# Patient Record
Sex: Female | Born: 1965 | Race: White | Hispanic: No | Marital: Married | Smoking: Never smoker
Health system: Southern US, Community
[De-identification: ages and names within clinical notes are randomized; demographics above are authoritative.]

## PROBLEM LIST (undated history)

## (undated) DIAGNOSIS — C50919 Malignant neoplasm of unspecified site of unspecified female breast: Secondary | ICD-10-CM

## (undated) DIAGNOSIS — F419 Anxiety disorder, unspecified: Secondary | ICD-10-CM

## (undated) DIAGNOSIS — R7303 Prediabetes: Secondary | ICD-10-CM

## (undated) DIAGNOSIS — M810 Age-related osteoporosis without current pathological fracture: Secondary | ICD-10-CM

## (undated) DIAGNOSIS — Z87442 Personal history of urinary calculi: Secondary | ICD-10-CM

## (undated) DIAGNOSIS — K219 Gastro-esophageal reflux disease without esophagitis: Secondary | ICD-10-CM

## (undated) DIAGNOSIS — C801 Malignant (primary) neoplasm, unspecified: Secondary | ICD-10-CM

## (undated) DIAGNOSIS — K5790 Diverticulosis of intestine, part unspecified, without perforation or abscess without bleeding: Secondary | ICD-10-CM

## (undated) DIAGNOSIS — J189 Pneumonia, unspecified organism: Secondary | ICD-10-CM

## (undated) DIAGNOSIS — D649 Anemia, unspecified: Secondary | ICD-10-CM

## (undated) DIAGNOSIS — G43909 Migraine, unspecified, not intractable, without status migrainosus: Secondary | ICD-10-CM

## (undated) DIAGNOSIS — K5792 Diverticulitis of intestine, part unspecified, without perforation or abscess without bleeding: Secondary | ICD-10-CM

## (undated) DIAGNOSIS — I82409 Acute embolism and thrombosis of unspecified deep veins of unspecified lower extremity: Secondary | ICD-10-CM

## (undated) DIAGNOSIS — N200 Calculus of kidney: Secondary | ICD-10-CM

## (undated) DIAGNOSIS — D689 Coagulation defect, unspecified: Secondary | ICD-10-CM

## (undated) DIAGNOSIS — E119 Type 2 diabetes mellitus without complications: Secondary | ICD-10-CM

## (undated) HISTORY — DX: Diverticulosis of intestine, part unspecified, without perforation or abscess without bleeding: K57.90

## (undated) HISTORY — DX: Coagulation defect, unspecified: D68.9

## (undated) HISTORY — DX: Migraine, unspecified, not intractable, without status migrainosus: G43.909

## (undated) HISTORY — PX: BREAST ENHANCEMENT SURGERY: SHX7

## (undated) HISTORY — DX: Malignant neoplasm of unspecified site of unspecified female breast: C50.919

## (undated) HISTORY — DX: Age-related osteoporosis without current pathological fracture: M81.0

## (undated) HISTORY — DX: Calculus of kidney: N20.0

## (undated) HISTORY — DX: Pneumonia, unspecified organism: J18.9

## (undated) HISTORY — DX: Diverticulitis of intestine, part unspecified, without perforation or abscess without bleeding: K57.92

## (undated) HISTORY — DX: Prediabetes: R73.03

## (undated) HISTORY — DX: Type 2 diabetes mellitus without complications: E11.9

## (undated) HISTORY — PX: BREAST SURGERY: SHX581

---

## 2000-04-02 HISTORY — PX: UMBILICAL HERNIA REPAIR: SHX196

## 2000-04-02 HISTORY — PX: HERNIA REPAIR: SHX51

## 2003-04-03 HISTORY — PX: BUNIONECTOMY: SHX129

## 2003-12-22 ENCOUNTER — Encounter: Admission: RE | Admit: 2003-12-22 | Discharge: 2003-12-22 | Payer: Self-pay | Admitting: Gastroenterology

## 2015-07-26 DIAGNOSIS — H5213 Myopia, bilateral: Secondary | ICD-10-CM | POA: Diagnosis not present

## 2015-09-22 DIAGNOSIS — K210 Gastro-esophageal reflux disease with esophagitis: Secondary | ICD-10-CM | POA: Diagnosis not present

## 2015-12-06 ENCOUNTER — Ambulatory Visit (INDEPENDENT_AMBULATORY_CARE_PROVIDER_SITE_OTHER): Payer: BLUE CROSS/BLUE SHIELD | Admitting: Physician Assistant

## 2015-12-06 ENCOUNTER — Encounter: Payer: Self-pay | Admitting: Physician Assistant

## 2015-12-06 VITALS — BP 125/80 | HR 73 | Temp 97.9°F | Ht 64.0 in | Wt 130.7 lb

## 2015-12-06 DIAGNOSIS — K9041 Non-celiac gluten sensitivity: Secondary | ICD-10-CM | POA: Insufficient documentation

## 2015-12-06 DIAGNOSIS — K219 Gastro-esophageal reflux disease without esophagitis: Secondary | ICD-10-CM | POA: Insufficient documentation

## 2015-12-06 DIAGNOSIS — J029 Acute pharyngitis, unspecified: Secondary | ICD-10-CM

## 2015-12-06 MED ORDER — AMOXICILLIN 500 MG PO CAPS: 1000.0000 mg | ORAL_CAPSULE | Freq: Two times a day (BID) | ORAL | 0 refills | Status: DC

## 2015-12-06 NOTE — Patient Instructions (Signed)

## 2015-12-06 NOTE — Progress Notes (Signed)
BP 125/80   Pulse 73   Temp 97.9 F (36.6 C) (Oral)   Ht 5\' 4"  (1.626 m)   Wt 130 lb 11.2 oz (59.3 kg)   BMI 22.43 kg/m    Subjective:    Patient ID: Stephanie Brewer, female    DOB: Apr 13, 1965, 50 y.o.   MRN: [id number]  HPI: Stephanie Brewer is a 50 y.o. female presenting on 12/06/2015 for Sore Throat; Ear Pain; and New Patient (Initial Visit) Patient here to be established as new patient at [facility].  This patient is known to me from [facility]. She has had this pharyngitis symptoms for over 3 days. She has a history of strep in the past. She gets this approximately 2 or 3 times a year. She denies any severe fever. There is significant amount of pain that radiates to the left ear. She states that it has hurt to sleep on her left side for several nights. She states she has had some postnasal drainage and sinus drainage. She denies any nausea vomiting or diarrhea. She also will be seen Dr. Britta Mccreedy soon for a GI checkup and probable scopes   Relevant past medical, surgical, family and social history reviewed and updated as indicated. Interim medical history since our last visit reviewed. Allergies and medications reviewed and updated. DATA REVIEWED: CHART IN EPIC  Review of Systems  Constitutional: Negative.  Negative for activity change, fatigue and fever.  HENT: Positive for ear pain, postnasal drip and sore throat.   Eyes: Negative.   Respiratory: Negative.  Negative for cough and wheezing.   Cardiovascular: Negative.  Negative for chest pain.  Gastrointestinal: Negative.  Negative for abdominal pain.  Endocrine: Negative.   Genitourinary: Negative.  Negative for dysuria.  Musculoskeletal: Negative.   Skin: Negative.   Neurological: Negative.     Per HPI unless specifically indicated above     Medication List       Accurate as of 12/06/15  4:49 PM. Always use your most recent med list.          amoxicillin 500 MG capsule Commonly  known as:  AMOXIL Take 2 capsules (1,000 mg total) by mouth 2 (two) times daily.          Objective:    BP 125/80   Pulse 73   Temp 97.9 F (36.6 C) (Oral)   Ht 5\' 4"  (1.626 m)   Wt 130 lb 11.2 oz (59.3 kg)   BMI 22.43 kg/m   Allergies  Allergen Reactions  . Caffeine     Rapid heart rate.  . Doxycycline     Racing heart.  Marland Kitchen Nexium [Esomeprazole]     Swollen throat    Wt Readings from Last 3 Encounters:  12/06/15 130 lb 11.2 oz (59.3 kg)    Physical Exam  Constitutional: She is oriented to person, place, and time. She appears well-developed and well-nourished.  HENT:  Head: Normocephalic and atraumatic.  Right Ear: Tympanic membrane, external ear and ear canal normal.  Left Ear: Ear canal normal. There is tenderness. A middle ear effusion is present.  Nose: Mucosal edema and rhinorrhea present. No nasal deformity.  Mouth/Throat: Uvula is midline and mucous membranes are normal. Posterior oropharyngeal erythema present.  Eyes: Conjunctivae and EOM are normal. Pupils are equal, round, and reactive to light.  Cardiovascular: Normal rate, regular rhythm, normal heart sounds and intact distal pulses.   Pulmonary/Chest: Effort normal and breath sounds normal.  Abdominal: Soft. Bowel sounds  are normal.  Neurological: She is alert and oriented to person, place, and time. She has normal reflexes.  Skin: Skin is warm and dry. No rash noted.  Psychiatric: She has a normal mood and affect. Her behavior is normal. Judgment and thought content normal.    No results found for this or any previous visit.    Assessment & Plan:   1. Acute pharyngitis, unspecified etiology Tylenol for pain or fever - amoxicillin (AMOXIL) 500 MG capsule; Take 2 capsules (1,000 mg total) by mouth 2 (two) times daily.  Dispense: 30 capsule; Refill: 0   Continue all other maintenance medications as listed above.  Follow up plan: No Follow-up on file.  Counseling provided for all of the age  appropriate vaccines. No orders of the defined types were placed in this encounter.   Terald Sleeper PA-C [facility] [address]  [place], [place] [address] [phone]   12/06/2015, 4:49 PM

## 2015-12-19 DIAGNOSIS — R1013 Epigastric pain: Secondary | ICD-10-CM | POA: Diagnosis not present

## 2015-12-30 DIAGNOSIS — Z91018 Allergy to other foods: Secondary | ICD-10-CM | POA: Diagnosis not present

## 2015-12-30 DIAGNOSIS — Z8489 Family history of other specified conditions: Secondary | ICD-10-CM | POA: Diagnosis not present

## 2015-12-30 DIAGNOSIS — Z79899 Other long term (current) drug therapy: Secondary | ICD-10-CM | POA: Diagnosis not present

## 2015-12-30 DIAGNOSIS — Z8249 Family history of ischemic heart disease and other diseases of the circulatory system: Secondary | ICD-10-CM | POA: Diagnosis not present

## 2015-12-30 DIAGNOSIS — Z91011 Allergy to milk products: Secondary | ICD-10-CM | POA: Diagnosis not present

## 2015-12-30 DIAGNOSIS — Z883 Allergy status to other anti-infective agents status: Secondary | ICD-10-CM | POA: Diagnosis not present

## 2015-12-30 DIAGNOSIS — R1013 Epigastric pain: Secondary | ICD-10-CM | POA: Diagnosis not present

## 2015-12-30 DIAGNOSIS — Z888 Allergy status to other drugs, medicaments and biological substances status: Secondary | ICD-10-CM | POA: Diagnosis not present

## 2016-01-16 ENCOUNTER — Encounter: Payer: Self-pay | Admitting: Physician Assistant

## 2016-01-30 DIAGNOSIS — R1013 Epigastric pain: Secondary | ICD-10-CM | POA: Diagnosis not present

## 2016-02-06 DIAGNOSIS — B079 Viral wart, unspecified: Secondary | ICD-10-CM | POA: Diagnosis not present

## 2016-04-03 DIAGNOSIS — L820 Inflamed seborrheic keratosis: Secondary | ICD-10-CM | POA: Diagnosis not present

## 2016-04-03 DIAGNOSIS — B079 Viral wart, unspecified: Secondary | ICD-10-CM | POA: Diagnosis not present

## 2016-04-03 DIAGNOSIS — L700 Acne vulgaris: Secondary | ICD-10-CM | POA: Diagnosis not present

## 2016-05-24 DIAGNOSIS — Z1231 Encounter for screening mammogram for malignant neoplasm of breast: Secondary | ICD-10-CM | POA: Diagnosis not present

## 2016-05-24 DIAGNOSIS — Z9882 Breast implant status: Secondary | ICD-10-CM | POA: Diagnosis not present

## 2016-07-05 DIAGNOSIS — H5213 Myopia, bilateral: Secondary | ICD-10-CM | POA: Diagnosis not present

## 2017-01-15 ENCOUNTER — Ambulatory Visit (INDEPENDENT_AMBULATORY_CARE_PROVIDER_SITE_OTHER): Payer: BLUE CROSS/BLUE SHIELD

## 2017-01-15 ENCOUNTER — Ambulatory Visit (INDEPENDENT_AMBULATORY_CARE_PROVIDER_SITE_OTHER): Payer: BLUE CROSS/BLUE SHIELD | Admitting: Physician Assistant

## 2017-01-15 ENCOUNTER — Encounter: Payer: Self-pay | Admitting: Physician Assistant

## 2017-01-15 VITALS — BP 120/82 | HR 75 | Temp 98.0°F | Ht 64.0 in | Wt 162.2 lb

## 2017-01-15 DIAGNOSIS — M25561 Pain in right knee: Secondary | ICD-10-CM | POA: Diagnosis not present

## 2017-01-15 DIAGNOSIS — Z0000 Encounter for general adult medical examination without abnormal findings: Secondary | ICD-10-CM

## 2017-01-15 DIAGNOSIS — R55 Syncope and collapse: Secondary | ICD-10-CM | POA: Diagnosis not present

## 2017-01-15 DIAGNOSIS — L237 Allergic contact dermatitis due to plants, except food: Secondary | ICD-10-CM

## 2017-01-15 MED ORDER — CLOBETASOL PROPIONATE 0.05 % EX CREA: 1.0000 "application " | TOPICAL_CREAM | Freq: Two times a day (BID) | CUTANEOUS | 0 refills | Status: DC

## 2017-01-15 NOTE — Patient Instructions (Signed)
In a few days you may receive a survey in the mail or online from [employer] regarding your visit with us today. Please take a moment to fill this out. Your feedback is very important to our whole office. It can help us better understand your needs as well as improve your experience and satisfaction. Thank you for taking your time to complete it. We care about you.  Willett Lefeber, PA-C  

## 2017-01-15 NOTE — Progress Notes (Signed)
BP 120/82   Pulse 75   Temp 98 F (36.7 C) (Oral)   Ht 5' 4"  (1.626 m)   Wt 162 lb 3.2 oz (73.6 kg)   BMI 27.84 kg/m    Subjective:    Patient ID: Stephanie Brewer, female    DOB: 05/14/1965, 51 y.o.   MRN: [id number]  HPI: Stephanie Brewer is a 51 y.o. female presenting on 01/15/2017 for Fall (3 weeks ago hurt right knee)  There is ago the patient found herself falling on her driveway. She notes that she remembered standing and then she woke up having pain in her right knee right shoulder right palm. She had a significant amount of bruising. The knee has been extremely swollen and painful at times. She has tried to nurse it with over-the-counter medications, ice and compression. But has continued to hurt. Plus she was concerned about why she had syncope. She had eaten that day. She does not remember having any palpitations or chest pain. She overall is very healthy and has not had any complaints. We will draw some labs today.  Relevant past medical, surgical, family and social history reviewed and updated as indicated. Allergies and medications reviewed and updated.  History reviewed. No pertinent past medical history.  Past Surgical History:  Procedure Laterality Date  . BREAST SURGERY     Augmentation 2003  . HERNIA REPAIR  2002    Review of Systems  Constitutional: Negative.  Negative for activity change, fatigue and fever.  HENT: Negative.   Eyes: Negative.   Respiratory: Negative.  Negative for cough.   Cardiovascular: Negative.  Negative for chest pain.  Gastrointestinal: Negative.  Negative for abdominal pain.  Endocrine: Negative.   Genitourinary: Negative.  Negative for dysuria.  Musculoskeletal: Positive for arthralgias, joint swelling and myalgias.  Skin: Negative.   Neurological: Positive for syncope. Negative for dizziness, tremors, seizures, speech difficulty and weakness.    Allergies as of 01/15/2017      Reactions   Hyoscyamine Anaphylaxis   Caffeine    Rapid heart rate.   Doxycycline    Racing heart.   Nexium [esomeprazole]    Swollen throat      Medication List       Accurate as of 01/15/17 11:17 AM. Always use your most recent med list.          clobetasol cream 0.05 % Commonly known as:  TEMOVATE Apply 1 application topically 2 (two) times daily.          Objective:    BP 120/82   Pulse 75   Temp 98 F (36.7 C) (Oral)   Ht 5' 4"  (1.626 m)   Wt 162 lb 3.2 oz (73.6 kg)   BMI 27.84 kg/m   Allergies  Allergen Reactions  . Hyoscyamine Anaphylaxis  . Caffeine     Rapid heart rate.  . Doxycycline     Racing heart.  Marland Kitchen Nexium [Esomeprazole]     Swollen throat    Physical Exam  Constitutional: She is oriented to person, place, and time. She appears well-developed and well-nourished.  HENT:  Head: Normocephalic and atraumatic.  Right Ear: Tympanic membrane, external ear and ear canal normal.  Left Ear: Tympanic membrane, external ear and ear canal normal.  Nose: Nose normal. No rhinorrhea.  Mouth/Throat: Oropharynx is clear and moist and mucous membranes are normal. No oropharyngeal exudate or posterior oropharyngeal erythema.  Eyes: Pupils are equal, round, and reactive to light. Conjunctivae and EOM are normal.  Neck: Normal range of motion. Neck supple.  Cardiovascular: Normal rate, regular rhythm, normal heart sounds and intact distal pulses.   Pulmonary/Chest: Effort normal and breath sounds normal.  Abdominal: Soft. Bowel sounds are normal.  Musculoskeletal:       Right knee: She exhibits decreased range of motion and effusion. Tenderness found. Patellar tendon tenderness noted.       Legs: Neurological: She is alert and oriented to person, place, and time. She has normal reflexes.  Skin: Skin is warm and dry. No rash noted.  Psychiatric: She has a normal mood and affect. Her behavior is normal. Judgment and thought content normal.    No results found for this or any previous visit.    Assessment &  Plan:   1. Acute pain of right knee - DG Knee 1-2 Views Right; Future  2. Syncope, unspecified syncope type - CBC with Differential/Platelet - CMP14+EGFR - Lipid panel - Thyroid Panel With TSH  3. Well adult exam - CBC with Differential/Platelet - CMP14+EGFR - Lipid panel - Thyroid Panel With TSH  4. Allergic contact dermatitis due to plants, except food - clobetasol cream (TEMOVATE) 0.05 %; Apply 1 application topically 2 (two) times daily.  Dispense: 30 g; Refill: 0    Current Outpatient Prescriptions:  .  clobetasol cream (TEMOVATE) 4.46 %, Apply 1 application topically 2 (two) times daily., Disp: 30 g, Rfl: 0 Continue all other maintenance medications as listed above.  Follow up plan: Follow-up as needed or worsening of symptoms. Call office for any issues.   Educational handout given for [place] PA-C [facility] [address]  [place], [place] [address] [phone]   01/15/2017, 11:17  AM

## 2017-01-16 LAB — CBC WITH DIFFERENTIAL/PLATELET
BASOS: 1 %
Basophils Absolute: 0 10*3/uL (ref 0.0–0.2)
EOS (ABSOLUTE): 0.2 10*3/uL (ref 0.0–0.4)
Eos: 3 %
HEMATOCRIT: 37 % (ref 34.0–46.6)
Hemoglobin: 12.1 g/dL (ref 11.1–15.9)
IMMATURE GRANS (ABS): 0 10*3/uL (ref 0.0–0.1)
Immature Granulocytes: 0 %
LYMPHS: 22 %
Lymphocytes Absolute: 1.4 10*3/uL (ref 0.7–3.1)
MCH: 25.7 pg — AB (ref 26.6–33.0)
MCHC: 32.7 g/dL (ref 31.5–35.7)
MCV: 79 fL (ref 79–97)
Monocytes Absolute: 0.4 10*3/uL (ref 0.1–0.9)
Monocytes: 7 %
NEUTROS ABS: 4.2 10*3/uL (ref 1.4–7.0)
Neutrophils: 67 %
PLATELETS: 304 10*3/uL (ref 150–379)
RBC: 4.7 x10E6/uL (ref 3.77–5.28)
RDW: 17.4 % — ABNORMAL HIGH (ref 12.3–15.4)
WBC: 6.2 10*3/uL (ref 3.4–10.8)

## 2017-01-16 LAB — CMP14+EGFR
A/G RATIO: 1.5 (ref 1.2–2.2)
ALBUMIN: 4.1 g/dL (ref 3.5–5.5)
ALT: 8 IU/L (ref 0–32)
AST: 13 IU/L (ref 0–40)
Alkaline Phosphatase: 93 IU/L (ref 39–117)
BILIRUBIN TOTAL: 0.2 mg/dL (ref 0.0–1.2)
BUN / CREAT RATIO: 12 (ref 9–23)
BUN: 10 mg/dL (ref 6–24)
CHLORIDE: 101 mmol/L (ref 96–106)
CO2: 25 mmol/L (ref 20–29)
Calcium: 9.3 mg/dL (ref 8.7–10.2)
Creatinine, Ser: 0.81 mg/dL (ref 0.57–1.00)
GFR calc non Af Amer: 84 mL/min/{1.73_m2} (ref >59)
GFR, EST AFRICAN AMERICAN: 97 mL/min/{1.73_m2} (ref >59)
GLOBULIN, TOTAL: 2.8 g/dL (ref 1.5–4.5)
Glucose: 82 mg/dL (ref 65–99)
POTASSIUM: 4.2 mmol/L (ref 3.5–5.2)
SODIUM: 141 mmol/L (ref 134–144)
TOTAL PROTEIN: 6.9 g/dL (ref 6.0–8.5)

## 2017-01-16 LAB — LIPID PANEL
CHOL/HDL RATIO: 2.3 ratio (ref 0.0–4.4)
Cholesterol, Total: 169 mg/dL (ref 100–199)
HDL: 73 mg/dL (ref >39)
LDL Calculated: 83 mg/dL (ref 0–99)
Triglycerides: 67 mg/dL (ref 0–149)
VLDL Cholesterol Cal: 13 mg/dL (ref 5–40)

## 2017-01-16 LAB — THYROID PANEL WITH TSH
FREE THYROXINE INDEX: 1.8 (ref 1.2–4.9)
T3 UPTAKE RATIO: 25 % (ref 24–39)
T4 TOTAL: 7 ug/dL (ref 4.5–12.0)
TSH: 2.24 u[IU]/mL (ref 0.450–4.500)

## 2017-02-12 DIAGNOSIS — M79672 Pain in left foot: Secondary | ICD-10-CM | POA: Diagnosis not present

## 2017-02-12 DIAGNOSIS — M7742 Metatarsalgia, left foot: Secondary | ICD-10-CM | POA: Diagnosis not present

## 2017-03-05 DIAGNOSIS — M7742 Metatarsalgia, left foot: Secondary | ICD-10-CM | POA: Diagnosis not present

## 2017-03-05 DIAGNOSIS — M79672 Pain in left foot: Secondary | ICD-10-CM | POA: Diagnosis not present

## 2017-03-05 DIAGNOSIS — G5762 Lesion of plantar nerve, left lower limb: Secondary | ICD-10-CM | POA: Diagnosis not present

## 2017-04-18 ENCOUNTER — Ambulatory Visit: Payer: BLUE CROSS/BLUE SHIELD | Admitting: Family Medicine

## 2017-04-18 ENCOUNTER — Encounter: Payer: Self-pay | Admitting: Family Medicine

## 2017-04-18 VITALS — BP 127/87 | HR 98 | Temp 99.4°F | Ht 64.0 in | Wt 170.0 lb

## 2017-04-18 DIAGNOSIS — R52 Pain, unspecified: Secondary | ICD-10-CM

## 2017-04-18 DIAGNOSIS — R6889 Other general symptoms and signs: Secondary | ICD-10-CM | POA: Diagnosis not present

## 2017-04-18 LAB — VERITOR FLU A/B WAIVED
Influenza A: NEGATIVE
Influenza B: NEGATIVE

## 2017-04-18 MED ORDER — AMOXICILLIN-POT CLAVULANATE 875-125 MG PO TABS
1.0000 | ORAL_TABLET | Freq: Two times a day (BID) | ORAL | 0 refills | Status: DC
Start: 2017-04-18 — End: 2017-09-16

## 2017-04-18 NOTE — Progress Notes (Signed)
Subjective: CC: Flulike symptoms PCP: Terald Sleeper, PA-C Stephanie Brewer is a 52 y.o. female presenting to clinic today for:  1.  Flulike symptoms Patient reports onset of subjective fevers, chills, myalgia and mild cough that is nonproductive yesterday evening.  She reports a sick contact at work.  She denies nausea, vomiting, diarrhea, rhinorrhea, sinus pressure, headache, rash.  She does note a history of recurrent strep throat but denies having any sore throat at this time.  She is taking fluids without difficulty.  She is planning on leaving for a trip tomorrow morning and is worried about potentially worsening.  For this reason, she came in for evaluation.   ROS: Per HPI  Allergies  Allergen Reactions  . Hyoscyamine Anaphylaxis  . Caffeine     Rapid heart rate.  . Doxycycline     Racing heart.  Marland Kitchen Nexium [Esomeprazole]     Swollen throat   No past medical history on file.  Current Outpatient Medications:  .  clobetasol cream (TEMOVATE) 3.32 %, Apply 1 application topically 2 (two) times daily., Disp: 30 g, Rfl: 0 Social History   Socioeconomic History  . Marital status: Married    Spouse name: Not on file  . Number of children: Not on file  . Years of education: Not on file  . Highest education level: Not on file  Social Needs  . Financial resource strain: Not on file  . Food insecurity - worry: Not on file  . Food insecurity - inability: Not on file  . Transportation needs - medical: Not on file  . Transportation needs - non-medical: Not on file  Occupational History  . Not on file  Tobacco Use  . Smoking status: Never Smoker  . Smokeless tobacco: Never Used  Substance and Sexual Activity  . Alcohol use: Yes    Comment: six drinks per year  . Drug use: No  . Sexual activity: Yes    Birth control/protection: IUD  Other Topics Concern  . Not on file  Social History Narrative  . Not on file   Family History  Problem Relation Age of Onset  .  Hypertension Father     Objective: Office vital signs reviewed. BP 127/87   Pulse 98   Temp 99.4 F (37.4 C) (Oral)   Ht 5\' 4"  (1.626 m)   Wt 170 lb (77.1 kg)   BMI 29.18 kg/m   Physical Examination:  General: Awake, alert, well nourished, nontoxic, no acute distress HEENT: Normal    Neck: No masses palpated. No lymphadenopathy    Ears: Tympanic membranes intact, normal light reflex, no erythema, no bulging    Eyes: PERRLA, extraocular membranes intact, sclera white, no ocular discharge    Nose: nasal turbinates moist, no nasal discharge    Throat: moist mucus membranes, no erythema, no tonsillar exudate.  Tonsils not enlarged.  Airway is patent Cardio: regular rate and rhythm, S1S2 heard, no murmurs appreciated Pulm: clear to auscultation bilaterally, no wheezes, rhonchi or rales; normal work of breathing on room air  Assessment/ Plan: 52 y.o. female   1. Flu-like symptoms Rapid flu was negative.  I suspect this is a viral URI.  However, because she is going out of town, she was given a pocket prescription for Augmentin.  Instructions for use were reviewed with the patient.  She voiced good understanding.  In the interim, supportive care was recommended.  Push oral fluids.  May use NSAIDs or Tylenol if needed for fever or myalgia.  Handout provided.  She will follow-up as needed. - Rapid Strep Screen (Not at [facility])  2. Body aches - Veritor Flu A/B Waived   Orders Placed This Encounter  Procedures  . Rapid Strep Screen (Not at [facility])  . Veritor Flu A/B Waived    Order Specific Question:   Source    Answer:   nasal   Meds ordered this encounter  Medications  . amoxicillin-clavulanate (AUGMENTIN) 875-125 MG tablet    Sig: Take 1 tablet by mouth 2 (two) times daily.    Dispense:  20 tablet    Refill:  [place], DO [place] [phone]

## 2017-04-18 NOTE — Patient Instructions (Signed)
As we discussed, this appears to be a viral upper respiratory infection.  There is nothing on your exam to suggest bacterial illness at this time.  However, given your history of recurrent strep throat and the fact that you are going out of town, I have given you a pocket prescription of Augmentin to use twice a day if needed for 10 days.  It appears that you have a viral upper respiratory infection (cold).  Cold symptoms can last up to 2 weeks.    - Get plenty of rest and drink plenty of fluids. - Try to breathe moist air. Use a cold mist humidifier. - Consume warm fluids (soup or tea) to provide relief for a stuffy nose and to loosen phlegm. - For nasal stuffiness, try saline nasal spray or a Neti Pot. Afrin nasal spray can also be used but this product should not be used longer than 3 days or it will cause rebound nasal stuffiness (worsening nasal congestion). - For sore throat pain relief: suck on throat lozenges, hard candy or popsicles; gargle with warm salt water (1/4 tsp. salt per 8 oz. of water); and eat soft, bland foods. - Eat a well-balanced diet. If you cannot, ensure you are getting enough nutrients by taking a daily multivitamin. - Avoid dairy products, as they can thicken phlegm. - Avoid alcohol, as it impairs your body's immune system.  CONTACT YOUR DOCTOR IF YOU EXPERIENCE ANY OF THE FOLLOWING: - High fever - Ear pain - Sinus-type headache - Unusually severe cold symptoms - Cough that gets worse while other cold symptoms improve - Flare up of any chronic lung problem, such as asthma - Your symptoms persist longer than 2 weeks

## 2017-04-23 ENCOUNTER — Ambulatory Visit: Payer: BLUE CROSS/BLUE SHIELD | Admitting: Family Medicine

## 2017-04-23 VITALS — BP 120/85 | HR 82 | Temp 98.2°F | Ht >= 80 in | Wt 164.0 lb

## 2017-04-23 DIAGNOSIS — J069 Acute upper respiratory infection, unspecified: Secondary | ICD-10-CM

## 2017-04-23 MED ORDER — BENZONATATE 200 MG PO CAPS: 200.0000 mg | ORAL_CAPSULE | Freq: Two times a day (BID) | ORAL | 0 refills | Status: DC | PRN

## 2017-04-23 MED ORDER — GUAIFENESIN-CODEINE 100-10 MG/5ML PO SOLN: 5.0000 mL | Freq: Four times a day (QID) | ORAL | 0 refills | Status: DC | PRN

## 2017-04-23 NOTE — Patient Instructions (Signed)
Continue taking the antibiotics as you have started.  I have sent in to cough medications.  The benzonatate is safe to take at work and you may drive with it.  The guaifenesin with codeine should only be taken at home.  Do not operate heavy machinery while on this medication, as it can impair your ability to concentrate/drive.  Symptoms can last up to 2 weeks.    - Get plenty of rest and drink plenty of fluids. - Try to breathe moist air. Use a cold mist humidifier. - Consume warm fluids (soup or tea) to provide relief for a stuffy nose and to loosen phlegm. - For nasal stuffiness, try saline nasal spray or a Neti Pot. Afrin nasal spray can also be used but this product should not be used longer than 3 days or it will cause rebound nasal stuffiness (worsening nasal congestion). - For sore throat pain relief: suck on throat lozenges, hard candy or popsicles; gargle with warm salt water (1/4 tsp. salt per 8 oz. of water); and eat soft, bland foods. - Eat a well-balanced diet. If you cannot, ensure you are getting enough nutrients by taking a daily multivitamin. - Avoid dairy products, as they can thicken phlegm. - Avoid alcohol, as it impairs your body's immune system.  CONTACT YOUR DOCTOR IF YOU EXPERIENCE ANY OF THE FOLLOWING: - High fever - Ear pain - Sinus-type headache - Unusually severe cold symptoms - Cough that gets worse while other cold symptoms improve - Flare up of any chronic lung problem, such as asthma - Your symptoms persist longer than 2 weeks

## 2017-04-23 NOTE — Progress Notes (Signed)
Subjective: CC: sinus pressure, fatigue PCP: Terald Sleeper, PA-C Stephanie Brewer is a 52 y.o. female presenting to clinic today for:  Patient was seen on 04/18/2017 for URI symptoms.  She reports that symptoms are persistent and possibly a little worse now.  She notes she started the Augmentin which was given to her as a pocket prescription, as her exam was not consistent with bacterial infection at last visit.  She notes that symptoms have not improved.  She denies fevers, chills.  She reports continuing to be achy and tired.  Coughing has been a little bit worse.  She felt like she had one episode of hemoptysis, which she describes as scant traces of blood and clear sputum last Thursday.  She has had no shortness of breath, chest pain, heart palpitations.  Sore throat has resolved.  She is tolerating p.o. without difficulty.  She has been using ibuprofen for aching.   ROS: Per HPI  Allergies  Allergen Reactions  . Hyoscyamine Anaphylaxis  . Caffeine     Rapid heart rate.  . Doxycycline     Racing heart.  Marland Kitchen Nexium [Esomeprazole]     Swollen throat   No past medical history on file.  Current Outpatient Medications:  .  amoxicillin-clavulanate (AUGMENTIN) 875-125 MG tablet, Take 1 tablet by mouth 2 (two) times daily., Disp: 20 tablet, Rfl: 0 .  benzonatate (TESSALON) 200 MG capsule, Take 1 capsule (200 mg total) by mouth 2 (two) times daily as needed for cough., Disp: 20 capsule, Rfl: 0 .  clobetasol cream (TEMOVATE) 4.85 %, Apply 1 application topically 2 (two) times daily., Disp: 30 g, Rfl: 0 .  guaiFENesin-codeine 100-10 MG/5ML syrup, Take 5 mLs by mouth every 6 (six) hours as needed for cough., Disp: 120 mL, Rfl: 0 Social History   Socioeconomic History  . Marital status: Married    Spouse name: Not on file  . Number of children: Not on file  . Years of education: Not on file  . Highest education level: Not on file  Social Needs  . Financial resource strain: Not on file    . Food insecurity - worry: Not on file  . Food insecurity - inability: Not on file  . Transportation needs - medical: Not on file  . Transportation needs - non-medical: Not on file  Occupational History  . Not on file  Tobacco Use  . Smoking status: Never Smoker  . Smokeless tobacco: Never Used  Substance and Sexual Activity  . Alcohol use: Yes    Comment: six drinks per year  . Drug use: No  . Sexual activity: Yes    Birth control/protection: IUD  Other Topics Concern  . Not on file  Social History Narrative  . Not on file   Family History  Problem Relation Age of Onset  . Hypertension Father     Objective: Office vital signs reviewed. BP 120/85   Pulse 82   Temp 98.2 F (36.8 C) (Oral)   Ht 7' (2.134 m)   Wt 164 lb (74.4 kg)   BMI 16.34 kg/m   Physical Examination:  General: Awake, alert, well nourished, well appearing female, No acute distress HEENT: Normal    Neck: No masses palpated. No lymphadenopathy    Ears: Tympanic membranes intact, normal light reflex, no erythema, no bulging    Eyes: PERRLA, extraocular membranes intact, sclera white, no ocular discharge    Nose: nasal turbinates moist, clear nasal discharge    Throat: moist mucus  membranes, no erythema, no tonsillar exudate.  Airway is patent Cardio: regular rate and rhythm, S1S2 heard, no murmurs appreciated Pulm: clear to auscultation bilaterally, no wheezes, rhonchi or rales; normal work of breathing on room air  Assessment/ Plan: 52 y.o. female   1. URI with cough and congestion Again, I think this is a viral URI.  However, because she started the Augmentin difficult to tell if this at any point in time turned into a bacterial URI.  For this reason, I recommended that she continue the antibiotics that she has started it.  I have added Tessalon for use during the day and Robitussin-AC for use during the night. The Narcotic Database has been reviewed.  There were no red flags.   I recommend that she  use no more than 600 mg of ibuprofen every 8 hours.  Push oral fluids.  Get plenty of rest.  Continue supportive care as outlined in AVS.  A work note was provided.  Patient to follow-up as needed.  Meds ordered this encounter  Medications  . guaiFENesin-codeine 100-10 MG/5ML syrup    Sig: Take 5 mLs by mouth every 6 (six) hours as needed for cough.    Dispense:  120 mL    Refill:  0  . benzonatate (TESSALON) 200 MG capsule    Sig: Take 1 capsule (200 mg total) by mouth 2 (two) times daily as needed for cough.    Dispense:  20 capsule    Refill:  [place], DO [place] [phone]

## 2017-04-25 ENCOUNTER — Encounter: Payer: Self-pay | Admitting: Family Medicine

## 2017-04-25 ENCOUNTER — Ambulatory Visit: Payer: BLUE CROSS/BLUE SHIELD | Admitting: Family Medicine

## 2017-04-25 VITALS — BP 118/81 | HR 82 | Temp 98.9°F | Wt 165.0 lb

## 2017-04-25 DIAGNOSIS — B09 Unspecified viral infection characterized by skin and mucous membrane lesions: Secondary | ICD-10-CM

## 2017-04-25 NOTE — Progress Notes (Signed)
BP 118/81   Pulse 82   Temp 98.9 F (37.2 C) (Oral)   Wt 165 lb (74.8 kg)   BMI 16.44 kg/m    Subjective:    Patient ID: Stephanie Brewer, female    DOB: Jul 31, 1965, 52 y.o.   MRN: [id number]  HPI: Stephanie Brewer is a 52 y.o. female presenting on 04/25/2017 for Rash (chest and abdomen, some on right arm, does not itch, woke up with it this morning, was seen by Dr. Darnell Level  twice in last week, taking Tessalon and Guaifenesin with codeine)   HPI Rash Patient comes in with a persistent cold that she still fighting for the past 8 days with sinus pressure and congestion that she has been treated for with both amoxicillin and cough medication.  She says that what brings her in today is she is developed a new rash that just started this morning.  The rash started on her upper chest and is now spread over most of her chest and abdomen and parts of her upper arms on both sides.  She denies any fevers or chills or redness or drainage or pain with the rash.  She denies it being pruritic.  The rash has not been moving or changing but is just been since gradually spreading.  She had been given him Augmentin last week but she had stopped it a couple days ago.  She had been given new Tessalon Perles and cough syrup with codeine.  Relevant past medical, surgical, family and social history reviewed and updated as indicated. Interim medical history since our last visit reviewed. Allergies and medications reviewed and updated.  Review of Systems  Constitutional: Negative for chills and fever.  HENT: Positive for congestion, postnasal drip, rhinorrhea, sinus pressure, sneezing and sore throat. Negative for ear discharge and ear pain.   Eyes: Negative for pain, redness and visual disturbance.  Respiratory: Positive for cough. Negative for chest tightness and shortness of breath.   Cardiovascular: Negative for chest pain and leg swelling.  Genitourinary: Negative for difficulty urinating and dysuria.    Musculoskeletal: Negative for back pain and gait problem.  Skin: Positive for rash.  Neurological: Negative for light-headedness and headaches.  Psychiatric/Behavioral: Negative for agitation and behavioral problems.  All other systems reviewed and are negative.   Per HPI unless specifically indicated above   Allergies as of 04/25/2017      Reactions   Hyoscyamine Anaphylaxis   Caffeine    Rapid heart rate.   Doxycycline    Racing heart.   Nexium [esomeprazole]    Swollen throat      Medication List        Accurate as of 04/25/17 11:21 AM. Always use your most recent med list.          amoxicillin-clavulanate 875-125 MG tablet Commonly known as:  AUGMENTIN Take 1 tablet by mouth 2 (two) times daily.   benzonatate 200 MG capsule Commonly known as:  TESSALON Take 1 capsule (200 mg total) by mouth 2 (two) times daily as needed for cough.   clobetasol cream 0.05 % Commonly known as:  TEMOVATE Apply 1 application topically 2 (two) times daily.   guaiFENesin-codeine 100-10 MG/5ML syrup Take 5 mLs by mouth every 6 (six) hours as needed for cough.          Objective:    BP 118/81   Pulse 82   Temp 98.9 F (37.2 C) (Oral)   Wt 165 lb (74.8 kg)   BMI 16.44  kg/m   Wt Readings from Last 3 Encounters:  04/25/17 165 lb (74.8 kg)  04/23/17 164 lb (74.4 kg)  04/18/17 170 lb (77.1 kg)    Physical Exam  Constitutional: She is oriented to person, place, and time. She appears well-developed and well-nourished. No distress.  HENT:  Right Ear: Tympanic membrane, external ear and ear canal normal.  Left Ear: Tympanic membrane, external ear and ear canal normal.  Nose: Mucosal edema and rhinorrhea present. No epistaxis. Right sinus exhibits no maxillary sinus tenderness and no frontal sinus tenderness. Left sinus exhibits no maxillary sinus tenderness and no frontal sinus tenderness.  Mouth/Throat: Uvula is midline and mucous membranes are normal. Posterior oropharyngeal  edema and posterior oropharyngeal erythema present. No oropharyngeal exudate or tonsillar abscesses.  Eyes: Conjunctivae and EOM are normal.  Cardiovascular: Normal rate, regular rhythm, normal heart sounds and intact distal pulses.  No murmur heard. Pulmonary/Chest: Effort normal and breath sounds normal. No respiratory distress. She has no wheezes. She has no rales.  Musculoskeletal: Normal range of motion. She exhibits no edema or tenderness.  Neurological: She is alert and oriented to person, place, and time. Coordination normal.  Skin: Skin is warm and dry. No rash noted. She is not diaphoretic.  Psychiatric: She has a normal mood and affect. Her behavior is normal.  Nursing note and vitals reviewed.     Assessment & Plan:   Problem List Items Addressed This Visit    None    Visit Diagnoses    Viral exanthem    -  Primary     Supportive treatment and use Flonase to help with symptoms from cold.  I do not believe it is a reaction to any of her medications.  Follow up plan: Return if symptoms worsen or fail to improve.  Counseling provided for all of the vaccine components No orders of the defined types were placed in this encounter.   Caryl Pina, MD [facility] 04/25/2017, 11:21 AM

## 2017-06-03 DIAGNOSIS — Z1231 Encounter for screening mammogram for malignant neoplasm of breast: Secondary | ICD-10-CM | POA: Diagnosis not present

## 2017-06-03 DIAGNOSIS — Z9882 Breast implant status: Secondary | ICD-10-CM | POA: Diagnosis not present

## 2017-07-22 DIAGNOSIS — D1720 Benign lipomatous neoplasm of skin and subcutaneous tissue of unspecified limb: Secondary | ICD-10-CM | POA: Diagnosis not present

## 2017-07-22 DIAGNOSIS — D1801 Hemangioma of skin and subcutaneous tissue: Secondary | ICD-10-CM | POA: Diagnosis not present

## 2017-07-22 DIAGNOSIS — L718 Other rosacea: Secondary | ICD-10-CM | POA: Diagnosis not present

## 2017-08-05 DIAGNOSIS — L700 Acne vulgaris: Secondary | ICD-10-CM | POA: Diagnosis not present

## 2017-08-05 DIAGNOSIS — Z79899 Other long term (current) drug therapy: Secondary | ICD-10-CM | POA: Diagnosis not present

## 2017-08-13 DIAGNOSIS — H5213 Myopia, bilateral: Secondary | ICD-10-CM | POA: Diagnosis not present

## 2017-09-06 DIAGNOSIS — L718 Other rosacea: Secondary | ICD-10-CM | POA: Diagnosis not present

## 2017-09-16 ENCOUNTER — Ambulatory Visit: Payer: BLUE CROSS/BLUE SHIELD | Admitting: Family Medicine

## 2017-09-16 ENCOUNTER — Encounter: Payer: Self-pay | Admitting: Family Medicine

## 2017-09-16 VITALS — BP 127/88 | HR 74 | Temp 98.6°F | Ht 64.0 in | Wt 175.0 lb

## 2017-09-16 DIAGNOSIS — M5442 Lumbago with sciatica, left side: Secondary | ICD-10-CM

## 2017-09-16 MED ORDER — DICLOFENAC SODIUM 75 MG PO TBEC: 75.0000 mg | DELAYED_RELEASE_TABLET | Freq: Two times a day (BID) | ORAL | 0 refills | Status: DC | PRN

## 2017-09-16 MED ORDER — TIZANIDINE HCL 4 MG PO TABS: 2.0000 mg | ORAL_TABLET | Freq: Four times a day (QID) | ORAL | 0 refills | Status: DC | PRN

## 2017-09-16 NOTE — Patient Instructions (Signed)
You likely have a small bulging disc causing your pain.  About 30% of these will resolve independently.  About 30% require medication and physical therapy and about 30% require surgical intervention.  Typically, we treat you conservatively with medication and physical therapy in the absence of red flag symptoms which we discussed during your visit.  If your symptoms are not substantially improving in the next 7 to 10 days, please contact me and I will set up physical therapy for you.  I have also sent you home with some home physical therapy to perform.  I would start this may be in the next couple of days.  Certainly keep active to reduce muscle spasm.  If you develop any other worrisome symptoms or signs that we discussed, please seek immediate medical attention the emergency department.

## 2017-09-16 NOTE — Progress Notes (Signed)
Subjective: CC: back pain PCP: Terald Sleeper, PA-C HPI: Patient is a 52 y.o. female presenting to clinic today for back pain. Concerns today include:  1. Back Pain Patient reports that low back pain began Saturday.  Left side of the low back seems to be worse than right.  She reports that she has had sciatica on the left side in the past.  Pain is a 7/10 (she notes that she has a very high threshold for pain and that vaginal birth was a 10/10).  It does radiate to the left lower extremity greater than right lower extremity.  Lying flat worsens pain.  Hyperflexing the lower extremities her chest improves pain.  Patient has been taking OTC medications and applying heat for pain with minimal relief.  Patient denies trauma or injury.  No dysuria, hematuria, fevers, chills, nausea, vomiting, abdominal pain, renal stones.   No saddle anesthesia, urinary retention/incontinence, bowel incontinence, weakness, falls, sensation changes or pain anywhere else. No h/o back surgeries.    Current Outpatient Medications:  Marland Kitchen  Minocycline HCl 90 MG TB24, Take 1 tablet by mouth daily., Disp: , Rfl:  Allergies  Allergen Reactions  . Hyoscyamine Anaphylaxis  . Caffeine     Rapid heart rate.  . Doxycycline     Racing heart.  Marland Kitchen Nexium [Esomeprazole]     Swollen throat    No past medical history on file. Social History   Socioeconomic History  . Marital status: Married    Spouse name: Not on file  . Number of children: Not on file  . Years of education: Not on file  . Highest education level: Not on file  Occupational History  . Not on file  Social Needs  . Financial resource strain: Not on file  . Food insecurity:    Worry: Not on file    Inability: Not on file  . Transportation needs:    Medical: Not on file    Non-medical: Not on file  Tobacco Use  . Smoking status: Never Smoker  . Smokeless tobacco: Never Used  Substance and Sexual Activity  . Alcohol use: Yes    Comment: six drinks  per year  . Drug use: No  . Sexual activity: Yes    Birth control/protection: IUD  Lifestyle  . Physical activity:    Days per week: Not on file    Minutes per session: Not on file  . Stress: Not on file  Relationships  . Social connections:    Talks on phone: Not on file    Gets together: Not on file    Attends religious service: Not on file    Active member of club or organization: Not on file    Attends meetings of clubs or organizations: Not on file    Relationship status: Not on file  . Intimate partner violence:    Fear of current or ex partner: Not on file    Emotionally abused: Not on file    Physically abused: Not on file    Forced sexual activity: Not on file  Other Topics Concern  . Not on file  Social History Narrative  . Not on file   Past Surgical History:  Procedure Laterality Date  . BREAST SURGERY     Augmentation 2003  . HERNIA REPAIR  2002    ROS: per HPI  Objective: Office vital signs reviewed. BP 127/88   Pulse 74   Temp 98.6 F (37 C) (Oral)   Ht 5'  4" (1.626 m)   Wt 175 lb (79.4 kg)   BMI 30.04 kg/m   Physical Examination:  General: Awake, alert, well nourished, NAD Cardio: Regular rate, +2 DP Pulm: Normal work of breathing on room air Extremities: Warm, well-perfused. No edema, cyanosis or clubbing; +2 pulses bilaterally MSK: antalgic gait and hunched station  Lumbar Spine: Has slightly reduced AROM in flexion secondary to pain.  She has significant pain with extension of the spine and rotation to the left.  Rotation to the right is preserved.,  No midline tenderness to palpation, moderate paraspinal tenderness to palpation particularly over the lumbosacral junction on the left.  No palpable bony deformities, pain with straight leg test on left Neuro: 5/5 lower extremity strength; lower extremity light touch sensation grossly intact, patellar DTRs 2 / 4 bilaterally.  Assessment/ Plan: RUTHY FORRY is a 52 y.o. female here with  1.  Acute left-sided low back pain with left-sided sciatica More of acute on chronic presentation given history of left-sided sciatica in the past.  Her physical exam was remarkable for decreased active range of motion secondary to pain.  She did have pain with straight leg testing on the left.  No neurologic deficits.  Strength is preserved.  Given acute onset, we will treat with oral NSAIDs, muscle relaxer and home physical therapy.  Medications were sent to pharmacy and I reviewed instructions for use.  Avoid other NSAIDs.  Caution sedation with a muscle relaxer.  Patient declined work note today but I did encourage her to call me if symptoms were to worsen after working today.  If symptoms are not improving substantially in the next 7 to 10 days could consider formal referral to physical therapy versus orthopedics.  Strict return precautions and reasons for emergent evaluation the emergency department discussed.  Patient was good understanding of follow-up as needed.  Meds ordered this encounter  Medications  . diclofenac (VOLTAREN) 75 MG EC tablet    Sig: Take 1 tablet (75 mg total) by mouth 2 (two) times daily as needed.    Dispense:  30 tablet    Refill:  0  . tiZANidine (ZANAFLEX) 4 MG tablet    Sig: Take 0.5-1 tablets (2-4 mg total) by mouth every 6 (six) hours as needed for muscle spasms.    Dispense:  30 tablet    Refill:  0     Taeko Schaffer Windell Moulding, DO [place]

## 2017-12-24 ENCOUNTER — Ambulatory Visit: Payer: BLUE CROSS/BLUE SHIELD | Admitting: Physician Assistant

## 2017-12-24 ENCOUNTER — Encounter: Payer: Self-pay | Admitting: Physician Assistant

## 2017-12-24 VITALS — BP 121/79 | HR 71 | Temp 98.2°F | Ht 64.0 in | Wt 181.6 lb

## 2017-12-24 DIAGNOSIS — L659 Nonscarring hair loss, unspecified: Secondary | ICD-10-CM | POA: Diagnosis not present

## 2017-12-24 DIAGNOSIS — Z9882 Breast implant status: Secondary | ICD-10-CM | POA: Diagnosis not present

## 2017-12-24 DIAGNOSIS — R635 Abnormal weight gain: Secondary | ICD-10-CM | POA: Diagnosis not present

## 2017-12-24 DIAGNOSIS — T8542XA Displacement of breast prosthesis and implant, initial encounter: Secondary | ICD-10-CM | POA: Insufficient documentation

## 2017-12-24 DIAGNOSIS — D649 Anemia, unspecified: Secondary | ICD-10-CM

## 2017-12-24 DIAGNOSIS — T8542XD Displacement of breast prosthesis and implant, subsequent encounter: Secondary | ICD-10-CM

## 2017-12-24 HISTORY — DX: Displacement of breast prosthesis and implant, initial encounter: T85.42XA

## 2017-12-24 NOTE — Patient Instructions (Signed)
Hypothyroidism Hypothyroidism is a disorder of the thyroid. The thyroid is a large gland that is located in the lower front of the neck. The thyroid releases hormones that control how the body works. With hypothyroidism, the thyroid does not make enough of these hormones. What are the causes? Causes of hypothyroidism may include:  Viral infections.  Pregnancy.  Your own defense system (immune system) attacking your thyroid.  Certain medicines.  Birth defects.  Past radiation treatments to your head or neck.  Past treatment with radioactive iodine.  Past surgical removal of part or all of your thyroid.  Problems with the gland that is located in the center of your brain (pituitary).  What are the signs or symptoms? Signs and symptoms of hypothyroidism may include:  Feeling as though you have no energy (lethargy).  Inability to tolerate cold.  Weight gain that is not explained by a change in diet or exercise habits.  Dry skin.  Coarse hair.  Menstrual irregularity.  Slowing of thought processes.  Constipation.  Sadness or depression.  How is this diagnosed? Your health care provider may diagnose hypothyroidism with blood tests and ultrasound tests. How is this treated? Hypothyroidism is treated with medicine that replaces the hormones that your body does not make. After you begin treatment, it may take several weeks for symptoms to go away. Follow these instructions at home:  Take medicines only as directed by your health care provider.  If you start taking any new medicines, tell your health care provider.  Keep all follow-up visits as directed by your health care provider. This is important. As your condition improves, your dosage needs may change. You will need to have blood tests regularly so that your health care provider can watch your condition. Contact a health care provider if:  Your symptoms do not get better with treatment.  You are taking thyroid  replacement medicine and: ? You sweat excessively. ? You have tremors. ? You feel anxious. ? You lose weight rapidly. ? You cannot tolerate heat. ? You have emotional swings. ? You have diarrhea. ? You feel weak. Get help right away if:  You develop chest pain.  You develop an irregular heartbeat.  You develop a rapid heartbeat. This information is not intended to replace advice given to you by your health care provider. Make sure you discuss any questions you have with your health care provider. Document Released: 03/19/2005 Document Revised: 08/25/2015 Document Reviewed: 08/04/2013 Elsevier Interactive Patient Education  2018 Elsevier Inc.  

## 2017-12-24 NOTE — Progress Notes (Signed)
Subjective: CC: Overall that all PCP: Terald Sleeper, PA-C Stephanie Brewer is a 52 y.o. female presenting to clinic today for:  A wide array of symptoms.  She has had significant hair loss and changes in how her hair has excepted color.  Her hairdresser has noted the difference.  She also has had a history of anemia in the past and her B12 is been low in the past.  She is starting to have a little bit of gastrointestinal issues but not constantly.  She has had problems with food allergies in the past.  She is usually able to control this very well with her diet.  She also states she has had a weight gain in which she is not able to lose weight.  Normally she is able to change her diet and it will come off.  But for several months now she has had no improvement of this.  Review of Systems  Constitutional: Positive for malaise/fatigue. Negative for weight loss.  HENT: Negative.   Respiratory: Negative.   Cardiovascular: Negative.   Gastrointestinal: Positive for abdominal pain and diarrhea. Negative for blood in stool and melena.  Genitourinary: Negative.   Musculoskeletal: Positive for myalgias.  Skin: Negative.   Neurological: Positive for weakness and headaches. Negative for tingling, tremors and focal weakness.  Psychiatric/Behavioral: Negative.      Allergies  Allergen Reactions  . Hyoscyamine Anaphylaxis  . Caffeine     Rapid heart rate.  . Doxycycline     Racing heart.  Marland Kitchen Nexium [Esomeprazole]     Swollen throat   History reviewed. No pertinent past medical history.  Current Outpatient Medications:  Marland Kitchen  Minocycline HCl 90 MG TB24, Take 1 tablet by mouth daily., Disp: , Rfl:  Social History   Socioeconomic History  . Marital status: Married    Spouse name: Not on file  . Number of children: Not on file  . Years of education: Not on file  . Highest education level: Not on file  Occupational History  . Not on file  Social Needs  . Financial resource strain: Not  on file  . Food insecurity:    Worry: Not on file    Inability: Not on file  . Transportation needs:    Medical: Not on file    Non-medical: Not on file  Tobacco Use  . Smoking status: Never Smoker  . Smokeless tobacco: Never Used  Substance and Sexual Activity  . Alcohol use: Yes    Comment: six drinks per year  . Drug use: No  . Sexual activity: Yes    Birth control/protection: IUD  Lifestyle  . Physical activity:    Days per week: Not on file    Minutes per session: Not on file  . Stress: Not on file  Relationships  . Social connections:    Talks on phone: Not on file    Gets together: Not on file    Attends religious service: Not on file    Active member of club or organization: Not on file    Attends meetings of clubs or organizations: Not on file    Relationship status: Not on file  . Intimate partner violence:    Fear of current or ex partner: Not on file    Emotionally abused: Not on file    Physically abused: Not on file    Forced sexual activity: Not on file  Other Topics Concern  . Not on file  Social History Narrative  .  Not on file   Family History  Problem Relation Age of Onset  . Hypertension Father     Objective: Office vital signs reviewed. BP 121/79   Pulse 71   Temp 98.2 F (36.8 C) (Oral)   Ht 5' 4"  (1.626 m)   Wt 181 lb 9.6 oz (82.4 kg)   BMI 31.17 kg/m   Physical Examination:  General: Awake, alert, well nourished, No acute distress HEENT: Normal    Neck: No masses palpated. No lymphadenopathy    Ears: Tympanic membranes intact, normal light reflex, no erythema, no bulging    Eyes: PERRLA, extraocular membranes intact, sclera clear    Nose: nasal turbinates moist,  nasal discharge    Throat: moist mucus membranes, no erythema, no tonsillar exudate.  Airway is patent Cardio: regular rate and rhythm, S1S2 heard, no murmurs appreciated Pulm: clear to auscultation bilaterally, no wheezes, rhonchi or rales; normal work of breathing on  room air Extremities: warm, well perfused, No edema, cyanosis or clubbing;  MSK: normal gait and normal station Skin: dry; intact; no rashes or lesions Neuro: normal Strength and light touch sensation grossly intact  Assessment/ Plan: 52 y.o. female   1. Weight gain - CBC with Differential/Platelet - CMP14+EGFR - Lipid panel - Thyroid Panel With TSH - Vitamin B12 - Iron  2. Hair loss - CBC with Differential/Platelet - Thyroid Panel With TSH  3. Anemia, unspecified type - CBC with Differential/Platelet - Thyroid Panel With TSH - Vitamin B12 - Iron  4. H/O bilateral breast implants Going to plastic surgeon  5. Displacement of breast implant, subsequent encounter Going to plastic surgeon  Follow-up as needed or worsening of symptoms. Call office for any issues.   Terald Sleeper PA-C [facility] [address]  [place], [place] [address] [phone]

## 2017-12-25 ENCOUNTER — Other Ambulatory Visit: Payer: Self-pay | Admitting: Physician Assistant

## 2017-12-25 LAB — LIPID PANEL
CHOLESTEROL TOTAL: 184 mg/dL (ref 100–199)
Chol/HDL Ratio: 2.6 ratio (ref 0.0–4.4)
HDL: 72 mg/dL (ref >39)
LDL Calculated: 99 mg/dL (ref 0–99)
Triglycerides: 64 mg/dL (ref 0–149)
VLDL CHOLESTEROL CAL: 13 mg/dL (ref 5–40)

## 2017-12-25 LAB — CMP14+EGFR
A/G RATIO: 1.8 (ref 1.2–2.2)
ALK PHOS: 109 IU/L (ref 39–117)
ALT: 13 IU/L (ref 0–32)
AST: 17 IU/L (ref 0–40)
Albumin: 4.2 g/dL (ref 3.5–5.5)
BILIRUBIN TOTAL: 0.4 mg/dL (ref 0.0–1.2)
BUN/Creatinine Ratio: 16 (ref 9–23)
BUN: 10 mg/dL (ref 6–24)
CHLORIDE: 105 mmol/L (ref 96–106)
CO2: 25 mmol/L (ref 20–29)
Calcium: 9.3 mg/dL (ref 8.7–10.2)
Creatinine, Ser: 0.61 mg/dL (ref 0.57–1.00)
GFR calc non Af Amer: 104 mL/min/{1.73_m2} (ref >59)
GFR, EST AFRICAN AMERICAN: 120 mL/min/{1.73_m2} (ref >59)
Globulin, Total: 2.4 g/dL (ref 1.5–4.5)
Glucose: 92 mg/dL (ref 65–99)
POTASSIUM: 4.2 mmol/L (ref 3.5–5.2)
Sodium: 145 mmol/L — ABNORMAL HIGH (ref 134–144)
TOTAL PROTEIN: 6.6 g/dL (ref 6.0–8.5)

## 2017-12-25 LAB — CBC WITH DIFFERENTIAL/PLATELET
BASOS: 1 %
Basophils Absolute: 0.1 10*3/uL (ref 0.0–0.2)
EOS (ABSOLUTE): 0.2 10*3/uL (ref 0.0–0.4)
Eos: 5 %
HEMATOCRIT: 38.5 % (ref 34.0–46.6)
Hemoglobin: 12.6 g/dL (ref 11.1–15.9)
Immature Grans (Abs): 0 10*3/uL (ref 0.0–0.1)
Immature Granulocytes: 0 %
LYMPHS ABS: 1.2 10*3/uL (ref 0.7–3.1)
Lymphs: 26 %
MCH: 28.1 pg (ref 26.6–33.0)
MCHC: 32.7 g/dL (ref 31.5–35.7)
MCV: 86 fL (ref 79–97)
MONOS ABS: 0.5 10*3/uL (ref 0.1–0.9)
Monocytes: 11 %
NEUTROS PCT: 57 %
Neutrophils Absolute: 2.6 10*3/uL (ref 1.4–7.0)
PLATELETS: 299 10*3/uL (ref 150–450)
RBC: 4.49 x10E6/uL (ref 3.77–5.28)
RDW: 13.4 % (ref 12.3–15.4)
WBC: 4.6 10*3/uL (ref 3.4–10.8)

## 2017-12-25 LAB — IRON: Iron: 75 ug/dL (ref 27–159)

## 2017-12-25 LAB — THYROID PANEL WITH TSH
FREE THYROXINE INDEX: 1.6 (ref 1.2–4.9)
T3 UPTAKE RATIO: 25 % (ref 24–39)
T4, Total: 6.2 ug/dL (ref 4.5–12.0)
TSH: 2.91 u[IU]/mL (ref 0.450–4.500)

## 2017-12-25 LAB — VITAMIN B12: VITAMIN B 12: 1222 pg/mL (ref 232–1245)

## 2017-12-25 MED ORDER — LEVOTHYROXINE SODIUM 25 MCG PO TABS: 25.0000 ug | ORAL_TABLET | Freq: Every day | ORAL | 1 refills | Status: DC

## 2018-01-08 ENCOUNTER — Encounter: Payer: Self-pay | Admitting: Physician Assistant

## 2018-01-08 ENCOUNTER — Other Ambulatory Visit: Payer: Self-pay | Admitting: Physician Assistant

## 2018-01-22 ENCOUNTER — Encounter: Payer: Self-pay | Admitting: Physician Assistant

## 2018-01-22 ENCOUNTER — Ambulatory Visit: Payer: BLUE CROSS/BLUE SHIELD | Admitting: Physician Assistant

## 2018-01-22 VITALS — BP 114/71 | HR 71 | Temp 98.4°F | Ht 64.0 in | Wt 180.2 lb

## 2018-01-22 DIAGNOSIS — E079 Disorder of thyroid, unspecified: Secondary | ICD-10-CM | POA: Diagnosis not present

## 2018-01-22 DIAGNOSIS — R609 Edema, unspecified: Secondary | ICD-10-CM

## 2018-01-22 DIAGNOSIS — R635 Abnormal weight gain: Secondary | ICD-10-CM | POA: Diagnosis not present

## 2018-01-22 DIAGNOSIS — G5792 Unspecified mononeuropathy of left lower limb: Secondary | ICD-10-CM | POA: Diagnosis not present

## 2018-01-22 DIAGNOSIS — R600 Localized edema: Secondary | ICD-10-CM

## 2018-01-22 MED ORDER — TOPIRAMATE 25 MG PO TABS: 25.0000 mg | ORAL_TABLET | Freq: Every day | ORAL | 5 refills | Status: DC

## 2018-01-23 ENCOUNTER — Encounter: Payer: Self-pay | Admitting: Physician Assistant

## 2018-01-23 LAB — THYROID PANEL WITH TSH
Free Thyroxine Index: 2.1 (ref 1.2–4.9)
T3 UPTAKE RATIO: 26 % (ref 24–39)
T4 TOTAL: 8.1 ug/dL (ref 4.5–12.0)
TSH: 1.78 u[IU]/mL (ref 0.450–4.500)

## 2018-01-27 ENCOUNTER — Encounter: Payer: Self-pay | Admitting: Physician Assistant

## 2018-01-27 ENCOUNTER — Other Ambulatory Visit: Payer: Self-pay | Admitting: Physician Assistant

## 2018-01-27 MED ORDER — AMOXICILLIN 500 MG PO CAPS: 500.0000 mg | ORAL_CAPSULE | Freq: Three times a day (TID) | ORAL | 0 refills | Status: DC

## 2018-01-27 NOTE — Progress Notes (Signed)
BP 114/71   Pulse 71   Temp 98.4 F (36.9 C) (Oral)   Ht 5\' 4"  (1.626 m)   Wt 180 lb 3.2 oz (81.7 kg)   BMI 30.93 kg/m    Subjective:    Patient ID: [name], female    DOB: 1965/07/13, 52 y.o.   MRN: [id number]  HPI: KAMIRAH SHUGRUE is a 52 y.o. female presenting on 01/22/2018 for thyroid check  This patient comes in for 1 month recheck on her thyroid condition.  She was always found to be slightly irregular in her thyroid readings.  She has a very strong family history of hypothyroidism.  Therefore we had started her with levothyroxine 25 mcg 1/day.  She states that she has felt very good on it.  We will redo labs today.  She also continues with neuropathy on her left foot it is fairly consistent almost every day.  She has seen podiatry in the past.  She would like to have a neurology referral.  The pain is more so as the day goes on.  She will have some more pain after being up on it and walking a lot.  If she wears better shoes it does feel somewhat better.  Patient is also having right lower leg edema.  Is from the knee down.  It happens intermittently.  But it is fairly regular.  She does not have any shortness of breath or strong pain in her leg when it happens.  There are only great concern is if there is a vascular cause so we will plan for evaluation of this.  Patient is also having more weight gain and we have discussed using Topamax as a medicine to reduce appetite.  She is willing to try it a long discussion about the side effects.  Plan to recheck her in 1 month.  History reviewed. No pertinent past medical history. Relevant past medical, surgical, family and social history reviewed and updated as indicated. Interim medical history since our last visit reviewed. Allergies and medications reviewed and updated. DATA REVIEWED: CHART IN EPIC  Family History reviewed for pertinent findings.  Review of Systems  Constitutional: Negative.   HENT: Negative.   Eyes:  Negative.   Respiratory: Negative.  Negative for shortness of breath and wheezing.   Cardiovascular: Positive for leg swelling. Negative for chest pain and palpitations.  Gastrointestinal: Negative.   Genitourinary: Negative.   Musculoskeletal: Positive for arthralgias, joint swelling and myalgias.  Skin: Negative.  Negative for color change, pallor, rash and wound.  Neurological: Negative for dizziness, weakness and numbness.    Allergies as of 01/22/2018      Reactions   Hyoscyamine Anaphylaxis   Caffeine    Rapid heart rate.   Doxycycline    Racing heart.   Nexium [esomeprazole]    Swollen throat      Medication List        Accurate as of 01/22/18 11:59 PM. Always use your most recent med list.          levothyroxine 25 MCG tablet Commonly known as:  SYNTHROID, LEVOTHROID Take 1 tablet (25 mcg total) by mouth daily before breakfast.   Minocycline HCl 90 MG Tb24 Take 1 tablet by mouth daily.   topiramate 25 MG tablet Commonly known as:  TOPAMAX Take 1-2 tablets (25-50 mg total) by mouth daily.          Objective:    BP 114/71   Pulse 71   Temp  98.4 F (36.9 C) (Oral)   Ht 5\' 4"  (1.626 m)   Wt 180 lb 3.2 oz (81.7 kg)   BMI 30.93 kg/m   Allergies  Allergen Reactions  . Hyoscyamine Anaphylaxis  . Caffeine     Rapid heart rate.  . Doxycycline     Racing heart.  Marland Kitchen Nexium [Esomeprazole]     Swollen throat    Wt Readings from Last 3 Encounters:  01/22/18 180 lb 3.2 oz (81.7 kg)  12/24/17 181 lb 9.6 oz (82.4 kg)  09/16/17 175 lb (79.4 kg)    Physical Exam  Constitutional: She is oriented to person, place, and time. She appears well-developed and well-nourished.  HENT:  Head: Normocephalic and atraumatic.  Right Ear: Tympanic membrane, external ear and ear canal normal.  Left Ear: Tympanic membrane, external ear and ear canal normal.  Nose: Nose normal. No rhinorrhea.  Mouth/Throat: Oropharynx is clear and moist and mucous membranes are normal. No  oropharyngeal exudate or posterior oropharyngeal erythema.  Eyes: Pupils are equal, round, and reactive to light. Conjunctivae and EOM are normal.  Neck: Normal range of motion. Neck supple.  Cardiovascular: Normal rate, regular rhythm, normal heart sounds and intact distal pulses.  Pulmonary/Chest: Effort normal and breath sounds normal.  Abdominal: Soft. Bowel sounds are normal.  Neurological: She is alert and oriented to person, place, and time. She has normal reflexes.  Skin: Skin is warm and dry. No rash noted.  Psychiatric: She has a normal mood and affect. Her behavior is normal. Judgment and thought content normal.    Results for orders placed or performed in visit on 01/22/18  Thyroid Panel With TSH  Result Value Ref Range   TSH 1.780 0.450 - 4.500 uIU/mL   T4, Total 8.1 4.5 - 12.0 ug/dL   T3 Uptake Ratio 26 24 - 39 %   Free Thyroxine Index 2.1 1.2 - 4.9      Assessment & Plan:   1. Neuropathy of left foot - Ambulatory referral to Neurology  2. Edema, unspecified type - VAS [place] LOWER EXTREMITY VENOUS (DVT); Future  3. Weight gain - topiramate (TOPAMAX) 25 MG tablet; Take 1-2 tablets (25-50 mg total) by mouth daily.  Dispense: 60 tablet; Refill: 5  4. Thyroid condition - Thyroid Panel With TSH  5. Leg edema, right - VAS [place] LOWER EXTREMITY VENOUS (DVT); Future   Continue all other maintenance medications as listed above.  Follow up plan: No follow-ups on file.  Educational handout given for [place] PA-C [facility] [address]  [place], [place] [address] [phone]   01/27/2018, 1:48 PM

## 2018-02-03 ENCOUNTER — Encounter: Payer: Self-pay | Admitting: Neurology

## 2018-02-03 ENCOUNTER — Other Ambulatory Visit: Payer: Self-pay

## 2018-02-03 ENCOUNTER — Ambulatory Visit: Payer: BLUE CROSS/BLUE SHIELD | Admitting: Neurology

## 2018-02-03 VITALS — BP 124/80 | HR 76 | Ht 64.0 in | Wt 180.5 lb

## 2018-02-03 DIAGNOSIS — R200 Anesthesia of skin: Secondary | ICD-10-CM | POA: Diagnosis not present

## 2018-02-03 DIAGNOSIS — R202 Paresthesia of skin: Secondary | ICD-10-CM | POA: Diagnosis not present

## 2018-02-03 DIAGNOSIS — M5410 Radiculopathy, site unspecified: Secondary | ICD-10-CM

## 2018-02-03 DIAGNOSIS — M79672 Pain in left foot: Secondary | ICD-10-CM | POA: Diagnosis not present

## 2018-02-03 MED ORDER — IMIPRAMINE HCL 25 MG PO TABS: 25.0000 mg | ORAL_TABLET | Freq: Every day | ORAL | 5 refills | Status: DC

## 2018-02-03 NOTE — Progress Notes (Signed)
[facility]  PATIENT: Stephanie Brewer DOB: 10/14/1965  REFERRING DOCTOR OR PCP:  Particia Nearing, PA-C SOURCE: patient, notes from the ED,  lab reports,   _________________________________   HISTORICAL  CHIEF COMPLAINT:  Chief Complaint  Patient presents with  . New Patient (Initial Visit)    RM 13, alone. Internal referral from Terald Sleeper, PA-C  at [place] for neuropathy of left foot  . Peripheral Neuropathy    c/o left foot neuropathy.     HISTORY OF PRESENT ILLNESS:  I had the pleasure seeing patient, Stephanie Brewer, at [facility] for neurologic consultation regarding the numbness and pain in the left foot.  She is a 52 y.o. woman with numbness in her left foot that began earlier this year.   The numbness is in the 3rd, 4th and 5th toe, dorsum and sole and only the closest adjacent foot.  The sole is worse than the dorsum.   When she goes barefoot, she notes pain as well and the 2nd toe (lateral sie) is also involved.  No involvement of proximal foot or ankle.   The numbness is more of a nuisance but the pain is severe at times.    She has seen podiatry and had injections for Morton's neuroma.   She had no benefit.    She was told she may need surgery but would like to consider other options.     She has some back discomfort and occasionally will have sciatica down either leg.   No bladder issues.  She does not associate the onset of symptoms with any activity.       She notes mild weakness in her foot when she has more numbness or pain.     No trauma to her foot or ankle or back recently but she did have an Achilles tendon tear requiring surgery as a teenager.      She has not tried any medications recently but tried gabapentin initially.   It was poorly tolerated.       Vitamin B12, iron, TSH were normal with labs checked 12/24/2017  REVIEW OF SYSTEMS: Constitutional: No fevers, chills, sweats, or change in  appetite Eyes: No visual changes, double vision, eye pain Ear, nose and throat: No hearing loss, ear pain, nasal congestion, sore throat Cardiovascular: No chest pain, palpitations Respiratory: No shortness of breath at rest or with exertion.   No wheezes GastrointestinaI: No nausea, vomiting, diarrhea, abdominal pain, fecal incontinence Genitourinary: No dysuria, urinary retention or frequency.  No nocturia. Musculoskeletal: No neck pain, back pain Integumentary: No rash, pruritus, skin lesions Neurological: as above Psychiatric: No depression at this time.  No anxiety Endocrine: No palpitations, diaphoresis, change in appetite, change in weigh or increased thirst Hematologic/Lymphatic: No anemia, purpura, petechiae. Allergic/Immunologic: No itchy/runny eyes, nasal congestion, recent allergic reactions, rashes  ALLERGIES: Allergies  Allergen Reactions  . Hyoscyamine Anaphylaxis  . Caffeine     Rapid heart rate.  . Doxycycline     Racing heart.  Marland Kitchen Nexium [Esomeprazole]     Swollen throat    HOME MEDICATIONS:  Current Outpatient Medications:  .  levothyroxine (SYNTHROID, LEVOTHROID) 25 MCG tablet, Take 1 tablet (25 mcg total) by mouth daily before breakfast., Disp: 30 tablet, Rfl: 1 .  meclizine (ANTIVERT) 25 MG tablet, Take 25 mg by mouth as needed for dizziness., Disp: , Rfl:  .  Minocycline HCl 90 MG TB24, Take 1 tablet by mouth daily. Only takes prn for  break outs, Disp: , Rfl:  .  topiramate (TOPAMAX) 25 MG tablet, Take 1-2 tablets (25-50 mg total) by mouth daily. (Patient not taking: Reported on 02/03/2018), Disp: 60 tablet, Rfl: 5  PAST MEDICAL HISTORY: History reviewed. No pertinent past medical history.  PAST SURGICAL HISTORY: Past Surgical History:  Procedure Laterality Date  . BREAST SURGERY     Augmentation 2003  . HERNIA REPAIR  2002    FAMILY HISTORY: Family History  Problem Relation Age of Onset  . Hypertension Father     SOCIAL HISTORY:  Social  History   Socioeconomic History  . Marital status: Married    Spouse name: Not on file  . Number of children: 2  . Years of education: some college  . Highest education level: Not on file  Occupational History  . Occupation: [employer]  . Financial resource strain: Not on file  . Food insecurity:    Worry: Not on file    Inability: Not on file  . Transportation needs:    Medical: Not on file    Non-medical: Not on file  Tobacco Use  . Smoking status: Never Smoker  . Smokeless tobacco: Never Used  Substance and Sexual Activity  . Alcohol use: Yes    Comment: rare-twice monthly or less  . Drug use: No  . Sexual activity: Yes    Birth control/protection: IUD  Lifestyle  . Physical activity:    Days per week: Not on file    Minutes per session: Not on file  . Stress: Not on file  Relationships  . Social connections:    Talks on phone: Not on file    Gets together: Not on file    Attends religious service: Not on file    Active member of club or organization: Not on file    Attends meetings of clubs or organizations: Not on file    Relationship status: Not on file  . Intimate partner violence:    Fear of current or ex partner: Not on file    Emotionally abused: Not on file    Physically abused: Not on file    Forced sexual activity: Not on file  Other Topics Concern  . Not on file  Social History Narrative   Lives with spouse   Caffeine use: no caffeine    Left handed      PHYSICAL EXAM  Vitals:   02/03/18 1024  BP: 124/80  Pulse: 76  Weight: 180 lb 8 oz (81.9 kg)  Height: 5\' 4"  (1.626 m)    Body mass index is 30.98 kg/m.   General: The patient is well-developed and well-nourished and in no acute distress   Skin: Extremities are without significant edema.  Extremities and Musculoskeletal:  Back is nontender.   Tender to deep palpation over distal foot 2/3 and 3/4.       Neurologic Exam  Mental status: The patient is alert and  oriented x 3 at the time of the examination. The patient has apparent normal recent and remote memory, with an apparently normal attention span and concentration ability.   Speech is normal.  Cranial nerves: Extraocular movements are full.   There is good facial sensation to soft touch bilaterally.Facial strength is normal.  Trapezius and sternocleidomastoid strength is normal. No dysarthria is noted.  The tongue is midline, and the patient has symmetric elevation of the soft palate. No obvious hearing deficits are noted.  Motor:  Muscle bulk is normal.  Tone is normal. Strength is  5 / 5 in the arms and right leg and foot.  However, toe extension is 4+/5 on the left.  Although ankle plantar flexion appeared normal when tested against my arms, she has decreased ability to stand on her left toes and on her right toes..   Sensory:   She has reduced sensation to touch over the lateral aspect of the second toe and the third fourth and fifth toe on the left.  There is some asymmetry also noted in the S1 distribution (lateral dorsum, lateral foot and sole) of the left foot.  Coordination: Cerebellar testing reveals good finger-nose-finger and heel-to-shin bilaterally.  Gait and station: Station is normal.   Gait is normal. Tandem gait is mildly wide. Romberg is negative.   Reflexes: Deep tendon reflexes are symmetric and normal in the arms and knees but she has reduced ankle reflex on the left relative to the right..   Plantar responses are flexor.    DIAGNOSTIC DATA (LABS, IMAGING, TESTING) - I reviewed patient records, labs, notes, testing and imaging myself where available.  Lab Results  Component Value Date   WBC 4.6 12/24/2017   HGB 12.6 12/24/2017   HCT 38.5 12/24/2017   MCV 86 12/24/2017   PLT 299 12/24/2017      Component Value Date/Time   NA 145 (H) 12/24/2017 0843   K 4.2 12/24/2017 0843   CL 105 12/24/2017 0843   CO2 25 12/24/2017 0843   GLUCOSE 92 12/24/2017 0843   BUN 10  12/24/2017 0843   CREATININE 0.61 12/24/2017 0843   CALCIUM 9.3 12/24/2017 0843   PROT 6.6 12/24/2017 0843   ALBUMIN 4.2 12/24/2017 0843   AST 17 12/24/2017 0843   ALT 13 12/24/2017 0843   ALKPHOS 109 12/24/2017 0843   BILITOT 0.4 12/24/2017 0843   GFRNONAA 104 12/24/2017 0843   GFRAA 120 12/24/2017 0843   Lab Results  Component Value Date   CHOL 184 12/24/2017   HDL 72 12/24/2017   LDLCALC 99 12/24/2017   TRIG 64 12/24/2017   CHOLHDL 2.6 12/24/2017   No results found for: HGBA1C Lab Results  Component Value Date   VITAMINB12 1,222 12/24/2017   Lab Results  Component Value Date   TSH 1.780 01/22/2018       ASSESSMENT AND PLAN  Radicular leg pain - Plan: MR LUMBAR SPINE WO CONTRAST, NCV with EMG(electromyography)  Numbness and tingling - Plan: NCV with EMG(electromyography)  Foot pain, left - Plan: NCV with EMG(electromyography)   In summary, Mrs. Harral is a 52 year old woman with left foot numbness who also had mild weakness noted during examination.  Symptoms do not completely map out but she does have numbness over the S1 distribution on the left as well as decreased ability to stand on her toes on the left.  These could be due to a left S1 radiculopathy.  Decreased toe extension could imply some involvement of L5.  Additionally, she has tenderness between the second and third and third and fourth toes which could be due to a Morton's neuroma.  To help define the relative contribution of these issues, we will have her undergo NCV/EMG testing and we will obtain an MRI of the lumbar spine.  Based on results, we may need to consider an intervention such as epidural steroid or referral to surgery if there are severe lumbar issues.  She would like to try some more medical options before considering surgery on her foot.  She could not  tolerate gabapentin.  She will start imipramine 25 mg nightly.      I will see her when she returns for the NCV/EMG and further intervention  and follow-up will be considered at that time.  Thank you for asking me to see Mrs. Minette Brine.  Please let me know if I can be of further assistance with her or other patients in the future.  Trejuan Matherne A. Felecia Shelling, MD, [facility] 37/04/624, 94:85 AM Certified in Neurology, Clinical Neurophysiology, Sleep Medicine, Pain Medicine and Neuroimaging  [facility] [address], [place], [place] [address] [phone]

## 2018-02-04 ENCOUNTER — Encounter: Payer: Self-pay | Admitting: Neurology

## 2018-02-04 ENCOUNTER — Encounter: Payer: BLUE CROSS/BLUE SHIELD | Admitting: Neurology

## 2018-02-04 ENCOUNTER — Ambulatory Visit (INDEPENDENT_AMBULATORY_CARE_PROVIDER_SITE_OTHER): Payer: BLUE CROSS/BLUE SHIELD | Admitting: Neurology

## 2018-02-04 ENCOUNTER — Telehealth: Payer: Self-pay | Admitting: Neurology

## 2018-02-04 DIAGNOSIS — Z0289 Encounter for other administrative examinations: Secondary | ICD-10-CM

## 2018-02-04 DIAGNOSIS — M79672 Pain in left foot: Secondary | ICD-10-CM | POA: Insufficient documentation

## 2018-02-04 DIAGNOSIS — R202 Paresthesia of skin: Secondary | ICD-10-CM | POA: Diagnosis not present

## 2018-02-04 DIAGNOSIS — R200 Anesthesia of skin: Secondary | ICD-10-CM | POA: Diagnosis not present

## 2018-02-04 DIAGNOSIS — M5410 Radiculopathy, site unspecified: Secondary | ICD-10-CM

## 2018-02-04 NOTE — Telephone Encounter (Signed)
MR Lumbar spine wo contrast Dr. Cheree Ditto Auth: [id number] (exp. 02/03/18 to 03/04/18). Patient is scheduled at [facility] for 02/12/18.

## 2018-02-04 NOTE — Progress Notes (Signed)
Full Name: Stephanie Brewer Gender: Female MRN #: [id number] Date of Birth: [birth year]    Visit Date: 02/04/2018 13:38 Age: 52 Years 61 Months Old Examining Physician: Arlice Colt, MD  Referring Physician: Felecia Shelling, MD     History: Stephanie Brewer is a 48 year old woman with left foot pain and numbness and mild ankle weakness.  Nerve conduction studies: The left tibial and peroneal motor responses had normal distal latencies, amplitudes and conduction velocities.  The left sural, superficial peroneal, medial plantar and lateral plantar sensory responses had normal peak latencies and amplitudes.  The tibial F-wave response was normal.  Electromyography: Needle EMG of the vastus medialis, tibialis anterior, gastrocnemius, gluteus medius and iliopsoas muscles were normal.  There were a few polyphasic motor units but normal recruitment in the peroneus longus and there was mild chronic denervation in the abductor hallucis and abductor digiti minimi muscles.  There was no acute denervation.  Impression: This NCV/EMG study shows the following: 1.    There is no evidence of a polyneuropathy or mononeuropathy in the left leg. 2.     There was mild chronic denervation in the AH and ADM foot muscles.  This is nonspecific and could be incidental or seen with an S1 chronic radiculopathy.   Richard A. Felecia Shelling, MD, PhD, FAAN Certified in Neurology, Clinical Neurophysiology, Sleep Medicine, Pain Medicine and Neuroimaging Director, [place] [facility] [address] [place], [place] [address] [phone]         [facility]    Nerve / Sites Muscle Latency Ref. Amplitude Ref. Rel Amp Segments Distance Velocity Ref. Area    ms ms mV mV %  cm m/s m/s mVms  L Peroneal - EDB     Ankle EDB 3.8 ?6.5 2.5 ?2.0 100 Ankle - EDB 9   8.8     Fib head EDB 9.4  2.3  92 Fib head - Ankle 30 53 ?44 9.2     Pop fossa EDB 11.5  2.0  87.9  Pop fossa - Fib head 10 47 ?44 8.0         Pop fossa - Ankle      L Tibial - AH     Ankle AH 3.4 ?5.8 11.7 ?4.0 100 Ankle - AH 9   21.4     Pop fossa AH 11.6  9.0  76.3 Pop fossa - Ankle 37 45 ?41 20.7         SNC    Nerve / Sites Rec. Site Peak Lat Ref.  Amp Ref. Segments Distance Peak Diff Ref.    ms ms V V  cm ms ms  L Sural - Ankle (Calf)     Calf Ankle 3.6 ?4.4 6 ?6 Calf - Ankle 14    L Superficial peroneal - Ankle     Lat leg Ankle 3.8 ?4.4 9 ?6 Lat leg - Ankle 14    L Medial plantar, Lateral plantar - Ankle (Medial, lateral sole)     Medial plantar Sole Ankle 3.1 ?5.9 4 ?3 Medial plantar Sole - Ankle 14       Lateral plantar Sole Ankle 3.0 ?5.9 3 ?3 Lateral plantar Sole - Ankle 14          Medial plantar Sole - Lateral plantar Sole  0.1 ?0.0                 F  Wave    Nerve  F Lat Ref.   ms ms  L Tibial - AH 46.5 ?56.0       EMG full       EMG Summary Table    Spontaneous MUAP Recruitment  Muscle IA Fib PSW Fasc Other Amp Dur. Poly Pattern  L. Vastus medialis Normal None None None _______ Normal Normal Normal Normal  L. Tibialis anterior Normal None None None _______ Normal Normal Normal Normal  L. Peroneus longus Normal None None None _______ Normal Normal 1+ Normal  L. Gastrocnemius (Medial head) Normal None None None _______ Normal Normal Normal Normal  L. Abductor hallucis Normal None None None _______ Normal Normal 1+ Reduced  L. Abductor digiti minimi (pedis) Normal None None None _______ Normal Increased 1+ Reduced  L. Gluteus medius Normal None None None _______ Normal Normal Normal Normal  L. Iliopsoas Normal None None None _______ Normal Normal Normal Normal

## 2018-02-12 ENCOUNTER — Ambulatory Visit: Payer: BLUE CROSS/BLUE SHIELD

## 2018-02-12 DIAGNOSIS — M5410 Radiculopathy, site unspecified: Secondary | ICD-10-CM

## 2018-02-18 ENCOUNTER — Telehealth: Payer: Self-pay | Admitting: *Deleted

## 2018-02-18 NOTE — Telephone Encounter (Signed)
-----   Message from Britt Bottom, MD sent at 02/17/2018  7:28 PM EST ----- Let her know that the MRI of the lumbar spine showed mild disc degenerative changes, typical for age. There was no nerve root compression or spinal stenosis.

## 2018-02-18 NOTE — Telephone Encounter (Signed)
Called, LVM for pt relaying results per Dr. Felecia Shelling note. Gave [facility] phone number if she has further questions/concerns.

## 2018-02-26 ENCOUNTER — Ambulatory Visit: Payer: BLUE CROSS/BLUE SHIELD | Admitting: Physician Assistant

## 2018-03-04 ENCOUNTER — Ambulatory Visit: Payer: BLUE CROSS/BLUE SHIELD | Admitting: Family

## 2018-03-04 ENCOUNTER — Encounter: Payer: Self-pay | Admitting: Family

## 2018-03-04 VITALS — BP 124/73 | HR 77 | Temp 99.0°F | Ht 64.0 in | Wt 172.0 lb

## 2018-03-04 DIAGNOSIS — R6889 Other general symptoms and signs: Secondary | ICD-10-CM

## 2018-03-04 DIAGNOSIS — J40 Bronchitis, not specified as acute or chronic: Secondary | ICD-10-CM

## 2018-03-04 LAB — VERITOR FLU A/B WAIVED
INFLUENZA A: NEGATIVE
INFLUENZA B: NEGATIVE

## 2018-03-04 MED ORDER — GUAIFENESIN-CODEINE 100-10 MG/5ML PO SOLN: 5.0000 mL | Freq: Four times a day (QID) | ORAL | 0 refills | Status: DC | PRN

## 2018-03-04 NOTE — Patient Instructions (Signed)

## 2018-03-04 NOTE — Progress Notes (Signed)
   Subjective:    Patient ID: Stephanie Brewer, female    DOB: [birth year], 52 y.o.   MRN: [id number]  Chief Complaint  Patient presents with  . Cough    congested with night seats and chills  . Fever    chills    Cough  This is a new problem. The current episode started in the past 7 days. The problem has been gradually worsening. The problem occurs every few minutes. Associated symptoms include chills, a fever and myalgias.      Review of Systems  Constitutional: Positive for chills and fever.  Respiratory: Positive for cough.   Musculoskeletal: Positive for myalgias.  All other systems reviewed and are negative.      Objective:   Physical Exam  Constitutional: She is oriented to person, place, and time. She appears well-developed and well-nourished. No distress.  HENT:  Head: Normocephalic and atraumatic.  Right Ear: External ear normal.  Left Ear: External ear normal.  Mouth/Throat: Oropharynx is clear and moist.  Eyes: Pupils are equal, round, and reactive to light.  Neck: Normal range of motion. Neck supple. No thyromegaly present.  Cardiovascular: Normal rate, regular rhythm, normal heart sounds and intact distal pulses.  No murmur heard. Pulmonary/Chest: Effort normal and breath sounds normal. No respiratory distress. She has no wheezes.  Intermittent dry cough  Abdominal: Soft. Bowel sounds are normal. She exhibits no distension. There is no tenderness.  Musculoskeletal: Normal range of motion. She exhibits no edema or tenderness.  Neurological: She is alert and oriented to person, place, and time. She has normal reflexes. No cranial nerve deficit.  Skin: Skin is warm and dry.  Psychiatric: She has a normal mood and affect. Her behavior is normal. Judgment and thought content normal.  Vitals reviewed.   BP (!) 134/92   Pulse 90   Temp 99 F (37.2 C) (Oral)   Ht 5\' 4"  (1.626 m)   Wt 172 lb (78 kg)   LMP 05/03/2017   BMI 29.52 kg/m      Assessment & Plan:   Stephanie Brewer comes in today with chief complaint of Cough (congested with night seats and chills) and Fever (chills)   Diagnosis and orders addressed:  1. Flu-like symptoms Flu negative - Veritor Flu A/B Waived  2. Bronchitis - Take meds as prescribed - Use a cool mist humidifier  -Use saline nose sprays frequently -Force fluids -For any cough or congestion  Use plain Mucinex- regular strength or max strength is fine -For fever or aces or pains- take tylenol or ibuprofen. -Throat lozenges if help -RTO if symptoms worsen or do not improve - guaiFENesin-codeine 100-10 MG/5ML syrup; Take 5 mLs by mouth every 6 (six) hours as needed for cough.  Dispense: 120 mL; Refill: 0   Stephanie Dun, FNP

## 2018-03-11 ENCOUNTER — Other Ambulatory Visit: Payer: Self-pay | Admitting: Physician Assistant

## 2018-03-11 DIAGNOSIS — J40 Bronchitis, not specified as acute or chronic: Secondary | ICD-10-CM

## 2018-03-11 MED ORDER — GUAIFENESIN-CODEINE 100-10 MG/5ML PO SOLN: 5.0000 mL | Freq: Four times a day (QID) | ORAL | 0 refills | Status: DC | PRN

## 2018-03-21 ENCOUNTER — Encounter: Payer: Self-pay | Admitting: Physician Assistant

## 2018-03-21 ENCOUNTER — Other Ambulatory Visit: Payer: Self-pay | Admitting: Physician Assistant

## 2018-03-21 MED ORDER — AMOXICILLIN 500 MG PO CAPS: 1000.0000 mg | ORAL_CAPSULE | Freq: Two times a day (BID) | ORAL | 0 refills | Status: DC

## 2018-04-08 DIAGNOSIS — M79672 Pain in left foot: Secondary | ICD-10-CM | POA: Diagnosis not present

## 2018-04-08 DIAGNOSIS — M7742 Metatarsalgia, left foot: Secondary | ICD-10-CM | POA: Diagnosis not present

## 2018-04-30 ENCOUNTER — Encounter: Payer: Self-pay | Admitting: Family Medicine

## 2018-05-14 DIAGNOSIS — S0502XA Injury of conjunctiva and corneal abrasion without foreign body, left eye, initial encounter: Secondary | ICD-10-CM | POA: Diagnosis not present

## 2018-05-27 ENCOUNTER — Encounter: Payer: Self-pay | Admitting: Physician Assistant

## 2018-05-30 ENCOUNTER — Other Ambulatory Visit: Payer: Self-pay | Admitting: Physician Assistant

## 2018-05-30 ENCOUNTER — Encounter: Payer: Self-pay | Admitting: Physician Assistant

## 2018-05-30 MED ORDER — AMOXICILLIN 500 MG PO CAPS: 1000.0000 mg | ORAL_CAPSULE | Freq: Two times a day (BID) | ORAL | 0 refills | Status: DC

## 2018-06-06 DIAGNOSIS — Z1231 Encounter for screening mammogram for malignant neoplasm of breast: Secondary | ICD-10-CM | POA: Diagnosis not present

## 2018-06-18 ENCOUNTER — Encounter: Payer: Self-pay | Admitting: Physician Assistant

## 2018-09-17 DIAGNOSIS — H5213 Myopia, bilateral: Secondary | ICD-10-CM | POA: Diagnosis not present

## 2018-11-11 ENCOUNTER — Encounter: Payer: Self-pay | Admitting: Physician Assistant

## 2018-11-11 ENCOUNTER — Ambulatory Visit (INDEPENDENT_AMBULATORY_CARE_PROVIDER_SITE_OTHER): Payer: BC Managed Care – PPO | Admitting: Physician Assistant

## 2018-11-11 DIAGNOSIS — F419 Anxiety disorder, unspecified: Secondary | ICD-10-CM | POA: Diagnosis not present

## 2018-11-11 MED ORDER — DIAZEPAM 5 MG PO TABS: 5.0000 mg | ORAL_TABLET | Freq: Two times a day (BID) | ORAL | 0 refills | Status: DC | PRN

## 2018-11-11 NOTE — Progress Notes (Signed)
     Telephone visit  Subjective: JO:[id number] wall pain PCP: Terald Sleeper, PA-C DIY:MEBR Stephanie Brewer is a 53 y.o. female calls for telephone consult today. Patient provides verbal consent for consult held via phone.  Patient is identified with 2 separate identifiers.  At this time the entire area is on COVID-19 social distancing and stay home orders are in place.  Patient is of higher risk and therefore we are performing this by a virtual method.  Location of patient: home Location of provider: [facility] Others present for call: no  She has had about a month of anxiety, poor sleep, exhausted, activity, dizzy, hot, known capsular contracture of the breast implants being out of place.  She has a lot of stress concerning life.  She has busy work and working on housing.  She has not been feeling sad.  She has not been taking anything for sleep. Has taken melatonin on the past.   She will have spells of getting hot and dizzy.     ROS: Per HPI  Allergies  Allergen Reactions  . Hyoscyamine Anaphylaxis  . Caffeine     Rapid heart rate.  . Doxycycline     Racing heart.  Marland Kitchen Nexium [Esomeprazole]     Swollen throat   No past medical history on file.  Current Outpatient Medications:  .  levothyroxine (SYNTHROID, LEVOTHROID) 25 MCG tablet, Take 1 tablet (25 mcg total) by mouth daily before breakfast., Disp: 30 tablet, Rfl: 1 .  meclizine (ANTIVERT) 25 MG tablet, Take 25 mg by mouth as needed for dizziness., Disp: , Rfl:  .  Minocycline HCl 90 MG TB24, Take 1 tablet by mouth daily. Only takes prn for break outs, Disp: , Rfl:  .  topiramate (TOPAMAX) 25 MG tablet, Take 1-2 tablets (25-50 mg total) by mouth daily., Disp: 60 tablet, Rfl: 5 .  diazepam (VALIUM) 5 MG tablet, Take 1 tablet (5 mg total) by mouth every 12 (twelve) hours as needed for anxiety., Disp: 30 tablet, Rfl: 0  Assessment/ Plan: 53 y.o. female   1. Anxiety - diazepam (VALIUM) 5 MG tablet; Take 1 tablet (5 mg total) by mouth  every 12 (twelve) hours as needed for anxiety.  Dispense: 30 tablet; Refill: 0   No follow-ups on file.  Continue all other maintenance medications as listed above.  Start time: 8:00 AM End time: 8:15 AM  Meds ordered this encounter  Medications  . diazepam (VALIUM) 5 MG tablet    Sig: Take 1 tablet (5 mg total) by mouth every 12 (twelve) hours as needed for anxiety.    Dispense:  30 tablet    Refill:  0    Order Specific Question:   Supervising Provider    Answer:   [name] [[id number]]    Particia Nearing PA-C [place] [phone]

## 2019-01-12 ENCOUNTER — Encounter: Payer: Self-pay | Admitting: Physician Assistant

## 2019-01-13 ENCOUNTER — Ambulatory Visit (INDEPENDENT_AMBULATORY_CARE_PROVIDER_SITE_OTHER): Payer: BC Managed Care – PPO | Admitting: Physician Assistant

## 2019-01-13 ENCOUNTER — Encounter: Payer: Self-pay | Admitting: Physician Assistant

## 2019-01-13 ENCOUNTER — Other Ambulatory Visit: Payer: Self-pay

## 2019-01-13 DIAGNOSIS — J020 Streptococcal pharyngitis: Secondary | ICD-10-CM

## 2019-01-13 MED ORDER — AMOXICILLIN 500 MG PO CAPS: 500.0000 mg | ORAL_CAPSULE | Freq: Three times a day (TID) | ORAL | 1 refills | Status: DC

## 2019-01-13 NOTE — Progress Notes (Signed)
      Telephone visit  Subjective: [id number] throat PCP: Terald Sleeper, PA-C [id number] Stephanie Brewer is a 53 y.o. female calls for telephone consult today. Patient provides verbal consent for consult held via phone.  Patient is identified with 2 separate identifiers.  At this time the entire area is on COVID-19 social distancing and stay home orders are in place.  Patient is of higher risk and therefore we are performing this by a virtual method.  Location of patient: home Location of provider: [facility] Others present for call: no   The patient reports that she has had a sore throat for several days  She does have a strep history 3 to get days ago she started with aching and some Chills, but no fever was ever measured. On that day she also experienced nausea and vomiting but that has resolved at this time. At this time she does continue with significant sore throat, headache and just general fatigue.  She denies any exposure to COVID-19 in recent weeks.  ROS: Per HPI  Allergies  Allergen Reactions  . Hyoscyamine Anaphylaxis  . Caffeine     Rapid heart rate.  . Doxycycline     Racing heart.  Marland Kitchen Nexium [Esomeprazole]     Swollen throat   No past medical history on file.  Current Outpatient Medications:  .  amoxicillin (AMOXIL) 500 MG capsule, Take 1 capsule (500 mg total) by mouth 3 (three) times daily., Disp: 30 capsule, Rfl: 1 .  diazepam (VALIUM) 5 MG tablet, Take 1 tablet (5 mg total) by mouth every 12 (twelve) hours as needed for anxiety., Disp: 30 tablet, Rfl: 0 .  levothyroxine (SYNTHROID, LEVOTHROID) 25 MCG tablet, Take 1 tablet (25 mcg total) by mouth daily before breakfast., Disp: 30 tablet, Rfl: 1 .  meclizine (ANTIVERT) 25 MG tablet, Take 25 mg by mouth as needed for dizziness., Disp: , Rfl:  .  Minocycline HCl 90 MG TB24, Take 1 tablet by mouth daily. Only takes prn for break outs, Disp: , Rfl:  .  topiramate (TOPAMAX) 25 MG tablet, Take 1-2 tablets (25-50 mg total) by  mouth daily., Disp: 60 tablet, Rfl: 5  Assessment/ Plan: 53 y.o. female   1. Strep pharyngitis - amoxicillin (AMOXIL) 500 MG capsule; Take 1 capsule (500 mg total) by mouth 3 (three) times daily.  Dispense: 30 capsule; Refill: 1   No follow-ups on file.  Continue all other maintenance medications as listed above.  Start time: 8:27 AM End time: 8:33 AM  Meds ordered this encounter  Medications  . amoxicillin (AMOXIL) 500 MG capsule    Sig: Take 1 capsule (500 mg total) by mouth 3 (three) times daily.    Dispense:  30 capsule    Refill:  1    Order Specific Question:   Supervising Provider    Answer:   [name] [id number]    Particia Nearing PA-C [place] [phone]

## 2019-01-27 ENCOUNTER — Encounter: Payer: Self-pay | Admitting: Physician Assistant

## 2019-01-29 ENCOUNTER — Other Ambulatory Visit: Payer: Self-pay | Admitting: Physician Assistant

## 2019-01-29 DIAGNOSIS — M6530 Trigger finger, unspecified finger: Secondary | ICD-10-CM

## 2019-02-10 DIAGNOSIS — R2232 Localized swelling, mass and lump, left upper limb: Secondary | ICD-10-CM | POA: Diagnosis not present

## 2019-02-10 DIAGNOSIS — M65332 Trigger finger, left middle finger: Secondary | ICD-10-CM | POA: Diagnosis not present

## 2019-03-03 DIAGNOSIS — L718 Other rosacea: Secondary | ICD-10-CM | POA: Diagnosis not present

## 2019-03-24 DIAGNOSIS — M65332 Trigger finger, left middle finger: Secondary | ICD-10-CM | POA: Diagnosis not present

## 2019-04-28 DIAGNOSIS — M7742 Metatarsalgia, left foot: Secondary | ICD-10-CM | POA: Diagnosis not present

## 2019-04-28 DIAGNOSIS — M216X2 Other acquired deformities of left foot: Secondary | ICD-10-CM | POA: Diagnosis not present

## 2019-05-19 DIAGNOSIS — M216X2 Other acquired deformities of left foot: Secondary | ICD-10-CM | POA: Diagnosis not present

## 2019-05-28 DIAGNOSIS — M779 Enthesopathy, unspecified: Secondary | ICD-10-CM | POA: Diagnosis not present

## 2019-05-28 DIAGNOSIS — M79675 Pain in left toe(s): Secondary | ICD-10-CM | POA: Diagnosis not present

## 2019-05-28 DIAGNOSIS — M216X2 Other acquired deformities of left foot: Secondary | ICD-10-CM | POA: Diagnosis not present

## 2019-05-28 DIAGNOSIS — Q6689 Other  specified congenital deformities of feet: Secondary | ICD-10-CM | POA: Diagnosis not present

## 2019-05-28 DIAGNOSIS — M7742 Metatarsalgia, left foot: Secondary | ICD-10-CM | POA: Diagnosis not present

## 2019-05-28 DIAGNOSIS — M79672 Pain in left foot: Secondary | ICD-10-CM | POA: Diagnosis not present

## 2019-06-11 DIAGNOSIS — M216X2 Other acquired deformities of left foot: Secondary | ICD-10-CM | POA: Diagnosis not present

## 2019-06-11 DIAGNOSIS — M79672 Pain in left foot: Secondary | ICD-10-CM | POA: Diagnosis not present

## 2019-06-25 DIAGNOSIS — M79672 Pain in left foot: Secondary | ICD-10-CM | POA: Diagnosis not present

## 2019-06-25 DIAGNOSIS — M216X2 Other acquired deformities of left foot: Secondary | ICD-10-CM | POA: Diagnosis not present

## 2019-07-14 ENCOUNTER — Other Ambulatory Visit: Payer: Self-pay | Admitting: Podiatry

## 2019-07-14 ENCOUNTER — Other Ambulatory Visit (HOSPITAL_COMMUNITY): Payer: Self-pay | Admitting: Podiatry

## 2019-07-14 DIAGNOSIS — M216X2 Other acquired deformities of left foot: Secondary | ICD-10-CM | POA: Diagnosis not present

## 2019-07-14 DIAGNOSIS — M79672 Pain in left foot: Secondary | ICD-10-CM | POA: Diagnosis not present

## 2019-07-14 DIAGNOSIS — I82402 Acute embolism and thrombosis of unspecified deep veins of left lower extremity: Secondary | ICD-10-CM

## 2019-07-15 ENCOUNTER — Other Ambulatory Visit: Payer: Self-pay | Admitting: Family Medicine

## 2019-07-15 ENCOUNTER — Other Ambulatory Visit: Payer: Self-pay

## 2019-07-15 ENCOUNTER — Ambulatory Visit (HOSPITAL_COMMUNITY)
Admission: RE | Admit: 2019-07-15 | Discharge: 2019-07-15 | Disposition: A | Discharge status: Home / Self Care | Payer: BC Managed Care – PPO | Source: Ambulatory Visit | Attending: Podiatry | Admitting: Podiatry

## 2019-07-15 DIAGNOSIS — I82432 Acute embolism and thrombosis of left popliteal vein: Secondary | ICD-10-CM | POA: Diagnosis not present

## 2019-07-15 DIAGNOSIS — I82402 Acute embolism and thrombosis of unspecified deep veins of left lower extremity: Secondary | ICD-10-CM | POA: Diagnosis not present

## 2019-07-15 DIAGNOSIS — I82442 Acute embolism and thrombosis of left tibial vein: Secondary | ICD-10-CM | POA: Diagnosis not present

## 2019-07-15 MED ORDER — RIVAROXABAN (XARELTO) VTE STARTER PACK (15 & 20 MG): ORAL_TABLET | ORAL | 0 refills | Status: DC

## 2019-07-15 NOTE — Progress Notes (Signed)
Spoke to patient's podiatrist today, Dr Steffanie Rainwater.  She is s/p LLE surgery and was evaluated yesterday for lower extremity pain. Venous u/s performed today and was notable for 2 clots in the left lower extremity.  I am starting her on a Xarelto starter pack as this does seem to be provoked by recent surgery and immobilization.  A coupon voucher for $0 co-pay provided to the patient today.  I was able to directly visualize her right lower extremity which does show redness and edema.  She was in no respiratory distress.  Denied any chest pain, shortness of breath, change in exercise tolerance or hemoptysis.  No tachycardia.  In office follow-up scheduled for Monday.  Patient aware of red flag signs and symptoms warranting further evaluation.  US Venous Img Lower Unilateral Left (DVT)  Result Date: 07/15/2019 CLINICAL DATA:  Left lower extremity pain and edema.  Recent surgery EXAM: LEFT LOWER EXTREMITY VENOUS DOPPLER ULTRASOUND TECHNIQUE: Gray-scale sonography with graded compression, as well as color Doppler and duplex ultrasound were performed to evaluate the lower extremity deep venous systems from the level of the common femoral vein and including the common femoral, femoral, profunda femoral, popliteal and calf veins including the posterior tibial, peroneal and gastrocnemius veins when visible. The superficial great saphenous vein was also interrogated. Spectral Doppler was utilized to evaluate flow at rest and with distal augmentation maneuvers in the common femoral, femoral and popliteal veins. COMPARISON:  None. FINDINGS: Contralateral Common Femoral Vein: Respiratory phasicity is normal and symmetric with the symptomatic side. No evidence of thrombus. Normal compressibility. Common Femoral Vein: No evidence of thrombus. Normal compressibility, respiratory phasicity and response to augmentation. Saphenofemoral Junction: No evidence of thrombus. Normal compressibility and flow on color Doppler imaging.  Profunda Femoral Vein: No evidence of thrombus. Normal compressibility and flow on color Doppler imaging. Femoral Vein: No evidence of thrombus. Normal compressibility, respiratory phasicity and response to augmentation. Popliteal Vein: Occlusive echogenic thrombus. Vessel is noncompressible with no appreciable flow. Anterior Tibial Vein: Occlusive echogenic thrombus. Vessel is noncompressible with no appreciable flow. Posterior Tibial and Peroneal veins: No evidence of thrombus. Normal compressibility and flow on color Doppler imaging. Superficial Great Saphenous Vein: No evidence of thrombus. Normal compressibility. Venous Reflux:  None. Other Findings:  None. IMPRESSION: Positive for left lower extremity DVT involving the popliteal vein. Anterior tibial vein is also occluded. These results will be called to the ordering clinician or representative by the Radiologist Assistant, and communication documented in the PACS or [employer]. Electronically Signed   By: Davina Poke D.O.   On: 07/15/2019 11:49    Meds ordered this encounter  Medications  . RIVAROXABAN (XARELTO) VTE STARTER PACK (15 & 20 MG TABLETS)    Sig: Follow package directions: Take one 15mg  tablet by mouth twice a day. On day 22, switch to one 20mg  tablet once a day. Take with food.    Dispense:  51 each    Refill:  0

## 2019-07-20 ENCOUNTER — Other Ambulatory Visit: Payer: Self-pay

## 2019-07-20 ENCOUNTER — Ambulatory Visit: Payer: BC Managed Care – PPO | Admitting: Family Medicine

## 2019-07-20 ENCOUNTER — Encounter: Payer: Self-pay | Admitting: Family Medicine

## 2019-07-20 VITALS — BP 126/82 | HR 80 | Temp 97.9°F | Ht 64.0 in | Wt 188.0 lb

## 2019-07-20 DIAGNOSIS — E6609 Other obesity due to excess calories: Secondary | ICD-10-CM

## 2019-07-20 DIAGNOSIS — R635 Abnormal weight gain: Secondary | ICD-10-CM | POA: Diagnosis not present

## 2019-07-20 DIAGNOSIS — Z7901 Long term (current) use of anticoagulants: Secondary | ICD-10-CM | POA: Diagnosis not present

## 2019-07-20 DIAGNOSIS — Z6832 Body mass index (BMI) 32.0-32.9, adult: Secondary | ICD-10-CM | POA: Diagnosis not present

## 2019-07-20 DIAGNOSIS — I82432 Acute embolism and thrombosis of left popliteal vein: Secondary | ICD-10-CM | POA: Diagnosis not present

## 2019-07-20 LAB — BAYER DCA HB A1C WAIVED: HB A1C (BAYER DCA - WAIVED): 6.2 % (ref <7.0)

## 2019-07-20 NOTE — Progress Notes (Signed)
Subjective: CC: Follow-up DVT PCP: Terald Sleeper, PA-C [id number] L Sessler is a 54 y.o. female presenting to clinic today for:  1.  Acute DVT of the left lower extremity Patient was found to have an acute DVT of the left lower extremity involving the popliteal vein and anterior tibial vein that apparently was a complication of a surgery in the left foot.  Her podiatrist reached out to me last week and I started her on Xarelto.  So far, the swelling has improved some with the Xarelto.  She continues to have some discomfort and swelling by the end of the day as she has not really been able to sit down and keep leg elevated at work.  She is trying to stay active and has tried to avoid massaging the area because she does not want a piece to break off.  She is tolerating the Xarelto without difficulty.  She did start her menstrual cycle and has been bleeding quite a bit more than her normal (which is spotting).  She reports mild dizziness but no balance issues.  Does not report any chest pain, shortness of breath.  She had a mild intermittent cough that has been present since day after surgery.  Denies any drainage or acid reflux symptoms.  No hemoptysis.   ROS: Per HPI  Allergies  Allergen Reactions  . Hyoscyamine Anaphylaxis  . Caffeine     Rapid heart rate.  . Doxycycline     Racing heart.  Marland Kitchen Nexium [Esomeprazole]     Swollen throat   No past medical history on file.  Current Outpatient Medications:  .  diazepam (VALIUM) 5 MG tablet, Take 1 tablet (5 mg total) by mouth every 12 (twelve) hours as needed for anxiety., Disp: 30 tablet, Rfl: 0 .  meclizine (ANTIVERT) 25 MG tablet, Take 25 mg by mouth as needed for dizziness., Disp: , Rfl:  .  Minocycline HCl 90 MG TB24, Take 1 tablet by mouth daily. Only takes prn for break outs, Disp: , Rfl:  .  RIVAROXABAN (XARELTO) VTE STARTER PACK (15 & 20 MG TABLETS), Follow package directions: Take one '15mg'$  tablet by mouth twice a day. On day 22,  switch to one '20mg'$  tablet once a day. Take with food., Disp: 51 each, Rfl: 0 Social History   Socioeconomic History  . Marital status: Married    Spouse name: Not on file  . Number of children: 2  . Years of education: some college  . Highest education level: Not on file  Occupational History  . 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 Smoking status: Never Smoker  . Smokeless tobacco: Never Used  Substance and Sexual Activity  . Alcohol use: Yes    Comment: rare-twice monthly or less  . Drug use: No  . Sexual activity: Yes    Birth control/protection: I.U.D.  Other Topics Concern  . Not on file  Social History Narrative   Lives with spouse   Caffeine use: no caffeine    Left handed    Social Determinants of Health   Financial Resource Strain:   . Difficulty of Paying Living Expenses:   Food Insecurity:   . Worried About Charity fundraiser in the Last Year:   . Arboriculturist in the Last Year:   Transportation Needs:   . Film/video editor (Medical):   Marland Kitchen Lack of Transportation (Non-Medical):   Physical Activity:   . Days of Exercise per Week:   .  Minutes of Exercise per Session:   Stress:   . Feeling of Stress :   Social Connections:   . Frequency of Communication with Friends and Family:   . Frequency of Social Gatherings with Friends and Family:   . Attends Religious Services:   . Active Member of Clubs or Organizations:   . Attends Archivist Meetings:   Marland Kitchen Marital Status:   Intimate Partner Violence:   . Fear of Current or Ex-Partner:   . Emotionally Abused:   Marland Kitchen Physically Abused:   . Sexually Abused:    Family History  Problem Relation Age of Onset  . Hypertension Father     Objective: Office vital signs reviewed. BP 126/82   Pulse 80   Temp 97.9 F (36.6 C)   Ht _0  (1.626 m)   Wt 188 lb (85.3 kg)   LMP 05/03/2017   SpO2 96%   BMI 32.27 kg/m   Physical Examination:  General: Awake, alert, well nourished, well  appearing. No acute distress HEENT: Normal    Neck: No masses palpated. No lymphadenopathy; no thyromegaly, goiter or palpable thyroid masses    Ears: Tympanic membranes intact, normal light reflex, no erythema, no bulging    Eyes: PERRLA, extraocular membranes intact, sclera white    Nose: nasal turbinates moist, no nasal discharge    Throat: moist mucus membranes, no erythema, no tonsillar exudate.  Mild cobblestone appearance of the oropharynx noted.  Airway is patent Cardio: regular rate and rhythm, S1S2 heard, no murmurs appreciated Pulm: clear to auscultation bilaterally, no wheezes, rhonchi or rales; normal work of breathing on room air Extremities: warm, well perfused, No edema, cyanosis or clubbing; +2 pulses bilaterally MSK: antalgic gait and station  Left lower extremity: Mild tenderness palpation to the posterior calf.  Her left calf is 43 cm in girth while her right is 46 cm in girth.  (Apparently this is a chronic issue as she has a history of patellar injury in the past on the right).  No pitting edema appreciated.  Assessment/ Plan: 54 y.o. female   1. Acute deep vein thrombosis (DVT) of popliteal vein of left lower extremity (HCC) Stable.  Compliant with Xarelto.  I would like her to follow-up with me in about 2 to 3 weeks for recheck.  In the interim, I am going to go ahead and put her out of work for 2 weeks so that she can keep leg elevated when she is seated.  I have encouraged her to continue to walk around as tolerated.  2. On anticoagulant therapy Check CBC given increased bleeding during menses - CBC  3. Class 1 obesity due to excess calories without serious comorbidity with body mass index (BMI) of 32.0 to 32.9 in adult Check nonfasting labs - Bayer DCA Hb A1c Waived - CMP14+EGFR - Thyroid Panel With TSH - Lipid Panel   No orders of the defined types were placed in this encounter.  No orders of the defined types were placed in this encounter.    [name], DO [place] [phone]

## 2019-07-20 NOTE — Patient Instructions (Signed)

## 2019-07-21 LAB — LIPID PANEL
Chol/HDL Ratio: 3.4 ratio (ref 0.0–4.4)
Cholesterol, Total: 198 mg/dL (ref 100–199)
HDL: 59 mg/dL (ref >39)
LDL Chol Calc (NIH): 118 mg/dL — ABNORMAL HIGH (ref 0–99)
Triglycerides: 119 mg/dL (ref 0–149)
VLDL Cholesterol Cal: 21 mg/dL (ref 5–40)

## 2019-07-21 LAB — CMP14+EGFR
ALT: 10 IU/L (ref 0–32)
AST: 15 IU/L (ref 0–40)
Albumin/Globulin Ratio: 1.5 (ref 1.2–2.2)
Albumin: 4.4 g/dL (ref 3.8–4.9)
Alkaline Phosphatase: 130 IU/L — ABNORMAL HIGH (ref 39–117)
BUN/Creatinine Ratio: 14 (ref 9–23)
BUN: 10 mg/dL (ref 6–24)
Bilirubin Total: 0.3 mg/dL (ref 0.0–1.2)
CO2: 24 mmol/L (ref 20–29)
Calcium: 9.3 mg/dL (ref 8.7–10.2)
Chloride: 103 mmol/L (ref 96–106)
Creatinine, Ser: 0.69 mg/dL (ref 0.57–1.00)
GFR calc Af Amer: 114 mL/min/{1.73_m2} (ref >59)
GFR calc non Af Amer: 99 mL/min/{1.73_m2} (ref >59)
Globulin, Total: 3 g/dL (ref 1.5–4.5)
Glucose: 109 mg/dL — ABNORMAL HIGH (ref 65–99)
Potassium: 3.9 mmol/L (ref 3.5–5.2)
Sodium: 140 mmol/L (ref 134–144)
Total Protein: 7.4 g/dL (ref 6.0–8.5)

## 2019-07-21 LAB — CBC
Hematocrit: 40.5 % (ref 34.0–46.6)
Hemoglobin: 13.5 g/dL (ref 11.1–15.9)
MCH: 28.1 pg (ref 26.6–33.0)
MCHC: 33.3 g/dL (ref 31.5–35.7)
MCV: 84 fL (ref 79–97)
Platelets: 365 10*3/uL (ref 150–450)
RBC: 4.81 x10E6/uL (ref 3.77–5.28)
RDW: 12.9 % (ref 11.7–15.4)
WBC: 7.2 10*3/uL (ref 3.4–10.8)

## 2019-07-21 LAB — THYROID PANEL WITH TSH
Free Thyroxine Index: 1.7 (ref 1.2–4.9)
T3 Uptake Ratio: 25 % (ref 24–39)
T4, Total: 6.7 ug/dL (ref 4.5–12.0)
TSH: 1.74 u[IU]/mL (ref 0.450–4.500)

## 2019-07-22 ENCOUNTER — Ambulatory Visit (HOSPITAL_COMMUNITY): Payer: BC Managed Care – PPO

## 2019-07-23 ENCOUNTER — Encounter: Payer: Self-pay | Admitting: Family Medicine

## 2019-07-28 DIAGNOSIS — Z1231 Encounter for screening mammogram for malignant neoplasm of breast: Secondary | ICD-10-CM | POA: Diagnosis not present

## 2019-08-03 ENCOUNTER — Ambulatory Visit: Payer: BC Managed Care – PPO | Admitting: Family Medicine

## 2019-08-03 ENCOUNTER — Encounter: Payer: Self-pay | Admitting: Family Medicine

## 2019-08-03 ENCOUNTER — Other Ambulatory Visit: Payer: Self-pay

## 2019-08-03 VITALS — BP 124/79 | HR 80 | Temp 99.0°F | Ht 64.0 in | Wt 188.0 lb

## 2019-08-03 DIAGNOSIS — R739 Hyperglycemia, unspecified: Secondary | ICD-10-CM

## 2019-08-03 DIAGNOSIS — I82432 Acute embolism and thrombosis of left popliteal vein: Secondary | ICD-10-CM | POA: Diagnosis not present

## 2019-08-03 DIAGNOSIS — R928 Other abnormal and inconclusive findings on diagnostic imaging of breast: Secondary | ICD-10-CM | POA: Diagnosis not present

## 2019-08-03 DIAGNOSIS — Z7901 Long term (current) use of anticoagulants: Secondary | ICD-10-CM | POA: Diagnosis not present

## 2019-08-03 LAB — BAYER DCA HB A1C WAIVED: HB A1C (BAYER DCA - WAIVED): 6.3 % (ref <7.0)

## 2019-08-03 LAB — HEMOGLOBIN, FINGERSTICK: Hemoglobin: 12.4 g/dL (ref 11.1–15.9)

## 2019-08-03 MED ORDER — RIVAROXABAN 20 MG PO TABS: 20.0000 mg | ORAL_TABLET | Freq: Every day | ORAL | 4 refills | Status: DC

## 2019-08-03 NOTE — Progress Notes (Signed)
Subjective: CC: f/u DVT PCP: [name], DO [id number] Stephanie Brewer is a 54 y.o. female presenting to clinic today for:  1. DVT Patient with still some cramping in the left calf.  She is trying to ambulate frequently.  She still has trace swelling and discoloration left lower extremity that is concerning to her.  She reports compliance with anticoagulant.  She does report some increased vaginal bleeding and clotting but does not report any dizziness, shortness of breath or heart palpitations  2.  Abnormal mammogram Patient reports that her mammogram was abnormal on the right recently.  There are plans to repeat ultrasound and mammogram on Wednesday.  There is a possible need for biopsy as it "changed quite a bit" since last year.  Does not report any nipple discharge, inversion or palpable masses.   ROS: Per HPI  Allergies  Allergen Reactions  . Hyoscyamine Anaphylaxis  . Caffeine     Rapid heart rate.  . Doxycycline     Racing heart.  Marland Kitchen Nexium [Esomeprazole]     Swollen throat   No past medical history on file.  Current Outpatient Medications:  .  diazepam (VALIUM) 5 MG tablet, Take 1 tablet (5 mg total) by mouth every 12 (twelve) hours as needed for anxiety., Disp: 30 tablet, Rfl: 0 .  meclizine (ANTIVERT) 25 MG tablet, Take 25 mg by mouth as needed for dizziness., Disp: , Rfl:  .  Minocycline HCl 90 MG TB24, Take 1 tablet by mouth daily. Only takes prn for break outs, Disp: , Rfl:  Social History   Socioeconomic History  . Marital status: Married    Spouse name: Not on file  . Number of children: 2  . Years of education: some college  . Highest education level: Not on file  Occupational History  . 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 Smoking status: Never Smoker  . Smokeless tobacco: Never Used  Substance and Sexual Activity  . Alcohol use: Yes    Comment: rare-twice monthly or less  . Drug use: No  . Sexual activity: Yes    Birth control/protection:  I.U.D.  Other Topics Concern  . Not on file  Social History Narrative   Lives with spouse   Caffeine use: no caffeine    Left handed    Social Determinants of Health   Financial Resource Strain:   . Difficulty of Paying Living Expenses:   Food Insecurity:   . Worried About Charity fundraiser in the Last Year:   . Arboriculturist in the Last Year:   Transportation Needs:   . Film/video editor (Medical):   Marland Kitchen Lack of Transportation (Non-Medical):   Physical Activity:   . Days of Exercise per Week:   . Minutes of Exercise per Session:   Stress:   . Feeling of Stress :   Social Connections:   . Frequency of Communication with Friends and Family:   . Frequency of Social Gatherings with Friends and Family:   . Attends Religious Services:   . Active Member of Clubs or Organizations:   . Attends Archivist Meetings:   Marland Kitchen Marital Status:   Intimate Partner Violence:   . Fear of Current or Ex-Partner:   . Emotionally Abused:   Marland Kitchen Physically Abused:   . Sexually Abused:    Family History  Problem Relation Age of Onset  . Hypertension Father     Objective: Office vital signs reviewed. BP 124/79  Pulse 80   Temp 99 F (37.2 C) (Temporal)   Ht 5\' 4"  (1.626 m)   Wt 188 lb (85.3 kg)   LMP 05/03/2017   SpO2 97%   BMI 32.27 kg/m   Physical Examination:  General: Awake, alert, well nourished, No acute distress HEENT: Normal, sclera white, MMM. No conjunctival pallor Cardio: regular rate and rhythm, S1S2 heard, no murmurs appreciated Pulm: clear to auscultation bilaterally, no wheezes, rhonchi or rales; normal work of breathing on room air Extremities: warm, well perfused, trace left sided Non pitting edema, No cyanosis or clubbing; +2 pedal pulses bilaterally MSK: normal gait and station  Assessment/ Plan: 54 y.o. female   1. Acute deep vein thrombosis (DVT) of popliteal vein of left lower extremity (HCC) Tolerating Xarelto but has been having some  increased vaginal bleeding.  She is to start her 20 mg dosing within the next couple of days.  We will check a hemoglobin level.  Plan for total of 3-month treatment for DVT. - rivaroxaban (XARELTO) 20 MG TABS tablet; Take 1 tablet (20 mg total) by mouth daily with supper. x5 months. Then stop.  Dispense: 30 tablet; Refill: 4 - Hemoglobin, fingerstick  2. On anticoagulant therapy - rivaroxaban (XARELTO) 20 MG TABS tablet; Take 1 tablet (20 mg total) by mouth daily with supper. x5 months. Then stop.  Dispense: 30 tablet; Refill: 4 - Hemoglobin, fingerstick  3. Abnormal mammogram Right side with recent abnormality.  Has repeat mammogram with ultrasound and possible need for biopsy in the future.  Would like not to hold anticoagulation for biopsy if possible.  This has been scheduled for Wednesday.  Biopsy date to be determined as needed.  I have extended her FMLA to cover for this issue.  4. Elevated serum glucose Check A1c given strong family history of diabetes - Bayer DCA Hb A1c Waived   No orders of the defined types were placed in this encounter.  No orders of the defined types were placed in this encounter.    [name], DO [place] [phone]

## 2019-08-05 DIAGNOSIS — N6311 Unspecified lump in the right breast, upper outer quadrant: Secondary | ICD-10-CM | POA: Diagnosis not present

## 2019-08-05 DIAGNOSIS — N6310 Unspecified lump in the right breast, unspecified quadrant: Secondary | ICD-10-CM | POA: Diagnosis not present

## 2019-08-05 DIAGNOSIS — R928 Other abnormal and inconclusive findings on diagnostic imaging of breast: Secondary | ICD-10-CM | POA: Diagnosis not present

## 2019-08-12 ENCOUNTER — Encounter: Payer: Self-pay | Admitting: Hematology and Oncology

## 2019-08-12 DIAGNOSIS — C50011 Malignant neoplasm of nipple and areola, right female breast: Secondary | ICD-10-CM | POA: Diagnosis not present

## 2019-08-12 DIAGNOSIS — D241 Benign neoplasm of right breast: Secondary | ICD-10-CM | POA: Diagnosis not present

## 2019-08-12 DIAGNOSIS — R590 Localized enlarged lymph nodes: Secondary | ICD-10-CM | POA: Diagnosis not present

## 2019-08-12 DIAGNOSIS — N6311 Unspecified lump in the right breast, upper outer quadrant: Secondary | ICD-10-CM | POA: Diagnosis not present

## 2019-08-12 DIAGNOSIS — C50111 Malignant neoplasm of central portion of right female breast: Secondary | ICD-10-CM | POA: Diagnosis not present

## 2019-08-12 DIAGNOSIS — N6341 Unspecified lump in right breast, subareolar: Secondary | ICD-10-CM | POA: Diagnosis not present

## 2019-08-12 DIAGNOSIS — Z170 Estrogen receptor positive status [ER+]: Secondary | ICD-10-CM | POA: Diagnosis not present

## 2019-08-12 DIAGNOSIS — C773 Secondary and unspecified malignant neoplasm of axilla and upper limb lymph nodes: Secondary | ICD-10-CM | POA: Diagnosis not present

## 2019-08-13 DIAGNOSIS — Z6832 Body mass index (BMI) 32.0-32.9, adult: Secondary | ICD-10-CM | POA: Diagnosis not present

## 2019-08-13 DIAGNOSIS — C773 Secondary and unspecified malignant neoplasm of axilla and upper limb lymph nodes: Secondary | ICD-10-CM | POA: Diagnosis not present

## 2019-08-13 DIAGNOSIS — C50911 Malignant neoplasm of unspecified site of right female breast: Secondary | ICD-10-CM | POA: Diagnosis not present

## 2019-08-18 ENCOUNTER — Telehealth: Payer: Self-pay | Admitting: *Deleted

## 2019-08-18 ENCOUNTER — Other Ambulatory Visit: Payer: Self-pay | Admitting: Family Medicine

## 2019-08-18 DIAGNOSIS — C50911 Malignant neoplasm of unspecified site of right female breast: Secondary | ICD-10-CM | POA: Diagnosis not present

## 2019-08-18 DIAGNOSIS — C773 Secondary and unspecified malignant neoplasm of axilla and upper limb lymph nodes: Secondary | ICD-10-CM | POA: Diagnosis not present

## 2019-08-18 NOTE — Telephone Encounter (Signed)
Received call from pt stating she has been dx with breast cancer and would like to see [name] for medical oncology. Scheduled and confirmed appt for 08/20/19 at 1pm.

## 2019-08-19 NOTE — Progress Notes (Signed)
[facility] NOTE  Patient Care Team: [name], DO as PCP - General (Family Medicine) Mauro Kaufmann, RN as Oncology Nurse Navigator Rockwell Germany, RN as Oncology Nurse Navigator  CHIEF COMPLAINTS/PURPOSE OF CONSULTATION:  Newly diagnosed breast cancer  HISTORY OF PRESENTING ILLNESS:  Stephanie Brewer 54 y.o. female is here because of recent diagnosis of right breast cancer. Screening mammogram detected a right breast abnormality. 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 She also has a history of left lower extremity DVT and is currently on anticoagulation with Xarelto. She presents to the clinic today for initial evaluation and discussion of treatment options.  She is accompanied by her husband.  I reviewed her records extensively and collaborated the history with the patient.  SUMMARY OF ONCOLOGIC HISTORY: Oncology History  Malignant neoplasm of upper-outer quadrant of right breast in female, estrogen receptor positive ([place])  07/28/2019 Initial Diagnosis   Screening mammogram detected right breast mass 11:30 position subareolar 1.3 cm, indeterminate 5 mm mass: Biopsy fibroadenoma, right axillary lymph node present, biopsy of the lymph node and the mass revealed grade 2 IDC ER 10%, PR 0%, Ki-67 45%, HER-2 +3+ by [facility]   08/20/2019 Cancer Staging   Staging form: Breast, AJCC 8th Edition - Clinical stage from 08/20/2019: Stage IIA (cT1c, cN1, cM0, G2, ER+, PR-, HER2+) - Signed by Nicholas Lose, MD on 08/20/2019   08/28/2019 -  Chemotherapy   The patient had palonosetron (ALOXI) injection 0.25 mg, 0.25 mg, Intravenous,  Once, 0 of 6 cycles pegfilgrastim-jmdb (FULPHILA) injection 6 mg, 6 mg, Subcutaneous,  Once, 0 of 6 cycles CARBOplatin (PARAPLATIN) 700 mg in sodium chloride 0.9 % 250 mL chemo infusion, 700 mg (100 % of original dose 700 mg), Intravenous,  Once, 0 of 6 cycles Dose modification: 700 mg (original  dose 700 mg, Cycle 1) DOCEtaxel (TAXOTERE) 150 mg in sodium chloride 0.9 % 250 mL chemo infusion, 75 mg/m2 = 150 mg, Intravenous,  Once, 0 of 6 cycles fosaprepitant (EMEND) 150 mg in sodium chloride 0.9 % 145 mL IVPB, 150 mg, Intravenous,  Once, 0 of 6 cycles pertuzumab (PERJETA) 420 mg in sodium chloride 0.9 % 250 mL chemo infusion, 420 mg (100 % of original dose 420 mg), Intravenous, Once, 0 of 6 cycles Dose modification: 420 mg (original dose 420 mg, Cycle 1, Reason: Provider Judgment) trastuzumab-dkst (OGIVRI) 672 mg in sodium chloride 0.9 % 250 mL chemo infusion, 8 mg/kg = 672 mg, Intravenous,  Once, 0 of 6 cycles  for chemotherapy treatment.      MEDICAL HISTORY: DVT diagnosed after foot surgery February 2021, currently on anticoagulation No past medical history on file.  SURGICAL HISTORY: Past Surgical History:  Procedure Laterality Date  . BREAST SURGERY     Augmentation 2003  . HERNIA REPAIR  2002  Abdominoplasty Breast implants Foot surgery  SOCIAL HISTORY: Social History   Socioeconomic History  . Marital status: Married    Spouse name: Not on file  . Number of children: 2  . Years of education: some college  . Highest education level: Not on file  Occupational History  . 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 Smoking status: Never Smoker  . Smokeless tobacco: Never Used  Substance and Sexual Activity  . Alcohol use: Yes    Comment: rare-twice monthly or less  . Drug use: No  . Sexual activity: Yes    Birth control/protection: I.U.D.  Other  Topics Concern  . Not on file  Social History Narrative   Lives with spouse   Caffeine use: no caffeine    Left handed    Social Determinants of Health   Financial Resource Strain:   . Difficulty of Paying Living Expenses:   Food Insecurity:   . Worried About Charity fundraiser in the Last Year:   . Arboriculturist in the Last Year:   Transportation Needs:   . Film/video editor (Medical):   Marland Kitchen Lack of  Transportation (Non-Medical):   Physical Activity:   . Days of Exercise per Week:   . Minutes of Exercise per Session:   Stress:   . Feeling of Stress :   Social Connections:   . Frequency of Communication with Friends and Family:   . Frequency of Social Gatherings with Friends and Family:   . Attends Religious Services:   . Active Member of Clubs or Organizations:   . Attends Archivist Meetings:   Marland Kitchen Marital Status:   Intimate Partner Violence:   . Fear of Current or Ex-Partner:   . Emotionally Abused:   Marland Kitchen Physically Abused:   . Sexually Abused:     FAMILY HISTORY: Family History  Problem Relation Age of Onset  . Hypertension Father     ALLERGIES:  is allergic to hyoscyamine; caffeine; doxycycline; and nexium [esomeprazole].  MEDICATIONS:  Current Outpatient Medications  Medication Sig Dispense Refill  . dexamethasone (DECADRON) 4 MG tablet Take 1 tablet (4 mg total) by mouth daily. Take 1 tablet day before chemo and 1 tablet day after chemo with food 12 tablet 0  . diazepam (VALIUM) 5 MG tablet Take 1 tablet (5 mg total) by mouth every 12 (twelve) hours as needed for anxiety. 30 tablet 0  . enoxaparin (LOVENOX) 80 MG/0.8ML injection Inject 0.8 mLs (80 mg total) into the skin every 12 (twelve) hours. 3.2 mL 0  . lidocaine-prilocaine (EMLA) cream Apply to affected area once 30 g 3  . LORazepam (ATIVAN) 0.5 MG tablet Take 1 tablet (0.5 mg total) by mouth at bedtime as needed for sleep. 30 tablet 0  . meclizine (ANTIVERT) 25 MG tablet Take 25 mg by mouth as needed for dizziness.    . Minocycline HCl 90 MG TB24 Take 1 tablet by mouth daily. Only takes prn for break outs    . ondansetron (ZOFRAN) 8 MG tablet Take 1 tablet (8 mg total) by mouth 2 (two) times daily as needed (Nausea or vomiting). Begin 4 days after chemotherapy. 30 tablet 1  . prochlorperazine (COMPAZINE) 10 MG tablet Take 1 tablet (10 mg total) by mouth every 6 (six) hours as needed (Nausea or vomiting).  30 tablet 1  . rivaroxaban (XARELTO) 20 MG TABS tablet Take 1 tablet (20 mg total) by mouth daily with supper. x5 months. Then stop. 30 tablet 4   No current facility-administered medications for this visit.    REVIEW OF SYSTEMS:   Constitutional: Denies fevers, chills or abnormal night sweats Eyes: Denies blurriness of vision, double vision or watery eyes Ears, nose, mouth, throat, and face: Denies mucositis or sore throat Respiratory: Denies cough, dyspnea or wheezes Cardiovascular: Denies palpitation, chest discomfort or lower extremity swelling Gastrointestinal:  Denies nausea, heartburn or change in bowel habits Skin: Denies abnormal skin rashes Lymphatics: Denies new lymphadenopathy or easy bruising Neurological:Denies numbness, tingling or new weaknesses Behavioral/Psych: Mood is stable, no new changes  Breast: Denies any palpable lumps or discharge All other systems  were reviewed with the patient and are negative.  PHYSICAL EXAMINATION: ECOG PERFORMANCE STATUS: 0 - Asymptomatic  Vitals:   08/20/19 1306  BP: (!) 141/86  Pulse: 73  Resp: 18  Temp: 98.7 F (37.1 C)  SpO2: 100%   Filed Weights   08/20/19 1306  Weight: 185 lb 12.8 oz (84.3 kg)    GENERAL:alert, no distress and comfortable SKIN: skin color, texture, turgor are normal, no rashes or significant lesions EYES: normal, conjunctiva are pink and non-injected, sclera clear OROPHARYNX:no exudate, no erythema and lips, buccal mucosa, and tongue normal  NECK: supple, thyroid normal size, non-tender, without nodularity LYMPH:  no palpable lymphadenopathy in the cervical, axillary or inguinal LUNGS: clear to auscultation and percussion with normal breathing effort HEART: regular rate & rhythm and no murmurs and no lower extremity edema ABDOMEN:abdomen soft, non-tender and normal bowel sounds Musculoskeletal:no cyanosis of digits and no clubbing  PSYCH: alert & oriented x 3 with fluent speech NEURO: no focal  motor/sensory deficits   LABORATORY DATA:  I have reviewed the data as listed Lab Results  Component Value Date   WBC 7.2 07/20/2019   HGB 13.5 07/20/2019   HCT 40.5 07/20/2019   MCV 84 07/20/2019   PLT 365 07/20/2019   Lab Results  Component Value Date   NA 140 07/20/2019   K 3.9 07/20/2019   CL 103 07/20/2019   CO2 24 07/20/2019    RADIOGRAPHIC STUDIES: I have personally reviewed the radiological reports and agreed with the findings in the report.  ASSESSMENT AND PLAN:  Malignant neoplasm of upper-outer quadrant of right breast in female, estrogen receptor positive ([place]) 07/28/2019:Screening mammogram detected right breast mass 11:30 position subareolar 1.3 cm, indeterminate 5 mm mass: Biopsy fibroadenoma, right axillary lymph node present, biopsy of the lymph node and the mass revealed grade 2 IDC ER 10%, PR 0%, Ki-67 45%, HER-2 +3+ by IHC  T1 cN1 M0 stage IIa  Pathology and radiology counseling: Discussed with the patient, the details of pathology including the type of breast cancer,the clinical staging, the significance of ER, PR and HER-2/neu receptors and the implications for treatment. After reviewing the pathology in detail, we proceeded to discuss the different treatment options between surgery, radiation, chemotherapy, antiestrogen therapies.  Recommendation based on multidisciplinary tumor board: 1. Neoadjuvant chemotherapy with TCH Perjeta 6 cycles followed by Herceptin Perjeta versus Kadcyla maintenance for 1 year 2. Followed by breast conserving surgery with sentinel lymph node study 3. Followed by adjuvant radiation therapy if patient had lumpectomy  Chemotherapy Counseling: I discussed the risks and benefits of chemotherapy including the risks of nausea/ vomiting, risk of infection from low WBC count, fatigue due to chemo or anemia, bruising or bleeding due to low platelets, mouth sores, loss/ change in taste and decreased appetite. Liver and kidney function will  be monitored through out chemotherapy as abnormalities in liver and kidney function may be a side effect of treatment. Cardiac dysfunction due to Herceptin and Perjeta were discussed in detail. Risk of permanent bone marrow dysfunction due to chemo were also discussed.  Plan: 1. Port placement 2. Echocardiogram 3. Chemotherapy class 4. Breast MRI  Return to clinic in 2 weeks to start chemotherapy. Patient agreed to participate in upbeat and neuropathy studies. All questions were answered. The patient knows to call the clinic with any problems, questions or concerns.   Rulon Eisenmenger, MD, MPH 08/20/2019    I, Molly Dorshimer, am acting as scribe for Nicholas Lose, MD.  I have reviewed  the above documentation for accuracy and completeness, and I agree with the above.

## 2019-08-20 ENCOUNTER — Encounter: Payer: Self-pay | Admitting: *Deleted

## 2019-08-20 ENCOUNTER — Other Ambulatory Visit: Payer: Self-pay | Admitting: *Deleted

## 2019-08-20 ENCOUNTER — Inpatient Hospital Stay: Payer: BC Managed Care – PPO | Attending: Hematology and Oncology | Admitting: Hematology and Oncology

## 2019-08-20 ENCOUNTER — Other Ambulatory Visit: Payer: Self-pay

## 2019-08-20 VITALS — BP 141/86 | HR 73 | Temp 98.7°F | Resp 18 | Ht 64.0 in | Wt 185.8 lb

## 2019-08-20 DIAGNOSIS — Z170 Estrogen receptor positive status [ER+]: Secondary | ICD-10-CM

## 2019-08-20 DIAGNOSIS — Z86718 Personal history of other venous thrombosis and embolism: Secondary | ICD-10-CM | POA: Insufficient documentation

## 2019-08-20 DIAGNOSIS — C50411 Malignant neoplasm of upper-outer quadrant of right female breast: Secondary | ICD-10-CM

## 2019-08-20 DIAGNOSIS — Z7901 Long term (current) use of anticoagulants: Secondary | ICD-10-CM | POA: Insufficient documentation

## 2019-08-20 MED ORDER — ONDANSETRON HCL 8 MG PO TABS: 8.0000 mg | ORAL_TABLET | Freq: Two times a day (BID) | ORAL | 1 refills | Status: DC | PRN

## 2019-08-20 MED ORDER — PROCHLORPERAZINE MALEATE 10 MG PO TABS: 10.0000 mg | ORAL_TABLET | Freq: Four times a day (QID) | ORAL | 1 refills | Status: DC | PRN

## 2019-08-20 MED ORDER — LIDOCAINE-PRILOCAINE 2.5-2.5 % EX CREA: TOPICAL_CREAM | CUTANEOUS | 3 refills | Status: DC

## 2019-08-20 MED ORDER — LORAZEPAM 0.5 MG PO TABS: 0.5000 mg | ORAL_TABLET | Freq: Every evening | ORAL | 0 refills | Status: DC | PRN

## 2019-08-20 MED ORDER — ENOXAPARIN SODIUM 80 MG/0.8ML ~~LOC~~ SOLN: 80.0000 mg | Freq: Two times a day (BID) | SUBCUTANEOUS | 0 refills | Status: DC

## 2019-08-20 MED ORDER — DEXAMETHASONE 4 MG PO TABS: 4.0000 mg | ORAL_TABLET | Freq: Every day | ORAL | 0 refills | Status: DC

## 2019-08-20 NOTE — Progress Notes (Signed)
Per MD pt to stop taking Xarelto 3 days prior to port insertion. Pt will take Lovenox 80 mg BID two days prior to surgery.  The day of surgery pt will not take any blood thinners and the day after surgery pt to resume Xarelto.  Prescription sent to pharmacy on file. MD reviewed instructions with pt.

## 2019-08-20 NOTE — Assessment & Plan Note (Signed)
07/28/2019:Screening mammogram detected right breast mass 11:30 position subareolar 1.3 cm, indeterminate 5 mm mass: Biopsy fibroadenoma, right axillary lymph node present, biopsy of the lymph node and the mass revealed grade 2 IDC ER 10%, PR 0%, Ki-67 45%, HER-2 +3+ by IHC  T1 cN1 M0 stage IIa  Pathology and radiology counseling: Discussed with the patient, the details of pathology including the type of breast cancer,the clinical staging, the significance of ER, PR and HER-2/neu receptors and the implications for treatment. After reviewing the pathology in detail, we proceeded to discuss the different treatment options between surgery, radiation, chemotherapy, antiestrogen therapies.  Recommendation based on multidisciplinary tumor board: 1. Neoadjuvant chemotherapy with TCH Perjeta 6 cycles followed by Herceptin Perjeta versus Kadcyla maintenance for 1 year 2. Followed by breast conserving surgery with sentinel lymph node study 3. Followed by adjuvant radiation therapy if patient had lumpectomy  Chemotherapy Counseling: I discussed the risks and benefits of chemotherapy including the risks of nausea/ vomiting, risk of infection from low WBC count, fatigue due to chemo or anemia, bruising or bleeding due to low platelets, mouth sores, loss/ change in taste and decreased appetite. Liver and kidney function will be monitored through out chemotherapy as abnormalities in liver and kidney function may be a side effect of treatment. Cardiac dysfunction due to Herceptin and Perjeta were discussed in detail. Risk of permanent bone marrow dysfunction due to chemo were also discussed.  Plan: 1. Port placement 2. Echocardiogram 3. Chemotherapy class 4. Breast MRI 5. CT chest abdomen pelvis and bone scan for staging  Return to clinic in 2 weeks to start chemotherapy.

## 2019-08-20 NOTE — Progress Notes (Signed)
START ON PATHWAY REGIMEN - Breast     A cycle is every 21 days:     Pertuzumab      Pertuzumab      Trastuzumab-xxxx      Trastuzumab-xxxx      Carboplatin      Docetaxel   **Always confirm dose/schedule in your pharmacy ordering system**  Patient Characteristics: Preoperative or Nonsurgical Candidate (Clinical Staging), Neoadjuvant Therapy followed by Surgery, Invasive Disease, Chemotherapy, HER2 Positive, ER Positive Therapeutic Status: Preoperative or Nonsurgical Candidate (Clinical Staging) AJCC M Category: cM0 AJCC Grade: G2 Breast Surgical Plan: Neoadjuvant Therapy followed by Surgery ER Status: Positive (+) AJCC 8 Stage Grouping: IIA HER2 Status: Positive (+) AJCC T Category: cT1c AJCC N Category: cN1 PR Status: Negative (-) Intent of Therapy: Curative Intent, Discussed with Patient

## 2019-08-20 NOTE — Research (Signed)
UPBEAT [id number] - UNDERSTANDING and PREDICTING BREAST CANCER EVENTS AFTER TREATMENT   S1714 - A PROSPECTIVE OBSERVATIONAL COHORT STUDY to DEVELOP A PREDICTIVE MODEL of TAXANE-INDUCED PERIPHERAL NEUROPATHY IN CANCER PATIENTS   DCP-001 USE OF A CLINICAL TRIAL SCREENING TOOL TO ADDRESS CANCER HEALTH DISPARITIES IN THE NCI COMMUNITY ONCOLOGY RESEARCH PROGRAM ([facility])  08/20/19 1420pm  Received referral from Dr. Gudena that patient desires information on two clinical research trials: UPBEAT and S1714. Met with Stephanie Brewer and [name] for approximately 20 minutes in exam room 28 to briefly review both studies; each study was reviewed separately for clarity. The purpose for each study, risks and benefits, participation requirements, costs and compensation for participating (payment applicable only to UPBEAT study) and optional study specimens were briefly reviewed and an opportunity to ask questions was provided. Stephanie Brewer denies symptomatic claustrophobia and feels she can tolerate the cardiac MRI requirement for UPBEAT. After discussion of the UPBEAT and S1714 studies, a brief overview of the DCP-001 study was also presented. Patient was advised participation in any study is completely voluntary and that she may withdraw at any time: patient verbalizes understanding. Study consents for the three studies listed above along with HIPPA forms were provided for further review, as well as my direct contact information. Stephanie Brewer states she anticipates getting her port and starting treatment next week, possibly as early as next Thursday or Friday. Advised that should she wish to participate, we will need to complete her baseline cardiac MRI and baseline assessments prior to treatment: she verbalizes understanding. The current plan is for Stephanie Brewer and Stephanie Brewer to take the information home for further discussion tonight and she will call me tomorrow with a decision for each study. Patient and her spouse were thanked for their time and  consideration of participation. Will verify study eligibility and plan to follow-up with Stephanie Brewer tomorrow.  Stephanie Brewer, BSN, RN, CIC 08/20/2019 2:58 PM  

## 2019-08-21 ENCOUNTER — Encounter (HOSPITAL_BASED_OUTPATIENT_CLINIC_OR_DEPARTMENT_OTHER): Payer: Self-pay | Admitting: General Surgery

## 2019-08-21 ENCOUNTER — Telehealth: Payer: Self-pay | Admitting: *Deleted

## 2019-08-21 ENCOUNTER — Telehealth: Payer: Self-pay

## 2019-08-21 ENCOUNTER — Telehealth: Payer: Self-pay | Admitting: Hematology and Oncology

## 2019-08-21 ENCOUNTER — Other Ambulatory Visit: Payer: Self-pay

## 2019-08-21 DIAGNOSIS — C50411 Malignant neoplasm of upper-outer quadrant of right female breast: Secondary | ICD-10-CM

## 2019-08-21 NOTE — Telephone Encounter (Signed)
Spoke to pt concerning new pt appt with [name]. Denies questions or concerns regarding dx or treatment care plan. Discussed next steps and confirmed future appts.

## 2019-08-21 NOTE — Telephone Encounter (Signed)
Scheduled per 5/20 sch message. Pt aware of appts. Noted to give pt updated calendar.

## 2019-08-21 NOTE — Telephone Encounter (Signed)
UPBEAT [id number] - UNDERSTANDING and PREDICTING BREAST CANCER EVENTS AFTER TREATMENT  [id number] - A PROSPECTIVE OBSERVATIONAL COHORT STUDY to DEVELOP A PREDICTIVE MODEL of TAXANE-INDUCED PERIPHERAL NEUROPATHY IN CANCER PATIENTS   DCP-001 USE OF A CLINICAL TRIAL SCREENING TOOL TO ADDRESS CANCER HEALTH DISPARITIES IN THE [place] ([facility])  08/21/19 0835am  Received call from [place] verbalizing interest in participating in the three studies listed above. Unfortunately, since [place] experienced a DVT last month (April 2021), she is not eligible for the UPBEAT study but can still participate in the S1714 neuropathy study and complete DCP if desired: she verbalizes understanding. Raighan Wunderlich that I will review her appointment schedule and set up a time for [place] to sign consent prior to her treatments set to start 08/28/19. Questions were answered and [name] notified of her eligibility. Current plan is to get a second confirmation of eligibility for the [phone] study and schedule a consenting visit.  Dionne Bucy. Sharlett Iles, BSN, RN, CIC 08/21/2019 11:06 AM

## 2019-08-24 ENCOUNTER — Encounter: Payer: Self-pay | Admitting: *Deleted

## 2019-08-25 ENCOUNTER — Ambulatory Visit (HOSPITAL_COMMUNITY)
Admission: RE | Admit: 2019-08-25 | Discharge: 2019-08-25 | Disposition: A | Discharge status: Home / Self Care | Payer: BC Managed Care – PPO | Source: Ambulatory Visit | Attending: Hematology and Oncology | Admitting: Hematology and Oncology

## 2019-08-25 ENCOUNTER — Other Ambulatory Visit: Payer: Self-pay

## 2019-08-25 ENCOUNTER — Inpatient Hospital Stay: Payer: BC Managed Care – PPO

## 2019-08-25 ENCOUNTER — Encounter: Payer: Self-pay | Admitting: Hematology and Oncology

## 2019-08-25 DIAGNOSIS — I517 Cardiomegaly: Secondary | ICD-10-CM | POA: Insufficient documentation

## 2019-08-25 DIAGNOSIS — Z170 Estrogen receptor positive status [ER+]: Secondary | ICD-10-CM

## 2019-08-25 DIAGNOSIS — Z01818 Encounter for other preprocedural examination: Secondary | ICD-10-CM | POA: Insufficient documentation

## 2019-08-25 DIAGNOSIS — C50411 Malignant neoplasm of upper-outer quadrant of right female breast: Secondary | ICD-10-CM | POA: Diagnosis not present

## 2019-08-25 DIAGNOSIS — I340 Nonrheumatic mitral (valve) insufficiency: Secondary | ICD-10-CM | POA: Diagnosis not present

## 2019-08-25 NOTE — Progress Notes (Signed)
Called pt to introduce myself as herFinancial Resource Specialist andtodiscuss copay assistance. Pt gave me consent to apply in herbehalf so I completed theonlineapplicationwith Civil engineer, contracting foundation for [employer]. The app is pending so I will notify herof the outcome once I receive it.Ialsoenrolledher in theGenentechBioOncology program. She wasapproved for $25,000 for Perjeta for12 monthsfrom5/25/21. Pt's responsibility will be as little as $5 per infusion.  Pt is overqualified for the [employer].  I will giveher my card on 08/26/19 for any questions or concernsshe may have in the future.

## 2019-08-25 NOTE — Progress Notes (Signed)
  Echocardiogram 2D Echocardiogram has been performed.  [name] 08/25/2019, 10:51 AM

## 2019-08-26 ENCOUNTER — Other Ambulatory Visit: Payer: Self-pay

## 2019-08-26 ENCOUNTER — Inpatient Hospital Stay: Payer: BC Managed Care – PPO

## 2019-08-26 ENCOUNTER — Ambulatory Visit
Admission: RE | Admit: 2019-08-26 | Discharge: 2019-08-26 | Disposition: A | Discharge status: Home / Self Care | Payer: BC Managed Care – PPO | Source: Ambulatory Visit | Attending: Hematology and Oncology | Admitting: Hematology and Oncology

## 2019-08-26 ENCOUNTER — Encounter: Payer: Self-pay | Admitting: Physical Therapy

## 2019-08-26 ENCOUNTER — Other Ambulatory Visit: Payer: Self-pay | Admitting: *Deleted

## 2019-08-26 DIAGNOSIS — C50911 Malignant neoplasm of unspecified site of right female breast: Secondary | ICD-10-CM | POA: Diagnosis not present

## 2019-08-26 DIAGNOSIS — C50411 Malignant neoplasm of upper-outer quadrant of right female breast: Secondary | ICD-10-CM

## 2019-08-26 DIAGNOSIS — C773 Secondary and unspecified malignant neoplasm of axilla and upper limb lymph nodes: Secondary | ICD-10-CM | POA: Diagnosis not present

## 2019-08-26 DIAGNOSIS — Z170 Estrogen receptor positive status [ER+]: Secondary | ICD-10-CM

## 2019-08-26 MED ORDER — GADOBUTROL 1 MMOL/ML IV SOLN
8.0000 mL | Freq: Once | INTRAVENOUS | Status: AC | PRN
  Administered 2019-08-26: 8 mL via INTRAVENOUS

## 2019-08-26 NOTE — Research (Signed)
S1714 STUDYING HOW CANCER TREATMENT AFFECTS THE NERVES IN YOUR HANDS AND FEET   DCP-001 USE OF A CLINICAL TRIAL SCREENING TOOL TO ADDRESS CANCER HEALTH DISPARITIES IN THE NCI COMMUNITY ONCOLOGY RESEARCH PROGRAM ([facility]ORP)   08/26/2019 1400PM  [id number] CONSENT: Met with Leveda Anna alone in the Clinical Research audit room for 60 minutes to discuss the S1714 study and potential DCP participation. Shawntae verbalizes that she and Octavia Bruckner have reviewed all the documents provided at our previous visit and verbalized continued interest in voluntarily participating in both studies [place] is not eligible for the [id number] UPBEAT study due to a recent DVT - see prior note). Eligibility for participation has been confirmed by myself and a second clinical research nurse, Otilio Miu. Both the consent and HIPAA documents were reviewed in full; she has no questions at this time. Consent form (protocol version date 02/08/19, [place] active date 05/26/2019)and HIPAA form(dated 05/31/17)were both signed. Roxine agrees to be contacted for future research and participate in the main study but does not wish to participate in the optional specimens for biobanking. Signed copies of both [phone] documents were provided to the patient. Christe was advised that the plan is during her next scheduled visit on 09/02/19, she will complete patient questionnaires, complete the assessment tests and have labs drawn prior to her chemo treatment: she verbalizes understanding and has no questions. I have scheduled Shamari to arrive at 7:45am that day so questionnaires and assessments can be completed prior to treatment and Jalena agrees with this plan. Diamone was also advised that an additional blood specimen will be collected during the last ten minutes of her taxane infusion and she verbalizes understanding. During chart review for eligibility it was noted she had a prior foot surgery for left foot pain with complaints of numbness. Per neurology notes dated 02/03/2018,  there was no neuropathy noted but she did ultimately have surgery for her symptoms. According to Sanvi, "Once I had my foot surgery, I never had any more symptoms." She denies any current neuropathy symptoms which will be reflected on the S1714 ONSTUDY form. Upon completion of signing [phone] consent, we proceeded to discuss DCP participation.    DCP-001 CONSENT: The DCP-001 consent form (protocol version date 09/23/18, [place] Active Date 04/07/19) was reviewed in full, including the voluntary nature of participation, the project purpose, risks and benefits, and who will see the provided information. Patient verbalizes understanding that this study is a one-time consent for collection of demographic variables and that no patient identifiers are being reported. We also reviewed the NIH DCP-001 HIPAA form (dated 05/24/14) including the data to be collected, why the information is being collected, who will see the information, and safety measures to keep information private. An opportunity for questions was provided but she has none at this time. Upon completion of review, floris neuhaus a desire to voluntarily participate in DCP-001. All consent and authorization forms were signed and dated by the patient and signed copies were provided.   DCP-001 WORKSHEET: After informed consent was obtained, the DCP-001 Worksheet was completed to collect ethnicity, language, literacy, education, employment, income and smoking history not available in the electronic medical record.     Upon completion of our visit, Kassia was escorted to the entrance and thanked for her time and willingness to participate in these studies. A business card with my direct contact information was provided and she is  encouraged to contact me for any questions, concerns or needs.   Dionne Bucy. Sharlett Iles, BSN, RN,  CIC 08/26/2019 3:48 PM

## 2019-08-26 NOTE — Progress Notes (Signed)
Pharmacist Chemotherapy Monitoring - Initial Assessment    Anticipated start date: 09/02/19  Regimen:  . Are orders appropriate based on the patient's diagnosis, regimen, and cycle? Yes . Does the plan date match the patient's scheduled date? Yes . Is the sequencing of drugs appropriate? Yes . Are the premedications appropriate for the patient's regimen? Yes . Prior Authorization for treatment is: Not Started o If applicable, is the correct biosimilar selected based on the patient's insurance? yes  Organ Function and Labs: Marland Kitchen Are dose adjustments needed based on the patient's renal function, hepatic function, or hematologic function? Yes . Are appropriate labs ordered prior to the start of patient's treatment? Yes . Other organ system assessment, if indicated: trastuzumab: Echo/ MUGA . The following baseline labs, if indicated, have been ordered: N/A  Dose Assessment: . Are the drug doses appropriate? Yes . Are the following correct: o Drug concentrations Yes o IV fluid compatible with drug Yes o Administration routes Yes o Timing of therapy Yes . If applicable, does the patient have documented access for treatment and/or plans for port-a-cath placement? yes . If applicable, have lifetime cumulative doses been properly documented and assessed? not applicable Lifetime Dose Tracking  No doses have been documented on this patient for the following tracked chemicals: Doxorubicin, Epirubicin, Idarubicin, Daunorubicin, Mitoxantrone, Bleomycin, Oxaliplatin, Carboplatin, Liposomal Doxorubicin  o   Toxicity Monitoring/Prevention: . The patient has the following take home antiemetics prescribed: Prochlorperazine . The patient has the following take home medications prescribed: N/A . Medication allergies and previous infusion related reactions, if applicable, have been reviewed and addressed. Yes . The patient's current medication list has been assessed for drug-drug interactions with their  chemotherapy regimen. no significant drug-drug interactions were identified on review.  Order Review: . Are the treatment plan orders signed? Yes . Is the patient scheduled to see a provider prior to their treatment? Yes  I verify that I have reviewed each item in the above checklist and answered each question accordingly.  Stephanie Brewer 08/26/2019 11:11 AM

## 2019-08-27 ENCOUNTER — Encounter: Payer: Self-pay | Admitting: Physical Therapy

## 2019-08-27 ENCOUNTER — Other Ambulatory Visit: Payer: Self-pay | Admitting: General Surgery

## 2019-08-27 ENCOUNTER — Ambulatory Visit: Payer: BC Managed Care – PPO | Attending: General Surgery | Admitting: Physical Therapy

## 2019-08-27 DIAGNOSIS — Z170 Estrogen receptor positive status [ER+]: Secondary | ICD-10-CM | POA: Diagnosis not present

## 2019-08-27 DIAGNOSIS — R293 Abnormal posture: Secondary | ICD-10-CM | POA: Insufficient documentation

## 2019-08-27 DIAGNOSIS — C50411 Malignant neoplasm of upper-outer quadrant of right female breast: Secondary | ICD-10-CM

## 2019-08-27 NOTE — Therapy (Signed)
[place], [place], [address] Phone: [phone]   Fax:  [phone]  Physical Therapy Evaluation  Patient Details  Name: Stephanie Brewer MRN: [id number] Date of Birth: 01-Mar-1966 Referring Provider (PT): Lindi Adie   Encounter Date: 08/27/2019  PT End of Session - 08/27/19 1646    Visit Number  1    Number of Visits  2    Date for PT Re-Evaluation  01/14/20    PT Start Time  1607    PT Stop Time  1645    PT Time Calculation (min)  38 min    Activity Tolerance  Patient tolerated treatment well    Behavior During Therapy  [facility] for tasks assessed/performed       Past Medical History:  Diagnosis Date  . DVT (deep venous thrombosis) (HCC)    left leg knee and ankle     Past Surgical History:  Procedure Laterality Date  . BREAST SURGERY     Augmentation 2003  . HERNIA REPAIR  2002    There were no vitals filed for this visit.   Subjective Assessment - 08/27/19 1608    Subjective  Pt reports she is here today to get baseline measurements before starting treatment for breast cancer.    Pertinent History  R breast cancer, grade 2 invasive ductal carcinoma, ER +, PR-, HER2+, beginning chemo next week 09/02/19, then will have surgery sometime in Sept 2021, hx of LLE DVT following foot surgery for bunion    Patient Stated Goals  reduce lymphedema risk and learn post op shoulder ROM HEP    Currently in Pain?  No/denies         [facility] PT Assessment - 08/27/19 0001      Assessment   Medical Diagnosis  R breast cancer    Referring Provider (PT)  Gudena    Onset Date/Surgical Date  08/13/19    Hand Dominance  Left    Prior Therapy  none      Precautions   Precautions  Other (comment)    Precaution Comments  active cancer      Restrictions   Weight Bearing Restrictions  No      Balance Screen   Has the patient fallen in the past 6 months  No    Has the patient had a decrease in activity level  because of a fear of falling?   No    Is the patient reluctant to leave their home because of a fear of falling?   No      Home Film/video editor residence    Living Arrangements  Spouse/significant other    Available Help at Discharge  Family    Type of [place]      Prior Function   Level of Independence  Independent    Vocation  Full time employment    Vocation Requirements  sells insurance    Leisure  pt has been unable to exercise due to recent foot surgery      Cognition   Overall Cognitive Status  Within Functional Limits for tasks assessed      Posture/Postural Control   Posture/Postural Control  Postural limitations    Postural Limitations  Rounded Shoulders;Forward head      ROM / Strength   AROM / PROM / Strength  AROM      AROM   AROM Assessment Site  Shoulder    Right/Left Shoulder  Right;Left  Right Shoulder Extension  58 Degrees    Right Shoulder Flexion  168 Degrees    Right Shoulder ABduction  170 Degrees    Right Shoulder Internal Rotation  65 Degrees    Right Shoulder External Rotation  84 Degrees    Left Shoulder Extension  55 Degrees    Left Shoulder Flexion  165 Degrees    Left Shoulder ABduction  167 Degrees    Left Shoulder Internal Rotation  65 Degrees    Left Shoulder External Rotation  84 Degrees        LYMPHEDEMA/ONCOLOGY QUESTIONNAIRE - 08/27/19 1616      Type   Cancer Type  right breast cancer      Lymphedema Assessments   Lymphedema Assessments  Upper extremities      Right Upper Extremity Lymphedema   15 cm Proximal to Olecranon Process  35.5 cm    Olecranon Process  27.6 cm    15 cm Proximal to Ulnar Styloid Process  26.4 cm    Just Proximal to Ulnar Styloid Process  18 cm    Across Hand at [employer]  19 cm    At [place] of 2nd Digit  6 cm      Left Upper Extremity Lymphedema   15 cm Proximal to Olecranon Process  35 cm    Olecranon Process  28.5 cm    15 cm Proximal to Ulnar Styloid Process   26.8 cm    Just Proximal to Ulnar Styloid Process  16.8 cm    Across Hand at [employer]  18.5 cm    At [place] of 2nd Digit  6.2 cm      L-DEX FLOWSHEETS - 08/27/19 1600      L-DEX LYMPHEDEMA SCREENING   Measurement Type  Unilateral    L-DEX MEASUREMENT EXTREMITY  Upper Extremity    POSITION   Standing    DOMINANT SIDE  Left    At Risk Side  Right    BASELINE SCORE (UNILATERAL)  1.4        The patient was assessed using the L-Dex machine today to produce a lymphedema index baseline score. The patient will be reassessed on a regular basis (typically every 3 months) to obtain new L-Dex scores. If the score is > 6.5 points away from his/her baseline score indicating onset of subclinical lymphedema, it will be recommended to wear a compression garment for 4 weeks, 12 hours per day and then be reassessed. If the score continues to be > 6.5 points from baseline at reassessment, we will initiate lymphedema treatment. Assessing in this manner has a 95% rate of preventing clinically significant lymphedema.       Objective measurements completed on examination: See above findings.         Patient was instructed today in a home exercise program today for post op shoulder range of motion. These included active assist shoulder flexion in sitting, scapular retraction, wall walking with shoulder abduction, and hands behind head external rotation.  She was encouraged to do these twice a day, holding 3 seconds and repeating 5 times when permitted by her physician.         PT Education - 08/27/19 1645    Education Details  post op shoulder exercises, signs and symptoms of lymphedema    Person(s) Educated  Patient    Methods  Explanation;Handout    Comprehension  Verbalized understanding          PT Long Term Goals -  08/27/19 1652      PT LONG TERM GOAL #1   Title  Pt will demonstrate she has regained full shoulder ROM and function post operatively compared to baselines.     Time  8    Period  Weeks    Status  New    Target Date  01/14/20      Breast Clinic Goals - 08/27/19 1652      Patient will be able to verbalize understanding of pertinent lymphedema risk reduction practices relevant to her diagnosis specifically related to skin care.   Status  Achieved      Patient will be able to return demonstrate and/or verbalize understanding of the post-op home exercise program related to regaining shoulder range of motion.   Status  Achieved      Patient will be able to verbalize understanding of the importance of attending the postoperative After Breast Cancer Class for further lymphedema risk reduction education and therapeutic exercise.   Status  Achieved            Plan - 08/27/19 1646    Clinical Impression Statement  Pt was diagnosed with R breast cancer, grade 2 invasive ductal carcinoma 08/13/19. It is HER2+, ER+, PR-. She is beginning radiation and should be having a lumpectomy and SLNB around the end of September 2021 and then will undergo radiation. Baseline ROM and SOZO measurements taken today. She will benefit from a post op PT reassessment to determine needs and in every 3 months for additonal L dex screening to detect subclinical lymphedema.    Personal Factors and Comorbidities  Fitness   recent foot surgery has been unable to exercise   Stability/Clinical Decision Making  Stable/Uncomplicated    Clinical Decision Making  Low    Rehab Potential  Excellent    PT Duration  --   Eval and 1 f/u post op then L-Dex screenings every 3 months   PT Treatment/Interventions  ADLs/Self Care Home Management;Therapeutic exercise;Patient/family education    PT Next Visit Plan  surgery not until late Sept, reassess ROM and circumferential measurements, make sure pt scheduled for another SOZO measurement 3 mo from surgery    PT Home Exercise Plan  post op breast ex    Consulted and Agree with Plan of Care  Patient       Patient will benefit from skilled  therapeutic intervention in order to improve the following deficits and impairments:  Postural dysfunction, Decreased knowledge of precautions  Visit Diagnosis: Abnormal posture  Malignant neoplasm of upper-outer quadrant of right breast in female, estrogen receptor positive ([place])   Patient will follow up at outpatient cancer rehab 3-4 weeks following surgery.  If the patient requires physical therapy at that time, a specific plan will be dictated and sent to the referring physician for approval. The patient was educated today on appropriate basic range of motion exercises to begin post operatively and the importance of attending the After Breast Cancer class following surgery.  Patient was educated today on lymphedema risk reduction practices as it pertains to recommendations that will benefit the patient immediately following surgery.  She verbalized good understanding.    Problem List Patient Active Problem List   Diagnosis Date Noted  . Malignant neoplasm of upper-outer quadrant of right breast in female, estrogen receptor positive ([place]) 08/20/2019  . Radicular leg pain 02/04/2018  . Numbness and tingling 02/04/2018  . Foot pain, left 02/04/2018  . Displacement of breast implant 12/24/2017  . H/O bilateral breast implants  12/24/2017  . GERD (gastroesophageal reflux disease) 12/06/2015  . Gluten intolerance 12/06/2015    Allyson Sabal [facility] 08/27/2019, 4:57 PM  [place], [place], [address] Phone: [phone]   Fax:  [phone]  Name: Stephanie Brewer MRN: [id number] Date of Birth: 1965-09-03  Manus Gunning, PT 08/27/19 4:57 PM

## 2019-08-28 ENCOUNTER — Encounter: Payer: Self-pay | Admitting: *Deleted

## 2019-08-28 ENCOUNTER — Other Ambulatory Visit (HOSPITAL_COMMUNITY)
Admission: RE | Admit: 2019-08-28 | Discharge: 2019-08-28 | Disposition: A | Discharge status: Home / Self Care | Payer: BC Managed Care – PPO | Source: Ambulatory Visit | Attending: General Surgery | Admitting: General Surgery

## 2019-08-28 DIAGNOSIS — Z01812 Encounter for preprocedural laboratory examination: Secondary | ICD-10-CM | POA: Diagnosis not present

## 2019-08-28 DIAGNOSIS — Z20822 Contact with and (suspected) exposure to covid-19: Secondary | ICD-10-CM | POA: Insufficient documentation

## 2019-08-28 MED ORDER — ENSURE PRE-SURGERY PO LIQD: 296.0000 mL | Freq: Once | ORAL | Status: DC

## 2019-08-28 NOTE — Progress Notes (Signed)

## 2019-08-29 LAB — SARS CORONAVIRUS 2 (TAT 6-24 HRS): SARS Coronavirus 2: NEGATIVE

## 2019-08-31 NOTE — Anesthesia Preprocedure Evaluation (Addendum)
Anesthesia Evaluation  Patient identified by MRN, date of birth, ID band Patient awake    Reviewed: Allergy & Precautions, NPO status , Patient's Chart, lab work & pertinent test results  Airway Mallampati: II  TM Distance: >3 FB Neck ROM: Full    Dental no notable dental hx. (+) Teeth Intact, Dental Advisory Given   Pulmonary neg pulmonary ROS,    Pulmonary exam normal breath sounds clear to auscultation       Cardiovascular Exercise Tolerance: Good + DVT  Normal cardiovascular exam Rhythm:Regular Rate:Normal     Neuro/Psych negative neurological ROS  negative psych ROS   GI/Hepatic negative GI ROS, Neg liver ROS, GERD  ,  Endo/Other  negative endocrine ROS  Renal/GU negative Renal ROS     Musculoskeletal negative musculoskeletal ROS (+)   Abdominal   Peds  Hematology negative hematology ROS (+)   Anesthesia Other Findings Hx of breast ca  Reproductive/Obstetrics                            Anesthesia Physical Anesthesia Plan  ASA: II  Anesthesia Plan: General   Post-op Pain Management:    Induction: Intravenous  PONV Risk Score and Plan: 3 and Midazolam, Treatment may vary due to age or medical condition, Dexamethasone and Ondansetron  Airway Management Planned: LMA  Additional Equipment: None  Intra-op Plan:   Post-operative Plan:   Informed Consent: I have reviewed the patients History and Physical, chart, labs and discussed the procedure including the risks, benefits and alternatives for the proposed anesthesia with the patient or authorized representative who has indicated his/her understanding and acceptance.     Dental advisory given  Plan Discussed with: CRNA  Anesthesia Plan Comments:        Anesthesia Quick Evaluation

## 2019-09-01 ENCOUNTER — Encounter (HOSPITAL_BASED_OUTPATIENT_CLINIC_OR_DEPARTMENT_OTHER): Payer: Self-pay | Admitting: General Surgery

## 2019-09-01 ENCOUNTER — Ambulatory Visit (HOSPITAL_COMMUNITY): Payer: BC Managed Care – PPO

## 2019-09-01 ENCOUNTER — Encounter (HOSPITAL_BASED_OUTPATIENT_CLINIC_OR_DEPARTMENT_OTHER)
Admission: RE | Disposition: A | Discharge status: Home / Self Care | Payer: Self-pay | Source: Home / Self Care | Attending: General Surgery

## 2019-09-01 ENCOUNTER — Ambulatory Visit (HOSPITAL_BASED_OUTPATIENT_CLINIC_OR_DEPARTMENT_OTHER): Payer: BC Managed Care – PPO | Admitting: Anesthesiology

## 2019-09-01 ENCOUNTER — Ambulatory Visit (HOSPITAL_BASED_OUTPATIENT_CLINIC_OR_DEPARTMENT_OTHER)
Admission: RE | Admit: 2019-09-01 | Discharge: 2019-09-01 | Disposition: A | Discharge status: Home / Self Care | Payer: BC Managed Care – PPO | Attending: General Surgery | Admitting: General Surgery

## 2019-09-01 ENCOUNTER — Other Ambulatory Visit: Payer: Self-pay

## 2019-09-01 ENCOUNTER — Encounter: Payer: Self-pay | Admitting: Hematology and Oncology

## 2019-09-01 DIAGNOSIS — Z87891 Personal history of nicotine dependence: Secondary | ICD-10-CM | POA: Insufficient documentation

## 2019-09-01 DIAGNOSIS — C50919 Malignant neoplasm of unspecified site of unspecified female breast: Secondary | ICD-10-CM | POA: Insufficient documentation

## 2019-09-01 DIAGNOSIS — F419 Anxiety disorder, unspecified: Secondary | ICD-10-CM | POA: Diagnosis not present

## 2019-09-01 DIAGNOSIS — Z170 Estrogen receptor positive status [ER+]: Secondary | ICD-10-CM | POA: Insufficient documentation

## 2019-09-01 DIAGNOSIS — Z86718 Personal history of other venous thrombosis and embolism: Secondary | ICD-10-CM | POA: Diagnosis not present

## 2019-09-01 DIAGNOSIS — Z888 Allergy status to other drugs, medicaments and biological substances status: Secondary | ICD-10-CM | POA: Diagnosis not present

## 2019-09-01 DIAGNOSIS — C50411 Malignant neoplasm of upper-outer quadrant of right female breast: Secondary | ICD-10-CM | POA: Diagnosis not present

## 2019-09-01 DIAGNOSIS — Z419 Encounter for procedure for purposes other than remedying health state, unspecified: Secondary | ICD-10-CM

## 2019-09-01 DIAGNOSIS — Z8049 Family history of malignant neoplasm of other genital organs: Secondary | ICD-10-CM | POA: Insufficient documentation

## 2019-09-01 DIAGNOSIS — Z881 Allergy status to other antibiotic agents status: Secondary | ICD-10-CM | POA: Insufficient documentation

## 2019-09-01 DIAGNOSIS — C50911 Malignant neoplasm of unspecified site of right female breast: Secondary | ICD-10-CM | POA: Diagnosis not present

## 2019-09-01 DIAGNOSIS — K219 Gastro-esophageal reflux disease without esophagitis: Secondary | ICD-10-CM | POA: Diagnosis not present

## 2019-09-01 DIAGNOSIS — Z452 Encounter for adjustment and management of vascular access device: Secondary | ICD-10-CM | POA: Diagnosis not present

## 2019-09-01 DIAGNOSIS — Z1501 Genetic susceptibility to malignant neoplasm of breast: Secondary | ICD-10-CM | POA: Diagnosis not present

## 2019-09-01 DIAGNOSIS — Z95828 Presence of other vascular implants and grafts: Secondary | ICD-10-CM

## 2019-09-01 DIAGNOSIS — Z7901 Long term (current) use of anticoagulants: Secondary | ICD-10-CM | POA: Diagnosis not present

## 2019-09-01 HISTORY — DX: Acute embolism and thrombosis of unspecified deep veins of unspecified lower extremity: I82.409

## 2019-09-01 HISTORY — PX: PORTACATH PLACEMENT: SHX2246

## 2019-09-01 SURGERY — INSERTION, TUNNELED CENTRAL VENOUS DEVICE, WITH PORT
Anesthesia: General | Site: Chest

## 2019-09-01 MED ORDER — ORAL CARE MOUTH RINSE: 15.0000 mL | Freq: Once | OROMUCOSAL | Status: DC

## 2019-09-01 MED ORDER — HEPARIN SOD (PORK) LOCK FLUSH 100 UNIT/ML IV SOLN
INTRAVENOUS | Status: AC
  Filled 2019-09-01: qty 5

## 2019-09-01 MED ORDER — FENTANYL CITRATE (PF) 100 MCG/2ML IJ SOLN: 25.0000 ug | INTRAMUSCULAR | Status: DC | PRN

## 2019-09-01 MED ORDER — KETOROLAC TROMETHAMINE 15 MG/ML IJ SOLN: 15.0000 mg | INTRAMUSCULAR | Status: DC

## 2019-09-01 MED ORDER — ACETAMINOPHEN 10 MG/ML IV SOLN: 1000.0000 mg | Freq: Once | INTRAVENOUS | Status: DC | PRN

## 2019-09-01 MED ORDER — KETOROLAC TROMETHAMINE 15 MG/ML IJ SOLN
INTRAMUSCULAR | Status: AC
  Filled 2019-09-01: qty 1

## 2019-09-01 MED ORDER — OXYCODONE HCL 5 MG PO TABS: 5.0000 mg | ORAL_TABLET | Freq: Once | ORAL | Status: DC | PRN

## 2019-09-01 MED ORDER — ONDANSETRON HCL 4 MG/2ML IJ SOLN
INTRAMUSCULAR | Status: AC
  Filled 2019-09-01: qty 2

## 2019-09-01 MED ORDER — FENTANYL CITRATE (PF) 100 MCG/2ML IJ SOLN
INTRAMUSCULAR | Status: DC | PRN
  Administered 2019-09-01 (×2): 50 ug via INTRAVENOUS

## 2019-09-01 MED ORDER — CHLORHEXIDINE GLUCONATE 0.12 % MT SOLN: 15.0000 mL | Freq: Once | OROMUCOSAL | Status: DC

## 2019-09-01 MED ORDER — GABAPENTIN 100 MG PO CAPS
ORAL_CAPSULE | ORAL | Status: AC
  Filled 2019-09-01: qty 1

## 2019-09-01 MED ORDER — MIDAZOLAM HCL 2 MG/2ML IJ SOLN: 1.0000 mg | INTRAMUSCULAR | Status: DC | PRN

## 2019-09-01 MED ORDER — PROPOFOL 10 MG/ML IV BOLUS
INTRAVENOUS | Status: DC | PRN
  Administered 2019-09-01: 150 mg via INTRAVENOUS

## 2019-09-01 MED ORDER — ACETAMINOPHEN 500 MG PO TABS
ORAL_TABLET | ORAL | Status: AC
  Filled 2019-09-01: qty 2

## 2019-09-01 MED ORDER — FENTANYL CITRATE (PF) 100 MCG/2ML IJ SOLN
INTRAMUSCULAR | Status: AC
  Filled 2019-09-01: qty 2

## 2019-09-01 MED ORDER — BUPIVACAINE HCL (PF) 0.25 % IJ SOLN
INTRAMUSCULAR | Status: AC
  Filled 2019-09-01: qty 30

## 2019-09-01 MED ORDER — GABAPENTIN 100 MG PO CAPS: 100.0000 mg | ORAL_CAPSULE | ORAL | Status: DC

## 2019-09-01 MED ORDER — CEFAZOLIN SODIUM-DEXTROSE 2-4 GM/100ML-% IV SOLN
2.0000 g | INTRAVENOUS | Status: AC
  Administered 2019-09-01: 2 g via INTRAVENOUS

## 2019-09-01 MED ORDER — LIDOCAINE 2% (20 MG/ML) 5 ML SYRINGE
INTRAMUSCULAR | Status: AC
  Filled 2019-09-01: qty 5

## 2019-09-01 MED ORDER — MIDAZOLAM HCL 2 MG/2ML IJ SOLN
INTRAMUSCULAR | Status: AC
  Filled 2019-09-01: qty 2

## 2019-09-01 MED ORDER — HEPARIN SOD (PORK) LOCK FLUSH 100 UNIT/ML IV SOLN
INTRAVENOUS | Status: DC | PRN
  Administered 2019-09-01: 500 [IU] via INTRAVENOUS

## 2019-09-01 MED ORDER — LACTATED RINGERS IV SOLN: INTRAVENOUS | Status: DC

## 2019-09-01 MED ORDER — BUPIVACAINE HCL (PF) 0.25 % IJ SOLN
INTRAMUSCULAR | Status: DC | PRN
  Administered 2019-09-01: 5 mL

## 2019-09-01 MED ORDER — MIDAZOLAM HCL 5 MG/5ML IJ SOLN
INTRAMUSCULAR | Status: DC | PRN
  Administered 2019-09-01: 2 mg via INTRAVENOUS

## 2019-09-01 MED ORDER — SCOPOLAMINE 1 MG/3DAYS TD PT72
MEDICATED_PATCH | TRANSDERMAL | Status: AC
  Filled 2019-09-01: qty 1

## 2019-09-01 MED ORDER — FENTANYL CITRATE (PF) 100 MCG/2ML IJ SOLN: 50.0000 ug | INTRAMUSCULAR | Status: DC | PRN

## 2019-09-01 MED ORDER — HEPARIN (PORCINE) IN NACL 1000-0.9 UT/500ML-% IV SOLN
INTRAVENOUS | Status: AC
  Filled 2019-09-01: qty 500

## 2019-09-01 MED ORDER — ONDANSETRON HCL 4 MG/2ML IJ SOLN: 4.0000 mg | Freq: Once | INTRAMUSCULAR | Status: DC | PRN

## 2019-09-01 MED ORDER — HEPARIN (PORCINE) IN NACL 2-0.9 UNITS/ML
INTRAMUSCULAR | Status: AC | PRN
  Administered 2019-09-01: 1 via INTRAVENOUS

## 2019-09-01 MED ORDER — LIDOCAINE HCL (CARDIAC) PF 100 MG/5ML IV SOSY
PREFILLED_SYRINGE | INTRAVENOUS | Status: DC | PRN
  Administered 2019-09-01: 80 mg via INTRAVENOUS

## 2019-09-01 MED ORDER — DEXAMETHASONE SODIUM PHOSPHATE 10 MG/ML IJ SOLN
INTRAMUSCULAR | Status: AC
  Filled 2019-09-01: qty 1

## 2019-09-01 MED ORDER — PROPOFOL 500 MG/50ML IV EMUL
INTRAVENOUS | Status: AC
  Filled 2019-09-01: qty 50

## 2019-09-01 MED ORDER — CEFAZOLIN SODIUM-DEXTROSE 2-4 GM/100ML-% IV SOLN
INTRAVENOUS | Status: AC
  Filled 2019-09-01: qty 100

## 2019-09-01 MED ORDER — ONDANSETRON HCL 4 MG/2ML IJ SOLN
INTRAMUSCULAR | Status: DC | PRN
  Administered 2019-09-01: 4 mg via INTRAVENOUS

## 2019-09-01 MED ORDER — ACETAMINOPHEN 500 MG PO TABS
1000.0000 mg | ORAL_TABLET | ORAL | Status: AC
  Administered 2019-09-01: 1000 mg via ORAL

## 2019-09-01 MED ORDER — OXYCODONE HCL 5 MG/5ML PO SOLN: 5.0000 mg | Freq: Once | ORAL | Status: DC | PRN

## 2019-09-01 MED ORDER — TRAMADOL HCL 50 MG PO TABS: 100.0000 mg | ORAL_TABLET | Freq: Four times a day (QID) | ORAL | 0 refills | Status: DC | PRN

## 2019-09-01 SURGICAL SUPPLY — 52 items
ADH SKN CLS APL DERMABOND .7 (GAUZE/BANDAGES/DRESSINGS) ×1
APL PRP STRL LF DISP 70% ISPRP (MISCELLANEOUS) ×2
APL SKNCLS STERI-STRIP NONHPOA (GAUZE/BANDAGES/DRESSINGS)
BAG DECANTER FOR FLEXI CONT (MISCELLANEOUS) ×2 IMPLANT
BENZOIN TINCTURE PRP APPL 2/3 (GAUZE/BANDAGES/DRESSINGS) ×1 IMPLANT
BLADE SURG 11 STRL SS (BLADE) ×2 IMPLANT
BLADE SURG 15 STRL LF DISP TIS (BLADE) ×1 IMPLANT
BLADE SURG 15 STRL SS (BLADE) ×2
CANISTER SUCT 1200ML W/VALVE (MISCELLANEOUS) IMPLANT
CHLORAPREP W/TINT 26 (MISCELLANEOUS) ×3 IMPLANT
COVER BACK TABLE 60X90IN (DRAPES) ×2 IMPLANT
COVER MAYO STAND STRL (DRAPES) ×2 IMPLANT
COVER PROBE 5X48 (MISCELLANEOUS)
COVER WAND RF STERILE (DRAPES) IMPLANT
DECANTER SPIKE VIAL GLASS SM (MISCELLANEOUS) IMPLANT
DERMABOND ADVANCED (GAUZE/BANDAGES/DRESSINGS) ×1
DERMABOND ADVANCED .7 DNX12 (GAUZE/BANDAGES/DRESSINGS) ×1 IMPLANT
DRAPE C-ARM 42X72 X-RAY (DRAPES) ×2 IMPLANT
DRAPE LAPAROSCOPIC ABDOMINAL (DRAPES) ×2 IMPLANT
DRAPE UTILITY XL STRL (DRAPES) ×2 IMPLANT
DRSG TEGADERM 4X4.75 (GAUZE/BANDAGES/DRESSINGS) ×2 IMPLANT
ELECT COATED BLADE 2.86 ST (ELECTRODE) ×2 IMPLANT
ELECT REM PT RETURN 9FT ADLT (ELECTROSURGICAL) ×2
ELECTRODE REM PT RTRN 9FT ADLT (ELECTROSURGICAL) ×1 IMPLANT
GAUZE SPONGE 4X4 12PLY STRL LF (GAUZE/BANDAGES/DRESSINGS) ×2 IMPLANT
GLOVE BIO SURGEON STRL SZ7 (GLOVE) ×2 IMPLANT
GLOVE BIOGEL PI IND STRL 7.5 (GLOVE) ×1 IMPLANT
GLOVE BIOGEL PI INDICATOR 7.5 (GLOVE) ×1
GOWN STRL REUS W/ TWL LRG LVL3 (GOWN DISPOSABLE) ×2 IMPLANT
GOWN STRL REUS W/TWL LRG LVL3 (GOWN DISPOSABLE) ×4
IV KIT MINILOC 20X1 SAFETY (NEEDLE) IMPLANT
KIT CVR 48X5XPRB PLUP LF (MISCELLANEOUS) IMPLANT
KIT PORT POWER 8FR ISP CVUE (Port) ×1 IMPLANT
NDL HYPO 25X1 1.5 SAFETY (NEEDLE) ×1 IMPLANT
NDL SAFETY ECLIPSE 18X1.5 (NEEDLE) IMPLANT
NEEDLE HYPO 18GX1.5 SHARP (NEEDLE)
NEEDLE HYPO 25X1 1.5 SAFETY (NEEDLE) ×2 IMPLANT
PENCIL SMOKE EVACUATOR (MISCELLANEOUS) ×2 IMPLANT
SET BASIN DAY SURGERY F.S. (CUSTOM PROCEDURE TRAY) ×2 IMPLANT
SLEEVE SCD COMPRESS KNEE MED (MISCELLANEOUS) ×2 IMPLANT
STRIP CLOSURE SKIN 1/2X4 (GAUZE/BANDAGES/DRESSINGS) ×2 IMPLANT
SUT MNCRL AB 4-0 PS2 18 (SUTURE) ×2 IMPLANT
SUT PROLENE 2 0 SH DA (SUTURE) ×2 IMPLANT
SUT SILK 2 0 TIES 17X18 (SUTURE)
SUT SILK 2-0 18XBRD TIE BLK (SUTURE) IMPLANT
SUT VIC AB 3-0 SH 27 (SUTURE) ×2
SUT VIC AB 3-0 SH 27X BRD (SUTURE) ×1 IMPLANT
SYR 5ML LUER SLIP (SYRINGE) ×2 IMPLANT
SYR CONTROL 10ML LL (SYRINGE) ×2 IMPLANT
TOWEL GREEN STERILE FF (TOWEL DISPOSABLE) ×2 IMPLANT
TUBE CONNECTING 20X1/4 (TUBING) IMPLANT
YANKAUER SUCT BULB TIP NO VENT (SUCTIONS) IMPLANT

## 2019-09-01 NOTE — Anesthesia Procedure Notes (Signed)
Procedure Name: LMA Insertion Date/Time: 09/01/2019 9:44 AM Performed by: Maryella Shivers, CRNA Pre-anesthesia Checklist: Patient identified, Emergency Drugs available, Suction available and Patient being monitored Patient Re-evaluated:Patient Re-evaluated prior to induction Oxygen Delivery Method: Circle system utilized Preoxygenation: Pre-oxygenation with 100% oxygen Induction Type: IV induction Ventilation: Mask ventilation without difficulty LMA: LMA inserted LMA Size: 4.0 Number of attempts: 1 Airway Equipment and Method: Bite block Placement Confirmation: positive ETCO2 Tube secured with: Tape Dental Injury: Teeth and Oropharynx as per pre-operative assessment

## 2019-09-01 NOTE — Op Note (Signed)
Preoperative diagnosis:clinical stage II right breast cancer Postoperative diagnosis: saa Procedure:Left IJport placement withUSguidance Surgeon: Dr Serita Grammes EBL: minimal Anesthesia: general  Complications none Drains none Specimens:none Sponge and needle count correct times two dispo to recovery stable  Indications:53 yof who has no prior breast history, has retropectoral implants from 2003 that she does not like due to size. she has recent foot surgery followed by dvt and is on xarelto. she had screening mm that shows c density breasts. there were two right breast masses. 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 there is another central right breast mass that measures 1.3x1.2x1.1 cm . there is one abnormal axillary node. biopsy of the node is positive. 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 We decided to pursue primary systemic therapy.   Procedure:After informed consent was obtained the patient was taken to the operating room. She was given antibiotics. SCDs were placed. She was placed under general anesthesia without complication. She was prepped and draped in the standard sterile surgical fashion. A surgical timeout was then performed.  I identified the internal jugular vein on theleft side with the ultrasound. I made a small nick in the skin. I accessed the internal jugular vein with the needle under ultrasound guidance. I passed the wire. the wire initially did not go to the SVC and I had some difficulty but eventually was able to get it in correct position. The wire was confirmed to be in position with fluoroscopy.The wire was in the vein by ultrasound as well.I then infiltrated Marcaine below the clavicle on theleftside. Imade anincision and developed a subcutaneous pocket for the port. I then tunneled between the port site as well as the insertion site. I brought the line through this.  I then placed the dilator under fluoroscopic guidance over the wire. I removedthe wire andthe dilator. I then placed the line into the sheath. The sheath was then removed. I pulled the line back to be in the distal vena cava but then this flipped out. I then replaced the wire and placed it deeper. She does not have a lot of room for distal tip to be in position. I pulled it back to where she was no longer having pacs. I then hooked this up to the port. This was placed in the pocket and sutured in place with 2-0 Prolene suture. I then closed this with 3-0 Vicryl and 4-0 Monocryl. Glue was placed.I accessed the port and left the needle in place for chemotherapy tomorrow.It withdrew blood and flushed easily. I packed it with heparin.I placed a dressing on it as well.

## 2019-09-01 NOTE — Anesthesia Postprocedure Evaluation (Signed)
Anesthesia Post Note  Patient: Stephanie Brewer  Procedure(s) Performed: INSERTION PORT-A-CATH WITH ULTRASOUND GUIDANCE (N/A Chest)     Patient location during evaluation: PACU Anesthesia Type: General Level of consciousness: awake and alert Pain management: pain level controlled Vital Signs Assessment: post-procedure vital signs reviewed and stable Respiratory status: spontaneous breathing, nonlabored ventilation, respiratory function stable and patient connected to nasal cannula oxygen Cardiovascular status: blood pressure returned to baseline and stable Postop Assessment: no apparent nausea or vomiting Anesthetic complications: no    Last Vitals:  Vitals:   09/01/19 1115 09/01/19 1144  BP: 125/80 130/82  Pulse: 64 64  Resp: 13 16  Temp:  36.4 C  SpO2: 100% 100%    Last Pain:  Vitals:   09/01/19 1144  TempSrc: Oral  PainSc: 2                  Barnet Glasgow

## 2019-09-01 NOTE — Interval H&P Note (Signed)
History and Physical Interval Note:  09/01/2019 9:26 AM  [name]  has presented today for surgery, with the diagnosis of BREAST CANCER.  The various methods of treatment have been discussed with the patient and family. After consideration of risks, benefits and other options for treatment, the patient has consented to  Procedure(s): INSERTION PORT-A-CATH WITH ULTRASOUND GUIDANCE (N/A) as a surgical intervention.  The patient's history has been reviewed, patient examined, no change in status, stable for surgery.  I have reviewed the patient's chart and labs.  Questions were answered to the patient's satisfaction.     Rolm Bookbinder

## 2019-09-01 NOTE — Discharge Instructions (Signed)
Per anesthesia, you may take your decadron at home after procedure Start Xarelto tomorrow morning.   YOU RECEIVED TYLENOL AT 8:40 AM AND YOU MAY REPEAT TYLENOL AY TIME AFT ER 2:40 PM IF NEEDED.           PORT-A-CATH: POST OP INSTRUCTIONS  Always review your discharge instruction sheet given to you by the facility where your surgery was performed.   1. A prescription for pain medication may be given to you upon discharge. Take your pain medication as prescribed, if needed. If narcotic pain medicine is not needed, then you make take acetaminophen (Tylenol) or ibuprofen (Advil) as needed.  2. Take your usually prescribed medications unless otherwise directed. 3. If you need a refill on your pain medication, please contact our office. All narcotic pain medicine now requires a paper prescription.  Phoned in and fax refills are no longer allowed by law.  Prescriptions will not be filled after 5 pm or on weekends.  4. You should follow a light diet for the remainder of the day after your procedure. 5. Most patients will experience some mild swelling and/or bruising in the area of the incision. It may take several days to resolve. 6. It is common to experience some constipation if taking pain medication after surgery. Increasing fluid intake and taking a stool softener (such as Colace) will usually help or prevent this problem from occurring. A mild laxative (Milk of Magnesia or Miralax) should be taken according to package directions if there are no bowel movements after 48 hours.  7. Unless discharge instructions indicate otherwise, you may remove your bandages 48 hours after surgery, and you may shower at that time. You may have steri-strips (small white skin tapes) in place directly over the incision.  These strips should be left on the skin for 7-10 days.  If your surgeon used Dermabond (skin glue) on the incision, you may shower in 24 hours.  The glue will flake off over the next 2-3 weeks.    8. If your port is left accessed at the end of surgery (needle left in port), the dressing cannot get wet and should only by changed by a healthcare professional. When the port is no longer accessed (when the needle has been removed), follow step 7.   9. ACTIVITIES:  Limit activity involving your arms for the next 72 hours. Do no strenuous exercise or activity for 1 week. You may drive when you are no longer taking prescription pain medication, you can comfortably wear a seatbelt, and you can maneuver your car. 10.You may need to see your doctor in the office for a follow-up appointment.  Please       check with your doctor.  11.When you receive a new Port-a-Cath, you will get a product guide and        ID card.  Please keep them in case you need them.  WHEN TO CALL YOUR DOCTOR [phone]): 1. Fever over 101.0 2. Chills 3. Continued bleeding from incision 4. Increased redness and tenderness at the site 5. Shortness of breath, difficulty breathing   The clinic staff is available to answer your questions during regular business hours. Please don't hesitate to call and ask to speak to one of the nurses or medical assistants for clinical concerns. If you have a medical emergency, go to the nearest emergency room or call 911.  A surgeon from [facility] is always on call at the hospital.     For further information, please  visit [email or web address]    Post Anesthesia Home Care Instructions  Activity: Get plenty of rest for the remainder of the day. A responsible individual must stay with you for 24 hours following the procedure.  For the next 24 hours, DO NOT: -Drive a car -Paediatric nurse -Drink alcoholic beverages -Take any medication unless instructed by your physician -Make any legal decisions or sign important papers.  Meals: Start with liquid foods such as gelatin or soup. Progress to regular foods as tolerated. Avoid greasy, spicy, heavy foods. If  nausea and/or vomiting occur, drink only clear liquids until the nausea and/or vomiting subsides. Call your physician if vomiting continues.  Special Instructions/Symptoms: Your throat may feel dry or sore from the anesthesia or the breathing tube placed in your throat during surgery. If this causes discomfort, gargle with warm salt water. The discomfort should disappear within 24 hours.  If you had a scopolamine patch placed behind your ear for the management of post- operative nausea and/or vomiting:  1. The medication in the patch is effective for 72 hours, after which it should be removed.  Wrap patch in a tissue and discard in the trash. Wash hands thoroughly with soap and water. 2. You may remove the patch earlier than 72 hours if you experience unpleasant side effects which may include dry mouth, dizziness or visual disturbances. 3. Avoid touching the patch. Wash your hands with soap and water after contact with the patch.         po

## 2019-09-01 NOTE — Progress Notes (Signed)
Patient Care Team: [name], DO as PCP - General (Family Medicine) Mauro Kaufmann, RN as Oncology Nurse Navigator Rockwell Germany, RN as Oncology Nurse Navigator Gwyndolyn Kaufman, RN as Registered Nurse  DIAGNOSIS:    ICD-10-CM   1. Malignant neoplasm of upper-outer quadrant of right breast in female, estrogen receptor positive ([place])  C50.411    Z17.0     SUMMARY OF ONCOLOGIC HISTORY: Oncology History  Malignant neoplasm of upper-outer quadrant of right breast in female, estrogen receptor positive ([place])  07/28/2019 Initial Diagnosis   Screening mammogram detected right breast mass 11:30 position subareolar 1.3 cm, indeterminate 5 mm mass: Biopsy fibroadenoma, right axillary lymph node present, biopsy of the lymph node and the mass revealed grade 2 IDC ER 10%, PR 0%, Ki-67 45%, HER-2 +3+ by [facility]   08/20/2019 Cancer Staging   Staging form: Breast, AJCC 8th Edition - Clinical stage from 08/20/2019: Stage IIA (cT1c, cN1, cM0, G2, ER+, PR-, HER2+) - Signed by Nicholas Lose, MD on 08/20/2019   09/02/2019 -  Chemotherapy   The patient had palonosetron (ALOXI) injection 0.25 mg, 0.25 mg, Intravenous,  Once, 0 of 6 cycles pegfilgrastim-jmdb (FULPHILA) injection 6 mg, 6 mg, Subcutaneous,  Once, 0 of 6 cycles CARBOplatin (PARAPLATIN) 700 mg in sodium chloride 0.9 % 250 mL chemo infusion, 700 mg (100 % of original dose 700 mg), Intravenous,  Once, 0 of 6 cycles Dose modification: 700 mg (original dose 700 mg, Cycle 1) DOCEtaxel (TAXOTERE) 150 mg in sodium chloride 0.9 % 250 mL chemo infusion, 75 mg/m2 = 150 mg, Intravenous,  Once, 0 of 6 cycles fosaprepitant (EMEND) 150 mg in sodium chloride 0.9 % 145 mL IVPB, 150 mg, Intravenous,  Once, 0 of 6 cycles pertuzumab (PERJETA) 420 mg in sodium chloride 0.9 % 250 mL chemo infusion, 420 mg (100 % of original dose 420 mg), Intravenous, Once, 0 of 6 cycles Dose modification: 420 mg (original dose 420 mg, Cycle 1, Reason: Provider  Judgment) trastuzumab-dkst (OGIVRI) 672 mg in sodium chloride 0.9 % 250 mL chemo infusion, 8 mg/kg = 672 mg, Intravenous,  Once, 0 of 6 cycles  for chemotherapy treatment.      CHIEF COMPLIANT: Cycle 1 TCH Perjeta   INTERVAL HISTORY: Stephanie Brewer is a 54 y.o. with above-mentioned history of right breast cancer. She is currently on neoadjuvant chemotherapy with [place]. Echo on 08/25/19 showed an ejection fraction of 60-65%. Breast MRI on 08/26/19 showed the known right breast mass measuring 3.8cm and one prominent right axillary lymph node with no other evidence of disease. She presents to the clinic today for cycle 1.   ALLERGIES:  is allergic to hyoscyamine; caffeine; doxycycline; and nexium [esomeprazole].  MEDICATIONS:  Current Outpatient Medications  Medication Sig Dispense Refill  . dexamethasone (DECADRON) 4 MG tablet Take 1 tablet (4 mg total) by mouth daily. Take 1 tablet day before chemo and 1 tablet day after chemo with food 12 tablet 0  . diazepam (VALIUM) 5 MG tablet Take 1 tablet (5 mg total) by mouth every 12 (twelve) hours as needed for anxiety. 30 tablet 0  . enoxaparin (LOVENOX) 80 MG/0.8ML injection Inject 0.8 mLs (80 mg total) into the skin every 12 (twelve) hours. 3.2 mL 0  . lidocaine-prilocaine (EMLA) cream Apply to affected area once 30 g 3  . LORazepam (ATIVAN) 0.5 MG tablet Take 1 tablet (0.5 mg total) by mouth at bedtime as needed for sleep. 30 tablet 0  . meclizine (ANTIVERT) 25  MG tablet Take 25 mg by mouth as needed for dizziness.    . Minocycline HCl 90 MG TB24 Take 1 tablet by mouth daily. Only takes prn for break outs    . ondansetron (ZOFRAN) 8 MG tablet Take 1 tablet (8 mg total) by mouth 2 (two) times daily as needed (Nausea or vomiting). Begin 4 days after chemotherapy. 30 tablet 1  . prochlorperazine (COMPAZINE) 10 MG tablet Take 1 tablet (10 mg total) by mouth every 6 (six) hours as needed (Nausea or vomiting). 30 tablet 1  . rivaroxaban (XARELTO) 20  MG TABS tablet Take 1 tablet (20 mg total) by mouth daily with supper. x5 months. Then stop. 30 tablet 4  . traMADol (ULTRAM) 50 MG tablet Take 2 tablets (100 mg total) by mouth every 6 (six) hours as needed. 5 tablet 0   No current facility-administered medications for this visit.   Facility-Administered Medications Ordered in Other Visits  Medication Dose Route Frequency Provider Last Rate Last Admin  . sodium chloride flush (NS) 0.9 % injection 10 mL  10 mL Intravenous PRN Nicholas Lose, MD   10 mL at 09/02/19 0839    PHYSICAL EXAMINATION: ECOG PERFORMANCE STATUS: 1 - Symptomatic but completely ambulatory  There were no vitals filed for this visit. There were no vitals filed for this visit.  LABORATORY DATA:  I have reviewed the data as listed CMP Latest Ref Rng & Units 07/20/2019 12/24/2017 01/15/2017  Glucose 65 - 99 mg/dL 109(H) 92 82  BUN 6 - 24 mg/dL 10 10 10   Creatinine 0.57 - 1.00 mg/dL 0.69 0.61 0.81  Sodium 134 - 144 mmol/L 140 145(H) 141  Potassium 3.5 - 5.2 mmol/L 3.9 4.2 4.2  Chloride 96 - 106 mmol/L 103 105 101  CO2 20 - 29 mmol/L 24 25 25   Calcium 8.7 - 10.2 mg/dL 9.3 9.3 9.3  Total Protein 6.0 - 8.5 g/dL 7.4 6.6 6.9  Total Bilirubin 0.0 - 1.2 mg/dL 0.3 0.4 0.2  Alkaline Phos 39 - 117 IU/L 130(H) 109 93  AST 0 - 40 IU/L 15 17 13   ALT 0 - 32 IU/L 10 13 8     Lab Results  Component Value Date   WBC 7.2 07/20/2019   HGB 13.5 07/20/2019   HCT 40.5 07/20/2019   MCV 84 07/20/2019   PLT 365 07/20/2019   NEUTROABS 2.6 12/24/2017    ASSESSMENT & PLAN:  Malignant neoplasm of upper-outer quadrant of right breast in female, estrogen receptor positive ([place]) 07/28/2019:Screening mammogram detected right breast mass 11:30 position subareolar 1.3 cm, indeterminate 5 mm mass: Biopsy fibroadenoma, right axillary lymph node present, biopsy of the lymph node and the mass revealed grade 2 IDC ER 10%, PR 0%, Ki-67 45%, HER-2 +3+ by IHC  T1 cN1 M0 stage IIa  Treatment plan: 1.  Neoadjuvant chemotherapy with TCH Perjeta 6 cycles followed by Herceptin Perjeta versus Kadcyla maintenance for 1 year 2. Followed by breast conserving surgery with sentinel lymph node study 3. Followed by adjuvant radiation therapy if patient had lumpectomy  Breast MRI: 3.8 cm tumor and 1 axillary lymph node.  ---------------------------------------------------------------------------------------------------------------------------------------------------- Current treatment: Cycle 1 day 1 TCH Perjeta Echocardiogram EF 60 to 65% 08/25/2019 Chemo class completed, chemo consent obtained Labs reviewed  Return to clinic in 1 week for toxicity check    No orders of the defined types were placed in this encounter.  The patient has a good understanding of the overall plan. she agrees with it. she will call with any  problems that may develop before the next visit here.  Total time spent: 30 mins including face to face time and time spent for planning, charting and coordination of care  Nicholas Lose, MD 09/02/2019  I, Cloyde Reams Dorshimer, am acting as scribe for Dr. Nicholas Lose.  I have reviewed the above documentation for accuracy and completeness, and I agree with the above.

## 2019-09-01 NOTE — Transfer of Care (Signed)
Immediate Anesthesia Transfer of Care Note  Patient: Stephanie Brewer  Procedure(s) Performed: INSERTION PORT-A-CATH WITH ULTRASOUND GUIDANCE (N/A Chest)  Patient Location: PACU  Anesthesia Type:General  Level of Consciousness: awake, alert  and oriented  Airway & Oxygen Therapy: Patient Spontanous Breathing and Patient connected to face mask oxygen  Post-op Assessment: Report given to RN and Post -op Vital signs reviewed and stable  Post vital signs: Reviewed and stable  Last Vitals:  Vitals Value Taken Time  BP 135/81 09/01/19 1044  Temp    Pulse 93 09/01/19 1044  Resp 14 09/01/19 1044  SpO2 100 % 09/01/19 1044  Vitals shown include unvalidated device data.  Last Pain:  Vitals:   09/01/19 0835  TempSrc:   PainSc: 0-No pain         Complications: No apparent anesthesia complications

## 2019-09-01 NOTE — Progress Notes (Signed)
PtwasapprovedforFulphila from5/28/21to5/27/22for up to $10,000.  The program reduces pt's copay responsibility to $0.  PtwasalsoapprovedforOgivrifrom5/28/21to5/27/22for up to $25,000.The program reduces pt's copay responsibility to $0.

## 2019-09-01 NOTE — H&P (Signed)
54 yof who has no prior breast history, has retropectoral implants from 2003 that she does not like due to size. she has recent foot surgery followed by dvt and is on xarelto. she had screening mm that shows c density breasts. there were two right breast masses. 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 there is another central right breast mass that measures 1.3x1.2x1.1 cm . there is one abnormal axillary node. biopsy of the node is positive. 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 she is here with her husband to discuss options. she has seen Dr Lindi Adie already   Past Surgical History Sabino Gasser, [place]; 08/26/2019 10:36 AM) Breast Augmentation  Bilateral. Breast Biopsy  Bilateral. Foot Surgery  Bilateral.  Diagnostic Studies History Sabino Gasser, CMA; 08/26/2019 10:36 AM) Colonoscopy  1-5 years ago Mammogram  within last year Pap Smear  1-5 years ago  Allergies Sabino Gasser, CMA; 08/26/2019 10:38 AM) NexIUM *ULCER DRUGS/ANTISPASMODICS/ANTICHOLINERGICS*  Doxycycline *DERMATOLOGICALS*  Hyoscyamine *CHEMICALS*  Allergies Reconciled   Medication History Sabino Gasser, CMA; 08/26/2019 10:39 AM) LORazepam (0.5MG  Tablet, Oral) Active. Dexamethasone (4MG  Tablet, Oral) Active. Enoxaparin Sodium (80MG /0.8ML Solution, Subcutaneous) Active. Lidocaine-Prilocaine (2.5-2.5% Cream, External) Active. Ondansetron HCl (8MG  Tablet, Oral) Active. Prochlorperazine Maleate (10MG  Tablet, Oral) Active. Valium (5MG  Tablet, Oral) Active. Xarelto (20MG  Tablet, Oral) Active. Medications Reconciled  Social History Sabino Gasser, CMA; 08/26/2019 10:36 AM) Alcohol use  Occasional alcohol use. Caffeine use  Coffee. No drug use  Tobacco use  Former smoker.  Family History Sabino Gasser, [place]; 08/26/2019 10:36 AM) Arthritis  Mother. Cervical Cancer  Family Members In General. Diabetes Mellitus  Brother, Family Members  In General. Heart Disease  Family Members In General. Hypertension  Father. Thyroid problems  Brother, Family Members In [place], Father.  Pregnancy / Birth History Sabino Gasser, CMA; 08/26/2019 10:36 AM) Age at menarche  9 years. Gravida  2 Irregular periods  Length (months) of breastfeeding  3-6 Maternal age  61-30 Para  2  Other Problems Sabino Gasser, [place]; 08/26/2019 10:36 AM) Anxiety Disorder  Breast Cancer  Other disease, cancer, significant illness  Umbilical Hernia Repair    Review of Systems Sabino Gasser CMA; 08/26/2019 10:36 AM) General Not Present- Appetite Loss, Chills, Fatigue, Fever, Night Sweats, Weight Gain and Weight Loss. Skin Not Present- Change in Wart/Mole, Dryness, Hives, Jaundice, New Lesions, Non-Healing Wounds, Rash and Ulcer. HEENT Present- Wears glasses/contact lenses. Not Present- Earache, Hearing Loss, Hoarseness, Nose Bleed, Oral Ulcers, Ringing in the Ears, Seasonal Allergies, Sinus Pain, Sore Throat, Visual Disturbances and Yellow Eyes. Respiratory Not Present- Bloody sputum, Chronic Cough, Difficulty Breathing, Snoring and Wheezing. Breast Present- Breast Mass. Not Present- Breast Pain, Nipple Discharge and Skin Changes. Cardiovascular Not Present- Chest Pain, Difficulty Breathing Lying Down, Leg Cramps, Palpitations, Rapid Heart Rate, Shortness of Breath and Swelling of Extremities. Gastrointestinal Not Present- Abdominal Pain, Bloating, Bloody Stool, Change in Bowel Habits, Chronic diarrhea, Constipation, Difficulty Swallowing, Excessive gas, Gets full quickly at meals, Hemorrhoids, Indigestion, Nausea, Rectal Pain and Vomiting. Female Genitourinary Not Present- Frequency, Nocturia, Painful Urination, Pelvic Pain and Urgency. Musculoskeletal Not Present- Back Pain, Joint Pain, Joint Stiffness, Muscle Pain, Muscle Weakness and Swelling of Extremities. Neurological Not Present- Decreased Memory, Fainting, Headaches, Numbness, Seizures,  Tingling, Tremor, Trouble walking and Weakness. Psychiatric Not Present- Anxiety, Bipolar, Change in Sleep Pattern, Depression, Fearful and Frequent crying. Endocrine Not Present- Cold Intolerance, Excessive Hunger, Hair Changes, Heat Intolerance, Hot flashes and New  Diabetes. Hematology Present- Blood Thinners. Not Present- Easy Bruising, Excessive bleeding, Gland problems, HIV and Persistent Infections.  Vitals Sabino Gasser CMA; 08/26/2019 10:41 AM) 08/26/2019 10:39 AM Weight: 185.4 lb Height: 64in Body Surface Area: 1.89 m Body Mass Index: 31.82 kg/m  Temp.: 98.56F (Tympanic)  Pulse: 87 (Regular)  BP: 124/72(Sitting, Left Arm, Standard) Physical Exam Rolm Bookbinder MD; 08/26/2019 12:24 PM) General Mental Status-Alert. Orientation-Oriented X3. Breast Nipples-No Discharge. Breast Lump-No Palpable Breast Mass. Lymphatic Head & Neck General Head & Neck Lymphatics: Bilateral - Description - Normal. Axillary General Axillary Region: Bilateral - Description - Normal. Note: no [place] adenopathy   Assessment & Plan Rolm Bookbinder MD; 08/26/2019 12:30 PM) BREAST CANCER METASTASIZED TO AXILLARY LYMPH NODE (C50.919) Port placement for primary chemotherapy we discussed staging and pathophysiology of breast cancer including all available treatment options. we discussed rationale for primary systemic therapy including less nodal surgery as well as guiding future therapy. I believe this will be better long term as well. we discussed port placement next week and leaving her accessed. she would like implants out also and I will have her see plastic surgery at some point as well. we discussed tad at time of surgery as well as lumpectomy/mastectomy. she would desire lumpectomy at this point.

## 2019-09-02 ENCOUNTER — Encounter: Payer: Self-pay | Admitting: *Deleted

## 2019-09-02 ENCOUNTER — Inpatient Hospital Stay: Payer: BC Managed Care – PPO

## 2019-09-02 ENCOUNTER — Inpatient Hospital Stay: Payer: BC Managed Care – PPO | Admitting: Hematology and Oncology

## 2019-09-02 ENCOUNTER — Inpatient Hospital Stay: Payer: BC Managed Care – PPO | Attending: Hematology and Oncology

## 2019-09-02 ENCOUNTER — Other Ambulatory Visit: Payer: BC Managed Care – PPO

## 2019-09-02 ENCOUNTER — Other Ambulatory Visit: Payer: Self-pay

## 2019-09-02 VITALS — BP 126/81 | HR 77 | Temp 99.2°F | Resp 17

## 2019-09-02 DIAGNOSIS — Z5112 Encounter for antineoplastic immunotherapy: Secondary | ICD-10-CM | POA: Insufficient documentation

## 2019-09-02 DIAGNOSIS — Z5111 Encounter for antineoplastic chemotherapy: Secondary | ICD-10-CM | POA: Insufficient documentation

## 2019-09-02 DIAGNOSIS — Z5189 Encounter for other specified aftercare: Secondary | ICD-10-CM | POA: Diagnosis not present

## 2019-09-02 DIAGNOSIS — Z170 Estrogen receptor positive status [ER+]: Secondary | ICD-10-CM | POA: Diagnosis not present

## 2019-09-02 DIAGNOSIS — C50411 Malignant neoplasm of upper-outer quadrant of right female breast: Secondary | ICD-10-CM | POA: Diagnosis not present

## 2019-09-02 LAB — CMP (CANCER CENTER ONLY)
ALT: 16 U/L (ref 0–44)
AST: 19 U/L (ref 15–41)
Albumin: 3.8 g/dL (ref 3.5–5.0)
Alkaline Phosphatase: 97 U/L (ref 38–126)
Anion gap: 11 (ref 5–15)
BUN: 10 mg/dL (ref 6–20)
CO2: 22 mmol/L (ref 22–32)
Calcium: 9.3 mg/dL (ref 8.9–10.3)
Chloride: 108 mmol/L (ref 98–111)
Creatinine: 0.73 mg/dL (ref 0.44–1.00)
GFR, Est AFR Am: >60 mL/min (ref >60)
GFR, Estimated: >60 mL/min (ref >60)
Glucose, Bld: 116 mg/dL — ABNORMAL HIGH (ref 70–99)
Potassium: 3.9 mmol/L (ref 3.5–5.1)
Sodium: 141 mmol/L (ref 135–145)
Total Bilirubin: 0.4 mg/dL (ref 0.3–1.2)
Total Protein: 7.4 g/dL (ref 6.5–8.1)

## 2019-09-02 LAB — CBC WITH DIFFERENTIAL (CANCER CENTER ONLY)
Abs Immature Granulocytes: 0.03 10*3/uL (ref 0.00–0.07)
Basophils Absolute: 0 10*3/uL (ref 0.0–0.1)
Basophils Relative: 0 %
Eosinophils Absolute: 0 10*3/uL (ref 0.0–0.5)
Eosinophils Relative: 0 %
HCT: 39.6 % (ref 36.0–46.0)
Hemoglobin: 12.9 g/dL (ref 12.0–15.0)
Immature Granulocytes: 0 %
Lymphocytes Relative: 12 %
Lymphs Abs: 1 10*3/uL (ref 0.7–4.0)
MCH: 28.2 pg (ref 26.0–34.0)
MCHC: 32.6 g/dL (ref 30.0–36.0)
MCV: 86.7 fL (ref 80.0–100.0)
Monocytes Absolute: 0.5 10*3/uL (ref 0.1–1.0)
Monocytes Relative: 6 %
Neutro Abs: 6.9 10*3/uL (ref 1.7–7.7)
Neutrophils Relative %: 82 %
Platelet Count: 297 10*3/uL (ref 150–400)
RBC: 4.57 MIL/uL (ref 3.87–5.11)
RDW: 13.7 % (ref 11.5–15.5)
WBC Count: 8.4 10*3/uL (ref 4.0–10.5)
nRBC: 0 % (ref 0.0–0.2)

## 2019-09-02 MED ORDER — PALONOSETRON HCL INJECTION 0.25 MG/5ML
INTRAVENOUS | Status: AC
  Filled 2019-09-02: qty 5

## 2019-09-02 MED ORDER — ACETAMINOPHEN 325 MG PO TABS
650.0000 mg | ORAL_TABLET | Freq: Once | ORAL | Status: AC
  Administered 2019-09-02: 650 mg via ORAL

## 2019-09-02 MED ORDER — SODIUM CHLORIDE 0.9% FLUSH
10.0000 mL | INTRAVENOUS | Status: DC | PRN
  Administered 2019-09-02: 10 mL via INTRAVENOUS
  Filled 2019-09-02: qty 10

## 2019-09-02 MED ORDER — SODIUM CHLORIDE 0.9 % IV SOLN
150.0000 mg | Freq: Once | INTRAVENOUS | Status: AC
  Administered 2019-09-02: 150 mg via INTRAVENOUS
  Filled 2019-09-02: qty 150

## 2019-09-02 MED ORDER — SODIUM CHLORIDE 0.9% FLUSH
10.0000 mL | INTRAVENOUS | Status: DC | PRN
  Administered 2019-09-02: 10 mL
  Filled 2019-09-02: qty 10

## 2019-09-02 MED ORDER — ACETAMINOPHEN 325 MG PO TABS
ORAL_TABLET | ORAL | Status: AC
  Filled 2019-09-02: qty 2

## 2019-09-02 MED ORDER — SODIUM CHLORIDE 0.9 % IV SOLN
700.0000 mg | Freq: Once | INTRAVENOUS | Status: AC
  Administered 2019-09-02: 700 mg via INTRAVENOUS
  Filled 2019-09-02: qty 70

## 2019-09-02 MED ORDER — DIPHENHYDRAMINE HCL 25 MG PO CAPS
50.0000 mg | ORAL_CAPSULE | Freq: Once | ORAL | Status: AC
  Administered 2019-09-02: 50 mg via ORAL

## 2019-09-02 MED ORDER — PALONOSETRON HCL INJECTION 0.25 MG/5ML
0.2500 mg | Freq: Once | INTRAVENOUS | Status: AC
  Administered 2019-09-02: 0.25 mg via INTRAVENOUS

## 2019-09-02 MED ORDER — TRASTUZUMAB-DKST CHEMO 150 MG IV SOLR
8.0000 mg/kg | Freq: Once | INTRAVENOUS | Status: AC
  Administered 2019-09-02: 672 mg via INTRAVENOUS
  Filled 2019-09-02: qty 32

## 2019-09-02 MED ORDER — SODIUM CHLORIDE 0.9 % IV SOLN
420.0000 mg | Freq: Once | INTRAVENOUS | Status: AC
  Administered 2019-09-02: 420 mg via INTRAVENOUS
  Filled 2019-09-02: qty 14

## 2019-09-02 MED ORDER — DIPHENHYDRAMINE HCL 25 MG PO CAPS
ORAL_CAPSULE | ORAL | Status: AC
  Filled 2019-09-02: qty 2

## 2019-09-02 MED ORDER — HEPARIN SOD (PORK) LOCK FLUSH 100 UNIT/ML IV SOLN
500.0000 [IU] | Freq: Once | INTRAVENOUS | Status: AC | PRN
  Administered 2019-09-02: 500 [IU]
  Filled 2019-09-02: qty 5

## 2019-09-02 MED ORDER — SODIUM CHLORIDE 0.9 % IV SOLN
Freq: Once | INTRAVENOUS | Status: AC
  Filled 2019-09-02: qty 250

## 2019-09-02 MED ORDER — SODIUM CHLORIDE 0.9 % IV SOLN
75.0000 mg/m2 | Freq: Once | INTRAVENOUS | Status: AC
  Administered 2019-09-02: 150 mg via INTRAVENOUS
  Filled 2019-09-02: qty 15

## 2019-09-02 MED ORDER — SODIUM CHLORIDE 0.9 % IV SOLN
10.0000 mg | Freq: Once | INTRAVENOUS | Status: AC
  Administered 2019-09-02: 10 mg via INTRAVENOUS
  Filled 2019-09-02: qty 10

## 2019-09-02 NOTE — Patient Instructions (Signed)
[facility] Discharge Instructions for Patients Receiving Chemotherapy  Today you received the following chemotherapy agents: trastuzumab, pertuzumab, taxotere, carboplatin.  To help prevent nausea and vomiting after your treatment, we encourage you to take your nausea medication as prescribed by your physician.    If you develop nausea and vomiting that is not controlled by your nausea medication, call the clinic.   BELOW ARE SYMPTOMS THAT SHOULD BE REPORTED IMMEDIATELY:  *FEVER GREATER THAN 100.5 F  *CHILLS WITH OR WITHOUT FEVER  NAUSEA AND VOMITING THAT IS NOT CONTROLLED WITH YOUR NAUSEA MEDICATION  *UNUSUAL SHORTNESS OF BREATH  *UNUSUAL BRUISING OR BLEEDING  TENDERNESS IN MOUTH AND THROAT WITH OR WITHOUT PRESENCE OF ULCERS  *URINARY PROBLEMS  *BOWEL PROBLEMS  UNUSUAL RASH Items with * indicate a potential emergency and should be followed up as soon as possible.  Feel free to call the clinic should you have any questions or concerns. The clinic phone number is [phone].  Please show the [facility] at check-in to the Emergency Department and triage nurse.  Trastuzumab injection for infusion What is this medicine? TRASTUZUMAB (tras TOO zoo mab) is a monoclonal antibody. It is used to treat breast cancer and stomach cancer. This medicine may be used for other purposes; ask your health care provider or pharmacist if you have questions. COMMON BRAND NAME(S): Herceptin, Galvin Proffer, Trazimera What should I tell my health care provider before I take this medicine? They need to know if you have any of these conditions:  heart disease  heart failure  lung or breathing disease, like asthma  an unusual or allergic reaction to trastuzumab, benzyl alcohol, or other medications, foods, dyes, or preservatives  pregnant or trying to get pregnant  breast-feeding How should I use this medicine? This drug is given as an infusion  into a vein. It is administered in a hospital or clinic by a specially trained health care professional. Talk to your pediatrician regarding the use of this medicine in children. This medicine is not approved for use in children. Overdosage: If you think you have taken too much of this medicine contact a poison control center or emergency room at once. NOTE: This medicine is only for you. Do not share this medicine with others. What if I miss a dose? It is important not to miss a dose. Call your doctor or health care professional if you are unable to keep an appointment. What may interact with this medicine? This medicine may interact with the following medications:  certain types of chemotherapy, such as daunorubicin, doxorubicin, epirubicin, and idarubicin This list may not describe all possible interactions. Give your health care provider a list of all the medicines, herbs, non-prescription drugs, or dietary supplements you use. Also tell them if you smoke, drink alcohol, or use illegal drugs. Some items may interact with your medicine. What should I watch for while using this medicine? Visit your doctor for checks on your progress. Report any side effects. Continue your course of treatment even though you feel ill unless your doctor tells you to stop. Call your doctor or health care professional for advice if you get a fever, chills or sore throat, or other symptoms of a cold or flu. Do not treat yourself. Try to avoid being around people who are sick. You may experience fever, chills and shaking during your first infusion. These effects are usually mild and can be treated with other medicines. Report any side effects during the infusion to  your health care professional. Fever and chills usually do not happen with later infusions. Do not become pregnant while taking this medicine or for 7 months after stopping it. Women should inform their doctor if they wish to become pregnant or think they might  be pregnant. Women of child-bearing potential will need to have a negative pregnancy test before starting this medicine. There is a potential for serious side effects to an unborn child. Talk to your health care professional or pharmacist for more information. Do not breast-feed an infant while taking this medicine or for 7 months after stopping it. Women must use effective birth control with this medicine. What side effects may I notice from receiving this medicine? Side effects that you should report to your doctor or health care professional as soon as possible:  allergic reactions like skin rash, itching or hives, swelling of the face, lips, or tongue  chest pain or palpitations  cough  dizziness  feeling faint or lightheaded, falls  fever  general ill feeling or flu-like symptoms  signs of worsening heart failure like breathing problems; swelling in your legs and feet  unusually weak or tired Side effects that usually do not require medical attention (report to your doctor or health care professional if they continue or are bothersome):  bone pain  changes in taste  diarrhea  joint pain  nausea/vomiting  weight loss This list may not describe all possible side effects. Call your doctor for medical advice about side effects. You may report side effects to FDA at 1-800-FDA-1088. Where should I keep my medicine? This drug is given in a hospital or clinic and will not be stored at home. NOTE: This sheet is a summary. It may not cover all possible information. If you have questions about this medicine, talk to your doctor, pharmacist, or health care provider.  2020 Elsevier/Gold Standard (2016-03-13 14:37:52)  Pertuzumab injection What is this medicine? PERTUZUMAB (per TOOZ ue mab) is a monoclonal antibody. It is used to treat breast cancer. This medicine may be used for other purposes; ask your health care provider or pharmacist if you have questions. COMMON BRAND  NAME(S): PERJETA What should I tell my health care provider before I take this medicine? They need to know if you have any of these conditions:  heart disease  heart failure  high blood pressure  history of irregular heart beat  recent or ongoing radiation therapy  an unusual or allergic reaction to pertuzumab, other medicines, foods, dyes, or preservatives  pregnant or trying to get pregnant  breast-feeding How should I use this medicine? This medicine is for infusion into a vein. It is given by a health care professional in a hospital or clinic setting. Talk to your pediatrician regarding the use of this medicine in children. Special care may be needed. Overdosage: If you think you have taken too much of this medicine contact a poison control center or emergency room at once. NOTE: This medicine is only for you. Do not share this medicine with others. What if I miss a dose? It is important not to miss your dose. Call your doctor or health care professional if you are unable to keep an appointment. What may interact with this medicine? Interactions are not expected. Give your health care provider a list of all the medicines, herbs, non-prescription drugs, or dietary supplements you use. Also tell them if you smoke, drink alcohol, or use illegal drugs. Some items may interact with your medicine. This list may not  describe all possible interactions. Give your health care provider a list of all the medicines, herbs, non-prescription drugs, or dietary supplements you use. Also tell them if you smoke, drink alcohol, or use illegal drugs. Some items may interact with your medicine. What should I watch for while using this medicine? Your condition will be monitored carefully while you are receiving this medicine. Report any side effects. Continue your course of treatment even though you feel ill unless your doctor tells you to stop. Do not become pregnant while taking this medicine or for 7  months after stopping it. Women should inform their doctor if they wish to become pregnant or think they might be pregnant. Women of child-bearing potential will need to have a negative pregnancy test before starting this medicine. There is a potential for serious side effects to an unborn child. Talk to your health care professional or pharmacist for more information. Do not breast-feed an infant while taking this medicine or for 7 months after stopping it. Women must use effective birth control with this medicine. Call your doctor or health care professional for advice if you get a fever, chills or sore throat, or other symptoms of a cold or flu. Do not treat yourself. Try to avoid being around people who are sick. You may experience fever, chills, and headache during the infusion. Report any side effects during the infusion to your health care professional. What side effects may I notice from receiving this medicine? Side effects that you should report to your doctor or health care professional as soon as possible:  breathing problems  chest pain or palpitations  dizziness  feeling faint or lightheaded  fever or chills  skin rash, itching or hives  sore throat  swelling of the face, lips, or tongue  swelling of the legs or ankles  unusually weak or tired Side effects that usually do not require medical attention (report to your doctor or health care professional if they continue or are bothersome):  diarrhea  hair loss  nausea, vomiting  tiredness This list may not describe all possible side effects. Call your doctor for medical advice about side effects. You may report side effects to FDA at 1-800-FDA-1088. Where should I keep my medicine? This drug is given in a hospital or clinic and will not be stored at home. NOTE: This sheet is a summary. It may not cover all possible information. If you have questions about this medicine, talk to your doctor, pharmacist, or health care  provider.  2020 Elsevier/Gold Standard (2015-04-21 12:08:50)  Docetaxel injection What is this medicine? DOCETAXEL (doe se TAX el) is a chemotherapy drug. It targets fast dividing cells, like cancer cells, and causes these cells to die. This medicine is used to treat many types of cancers like breast cancer, certain stomach cancers, head and neck cancer, lung cancer, and prostate cancer. This medicine may be used for other purposes; ask your health care provider or pharmacist if you have questions. COMMON BRAND NAME(S): Docefrez, Taxotere What should I tell my health care provider before I take this medicine? They need to know if you have any of these conditions:  infection (especially a virus infection such as chickenpox, cold sores, or herpes)  liver disease  low blood counts, like low white cell, platelet, or red cell counts  an unusual or allergic reaction to docetaxel, polysorbate 80, other chemotherapy agents, other medicines, foods, dyes, or preservatives  pregnant or trying to get pregnant  breast-feeding How should I use this  medicine? This drug is given as an infusion into a vein. It is administered in a hospital or clinic by a specially trained health care professional. Talk to your pediatrician regarding the use of this medicine in children. Special care may be needed. Overdosage: If you think you have taken too much of this medicine contact a poison control center or emergency room at once. NOTE: This medicine is only for you. Do not share this medicine with others. What if I miss a dose? It is important not to miss your dose. Call your doctor or health care professional if you are unable to keep an appointment. What may interact with this medicine?  aprepitant  certain antibiotics like erythromycin or clarithromycin  certain antivirals for HIV or hepatitis  certain medicines for fungal infections like fluconazole, itraconazole, ketoconazole, posaconazole, or  voriconazole  cimetidine  ciprofloxacin  conivaptan  cyclosporine  dronedarone  fluvoxamine  grapefruit juice  imatinib  verapamil This list may not describe all possible interactions. Give your health care provider a list of all the medicines, herbs, non-prescription drugs, or dietary supplements you use. Also tell them if you smoke, drink alcohol, or use illegal drugs. Some items may interact with your medicine. What should I watch for while using this medicine? Your condition will be monitored carefully while you are receiving this medicine. You will need important blood work done while you are taking this medicine. Call your doctor or health care professional for advice if you get a fever, chills or sore throat, or other symptoms of a cold or flu. Do not treat yourself. This drug decreases your body's ability to fight infections. Try to avoid being around people who are sick. Some products may contain alcohol. Ask your health care professional if this medicine contains alcohol. Be sure to tell all health care professionals you are taking this medicine. Certain medicines, like metronidazole and disulfiram, can cause an unpleasant reaction when taken with alcohol. The reaction includes flushing, headache, nausea, vomiting, sweating, and increased thirst. The reaction can last from 30 minutes to several hours. You may get drowsy or dizzy. Do not drive, use machinery, or do anything that needs mental alertness until you know how this medicine affects you. Do not stand or sit up quickly, especially if you are an older patient. This reduces the risk of dizzy or fainting spells. Alcohol may interfere with the effect of this medicine. Talk to your health care professional about your risk of cancer. You may be more at risk for certain types of cancer if you take this medicine. Do not become pregnant while taking this medicine or for 6 months after stopping it. Women should inform their doctor if  they wish to become pregnant or think they might be pregnant. There is a potential for serious side effects to an unborn child. Talk to your health care professional or pharmacist for more information. Do not breast-feed an infant while taking this medicine or for 1 week after stopping it. Males who get this medicine must use a condom during sex with females who can get pregnant. If you get a woman pregnant, the baby could have birth defects. The baby could die before they are born. You will need to continue wearing a condom for 3 months after stopping the medicine. Tell your health care provider right away if your partner becomes pregnant while you are taking this medicine. This may interfere with the ability to father a child. You should talk to your doctor or health  care professional if you are concerned about your fertility. What side effects may I notice from receiving this medicine? Side effects that you should report to your doctor or health care professional as soon as possible:  allergic reactions like skin rash, itching or hives, swelling of the face, lips, or tongue  blurred vision  breathing problems  changes in vision  low blood counts - This drug may decrease the number of white blood cells, red blood cells and platelets. You may be at increased risk for infections and bleeding.  nausea and vomiting  pain, redness or irritation at site where injected  pain, tingling, numbness in the hands or feet  redness, blistering, peeling, or loosening of the skin, including inside the mouth  signs of decreased platelets or bleeding - bruising, pinpoint red spots on the skin, black, tarry stools, nosebleeds  signs of decreased red blood cells - unusually weak or tired, fainting spells, lightheadedness  signs of infection - fever or chills, cough, sore throat, pain or difficulty passing urine  swelling of the ankle, feet, hands Side effects that usually do not require medical attention  (report to your doctor or health care professional if they continue or are bothersome):  constipation  diarrhea  fingernail or toenail changes  hair loss  loss of appetite  mouth sores  muscle pain This list may not describe all possible side effects. Call your doctor for medical advice about side effects. You may report side effects to FDA at 1-800-FDA-1088. Where should I keep my medicine? This drug is given in a hospital or clinic and will not be stored at home. NOTE: This sheet is a summary. It may not cover all possible information. If you have questions about this medicine, talk to your doctor, pharmacist, or health care provider.  2020 Elsevier/Gold Standard (2018-11-13 10:19:06)  Carboplatin injection What is this medicine? CARBOPLATIN (KAR boe pla tin) is a chemotherapy drug. It targets fast dividing cells, like cancer cells, and causes these cells to die. This medicine is used to treat ovarian cancer and many other cancers. This medicine may be used for other purposes; ask your health care provider or pharmacist if you have questions. COMMON BRAND NAME(S): Paraplatin What should I tell my health care provider before I take this medicine? They need to know if you have any of these conditions:  blood disorders  hearing problems  kidney disease  recent or ongoing radiation therapy  an unusual or allergic reaction to carboplatin, cisplatin, other chemotherapy, other medicines, foods, dyes, or preservatives  pregnant or trying to get pregnant  breast-feeding How should I use this medicine? This drug is usually given as an infusion into a vein. It is administered in a hospital or clinic by a specially trained health care professional. Talk to your pediatrician regarding the use of this medicine in children. Special care may be needed. Overdosage: If you think you have taken too much of this medicine contact a poison control center or emergency room at once. NOTE:  This medicine is only for you. Do not share this medicine with others. What if I miss a dose? It is important not to miss a dose. Call your doctor or health care professional if you are unable to keep an appointment. What may interact with this medicine?  medicines for seizures  medicines to increase blood counts like filgrastim, pegfilgrastim, sargramostim  some antibiotics like amikacin, gentamicin, neomycin, streptomycin, tobramycin  vaccines Talk to your doctor or health care professional before taking  any of these medicines:  acetaminophen  aspirin  ibuprofen  ketoprofen  naproxen This list may not describe all possible interactions. Give your health care provider a list of all the medicines, herbs, non-prescription drugs, or dietary supplements you use. Also tell them if you smoke, drink alcohol, or use illegal drugs. Some items may interact with your medicine. What should I watch for while using this medicine? Your condition will be monitored carefully while you are receiving this medicine. You will need important blood work done while you are taking this medicine. This drug may make you feel generally unwell. This is not uncommon, as chemotherapy can affect healthy cells as well as cancer cells. Report any side effects. Continue your course of treatment even though you feel ill unless your doctor tells you to stop. In some cases, you may be given additional medicines to help with side effects. Follow all directions for their use. Call your doctor or health care professional for advice if you get a fever, chills or sore throat, or other symptoms of a cold or flu. Do not treat yourself. This drug decreases your body's ability to fight infections. Try to avoid being around people who are sick. This medicine may increase your risk to bruise or bleed. Call your doctor or health care professional if you notice any unusual bleeding. Be careful brushing and flossing your teeth or using  a toothpick because you may get an infection or bleed more easily. If you have any dental work done, tell your dentist you are receiving this medicine. Avoid taking products that contain aspirin, acetaminophen, ibuprofen, naproxen, or ketoprofen unless instructed by your doctor. These medicines may hide a fever. Do not become pregnant while taking this medicine. Women should inform their doctor if they wish to become pregnant or think they might be pregnant. There is a potential for serious side effects to an unborn child. Talk to your health care professional or pharmacist for more information. Do not breast-feed an infant while taking this medicine. What side effects may I notice from receiving this medicine? Side effects that you should report to your doctor or health care professional as soon as possible:  allergic reactions like skin rash, itching or hives, swelling of the face, lips, or tongue  signs of infection - fever or chills, cough, sore throat, pain or difficulty passing urine  signs of decreased platelets or bleeding - bruising, pinpoint red spots on the skin, black, tarry stools, nosebleeds  signs of decreased red blood cells - unusually weak or tired, fainting spells, lightheadedness  breathing problems  changes in hearing  changes in vision  chest pain  high blood pressure  low blood counts - This drug may decrease the number of white blood cells, red blood cells and platelets. You may be at increased risk for infections and bleeding.  nausea and vomiting  pain, swelling, redness or irritation at the injection site  pain, tingling, numbness in the hands or feet  problems with balance, talking, walking  trouble passing urine or change in the amount of urine Side effects that usually do not require medical attention (report to your doctor or health care professional if they continue or are bothersome):  hair loss  loss of appetite  metallic taste in the mouth  or changes in taste This list may not describe all possible side effects. Call your doctor for medical advice about side effects. You may report side effects to FDA at 1-800-FDA-1088. Where should I keep my medicine?  This drug is given in a hospital or clinic and will not be stored at home. NOTE: This sheet is a summary. It may not cover all possible information. If you have questions about this medicine, talk to your doctor, pharmacist, or health care provider.  2020 Elsevier/Gold Standard (2007-06-24 14:38:05)

## 2019-09-02 NOTE — Research (Signed)
S1714 STUDYING HOW CANCER TREATMENT AFFECTS THE NERVES IN YOUR HANDS AND FEET   09/02/2019 0745   Stephanie Brewer is accompanied by her husband for the assessment and labs today.  S1714 PROs and REGISTRATION: Upon arrival, Stephanie Brewer is provided the PROs for the S1714 study to complete prior to registration. Once PROs were completed, Stephanie Brewer was enrolled in S1714, PID# [id number].  NEUROPEN ASSESSMENT: Following registration, the "Timed Get Up and Go" and neuropen assessment were completed in exam room 34 with her husband present. Stephanie Brewer denies the use of any topical agents, complementary or alternative therapies and confirms her current medication list is accurate.   LABS: Following the neuro assessment, Stephanie Brewer had mandatory labs collected per [id number] protocol. Stephanie Brewer tolerated the procedure well without complaint.  TAXANE BLOOD SPECIMEN: Per the [id number] protocol, a mandatory blood specimen was collected via peripheral stick at 1605 by Stephanie Ponds, LPN. The taxane infusion ended at 1607. Lab specimens were transported by this nurse to the lab directly and delivered to [facility] for processing.  Stephanie Brewer tolerated all above procedures without complaint. She was thanked again for her time and participation in the S1714 study and she is encouraged to call with any questions: she verbalizes understanding.  Dionne Bucy. Sharlett Iles, BSN, RN, CIC 09/02/2019 4:58 PM

## 2019-09-02 NOTE — Assessment & Plan Note (Signed)
07/28/2019:Screening mammogram detected right breast mass 11:30 position subareolar 1.3 cm, indeterminate 5 mm mass: Biopsy fibroadenoma, right axillary lymph node present, biopsy of the lymph node and the mass revealed grade 2 IDC ER 10%, PR 0%, Ki-67 45%, HER-2 +3+ by IHC  T1 cN1 M0 stage IIa  Treatment plan: 1. Neoadjuvant chemotherapy with TCH Perjeta 6 cycles followed by Herceptin Perjeta versus Kadcyla maintenance for 1 year 2. Followed by breast conserving surgery with sentinel lymph node study 3. Followed by adjuvant radiation therapy if patient had lumpectomy ---------------------------------------------------------------------------------------------------------------------------------------------------- Current treatment: Cycle 1 day 1 TCH Perjeta Echocardiogram EF 60 to 65% 08/25/2019 Chemo class completed, chemo consent obtained Labs reviewed  Return to clinic in 1 week for toxicity check

## 2019-09-03 ENCOUNTER — Telehealth: Payer: Self-pay | Admitting: *Deleted

## 2019-09-03 ENCOUNTER — Encounter: Payer: Self-pay | Admitting: Licensed Clinical Social Worker

## 2019-09-03 NOTE — Progress Notes (Signed)
[place] Work  Clinical Social Work reached out to new patient for assessment of psychosocial needs.  Clinical Social Worker contacted patient by phone  to offer support and assess for needs.    Patient reports that she is doing well so far. She is focusing on remaining as positive as possible and is relying on her strong support network of family (husband, 2 kids), friends, and coworkers. She had her first chemo treatment yesterday and was able to go to work today with just some fatigue.   Patient expressed no needs from SW at this time. CSW provided direct contact information as well as information about support services programming for patient to access as desired.   Stephanie Brewer, [place], [place] Worker [facility]

## 2019-09-04 ENCOUNTER — Other Ambulatory Visit: Payer: Self-pay

## 2019-09-04 ENCOUNTER — Inpatient Hospital Stay: Payer: BC Managed Care – PPO

## 2019-09-04 DIAGNOSIS — Z5112 Encounter for antineoplastic immunotherapy: Secondary | ICD-10-CM | POA: Diagnosis not present

## 2019-09-04 DIAGNOSIS — Z170 Estrogen receptor positive status [ER+]: Secondary | ICD-10-CM | POA: Diagnosis not present

## 2019-09-04 DIAGNOSIS — C50411 Malignant neoplasm of upper-outer quadrant of right female breast: Secondary | ICD-10-CM | POA: Diagnosis not present

## 2019-09-04 DIAGNOSIS — Z5111 Encounter for antineoplastic chemotherapy: Secondary | ICD-10-CM | POA: Diagnosis not present

## 2019-09-04 DIAGNOSIS — Z5189 Encounter for other specified aftercare: Secondary | ICD-10-CM | POA: Diagnosis not present

## 2019-09-04 MED ORDER — PEGFILGRASTIM-JMDB 6 MG/0.6ML ~~LOC~~ SOSY
6.0000 mg | PREFILLED_SYRINGE | Freq: Once | SUBCUTANEOUS | Status: AC
  Administered 2019-09-04: 6 mg via SUBCUTANEOUS

## 2019-09-04 MED ORDER — PEGFILGRASTIM-JMDB 6 MG/0.6ML ~~LOC~~ SOSY
PREFILLED_SYRINGE | SUBCUTANEOUS | Status: AC
  Filled 2019-09-04: qty 0.6

## 2019-09-04 NOTE — Patient Instructions (Signed)

## 2019-09-07 ENCOUNTER — Telehealth: Payer: Self-pay | Admitting: *Deleted

## 2019-09-07 NOTE — Telephone Encounter (Signed)
Received after hours message from pt over the weekend regarding blood in her urine. RN returned call to pt and pt denies symptoms of hematuria at this time.  Pt states she is currently on Xalalto and occasionally experiences hematuria.  Pt states she increased her oral fluid intake and symptoms have resolved.  Pt denies fever or s/s of URI including burring with urination.  Pt currently has f/u with MD this week and will call the office if she starts to experience increase in symptoms or a fever.

## 2019-09-08 DIAGNOSIS — Z30432 Encounter for removal of intrauterine contraceptive device: Secondary | ICD-10-CM | POA: Diagnosis not present

## 2019-09-08 DIAGNOSIS — Z01419 Encounter for gynecological examination (general) (routine) without abnormal findings: Secondary | ICD-10-CM | POA: Diagnosis not present

## 2019-09-08 DIAGNOSIS — C773 Secondary and unspecified malignant neoplasm of axilla and upper limb lymph nodes: Secondary | ICD-10-CM | POA: Diagnosis not present

## 2019-09-08 DIAGNOSIS — C50911 Malignant neoplasm of unspecified site of right female breast: Secondary | ICD-10-CM | POA: Diagnosis not present

## 2019-09-08 DIAGNOSIS — Z6830 Body mass index (BMI) 30.0-30.9, adult: Secondary | ICD-10-CM | POA: Diagnosis not present

## 2019-09-08 NOTE — Progress Notes (Signed)
Patient Care Team: [name], DO as PCP - General (Family Medicine) Mauro Kaufmann, RN as Oncology Nurse Navigator Rockwell Germany, RN as Oncology Nurse Navigator Gwyndolyn Kaufman, RN as Registered Nurse  DIAGNOSIS:    ICD-10-CM   1. Malignant neoplasm of upper-outer quadrant of right breast in female, estrogen receptor positive ([place])  C50.411    Z17.0     SUMMARY OF ONCOLOGIC HISTORY: Oncology History  Malignant neoplasm of upper-outer quadrant of right breast in female, estrogen receptor positive ([place])  07/28/2019 Initial Diagnosis   Screening mammogram detected right breast mass 11:30 position subareolar 1.3 cm, indeterminate 5 mm mass: Biopsy fibroadenoma, right axillary lymph node present, biopsy of the lymph node and the mass revealed grade 2 IDC ER 10%, PR 0%, Ki-67 45%, HER-2 +3+ by [facility]   08/20/2019 Cancer Staging   Staging form: Breast, AJCC 8th Edition - Clinical stage from 08/20/2019: Stage IIA (cT1c, cN1, cM0, G2, ER+, PR-, HER2+) - Signed by Nicholas Lose, MD on 08/20/2019   09/02/2019 -  Chemotherapy   The patient had palonosetron (ALOXI) injection 0.25 mg, 0.25 mg, Intravenous,  Once, 1 of 6 cycles Administration: 0.25 mg (09/02/2019) pegfilgrastim-jmdb (FULPHILA) injection 6 mg, 6 mg, Subcutaneous,  Once, 1 of 6 cycles Administration: 6 mg (09/04/2019) CARBOplatin (PARAPLATIN) 700 mg in sodium chloride 0.9 % 250 mL chemo infusion, 700 mg (100 % of original dose 700 mg), Intravenous,  Once, 1 of 6 cycles Dose modification: 700 mg (original dose 700 mg, Cycle 1) Administration: 700 mg (09/02/2019) DOCEtaxel (TAXOTERE) 150 mg in sodium chloride 0.9 % 250 mL chemo infusion, 75 mg/m2 = 150 mg, Intravenous,  Once, 1 of 6 cycles Administration: 150 mg (09/02/2019) fosaprepitant (EMEND) 150 mg in sodium chloride 0.9 % 145 mL IVPB, 150 mg, Intravenous,  Once, 1 of 6 cycles Administration: 150 mg (09/02/2019) pertuzumab (PERJETA) 420 mg in sodium chloride 0.9 % 250 mL chemo  infusion, 420 mg (100 % of original dose 420 mg), Intravenous, Once, 1 of 6 cycles Dose modification: 420 mg (original dose 420 mg, Cycle 1, Reason: Provider Judgment) Administration: 420 mg (09/02/2019) trastuzumab-dkst (OGIVRI) 672 mg in sodium chloride 0.9 % 250 mL chemo infusion, 8 mg/kg = 672 mg, Intravenous,  Once, 1 of 6 cycles Administration: 672 mg (09/02/2019)  for chemotherapy treatment.      CHIEF COMPLIANT: Cycle 1 Day 8 TCH Perjeta   INTERVAL HISTORY: Stephanie Brewer is a 54 y.o. with above-mentioned history of right breast cancer. She is currently on neoadjuvant chemotherapy with [place]. She presents to the clinic today for a toxicity check following cycle 1.   She tolerated chemotherapy fairly well.  On fourth the day she had severe fatigue that lasted for 2 to 3 days.  Diarrhea was intermittent and she took Imodium which caused her a brief fear of constipation but she now has loose stools irregularly.  Denies any nausea or vomiting.  She had diffuse bone pain in spite of taking Claritin.  She did not take Tylenol because she was worried about side effects.  Because of Xarelto she had some urinary bleeding which subsided after couple of days.  ALLERGIES:  is allergic to hyoscyamine; caffeine; doxycycline; and nexium [esomeprazole].  MEDICATIONS:  Current Outpatient Medications  Medication Sig Dispense Refill  . dexamethasone (DECADRON) 4 MG tablet Take 1 tablet (4 mg total) by mouth daily. Take 1 tablet day before chemo and 1 tablet day after chemo with food 12 tablet 0  .  diazepam (VALIUM) 5 MG tablet Take 1 tablet (5 mg total) by mouth every 12 (twelve) hours as needed for anxiety. 30 tablet 0  . enoxaparin (LOVENOX) 80 MG/0.8ML injection Inject 0.8 mLs (80 mg total) into the skin every 12 (twelve) hours. 3.2 mL 0  . lidocaine-prilocaine (EMLA) cream Apply to affected area once 30 g 3  . LORazepam (ATIVAN) 0.5 MG tablet Take 1 tablet (0.5 mg total) by mouth at bedtime as  needed for sleep. 30 tablet 0  . meclizine (ANTIVERT) 25 MG tablet Take 25 mg by mouth as needed for dizziness.    . Minocycline HCl 90 MG TB24 Take 1 tablet by mouth daily. Only takes prn for break outs    . ondansetron (ZOFRAN) 8 MG tablet Take 1 tablet (8 mg total) by mouth 2 (two) times daily as needed (Nausea or vomiting). Begin 4 days after chemotherapy. 30 tablet 1  . prochlorperazine (COMPAZINE) 10 MG tablet Take 1 tablet (10 mg total) by mouth every 6 (six) hours as needed (Nausea or vomiting). 30 tablet 1  . rivaroxaban (XARELTO) 20 MG TABS tablet Take 1 tablet (20 mg total) by mouth daily with supper. x5 months. Then stop. 30 tablet 4  . traMADol (ULTRAM) 50 MG tablet Take 2 tablets (100 mg total) by mouth every 6 (six) hours as needed. 5 tablet 0   No current facility-administered medications for this visit.   Facility-Administered Medications Ordered in Other Visits  Medication Dose Route Frequency Provider Last Rate Last Admin  . sodium chloride flush (NS) 0.9 % injection 10 mL  10 mL Intravenous PRN Nicholas Lose, MD   10 mL at 09/02/19 0839    PHYSICAL EXAMINATION: ECOG PERFORMANCE STATUS: 1 - Symptomatic but completely ambulatory  Vitals:   09/09/19 0944  BP: 120/84  Pulse: (!) 119  Resp: 18  Temp: 98.9 F (37.2 C)  SpO2: 98%   Filed Weights   09/09/19 0944  Weight: 178 lb 4.8 oz (80.9 kg)    LABORATORY DATA:  I have reviewed the data as listed CMP Latest Ref Rng & Units 09/02/2019 07/20/2019 12/24/2017  Glucose 70 - 99 mg/dL 116(H) 109(H) 92  BUN 6 - 20 mg/dL 10 10 10   Creatinine 0.44 - 1.00 mg/dL 0.73 0.69 0.61  Sodium 135 - 145 mmol/L 141 140 145(H)  Potassium 3.5 - 5.1 mmol/L 3.9 3.9 4.2  Chloride 98 - 111 mmol/L 108 103 105  CO2 22 - 32 mmol/L 22 24 25   Calcium 8.9 - 10.3 mg/dL 9.3 9.3 9.3  Total Protein 6.5 - 8.1 g/dL 7.4 7.4 6.6  Total Bilirubin 0.3 - 1.2 mg/dL 0.4 0.3 0.4  Alkaline Phos 38 - 126 U/L 97 130(H) 109  AST 15 - 41 U/L 19 15 17   ALT 0 -  44 U/L 16 10 13     Lab Results  Component Value Date   WBC 6.1 09/09/2019   HGB 13.0 09/09/2019   HCT 38.3 09/09/2019   MCV 84.4 09/09/2019   PLT 195 09/09/2019   NEUTROABS PENDING 09/09/2019    ASSESSMENT & PLAN:  Malignant neoplasm of upper-outer quadrant of right breast in female, estrogen receptor positive ([place]) 07/28/2019:Screening mammogram detected right breast mass 11:30 position subareolar 1.3 cm, indeterminate 5 mm mass: Biopsy fibroadenoma, right axillary lymph node present, biopsy of the lymph node and the mass revealed grade 2 IDC ER 10%, PR 0%, Ki-67 45%, HER-2 +3+ by IHC T1 cN1 M0 stage IIa  Treatment plan: 1. Neoadjuvant chemotherapy with  [place] Perjeta 6 cycles followed by HerceptinPerjeta versus Kadcylamaintenance for 1 year 2. Followed by breast conserving surgery with sentinel lymph node study 3. Followed by adjuvant radiation therapy if patient had lumpectomy  Breast MRI: 3.8 cm tumor and 1 axillary lymph node.  ---------------------------------------------------------------------------------------------------------------------------------------------------- Current treatment: Cycle 1 day 8 TCH Perjeta Echocardiogram EF 60 to 65% 08/25/2019  Chemo toxicities:   1.  Intermittent diarrhea 2.  Severe fatigue day 4 today 7 3.  Bone pain in the pelvis due to Neulasta  Return to clinic in 2 weeks for cycle 2     No orders of the defined types were placed in this encounter.  The patient has a good understanding of the overall plan. she agrees with it. she will call with any problems that may develop before the next visit here.  Total time spent: 30 mins including face to face time and time spent for planning, charting and coordination of care  Nicholas Lose, MD 09/09/2019  I, Cloyde Reams Dorshimer, am acting as scribe for Dr. Nicholas Lose.  I have reviewed the above documentation for accuracy and completeness, and I agree with the above.

## 2019-09-09 ENCOUNTER — Inpatient Hospital Stay: Payer: BC Managed Care – PPO

## 2019-09-09 ENCOUNTER — Inpatient Hospital Stay (HOSPITAL_BASED_OUTPATIENT_CLINIC_OR_DEPARTMENT_OTHER): Payer: BC Managed Care – PPO | Admitting: Hematology and Oncology

## 2019-09-09 ENCOUNTER — Other Ambulatory Visit: Payer: Self-pay

## 2019-09-09 ENCOUNTER — Encounter: Payer: Self-pay | Admitting: *Deleted

## 2019-09-09 DIAGNOSIS — Z170 Estrogen receptor positive status [ER+]: Secondary | ICD-10-CM

## 2019-09-09 DIAGNOSIS — C50411 Malignant neoplasm of upper-outer quadrant of right female breast: Secondary | ICD-10-CM | POA: Diagnosis not present

## 2019-09-09 DIAGNOSIS — Z5112 Encounter for antineoplastic immunotherapy: Secondary | ICD-10-CM | POA: Diagnosis not present

## 2019-09-09 DIAGNOSIS — Z5189 Encounter for other specified aftercare: Secondary | ICD-10-CM | POA: Diagnosis not present

## 2019-09-09 DIAGNOSIS — Z5111 Encounter for antineoplastic chemotherapy: Secondary | ICD-10-CM | POA: Diagnosis not present

## 2019-09-09 DIAGNOSIS — Z95828 Presence of other vascular implants and grafts: Secondary | ICD-10-CM

## 2019-09-09 LAB — CMP (CANCER CENTER ONLY)
ALT: 41 U/L (ref 0–44)
AST: 25 U/L (ref 15–41)
Albumin: 3.5 g/dL (ref 3.5–5.0)
Alkaline Phosphatase: 105 U/L (ref 38–126)
Anion gap: 15 (ref 5–15)
BUN: 7 mg/dL (ref 6–20)
CO2: 23 mmol/L (ref 22–32)
Calcium: 9.3 mg/dL (ref 8.9–10.3)
Chloride: 97 mmol/L — ABNORMAL LOW (ref 98–111)
Creatinine: 0.77 mg/dL (ref 0.44–1.00)
GFR, Est AFR Am: >60 mL/min (ref >60)
GFR, Estimated: >60 mL/min (ref >60)
Glucose, Bld: 121 mg/dL — ABNORMAL HIGH (ref 70–99)
Potassium: 3.9 mmol/L (ref 3.5–5.1)
Sodium: 135 mmol/L (ref 135–145)
Total Bilirubin: 0.5 mg/dL (ref 0.3–1.2)
Total Protein: 7.4 g/dL (ref 6.5–8.1)

## 2019-09-09 LAB — CBC WITH DIFFERENTIAL (CANCER CENTER ONLY)
Abs Immature Granulocytes: 0.72 10*3/uL — ABNORMAL HIGH (ref 0.00–0.07)
Basophils Absolute: 0 10*3/uL (ref 0.0–0.1)
Basophils Relative: 1 %
Eosinophils Absolute: 0 10*3/uL (ref 0.0–0.5)
Eosinophils Relative: 1 %
HCT: 38.3 % (ref 36.0–46.0)
Hemoglobin: 13 g/dL (ref 12.0–15.0)
Immature Granulocytes: 12 %
Lymphocytes Relative: 19 %
Lymphs Abs: 1.2 10*3/uL (ref 0.7–4.0)
MCH: 28.6 pg (ref 26.0–34.0)
MCHC: 33.9 g/dL (ref 30.0–36.0)
MCV: 84.4 fL (ref 80.0–100.0)
Monocytes Absolute: 1.7 10*3/uL — ABNORMAL HIGH (ref 0.1–1.0)
Monocytes Relative: 27 %
Neutro Abs: 2.5 10*3/uL (ref 1.7–7.7)
Neutrophils Relative %: 40 %
Platelet Count: 195 10*3/uL (ref 150–400)
RBC: 4.54 MIL/uL (ref 3.87–5.11)
RDW: 13.2 % (ref 11.5–15.5)
WBC Count: 6.1 10*3/uL (ref 4.0–10.5)
nRBC: 0 % (ref 0.0–0.2)

## 2019-09-09 MED ORDER — HEPARIN SOD (PORK) LOCK FLUSH 100 UNIT/ML IV SOLN
500.0000 [IU] | Freq: Once | INTRAVENOUS | Status: AC
  Administered 2019-09-09: 500 [IU]
  Filled 2019-09-09: qty 5

## 2019-09-09 MED ORDER — SODIUM CHLORIDE 0.9% FLUSH
10.0000 mL | Freq: Once | INTRAVENOUS | Status: AC
  Administered 2019-09-09: 10 mL
  Filled 2019-09-09: qty 10

## 2019-09-09 NOTE — Assessment & Plan Note (Signed)
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 Neoadjuvant chemotherapy with TCH Perjeta 6 cycles followed by HerceptinPerjeta versus Kadcylamaintenance for 1 year 2. Followed by breast conserving surgery with sentinel lymph node study 3. Followed by adjuvant radiation therapy if patient had lumpectomy  Breast MRI: 3.8 cm tumor and 1 axillary lymph node.  ---------------------------------------------------------------------------------------------------------------------------------------------------- Current treatment: Cycle 1 day 8 TCH Perjeta Echocardiogram EF 60 to 65% 08/25/2019  Chemo toxicities:    Return to clinic in 2 weeks for cycle 2

## 2019-09-23 ENCOUNTER — Other Ambulatory Visit: Payer: Self-pay

## 2019-09-23 ENCOUNTER — Inpatient Hospital Stay (HOSPITAL_BASED_OUTPATIENT_CLINIC_OR_DEPARTMENT_OTHER): Payer: BC Managed Care – PPO | Admitting: Adult Health

## 2019-09-23 ENCOUNTER — Inpatient Hospital Stay: Payer: BC Managed Care – PPO

## 2019-09-23 ENCOUNTER — Encounter: Payer: Self-pay | Admitting: Adult Health

## 2019-09-23 VITALS — BP 139/83 | HR 84 | Temp 98.1°F | Resp 18

## 2019-09-23 VITALS — BP 144/86 | HR 89 | Temp 98.3°F | Resp 18 | Ht 64.0 in | Wt 184.2 lb

## 2019-09-23 DIAGNOSIS — Z5111 Encounter for antineoplastic chemotherapy: Secondary | ICD-10-CM | POA: Diagnosis not present

## 2019-09-23 DIAGNOSIS — Z170 Estrogen receptor positive status [ER+]: Secondary | ICD-10-CM

## 2019-09-23 DIAGNOSIS — C50411 Malignant neoplasm of upper-outer quadrant of right female breast: Secondary | ICD-10-CM

## 2019-09-23 DIAGNOSIS — Z5112 Encounter for antineoplastic immunotherapy: Secondary | ICD-10-CM | POA: Diagnosis not present

## 2019-09-23 DIAGNOSIS — Z95828 Presence of other vascular implants and grafts: Secondary | ICD-10-CM

## 2019-09-23 DIAGNOSIS — Z5189 Encounter for other specified aftercare: Secondary | ICD-10-CM | POA: Diagnosis not present

## 2019-09-23 LAB — CBC WITH DIFFERENTIAL (CANCER CENTER ONLY)
Abs Immature Granulocytes: 0.17 10*3/uL — ABNORMAL HIGH (ref 0.00–0.07)
Basophils Absolute: 0.1 10*3/uL (ref 0.0–0.1)
Basophils Relative: 1 %
Eosinophils Absolute: 0 10*3/uL (ref 0.0–0.5)
Eosinophils Relative: 0 %
HCT: 33.5 % — ABNORMAL LOW (ref 36.0–46.0)
Hemoglobin: 10.9 g/dL — ABNORMAL LOW (ref 12.0–15.0)
Immature Granulocytes: 2 %
Lymphocytes Relative: 18 %
Lymphs Abs: 1.9 10*3/uL (ref 0.7–4.0)
MCH: 28.2 pg (ref 26.0–34.0)
MCHC: 32.5 g/dL (ref 30.0–36.0)
MCV: 86.6 fL (ref 80.0–100.0)
Monocytes Absolute: 0.9 10*3/uL (ref 0.1–1.0)
Monocytes Relative: 8 %
Neutro Abs: 7.6 10*3/uL (ref 1.7–7.7)
Neutrophils Relative %: 71 %
Platelet Count: 400 10*3/uL (ref 150–400)
RBC: 3.87 MIL/uL (ref 3.87–5.11)
RDW: 15.1 % (ref 11.5–15.5)
WBC Count: 10.6 10*3/uL — ABNORMAL HIGH (ref 4.0–10.5)
nRBC: 0 % (ref 0.0–0.2)

## 2019-09-23 LAB — CMP (CANCER CENTER ONLY)
ALT: 19 U/L (ref 0–44)
AST: 16 U/L (ref 15–41)
Albumin: 3.5 g/dL (ref 3.5–5.0)
Alkaline Phosphatase: 102 U/L (ref 38–126)
Anion gap: 9 (ref 5–15)
BUN: 13 mg/dL (ref 6–20)
CO2: 24 mmol/L (ref 22–32)
Calcium: 8.8 mg/dL — ABNORMAL LOW (ref 8.9–10.3)
Chloride: 110 mmol/L (ref 98–111)
Creatinine: 0.79 mg/dL (ref 0.44–1.00)
GFR, Est AFR Am: >60 mL/min (ref >60)
GFR, Estimated: >60 mL/min (ref >60)
Glucose, Bld: 100 mg/dL — ABNORMAL HIGH (ref 70–99)
Potassium: 3.7 mmol/L (ref 3.5–5.1)
Sodium: 143 mmol/L (ref 135–145)
Total Bilirubin: 0.3 mg/dL (ref 0.3–1.2)
Total Protein: 6.7 g/dL (ref 6.5–8.1)

## 2019-09-23 MED ORDER — PALONOSETRON HCL INJECTION 0.25 MG/5ML
INTRAVENOUS | Status: AC
  Filled 2019-09-23: qty 5

## 2019-09-23 MED ORDER — ACETAMINOPHEN 325 MG PO TABS
ORAL_TABLET | ORAL | Status: AC
  Filled 2019-09-23: qty 2

## 2019-09-23 MED ORDER — SODIUM CHLORIDE 0.9 % IV SOLN
420.0000 mg | Freq: Once | INTRAVENOUS | Status: AC
  Administered 2019-09-23: 420 mg via INTRAVENOUS
  Filled 2019-09-23: qty 14

## 2019-09-23 MED ORDER — DIPHENHYDRAMINE HCL 25 MG PO CAPS
ORAL_CAPSULE | ORAL | Status: AC
  Filled 2019-09-23: qty 2

## 2019-09-23 MED ORDER — ACETAMINOPHEN 325 MG PO TABS
650.0000 mg | ORAL_TABLET | Freq: Once | ORAL | Status: AC
  Administered 2019-09-23: 650 mg via ORAL

## 2019-09-23 MED ORDER — TRASTUZUMAB-DKST CHEMO 150 MG IV SOLR
6.0000 mg/kg | Freq: Once | INTRAVENOUS | Status: AC
  Administered 2019-09-23: 504 mg via INTRAVENOUS
  Filled 2019-09-23: qty 24

## 2019-09-23 MED ORDER — SODIUM CHLORIDE 0.9 % IV SOLN
700.0000 mg | Freq: Once | INTRAVENOUS | Status: AC
  Administered 2019-09-23: 700 mg via INTRAVENOUS
  Filled 2019-09-23: qty 70

## 2019-09-23 MED ORDER — SODIUM CHLORIDE 0.9% FLUSH
10.0000 mL | Freq: Once | INTRAVENOUS | Status: AC
  Administered 2019-09-23: 10 mL
  Filled 2019-09-23: qty 10

## 2019-09-23 MED ORDER — SODIUM CHLORIDE 0.9 % IV SOLN
10.0000 mg | Freq: Once | INTRAVENOUS | Status: AC
  Administered 2019-09-23: 10 mg via INTRAVENOUS
  Filled 2019-09-23: qty 10

## 2019-09-23 MED ORDER — DIPHENHYDRAMINE HCL 25 MG PO CAPS
50.0000 mg | ORAL_CAPSULE | Freq: Once | ORAL | Status: AC
  Administered 2019-09-23: 50 mg via ORAL

## 2019-09-23 MED ORDER — SODIUM CHLORIDE 0.9% FLUSH
10.0000 mL | INTRAVENOUS | Status: DC | PRN
  Administered 2019-09-23: 10 mL
  Filled 2019-09-23: qty 10

## 2019-09-23 MED ORDER — SODIUM CHLORIDE 0.9 % IV SOLN
Freq: Once | INTRAVENOUS | Status: AC
  Filled 2019-09-23: qty 250

## 2019-09-23 MED ORDER — SODIUM CHLORIDE 0.9 % IV SOLN
150.0000 mg | Freq: Once | INTRAVENOUS | Status: AC
  Administered 2019-09-23: 150 mg via INTRAVENOUS
  Filled 2019-09-23: qty 150

## 2019-09-23 MED ORDER — SODIUM CHLORIDE 0.9 % IV SOLN
75.0000 mg/m2 | Freq: Once | INTRAVENOUS | Status: AC
  Administered 2019-09-23: 150 mg via INTRAVENOUS
  Filled 2019-09-23: qty 15

## 2019-09-23 MED ORDER — PALONOSETRON HCL INJECTION 0.25 MG/5ML
0.2500 mg | Freq: Once | INTRAVENOUS | Status: AC
  Administered 2019-09-23: 0.25 mg via INTRAVENOUS

## 2019-09-23 MED ORDER — HEPARIN SOD (PORK) LOCK FLUSH 100 UNIT/ML IV SOLN
500.0000 [IU] | Freq: Once | INTRAVENOUS | Status: AC | PRN
  Administered 2019-09-23: 500 [IU]
  Filled 2019-09-23: qty 5

## 2019-09-23 NOTE — Assessment & Plan Note (Addendum)
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 Neoadjuvant chemotherapy with TCH Perjeta 6 cycles followed by HerceptinPerjeta versus Kadcylamaintenance for 1 year 2. Followed by breast conserving surgery with sentinel lymph node study 3. Followed by adjuvant radiation therapy if patient had lumpectomy  Breast MRI: 3.8 cm tumor and 1 axillary lymph node.  ---------------------------------------------------------------------------------------------------------------------------------------------------- Current treatment: Cycle 2 day 1 TCH Perjeta Echocardiogram EF 60 to 65% 08/25/2019  Chemo toxicities:   She is tolerating chemotherapy well.   No peripheral neuropathy Back pain with Neulasta, Claritin daily along with tylenol was recommended Labs reviewed and stable.  She will proceed with treatment today with her second cycle of TCHP.

## 2019-09-23 NOTE — Progress Notes (Signed)
[facility] Follow up:    Stephanie Norlander, DO [place] [address]   DIAGNOSIS: Cancer Staging Malignant neoplasm of upper-outer quadrant of right breast in female, estrogen receptor positive ([place]) Staging form: Breast, AJCC 8th Edition - Clinical stage from 08/20/2019: Stage IIA (cT1c, cN1, cM0, G2, ER+, PR-, HER2+) - Signed by Nicholas Lose, MD on 08/20/2019   SUMMARY OF ONCOLOGIC HISTORY: Oncology History  Malignant neoplasm of upper-outer quadrant of right breast in female, estrogen receptor positive ([place])  07/28/2019 Initial Diagnosis   Screening mammogram detected right breast mass 11:30 position subareolar 1.3 cm, indeterminate 5 mm mass: Biopsy fibroadenoma, right axillary lymph node present, biopsy of the lymph node and the mass revealed grade 2 IDC ER 10%, PR 0%, Ki-67 45%, HER-2 +3+ by [facility]   08/20/2019 Cancer Staging   Staging form: Breast, AJCC 8th Edition - Clinical stage from 08/20/2019: Stage IIA (cT1c, cN1, cM0, G2, ER+, PR-, HER2+) - Signed by Nicholas Lose, MD on 08/20/2019   09/02/2019 -  Chemotherapy   The patient had dexamethasone (DECADRON) 4 MG tablet, 4 mg (100 % of original dose 4 mg), Oral, Daily, 1 of 1 cycle, Start date: 08/20/2019, End date: -- Dose modification: 4 mg (original dose 4 mg, Cycle 0) palonosetron (ALOXI) injection 0.25 mg, 0.25 mg, Intravenous,  Once, 2 of 6 cycles Administration: 0.25 mg (09/02/2019) pegfilgrastim-jmdb (FULPHILA) injection 6 mg, 6 mg, Subcutaneous,  Once, 2 of 6 cycles Administration: 6 mg (09/04/2019) CARBOplatin (PARAPLATIN) 700 mg in sodium chloride 0.9 % 250 mL chemo infusion, 700 mg (100 % of original dose 700 mg), Intravenous,  Once, 2 of 6 cycles Dose modification: 700 mg (original dose 700 mg, Cycle 1) Administration: 700 mg (09/02/2019) DOCEtaxel (TAXOTERE) 150 mg in sodium chloride 0.9 % 250 mL chemo infusion, 75 mg/m2 = 150 mg, Intravenous,  Once, 2 of 6 cycles Administration: 150 mg  (09/02/2019) fosaprepitant (EMEND) 150 mg in sodium chloride 0.9 % 145 mL IVPB, 150 mg, Intravenous,  Once, 2 of 6 cycles Administration: 150 mg (09/02/2019) pertuzumab (PERJETA) 420 mg in sodium chloride 0.9 % 250 mL chemo infusion, 420 mg (100 % of original dose 420 mg), Intravenous, Once, 2 of 6 cycles Dose modification: 420 mg (original dose 420 mg, Cycle 1, Reason: Provider Judgment) Administration: 420 mg (09/02/2019) trastuzumab-dkst (OGIVRI) 672 mg in sodium chloride 0.9 % 250 mL chemo infusion, 8 mg/kg = 672 mg, Intravenous,  Once, 2 of 6 cycles Administration: 672 mg (09/02/2019)  for chemotherapy treatment.      CURRENT THERAPY: TCHP cycle 2  INTERVAL HISTORY: Stephanie Brewer 54 y.o. female returns for evaluation prior to receiving her second cycle of TCHP.  She is tolerating her treatment well.  She notes her biggest issue was lower back pain related to the neulasta and she plans on taking claritin and tylenol daily to manage this.  She also is requesting a script for a wig so that she can get some money back from [employer] [facility], where she purchased it.  She has no peripheral neuropathy.  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    Patient Active Problem List   Diagnosis Date Noted  . Port-A-Cath in place 09/09/2019  . Malignant neoplasm of upper-outer quadrant of right breast in female, estrogen receptor positive ([place]) 08/20/2019  . Radicular leg pain 02/04/2018  . Numbness and tingling 02/04/2018  . Foot pain, left 02/04/2018  . Displacement of breast  implant 12/24/2017  . H/O bilateral breast implants 12/24/2017  . GERD (gastroesophageal reflux disease) 12/06/2015  . Gluten intolerance 12/06/2015    is allergic to hyoscyamine, caffeine, doxycycline, and nexium [esomeprazole].  MEDICAL HISTORY: Past Medical History:  Diagnosis Date  . DVT (deep venous thrombosis) ([facility])    left leg knee and ankle     SURGICAL HISTORY: Past Surgical History:   Procedure Laterality Date  . BREAST SURGERY     Augmentation 2003  . HERNIA REPAIR  2002  . PORTACATH PLACEMENT N/A 09/01/2019   Procedure: INSERTION PORT-A-CATH WITH ULTRASOUND GUIDANCE;  Surgeon: Rolm Bookbinder, MD;  Location: [facility];  Service: General;  Laterality: N/A;    SOCIAL HISTORY: Social History   Socioeconomic History  . Marital status: Married    Spouse name: Not on file  . Number of children: 2  . Years of education: some college  . Highest education level: Not on file  Occupational History  . 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 Smoking status: Never Smoker  . Smokeless tobacco: Never Used  Vaping Use  . Vaping Use: Never used  Substance and Sexual Activity  . Alcohol use: Yes    Comment: rare-twice monthly or less  . Drug use: No  . Sexual activity: Yes    Birth control/protection: I.U.D.  Other Topics Concern  . Not on file  Social History Narrative   Lives with spouse   Caffeine use: no caffeine    Left handed    Social Determinants of Health   Financial Resource Strain:   . Difficulty of Paying Living Expenses:   Food Insecurity:   . Worried About Charity fundraiser in the Last Year:   . Arboriculturist in the Last Year:   Transportation Needs:   . Film/video editor (Medical):   Marland Kitchen Lack of Transportation (Non-Medical):   Physical Activity:   . Days of Exercise per Week:   . Minutes of Exercise per Session:   Stress:   . Feeling of Stress :   Social Connections:   . Frequency of Communication with Friends and Family:   . Frequency of Social Gatherings with Friends and Family:   . Attends Religious Services:   . Active Member of Clubs or Organizations:   . Attends Archivist Meetings:   Marland Kitchen Marital Status:   Intimate Partner Violence:   . Fear of Current or Ex-Partner:   . Emotionally Abused:   Marland Kitchen Physically Abused:   . Sexually Abused:     FAMILY HISTORY: Family History  Problem Relation Age  of Onset  . Hypertension Father     Review of Systems  Constitutional: Negative for appetite change, chills, fatigue and fever.  HENT:   Negative for hearing loss, lump/mass and sore throat.   Eyes: Negative for eye problems and icterus.  Respiratory: Negative for chest tightness, cough and shortness of breath.   Cardiovascular: Negative for chest pain, leg swelling and palpitations.  Gastrointestinal: Negative for abdominal distention, abdominal pain, constipation, diarrhea, nausea and vomiting.  Endocrine: Negative for hot flashes.  Genitourinary: Negative for difficulty urinating.   Musculoskeletal: Negative for arthralgias.  Skin: Negative for itching and rash.  Neurological: Negative for dizziness, extremity weakness, headaches and numbness.  Hematological: Negative for adenopathy. Does not bruise/bleed easily.  Psychiatric/Behavioral: Negative for depression. The patient is not nervous/anxious.       PHYSICAL EXAMINATION  ECOG PERFORMANCE STATUS: 1 - Symptomatic but completely ambulatory  Vitals:   09/23/19 0827  BP: (!) 144/86  Pulse: 89  Resp: 18  Temp: 98.3 F (36.8 C)  SpO2: 100%    Physical Exam Constitutional:      General: She is not in acute distress.    Appearance: Normal appearance. She is not toxic-appearing.  HENT:     Head: Normocephalic and atraumatic.  Eyes:     General: No scleral icterus. Cardiovascular:     Rate and Rhythm: Normal rate and regular rhythm.     Pulses: Normal pulses.     Heart sounds: Normal heart sounds.  Pulmonary:     Effort: Pulmonary effort is normal.     Breath sounds: Normal breath sounds.  Abdominal:     General: Abdomen is flat. Bowel sounds are normal.     Palpations: Abdomen is soft.  Musculoskeletal:        General: No swelling.     Cervical back: Neck supple.  Lymphadenopathy:     Cervical: No cervical adenopathy.  Skin:    General: Skin is warm and dry.     Capillary Refill: Capillary refill takes less  than 2 seconds.     Findings: No rash.  Neurological:     General: No focal deficit present.     Mental Status: She is alert.  Psychiatric:        Mood and Affect: Mood normal.        Behavior: Behavior normal.     LABORATORY DATA:  CBC    Component Value Date/Time   WBC 10.6 (H) 09/23/2019 0808   RBC 3.87 09/23/2019 0808   HGB 10.9 (L) 09/23/2019 0808   HGB 13.5 07/20/2019 1405   HCT 33.5 (L) 09/23/2019 0808   HCT 40.5 07/20/2019 1405   PLT 400 09/23/2019 0808   PLT 365 07/20/2019 1405   MCV 86.6 09/23/2019 0808   MCV 84 07/20/2019 1405   MCH 28.2 09/23/2019 0808   MCHC 32.5 09/23/2019 0808   RDW 15.1 09/23/2019 0808   RDW 12.9 07/20/2019 1405   LYMPHSABS 1.9 09/23/2019 0808   LYMPHSABS 1.2 12/24/2017 0843   MONOABS 0.9 09/23/2019 0808   EOSABS 0.0 09/23/2019 0808   EOSABS 0.2 12/24/2017 0843   BASOSABS 0.1 09/23/2019 0808   BASOSABS 0.1 12/24/2017 0843    CMP     Component Value Date/Time   NA 143 09/23/2019 0808   NA 140 07/20/2019 1405   K 3.7 09/23/2019 0808   CL 110 09/23/2019 0808   CO2 24 09/23/2019 0808   GLUCOSE 100 (H) 09/23/2019 0808   BUN 13 09/23/2019 0808   BUN 10 07/20/2019 1405   CREATININE 0.79 09/23/2019 0808   CALCIUM 8.8 (L) 09/23/2019 0808   PROT 6.7 09/23/2019 0808   PROT 7.4 07/20/2019 1405   ALBUMIN 3.5 09/23/2019 0808   ALBUMIN 4.4 07/20/2019 1405   AST 16 09/23/2019 0808   ALT 19 09/23/2019 0808   ALKPHOS 102 09/23/2019 0808   BILITOT 0.3 09/23/2019 0808   GFRNONAA >60 09/23/2019 0808   GFRAA >60 09/23/2019 0808          ASSESSMENT and THERAPY PLAN:   Malignant neoplasm of upper-outer quadrant of right breast in female, estrogen receptor positive ([place]) 07/28/2019:Screening mammogram detected right breast mass 11:30 position subareolar 1.3 cm, indeterminate 5 mm mass: Biopsy fibroadenoma, right axillary lymph node present, biopsy of the lymph node and the mass revealed grade 2 IDC ER 10%, PR 0%, Ki-67 45%, HER-2 +3+ by  [facility] T1  cN1 M0 stage IIa  Treatment plan: 1. Neoadjuvant chemotherapy with TCH Perjeta 6 cycles followed by HerceptinPerjeta versus Kadcylamaintenance for 1 year 2. Followed by breast conserving surgery with sentinel lymph node study 3. Followed by adjuvant radiation therapy if patient had lumpectomy  Breast MRI: 3.8 cm tumor and 1 axillary lymph node.  ---------------------------------------------------------------------------------------------------------------------------------------------------- Current treatment: Cycle 2 day 1 TCH Perjeta Echocardiogram EF 60 to 65% 08/25/2019  Chemo toxicities:   She is tolerating chemotherapy well.   No peripheral neuropathy Back pain with Neulasta, Claritin daily along with tylenol was recommended Labs reviewed and stable.  She will proceed with treatment today with her second cycle of TCHP.      All questions were answered. The patient knows to call the clinic with any problems, questions or concerns. We can certainly see the patient much sooner if necessary.  Total encounter time: 20 minutes*  Wilber Bihari, NP 09/23/19 9:13 AM Medical Oncology and Hematology [name] [place], [place] [address] Tel. [phone]    Fax. [phone]  *Total Encounter Time as defined by the Centers for Medicare and Medicaid Services includes, in addition to the face-to-face time of a patient visit (documented in the note above) non-face-to-face time: obtaining and reviewing outside history, ordering and reviewing medications, tests or procedures, care coordination (communications with other health care professionals or caregivers) and documentation in the medical record.

## 2019-09-23 NOTE — Patient Instructions (Signed)
[facility] Discharge Instructions for Patients Receiving Chemotherapy  Today you received the following chemotherapy agents ogivri; perjeta; taxotere; carboplatin  To help prevent nausea and vomiting after your treatment, we encourage you to take your nausea medication as directed   If you develop nausea and vomiting that is not controlled by your nausea medication, call the clinic.   BELOW ARE SYMPTOMS THAT SHOULD BE REPORTED IMMEDIATELY:  *FEVER GREATER THAN 100.5 F  *CHILLS WITH OR WITHOUT FEVER  NAUSEA AND VOMITING THAT IS NOT CONTROLLED WITH YOUR NAUSEA MEDICATION  *UNUSUAL SHORTNESS OF BREATH  *UNUSUAL BRUISING OR BLEEDING  TENDERNESS IN MOUTH AND THROAT WITH OR WITHOUT PRESENCE OF ULCERS  *URINARY PROBLEMS  *BOWEL PROBLEMS  UNUSUAL RASH Items with * indicate a potential emergency and should be followed up as soon as possible.  Feel free to call the clinic should you have any questions or concerns. The clinic phone number is [phone].  Please show the [place] at check-in to the Emergency Department and triage nurse.

## 2019-09-23 NOTE — Patient Instructions (Signed)

## 2019-09-24 ENCOUNTER — Telehealth: Payer: Self-pay | Admitting: Adult Health

## 2019-09-24 NOTE — Telephone Encounter (Signed)
No 6/23 los. No changes made to pt's schedule.

## 2019-09-25 ENCOUNTER — Other Ambulatory Visit: Payer: Self-pay

## 2019-09-25 ENCOUNTER — Inpatient Hospital Stay: Payer: BC Managed Care – PPO

## 2019-09-25 VITALS — BP 131/80 | HR 64 | Resp 18

## 2019-09-25 DIAGNOSIS — C50411 Malignant neoplasm of upper-outer quadrant of right female breast: Secondary | ICD-10-CM | POA: Diagnosis not present

## 2019-09-25 DIAGNOSIS — Z5111 Encounter for antineoplastic chemotherapy: Secondary | ICD-10-CM | POA: Diagnosis not present

## 2019-09-25 DIAGNOSIS — Z170 Estrogen receptor positive status [ER+]: Secondary | ICD-10-CM | POA: Diagnosis not present

## 2019-09-25 DIAGNOSIS — Z5112 Encounter for antineoplastic immunotherapy: Secondary | ICD-10-CM | POA: Diagnosis not present

## 2019-09-25 DIAGNOSIS — Z5189 Encounter for other specified aftercare: Secondary | ICD-10-CM | POA: Diagnosis not present

## 2019-09-25 MED ORDER — PEGFILGRASTIM-JMDB 6 MG/0.6ML ~~LOC~~ SOSY
6.0000 mg | PREFILLED_SYRINGE | Freq: Once | SUBCUTANEOUS | Status: AC
  Administered 2019-09-25: 6 mg via SUBCUTANEOUS

## 2019-09-25 MED ORDER — PEGFILGRASTIM-JMDB 6 MG/0.6ML ~~LOC~~ SOSY
PREFILLED_SYRINGE | SUBCUTANEOUS | Status: AC
  Filled 2019-09-25: qty 0.6

## 2019-09-25 NOTE — Patient Instructions (Signed)

## 2019-10-12 DIAGNOSIS — N870 Mild cervical dysplasia: Secondary | ICD-10-CM | POA: Diagnosis not present

## 2019-10-12 DIAGNOSIS — N72 Inflammatory disease of cervix uteri: Secondary | ICD-10-CM | POA: Diagnosis not present

## 2019-10-12 DIAGNOSIS — R87610 Atypical squamous cells of undetermined significance on cytologic smear of cervix (ASC-US): Secondary | ICD-10-CM | POA: Diagnosis not present

## 2019-10-12 DIAGNOSIS — R87810 Cervical high risk human papillomavirus (HPV) DNA test positive: Secondary | ICD-10-CM | POA: Diagnosis not present

## 2019-10-13 NOTE — Progress Notes (Signed)
Patient Care Team: [name], DO as PCP - General (Family Medicine) Mauro Kaufmann, RN as Oncology Nurse Navigator Rockwell Germany, RN as Oncology Nurse Navigator Gwyndolyn Kaufman, RN as Registered Nurse  DIAGNOSIS:    ICD-10-CM   1. Malignant neoplasm of upper-outer quadrant of right breast in female, estrogen receptor positive ([facility])  C50.411    Z17.0     SUMMARY OF ONCOLOGIC HISTORY: Oncology History  Malignant neoplasm of upper-outer quadrant of right breast in female, estrogen receptor positive ([place])  07/28/2019 Initial Diagnosis   Screening mammogram detected right breast mass 11:30 position subareolar 1.3 cm, indeterminate 5 mm mass: Biopsy fibroadenoma, right axillary lymph node present, biopsy of the lymph node and the mass revealed grade 2 IDC ER 10%, PR 0%, Ki-67 45%, HER-2 +3+ by [facility]   08/20/2019 Cancer Staging   Staging form: Breast, AJCC 8th Edition - Clinical stage from 08/20/2019: Stage IIA (cT1c, cN1, cM0, G2, ER+, PR-, HER2+) - Signed by Nicholas Lose, MD on 08/20/2019   09/02/2019 -  Chemotherapy   The patient had dexamethasone (DECADRON) 4 MG tablet, 4 mg (100 % of original dose 4 mg), Oral, Daily, 1 of 1 cycle, Start date: 08/20/2019, End date: -- Dose modification: 4 mg (original dose 4 mg, Cycle 0) palonosetron (ALOXI) injection 0.25 mg, 0.25 mg, Intravenous,  Once, 2 of 6 cycles Administration: 0.25 mg (09/02/2019), 0.25 mg (09/23/2019) pegfilgrastim-jmdb (FULPHILA) injection 6 mg, 6 mg, Subcutaneous,  Once, 2 of 6 cycles Administration: 6 mg (09/04/2019), 6 mg (09/25/2019) CARBOplatin (PARAPLATIN) 700 mg in sodium chloride 0.9 % 250 mL chemo infusion, 700 mg (100 % of original dose 700 mg), Intravenous,  Once, 2 of 6 cycles Dose modification: 700 mg (original dose 700 mg, Cycle 1) Administration: 700 mg (09/02/2019), 700 mg (09/23/2019) DOCEtaxel (TAXOTERE) 150 mg in sodium chloride 0.9 % 250 mL chemo infusion, 75 mg/m2 = 150 mg, Intravenous,  Once, 2 of 6  cycles Administration: 150 mg (09/02/2019), 150 mg (09/23/2019) fosaprepitant (EMEND) 150 mg in sodium chloride 0.9 % 145 mL IVPB, 150 mg, Intravenous,  Once, 2 of 6 cycles Administration: 150 mg (09/02/2019), 150 mg (09/23/2019) pertuzumab (PERJETA) 420 mg in sodium chloride 0.9 % 250 mL chemo infusion, 420 mg (100 % of original dose 420 mg), Intravenous, Once, 2 of 6 cycles Dose modification: 420 mg (original dose 420 mg, Cycle 1, Reason: Provider Judgment) Administration: 420 mg (09/02/2019), 420 mg (09/23/2019) trastuzumab-dkst (OGIVRI) 672 mg in sodium chloride 0.9 % 250 mL chemo infusion, 8 mg/kg = 672 mg, Intravenous,  Once, 2 of 6 cycles Administration: 672 mg (09/02/2019), 504 mg (09/23/2019)  for chemotherapy treatment.      CHIEF COMPLIANT: Cycle 3 TCH Perjeta  INTERVAL HISTORY: BERNARD DONAHOO is a 54 y.o. with above-mentioned history of right breast cancer. She is currently on neoadjuvant chemotherapy with [place].She presents to the clinic todayfor a toxicity check and cycle 3. After last cycle of chemo she had slightly extra fatigue and she had to take an extra day off work.  She also had nausea but did not throw up.  The bone pain is much better because of Tylenol and Claritin.  Diarrhea is intermittent and Imodium helps take care of it.  Her taste and appetite goes away for a week after chemo but then it recovers.  ALLERGIES:  is allergic to hyoscyamine, caffeine, doxycycline, and nexium [esomeprazole].  MEDICATIONS:  Current Outpatient Medications  Medication Sig Dispense Refill   dexamethasone (DECADRON)  4 MG tablet Take 1 tablet (4 mg total) by mouth daily. Take 1 tablet day before chemo and 1 tablet day after chemo with food 12 tablet 0   lidocaine-prilocaine (EMLA) cream Apply to affected area once 30 g 3   LORazepam (ATIVAN) 0.5 MG tablet Take 1 tablet (0.5 mg total) by mouth at bedtime as needed for sleep. 30 tablet 0   Minocycline HCl 90 MG TB24 Take 1 tablet by mouth  daily. Only takes prn for break outs     ondansetron (ZOFRAN) 8 MG tablet Take 1 tablet (8 mg total) by mouth 2 (two) times daily as needed (Nausea or vomiting). Begin 4 days after chemotherapy. 30 tablet 1   prochlorperazine (COMPAZINE) 10 MG tablet Take 1 tablet (10 mg total) by mouth every 6 (six) hours as needed (Nausea or vomiting). 30 tablet 1   rivaroxaban (XARELTO) 20 MG TABS tablet Take 1 tablet (20 mg total) by mouth daily with supper. x5 months. Then stop. 30 tablet 4   saccharomyces boulardii (FLORASTOR) 250 MG capsule Take 250 mg by mouth daily.     No current facility-administered medications for this visit.   Facility-Administered Medications Ordered in Other Visits  Medication Dose Route Frequency Provider Last Rate Last Admin   sodium chloride flush (NS) 0.9 % injection 10 mL  10 mL Intravenous PRN Nicholas Lose, MD   10 mL at 09/02/19 0839    PHYSICAL EXAMINATION: ECOG PERFORMANCE STATUS: 1 - Symptomatic but completely ambulatory  Vitals:   10/14/19 0805  BP: 127/82  Pulse: 78  Resp: 18  Temp: 98.3 F (36.8 C)  SpO2: 100%   Filed Weights   10/14/19 0805  Weight: 182 lb 3.2 oz (82.6 kg)     LABORATORY DATA:  I have reviewed the data as listed CMP Latest Ref Rng & Units 09/23/2019 09/09/2019 09/02/2019  Glucose 70 - 99 mg/dL 100(H) 121(H) 116(H)  BUN 6 - 20 mg/dL _0 Creatinine 0.44 - 1.00 mg/dL 0.79 0.77 0.73  Sodium 135 - 145 mmol/L 143 135 141  Potassium 3.5 - 5.1 mmol/L 3.7 3.9 3.9  Chloride 98 - 111 mmol/L 110 97(L) 108  CO2 22 - 32 mmol/L _1 Calcium 8.9 - 10.3 mg/dL 8.8(L) 9.3 9.3  Total Protein 6.5 - 8.1 g/dL 6.7 7.4 7.4  Total Bilirubin 0.3 - 1.2 mg/dL 0.3 0.5 0.4  Alkaline Phos 38 - 126 U/L 102 105 97  AST 15 - 41 U/L _2 ALT 0 - 44 U/L 19 41 16    Lab Results  Component Value Date   WBC 7.4 10/14/2019   HGB 10.3 (L) 10/14/2019   HCT 31.3 (L) 10/14/2019   MCV 89.9 10/14/2019   PLT 290 10/14/2019   NEUTROABS 5.0  10/14/2019    ASSESSMENT & PLAN:  Malignant neoplasm of upper-outer quadrant of right breast in female, estrogen receptor positive ([place]) 07/28/2019:Screening mammogram detected right breast mass 11:30 position subareolar 1.3 cm, indeterminate 5 mm mass: Biopsy fibroadenoma, right axillary lymph node present, biopsy of the lymph node and the mass revealed grade 2 IDC ER 10%, PR 0%, Ki-67 45%, HER-2 +3+ by IHC T1 cN1 M0 stage IIa  Treatment plan: 1. Neoadjuvant chemotherapy with TCH Perjeta 6 cycles followed by HerceptinPerjeta versus Kadcylamaintenance for 1 year 2. Followed by breast conserving surgery with sentinel lymph node study 3. Followed by adjuvant radiation therapy if patient had lumpectomy  Breast MRI: 3.8 cm tumor and 1 axillary  lymphnode. ---------------------------------------------------------------------------------------------------------------------------------------------------- Current treatment:Cycle 3 day 1 TCH Perjeta Echocardiogram EF 60 to 65% 08/25/2019  Chemo toxicities:   Worsening fatigue Intermittent nausea Intermittent diarrhea Chemotherapy-induced anemia Back pain with Neulasta, Claritin daily along with tylenol  No peripheral neuropathy  Labs reviewed  RTC in 3 weeks for cycle 4    No orders of the defined types were placed in this encounter.  The patient has a good understanding of the overall plan. she agrees with it. she will call with any problems that may develop before the next visit here.  Total time spent: 30 mins including face to face time and time spent for planning, charting and coordination of care  Nicholas Lose, MD 10/14/2019  I, Cloyde Reams Dorshimer, am acting as scribe for Dr. Nicholas Lose.  I have reviewed the above documentation for accuracy and completeness, and I agree with the above.

## 2019-10-14 ENCOUNTER — Inpatient Hospital Stay: Payer: BC Managed Care – PPO

## 2019-10-14 ENCOUNTER — Encounter: Payer: Self-pay | Admitting: *Deleted

## 2019-10-14 ENCOUNTER — Other Ambulatory Visit: Payer: Self-pay

## 2019-10-14 ENCOUNTER — Inpatient Hospital Stay (HOSPITAL_BASED_OUTPATIENT_CLINIC_OR_DEPARTMENT_OTHER): Payer: BC Managed Care – PPO | Admitting: Hematology and Oncology

## 2019-10-14 ENCOUNTER — Inpatient Hospital Stay: Payer: BC Managed Care – PPO | Attending: Hematology and Oncology

## 2019-10-14 DIAGNOSIS — C50411 Malignant neoplasm of upper-outer quadrant of right female breast: Secondary | ICD-10-CM

## 2019-10-14 DIAGNOSIS — R110 Nausea: Secondary | ICD-10-CM | POA: Diagnosis not present

## 2019-10-14 DIAGNOSIS — R630 Anorexia: Secondary | ICD-10-CM | POA: Insufficient documentation

## 2019-10-14 DIAGNOSIS — Z5189 Encounter for other specified aftercare: Secondary | ICD-10-CM | POA: Insufficient documentation

## 2019-10-14 DIAGNOSIS — Z170 Estrogen receptor positive status [ER+]: Secondary | ICD-10-CM

## 2019-10-14 DIAGNOSIS — Z95828 Presence of other vascular implants and grafts: Secondary | ICD-10-CM

## 2019-10-14 DIAGNOSIS — C773 Secondary and unspecified malignant neoplasm of axilla and upper limb lymph nodes: Secondary | ICD-10-CM | POA: Diagnosis not present

## 2019-10-14 DIAGNOSIS — E876 Hypokalemia: Secondary | ICD-10-CM | POA: Insufficient documentation

## 2019-10-14 DIAGNOSIS — Z5112 Encounter for antineoplastic immunotherapy: Secondary | ICD-10-CM | POA: Insufficient documentation

## 2019-10-14 DIAGNOSIS — Z5111 Encounter for antineoplastic chemotherapy: Secondary | ICD-10-CM | POA: Insufficient documentation

## 2019-10-14 DIAGNOSIS — R197 Diarrhea, unspecified: Secondary | ICD-10-CM | POA: Insufficient documentation

## 2019-10-14 LAB — CMP (CANCER CENTER ONLY)
ALT: 36 U/L (ref 0–44)
AST: 25 U/L (ref 15–41)
Albumin: 3.5 g/dL (ref 3.5–5.0)
Alkaline Phosphatase: 101 U/L (ref 38–126)
Anion gap: 8 (ref 5–15)
BUN: 10 mg/dL (ref 6–20)
CO2: 24 mmol/L (ref 22–32)
Calcium: 9.1 mg/dL (ref 8.9–10.3)
Chloride: 109 mmol/L (ref 98–111)
Creatinine: 0.62 mg/dL (ref 0.44–1.00)
GFR, Est AFR Am: >60 mL/min (ref >60)
GFR, Estimated: >60 mL/min (ref >60)
Glucose, Bld: 118 mg/dL — ABNORMAL HIGH (ref 70–99)
Potassium: 3.8 mmol/L (ref 3.5–5.1)
Sodium: 141 mmol/L (ref 135–145)
Total Bilirubin: 0.3 mg/dL (ref 0.3–1.2)
Total Protein: 6.6 g/dL (ref 6.5–8.1)

## 2019-10-14 LAB — CBC WITH DIFFERENTIAL (CANCER CENTER ONLY)
Abs Immature Granulocytes: 0.05 10*3/uL (ref 0.00–0.07)
Basophils Absolute: 0.1 10*3/uL (ref 0.0–0.1)
Basophils Relative: 1 %
Eosinophils Absolute: 0 10*3/uL (ref 0.0–0.5)
Eosinophils Relative: 0 %
HCT: 31.3 % — ABNORMAL LOW (ref 36.0–46.0)
Hemoglobin: 10.3 g/dL — ABNORMAL LOW (ref 12.0–15.0)
Immature Granulocytes: 1 %
Lymphocytes Relative: 20 %
Lymphs Abs: 1.5 10*3/uL (ref 0.7–4.0)
MCH: 29.6 pg (ref 26.0–34.0)
MCHC: 32.9 g/dL (ref 30.0–36.0)
MCV: 89.9 fL (ref 80.0–100.0)
Monocytes Absolute: 0.9 10*3/uL (ref 0.1–1.0)
Monocytes Relative: 12 %
Neutro Abs: 5 10*3/uL (ref 1.7–7.7)
Neutrophils Relative %: 66 %
Platelet Count: 290 10*3/uL (ref 150–400)
RBC: 3.48 MIL/uL — ABNORMAL LOW (ref 3.87–5.11)
RDW: 17.3 % — ABNORMAL HIGH (ref 11.5–15.5)
WBC Count: 7.4 10*3/uL (ref 4.0–10.5)
nRBC: 0 % (ref 0.0–0.2)

## 2019-10-14 MED ORDER — SODIUM CHLORIDE 0.9 % IV SOLN
Freq: Once | INTRAVENOUS | Status: AC
  Filled 2019-10-14: qty 250

## 2019-10-14 MED ORDER — TRASTUZUMAB-DKST CHEMO 150 MG IV SOLR
6.0000 mg/kg | Freq: Once | INTRAVENOUS | Status: AC
  Administered 2019-10-14: 504 mg via INTRAVENOUS
  Filled 2019-10-14: qty 24

## 2019-10-14 MED ORDER — DIPHENHYDRAMINE HCL 25 MG PO CAPS
ORAL_CAPSULE | ORAL | Status: AC
  Filled 2019-10-14: qty 2

## 2019-10-14 MED ORDER — SODIUM CHLORIDE 0.9 % IV SOLN
700.0000 mg | Freq: Once | INTRAVENOUS | Status: AC
  Administered 2019-10-14: 700 mg via INTRAVENOUS
  Filled 2019-10-14: qty 70

## 2019-10-14 MED ORDER — HEPARIN SOD (PORK) LOCK FLUSH 100 UNIT/ML IV SOLN
500.0000 [IU] | Freq: Once | INTRAVENOUS | Status: AC | PRN
  Administered 2019-10-14: 500 [IU]
  Filled 2019-10-14: qty 5

## 2019-10-14 MED ORDER — PALONOSETRON HCL INJECTION 0.25 MG/5ML
0.2500 mg | Freq: Once | INTRAVENOUS | Status: AC
  Administered 2019-10-14: 0.25 mg via INTRAVENOUS

## 2019-10-14 MED ORDER — ACETAMINOPHEN 325 MG PO TABS
650.0000 mg | ORAL_TABLET | Freq: Once | ORAL | Status: AC
  Administered 2019-10-14: 650 mg via ORAL

## 2019-10-14 MED ORDER — SODIUM CHLORIDE 0.9 % IV SOLN
75.0000 mg/m2 | Freq: Once | INTRAVENOUS | Status: AC
  Administered 2019-10-14: 150 mg via INTRAVENOUS
  Filled 2019-10-14: qty 15

## 2019-10-14 MED ORDER — PALONOSETRON HCL INJECTION 0.25 MG/5ML
INTRAVENOUS | Status: AC
  Filled 2019-10-14: qty 5

## 2019-10-14 MED ORDER — ACETAMINOPHEN 325 MG PO TABS
ORAL_TABLET | ORAL | Status: AC
  Filled 2019-10-14: qty 2

## 2019-10-14 MED ORDER — SODIUM CHLORIDE 0.9 % IV SOLN
150.0000 mg | Freq: Once | INTRAVENOUS | Status: AC
  Administered 2019-10-14: 150 mg via INTRAVENOUS
  Filled 2019-10-14: qty 150

## 2019-10-14 MED ORDER — DIPHENHYDRAMINE HCL 25 MG PO CAPS
50.0000 mg | ORAL_CAPSULE | Freq: Once | ORAL | Status: AC
  Administered 2019-10-14: 50 mg via ORAL

## 2019-10-14 MED ORDER — SODIUM CHLORIDE 0.9 % IV SOLN
420.0000 mg | Freq: Once | INTRAVENOUS | Status: AC
  Administered 2019-10-14: 420 mg via INTRAVENOUS
  Filled 2019-10-14: qty 14

## 2019-10-14 MED ORDER — SODIUM CHLORIDE 0.9% FLUSH
10.0000 mL | Freq: Once | INTRAVENOUS | Status: AC
  Administered 2019-10-14: 10 mL
  Filled 2019-10-14: qty 10

## 2019-10-14 MED ORDER — SODIUM CHLORIDE 0.9 % IV SOLN
10.0000 mg | Freq: Once | INTRAVENOUS | Status: AC
  Administered 2019-10-14: 10 mg via INTRAVENOUS
  Filled 2019-10-14: qty 10

## 2019-10-14 MED ORDER — SODIUM CHLORIDE 0.9% FLUSH
10.0000 mL | INTRAVENOUS | Status: DC | PRN
  Administered 2019-10-14: 10 mL
  Filled 2019-10-14: qty 10

## 2019-10-14 NOTE — Research (Signed)
L0786 - STUDYING HOW CANCER TREATMENT AFFECTS THE NERVES IN YOUR HANDS AND FEET   10/14/2019 0730AM   Stephanie Brewer arrives today to begin cycle 3 of her chemotherapy and complete her 4-week S1714 visit; she is unaccompanied today.  REVISION #5: Per SWOG protocol coordinator, patients should be advised of revision #5: no reconsent is required. [name] was advised of the revision and informed that the revision will not affect her participation or her visits: she verbalizes understanding.  PROs: Upon arrival, [name] is provided the 4-week PROs for completion; they are checked for completeness and there are no unanswered questions.  LABS: There are no research labs scheduled for today since [name] did not consent to optional lab specimens.  PROVIDER VISIT: [name] completed and signed the solicited neuropathy event form: [name] currently has no complaints of dysesthesia, neuralgia, paresthesia or any neuropathy symptoms. [name] also completed the follow-up physician assessment form for toxicity burden with a score of 2.  NEUROPEN/TUNING FORK: Following her provider visit, the neuropen and tuning fork assessments were completed without difficulty. Only the interphalangeal joint of the great left toe (dominant side) was tested for vibration on the lower extremity since vibration was noted for >15 seconds at that site. Remaining upper extremity vibration was tested (on the left) per protocol with the assistance of clinical research coordinator Carol Ada as a Pharmacist, hospital.  MEDICATIONS: [name] was given an opportunity to review her current med list which is accurate with the addition of a new probiotic that she started around 09/16/19 for gas and bloating following chemotherapy treatment. She is no longer taking diazepam or antivert (stopped 09/09/19) and took Lovenox prior to surgery only (has been discontinued on the med record since our last visit).  SUPPLEMENTS/TOPICAL AGENTS/ALTERNATIVE MEDICINES: [name]  denies taking any vitamins or supplements; she is currently not using any topical agents. The only complementary therapy used are ice packs to the hands and feet during chemotherapy administration to prevent neuropathy symptoms (she currently has no symptoms).   Upon completion of the S1714 assessments, [name] is escorted to the infusion waiting area. [name] was thanked for her time and continued participation in the S1714 study. The current plan is to see [name] for her 8-week visit at the beginning of cycle 4 on 11/04/2019. [name] is encouraged to call me for any needs or questions and she verbalizes understanding. A business card with my direct contact information and date of our next appointment is provided.  Dionne Bucy. Sharlett Iles, BSN, RN, CIC 10/14/2019 10:15 AM   TREATMENT: Jackelyn completed her cycle 3 treatment as planned with no changes in dosing or frequency today.  Dionne Bucy. Sharlett Iles, BSN, RN, CIC 10/14/2019 3:57 PM

## 2019-10-14 NOTE — Patient Instructions (Signed)
[facility] Discharge Instructions for Patients Receiving Chemotherapy  Today you received the following chemotherapy agents Trastuzumab (OGIVRI), Pertuzumab (PERJETA), Docetaxel (TAXOTERE) & Carboplatin (PARAPLATIN).  To help prevent nausea and vomiting after your treatment, we encourage you to take your nausea medication as prescribed.   If you develop nausea and vomiting that is not controlled by your nausea medication, call the clinic.   BELOW ARE SYMPTOMS THAT SHOULD BE REPORTED IMMEDIATELY:  *FEVER GREATER THAN 100.5 F  *CHILLS WITH OR WITHOUT FEVER  NAUSEA AND VOMITING THAT IS NOT CONTROLLED WITH YOUR NAUSEA MEDICATION  *UNUSUAL SHORTNESS OF BREATH  *UNUSUAL BRUISING OR BLEEDING  TENDERNESS IN MOUTH AND THROAT WITH OR WITHOUT PRESENCE OF ULCERS  *URINARY PROBLEMS  *BOWEL PROBLEMS  UNUSUAL RASH Items with * indicate a potential emergency and should be followed up as soon as possible.  Feel free to call the clinic should you have any questions or concerns. The clinic phone number is [phone].  Please show the CHEMO ALERT CARD at check-in to the Emergency Department and triage nurse.   

## 2019-10-14 NOTE — Assessment & Plan Note (Signed)
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 Neoadjuvant chemotherapy with TCH Perjeta 6 cycles followed by HerceptinPerjeta versus Kadcylamaintenance for 1 year 2. Followed by breast conserving surgery with sentinel lymph node study 3. Followed by adjuvant radiation therapy if patient had lumpectomy  Breast MRI: 3.8 cm tumor and 1 axillary lymphnode. ---------------------------------------------------------------------------------------------------------------------------------------------------- Current treatment:Cycle 3 day 1 TCH Perjeta Echocardiogram EF 60 to 65% 08/25/2019  Chemo toxicities:   She is tolerating chemotherapy well.   No peripheral neuropathy Back pain with Neulasta, Claritin daily along with tyleno Labs reviewed and stable.  RTC in 3 weeks for cycle 4

## 2019-10-15 ENCOUNTER — Telehealth: Payer: Self-pay | Admitting: Hematology and Oncology

## 2019-10-15 NOTE — Telephone Encounter (Signed)
No 7/14 los, no changes made to pt schedule  

## 2019-10-16 ENCOUNTER — Other Ambulatory Visit: Payer: Self-pay

## 2019-10-16 ENCOUNTER — Inpatient Hospital Stay: Payer: BC Managed Care – PPO

## 2019-10-16 VITALS — BP 140/81 | HR 71 | Temp 98.3°F | Resp 18

## 2019-10-16 DIAGNOSIS — R197 Diarrhea, unspecified: Secondary | ICD-10-CM | POA: Diagnosis not present

## 2019-10-16 DIAGNOSIS — Z170 Estrogen receptor positive status [ER+]: Secondary | ICD-10-CM

## 2019-10-16 DIAGNOSIS — C773 Secondary and unspecified malignant neoplasm of axilla and upper limb lymph nodes: Secondary | ICD-10-CM | POA: Diagnosis not present

## 2019-10-16 DIAGNOSIS — C50411 Malignant neoplasm of upper-outer quadrant of right female breast: Secondary | ICD-10-CM

## 2019-10-16 DIAGNOSIS — R110 Nausea: Secondary | ICD-10-CM | POA: Diagnosis not present

## 2019-10-16 DIAGNOSIS — Z5111 Encounter for antineoplastic chemotherapy: Secondary | ICD-10-CM | POA: Diagnosis not present

## 2019-10-16 DIAGNOSIS — Z5112 Encounter for antineoplastic immunotherapy: Secondary | ICD-10-CM | POA: Diagnosis not present

## 2019-10-16 DIAGNOSIS — R630 Anorexia: Secondary | ICD-10-CM | POA: Diagnosis not present

## 2019-10-16 DIAGNOSIS — Z5189 Encounter for other specified aftercare: Secondary | ICD-10-CM | POA: Diagnosis not present

## 2019-10-16 DIAGNOSIS — E876 Hypokalemia: Secondary | ICD-10-CM | POA: Diagnosis not present

## 2019-10-16 MED ORDER — PEGFILGRASTIM-JMDB 6 MG/0.6ML ~~LOC~~ SOSY
PREFILLED_SYRINGE | SUBCUTANEOUS | Status: AC
  Filled 2019-10-16: qty 0.6

## 2019-10-16 MED ORDER — PEGFILGRASTIM-JMDB 6 MG/0.6ML ~~LOC~~ SOSY
6.0000 mg | PREFILLED_SYRINGE | Freq: Once | SUBCUTANEOUS | Status: AC
  Administered 2019-10-16: 6 mg via SUBCUTANEOUS

## 2019-10-16 NOTE — Patient Instructions (Signed)

## 2019-10-26 ENCOUNTER — Telehealth: Payer: Self-pay

## 2019-10-26 ENCOUNTER — Other Ambulatory Visit: Payer: Self-pay

## 2019-10-26 ENCOUNTER — Inpatient Hospital Stay: Payer: BC Managed Care – PPO

## 2019-10-26 ENCOUNTER — Other Ambulatory Visit: Payer: Self-pay | Admitting: Hematology and Oncology

## 2019-10-26 ENCOUNTER — Telehealth: Payer: Self-pay | Admitting: *Deleted

## 2019-10-26 ENCOUNTER — Other Ambulatory Visit: Payer: Self-pay | Admitting: *Deleted

## 2019-10-26 ENCOUNTER — Inpatient Hospital Stay (HOSPITAL_BASED_OUTPATIENT_CLINIC_OR_DEPARTMENT_OTHER): Payer: BC Managed Care – PPO | Admitting: Medical

## 2019-10-26 VITALS — BP 135/88 | HR 98 | Temp 99.0°F | Resp 17 | Ht 64.0 in | Wt 173.6 lb

## 2019-10-26 VITALS — BP 134/75 | HR 71 | Temp 98.5°F | Resp 20

## 2019-10-26 DIAGNOSIS — E876 Hypokalemia: Secondary | ICD-10-CM | POA: Diagnosis not present

## 2019-10-26 DIAGNOSIS — C50411 Malignant neoplasm of upper-outer quadrant of right female breast: Secondary | ICD-10-CM

## 2019-10-26 DIAGNOSIS — Z170 Estrogen receptor positive status [ER+]: Secondary | ICD-10-CM | POA: Diagnosis not present

## 2019-10-26 DIAGNOSIS — Z5189 Encounter for other specified aftercare: Secondary | ICD-10-CM | POA: Diagnosis not present

## 2019-10-26 DIAGNOSIS — Z95828 Presence of other vascular implants and grafts: Secondary | ICD-10-CM

## 2019-10-26 DIAGNOSIS — Z5111 Encounter for antineoplastic chemotherapy: Secondary | ICD-10-CM | POA: Diagnosis not present

## 2019-10-26 DIAGNOSIS — R112 Nausea with vomiting, unspecified: Secondary | ICD-10-CM

## 2019-10-26 DIAGNOSIS — R197 Diarrhea, unspecified: Secondary | ICD-10-CM | POA: Diagnosis not present

## 2019-10-26 DIAGNOSIS — Z5112 Encounter for antineoplastic immunotherapy: Secondary | ICD-10-CM | POA: Diagnosis not present

## 2019-10-26 DIAGNOSIS — R110 Nausea: Secondary | ICD-10-CM | POA: Diagnosis not present

## 2019-10-26 DIAGNOSIS — C773 Secondary and unspecified malignant neoplasm of axilla and upper limb lymph nodes: Secondary | ICD-10-CM | POA: Diagnosis not present

## 2019-10-26 DIAGNOSIS — R630 Anorexia: Secondary | ICD-10-CM

## 2019-10-26 LAB — CMP (CANCER CENTER ONLY)
ALT: 68 U/L — ABNORMAL HIGH (ref 0–44)
AST: 43 U/L — ABNORMAL HIGH (ref 15–41)
Albumin: 3.9 g/dL (ref 3.5–5.0)
Alkaline Phosphatase: 127 U/L — ABNORMAL HIGH (ref 38–126)
Anion gap: 14 (ref 5–15)
BUN: 5 mg/dL — ABNORMAL LOW (ref 6–20)
CO2: 24 mmol/L (ref 22–32)
Calcium: 9.6 mg/dL (ref 8.9–10.3)
Chloride: 100 mmol/L (ref 98–111)
Creatinine: 0.81 mg/dL (ref 0.44–1.00)
GFR, Est AFR Am: >60 mL/min (ref >60)
GFR, Estimated: >60 mL/min (ref >60)
Glucose, Bld: 124 mg/dL — ABNORMAL HIGH (ref 70–99)
Potassium: 3 mmol/L — CL (ref 3.5–5.1)
Sodium: 138 mmol/L (ref 135–145)
Total Bilirubin: 0.4 mg/dL (ref 0.3–1.2)
Total Protein: 7.1 g/dL (ref 6.5–8.1)

## 2019-10-26 LAB — CBC WITH DIFFERENTIAL (CANCER CENTER ONLY)
Abs Immature Granulocytes: 0.19 10*3/uL — ABNORMAL HIGH (ref 0.00–0.07)
Basophils Absolute: 0.1 10*3/uL (ref 0.0–0.1)
Basophils Relative: 1 %
Eosinophils Absolute: 0 10*3/uL (ref 0.0–0.5)
Eosinophils Relative: 0 %
HCT: 35.5 % — ABNORMAL LOW (ref 36.0–46.0)
Hemoglobin: 11.9 g/dL — ABNORMAL LOW (ref 12.0–15.0)
Immature Granulocytes: 2 %
Lymphocytes Relative: 20 %
Lymphs Abs: 1.7 10*3/uL (ref 0.7–4.0)
MCH: 29.5 pg (ref 26.0–34.0)
MCHC: 33.5 g/dL (ref 30.0–36.0)
MCV: 87.9 fL (ref 80.0–100.0)
Monocytes Absolute: 0.8 10*3/uL (ref 0.1–1.0)
Monocytes Relative: 9 %
Neutro Abs: 6 10*3/uL (ref 1.7–7.7)
Neutrophils Relative %: 68 %
Platelet Count: 207 10*3/uL (ref 150–400)
RBC: 4.04 MIL/uL (ref 3.87–5.11)
RDW: 16.9 % — ABNORMAL HIGH (ref 11.5–15.5)
WBC Count: 8.8 10*3/uL (ref 4.0–10.5)
nRBC: 0 % (ref 0.0–0.2)

## 2019-10-26 LAB — MAGNESIUM: Magnesium: 1.9 mg/dL (ref 1.7–2.4)

## 2019-10-26 MED ORDER — POTASSIUM CHLORIDE CRYS ER 20 MEQ PO TBCR: 20.0000 meq | EXTENDED_RELEASE_TABLET | Freq: Every day | ORAL | 0 refills | Status: DC

## 2019-10-26 MED ORDER — DIPHENOXYLATE-ATROPINE 2.5-0.025 MG PO TABS
1.0000 | ORAL_TABLET | Freq: Once | ORAL | Status: AC
  Administered 2019-10-26: 1 via ORAL

## 2019-10-26 MED ORDER — POTASSIUM CHLORIDE CRYS ER 20 MEQ PO TBCR
EXTENDED_RELEASE_TABLET | ORAL | Status: AC
  Filled 2019-10-26: qty 2

## 2019-10-26 MED ORDER — POTASSIUM CHLORIDE CRYS ER 20 MEQ PO TBCR
40.0000 meq | EXTENDED_RELEASE_TABLET | Freq: Once | ORAL | Status: AC
  Administered 2019-10-26: 40 meq via ORAL

## 2019-10-26 MED ORDER — ONDANSETRON HCL 4 MG/2ML IJ SOLN
INTRAMUSCULAR | Status: AC
  Filled 2019-10-26: qty 4

## 2019-10-26 MED ORDER — DIPHENOXYLATE-ATROPINE 2.5-0.025 MG PO TABS
1.0000 | ORAL_TABLET | Freq: Four times a day (QID) | ORAL | 3 refills | Status: DC | PRN
Start: 2019-10-26 — End: 2019-12-04

## 2019-10-26 MED ORDER — DIPHENOXYLATE-ATROPINE 2.5-0.025 MG PO TABS
ORAL_TABLET | ORAL | Status: AC
  Filled 2019-10-26: qty 1

## 2019-10-26 MED ORDER — SODIUM CHLORIDE 0.9% FLUSH
10.0000 mL | Freq: Once | INTRAVENOUS | Status: AC
  Administered 2019-10-26: 10 mL
  Filled 2019-10-26: qty 10

## 2019-10-26 MED ORDER — SODIUM CHLORIDE 0.9 % IV SOLN
INTRAVENOUS | Status: AC
  Filled 2019-10-26: qty 250

## 2019-10-26 MED ORDER — HEPARIN SOD (PORK) LOCK FLUSH 100 UNIT/ML IV SOLN
500.0000 [IU] | Freq: Once | INTRAVENOUS | Status: AC
  Administered 2019-10-26: 500 [IU]
  Filled 2019-10-26: qty 5

## 2019-10-26 MED ORDER — ONDANSETRON HCL 4 MG/2ML IJ SOLN
8.0000 mg | Freq: Once | INTRAMUSCULAR | Status: AC
  Administered 2019-10-26: 8 mg via INTRAVENOUS

## 2019-10-26 NOTE — Telephone Encounter (Signed)
Pt called with c/o diarrhea not controlled with Immodium, having nausea and vomiting and a 10lb wt loss as well as mouth sores. Reached out to [employer]. Pt will be seen in [facility]. Urgent msg sent. Pt notified.

## 2019-10-26 NOTE — Telephone Encounter (Signed)
CRITICAL VALUE STICKER  CRITICAL VALUE: 3.0  RECEIVER (on-site recipient of call): Atha Starks CMA   DATE & TIME NOTIFIED: 1:27pm 10/26/19  MESSENGER (representative from lab): Lurlean Horns Director of nursing   MD NOTIFIED: Sandi Mealy   TIME OF NOTIFICATION: 1:27pm  RESPONSE: Already Notified

## 2019-10-26 NOTE — Patient Instructions (Signed)
Rehydration, Adult Rehydration is the replacement of body fluids and salts and minerals (electrolytes) that are lost during dehydration. Dehydration is when there is not enough fluid or water in the body. This happens when you lose more fluids than you take in. Common causes of dehydration include:  Vomiting.  Diarrhea.  Excessive sweating, such as from heat exposure or exercise.  Taking medicines that cause the body to lose excess fluid (diuretics).  Impaired kidney function.  Not drinking enough fluid.  Certain illnesses or infections.  Certain poorly controlled long-term (chronic) illnesses, such as diabetes, heart disease, and kidney disease.  Symptoms of mild dehydration may include thirst, dry lips and mouth, dry skin, and dizziness. Symptoms of severe dehydration may include increased heart rate, confusion, fainting, and not urinating. You can rehydrate by drinking certain fluids or getting fluids through an IV tube, as told by your health care provider. What are the risks? Generally, rehydration is safe. However, one problem that can happen is taking in too much fluid (overhydration). This is rare. If overhydration happens, it can cause an electrolyte imbalance, kidney failure, or a decrease in salt (sodium) levels in the body. How to rehydrate Follow instructions from your health care provider for rehydration. The kind of fluid you should drink and the amount you should drink depend on your condition.  If directed by your health care provider, drink an oral rehydration solution (ORS). This is a drink designed to treat dehydration that is found in pharmacies and retail stores. ? Make an ORS by following instructions on the package. ? Start by drinking small amounts, about  cup (120 mL) every 5-10 minutes. ? Slowly increase how much you drink until you have taken the amount recommended by your health care provider.  Drink enough clear fluids to keep your urine clear or pale  yellow. If you were instructed to drink an ORS, finish the ORS first, then start slowly drinking other clear fluids. Drink fluids such as: ? Water. Do not drink only water. Doing that can lead to having too little sodium in your body (hyponatremia). ? Ice chips. ? Fruit juice that you have added water to (diluted juice). ? Low-calorie sports drinks.  If you are severely dehydrated, your health care provider may recommend that you receive fluids through an IV tube in the hospital.  Do not take sodium tablets. Doing that can lead to the condition of having too much sodium in your body (hypernatremia). Eating while you rehydrate Follow instructions from your health care provider about what to eat while you rehydrate. Your health care provider may recommend that you slowly begin eating regular foods in small amounts.  Eat foods that contain a healthy balance of electrolytes, such as bananas, oranges, potatoes, tomatoes, and spinach.  Avoid foods that are greasy or contain a lot of fat or sugar.  In some cases, you may get nutrition through a feeding tube that is passed through your nose and into your stomach (nasogastric tube, or NG tube). This may be done if you have uncontrolled vomiting or diarrhea. Beverages to avoid Certain beverages may make dehydration worse. While you rehydrate, avoid:  Alcohol.  Caffeine.  Drinks that contain a lot of sugar. These include: ? High-calorie sports drinks. ? Fruit juice that is not diluted. ? Soda.  Check nutrition labels to see how much sugar or caffeine a beverage contains. Signs of dehydration recovery You may be recovering from dehydration if:  You are urinating more often than before you started   rehydrating.  Your urine is clear or pale yellow.  Your energy level improves.  You vomit less frequently.  You have diarrhea less frequently.  Your appetite improves or returns to normal.  You feel less dizzy or less light-headed.  Your  skin tone and color start to look more normal. Contact a health care provider if:  You continue to have symptoms of mild dehydration, such as: ? Thirst. ? Dry lips. ? Slightly dry mouth. ? Dry, warm skin. ? Dizziness.  You continue to vomit or have diarrhea. Get help right away if:  You have symptoms of dehydration that get worse.  You feel: ? Confused. ? Weak. ? Like you are going to faint.  You have not urinated in 6-8 hours.  You have very dark urine.  You have trouble breathing.  Your heart rate while sitting still is over 100 beats a minute.  You cannot drink fluids without vomiting.  You have vomiting or diarrhea that: ? Gets worse. ? Does not go away.  You have a fever. This information is not intended to replace advice given to you by your health care provider. Make sure you discuss any questions you have with your health care provider. Document Revised: 03/01/2017 Document Reviewed: 05/13/2015 Elsevier Patient Education  [employer].  Atropine; Diphenoxylate tablets What is this medicine? ATROPINE; DIPHENOXYLATE (A troe peen dye fen OX i late) is used to treat diarrhea. This medicine may be used for other purposes; ask your health care provider or pharmacist if you have questions. COMMON BRAND NAME(S): Lomotil, Lonox, Vi-Atro What should I tell my health care provider before I take this medicine? They need to know if you have any of these conditions:  bacterial food poisoning  colitis  dehydration  Down's syndrome  jaundice or liver disease  an unusual or allergic reaction to atropine, diphenoxylate, other medicines, foods, dyes, or preservatives  pregnant or trying to get pregnant  breast-feeding How should I use this medicine? Take this medicine by mouth with a glass of water. Follow the directions on the prescription label. You can take the tablets with food. Take your doses at regular intervals. Do not take your medicine more often  than directed. Once your diarrhea has been brought under control your doctor or health care professional may reduce your doses. Talk to your pediatrician regarding the use of this medicine in children. Special care may be needed. Elderly patients may be more sensitive to the effects of this medicine. Overdosage: If you think you have taken too much of this medicine contact a poison control center or emergency room at once. NOTE: This medicine is only for you. Do not share this medicine with others. What if I miss a dose? If you miss a dose, take it as soon as you can. If it is almost time for your next dose, take only that dose. Do not take double or extra doses. What may interact with this medicine?  alcohol  antihistamines for allergy, cough and cold  barbiturate medicines for inducing sleep or treating seizures  certain medicines for depression, anxiety, or psychotic disturbances  certain medicines for sleep  medicines for movement abnormalities as in Parkinson's disease, or for gastrointestinal problems  muscle relaxants  narcotic medicines (opiates) for pain This list may not describe all possible interactions. Give your health care provider a list of all the medicines, herbs, non-prescription drugs, or dietary supplements you use. Also tell them if you smoke, drink alcohol, or use illegal drugs.  Some items may interact with your medicine. What should I watch for while using this medicine? If your symptoms do not start to get better after taking this medicine for two days, check with your doctor or health care professional, you may have a problem that needs further evaluation. Check with your doctor or health care professional right away if you develop a fever or bloody diarrhea. You may get drowsy or dizzy. Do not drive, use machinery, or do anything that needs mental alertness until you know how this medicine affects you. Alcohol can increase possible drowsiness and dizziness. Avoid  alcoholic drinks. Your mouth may get dry. Chewing sugarless gum or sucking hard candy, and drinking plenty of water may help. Contact your doctor if the problem does not go away or is severe. Drinking plenty of water can also help prevent dehydration that can occur with diarrhea. What side effects may I notice from receiving this medicine? Side effects that you should report to your doctor or health care professional as soon as possible:  allergic reactions like skin rash, itching or hives, swelling of the face, lips, or tongue  bloated, swollen feeling  breathing problems  changes in vision  fast, irregular heartbeat  stomach pain Side effects that usually do not require medical attention (report to your doctor or health care professional if they continue or are bothersome):  headache  loss of appetite  mood changes  nausea, vomiting  numbness or tingling in the hands and feet This list may not describe all possible side effects. Call your doctor for medical advice about side effects. You may report side effects to FDA at 1-800-FDA-1088. Where should I keep my medicine? Keep out of the reach of children. This medicine can be abused. Keep your medicine in a safe place to protect it from theft. Do not share this medicine with anyone. Selling or giving away this medicine is dangerous and against the law. This medicine may cause accidental overdose and death if taken by other adults, children, or pets. Mix any unused medicine with a substance like cat litter or coffee grounds. Then throw the medicine away in a sealed container like a sealed bag or a coffee can with a lid. Do not use the medicine after the expiration date. Store at room temperature between 15 and 30 degrees C (59 and 86 degrees F). Protect from light. Keep container tightly closed. NOTE: This sheet is a summary. It may not cover all possible information. If you have questions about this medicine, talk to your doctor,  pharmacist, or health care provider.  2020 Elsevier/Gold Standard (2013-12-08 16:08:12)  Ondansetron injection What is this medicine? ONDANSETRON (on DAN se tron) is used to treat nausea and vomiting caused by chemotherapy. It is also used to prevent or treat nausea and vomiting after surgery. This medicine may be used for other purposes; ask your health care provider or pharmacist if you have questions. COMMON BRAND NAME(S): Zofran What should I tell my health care provider before I take this medicine? They need to know if you have any of these conditions: heart disease history of irregular heartbeat liver disease low levels of magnesium or potassium in the blood an unusual or allergic reaction to ondansetron, granisetron, other medicines, foods, dyes, or preservatives pregnant or trying to get pregnant breast-feeding How should I use this medicine? This medicine is for infusion into a vein. It is given by a health care professional in a hospital or clinic setting. Talk to your pediatrician regarding  the use of this medicine in children. Special care may be needed. Overdosage: If you think you have taken too much of this medicine contact a poison control center or emergency room at once. NOTE: This medicine is only for you. Do not share this medicine with others. What if I miss a dose? This does not apply. What may interact with this medicine? Do not take this medicine with any of the following medications: apomorphine certain medicines for fungal infections like fluconazole, itraconazole, ketoconazole, posaconazole, voriconazole cisapride dronedarone pimozide thioridazine This medicine may also interact with the following medications: carbamazepine certain medicines for depression, anxiety, or psychotic disturbances fentanyl linezolid MAOIs like Carbex, Eldepryl, Marplan, Nardil, and Parnate methylene blue (injected into a vein) other medicines that prolong the QT interval  (cause an abnormal heart rhythm) like dofetilide, ziprasidone phenytoin rifampicin tramadol This list may not describe all possible interactions. Give your health care provider a list of all the medicines, herbs, non-prescription drugs, or dietary supplements you use. Also tell them if you smoke, drink alcohol, or use illegal drugs. Some items may interact with your medicine. What should I watch for while using this medicine? Your condition will be monitored carefully while you are receiving this medicine. What side effects may I notice from receiving this medicine? Side effects that you should report to your doctor or health care professional as soon as possible: allergic reactions like skin rash, itching or hives, swelling of the face, lips, or tongue breathing problems confusion dizziness fast or irregular heartbeat feeling faint or lightheaded, falls fever and chills loss of balance or coordination seizures sweating swelling of the hands and feet tightness in the chest tremors unusually weak or tired Side effects that usually do not require medical attention (report to your doctor or health care professional if they continue or are bothersome): constipation or diarrhea headache This list may not describe all possible side effects. Call your doctor for medical advice about side effects. You may report side effects to FDA at 1-800-FDA-1088. Where should I keep my medicine? This drug is given in a hospital or clinic and will not be stored at home. NOTE: This sheet is a summary. It may not cover all possible information. If you have questions about this medicine, talk to your doctor, pharmacist, or health care provider.  2020 Elsevier/Gold Standard (2018-03-11 07:12:42)

## 2019-10-26 NOTE — Progress Notes (Signed)
Patient is having lots of diarrhea.  This is not well controlled with Imodium. I sent a new prescription for Lomotil today.

## 2019-10-26 NOTE — Progress Notes (Signed)
[facility] Progress Note   CLARISSIA Brewer [id number] 11-Jul-1965 54 y.o.  [name] is managed by Dr. Nicholas Lose  Actively treated with chemotherapy/immunotherapy/hormonal therapy: yes  Current therapy: Carboplatin, docetaxel, epratuzumab, Ogivri with Fulphila support.  Last treated: 10/14/2019 (cycle 3, day 1)  Next scheduled appointment with provider: 11/04/2019  Assessment: Plan:    Malignant neoplasm of upper-outer quadrant of right breast in female, estrogen receptor positive (HCC)  Diarrhea, unspecified type - Plan: 0.9 %  sodium chloride infusion, diphenoxylate-atropine (LOMOTIL) 2.5-0.025 MG per tablet 1 tablet, diphenoxylate-atropine (LOMOTIL) 2.5-0.025 MG per tablet  Anorexia  Non-intractable vomiting with nausea, unspecified vomiting type - Plan: 0.9 %  sodium chloride infusion, ondansetron (ZOFRAN) injection 8 mg, ondansetron (ZOFRAN) 4 MG/2ML injection  Hypokalemia - Plan: potassium chloride SA (KLOR-CON) CR tablet 40 mEq, potassium chloride SA (KLOR-CON) 20 MEQ tablet, potassium chloride SA (KLOR-CON) 20 MEQ CR tablet   ER positive malignant neoplasm of the right breast: Patient continues to be managed by Dr. Nicholas Lose and is status post cycle 3, day 1 of carboplatin, docetaxel, epratuzumab, Ogivri with Fulphila support which was dosed on 10/14/2019.  She is scheduled to be seen in follow-up on 11/04/2019.  Diarrhea: Patient was given a prescription for Lomotil.  Anorexia: Patient was given 1 L of normal saline IV.  Nausea and vomiting: The patient was given Zofran 8 mg IV today.  She was also given a prescription for Zofran for use at home was told that she could use her Ativan sublingually for nausea 3 times daily as needed.  Hypokalemia.  The patient's labs returned today showing a potassium of 3.0.  She was given potassium 40 mEq p.o. x1 today and was given a prescription for potassium 20 mEq once daily x10 days.  Her labs will be  rechecked on her return.  Please see After Visit Summary for patient specific instructions.  Future Appointments  Date Time Provider [place]  11/04/2019  7:30 AM [facility] LAB 3 [facility] None  11/04/2019  7:45 AM [facility] INFUSION [facility] None  11/04/2019  8:15 AM Nicholas Lose, MD [facility] None  11/04/2019  8:30 AM Gwyndolyn Kaufman, RN [facility] None  11/04/2019  9:00 AM [facility] INFUSION [facility] None  11/06/2019  9:00 AM [facility] [place] FLUSH [facility] None  11/25/2019  7:45 AM [facility] LAB 3 [facility] None  11/25/2019  8:00 AM [facility] [place] FLUSH [facility] None  11/25/2019  8:15 AM Gwyndolyn Kaufman, RN [facility] None  11/25/2019  8:30 AM Gardenia Phlegm, NP [facility] None  11/25/2019  9:00 AM [facility] INFUSION [facility] None  11/27/2019  9:00 AM [facility] [place] FLUSH [facility] None  12/16/2019  7:45 AM [facility] LAB 4 [facility] None  12/16/2019  8:00 AM [facility] [place] FLUSH [facility] None  12/16/2019  8:30 AM Nicholas Lose, MD [facility] None  12/16/2019  9:00 AM [facility] INFUSION [facility] None  12/18/2019  9:00 AM [facility] [place] FLUSH [facility] None    No orders of the defined types were placed in this encounter.      Subjective:   Patient ID:  Stephanie Brewer is a 53 y.o. (DOB 04-19-1965) female.  Chief Complaint: No chief complaint on file.   HPI Stephanie Brewer  is a 54 y.o. female with a diagnosis of an ER positive malignant neoplasm of the right breast.  She is followed by Dr. Nicholas Lose and is status post cycle 3, day 1 of carboplatin, docetaxel, epratuzumab, Ogivri with Fulphila support which was dosed  on 10/14/2019.  She presents to the clinic today with anorexia, nausea, vomiting, and diarrhea despite her use of Imodium.  She has been having 8-12 bowel movements daily.  She had vomiting last week.  She has noted changes in the way food taste.  She reports that she had not had any of these symptoms prior to her last cycle  of chemotherapy.   Medications: I have reviewed the patient's current medications.  Allergies:  Allergies  Allergen Reactions  . Hyoscyamine Anaphylaxis  . Caffeine     Rapid heart rate.  . Doxycycline     Racing heart.  Marland Kitchen Nexium [Esomeprazole]     Swollen throat    Past Medical History:  Diagnosis Date  . DVT (deep venous thrombosis) (HCC)    left leg knee and ankle     Past Surgical History:  Procedure Laterality Date  . BREAST SURGERY     Augmentation 2003  . HERNIA REPAIR  2002  . PORTACATH PLACEMENT N/A 09/01/2019   Procedure: INSERTION PORT-A-CATH WITH ULTRASOUND GUIDANCE;  Surgeon: Rolm Bookbinder, MD;  Location: [facility];  Service: General;  Laterality: N/A;    Family History  Problem Relation Age of Onset  . Hypertension Father     Social History   Socioeconomic History  . Marital status: Married    Spouse name: Not on file  . Number of children: 2  . Years of education: some college  . Highest education level: Not on file  Occupational History  . Occupation: [employer]ist, newspaper  Tobacco Use  . Smoking status: Never Smoker  . Smokeless tobacco: Never Used  Vaping Use  . Vaping Use: Never used  Substance and Sexual Activity  . Alcohol use: Yes    Comment: rare-twice monthly or less  . Drug use: No  . Sexual activity: Yes    Birth control/protection: I.U.D.  Other Topics Concern  . Not on file  Social History Narrative   Lives with spouse   Caffeine use: no caffeine    Left handed    Social Determinants of Health   Financial Resource Strain:   . Difficulty of Paying Living Expenses:   Food Insecurity:   . Worried About Charity fundraiser in the Last Year:   . Arboriculturist in the Last Year:   Transportation Needs:   . Film/video editor (Medical):   Marland Kitchen Lack of Transportation (Non-Medical):   Physical Activity:   . Days of Exercise per Week:   . Minutes of Exercise per Session:   Stress:   . Feeling of Stress :     Social Connections:   . Frequency of Communication with Friends and Family:   . Frequency of Social Gatherings with Friends and Family:   . Attends Religious Services:   . Active Member of Clubs or Organizations:   . Attends Archivist Meetings:   Marland Kitchen Marital Status:   Intimate Partner Violence:   . Fear of Current or Ex-Partner:   . Emotionally Abused:   Marland Kitchen Physically Abused:   . Sexually Abused:     Past Medical History, Surgical history, Social history, and Family history were reviewed and updated as appropriate.   Please see review of systems for further details on the patient's review from today.   Review of Systems:  Review of Systems  Constitutional: Positive for appetite change. Negative for chills, diaphoresis and fever.  HENT: Negative for trouble swallowing.   Respiratory: Negative for cough,  chest tightness and shortness of breath.   Cardiovascular: Negative for chest pain, palpitations and leg swelling.  Gastrointestinal: Positive for diarrhea, nausea and vomiting. Negative for abdominal distention, abdominal pain, blood in stool and constipation.  Genitourinary: Negative for decreased urine volume and difficulty urinating.  Neurological: Negative for weakness.    Objective:   Physical Exam:  BP (!) 135/88 (BP Location: Left Arm, Patient Position: Sitting)   Pulse 98   Temp 99 F (37.2 C) (Temporal)   Resp 17   Ht 5\' 4"  (1.626 m)   Wt 173 lb 9.6 oz (78.7 kg)   LMP 05/03/2017   SpO2 98%   BMI 29.80 kg/m  ECOG: 1  Physical Exam Constitutional:      General: She is not in acute distress.    Appearance: She is not diaphoretic.  HENT:     Head: Normocephalic and atraumatic.     Mouth/Throat:     Mouth: Mucous membranes are moist.     Pharynx: Oropharynx is clear. No oropharyngeal exudate or posterior oropharyngeal erythema.  Eyes:     General: No scleral icterus.       Right eye: No discharge.        Left eye: No discharge.      Conjunctiva/sclera: Conjunctivae normal.  Cardiovascular:     Rate and Rhythm: Normal rate and regular rhythm.     Heart sounds: Normal heart sounds. No murmur heard.  No friction rub. No gallop.   Pulmonary:     Effort: Pulmonary effort is normal. No respiratory distress.     Breath sounds: Normal breath sounds. No wheezing or rales.  Abdominal:     General: Bowel sounds are normal. There is no distension.     Tenderness: There is no abdominal tenderness. There is no guarding or rebound.  Skin:    General: Skin is warm and dry.     Findings: No erythema or rash.  Neurological:     Mental Status: She is alert.     Coordination: Coordination normal.     Gait: Gait normal.  Psychiatric:        Mood and Affect: Mood normal.        Behavior: Behavior normal.        Thought Content: Thought content normal.        Judgment: Judgment normal.     Lab Review:     Component Value Date/Time   NA 138 10/26/2019 1228   NA 140 07/20/2019 1405   K 3.0 (LL) 10/26/2019 1228   CL 100 10/26/2019 1228   CO2 24 10/26/2019 1228   GLUCOSE 124 (H) 10/26/2019 1228   BUN 5 (L) 10/26/2019 1228   BUN 10 07/20/2019 1405   CREATININE 0.81 10/26/2019 1228   CALCIUM 9.6 10/26/2019 1228   PROT 7.1 10/26/2019 1228   PROT 7.4 07/20/2019 1405   ALBUMIN 3.9 10/26/2019 1228   ALBUMIN 4.4 07/20/2019 1405   AST 43 (H) 10/26/2019 1228   ALT 68 (H) 10/26/2019 1228   ALKPHOS 127 (H) 10/26/2019 1228   BILITOT 0.4 10/26/2019 1228   GFRNONAA >60 10/26/2019 1228   GFRAA >60 10/26/2019 1228       Component Value Date/Time   WBC 8.8 10/26/2019 1228   RBC 4.04 10/26/2019 1228   HGB 11.9 (L) 10/26/2019 1228   HGB 13.5 07/20/2019 1405   HCT 35.5 (L) 10/26/2019 1228   HCT 40.5 07/20/2019 1405   PLT 207 10/26/2019 1228   PLT 365 07/20/2019 1405  MCV 87.9 10/26/2019 1228   MCV 84 07/20/2019 1405   MCH 29.5 10/26/2019 1228   MCHC 33.5 10/26/2019 1228   RDW 16.9 (H) 10/26/2019 1228   RDW 12.9 07/20/2019  1405   LYMPHSABS 1.7 10/26/2019 1228   LYMPHSABS 1.2 12/24/2017 0843   MONOABS 0.8 10/26/2019 1228   EOSABS 0.0 10/26/2019 1228   EOSABS 0.2 12/24/2017 0843   BASOSABS 0.1 10/26/2019 1228   BASOSABS 0.1 12/24/2017 0843   -------------------------------  Imaging from last 24 hours (if applicable):  Radiology interpretation: No results found.

## 2019-11-03 NOTE — Progress Notes (Signed)
Patient Care Team: [name], DO as PCP - General (Family Medicine) Mauro Kaufmann, RN as Oncology Nurse Navigator Rockwell Germany, RN as Oncology Nurse Navigator Gwyndolyn Kaufman, RN as Registered Nurse  DIAGNOSIS:    ICD-10-CM   1. Malignant neoplasm of upper-outer quadrant of right breast in female, estrogen receptor positive ([place])  C50.411    Z17.0     SUMMARY OF ONCOLOGIC HISTORY: Oncology History  Malignant neoplasm of upper-outer quadrant of right breast in female, estrogen receptor positive ([place])  07/28/2019 Initial Diagnosis   Screening mammogram detected right breast mass 11:30 position subareolar 1.3 cm, indeterminate 5 mm mass: Biopsy fibroadenoma, right axillary lymph node present, biopsy of the lymph node and the mass revealed grade 2 IDC ER 10%, PR 0%, Ki-67 45%, HER-2 +3+ by [facility]   08/20/2019 Cancer Staging   Staging form: Breast, AJCC 8th Edition - Clinical stage from 08/20/2019: Stage IIA (cT1c, cN1, cM0, G2, ER+, PR-, HER2+) - Signed by Nicholas Lose, MD on 08/20/2019   09/02/2019 -  Chemotherapy   The patient had dexamethasone (DECADRON) 4 MG tablet, 4 mg (100 % of original dose 4 mg), Oral, Daily, 1 of 1 cycle, Start date: 08/20/2019, End date: -- Dose modification: 4 mg (original dose 4 mg, Cycle 0) palonosetron (ALOXI) injection 0.25 mg, 0.25 mg, Intravenous,  Once, 3 of 6 cycles Administration: 0.25 mg (09/02/2019), 0.25 mg (09/23/2019), 0.25 mg (10/14/2019) pegfilgrastim-jmdb (FULPHILA) injection 6 mg, 6 mg, Subcutaneous,  Once, 3 of 6 cycles Administration: 6 mg (09/04/2019), 6 mg (09/25/2019), 6 mg (10/16/2019) CARBOplatin (PARAPLATIN) 700 mg in sodium chloride 0.9 % 250 mL chemo infusion, 700 mg (100 % of original dose 700 mg), Intravenous,  Once, 3 of 6 cycles Dose modification: 700 mg (original dose 700 mg, Cycle 1) Administration: 700 mg (09/02/2019), 700 mg (09/23/2019), 700 mg (10/14/2019) DOCEtaxel (TAXOTERE) 150 mg in sodium chloride 0.9 % 250 mL  chemo infusion, 75 mg/m2 = 150 mg, Intravenous,  Once, 3 of 6 cycles Administration: 150 mg (09/02/2019), 150 mg (09/23/2019), 150 mg (10/14/2019) fosaprepitant (EMEND) 150 mg in sodium chloride 0.9 % 145 mL IVPB, 150 mg, Intravenous,  Once, 3 of 6 cycles Administration: 150 mg (09/02/2019), 150 mg (09/23/2019), 150 mg (10/14/2019) pertuzumab (PERJETA) 420 mg in sodium chloride 0.9 % 250 mL chemo infusion, 420 mg (100 % of original dose 420 mg), Intravenous, Once, 3 of 6 cycles Dose modification: 420 mg (original dose 420 mg, Cycle 1, Reason: Provider Judgment) Administration: 420 mg (09/02/2019), 420 mg (09/23/2019), 420 mg (10/14/2019) trastuzumab-dkst (OGIVRI) 672 mg in sodium chloride 0.9 % 250 mL chemo infusion, 8 mg/kg = 672 mg, Intravenous,  Once, 3 of 6 cycles Administration: 672 mg (09/02/2019), 504 mg (09/23/2019), 504 mg (10/14/2019)  for chemotherapy treatment.      CHIEF COMPLIANT: Cycle 4TCH Perjeta  INTERVAL HISTORY: Stephanie Brewer is a 54 y.o. with above-mentioned history of right breast cancer currently on neoadjuvant chemotherapy with [place].She presents to the clinic todayfora toxicity check andcycle 4.  She has been having severe diarrhea in spite of Imodium.  We prescribed her Lomotil.  She also required IV fluids.  This is secondary to Perjeta.  She also had nausea for which she required IV nausea medication. She developed profound diarrhea along with nausea and vomiting that lasted 4 to 5 days with the last cycle of chemo.  She had to come in and get some IV fluids.  Since the fluids her legs started to  swell up.  She also has noticed maculopapular rash on the chest.  ALLERGIES:  is allergic to hyoscyamine, caffeine, doxycycline, and nexium [esomeprazole].  MEDICATIONS:  Current Outpatient Medications  Medication Sig Dispense Refill  . dexamethasone (DECADRON) 4 MG tablet Take 1 tablet (4 mg total) by mouth daily. Take 1 tablet day before chemo and 1 tablet day after chemo  with food 12 tablet 0  . diphenoxylate-atropine (LOMOTIL) 2.5-0.025 MG tablet Take 1 tablet by mouth 4 (four) times daily as needed for diarrhea or loose stools. 30 tablet 3  . lidocaine-prilocaine (EMLA) cream Apply to affected area once 30 g 3  . LORazepam (ATIVAN) 0.5 MG tablet Take 1 tablet (0.5 mg total) by mouth at bedtime as needed for sleep. 30 tablet 0  . Minocycline HCl 90 MG TB24 Take 1 tablet by mouth daily. Only takes prn for break outs    . ondansetron (ZOFRAN) 8 MG tablet Take 1 tablet (8 mg total) by mouth 2 (two) times daily as needed (Nausea or vomiting). Begin 4 days after chemotherapy. 30 tablet 1  . potassium chloride SA (KLOR-CON) 20 MEQ tablet Take 1 tablet (20 mEq total) by mouth daily. 10 tablet 0  . prochlorperazine (COMPAZINE) 10 MG tablet Take 1 tablet (10 mg total) by mouth every 6 (six) hours as needed (Nausea or vomiting). 30 tablet 1  . rivaroxaban (XARELTO) 20 MG TABS tablet Take 1 tablet (20 mg total) by mouth daily with supper. x5 months. Then stop. 30 tablet 4  . saccharomyces boulardii (FLORASTOR) 250 MG capsule Take 250 mg by mouth daily.     No current facility-administered medications for this visit.   Facility-Administered Medications Ordered in Other Visits  Medication Dose Route Frequency Provider Last Rate Last Admin  . sodium chloride flush (NS) 0.9 % injection 10 mL  10 mL Intravenous PRN Nicholas Lose, MD   10 mL at 09/02/19 0839    PHYSICAL EXAMINATION: ECOG PERFORMANCE STATUS: 1 - Symptomatic but completely ambulatory  Vitals:   11/04/19 0821  BP: 132/82  Pulse: 75  Resp: 18  Temp: 98.3 F (36.8 C)  SpO2: 100%   Filed Weights   11/04/19 0821  Weight: 183 lb 11.2 oz (83.3 kg)    LABORATORY DATA:  I have reviewed the data as listed CMP Latest Ref Rng & Units 10/26/2019 10/14/2019 09/23/2019  Glucose 70 - 99 mg/dL 124(H) 118(H) 100(H)  BUN 6 - 20 mg/dL 5(L) 10 13  Creatinine 0.44 - 1.00 mg/dL 0.81 0.62 0.79  Sodium 135 - 145 mmol/L  138 141 143  Potassium 3.5 - 5.1 mmol/L 3.0(LL) 3.8 3.7  Chloride 98 - 111 mmol/L 100 109 110  CO2 22 - 32 mmol/L 24 24 24   Calcium 8.9 - 10.3 mg/dL 9.6 9.1 8.8(L)  Total Protein 6.5 - 8.1 g/dL 7.1 6.6 6.7  Total Bilirubin 0.3 - 1.2 mg/dL 0.4 0.3 0.3  Alkaline Phos 38 - 126 U/L 127(H) 101 102  AST 15 - 41 U/L 43(H) 25 16  ALT 0 - 44 U/L 68(H) 36 19    Lab Results  Component Value Date   WBC 7.4 11/04/2019   HGB 9.9 (L) 11/04/2019   HCT 30.7 (L) 11/04/2019   MCV 91.1 11/04/2019   PLT 257 11/04/2019   NEUTROABS 4.8 11/04/2019    ASSESSMENT & PLAN:  Malignant neoplasm of upper-outer quadrant of right breast in female, estrogen receptor positive ([place]) 07/28/2019:Screening mammogram detected right breast mass 11:30 position subareolar 1.3 cm, indeterminate 5  mm mass: Biopsy fibroadenoma, right axillary lymph node present, biopsy of the lymph node and the mass revealed grade 2 IDC ER 10%, PR 0%, Ki-67 45%, HER-2 +3+ by IHC T1 cN1 M0 stage IIa  Treatment plan: 1. Neoadjuvant chemotherapy with TCH Perjeta 6 cycles followed by HerceptinPerjeta versus Kadcylamaintenance for 1 year 2. Followed by breast conserving surgery with sentinel lymph node study 3. Followed by adjuvant radiation therapy if patient had lumpectomy  Breast MRI: 3.8 cm tumor and 1 axillary lymphnode. ---------------------------------------------------------------------------------------------------------------------------------------------------- Current treatment:Cycle4day 1TCH Perjeta Echocardiogram EF 60 to 65% 08/25/2019  Chemo toxicities: Worsening fatigue Severe nausea for 4 days: We will reduce the dosage of Taxotere Severe diarrhea: Much better control with Lomotil Chemotherapy-induced anemia: Monitoring Back pain with Neulasta, Claritin daily along with tylenol  Leg edema: I gave her a prescription for Lasix.  She has a previous history of blood clots and is currently on blood  thinners. Hypokalemia: On potassium replacement.  I sent a new prescription for potassium. No peripheral neuropathy  Labs reviewed  RTC in 3 weeks for cycle 5    No orders of the defined types were placed in this encounter.  The patient has a good understanding of the overall plan. she agrees with it. she will call with any problems that may develop before the next visit here.  Total time spent: 30 mins including face to face time and time spent for planning, charting and coordination of care  Nicholas Lose, MD 11/04/2019  I, Cloyde Reams Dorshimer, am acting as scribe for Dr. Nicholas Lose.  I have reviewed the above documentation for accuracy and completeness, and I agree with the above.

## 2019-11-04 ENCOUNTER — Other Ambulatory Visit: Payer: Self-pay

## 2019-11-04 ENCOUNTER — Inpatient Hospital Stay (HOSPITAL_BASED_OUTPATIENT_CLINIC_OR_DEPARTMENT_OTHER): Payer: BC Managed Care – PPO | Admitting: Hematology and Oncology

## 2019-11-04 ENCOUNTER — Inpatient Hospital Stay: Payer: BC Managed Care – PPO

## 2019-11-04 ENCOUNTER — Other Ambulatory Visit: Payer: Self-pay | Admitting: *Deleted

## 2019-11-04 ENCOUNTER — Inpatient Hospital Stay: Payer: BC Managed Care – PPO | Attending: Hematology and Oncology

## 2019-11-04 DIAGNOSIS — R197 Diarrhea, unspecified: Secondary | ICD-10-CM | POA: Insufficient documentation

## 2019-11-04 DIAGNOSIS — Z5111 Encounter for antineoplastic chemotherapy: Secondary | ICD-10-CM | POA: Insufficient documentation

## 2019-11-04 DIAGNOSIS — Z170 Estrogen receptor positive status [ER+]: Secondary | ICD-10-CM | POA: Insufficient documentation

## 2019-11-04 DIAGNOSIS — C50411 Malignant neoplasm of upper-outer quadrant of right female breast: Secondary | ICD-10-CM

## 2019-11-04 DIAGNOSIS — C773 Secondary and unspecified malignant neoplasm of axilla and upper limb lymph nodes: Secondary | ICD-10-CM | POA: Insufficient documentation

## 2019-11-04 DIAGNOSIS — R112 Nausea with vomiting, unspecified: Secondary | ICD-10-CM | POA: Diagnosis not present

## 2019-11-04 DIAGNOSIS — R05 Cough: Secondary | ICD-10-CM | POA: Diagnosis not present

## 2019-11-04 DIAGNOSIS — Z5112 Encounter for antineoplastic immunotherapy: Secondary | ICD-10-CM | POA: Insufficient documentation

## 2019-11-04 DIAGNOSIS — E876 Hypokalemia: Secondary | ICD-10-CM | POA: Insufficient documentation

## 2019-11-04 DIAGNOSIS — Z5189 Encounter for other specified aftercare: Secondary | ICD-10-CM | POA: Insufficient documentation

## 2019-11-04 DIAGNOSIS — Z95828 Presence of other vascular implants and grafts: Secondary | ICD-10-CM

## 2019-11-04 LAB — CBC WITH DIFFERENTIAL (CANCER CENTER ONLY)
Abs Immature Granulocytes: 0.04 10*3/uL (ref 0.00–0.07)
Basophils Absolute: 0.1 10*3/uL (ref 0.0–0.1)
Basophils Relative: 1 %
Eosinophils Absolute: 0 10*3/uL (ref 0.0–0.5)
Eosinophils Relative: 0 %
HCT: 30.7 % — ABNORMAL LOW (ref 36.0–46.0)
Hemoglobin: 9.9 g/dL — ABNORMAL LOW (ref 12.0–15.0)
Immature Granulocytes: 1 %
Lymphocytes Relative: 21 %
Lymphs Abs: 1.5 10*3/uL (ref 0.7–4.0)
MCH: 29.4 pg (ref 26.0–34.0)
MCHC: 32.2 g/dL (ref 30.0–36.0)
MCV: 91.1 fL (ref 80.0–100.0)
Monocytes Absolute: 1 10*3/uL (ref 0.1–1.0)
Monocytes Relative: 13 %
Neutro Abs: 4.8 10*3/uL (ref 1.7–7.7)
Neutrophils Relative %: 64 %
Platelet Count: 257 10*3/uL (ref 150–400)
RBC: 3.37 MIL/uL — ABNORMAL LOW (ref 3.87–5.11)
RDW: 18.2 % — ABNORMAL HIGH (ref 11.5–15.5)
WBC Count: 7.4 10*3/uL (ref 4.0–10.5)
nRBC: 0 % (ref 0.0–0.2)

## 2019-11-04 LAB — CMP (CANCER CENTER ONLY)
ALT: 38 U/L (ref 0–44)
AST: 35 U/L (ref 15–41)
Albumin: 3.4 g/dL — ABNORMAL LOW (ref 3.5–5.0)
Alkaline Phosphatase: 96 U/L (ref 38–126)
Anion gap: 6 (ref 5–15)
BUN: 10 mg/dL (ref 6–20)
CO2: 25 mmol/L (ref 22–32)
Calcium: 9.3 mg/dL (ref 8.9–10.3)
Chloride: 109 mmol/L (ref 98–111)
Creatinine: 0.66 mg/dL (ref 0.44–1.00)
GFR, Est AFR Am: >60 mL/min (ref >60)
GFR, Estimated: >60 mL/min (ref >60)
Glucose, Bld: 113 mg/dL — ABNORMAL HIGH (ref 70–99)
Potassium: 3.9 mmol/L (ref 3.5–5.1)
Sodium: 140 mmol/L (ref 135–145)
Total Bilirubin: 0.3 mg/dL (ref 0.3–1.2)
Total Protein: 6.2 g/dL — ABNORMAL LOW (ref 6.5–8.1)

## 2019-11-04 MED ORDER — HEPARIN SOD (PORK) LOCK FLUSH 100 UNIT/ML IV SOLN
500.0000 [IU] | Freq: Once | INTRAVENOUS | Status: AC | PRN
  Administered 2019-11-04: 500 [IU]
  Filled 2019-11-04: qty 5

## 2019-11-04 MED ORDER — PALONOSETRON HCL INJECTION 0.25 MG/5ML
0.2500 mg | Freq: Once | INTRAVENOUS | Status: AC
  Administered 2019-11-04: 0.25 mg via INTRAVENOUS

## 2019-11-04 MED ORDER — TRASTUZUMAB-DKST CHEMO 150 MG IV SOLR
6.0000 mg/kg | Freq: Once | INTRAVENOUS | Status: AC
  Administered 2019-11-04: 504 mg via INTRAVENOUS
  Filled 2019-11-04: qty 24

## 2019-11-04 MED ORDER — SODIUM CHLORIDE 0.9 % IV SOLN
600.0000 mg | Freq: Once | INTRAVENOUS | Status: AC
  Administered 2019-11-04: 600 mg via INTRAVENOUS
  Filled 2019-11-04: qty 60

## 2019-11-04 MED ORDER — PALONOSETRON HCL INJECTION 0.25 MG/5ML
INTRAVENOUS | Status: AC
  Filled 2019-11-04: qty 5

## 2019-11-04 MED ORDER — ACETAMINOPHEN 325 MG PO TABS
650.0000 mg | ORAL_TABLET | Freq: Once | ORAL | Status: AC
  Administered 2019-11-04: 650 mg via ORAL

## 2019-11-04 MED ORDER — SODIUM CHLORIDE 0.9 % IV SOLN
10.0000 mg | Freq: Once | INTRAVENOUS | Status: AC
  Administered 2019-11-04: 10 mg via INTRAVENOUS
  Filled 2019-11-04: qty 10

## 2019-11-04 MED ORDER — SODIUM CHLORIDE 0.9% FLUSH
10.0000 mL | Freq: Once | INTRAVENOUS | Status: AC
  Administered 2019-11-04: 10 mL
  Filled 2019-11-04: qty 10

## 2019-11-04 MED ORDER — SODIUM CHLORIDE 0.9% FLUSH
10.0000 mL | INTRAVENOUS | Status: DC | PRN
  Administered 2019-11-04: 10 mL
  Filled 2019-11-04: qty 10

## 2019-11-04 MED ORDER — ACETAMINOPHEN 325 MG PO TABS
ORAL_TABLET | ORAL | Status: AC
  Filled 2019-11-04: qty 2

## 2019-11-04 MED ORDER — POTASSIUM CHLORIDE CRYS ER 20 MEQ PO TBCR: 20.0000 meq | EXTENDED_RELEASE_TABLET | Freq: Every day | ORAL | 0 refills | Status: DC

## 2019-11-04 MED ORDER — SODIUM CHLORIDE 0.9 % IV SOLN
150.0000 mg | Freq: Once | INTRAVENOUS | Status: AC
  Administered 2019-11-04: 150 mg via INTRAVENOUS
  Filled 2019-11-04: qty 150

## 2019-11-04 MED ORDER — DIPHENHYDRAMINE HCL 25 MG PO CAPS
50.0000 mg | ORAL_CAPSULE | Freq: Once | ORAL | Status: AC
  Administered 2019-11-04: 50 mg via ORAL

## 2019-11-04 MED ORDER — SODIUM CHLORIDE 0.9 % IV SOLN
420.0000 mg | Freq: Once | INTRAVENOUS | Status: AC
  Administered 2019-11-04: 420 mg via INTRAVENOUS
  Filled 2019-11-04: qty 14

## 2019-11-04 MED ORDER — MAGIC MOUTHWASH W/LIDOCAINE
5.0000 mL | Freq: Four times a day (QID) | ORAL | 0 refills | Status: DC | PRN
Start: 2019-11-04 — End: 2020-04-18

## 2019-11-04 MED ORDER — DIPHENHYDRAMINE HCL 25 MG PO CAPS
ORAL_CAPSULE | ORAL | Status: AC
  Filled 2019-11-04: qty 2

## 2019-11-04 MED ORDER — FUROSEMIDE 20 MG PO TABS
20.0000 mg | ORAL_TABLET | Freq: Every day | ORAL | 0 refills | Status: DC
Start: 2019-11-04 — End: 2019-11-25

## 2019-11-04 MED ORDER — SODIUM CHLORIDE 0.9 % IV SOLN
50.0000 mg/m2 | Freq: Once | INTRAVENOUS | Status: AC
  Administered 2019-11-04: 100 mg via INTRAVENOUS
  Filled 2019-11-04: qty 10

## 2019-11-04 MED ORDER — SODIUM CHLORIDE 0.9 % IV SOLN
Freq: Once | INTRAVENOUS | Status: AC
  Filled 2019-11-04: qty 250

## 2019-11-04 MED ORDER — SODIUM CHLORIDE 0.9 % IV SOLN: 700.0000 mg | Freq: Once | INTRAVENOUS | Status: DC

## 2019-11-04 NOTE — Research (Signed)
[id number] - STUDYING HOW CANCER TREATMENT AFFECTS THE NERVES IN YOUR HANDS AND FEET   10/14/2019 0745AM   Stephanie Brewer arrives today to begin chemotherapy cycle 4 and complete her 8-week S1714 visit; she is unaccompanied today.  PROs: Stephanie Brewer completes the 8-week PROs prior to routine lab collection (no research labs); they are checked for completeness and there are no unanswered questions.  LABS: There are no research labs scheduled for today since Stephanie Brewer did not consent to optional lab specimens.  PROVIDER VISIT: [name] completed and signed the solicited neuropathy event form as well as the follow-up physician assessment form for toxicity burden with a score of 4. Stephanie Brewer does verbalize mild, transient "tingling" in the tips of her fingers and feet but currently has no symptoms. She states the tingling is very brief and does not last; she has no symptoms today or over the past week (last symptoms were 2-3 days after her last chemotherapy cycle).   NEUROPEN/TUNING FORK: Following her provider visit, the neuropen and tuning fork assessments are completed without difficulty. All lower extremity and upper extremity vibration sites are tested on her dominant side (left) per protocol with the assistance of clinical research nurse Stephanie Brewer as a scribe and [employer] forces operational officer.  MEDICATIONS: Stephanie Brewer reviews her current med list which is accurate with the addition of two new medications (listed below) following a symptom management appointment on 10/26/2019 for N/V/D and electrolyte imbalance: - diphenoxylate-atropine (Lomotil) 2.5mg /2.5% one tablet PO QID PRN diarrhea - potassium chloride extended release (Klor-Con) 61mEq one tablet PO QD  SUPPLEMENTS/TOPICAL AGENTS/ALTERNATIVE MEDICINES: Stephanie Brewer denies taking any vitamins or supplements; she is currently not using any topical agents. The only complementary therapy used are ice packs to the hands and feet during chemotherapy administration to prevent neuropathy symptoms (she  currently has no symptoms).   Upon completion of the S1714 assessments, Stephanie Brewer is thanked for her time and continued participation in the S1714 study. The current plan is to see Stephanie Brewer for her 12-week visit at the beginning of cycle 5 on 11/25/2019. Stephanie Brewer is encouraged to call me for any needs or questions: she verbalizes understanding.   TREATMENT: For her cycle 4 treatment today, Stephanie Brewer's taxane dose has been reduced to 50mg /m2 due to profound vomiting and diarrhea following her last treatment. This dosage reduction will be reflected on her [phone] treatment form. Her cumulative taxane dose at the time of modification was 450mg : 150mg  (cycle 1) + 150mg  (cycle 2) + 150mg  (cycle 3) = 450mg  total. Stephanie Brewer will receive 100mg  total (reflecting the reduced dose of 50mg /m2).  Stephanie Brewer. Stephanie Brewer, BSN, RN, CIC 11/04/2019 9:52 AM

## 2019-11-04 NOTE — Patient Instructions (Signed)
[facility] Discharge Instructions for Patients Receiving Chemotherapy  Today you received the following chemotherapy agents Trastuzumab (OGIVRI), Pertuzumab (PERJETA), Docetaxel (TAXOTERE) & Carboplatin (PARAPLATIN).  To help prevent nausea and vomiting after your treatment, we encourage you to take your nausea medication as prescribed.   If you develop nausea and vomiting that is not controlled by your nausea medication, call the clinic.   BELOW ARE SYMPTOMS THAT SHOULD BE REPORTED IMMEDIATELY:  *FEVER GREATER THAN 100.5 F  *CHILLS WITH OR WITHOUT FEVER  NAUSEA AND VOMITING THAT IS NOT CONTROLLED WITH YOUR NAUSEA MEDICATION  *UNUSUAL SHORTNESS OF BREATH  *UNUSUAL BRUISING OR BLEEDING  TENDERNESS IN MOUTH AND THROAT WITH OR WITHOUT PRESENCE OF ULCERS  *URINARY PROBLEMS  *BOWEL PROBLEMS  UNUSUAL RASH Items with * indicate a potential emergency and should be followed up as soon as possible.  Feel free to call the clinic should you have any questions or concerns. The clinic phone number is [phone].  Please show the CHEMO ALERT CARD at check-in to the Emergency Department and triage nurse.   

## 2019-11-04 NOTE — Progress Notes (Signed)
Dose reduce Carboplatin to 600 mg for CINV per [name]. Carboplatin in highly emetogenic.  Kennith Center, Pharm.D., CPP 11/04/2019@9 :40 AM

## 2019-11-04 NOTE — Assessment & Plan Note (Signed)
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 Neoadjuvant chemotherapy with TCH Perjeta 6 cycles followed by HerceptinPerjeta versus Kadcylamaintenance for 1 year 2. Followed by breast conserving surgery with sentinel lymph node study 3. Followed by adjuvant radiation therapy if patient had lumpectomy  Breast MRI: 3.8 cm tumor and 1 axillary lymphnode. ---------------------------------------------------------------------------------------------------------------------------------------------------- Current treatment:Cycle4day 1TCH Perjeta Echocardiogram EF 60 to 65% 08/25/2019  Chemo toxicities: Worsening fatigue Intermittent nausea Severe diarrhea: will D/C Perjeta Chemotherapy-induced anemia Back pain with Neulasta, Claritin daily along with tylenol  No peripheral neuropathy  Labs reviewed  RTC in 3 weeks for cycle 5 

## 2019-11-05 ENCOUNTER — Telehealth: Payer: Self-pay | Admitting: Hematology and Oncology

## 2019-11-05 NOTE — Telephone Encounter (Signed)
No 8/4 los, no changes made to pt schedule  

## 2019-11-06 ENCOUNTER — Other Ambulatory Visit: Payer: Self-pay

## 2019-11-06 ENCOUNTER — Inpatient Hospital Stay: Payer: BC Managed Care – PPO

## 2019-11-06 VITALS — BP 123/81 | HR 71 | Temp 98.3°F | Resp 18

## 2019-11-06 DIAGNOSIS — H5213 Myopia, bilateral: Secondary | ICD-10-CM | POA: Diagnosis not present

## 2019-11-06 DIAGNOSIS — E876 Hypokalemia: Secondary | ICD-10-CM | POA: Diagnosis not present

## 2019-11-06 DIAGNOSIS — Z5111 Encounter for antineoplastic chemotherapy: Secondary | ICD-10-CM | POA: Diagnosis not present

## 2019-11-06 DIAGNOSIS — H524 Presbyopia: Secondary | ICD-10-CM | POA: Diagnosis not present

## 2019-11-06 DIAGNOSIS — C773 Secondary and unspecified malignant neoplasm of axilla and upper limb lymph nodes: Secondary | ICD-10-CM | POA: Diagnosis not present

## 2019-11-06 DIAGNOSIS — Z5112 Encounter for antineoplastic immunotherapy: Secondary | ICD-10-CM | POA: Diagnosis not present

## 2019-11-06 DIAGNOSIS — R112 Nausea with vomiting, unspecified: Secondary | ICD-10-CM | POA: Diagnosis not present

## 2019-11-06 DIAGNOSIS — Z170 Estrogen receptor positive status [ER+]: Secondary | ICD-10-CM | POA: Diagnosis not present

## 2019-11-06 DIAGNOSIS — C50411 Malignant neoplasm of upper-outer quadrant of right female breast: Secondary | ICD-10-CM

## 2019-11-06 DIAGNOSIS — Z95828 Presence of other vascular implants and grafts: Secondary | ICD-10-CM

## 2019-11-06 DIAGNOSIS — R197 Diarrhea, unspecified: Secondary | ICD-10-CM | POA: Diagnosis not present

## 2019-11-06 DIAGNOSIS — Z5189 Encounter for other specified aftercare: Secondary | ICD-10-CM | POA: Diagnosis not present

## 2019-11-06 DIAGNOSIS — R05 Cough: Secondary | ICD-10-CM | POA: Diagnosis not present

## 2019-11-06 MED ORDER — PEGFILGRASTIM-JMDB 6 MG/0.6ML ~~LOC~~ SOSY
PREFILLED_SYRINGE | SUBCUTANEOUS | Status: AC
  Filled 2019-11-06: qty 0.6

## 2019-11-06 MED ORDER — SODIUM CHLORIDE 0.9% FLUSH
10.0000 mL | Freq: Once | INTRAVENOUS | Status: DC
  Filled 2019-11-06: qty 10

## 2019-11-06 MED ORDER — PEGFILGRASTIM-JMDB 6 MG/0.6ML ~~LOC~~ SOSY
6.0000 mg | PREFILLED_SYRINGE | Freq: Once | SUBCUTANEOUS | Status: AC
  Administered 2019-11-06: 6 mg via SUBCUTANEOUS

## 2019-11-06 NOTE — Patient Instructions (Signed)

## 2019-11-13 ENCOUNTER — Encounter: Payer: BC Managed Care – PPO | Admitting: Medical

## 2019-11-13 ENCOUNTER — Ambulatory Visit: Payer: BC Managed Care – PPO

## 2019-11-13 ENCOUNTER — Other Ambulatory Visit: Payer: BC Managed Care – PPO

## 2019-11-13 ENCOUNTER — Other Ambulatory Visit: Payer: Self-pay

## 2019-11-13 ENCOUNTER — Inpatient Hospital Stay: Payer: BC Managed Care – PPO

## 2019-11-13 ENCOUNTER — Encounter: Payer: Self-pay | Admitting: Medical

## 2019-11-13 ENCOUNTER — Inpatient Hospital Stay (HOSPITAL_BASED_OUTPATIENT_CLINIC_OR_DEPARTMENT_OTHER): Payer: BC Managed Care – PPO | Admitting: Medical

## 2019-11-13 ENCOUNTER — Telehealth: Payer: Self-pay | Admitting: *Deleted

## 2019-11-13 VITALS — BP 127/77 | HR 78 | Temp 98.1°F | Resp 18 | Ht 64.0 in | Wt 173.3 lb

## 2019-11-13 DIAGNOSIS — R197 Diarrhea, unspecified: Secondary | ICD-10-CM

## 2019-11-13 DIAGNOSIS — K625 Hemorrhage of anus and rectum: Secondary | ICD-10-CM

## 2019-11-13 DIAGNOSIS — C773 Secondary and unspecified malignant neoplasm of axilla and upper limb lymph nodes: Secondary | ICD-10-CM | POA: Diagnosis not present

## 2019-11-13 DIAGNOSIS — Z5111 Encounter for antineoplastic chemotherapy: Secondary | ICD-10-CM | POA: Diagnosis not present

## 2019-11-13 DIAGNOSIS — E860 Dehydration: Secondary | ICD-10-CM

## 2019-11-13 DIAGNOSIS — Z5189 Encounter for other specified aftercare: Secondary | ICD-10-CM | POA: Diagnosis not present

## 2019-11-13 DIAGNOSIS — R05 Cough: Secondary | ICD-10-CM | POA: Diagnosis not present

## 2019-11-13 DIAGNOSIS — R112 Nausea with vomiting, unspecified: Secondary | ICD-10-CM | POA: Diagnosis not present

## 2019-11-13 DIAGNOSIS — Z170 Estrogen receptor positive status [ER+]: Secondary | ICD-10-CM

## 2019-11-13 DIAGNOSIS — Z5112 Encounter for antineoplastic immunotherapy: Secondary | ICD-10-CM | POA: Diagnosis not present

## 2019-11-13 DIAGNOSIS — Z95828 Presence of other vascular implants and grafts: Secondary | ICD-10-CM | POA: Diagnosis not present

## 2019-11-13 DIAGNOSIS — C50411 Malignant neoplasm of upper-outer quadrant of right female breast: Secondary | ICD-10-CM

## 2019-11-13 DIAGNOSIS — E876 Hypokalemia: Secondary | ICD-10-CM | POA: Diagnosis not present

## 2019-11-13 LAB — CBC WITH DIFFERENTIAL (CANCER CENTER ONLY)
Abs Immature Granulocytes: 1.61 10*3/uL — ABNORMAL HIGH (ref 0.00–0.07)
Basophils Absolute: 0 10*3/uL (ref 0.0–0.1)
Basophils Relative: 0 %
Eosinophils Absolute: 0 10*3/uL (ref 0.0–0.5)
Eosinophils Relative: 0 %
HCT: 35.3 % — ABNORMAL LOW (ref 36.0–46.0)
Hemoglobin: 11.6 g/dL — ABNORMAL LOW (ref 12.0–15.0)
Immature Granulocytes: 12 %
Lymphocytes Relative: 13 %
Lymphs Abs: 1.8 10*3/uL (ref 0.7–4.0)
MCH: 29.7 pg (ref 26.0–34.0)
MCHC: 32.9 g/dL (ref 30.0–36.0)
MCV: 90.5 fL (ref 80.0–100.0)
Monocytes Absolute: 1.9 10*3/uL — ABNORMAL HIGH (ref 0.1–1.0)
Monocytes Relative: 13 %
Neutro Abs: 8.7 10*3/uL — ABNORMAL HIGH (ref 1.7–7.7)
Neutrophils Relative %: 62 %
Platelet Count: 282 10*3/uL (ref 150–400)
RBC: 3.9 MIL/uL (ref 3.87–5.11)
RDW: 17.2 % — ABNORMAL HIGH (ref 11.5–15.5)
WBC Count: 14.1 10*3/uL — ABNORMAL HIGH (ref 4.0–10.5)
nRBC: 0.3 % — ABNORMAL HIGH (ref 0.0–0.2)

## 2019-11-13 LAB — CMP (CANCER CENTER ONLY)
ALT: 55 U/L — ABNORMAL HIGH (ref 0–44)
AST: 37 U/L (ref 15–41)
Albumin: 3.8 g/dL (ref 3.5–5.0)
Alkaline Phosphatase: 144 U/L — ABNORMAL HIGH (ref 38–126)
Anion gap: 12 (ref 5–15)
BUN: 7 mg/dL (ref 6–20)
CO2: 24 mmol/L (ref 22–32)
Calcium: 9.6 mg/dL (ref 8.9–10.3)
Chloride: 101 mmol/L (ref 98–111)
Creatinine: 0.81 mg/dL (ref 0.44–1.00)
GFR, Est AFR Am: >60 mL/min (ref >60)
GFR, Estimated: >60 mL/min (ref >60)
Glucose, Bld: 100 mg/dL — ABNORMAL HIGH (ref 70–99)
Potassium: 3.2 mmol/L — ABNORMAL LOW (ref 3.5–5.1)
Sodium: 137 mmol/L (ref 135–145)
Total Bilirubin: 0.3 mg/dL (ref 0.3–1.2)
Total Protein: 6.9 g/dL (ref 6.5–8.1)

## 2019-11-13 LAB — MAGNESIUM: Magnesium: 1.7 mg/dL (ref 1.7–2.4)

## 2019-11-13 MED ORDER — DIPHENOXYLATE-ATROPINE 2.5-0.025 MG PO TABS
1.0000 | ORAL_TABLET | Freq: Once | ORAL | Status: AC
  Administered 2019-11-13: 1 via ORAL

## 2019-11-13 MED ORDER — HEPARIN SOD (PORK) LOCK FLUSH 100 UNIT/ML IV SOLN
500.0000 [IU] | Freq: Once | INTRAVENOUS | Status: AC
  Administered 2019-11-13: 500 [IU]
  Filled 2019-11-13: qty 5

## 2019-11-13 MED ORDER — DIPHENOXYLATE-ATROPINE 2.5-0.025 MG PO TABS
ORAL_TABLET | ORAL | Status: AC
  Filled 2019-11-13: qty 1

## 2019-11-13 MED ORDER — SODIUM CHLORIDE 0.9 % IV SOLN
INTRAVENOUS | Status: AC
  Filled 2019-11-13: qty 250

## 2019-11-13 MED ORDER — SODIUM CHLORIDE 0.9% FLUSH
10.0000 mL | Freq: Once | INTRAVENOUS | Status: AC
  Administered 2019-11-13: 10 mL
  Filled 2019-11-13: qty 10

## 2019-11-13 NOTE — Telephone Encounter (Signed)
Received call from pt with complaint of chills with no fever, diarrhea and vomiting.   Pt reports 10 episodes of bloody diarrhea in the last 24 hours without relief from Lomotil.  Per MD pt needing to be evaluated in [facility] today.  Learta Codding, RN notified and apt scheduled.  Pt verbalized understanding of apt date and time.

## 2019-11-13 NOTE — Progress Notes (Signed)
[facility] Progress Note   Stephanie Brewer [id number] 06/24/1965 54 y.o.  Stephanie Brewer is managed by Dr. Nicholas Lose  Actively treated with chemotherapy/immunotherapy/hormonal therapy: yes  Current therapy: Carboplatin, docetaxel, epratuzumab, Ogivri with Fulphila support.  Last treated: 11/04/2019 (cycle 4, day 1)  Next scheduled appointment with provider: 11/25/2019  Assessment: Plan:    Malignant neoplasm of upper-outer quadrant of right breast in female, estrogen receptor positive ([place]) - Plan: heparin lock flush 100 unit/mL, sodium chloride flush (NS) 0.9 % injection 10 mL  Port-A-Cath in place - Plan: heparin lock flush 100 unit/mL, sodium chloride flush (NS) 0.9 % injection 10 mL  Dehydration - Plan: 0.9 %  sodium chloride infusion  Diarrhea, unspecified type - Plan: diphenoxylate-atropine (LOMOTIL) 2.5-0.025 MG per tablet 1 tablet  BRBPR (bright red blood per rectum) - Plan: Occult blood card to lab, stool, Occult blood card to lab, stool, Occult blood card to lab, stool   ER positive malignant neoplasm of the right breast: Stephanie Brewer continues to be managed by Dr. Nicholas Lose and is status post cycle 4, day 1 of carboplatin, docetaxel, epratuzumab, Ogivri with Fulphila support which was dosed on 11/04/2019.  She is scheduled to be seen in follow-up on 08/252021.  Bloody diarrhea:  Stephanie Brewer was given Lomotil 1 tablet P.O. x 1 today. She was given 3 stool cards to complete and return.   Hypokalemia.  The patient's labs returned today showing a potassium of 3.2.  She was instructed to double up on her potassium 20 mEq once daily until her return.  Her labs will be rechecked on her return.  Please see After Visit Summary for patient specific instructions.  Future Appointments  Date Time Provider [place]  11/25/2019  7:45 AM [facility] LAB 3 [facility] None  11/25/2019  8:00 AM [facility] [place] FLUSH [facility] None  11/25/2019  8:15 AM  Gwyndolyn Kaufman, RN [facility] None  11/25/2019  8:30 AM Gardenia Phlegm, NP [facility] None  11/25/2019  9:00 AM [facility] INFUSION [facility] None  11/27/2019  9:00 AM [facility] [place] FLUSH [facility] None  12/16/2019  7:45 AM [facility] LAB 4 [facility] None  12/16/2019  8:00 AM [facility] [place] FLUSH [facility] None  12/16/2019  8:30 AM Nicholas Lose, MD [facility] None  12/16/2019  9:00 AM [facility] INFUSION [facility] None  12/18/2019  9:00 AM [facility] [place] FLUSH [facility] None    Orders Placed This Encounter  Procedures  . Occult blood card to lab, stool  . Occult blood card to lab, stool  . Occult blood card to lab, stool       Subjective:   Patient ID:  Stephanie Brewer is a 44 y.o. (DOB Oct 20, 1965) female.  Chief Complaint:  Chief Complaint  Patient presents with  . Diarrhea  . Rectal Bleeding  . Emesis    HPI Stephanie Brewer  is a 54 y.o. female with a diagnosis of an ER positive malignant neoplasm of the right breast.  She is followed by Dr. Nicholas Lose and is status post cycle 4, day 1 of carboplatin, docetaxel, epratuzumab, Ogivri with Fulphila support which was dosed on 11/04/2019.   [name] called our office this morning reporting that she has had chills with no fever, diarrhea and vomiting.  She has experienced 10 episodes of bloody diarrhea in the last 24 hours without relief from Lomotil.    She reports having diarrhea on Monday which was slightly decreased with her use of Lomotil.  Since  then she has had nausea, vomiting, and an increase in diarrhea.  She has noticed bright red blood when she goes to the bathroom.  She also reports having an episode of a black stool on one occasion yesterday.  She has a nonproductive cough.  She reports having chills but no fevers or sweats.  Medications: I have reviewed the patient's current medications.  Allergies:  Allergies  Allergen Reactions  . Hyoscyamine Anaphylaxis  . Caffeine     Rapid heart rate.  .  Doxycycline     Racing heart.  Marland Kitchen Nexium [Esomeprazole]     Swollen throat    Past Medical History:  Diagnosis Date  . DVT (deep venous thrombosis) (HCC)    left leg knee and ankle     Past Surgical History:  Procedure Laterality Date  . BREAST SURGERY     Augmentation 2003  . HERNIA REPAIR  2002  . PORTACATH PLACEMENT N/A 09/01/2019   Procedure: INSERTION PORT-A-CATH WITH ULTRASOUND GUIDANCE;  Surgeon: Rolm Bookbinder, MD;  Location: [facility];  Service: General;  Laterality: N/A;    Family History  Problem Relation Age of Onset  . Hypertension Father     Social History   Socioeconomic History  . Marital status: Married    Spouse name: Not on file  . Number of children: 2  . Years of education: some college  . Highest education level: Not on file  Occupational History  . 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 Smoking status: Never Smoker  . Smokeless tobacco: Never Used  Vaping Use  . Vaping Use: Never used  Substance and Sexual Activity  . Alcohol use: Yes    Comment: rare-twice monthly or less  . Drug use: No  . Sexual activity: Yes    Birth control/protection: I.U.D.  Other Topics Concern  . Not on file  Social History Narrative   Lives with spouse   Caffeine use: no caffeine    Left handed    Social Determinants of Health   Financial Resource Strain:   . Difficulty of Paying Living Expenses:   Food Insecurity:   . Worried About Charity fundraiser in the Last Year:   . Arboriculturist in the Last Year:   Transportation Needs:   . Film/video editor (Medical):   Marland Kitchen Lack of Transportation (Non-Medical):   Physical Activity:   . Days of Exercise per Week:   . Minutes of Exercise per Session:   Stress:   . Feeling of Stress :   Social Connections:   . Frequency of Communication with Friends and Family:   . Frequency of Social Gatherings with Friends and Family:   . Attends Religious Services:   . Active Member of Clubs  or Organizations:   . Attends Archivist Meetings:   Marland Kitchen Marital Status:   Intimate Partner Violence:   . Fear of Current or Ex-Partner:   . Emotionally Abused:   Marland Kitchen Physically Abused:   . Sexually Abused:     Past Medical History, Surgical history, Social history, and Family history were reviewed and updated as appropriate.   Please see review of systems for further details on the patient's review from today.   Review of Systems:  Review of Systems  Constitutional: Positive for appetite change. Negative for chills, diaphoresis and fever.  HENT: Negative for trouble swallowing.   Respiratory: Negative for cough, chest tightness and shortness of breath.   Cardiovascular: Negative for  chest pain, palpitations and leg swelling.  Gastrointestinal: Positive for blood in stool, diarrhea, nausea and vomiting. Negative for abdominal distention, abdominal pain and constipation.  Genitourinary: Negative for decreased urine volume and difficulty urinating.  Neurological: Negative for weakness.    Objective:   Physical Exam:  BP 127/77   Pulse 78   Temp 98.1 F (36.7 C) (Temporal)   Resp 18   Ht 5\' 4"  (1.626 m)   Wt 173 lb 4.8 oz (78.6 kg)   LMP 05/03/2017   SpO2 97%   BMI 29.75 kg/m  ECOG: 1  Physical Exam Constitutional:      General: She is not in acute distress.    Appearance: She is not diaphoretic.  HENT:     Head: Normocephalic and atraumatic.     Mouth/Throat:     Mouth: Mucous membranes are moist.     Pharynx: Oropharynx is clear. No oropharyngeal exudate or posterior oropharyngeal erythema.  Eyes:     General: No scleral icterus.       Right eye: No discharge.        Left eye: No discharge.     Conjunctiva/sclera: Conjunctivae normal.  Cardiovascular:     Rate and Rhythm: Normal rate and regular rhythm.     Heart sounds: Normal heart sounds. No murmur heard.  No friction rub. No gallop.   Pulmonary:     Effort: Pulmonary effort is normal. No  respiratory distress.     Breath sounds: Normal breath sounds. No wheezing or rales.  Abdominal:     General: Bowel sounds are normal. There is no distension.     Tenderness: There is no abdominal tenderness. There is no guarding or rebound.  Skin:    General: Skin is warm and dry.     Findings: No erythema or rash.  Neurological:     Mental Status: She is alert.     Coordination: Coordination normal.     Gait: Gait normal.  Psychiatric:        Mood and Affect: Mood normal.        Behavior: Behavior normal.        Thought Content: Thought content normal.        Judgment: Judgment normal.     Lab Review:     Component Value Date/Time   NA 137 11/13/2019 1046   NA 140 07/20/2019 1405   K 3.2 (L) 11/13/2019 1046   CL 101 11/13/2019 1046   CO2 24 11/13/2019 1046   GLUCOSE 100 (H) 11/13/2019 1046   BUN 7 11/13/2019 1046   BUN 10 07/20/2019 1405   CREATININE 0.81 11/13/2019 1046   CALCIUM 9.6 11/13/2019 1046   PROT 6.9 11/13/2019 1046   PROT 7.4 07/20/2019 1405   ALBUMIN 3.8 11/13/2019 1046   ALBUMIN 4.4 07/20/2019 1405   AST 37 11/13/2019 1046   ALT 55 (H) 11/13/2019 1046   ALKPHOS 144 (H) 11/13/2019 1046   BILITOT 0.3 11/13/2019 1046   GFRNONAA >60 11/13/2019 1046   GFRAA >60 11/13/2019 1046       Component Value Date/Time   WBC 14.1 (H) 11/13/2019 1046   RBC 3.90 11/13/2019 1046   HGB 11.6 (L) 11/13/2019 1046   HGB 13.5 07/20/2019 1405   HCT 35.3 (L) 11/13/2019 1046   HCT 40.5 07/20/2019 1405   PLT 282 11/13/2019 1046   PLT 365 07/20/2019 1405   MCV 90.5 11/13/2019 1046   MCV 84 07/20/2019 1405   MCH 29.7 11/13/2019 1046  MCHC 32.9 11/13/2019 1046   RDW 17.2 (H) 11/13/2019 1046   RDW 12.9 07/20/2019 1405   LYMPHSABS 1.8 11/13/2019 1046   LYMPHSABS 1.2 12/24/2017 0843   MONOABS 1.9 (H) 11/13/2019 1046   EOSABS 0.0 11/13/2019 1046   EOSABS 0.2 12/24/2017 0843   BASOSABS 0.0 11/13/2019 1046   BASOSABS 0.1 12/24/2017 0843    -------------------------------  Imaging from last 24 hours (if applicable):  Radiology interpretation: No results found.

## 2019-11-13 NOTE — Patient Instructions (Signed)

## 2019-11-16 ENCOUNTER — Encounter: Payer: Self-pay | Admitting: Family Medicine

## 2019-11-23 ENCOUNTER — Other Ambulatory Visit (INDEPENDENT_AMBULATORY_CARE_PROVIDER_SITE_OTHER): Payer: BC Managed Care – PPO | Admitting: Family Medicine

## 2019-11-23 DIAGNOSIS — M5136 Other intervertebral disc degeneration, lumbar region: Secondary | ICD-10-CM

## 2019-11-23 DIAGNOSIS — M5410 Radiculopathy, site unspecified: Secondary | ICD-10-CM

## 2019-11-23 NOTE — Progress Notes (Signed)
NEUROIMAGING REPORT    STUDY DATE: 02/12/2018 PATIENT NAME: Stephanie Brewer DOB: 07/23/1965 MRN: [id number]  EXAM: MRI of the lumbar spine without contrast  ORDERING CLINICIAN: Richard A. Sater, MD. PhD CLINICAL HISTORY: 54 year old woman with left leg pain COMPARISON FILMS: none  TECHNIQUE: MRI of the lumbar spine was obtained utilizing 4 mm sagittal slices from P59-45 down to the lower sacrum with T1, T2 and inversion recovery views. In addition 4 mm axial slices from O5-9 down to L5-S1 level were included with T1 and T2 weighted views. CONTRAST: None IMAGING SITE: [facility], [address] [place], [place] [address]   FINDINGS: On sagittal images, the spine is imaged from T11 to the sacrum.   The conus medullaris and cauda equine appear normal.   The vertebral bodies are normally aligned.   There is mild reduced disc height at L2-L3.  Hemangiomas are seen within the T12, L3 and L4 vertebral bodies..    The discs and interspaces were further evaluated on axial views from L1 to S1 as follows: L1 - L2:  The disc and interspace appear normal. L2 - L3: There is broad disc bulging and mild reduced disc height.  Though there is no significant facet hypertrophy, there are minimal joint effusions.   There is no spinal stenosis, foraminal narrowing or nerve root compression. L3 - L4: There is mild disc bulging.  There is no spinal stenosis, foraminal narrowing or nerve root compression. L4 - L5: There is minimal disc bulging and mild left facet hypertrophy.  There is no spinal stenosis, foraminal narrowing or nerve root compression. L5 - S1: There is a slight disc protrusion to the left.  There is no spinal stenosis, foraminal narrowing or nerve root compression.  IMPRESSION: This MRI of the lumbar spine without contrast shows mild degenerative changes as detailed above that do not lead to any spinal stenosis or nerve root compression.        INTERPRETING  PHYSICIAN:  Richard A. Felecia Shelling, MD, PhD, FAAN Certified in  Neuroimaging by [employer]  Handwritten rx for whirlpool/ hot tub placed up front

## 2019-11-25 ENCOUNTER — Other Ambulatory Visit: Payer: Self-pay

## 2019-11-25 ENCOUNTER — Inpatient Hospital Stay (HOSPITAL_BASED_OUTPATIENT_CLINIC_OR_DEPARTMENT_OTHER): Payer: BC Managed Care – PPO | Admitting: Adult Health

## 2019-11-25 ENCOUNTER — Encounter: Payer: Self-pay | Admitting: Adult Health

## 2019-11-25 ENCOUNTER — Inpatient Hospital Stay: Payer: BC Managed Care – PPO

## 2019-11-25 ENCOUNTER — Encounter: Payer: Self-pay | Admitting: *Deleted

## 2019-11-25 VITALS — BP 123/69 | HR 77 | Temp 98.1°F | Resp 18 | Ht 64.0 in | Wt 180.6 lb

## 2019-11-25 VITALS — BP 99/56 | HR 79 | Temp 97.9°F | Resp 18

## 2019-11-25 DIAGNOSIS — C50411 Malignant neoplasm of upper-outer quadrant of right female breast: Secondary | ICD-10-CM | POA: Diagnosis not present

## 2019-11-25 DIAGNOSIS — Z170 Estrogen receptor positive status [ER+]: Secondary | ICD-10-CM

## 2019-11-25 DIAGNOSIS — Z5112 Encounter for antineoplastic immunotherapy: Secondary | ICD-10-CM | POA: Diagnosis not present

## 2019-11-25 DIAGNOSIS — Z5111 Encounter for antineoplastic chemotherapy: Secondary | ICD-10-CM | POA: Diagnosis not present

## 2019-11-25 DIAGNOSIS — Z95828 Presence of other vascular implants and grafts: Secondary | ICD-10-CM

## 2019-11-25 DIAGNOSIS — R197 Diarrhea, unspecified: Secondary | ICD-10-CM | POA: Diagnosis not present

## 2019-11-25 DIAGNOSIS — E876 Hypokalemia: Secondary | ICD-10-CM | POA: Diagnosis not present

## 2019-11-25 DIAGNOSIS — R112 Nausea with vomiting, unspecified: Secondary | ICD-10-CM | POA: Diagnosis not present

## 2019-11-25 DIAGNOSIS — C773 Secondary and unspecified malignant neoplasm of axilla and upper limb lymph nodes: Secondary | ICD-10-CM | POA: Diagnosis not present

## 2019-11-25 DIAGNOSIS — Z5189 Encounter for other specified aftercare: Secondary | ICD-10-CM | POA: Diagnosis not present

## 2019-11-25 DIAGNOSIS — R05 Cough: Secondary | ICD-10-CM | POA: Diagnosis not present

## 2019-11-25 LAB — CMP (CANCER CENTER ONLY)
ALT: 39 U/L (ref 0–44)
AST: 36 U/L (ref 15–41)
Albumin: 3.3 g/dL — ABNORMAL LOW (ref 3.5–5.0)
Alkaline Phosphatase: 93 U/L (ref 38–126)
Anion gap: 8 (ref 5–15)
BUN: 8 mg/dL (ref 6–20)
CO2: 23 mmol/L (ref 22–32)
Calcium: 9.5 mg/dL (ref 8.9–10.3)
Chloride: 108 mmol/L (ref 98–111)
Creatinine: 0.65 mg/dL (ref 0.44–1.00)
GFR, Est AFR Am: >60 mL/min (ref >60)
GFR, Estimated: >60 mL/min (ref >60)
Glucose, Bld: 119 mg/dL — ABNORMAL HIGH (ref 70–99)
Potassium: 3.7 mmol/L (ref 3.5–5.1)
Sodium: 139 mmol/L (ref 135–145)
Total Bilirubin: 0.4 mg/dL (ref 0.3–1.2)
Total Protein: 6.2 g/dL — ABNORMAL LOW (ref 6.5–8.1)

## 2019-11-25 LAB — CBC WITH DIFFERENTIAL (CANCER CENTER ONLY)
Abs Immature Granulocytes: 0.03 10*3/uL (ref 0.00–0.07)
Basophils Absolute: 0 10*3/uL (ref 0.0–0.1)
Basophils Relative: 1 %
Eosinophils Absolute: 0 10*3/uL (ref 0.0–0.5)
Eosinophils Relative: 0 %
HCT: 29.7 % — ABNORMAL LOW (ref 36.0–46.0)
Hemoglobin: 9.7 g/dL — ABNORMAL LOW (ref 12.0–15.0)
Immature Granulocytes: 1 %
Lymphocytes Relative: 19 %
Lymphs Abs: 1.2 10*3/uL (ref 0.7–4.0)
MCH: 30.3 pg (ref 26.0–34.0)
MCHC: 32.7 g/dL (ref 30.0–36.0)
MCV: 92.8 fL (ref 80.0–100.0)
Monocytes Absolute: 0.7 10*3/uL (ref 0.1–1.0)
Monocytes Relative: 12 %
Neutro Abs: 4.2 10*3/uL (ref 1.7–7.7)
Neutrophils Relative %: 67 %
Platelet Count: 200 10*3/uL (ref 150–400)
RBC: 3.2 MIL/uL — ABNORMAL LOW (ref 3.87–5.11)
RDW: 17.6 % — ABNORMAL HIGH (ref 11.5–15.5)
WBC Count: 6.2 10*3/uL (ref 4.0–10.5)
nRBC: 0 % (ref 0.0–0.2)

## 2019-11-25 MED ORDER — SODIUM CHLORIDE 0.9 % IV SOLN
150.0000 mg | Freq: Once | INTRAVENOUS | Status: AC
  Administered 2019-11-25: 150 mg via INTRAVENOUS
  Filled 2019-11-25: qty 150

## 2019-11-25 MED ORDER — SODIUM CHLORIDE 0.9 % IV SOLN
600.0000 mg | Freq: Once | INTRAVENOUS | Status: AC
  Administered 2019-11-25: 600 mg via INTRAVENOUS
  Filled 2019-11-25: qty 60

## 2019-11-25 MED ORDER — SODIUM CHLORIDE 0.9 % IV SOLN
50.0000 mg/m2 | Freq: Once | INTRAVENOUS | Status: AC
  Administered 2019-11-25: 100 mg via INTRAVENOUS
  Filled 2019-11-25: qty 10

## 2019-11-25 MED ORDER — SODIUM CHLORIDE 0.9 % IV SOLN
10.0000 mg | Freq: Once | INTRAVENOUS | Status: AC
  Administered 2019-11-25: 10 mg via INTRAVENOUS
  Filled 2019-11-25: qty 10

## 2019-11-25 MED ORDER — PALONOSETRON HCL INJECTION 0.25 MG/5ML
0.2500 mg | Freq: Once | INTRAVENOUS | Status: AC
  Administered 2019-11-25: 0.25 mg via INTRAVENOUS

## 2019-11-25 MED ORDER — SODIUM CHLORIDE 0.9 % IV SOLN
Freq: Once | INTRAVENOUS | Status: AC
  Filled 2019-11-25: qty 250

## 2019-11-25 MED ORDER — SODIUM CHLORIDE 0.9% FLUSH
10.0000 mL | Freq: Once | INTRAVENOUS | Status: AC
  Administered 2019-11-25: 10 mL
  Filled 2019-11-25: qty 10

## 2019-11-25 MED ORDER — DIPHENHYDRAMINE HCL 25 MG PO CAPS
ORAL_CAPSULE | ORAL | Status: AC
  Filled 2019-11-25: qty 2

## 2019-11-25 MED ORDER — TRASTUZUMAB-DKST CHEMO 150 MG IV SOLR
6.0000 mg/kg | Freq: Once | INTRAVENOUS | Status: AC
  Administered 2019-11-25: 504 mg via INTRAVENOUS
  Filled 2019-11-25: qty 24

## 2019-11-25 MED ORDER — SODIUM CHLORIDE 0.9% FLUSH
10.0000 mL | INTRAVENOUS | Status: DC | PRN
  Administered 2019-11-25: 10 mL
  Filled 2019-11-25: qty 10

## 2019-11-25 MED ORDER — FUROSEMIDE 20 MG PO TABS: 20.0000 mg | ORAL_TABLET | Freq: Two times a day (BID) | ORAL | 0 refills | Status: DC

## 2019-11-25 MED ORDER — SODIUM CHLORIDE 0.9 % IV SOLN
420.0000 mg | Freq: Once | INTRAVENOUS | Status: AC
  Administered 2019-11-25: 420 mg via INTRAVENOUS
  Filled 2019-11-25: qty 14

## 2019-11-25 MED ORDER — DIPHENHYDRAMINE HCL 25 MG PO CAPS
50.0000 mg | ORAL_CAPSULE | Freq: Once | ORAL | Status: AC
  Administered 2019-11-25: 50 mg via ORAL

## 2019-11-25 MED ORDER — PALONOSETRON HCL INJECTION 0.25 MG/5ML
INTRAVENOUS | Status: AC
  Filled 2019-11-25: qty 5

## 2019-11-25 MED ORDER — ACETAMINOPHEN 325 MG PO TABS
650.0000 mg | ORAL_TABLET | Freq: Once | ORAL | Status: AC
  Administered 2019-11-25: 650 mg via ORAL

## 2019-11-25 MED ORDER — HEPARIN SOD (PORK) LOCK FLUSH 100 UNIT/ML IV SOLN
500.0000 [IU] | Freq: Once | INTRAVENOUS | Status: AC | PRN
  Administered 2019-11-25: 500 [IU]
  Filled 2019-11-25: qty 5

## 2019-11-25 MED ORDER — ACETAMINOPHEN 325 MG PO TABS
ORAL_TABLET | ORAL | Status: AC
  Filled 2019-11-25: qty 2

## 2019-11-25 MED ORDER — POTASSIUM CHLORIDE CRYS ER 20 MEQ PO TBCR: 20.0000 meq | EXTENDED_RELEASE_TABLET | Freq: Two times a day (BID) | ORAL | 0 refills | Status: DC

## 2019-11-25 NOTE — Research (Signed)
E8315 - STUDYING HOW CANCER TREATMENT AFFECTS THE NERVES IN YOUR HANDS AND FEET   11/25/2019 0745AM   Stephanie Brewer arrives today to begin chemotherapy cycle 5 and complete her 12-week S1714 visit; she is unaccompanied today.  PROs: Stephanie Brewer completes the 12-week PROs: they are checked for completeness and there are no unanswered questions.  LABS: There are no research labs scheduled for today since Stephanie Brewer did not consent to optional lab specimens.  NEUROPEN/TUNING FORK: The neuropen and tuning fork assessments are completed without difficulty. All lower extremity and upper extremity vibration sites are tested on her dominant side (left) per protocol with the assistance of clinical research coordinator Stephanie Brewer as a Pharmacist, hospital.  MEDICATIONS: Stephanie Brewer has several new medications for mouth pain and lower extremity edema: - Magic mouthwash with lidocaine QID PRN - diphenhydramine HCl liquid QID PRN - Lasix 20mg  PO QD  - Hydrocortisone 10mg  tablet PO QD - Lidocaine HCl solution QID PRN - Nystatin suspension 1tsp QID PRN  SUPPLEMENTS/TOPICAL AGENTS/ALTERNATIVE MEDICINES: Stephanie Brewer denies taking any vitamins or supplements; she is currently not using any topical agents. The only complementary therapy used are ice packs to the hands and feet during chemotherapy administration to prevent neuropathy symptoms (she currently has no symptoms).   PROVIDER VISIT: Stephanie Brewer sees Stephanie Brewer today who completes and signs the solicited neuropathy event form as well as the follow-up physician assessment form for toxicity burden with a score of 3 (co-signed by [name]). Stephanie Brewer does verbalize mild, transient "tingling" in her toes only, typically starting about four days after a treatment but currently has no symptoms. She states the tingling is very brief and does not last; she has no symptoms today or over the past week.   Upon completion of the S1714 assessments, Stephanie Brewer is thanked for her time and continued participation in  the S1714 study. The current plan is to see Stephanie Brewer for her 24-week visit near the end of the year. Stephanie Brewer is encouraged to call me for any needs or questions: she verbalizes understanding.   TREATMENT: For her cycle 5 treatment today, Stephanie Brewer's taxane dose remains at 50mg /m2. Carboplatin has been reduced to 600mg .  Stephanie Brewer. Stephanie Brewer, BSN, RN, CIC 11/25/2019 10:49 AM

## 2019-11-25 NOTE — Assessment & Plan Note (Addendum)
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 Neoadjuvant chemotherapy with TCH Perjeta 6 cycles followed by HerceptinPerjeta versus Kadcylamaintenance for 1 year 2. Followed by breast conserving surgery with sentinel lymph node study 3. Followed by adjuvant radiation therapy if patient had lumpectomy  Breast MRI: 3.8 cm tumor and 1 axillary lymphnode. ---------------------------------------------------------------------------------------------------------------------------------------------------- Current treatment:Cycle5day 1TCH Perjeta (perjeta discontinued due to diarrhea) Echocardiogram EF 60 to 65% 08/25/2019  Chemo toxicities: Fatigue: managing with activity and energy conservation Diarrhea: Lomotil and intermittent IV fluids when needed Leg swelling: increased lasix to BID, refilled her potassium  She will take potassium daily for now, but often has to increase when she has diarrhea, so the script reads BID, however for now she will take it daily.    I placed orders for breast MRI, echo, and future anti her2 treatment through the end of the year.    She knows to call for any questions that may arise between now and her next appointment.  We are happy to see her sooner if needed.

## 2019-11-25 NOTE — Patient Instructions (Signed)

## 2019-11-25 NOTE — Progress Notes (Signed)
[facility] Follow up:    [name], DO [place] [address]   DIAGNOSIS: Cancer Staging Malignant neoplasm of upper-outer quadrant of right breast in female, estrogen receptor positive ([place]) Staging form: Breast, AJCC 8th Edition - Clinical stage from 08/20/2019: Stage IIA (cT1c, cN1, cM0, G2, ER+, PR-, HER2+) - Signed by Nicholas Lose, MD on 08/20/2019   SUMMARY OF ONCOLOGIC HISTORY: Oncology History  Malignant neoplasm of upper-outer quadrant of right breast in female, estrogen receptor positive ([employer])  07/28/2019 Initial Diagnosis   Screening mammogram detected right breast mass 11:30 position subareolar 1.3 cm, indeterminate 5 mm mass: Biopsy fibroadenoma, right axillary lymph node present, biopsy of the lymph node and the mass revealed grade 2 IDC ER 10%, PR 0%, Ki-67 45%, HER-2 +3+ by [facility]   08/20/2019 Cancer Staging   Staging form: Breast, AJCC 8th Edition - Clinical stage from 08/20/2019: Stage IIA (cT1c, cN1, cM0, G2, ER+, PR-, HER2+) - Signed by Nicholas Lose, MD on 08/20/2019   09/02/2019 -  Chemotherapy   The patient had dexamethasone (DECADRON) 4 MG tablet, 4 mg (100 % of original dose 4 mg), Oral, Daily, 1 of 1 cycle, Start date: 08/20/2019, End date: -- Dose modification: 4 mg (original dose 4 mg, Cycle 0) palonosetron (ALOXI) injection 0.25 mg, 0.25 mg, Intravenous,  Once, 4 of 6 cycles Administration: 0.25 mg (09/02/2019), 0.25 mg (09/23/2019), 0.25 mg (10/14/2019), 0.25 mg (11/04/2019) pegfilgrastim-jmdb (FULPHILA) injection 6 mg, 6 mg, Subcutaneous,  Once, 4 of 6 cycles Administration: 6 mg (09/04/2019), 6 mg (09/25/2019), 6 mg (10/16/2019), 6 mg (11/06/2019) CARBOplatin (PARAPLATIN) 700 mg in sodium chloride 0.9 % 250 mL chemo infusion, 700 mg (100 % of original dose 700 mg), Intravenous,  Once, 4 of 6 cycles Dose modification: 700 mg (original dose 700 mg, Cycle 1), 700 mg (original dose 700 mg, Cycle 5), 600 mg (original dose 700 mg, Cycle  5, Reason: Other (see comments), Comment: dose reduce to 600 mg for CINV per [name]) Administration: 700 mg (09/02/2019), 700 mg (09/23/2019), 700 mg (10/14/2019), 600 mg (11/04/2019) DOCEtaxel (TAXOTERE) 150 mg in sodium chloride 0.9 % 250 mL chemo infusion, 75 mg/m2 = 150 mg, Intravenous,  Once, 4 of 6 cycles Dose modification: 50 mg/m2 (original dose 75 mg/m2, Cycle 5, Reason: Dose not tolerated), 50 mg/m2 (original dose 75 mg/m2, Cycle 4, Reason: Dose not tolerated) Administration: 150 mg (09/02/2019), 150 mg (09/23/2019), 150 mg (10/14/2019), 100 mg (11/04/2019) fosaprepitant (EMEND) 150 mg in sodium chloride 0.9 % 145 mL IVPB, 150 mg, Intravenous,  Once, 4 of 6 cycles Administration: 150 mg (09/02/2019), 150 mg (09/23/2019), 150 mg (10/14/2019), 150 mg (11/04/2019) pertuzumab (PERJETA) 420 mg in sodium chloride 0.9 % 250 mL chemo infusion, 420 mg (100 % of original dose 420 mg), Intravenous, Once, 4 of 6 cycles Dose modification: 420 mg (original dose 420 mg, Cycle 1, Reason: Provider Judgment) Administration: 420 mg (09/02/2019), 420 mg (09/23/2019), 420 mg (10/14/2019), 420 mg (11/04/2019) trastuzumab-dkst (OGIVRI) 672 mg in sodium chloride 0.9 % 250 mL chemo infusion, 8 mg/kg = 672 mg, Intravenous,  Once, 4 of 6 cycles Administration: 672 mg (09/02/2019), 504 mg (09/23/2019), 504 mg (10/14/2019), 504 mg (11/04/2019)  for chemotherapy treatment.      CURRENT THERAPY: Neoadjuvant Taxotere, Carboplatin, Herceptin (perjeta discontinued due to diarrhea)  INTERVAL HISTORY: [name] 54 y.o. female returns for follow up and evaluation of her breast cancer prior to her fifth cycle of neoadjuvant chemotherapy.  She  is doing moderately well.  She does have diarrhea following treatment, and if this dehydrates her she will get in with our [facility] provider Sandi Mealy for fluids and electrolyte monitoring.  She just completed her potassium, and it was normal today at 3.7.  She notes that on day 4 or 5 after chemotherapy she  will have mild paresthesia in the tips of her toes that resolves.  It is not currently present.  Her activity level remains good, she does have some fatigue following chemotherapy, however she is able to manage it.  She has had some leg swelling worse since Friday.  No other symptoms such as orthopnea, DOE.  She is taking Lasix daily for this, and it is not helping.     Patient Active Problem List   Diagnosis Date Noted  . Port-A-Cath in place 09/09/2019  . Malignant neoplasm of upper-outer quadrant of right breast in female, estrogen receptor positive ([place]) 08/20/2019  . Radicular leg pain 02/04/2018  . Numbness and tingling 02/04/2018  . Foot pain, left 02/04/2018  . Displacement of breast implant 12/24/2017  . H/O bilateral breast implants 12/24/2017  . GERD (gastroesophageal reflux disease) 12/06/2015  . Gluten intolerance 12/06/2015    is allergic to hyoscyamine, caffeine, doxycycline, and nexium [esomeprazole].  MEDICAL HISTORY: Past Medical History:  Diagnosis Date  . DVT (deep venous thrombosis) (H[facility])    left leg knee and ankle     SURGICAL HISTORY: Past Surgical History:  Procedure Laterality Date  . BREAST SURGERY     Augmentation 2003  . HERNIA REPAIR  2002  . PORTACATH PLACEMENT N/A 09/01/2019   Procedure: INSERTION PORT-A-CATH WITH ULTRASOUND GUIDANCE;  Surgeon: Rolm Bookbinder, MD;  Location: [facility];  Service: General;  Laterality: N/A;    SOCIAL HISTORY: Social History   Socioeconomic History  . Marital status: Married    Spouse name: Not on file  . Number of children: 2  . Years of education: some college  . Highest education level: Not on file  Occupational History  . Occupation: [employer]ist, newspaper  Tobacco Use  . Smoking status: Never Smoker  . Smokeless tobacco: Never Used  Vaping Use  . Vaping Use: Never used  Substance and Sexual Activity  . Alcohol use: Yes    Comment: rare-twice monthly or less  . Drug use: No  . Sexual  activity: Yes    Birth control/protection: I.U.D.  Other Topics Concern  . Not on file  Social History Narrative   Lives with spouse   Caffeine use: no caffeine    Left handed    Social Determinants of Health   Financial Resource Strain:   . Difficulty of Paying Living Expenses: Not on file  Food Insecurity:   . Worried About Charity fundraiser in the Last Year: Not on file  . Ran Out of Food in the Last Year: Not on file  Transportation Needs:   . Lack of Transportation (Medical): Not on file  . Lack of Transportation (Non-Medical): Not on file  Physical Activity:   . Days of Exercise per Week: Not on file  . Minutes of Exercise per Session: Not on file  Stress:   . Feeling of Stress : Not on file  Social Connections:   . Frequency of Communication with Friends and Family: Not on file  . Frequency of Social Gatherings with Friends and Family: Not on file  . Attends Religious Services: Not on file  . Active Member of Clubs  or Organizations: Not on file  . Attends Archivist Meetings: Not on file  . Marital Status: Not on file  Intimate Partner Violence:   . Fear of Current or Ex-Partner: Not on file  . Emotionally Abused: Not on file  . Physically Abused: Not on file  . Sexually Abused: Not on file    FAMILY HISTORY: Family History  Problem Relation Age of Onset  . Hypertension Father     Review of Systems  Constitutional: Positive for fatigue. Negative for appetite change, chills, fever and unexpected weight change.  HENT:   Negative for hearing loss, lump/mass, sore throat and trouble swallowing.   Eyes: Negative for eye problems and icterus.  Respiratory: Negative for chest tightness, cough and shortness of breath.   Cardiovascular: Positive for leg swelling. Negative for chest pain and palpitations.  Gastrointestinal: Positive for diarrhea. Negative for abdominal distention, abdominal pain, constipation, nausea and vomiting.  Endocrine: Negative for  hot flashes.  Genitourinary: Negative for difficulty urinating.   Musculoskeletal: Negative for arthralgias.  Skin: Negative for itching and rash.  Neurological: Negative for dizziness, extremity weakness, headaches and numbness.  Hematological: Negative for adenopathy. Does not bruise/bleed easily.  Psychiatric/Behavioral: Negative for depression. The patient is not nervous/anxious.       PHYSICAL EXAMINATION  ECOG PERFORMANCE STATUS: 1 - Symptomatic but completely ambulatory  Vitals:   11/25/19 0848  BP: 123/69  Pulse: 77  Resp: 18  Temp: 98.1 F (36.7 C)  SpO2: 100%    Physical Exam Constitutional:      General: She is not in acute distress.    Appearance: Normal appearance. She is not toxic-appearing.  HENT:     Head: Normocephalic.  Eyes:     General: No scleral icterus. Cardiovascular:     Rate and Rhythm: Normal rate and regular rhythm.     Pulses: Normal pulses.     Heart sounds: Normal heart sounds.  Pulmonary:     Effort: Pulmonary effort is normal.     Breath sounds: Normal breath sounds.  Abdominal:     General: Abdomen is flat. There is no distension.     Palpations: Abdomen is soft.     Tenderness: There is no abdominal tenderness.  Genitourinary:    Comments: +1cm tender lymphadenopathy in right inguinal area, no other skin changes noted, there were a couple of erythematous hair follicles in her groin as well  Musculoskeletal:     Cervical back: Neck supple.  Lymphadenopathy:     Cervical: No cervical adenopathy.  Skin:    General: Skin is warm and dry.     Capillary Refill: Capillary refill takes less than 2 seconds.     Findings: No rash.  Neurological:     General: No focal deficit present.     Mental Status: She is alert.  Psychiatric:        Mood and Affect: Mood normal.        Behavior: Behavior normal.     LABORATORY DATA:  CBC    Component Value Date/Time   WBC 6.2 11/25/2019 0759   RBC 3.20 (L) 11/25/2019 0759   HGB 9.7 (L)  11/25/2019 0759   HGB 13.5 07/20/2019 1405   HCT 29.7 (L) 11/25/2019 0759   HCT 40.5 07/20/2019 1405   PLT 200 11/25/2019 0759   PLT 365 07/20/2019 1405   MCV 92.8 11/25/2019 0759   MCV 84 07/20/2019 1405   MCH 30.3 11/25/2019 0759   MCHC 32.7 11/25/2019 0759  RDW 17.6 (H) 11/25/2019 0759   RDW 12.9 07/20/2019 1405   LYMPHSABS 1.2 11/25/2019 0759   LYMPHSABS 1.2 12/24/2017 0843   MONOABS 0.7 11/25/2019 0759   EOSABS 0.0 11/25/2019 0759   EOSABS 0.2 12/24/2017 0843   BASOSABS 0.0 11/25/2019 0759   BASOSABS 0.1 12/24/2017 0843    CMP     Component Value Date/Time   NA 139 11/25/2019 0759   NA 140 07/20/2019 1405   K 3.7 11/25/2019 0759   CL 108 11/25/2019 0759   CO2 23 11/25/2019 0759   GLUCOSE 119 (H) 11/25/2019 0759   BUN 8 11/25/2019 0759   BUN 10 07/20/2019 1405   CREATININE 0.65 11/25/2019 0759   CALCIUM 9.5 11/25/2019 0759   PROT 6.2 (L) 11/25/2019 0759   PROT 7.4 07/20/2019 1405   ALBUMIN 3.3 (L) 11/25/2019 0759   ALBUMIN 4.4 07/20/2019 1405   AST 36 11/25/2019 0759   ALT 39 11/25/2019 0759   ALKPHOS 93 11/25/2019 0759   BILITOT 0.4 11/25/2019 0759   GFRNONAA >60 11/25/2019 0759   GFRAA >60 11/25/2019 0759         ASSESSMENT and THERAPY PLAN:   Malignant neoplasm of upper-outer quadrant of right breast in female, estrogen receptor positive ([place]) 07/28/2019:Screening mammogram detected right breast mass 11:30 position subareolar 1.3 cm, indeterminate 5 mm mass: Biopsy fibroadenoma, right axillary lymph node present, biopsy of the lymph node and the mass revealed grade 2 IDC ER 10%, PR 0%, Ki-67 45%, HER-2 +3+ by IHC T1 cN1 M0 stage IIa  Treatment plan: 1. Neoadjuvant chemotherapy with TCH Perjeta 6 cycles followed by HerceptinPerjeta versus Kadcylamaintenance for 1 year 2. Followed by breast conserving surgery with sentinel lymph node study 3. Followed by adjuvant radiation therapy if patient had lumpectomy  Breast MRI: 3.8 cm tumor and 1  axillary lymphnode. ---------------------------------------------------------------------------------------------------------------------------------------------------- Current treatment:Cycle5day 1TCH Perjeta (perjeta discontinued due to diarrhea) Echocardiogram EF 60 to 65% 08/25/2019  Chemo toxicities: Fatigue: managing with activity and energy conservation Diarrhea: Lomotil and intermittent IV fluids when needed Leg swelling: increased lasix to BID, refilled her potassium  She will take potassium daily for now, but often has to increase when she has diarrhea, so the script reads BID, however for now she will take it daily.    I placed orders for breast MRI, echo, and future anti her2 treatment through the end of the year.    She knows to call for any questions that may arise between now and her next appointment.  We are happy to see her sooner if needed.     Orders Placed This Encounter  Procedures  . MR BREAST BILATERAL W WO CONTRAST INC CAD    Standing Status:   Future    Standing Expiration Date:   11/24/2020    Order Specific Question:   If indicated for the ordered procedure, I authorize the administration of contrast media per Radiology protocol    Answer:   Yes    Order Specific Question:   What is the patient's sedation requirement?    Answer:   No Sedation    Order Specific Question:   Does the patient have a pacemaker or implanted devices?    Answer:   No    Order Specific Question:   Radiology Contrast Protocol - do NOT remove file path    Answer:   [email or web address]    Order Specific Question:   Preferred imaging location?    Answer:   [facility] (table limit - 550  lbs)  . ECHOCARDIOGRAM COMPLETE    Standing Status:   Future    Standing Expiration Date:   11/24/2020    Order Specific Question:   Where should this test be performed    Answer:   Center    Order Specific Question:   Perflutren DEFINITY (image  enhancing agent) should be administered unless hypersensitivity or allergy exist    Answer:   Administer Perflutren    Order Specific Question:   Is a special reader required? (athlete or structural heart)    Answer:   No    Order Specific Question:   Reason for exam-Echo    Answer:   Chemotherapy evaluation  v87.41 / v58.11   Total encounter time: 30 minutes*  Wilber Bihari, NP 11/25/19 9:30 AM Medical Oncology and Hematology [facility] [place], [place] [address] Tel. [phone]    Fax. [phone]  *Total Encounter Time as defined by the Centers for Medicare and Medicaid Services includes, in addition to the face-to-face time of a patient visit (documented in the note above) non-face-to-face time: obtaining and reviewing outside history, ordering and reviewing medications, tests or procedures, care coordination (communications with other health care professionals or caregivers) and documentation in the medical record.

## 2019-11-25 NOTE — Patient Instructions (Signed)
[facility] Discharge Instructions for Patients Receiving Chemotherapy  Today you received the following chemotherapy agents Trastuzumab (OGIVRI), Pertuzumab (PERJETA), Docetaxel (TAXOTERE) & Carboplatin (PARAPLATIN).  To help prevent nausea and vomiting after your treatment, we encourage you to take your nausea medication as prescribed.   If you develop nausea and vomiting that is not controlled by your nausea medication, call the clinic.   BELOW ARE SYMPTOMS THAT SHOULD BE REPORTED IMMEDIATELY:  *FEVER GREATER THAN 100.5 F  *CHILLS WITH OR WITHOUT FEVER  NAUSEA AND VOMITING THAT IS NOT CONTROLLED WITH YOUR NAUSEA MEDICATION  *UNUSUAL SHORTNESS OF BREATH  *UNUSUAL BRUISING OR BLEEDING  TENDERNESS IN MOUTH AND THROAT WITH OR WITHOUT PRESENCE OF ULCERS  *URINARY PROBLEMS  *BOWEL PROBLEMS  UNUSUAL RASH Items with * indicate a potential emergency and should be followed up as soon as possible.  Feel free to call the clinic should you have any questions or concerns. The clinic phone number is [phone].  Please show the [facility] at check-in to the Emergency Department and triage nurse.

## 2019-11-25 NOTE — Progress Notes (Signed)
Nutrition Assessment:  Patient identified on Malnutrition Screening report for weight loss and poor appetite.   54 year old female with right breast cancer.  Patient receiving neoadjuvant chemotherapy (perjeta discontinued due to diarrhea). Past medical history of GERD  Met with patient during infusion.  Patient reports for about a week following chemotherapy appetite is low.  Has had issues with nausea, vomiting and diarrhea. Reports during this week's time it is difficult for her to eat. Also has mouth sores.    Medications: decadron, ativan, compazine, zofran, lomotil, lasix, magic mouthwash, nystatin  Labs: reviewed  Anthropometrics:   Height: 64 inches Weight: 180 lb 9.6 oz today 173 lb on 8/13 UBW: 180s BMI: 29   NUTRITION DIAGNOSIS: Inadequate oral intake related to cancer related treatment side effects as evidenced by poor appetite, diarrhea, nausea and vomiting and fluctuating weight    INTERVENTION:  Reviewed strategies to help with diarrhea and nausea/vomiting.  Handouts given along with recipes.   Contact information provided    MONITORING, EVALUATION, GOAL: weight trends, intake   NEXT VISIT: Sept 15 during infusion  Joli B. Allen, RD, LDN Registered Dietitian [phone] (mobile)     

## 2019-11-26 ENCOUNTER — Telehealth: Payer: Self-pay

## 2019-11-26 ENCOUNTER — Telehealth: Payer: Self-pay | Admitting: Hematology and Oncology

## 2019-11-26 ENCOUNTER — Other Ambulatory Visit: Payer: Self-pay

## 2019-11-26 DIAGNOSIS — Z170 Estrogen receptor positive status [ER+]: Secondary | ICD-10-CM

## 2019-11-26 DIAGNOSIS — C50411 Malignant neoplasm of upper-outer quadrant of right female breast: Secondary | ICD-10-CM

## 2019-11-26 NOTE — Telephone Encounter (Signed)
Pt called and states she needs echo scheduled before 9/15 as it is time. No orders for echo currently. Mendel Ryder, please advise on this and I can get it scheduled for pt if need be. Thanks!

## 2019-11-26 NOTE — Telephone Encounter (Signed)
P{lease order echo complete.  Thank you.

## 2019-11-26 NOTE — Progress Notes (Signed)
Pt scheduled for echo at [facility] 8/31 at 0900; 0845 arrival. Pt aware and in agreement.

## 2019-11-26 NOTE — Telephone Encounter (Signed)
scheduled appts per 8/25 los. Left voicemail with appt info.

## 2019-11-27 ENCOUNTER — Encounter: Payer: Self-pay | Admitting: *Deleted

## 2019-11-27 ENCOUNTER — Inpatient Hospital Stay: Payer: BC Managed Care – PPO

## 2019-11-27 ENCOUNTER — Other Ambulatory Visit: Payer: Self-pay

## 2019-11-27 VITALS — BP 132/80 | HR 66 | Temp 98.6°F | Resp 18

## 2019-11-27 DIAGNOSIS — R112 Nausea with vomiting, unspecified: Secondary | ICD-10-CM | POA: Diagnosis not present

## 2019-11-27 DIAGNOSIS — Z170 Estrogen receptor positive status [ER+]: Secondary | ICD-10-CM | POA: Diagnosis not present

## 2019-11-27 DIAGNOSIS — C50411 Malignant neoplasm of upper-outer quadrant of right female breast: Secondary | ICD-10-CM | POA: Diagnosis not present

## 2019-11-27 DIAGNOSIS — E876 Hypokalemia: Secondary | ICD-10-CM | POA: Diagnosis not present

## 2019-11-27 DIAGNOSIS — R197 Diarrhea, unspecified: Secondary | ICD-10-CM | POA: Diagnosis not present

## 2019-11-27 DIAGNOSIS — Z5112 Encounter for antineoplastic immunotherapy: Secondary | ICD-10-CM | POA: Diagnosis not present

## 2019-11-27 DIAGNOSIS — Z5189 Encounter for other specified aftercare: Secondary | ICD-10-CM | POA: Diagnosis not present

## 2019-11-27 DIAGNOSIS — C773 Secondary and unspecified malignant neoplasm of axilla and upper limb lymph nodes: Secondary | ICD-10-CM | POA: Diagnosis not present

## 2019-11-27 DIAGNOSIS — R05 Cough: Secondary | ICD-10-CM | POA: Diagnosis not present

## 2019-11-27 DIAGNOSIS — Z5111 Encounter for antineoplastic chemotherapy: Secondary | ICD-10-CM | POA: Diagnosis not present

## 2019-11-27 MED ORDER — PEGFILGRASTIM-JMDB 6 MG/0.6ML ~~LOC~~ SOSY
6.0000 mg | PREFILLED_SYRINGE | Freq: Once | SUBCUTANEOUS | Status: AC
  Administered 2019-11-27: 6 mg via SUBCUTANEOUS

## 2019-11-27 MED ORDER — PEGFILGRASTIM-JMDB 6 MG/0.6ML ~~LOC~~ SOSY
PREFILLED_SYRINGE | SUBCUTANEOUS | Status: AC
  Filled 2019-11-27: qty 0.6

## 2019-11-27 NOTE — Patient Instructions (Signed)

## 2019-12-01 ENCOUNTER — Other Ambulatory Visit: Payer: Self-pay

## 2019-12-01 ENCOUNTER — Ambulatory Visit (HOSPITAL_COMMUNITY)
Admission: RE | Admit: 2019-12-01 | Discharge: 2019-12-01 | Disposition: A | Discharge status: Home / Self Care | Payer: BC Managed Care – PPO | Source: Ambulatory Visit | Attending: Adult Health | Admitting: Adult Health

## 2019-12-01 DIAGNOSIS — C50411 Malignant neoplasm of upper-outer quadrant of right female breast: Secondary | ICD-10-CM | POA: Insufficient documentation

## 2019-12-01 DIAGNOSIS — I82409 Acute embolism and thrombosis of unspecified deep veins of unspecified lower extremity: Secondary | ICD-10-CM | POA: Insufficient documentation

## 2019-12-01 DIAGNOSIS — Z01818 Encounter for other preprocedural examination: Secondary | ICD-10-CM | POA: Insufficient documentation

## 2019-12-01 DIAGNOSIS — Z9882 Breast implant status: Secondary | ICD-10-CM | POA: Diagnosis not present

## 2019-12-01 DIAGNOSIS — Z170 Estrogen receptor positive status [ER+]: Secondary | ICD-10-CM | POA: Diagnosis not present

## 2019-12-01 LAB — ECHOCARDIOGRAM COMPLETE
Area-P 1/2: 3.12 cm2
S' Lateral: 2.3 cm

## 2019-12-01 NOTE — Progress Notes (Signed)
  Echocardiogram 2D Echocardiogram has been performed.  Stephanie Brewer 12/01/2019, 10:25 AM

## 2019-12-03 ENCOUNTER — Telehealth: Payer: Self-pay

## 2019-12-03 ENCOUNTER — Encounter: Payer: Self-pay | Admitting: Adult Health

## 2019-12-03 ENCOUNTER — Other Ambulatory Visit: Payer: Self-pay | Admitting: *Deleted

## 2019-12-03 DIAGNOSIS — C50411 Malignant neoplasm of upper-outer quadrant of right female breast: Secondary | ICD-10-CM

## 2019-12-03 DIAGNOSIS — Z170 Estrogen receptor positive status [ER+]: Secondary | ICD-10-CM

## 2019-12-03 NOTE — Telephone Encounter (Signed)
Spoke with pt and [facility]; she will see Lucianne Lei in [facility] 9/3 for mouth sores/fluids. Pt is in agreement to come at 1300 for check in.

## 2019-12-04 ENCOUNTER — Inpatient Hospital Stay: Payer: BC Managed Care – PPO | Attending: Hematology and Oncology | Admitting: Medical

## 2019-12-04 ENCOUNTER — Ambulatory Visit: Payer: BC Managed Care – PPO

## 2019-12-04 ENCOUNTER — Telehealth: Payer: Self-pay | Admitting: *Deleted

## 2019-12-04 ENCOUNTER — Inpatient Hospital Stay: Payer: BC Managed Care – PPO | Admitting: Medical

## 2019-12-04 ENCOUNTER — Other Ambulatory Visit: Payer: Self-pay

## 2019-12-04 VITALS — BP 123/77 | HR 78 | Temp 97.5°F | Resp 20 | Ht 64.0 in | Wt 170.3 lb

## 2019-12-04 DIAGNOSIS — Z95828 Presence of other vascular implants and grafts: Secondary | ICD-10-CM

## 2019-12-04 DIAGNOSIS — R197 Diarrhea, unspecified: Secondary | ICD-10-CM

## 2019-12-04 DIAGNOSIS — E876 Hypokalemia: Secondary | ICD-10-CM | POA: Diagnosis not present

## 2019-12-04 DIAGNOSIS — K1230 Oral mucositis (ulcerative), unspecified: Secondary | ICD-10-CM | POA: Diagnosis not present

## 2019-12-04 DIAGNOSIS — Z5111 Encounter for antineoplastic chemotherapy: Secondary | ICD-10-CM | POA: Diagnosis not present

## 2019-12-04 DIAGNOSIS — Z5189 Encounter for other specified aftercare: Secondary | ICD-10-CM | POA: Diagnosis not present

## 2019-12-04 DIAGNOSIS — C50411 Malignant neoplasm of upper-outer quadrant of right female breast: Secondary | ICD-10-CM | POA: Diagnosis not present

## 2019-12-04 DIAGNOSIS — Z170 Estrogen receptor positive status [ER+]: Secondary | ICD-10-CM | POA: Insufficient documentation

## 2019-12-04 DIAGNOSIS — E860 Dehydration: Secondary | ICD-10-CM | POA: Diagnosis not present

## 2019-12-04 DIAGNOSIS — C773 Secondary and unspecified malignant neoplasm of axilla and upper limb lymph nodes: Secondary | ICD-10-CM | POA: Insufficient documentation

## 2019-12-04 DIAGNOSIS — K219 Gastro-esophageal reflux disease without esophagitis: Secondary | ICD-10-CM | POA: Insufficient documentation

## 2019-12-04 LAB — CBC WITH DIFFERENTIAL (CANCER CENTER ONLY)
Abs Immature Granulocytes: 0.92 10*3/uL — ABNORMAL HIGH (ref 0.00–0.07)
Basophils Absolute: 0.1 10*3/uL (ref 0.0–0.1)
Basophils Relative: 1 %
Eosinophils Absolute: 0 10*3/uL (ref 0.0–0.5)
Eosinophils Relative: 0 %
HCT: 34.9 % — ABNORMAL LOW (ref 36.0–46.0)
Hemoglobin: 11.6 g/dL — ABNORMAL LOW (ref 12.0–15.0)
Immature Granulocytes: 7 %
Lymphocytes Relative: 14 %
Lymphs Abs: 1.9 10*3/uL (ref 0.7–4.0)
MCH: 30.4 pg (ref 26.0–34.0)
MCHC: 33.2 g/dL (ref 30.0–36.0)
MCV: 91.6 fL (ref 80.0–100.0)
Monocytes Absolute: 1.9 10*3/uL — ABNORMAL HIGH (ref 0.1–1.0)
Monocytes Relative: 15 %
Neutro Abs: 8.3 10*3/uL — ABNORMAL HIGH (ref 1.7–7.7)
Neutrophils Relative %: 63 %
Platelet Count: 241 10*3/uL (ref 150–400)
RBC: 3.81 MIL/uL — ABNORMAL LOW (ref 3.87–5.11)
RDW: 16.4 % — ABNORMAL HIGH (ref 11.5–15.5)
WBC Count: 13.2 10*3/uL — ABNORMAL HIGH (ref 4.0–10.5)
nRBC: 0.3 % — ABNORMAL HIGH (ref 0.0–0.2)

## 2019-12-04 LAB — CMP (CANCER CENTER ONLY)
ALT: 56 U/L — ABNORMAL HIGH (ref 0–44)
AST: 49 U/L — ABNORMAL HIGH (ref 15–41)
Albumin: 3.9 g/dL (ref 3.5–5.0)
Alkaline Phosphatase: 131 U/L — ABNORMAL HIGH (ref 38–126)
Anion gap: 9 (ref 5–15)
BUN: 11 mg/dL (ref 6–20)
CO2: 28 mmol/L (ref 22–32)
Calcium: 9.2 mg/dL (ref 8.9–10.3)
Chloride: 98 mmol/L (ref 98–111)
Creatinine: 0.77 mg/dL (ref 0.44–1.00)
GFR, Est AFR Am: >60 mL/min (ref >60)
GFR, Estimated: >60 mL/min (ref >60)
Glucose, Bld: 102 mg/dL — ABNORMAL HIGH (ref 70–99)
Potassium: 3 mmol/L — CL (ref 3.5–5.1)
Sodium: 135 mmol/L (ref 135–145)
Total Bilirubin: 0.4 mg/dL (ref 0.3–1.2)
Total Protein: 7 g/dL (ref 6.5–8.1)

## 2019-12-04 LAB — MAGNESIUM: Magnesium: 1.6 mg/dL — ABNORMAL LOW (ref 1.7–2.4)

## 2019-12-04 MED ORDER — HEPARIN SOD (PORK) LOCK FLUSH 100 UNIT/ML IV SOLN
500.0000 [IU] | Freq: Once | INTRAVENOUS | Status: AC
  Administered 2019-12-04: 500 [IU]
  Filled 2019-12-04: qty 5

## 2019-12-04 MED ORDER — DIPHENOXYLATE-ATROPINE 2.5-0.025 MG PO TABS: ORAL_TABLET | ORAL | 3 refills | Status: DC

## 2019-12-04 MED ORDER — NIZATIDINE 150 MG PO CAPS: 150.0000 mg | ORAL_CAPSULE | Freq: Two times a day (BID) | ORAL | 2 refills | Status: DC

## 2019-12-04 MED ORDER — SODIUM CHLORIDE 0.9% FLUSH
10.0000 mL | Freq: Once | INTRAVENOUS | Status: AC
  Administered 2019-12-04: 10 mL
  Filled 2019-12-04: qty 10

## 2019-12-04 MED ORDER — LIDOCAINE VISCOUS HCL 2 % MT SOLN: 5.0000 mL | OROMUCOSAL | 1 refills | Status: DC | PRN

## 2019-12-04 MED ORDER — SODIUM CHLORIDE 0.9 % IV SOLN
Freq: Once | INTRAVENOUS | Status: AC
  Filled 2019-12-04: qty 250

## 2019-12-04 NOTE — Telephone Encounter (Signed)
CRITICAL VALUE STICKER  CRITICAL VALUE: potassium  RECEIVER (on-site recipient of call): Georgina Pillion, RN  DATE & TIME NOTIFIED: 12/04/19 @ 1432  MESSENGER (representative from lab): Pam  MD NOTIFIED: Sandi Mealy, PA  TIME OF NOTIFICATION: 1438  RESPONSE: Acknowledged/no new orders recevied

## 2019-12-04 NOTE — Patient Instructions (Signed)

## 2019-12-10 ENCOUNTER — Ambulatory Visit (INDEPENDENT_AMBULATORY_CARE_PROVIDER_SITE_OTHER): Payer: BC Managed Care – PPO | Admitting: Plastic Surgery

## 2019-12-10 ENCOUNTER — Other Ambulatory Visit: Payer: Self-pay

## 2019-12-10 ENCOUNTER — Encounter: Payer: Self-pay | Admitting: Plastic Surgery

## 2019-12-10 VITALS — BP 97/67 | HR 89 | Temp 98.3°F | Ht 64.0 in | Wt 177.2 lb

## 2019-12-10 DIAGNOSIS — Z170 Estrogen receptor positive status [ER+]: Secondary | ICD-10-CM

## 2019-12-10 DIAGNOSIS — C50411 Malignant neoplasm of upper-outer quadrant of right female breast: Secondary | ICD-10-CM

## 2019-12-10 NOTE — Progress Notes (Signed)
Referring Provider [name], DO [place],  [place] [address]   CC:  Chief Complaint  Patient presents with  . Advice Only      Stephanie Brewer is an 54 y.o. female.  HPI: Patient presents to discuss breast reconstruction.  She was recently diagnosed with a right-sided breast cancer.  She also had a positive lymph node.  She is currently undergoing neoadjuvant chemotherapy.  There is an upcoming MRI scheduled for a few weeks and if that shows improvement she will likely undergo breast conservation therapy followed by adjuvant radiation and further hormonal treatment.  She had subpectoral breast implants placed many years ago for cosmetic reasons.  She would like to be quite a bit smaller if possible.  She would like to be a B or a C cup.  She was sent by Dr. Donne Hazel.  Allergies  Allergen Reactions  . Hyoscyamine Anaphylaxis  . Caffeine     Rapid heart rate.  . Doxycycline     Racing heart.  Marland Kitchen Nexium [Esomeprazole]     Swollen throat    Outpatient Encounter Medications as of 12/10/2019  Medication Sig Note  . ALLERGY CHILDRENS 12.5 MG/5ML liquid SWISH & SPIT 1WTEASPOONFUL 4 TIMES A DAYTAS NEEDED FOR MOUTH PAIN   . CORTEF 10 MG tablet Take by mouth.   . dexamethasone (DECADRON) 4 MG tablet Take 1 tablet (4 mg total) by mouth daily. Take 1 tablet day before chemo and 1 tablet day after chemo with food   . diphenoxylate-atropine (LOMOTIL) 2.5-0.025 MG tablet 1 to 2 four times daily as needed for diarrhea   . furosemide (LASIX) 20 MG tablet Take 1 tablet (20 mg total) by mouth 2 (two) times daily.   Marland Kitchen lidocaine (XYLOCAINE) 2 % solution SWISH & SPIT 1WTEASPOONFUL 4 TIMES A DAYTAS NEEDED FOR MOUTH PAIN   . lidocaine (XYLOCAINE) 2 % solution Use as directed 5 mLs in the mouth or throat as needed for mouth pain.   Marland Kitchen lidocaine-prilocaine (EMLA) cream Apply to affected area once   . LORazepam (ATIVAN) 0.5 MG tablet Take 1 tablet (0.5 mg total) by mouth at bedtime as  needed for sleep.   . magic mouthwash w/lidocaine SOLN Take 5 mLs by mouth 4 (four) times daily as needed for mouth pain.   . Minocycline HCl 90 MG TB24 Take 1 tablet by mouth daily. Only takes prn for break outs   . nystatin (MYCOSTATIN) 100000 UNIT/ML suspension SMARTSIG:1 Teaspoon By Mouth 4 Times Daily PRN   . ondansetron (ZOFRAN) 8 MG tablet Take 1 tablet (8 mg total) by mouth 2 (two) times daily as needed (Nausea or vomiting). Begin 4 days after chemotherapy.   . potassium chloride SA (KLOR-CON) 20 MEQ tablet Take 1 tablet (20 mEq total) by mouth 2 (two) times daily.   . prochlorperazine (COMPAZINE) 10 MG tablet Take 1 tablet (10 mg total) by mouth every 6 (six) hours as needed (Nausea or vomiting).   . rivaroxaban (XARELTO) 20 MG TABS tablet Take 1 tablet (20 mg total) by mouth daily with supper. x5 months. Then stop.   Marland Kitchen saccharomyces boulardii (FLORASTOR) 250 MG capsule Take 250 mg by mouth daily. 10/14/2019: Per patient report, started 09/16/2019  . nizatidine (AXID) 150 MG capsule Take 1 capsule (150 mg total) by mouth 2 (two) times daily. (Patient not taking: Reported on 12/10/2019)    Facility-Administered Encounter Medications as of 12/10/2019  Medication  . sodium chloride flush (NS) 0.9 % injection  10 mL     Past Medical History:  Diagnosis Date  . DVT (deep venous thrombosis) (HCC)    left leg knee and ankle     Past Surgical History:  Procedure Laterality Date  . BREAST SURGERY     Augmentation 2003  . HERNIA REPAIR  2002  . PORTACATH PLACEMENT N/A 09/01/2019   Procedure: INSERTION PORT-A-CATH WITH ULTRASOUND GUIDANCE;  Surgeon: Rolm Bookbinder, MD;  Location: [facility];  Service: General;  Laterality: N/A;    Family History  Problem Relation Age of Onset  . Hypertension Father     Social History   Social History Narrative   Lives with spouse   Caffeine use: no caffeine    Left handed   Denies tobacco use  Review of Systems General: Denies  fevers, chills, weight loss CV: Denies chest pain, shortness of breath, palpitations  Physical Exam Vitals with BMI 12/10/2019 12/04/2019 12/04/2019  Height 5\' 4"  - 5\' 4"   Weight 177 lbs 3 oz - 170 lbs 5 oz  BMI 50.2 - 77.41  Systolic 97 287 867  Diastolic 67 77 93  Pulse 89 78 116    General:  No acute distress,  Alert and oriented, Non-Toxic, Normal speech and affect Breast: She has bilateral implants that feel like saline that were placed through inframammary incisions.  She is overall symmetric.  The implants are fairly close together medially.  Assessment/Plan Patient presents with a right-sided breast cancer with plans to undergo breast conservation therapy after completing neoadjuvant chemotherapy.  She would like her implants removed and would like to be much smaller.  I think the best time to do this would be at the time of her lumpectomy.  I believe that would facilitate adjuvant radiation therapy and may actually end up putting her at the size that she wants to be.  Follow-up procedures for size and symmetry can be planned after completing radiation and hormonal therapy after things settle out and she evaluates her goals.  She does have an upcoming MRI and if there are any surprises there may be chance she changes to a mastectomy in which case I would need to have another discussion with her.  We did briefly touched on autologous and implant-based reconstruction but we would need to come up with a more specific plan if that is the direction she ends up going.  I did my best to give her an idea of the options that would be available down the line that would include fat grafting and potential contralateral lift procedure if the right side ends up contracting and moving superiorly after radiation.  This plan seems to suit her quite well and I will plan to coordinate with her breast surgeon Dr. Donne Hazel to make sure he thinks this is a suitable plan also.  All of her questions were  answered.  Cindra Presume 12/10/2019, 4:44 PM

## 2019-12-11 NOTE — Progress Notes (Signed)
[facility] Progress Note   Stephanie Brewer [id number] [birth year] 54 y.o.  [name] is managed by Dr. Nicholas Lose  Actively treated with chemotherapy/immunotherapy/hormonal therapy: yes  Current therapy: Carboplatin, docetaxel, epratuzumab, Ogivri with Fulphila support.  Last treated: 11/25/2019 (cycle 5, day 1)  Next scheduled appointment with provider: 12/16/2019  Assessment: Plan:    Dehydration - Plan: 0.9 %  sodium chloride infusion  Diarrhea, unspecified type - Plan: Magnesium, diphenoxylate-atropine (LOMOTIL) 2.5-0.025 MG tablet  Port-A-Cath in place - Plan: heparin lock flush 100 unit/mL, sodium chloride flush (NS) 0.9 % injection 10 mL  Malignant neoplasm of upper-outer quadrant of right breast in female, estrogen receptor positive ([place]) - Plan: heparin lock flush 100 unit/mL, sodium chloride flush (NS) 0.9 % injection 10 mL  Hypokalemia  Gastroesophageal reflux disease, unspecified whether esophagitis present - Plan: nizatidine (AXID) 150 MG capsule  Mucositis oral - Plan: lidocaine (XYLOCAINE) 2 % solution   Dehydration: The patient was given 1 L normal saline IV.  Diarrhea: Patient's magnesium level returned at 1.6 today.  She was given 2 g of magnesium in 1 L of normal saline over 1 hour.  Additionally she was given Lomotil.  ER positive malignant neoplasm of the right breast: Stephanie Brewer continues to be managed by Dr. Nicholas Lose and is status post cycle 5, day 1 of carboplatin, docetaxel, epratuzumab, Ogivri with Fulphila support which was dosed on 11/25/2019.  She is scheduled to be seen in follow-up on 12/16/2019.  Hypokalemia.  The patient's labs returned today showing a potassium of 3 0.0.  She was instructed to double up on her potassium 20 mEq once daily until her return.  Her labs will be rechecked on her return.  GERD: Patient was given a prescription for Axid 150 mg once daily.  Mucositis: Patient was given a prescription for  viscous lidocaine to use to swish and spit.  Please see After Visit Summary for patient specific instructions.  Future Appointments  Date Time Provider [place]  12/16/2019  7:45 AM [facility] None  12/16/2019  8:00 AM [facility] [place] FLUSH [facility] None  12/16/2019  8:30 AM Nicholas Lose, MD [facility] None  12/16/2019  9:00 AM [facility] INFUSION [facility] None  12/16/2019 10:30 AM Jennet Maduro, RD [facility] None  12/18/2019  9:00 AM [facility] [place] FLUSH [facility] None  12/21/2019 12:00 PM [facility] 1 [facility] [place]  01/06/2020  7:30 AM [facility] LAB [facility] None  01/06/2020  8:00 AM [facility] [place] FLUSH [facility] None  01/06/2020  8:15 AM Nicholas Lose, MD [facility] None  01/06/2020  9:00 AM [facility] INFUSION [facility] None  01/27/2020  1:45 PM [facility] LAB [facility] None  01/27/2020  2:00 PM [facility] [place] FLUSH [facility] None  01/27/2020  2:30 PM Nicholas Lose, MD [facility] None  01/27/2020  2:45 PM Gwyndolyn Kaufman, RN [facility] None  01/27/2020  3:15 PM [facility] INFUSION [facility] None  02/17/2020  9:00 AM [facility] INFUSION [facility] None  03/09/2020  7:30 AM [facility] LAB [facility] None  03/09/2020  8:00 AM [facility] [place] FLUSH [facility] None  03/09/2020  8:15 AM Nicholas Lose, MD [facility] None  03/09/2020  9:00 AM [facility] INFUSION [facility] None  03/30/2020  9:00 AM [facility] INFUSION [facility] None    Orders Placed This Encounter  Procedures  . Magnesium       Subjective:   Patient ID:  Stephanie Brewer is a 23 y.o. (DOB 1965-11-02) female.  Chief Complaint:  Chief Complaint  Patient presents with  .  Nausea  . Diarrhea  . Dehydration    Diarrhea  Pertinent negatives include no abdominal pain, chills, coughing, fever or vomiting.   Stephanie Brewer  is a 54 y.o. female with a diagnosis of an ER positive malignant neoplasm of the right breast.  She is followed by Dr. Nicholas Lose and is status  post cycle 5, day 1 of carboplatin, docetaxel, epratuzumab, Ogivri with Fulphila support which was dosed on 11/25/2019.   Stephanie Brewer presents to the office today with diarrhea, mouth sores, fatigue, weakness, dizziness, and anorexia.  She denies fevers, chills, or sweats.  Medications: I have reviewed the patient's current medications.  Allergies:  Allergies  Allergen Reactions  . Hyoscyamine Anaphylaxis  . Caffeine     Rapid heart rate.  . Doxycycline     Racing heart.  Marland Kitchen Nexium [Esomeprazole]     Swollen throat    Past Medical History:  Diagnosis Date  . DVT (deep venous thrombosis) (HCC)    left leg knee and ankle     Past Surgical History:  Procedure Laterality Date  . BREAST SURGERY     Augmentation 2003  . HERNIA REPAIR  2002  . PORTACATH PLACEMENT N/A 09/01/2019   Procedure: INSERTION PORT-A-CATH WITH ULTRASOUND GUIDANCE;  Surgeon: Rolm Bookbinder, MD;  Location: [facility];  Service: General;  Laterality: N/A;    Family History  Problem Relation Age of Onset  . Hypertension Father     Social History   Socioeconomic History  . Marital status: Married    Spouse name: Not on file  . Number of children: 2  . Years of education: some college  . Highest education level: Not on file  Occupational History  . Occupation: [employer]ist, newspaper  Tobacco Use  . Smoking status: Never Smoker  . Smokeless tobacco: Never Used  Vaping Use  . Vaping Use: Never used  Substance and Sexual Activity  . Alcohol use: Yes    Comment: rare-twice monthly or less  . Drug use: No  . Sexual activity: Yes    Birth control/protection: I.U.D.  Other Topics Concern  . Not on file  Social History Narrative   Lives with spouse   Caffeine use: no caffeine    Left handed    Social Determinants of Health   Financial Resource Strain:   . Difficulty of Paying Living Expenses: Not on file  Food Insecurity:   . Worried About Charity fundraiser in the Last Year: Not on  file  . Ran Out of Food in the Last Year: Not on file  Transportation Needs:   . Lack of Transportation (Medical): Not on file  . Lack of Transportation (Non-Medical): Not on file  Physical Activity:   . Days of Exercise per Week: Not on file  . Minutes of Exercise per Session: Not on file  Stress:   . Feeling of Stress : Not on file  Social Connections:   . Frequency of Communication with Friends and Family: Not on file  . Frequency of Social Gatherings with Friends and Family: Not on file  . Attends Religious Services: Not on file  . Active Member of Clubs or Organizations: Not on file  . Attends Archivist Meetings: Not on file  . Marital Status: Not on file  Intimate Partner Violence:   . Fear of Current or Ex-Partner: Not on file  . Emotionally Abused: Not on file  . Physically Abused: Not on file  . Sexually Abused:  Not on file    Past Medical History, Surgical history, Social history, and Family history were reviewed and updated as appropriate.   Please see review of systems for further details on the patient's review from today.   Review of Systems:  Review of Systems  Constitutional: Positive for appetite change and fatigue. Negative for chills, diaphoresis and fever.  HENT: Positive for mouth sores. Negative for trouble swallowing.   Respiratory: Negative for cough, chest tightness and shortness of breath.   Cardiovascular: Negative for chest pain, palpitations and leg swelling.  Gastrointestinal: Positive for diarrhea. Negative for abdominal distention, abdominal pain, blood in stool, constipation, nausea and vomiting.  Genitourinary: Negative for decreased urine volume and difficulty urinating.  Neurological: Positive for dizziness and weakness.    Objective:   Physical Exam:  BP 123/77   Pulse 78   Temp (!) 97.5 F (36.4 C) (Axillary) Comment: notified nurse  Resp 20   Ht 5\' 4"  (1.626 m)   Wt 170 lb 4.8 oz (77.2 kg)   LMP 05/03/2017   SpO2 100%    BMI 29.23 kg/m  ECOG: 1  Physical Exam Constitutional:      General: She is not in acute distress.    Appearance: She is not diaphoretic.  HENT:     Head: Normocephalic and atraumatic.     Mouth/Throat:     Mouth: Mucous membranes are moist.     Pharynx: No oropharyngeal exudate or posterior oropharyngeal erythema.     Comments: Scattered small superficial areas of ulceration. Eyes:     General: No scleral icterus.       Right eye: No discharge.        Left eye: No discharge.     Conjunctiva/sclera: Conjunctivae normal.  Cardiovascular:     Rate and Rhythm: Normal rate and regular rhythm.     Heart sounds: Normal heart sounds. No murmur heard.  No friction rub. No gallop.   Pulmonary:     Effort: Pulmonary effort is normal. No respiratory distress.     Breath sounds: Normal breath sounds. No wheezing or rales.  Abdominal:     General: Bowel sounds are normal. There is no distension.     Tenderness: There is no abdominal tenderness. There is no guarding or rebound.  Skin:    General: Skin is warm and dry.     Findings: No erythema or rash.  Neurological:     Mental Status: She is alert.     Coordination: Coordination normal.     Gait: Gait normal.  Psychiatric:        Mood and Affect: Mood normal.        Behavior: Behavior normal.        Thought Content: Thought content normal.        Judgment: Judgment normal.     Lab Review:     Component Value Date/Time   NA 135 12/04/2019 1355   NA 140 07/20/2019 1405   K 3.0 (LL) 12/04/2019 1355   CL 98 12/04/2019 1355   CO2 28 12/04/2019 1355   GLUCOSE 102 (H) 12/04/2019 1355   BUN 11 12/04/2019 1355   BUN 10 07/20/2019 1405   CREATININE 0.77 12/04/2019 1355   CALCIUM 9.2 12/04/2019 1355   PROT 7.0 12/04/2019 1355   PROT 7.4 07/20/2019 1405   ALBUMIN 3.9 12/04/2019 1355   ALBUMIN 4.4 07/20/2019 1405   AST 49 (H) 12/04/2019 1355   ALT 56 (H) 12/04/2019 1355   ALKPHOS 131 (H)  12/04/2019 1355   BILITOT 0.4  12/04/2019 1355   GFRNONAA >60 12/04/2019 1355   GFRAA >60 12/04/2019 1355       Component Value Date/Time   WBC 13.2 (H) 12/04/2019 1355   RBC 3.81 (L) 12/04/2019 1355   HGB 11.6 (L) 12/04/2019 1355   HGB 13.5 07/20/2019 1405   HCT 34.9 (L) 12/04/2019 1355   HCT 40.5 07/20/2019 1405   PLT 241 12/04/2019 1355   PLT 365 07/20/2019 1405   MCV 91.6 12/04/2019 1355   MCV 84 07/20/2019 1405   MCH 30.4 12/04/2019 1355   MCHC 33.2 12/04/2019 1355   RDW 16.4 (H) 12/04/2019 1355   RDW 12.9 07/20/2019 1405   LYMPHSABS 1.9 12/04/2019 1355   LYMPHSABS 1.2 12/24/2017 0843   MONOABS 1.9 (H) 12/04/2019 1355   EOSABS 0.0 12/04/2019 1355   EOSABS 0.2 12/24/2017 0843   BASOSABS 0.1 12/04/2019 1355   BASOSABS 0.1 12/24/2017 0843   -------------------------------  Imaging from last 24 hours (if applicable):  Radiology interpretation: ECHOCARDIOGRAM COMPLETE  Result Date: 12/01/2019    ECHOCARDIOGRAM REPORT   Patient Name:   Stephanie Brewer Date of Exam: 12/01/2019 Medical Rec #:  [id number]      Height:       64.0 in Accession #:    [id number]     Weight:       180.6 lb Date of Birth:  May 08, 1965      BSA:          1.873 m Patient Age:    69 years       BP:           120/78 mmHg Patient Gender: F              HR:           101 bpm. Exam Location:  Outpatient Procedure: 2D Echo, 3D Echo, Cardiac Doppler, Color Doppler and Strain Analysis Indications:    Z51.11 Encounter for antineoplastic chemotheraphy  History:        Patient has prior history of Echocardiogram examinations, most                 recent 08/25/2019. DVT. Breast Cancer.  Sonographer:    Jonelle Sidle Dance Referring Phys: [id number] LINDSEY CORNETTO CAUSEY  Sonographer Comments: Image acquisition challenging due to breast implants. IMPRESSIONS  1. Normal GLS -16.5 EF remains normal GLS previously recorded as -23 Aug 2019 . Left ventricular ejection fraction, by estimation, is 55%. The left ventricle has normal function. The left ventricle has no  regional wall motion abnormalities. Left ventricular diastolic parameters were normal.  2. Right ventricular systolic function is normal. The right ventricular size is normal.  3. The mitral valve is normal in structure. Trivial mitral valve regurgitation. No evidence of mitral stenosis.  4. The aortic valve was not well visualized. Aortic valve regurgitation is not visualized. No aortic stenosis is present.  5. The inferior vena cava is normal in size with greater than 50% respiratory variability, suggesting right atrial pressure of 3 mmHg. FINDINGS  Left Ventricle: Normal GLS -16.5 EF remains normal GLS previously recorded as -23 Aug 2019. Left ventricular ejection fraction, by estimation, is 55%. The left ventricle has normal function. The left ventricle has no regional wall motion abnormalities. The left ventricular internal cavity size was normal in size. There is no left ventricular hypertrophy. Left ventricular diastolic parameters were normal. Right Ventricle: The right ventricular size is normal. No increase in right ventricular wall thickness.  Right ventricular systolic function is normal. Left Atrium: Left atrial size was normal in size. Right Atrium: Right atrial size was normal in size. Pericardium: There is no evidence of pericardial effusion. Mitral Valve: The mitral valve is normal in structure. There is mild thickening of the mitral valve leaflet(s). There is moderate calcification of the mitral valve leaflet(s). Normal mobility of the mitral valve leaflets. Trivial mitral valve regurgitation. No evidence of mitral valve stenosis. Tricuspid Valve: The tricuspid valve is normal in structure. Tricuspid valve regurgitation is not demonstrated. No evidence of tricuspid stenosis. Aortic Valve: The aortic valve was not well visualized. Aortic valve regurgitation is not visualized. No aortic stenosis is present. Pulmonic Valve: The pulmonic valve was normal in structure. Pulmonic valve regurgitation is not  visualized. No evidence of pulmonic stenosis. Aorta: The aortic root is normal in size and structure. Venous: The inferior vena cava is normal in size with greater than 50% respiratory variability, suggesting right atrial pressure of 3 mmHg. IAS/Shunts: No atrial level shunt detected by color flow Doppler.  LEFT VENTRICLE PLAX 2D LVIDd:         3.30 cm  Diastology LVIDs:         2.30 cm  LV e' lateral:   6.42 cm/s LV PW:         1.20 cm  LV E/e' lateral: 7.1 LV IVS:        1.00 cm  LV e' medial:    7.72 cm/s LVOT diam:     2.00 cm  LV E/e' medial:  5.9 LV SV:         46 LV SV Index:   25 LVOT Area:     3.14 cm                          3D Volume EF:                         3D EF:        48 %                         LV EDV:       96 ml                         LV ESV:       50 ml                         LV SV:        46 ml RIGHT VENTRICLE            IVC RV Basal diam:  2.00 cm    IVC diam: 1.20 cm RV S prime:     8.16 cm/s TAPSE (M-mode): 1.2 cm LEFT ATRIUM             Index       RIGHT ATRIUM          Index LA diam:        3.30 cm 1.76 cm/m  RA Area:     8.55 cm LA Vol (A2C):   31.6 ml 16.87 ml/m RA Volume:   16.70 ml 8.91 ml/m LA Vol (A4C):   18.7 ml 9.98 ml/m LA Biplane Vol: 24.3 ml 12.97 ml/m  AORTIC VALVE LVOT Vmax:   89.70 cm/s LVOT Vmean:  59.300 cm/s  LVOT VTI:    0.147 m  AORTA Ao Root diam: 3.00 cm Ao Asc diam:  2.60 cm MITRAL VALVE MV Area (PHT): 3.12 cm    SHUNTS MV Decel Time: 243 msec    Systemic VTI:  0.15 m MV E velocity: 45.30 cm/s  Systemic Diam: 2.00 cm MV A velocity: 67.00 cm/s MV E/A ratio:  0.68 Jenkins Rouge MD Electronically signed by Jenkins Rouge MD Signature Date/Time: 12/01/2019/11:14:32 AM    Final

## 2019-12-11 NOTE — Progress Notes (Signed)
These results were reviewed with the patient.

## 2019-12-15 NOTE — Progress Notes (Signed)
Patient Care Team: [name], DO as PCP - General (Family Medicine) Mauro Kaufmann, RN as Oncology Nurse Navigator Rockwell Germany, RN as Oncology Nurse Navigator Gwyndolyn Kaufman, RN as Registered Nurse Gwyndolyn Kaufman, RN as Registered Nurse  DIAGNOSIS:    ICD-10-CM   1. Malignant neoplasm of upper-outer quadrant of right breast in female, estrogen receptor positive ([place])  C50.411    Z17.0     SUMMARY OF ONCOLOGIC HISTORY: Oncology History  Malignant neoplasm of upper-outer quadrant of right breast in female, estrogen receptor positive ([place])  07/28/2019 Initial Diagnosis   Screening mammogram detected right breast mass 11:30 position subareolar 1.3 cm, indeterminate 5 mm mass: Biopsy fibroadenoma, right axillary lymph node present, biopsy of the lymph node and the mass revealed grade 2 IDC ER 10%, PR 0%, Ki-67 45%, HER-2 +3+ by [facility]   08/20/2019 Cancer Staging   Staging form: Breast, AJCC 8th Edition - Clinical stage from 08/20/2019: Stage IIA (cT1c, cN1, cM0, G2, ER+, PR-, HER2+) - Signed by Nicholas Lose, MD on 08/20/2019   09/02/2019 -  Chemotherapy   The patient had dexamethasone (DECADRON) 4 MG tablet, 4 mg (100 % of original dose 4 mg), Oral, Daily, 1 of 1 cycle, Start date: 08/20/2019, End date: -- Dose modification: 4 mg (original dose 4 mg, Cycle 0) palonosetron (ALOXI) injection 0.25 mg, 0.25 mg, Intravenous,  Once, 5 of 6 cycles Administration: 0.25 mg (09/02/2019), 0.25 mg (09/23/2019), 0.25 mg (11/25/2019), 0.25 mg (10/14/2019), 0.25 mg (11/04/2019) pegfilgrastim-jmdb (FULPHILA) injection 6 mg, 6 mg, Subcutaneous,  Once, 5 of 6 cycles Administration: 6 mg (09/04/2019), 6 mg (09/25/2019), 6 mg (11/27/2019), 6 mg (10/16/2019), 6 mg (11/06/2019) CARBOplatin (PARAPLATIN) 700 mg in sodium chloride 0.9 % 250 mL chemo infusion, 700 mg (100 % of original dose 700 mg), Intravenous,  Once, 5 of 6 cycles Dose modification: 700 mg (original dose 700 mg, Cycle 1), 700 mg (original  dose 700 mg, Cycle 5), 600 mg (original dose 700 mg, Cycle 5, Reason: Other (see comments), Comment: dose reduce to 600 mg for CINV per [name]) Administration: 700 mg (09/02/2019), 700 mg (09/23/2019), 600 mg (11/25/2019), 700 mg (10/14/2019), 600 mg (11/04/2019) DOCEtaxel (TAXOTERE) 150 mg in sodium chloride 0.9 % 250 mL chemo infusion, 75 mg/m2 = 150 mg, Intravenous,  Once, 5 of 6 cycles Dose modification: 50 mg/m2 (original dose 75 mg/m2, Cycle 5, Reason: Dose not tolerated), 50 mg/m2 (original dose 75 mg/m2, Cycle 4, Reason: Dose not tolerated) Administration: 150 mg (09/02/2019), 150 mg (09/23/2019), 100 mg (11/25/2019), 150 mg (10/14/2019), 100 mg (11/04/2019) fosaprepitant (EMEND) 150 mg in sodium chloride 0.9 % 145 mL IVPB, 150 mg, Intravenous,  Once, 5 of 6 cycles Administration: 150 mg (09/02/2019), 150 mg (09/23/2019), 150 mg (11/25/2019), 150 mg (10/14/2019), 150 mg (11/04/2019) pertuzumab (PERJETA) 420 mg in sodium chloride 0.9 % 250 mL chemo infusion, 420 mg (100 % of original dose 420 mg), Intravenous, Once, 5 of 6 cycles Dose modification: 420 mg (original dose 420 mg, Cycle 1, Reason: Provider Judgment) Administration: 420 mg (09/02/2019), 420 mg (09/23/2019), 420 mg (11/25/2019), 420 mg (10/14/2019), 420 mg (11/04/2019) trastuzumab-dkst (OGIVRI) 672 mg in sodium chloride 0.9 % 250 mL chemo infusion, 8 mg/kg = 672 mg, Intravenous,  Once, 5 of 6 cycles Administration: 672 mg (09/02/2019), 504 mg (09/23/2019), 504 mg (11/25/2019), 504 mg (10/14/2019), 504 mg (11/04/2019)  for chemotherapy treatment.      CHIEF COMPLIANT: Cycle6TCH Perjeta  INTERVAL HISTORY: Stephanie Brewer is a  54 y.o. with above-mentioned history of right breast cancer currently on neoadjuvant chemotherapy with TCH Perjeta.Echo on 12/01/19 showed an ejection fraction of 55%. She presents to the clinic todayfora toxicity checkandcycle6.   She has gotten profound diarrhea with the treatment and basically a week after chemo she is dehydrated.   She requires IV fluids.  She does not eat or drink a whole lot for a week.  Then her appetite and taste picks up when she eats better.  She is also feeling very nauseated after each treatment that last for about a week.  She is taking Pepcid for acid reflux.  ALLERGIES:  is allergic to hyoscyamine, caffeine, doxycycline, and nexium [esomeprazole].  MEDICATIONS:  Current Outpatient Medications  Medication Sig Dispense Refill  . ALLERGY CHILDRENS 12.5 MG/5ML liquid SWISH & SPIT 1WTEASPOONFUL 4 TIMES A DAYTAS NEEDED FOR MOUTH PAIN    . CORTEF 10 MG tablet Take by mouth.    . dexamethasone (DECADRON) 4 MG tablet Take 1 tablet (4 mg total) by mouth daily. Take 1 tablet day before chemo and 1 tablet day after chemo with food 12 tablet 0  . diphenoxylate-atropine (LOMOTIL) 2.5-0.025 MG tablet 1 to 2 four times daily as needed for diarrhea 60 tablet 3  . furosemide (LASIX) 20 MG tablet Take 1 tablet (20 mg total) by mouth 2 (two) times daily. 60 tablet 0  . lidocaine (XYLOCAINE) 2 % solution SWISH & SPIT 1WTEASPOONFUL 4 TIMES A DAYTAS NEEDED FOR MOUTH PAIN    . lidocaine (XYLOCAINE) 2 % solution Use as directed 5 mLs in the mouth or throat as needed for mouth pain. 200 mL 1  . lidocaine-prilocaine (EMLA) cream Apply to affected area once 30 g 3  . LORazepam (ATIVAN) 0.5 MG tablet Take 1 tablet (0.5 mg total) by mouth at bedtime as needed for sleep. 30 tablet 0  . magic mouthwash w/lidocaine SOLN Take 5 mLs by mouth 4 (four) times daily as needed for mouth pain. 120 mL 0  . Minocycline HCl 90 MG TB24 Take 1 tablet by mouth daily. Only takes prn for break outs    . nizatidine (AXID) 150 MG capsule Take 1 capsule (150 mg total) by mouth 2 (two) times daily. (Patient not taking: Reported on 12/10/2019) 60 capsule 2  . nystatin (MYCOSTATIN) 100000 UNIT/ML suspension SMARTSIG:1 Teaspoon By Mouth 4 Times Daily PRN    . ondansetron (ZOFRAN) 8 MG tablet Take 1 tablet (8 mg total) by mouth 2 (two) times daily as  needed (Nausea or vomiting). Begin 4 days after chemotherapy. 30 tablet 1  . potassium chloride SA (KLOR-CON) 20 MEQ tablet Take 1 tablet (20 mEq total) by mouth 2 (two) times daily. 60 tablet 0  . prochlorperazine (COMPAZINE) 10 MG tablet Take 1 tablet (10 mg total) by mouth every 6 (six) hours as needed (Nausea or vomiting). 30 tablet 1  . rivaroxaban (XARELTO) 20 MG TABS tablet Take 1 tablet (20 mg total) by mouth daily with supper. x5 months. Then stop. 30 tablet 4  . saccharomyces boulardii (FLORASTOR) 250 MG capsule Take 250 mg by mouth daily.     No current facility-administered medications for this visit.   Facility-Administered Medications Ordered in Other Visits  Medication Dose Route Frequency Provider Last Rate Last Admin  . sodium chloride flush (NS) 0.9 % injection 10 mL  10 mL Intravenous PRN Serena Croissant, MD   10 mL at 09/02/19 0839    PHYSICAL EXAMINATION: ECOG PERFORMANCE STATUS: 1 - Symptomatic but completely  ambulatory  Vitals:   12/16/19 0824  BP: 130/85  Pulse: 79  Resp: 17  Temp: (!) 97.5 F (36.4 C)  SpO2: 100%   Filed Weights   12/16/19 0824  Weight: 179 lb 14.4 oz (81.6 kg)    LABORATORY DATA:  I have reviewed the data as listed CMP Latest Ref Rng & Units 12/04/2019 11/25/2019 11/13/2019  Glucose 70 - 99 mg/dL 102(H) 119(H) 100(H)  BUN 6 - 20 mg/dL _0 Creatinine 0.44 - 1.00 mg/dL 0.77 0.65 0.81  Sodium 135 - 145 mmol/L 135 139 137  Potassium 3.5 - 5.1 mmol/L 3.0(LL) 3.7 3.2(L)  Chloride 98 - 111 mmol/L 98 108 101  CO2 22 - 32 mmol/L _1 Calcium 8.9 - 10.3 mg/dL 9.2 9.5 9.6  Total Protein 6.5 - 8.1 g/dL 7.0 6.2(L) 6.9  Total Bilirubin 0.3 - 1.2 mg/dL 0.4 0.4 0.3  Alkaline Phos 38 - 126 U/L 131(H) 93 144(H)  AST 15 - 41 U/L 49(H) 36 37  ALT 0 - 44 U/L 56(H) 39 55(H)    Lab Results  Component Value Date   WBC 7.0 12/16/2019   HGB 9.5 (L) 12/16/2019   HCT 29.8 (L) 12/16/2019   MCV 94.6 12/16/2019   PLT 176 12/16/2019   NEUTROABS 4.7  12/16/2019    ASSESSMENT & PLAN:  Malignant neoplasm of upper-outer quadrant of right breast in female, estrogen receptor positive ([place]) /27/2021:Screening mammogram detected right breast mass 11:30 position subareolar 1.3 cm, indeterminate 5 mm mass: Biopsy fibroadenoma, right axillary lymph node present, biopsy of the lymph node and the mass revealed grade 2 IDC ER 10%, PR 0%, Ki-67 45%, HER-2 +3+ by IHC T1 cN1 M0 stage IIa  Treatment plan: 1. Neoadjuvant chemotherapy with TCH Perjeta 6 cycles followed by HerceptinPerjeta versus Kadcylamaintenance for 1 year 2. Followed by breast conserving surgery with sentinel lymph node study 3. Followed by adjuvant radiation therapy if patient had lumpectomy  Breast MRI: 3.8 cm tumor and 1 axillary lymphnode. ---------------------------------------------------------------------------------------------------------------------------------------------------- Current treatment:Cycle6day 1TCH Perjeta (perjeta discontinued due to diarrhea) Echocardiogram EF 60 to 65% 08/25/2019  Chemo toxicities: Fatigue: managing with activity and energy conservation Diarrhea: In spite of Lomotil and Imodium continues to have diarrhea.  We are giving IV fluids after each treatment.  She will come back next Friday for IV fluids.  We will discontinue Perjeta today. Leg swelling: increased lasix to BID, refilled her potassium  hypokalemia: Monitoring  She has appts for MRI and surgery consult Plan HP maintenance vs Kadcyla Vs COMPASS HER 2 RTC after surgery We made appointments for maintenance therapy.  She will not get Perjeta maintenance.    No orders of the defined types were placed in this encounter.  The patient has a good understanding of the overall plan. she agrees with it. she will call with any problems that may develop before the next visit here.  Total time spent: 30 mins including face to face time and time spent for planning, charting and  coordination of care  Nicholas Lose, MD 12/16/2019  I, Cloyde Reams Dorshimer, am acting as scribe for Dr. Nicholas Lose.  I have reviewed the above documentation for accuracy and completeness, and I agree with the above.

## 2019-12-16 ENCOUNTER — Inpatient Hospital Stay: Payer: BC Managed Care – PPO

## 2019-12-16 ENCOUNTER — Inpatient Hospital Stay (HOSPITAL_BASED_OUTPATIENT_CLINIC_OR_DEPARTMENT_OTHER): Payer: BC Managed Care – PPO | Admitting: Hematology and Oncology

## 2019-12-16 ENCOUNTER — Encounter: Payer: Self-pay | Admitting: *Deleted

## 2019-12-16 ENCOUNTER — Other Ambulatory Visit: Payer: Self-pay

## 2019-12-16 DIAGNOSIS — C773 Secondary and unspecified malignant neoplasm of axilla and upper limb lymph nodes: Secondary | ICD-10-CM | POA: Diagnosis not present

## 2019-12-16 DIAGNOSIS — C50411 Malignant neoplasm of upper-outer quadrant of right female breast: Secondary | ICD-10-CM

## 2019-12-16 DIAGNOSIS — E876 Hypokalemia: Secondary | ICD-10-CM | POA: Diagnosis not present

## 2019-12-16 DIAGNOSIS — Z170 Estrogen receptor positive status [ER+]: Secondary | ICD-10-CM

## 2019-12-16 DIAGNOSIS — Z95828 Presence of other vascular implants and grafts: Secondary | ICD-10-CM

## 2019-12-16 DIAGNOSIS — K219 Gastro-esophageal reflux disease without esophagitis: Secondary | ICD-10-CM | POA: Diagnosis not present

## 2019-12-16 DIAGNOSIS — Z5189 Encounter for other specified aftercare: Secondary | ICD-10-CM | POA: Diagnosis not present

## 2019-12-16 DIAGNOSIS — K1230 Oral mucositis (ulcerative), unspecified: Secondary | ICD-10-CM | POA: Diagnosis not present

## 2019-12-16 DIAGNOSIS — E860 Dehydration: Secondary | ICD-10-CM | POA: Diagnosis not present

## 2019-12-16 DIAGNOSIS — R197 Diarrhea, unspecified: Secondary | ICD-10-CM | POA: Diagnosis not present

## 2019-12-16 DIAGNOSIS — Z5111 Encounter for antineoplastic chemotherapy: Secondary | ICD-10-CM | POA: Diagnosis not present

## 2019-12-16 LAB — CMP (CANCER CENTER ONLY)
ALT: 17 U/L (ref 0–44)
AST: 20 U/L (ref 15–41)
Albumin: 3.2 g/dL — ABNORMAL LOW (ref 3.5–5.0)
Alkaline Phosphatase: 94 U/L (ref 38–126)
Anion gap: 7 (ref 5–15)
BUN: 11 mg/dL (ref 6–20)
CO2: 25 mmol/L (ref 22–32)
Calcium: 8.7 mg/dL — ABNORMAL LOW (ref 8.9–10.3)
Chloride: 107 mmol/L (ref 98–111)
Creatinine: 0.63 mg/dL (ref 0.44–1.00)
GFR, Est AFR Am: >60 mL/min (ref >60)
GFR, Estimated: >60 mL/min (ref >60)
Glucose, Bld: 116 mg/dL — ABNORMAL HIGH (ref 70–99)
Potassium: 3.6 mmol/L (ref 3.5–5.1)
Sodium: 139 mmol/L (ref 135–145)
Total Bilirubin: 0.3 mg/dL (ref 0.3–1.2)
Total Protein: 6 g/dL — ABNORMAL LOW (ref 6.5–8.1)

## 2019-12-16 LAB — CBC WITH DIFFERENTIAL (CANCER CENTER ONLY)
Abs Immature Granulocytes: 0.06 10*3/uL (ref 0.00–0.07)
Basophils Absolute: 0.1 10*3/uL (ref 0.0–0.1)
Basophils Relative: 1 %
Eosinophils Absolute: 0 10*3/uL (ref 0.0–0.5)
Eosinophils Relative: 0 %
HCT: 29.8 % — ABNORMAL LOW (ref 36.0–46.0)
Hemoglobin: 9.5 g/dL — ABNORMAL LOW (ref 12.0–15.0)
Immature Granulocytes: 1 %
Lymphocytes Relative: 20 %
Lymphs Abs: 1.4 10*3/uL (ref 0.7–4.0)
MCH: 30.2 pg (ref 26.0–34.0)
MCHC: 31.9 g/dL (ref 30.0–36.0)
MCV: 94.6 fL (ref 80.0–100.0)
Monocytes Absolute: 0.8 10*3/uL (ref 0.1–1.0)
Monocytes Relative: 12 %
Neutro Abs: 4.7 10*3/uL (ref 1.7–7.7)
Neutrophils Relative %: 66 %
Platelet Count: 176 10*3/uL (ref 150–400)
RBC: 3.15 MIL/uL — ABNORMAL LOW (ref 3.87–5.11)
RDW: 16.7 % — ABNORMAL HIGH (ref 11.5–15.5)
WBC Count: 7 10*3/uL (ref 4.0–10.5)
nRBC: 0 % (ref 0.0–0.2)

## 2019-12-16 MED ORDER — SODIUM CHLORIDE 0.9 % IV SOLN
150.0000 mg | Freq: Once | INTRAVENOUS | Status: AC
  Administered 2019-12-16: 150 mg via INTRAVENOUS
  Filled 2019-12-16: qty 150

## 2019-12-16 MED ORDER — SODIUM CHLORIDE 0.9 % IV SOLN
Freq: Once | INTRAVENOUS | Status: AC
  Filled 2019-12-16: qty 250

## 2019-12-16 MED ORDER — DIPHENHYDRAMINE HCL 25 MG PO CAPS
ORAL_CAPSULE | ORAL | Status: AC
  Filled 2019-12-16: qty 2

## 2019-12-16 MED ORDER — SODIUM CHLORIDE 0.9% FLUSH
10.0000 mL | INTRAVENOUS | Status: DC | PRN
  Filled 2019-12-16: qty 10

## 2019-12-16 MED ORDER — SODIUM CHLORIDE 0.9 % IV SOLN
10.0000 mg | Freq: Once | INTRAVENOUS | Status: AC
  Administered 2019-12-16: 10 mg via INTRAVENOUS
  Filled 2019-12-16: qty 10

## 2019-12-16 MED ORDER — SODIUM CHLORIDE 0.9% FLUSH
10.0000 mL | Freq: Once | INTRAVENOUS | Status: AC
  Administered 2019-12-16: 10 mL
  Filled 2019-12-16: qty 10

## 2019-12-16 MED ORDER — DIPHENHYDRAMINE HCL 25 MG PO CAPS
50.0000 mg | ORAL_CAPSULE | Freq: Once | ORAL | Status: AC
  Administered 2019-12-16: 50 mg via ORAL

## 2019-12-16 MED ORDER — SODIUM CHLORIDE 0.9 % IV SOLN
600.0000 mg | Freq: Once | INTRAVENOUS | Status: AC
  Administered 2019-12-16: 600 mg via INTRAVENOUS
  Filled 2019-12-16: qty 60

## 2019-12-16 MED ORDER — PALONOSETRON HCL INJECTION 0.25 MG/5ML
0.2500 mg | Freq: Once | INTRAVENOUS | Status: AC
  Administered 2019-12-16: 0.25 mg via INTRAVENOUS

## 2019-12-16 MED ORDER — HEPARIN SOD (PORK) LOCK FLUSH 100 UNIT/ML IV SOLN
500.0000 [IU] | Freq: Once | INTRAVENOUS | Status: DC | PRN
  Filled 2019-12-16: qty 5

## 2019-12-16 MED ORDER — ACETAMINOPHEN 325 MG PO TABS
650.0000 mg | ORAL_TABLET | Freq: Once | ORAL | Status: AC
  Administered 2019-12-16: 650 mg via ORAL

## 2019-12-16 MED ORDER — SODIUM CHLORIDE 0.9 % IV SOLN
50.0000 mg/m2 | Freq: Once | INTRAVENOUS | Status: AC
  Administered 2019-12-16: 100 mg via INTRAVENOUS
  Filled 2019-12-16: qty 10

## 2019-12-16 MED ORDER — PALONOSETRON HCL INJECTION 0.25 MG/5ML
INTRAVENOUS | Status: AC
  Filled 2019-12-16: qty 5

## 2019-12-16 MED ORDER — TRASTUZUMAB-DKST CHEMO 150 MG IV SOLR
6.0000 mg/kg | Freq: Once | INTRAVENOUS | Status: AC
  Administered 2019-12-16: 504 mg via INTRAVENOUS
  Filled 2019-12-16: qty 24

## 2019-12-16 MED ORDER — ACETAMINOPHEN 325 MG PO TABS
ORAL_TABLET | ORAL | Status: AC
  Filled 2019-12-16: qty 2

## 2019-12-16 NOTE — Patient Instructions (Signed)
[facility] Discharge Instructions for Patients Receiving Chemotherapy  Today you received the following chemotherapy agents: trastuzumab, taxotere, carboplatin  To help prevent nausea and vomiting after your treatment, we encourage you to take your nausea medication as directed.   If you develop nausea and vomiting that is not controlled by your nausea medication, call the clinic.   BELOW ARE SYMPTOMS THAT SHOULD BE REPORTED IMMEDIATELY:  *FEVER GREATER THAN 100.5 F  *CHILLS WITH OR WITHOUT FEVER  NAUSEA AND VOMITING THAT IS NOT CONTROLLED WITH YOUR NAUSEA MEDICATION  *UNUSUAL SHORTNESS OF BREATH  *UNUSUAL BRUISING OR BLEEDING  TENDERNESS IN MOUTH AND THROAT WITH OR WITHOUT PRESENCE OF ULCERS  *URINARY PROBLEMS  *BOWEL PROBLEMS  UNUSUAL RASH Items with * indicate a potential emergency and should be followed up as soon as possible.  Feel free to call the clinic should you have any questions or concerns. The clinic phone number is [phone].  Please show the CHEMO ALERT CARD at check-in to the Emergency Department and triage nurse.   

## 2019-12-16 NOTE — Assessment & Plan Note (Signed)
/  27/2021:Screening mammogram detected right breast mass 11:30 position subareolar 1.3 cm, indeterminate 5 mm mass: Biopsy fibroadenoma, right axillary lymph node present, biopsy of the lymph node and the mass revealed grade 2 IDC ER 10%, PR 0%, Ki-67 45%, HER-2 +3+ by IHC T1 cN1 M0 stage IIa  Treatment plan: 1. Neoadjuvant chemotherapy with TCH Perjeta 6 cycles followed by HerceptinPerjeta versus Kadcylamaintenance for 1 year 2. Followed by breast conserving surgery with sentinel lymph node study 3. Followed by adjuvant radiation therapy if patient had lumpectomy  Breast MRI: 3.8 cm tumor and 1 axillary lymphnode. ---------------------------------------------------------------------------------------------------------------------------------------------------- Current treatment:Cycle6day 1TCH Perjeta (perjeta discontinued due to diarrhea) Echocardiogram EF 60 to 65% 08/25/2019  Chemo toxicities: Fatigue: managing with activity and energy conservation Diarrhea: Lomotil and intermittent IV fluids when needed Leg swelling: increased lasix to BID, refilled her potassium  She has appts for MRI and surgery consult Plan HP maintenance vs Kadcyla Vs COMPASS HER 2 RTC after surgery

## 2019-12-16 NOTE — Progress Notes (Signed)
Nutrition Follow-up:   Patient with right breast cancer.  Patient receiving neoadjuvant chemotherapy.    Met with patient during infusion.  Reports that during the week following chemotherapy appetite continues to be poor.  Able to drink liquids and did come in for fluids.  Planning fluids on Friday of this week.  Reports after that initial week appetite improves.  Ate taco salad last night for dinner and [place] sub for lunch yesterday.  Reports that she really likes flavored waters, craving fresh salads.      Medications: reviewed  Labs: reviewed  Anthropometrics:   Weight is 179 lb 14.4 oz today stable from 180 lb on 8/25   NUTRITION DIAGNOSIS: Inadequate oral intake continues    INTERVENTION:  Provided samples of ensure clear and discussed boost soothe and boost breeze especially during week of poor po intake     MONITORING, EVALUATION, GOAL: weight trends, intake   NEXT VISIT: Oct 6 during infusion  Hason Ofarrell B. Zenia Resides, [place], [place] Registered Dietitian [phone] (mobile)

## 2019-12-18 ENCOUNTER — Inpatient Hospital Stay: Payer: BC Managed Care – PPO

## 2019-12-18 ENCOUNTER — Other Ambulatory Visit: Payer: Self-pay

## 2019-12-18 VITALS — BP 133/85 | HR 72 | Temp 98.4°F | Resp 18

## 2019-12-18 DIAGNOSIS — K1230 Oral mucositis (ulcerative), unspecified: Secondary | ICD-10-CM | POA: Diagnosis not present

## 2019-12-18 DIAGNOSIS — Z5111 Encounter for antineoplastic chemotherapy: Secondary | ICD-10-CM | POA: Diagnosis not present

## 2019-12-18 DIAGNOSIS — K219 Gastro-esophageal reflux disease without esophagitis: Secondary | ICD-10-CM | POA: Diagnosis not present

## 2019-12-18 DIAGNOSIS — E876 Hypokalemia: Secondary | ICD-10-CM | POA: Diagnosis not present

## 2019-12-18 DIAGNOSIS — E860 Dehydration: Secondary | ICD-10-CM | POA: Diagnosis not present

## 2019-12-18 DIAGNOSIS — Z170 Estrogen receptor positive status [ER+]: Secondary | ICD-10-CM | POA: Diagnosis not present

## 2019-12-18 DIAGNOSIS — C773 Secondary and unspecified malignant neoplasm of axilla and upper limb lymph nodes: Secondary | ICD-10-CM | POA: Diagnosis not present

## 2019-12-18 DIAGNOSIS — C50411 Malignant neoplasm of upper-outer quadrant of right female breast: Secondary | ICD-10-CM | POA: Diagnosis not present

## 2019-12-18 DIAGNOSIS — Z5189 Encounter for other specified aftercare: Secondary | ICD-10-CM | POA: Diagnosis not present

## 2019-12-18 DIAGNOSIS — R197 Diarrhea, unspecified: Secondary | ICD-10-CM | POA: Diagnosis not present

## 2019-12-18 MED ORDER — PEGFILGRASTIM-JMDB 6 MG/0.6ML ~~LOC~~ SOSY
PREFILLED_SYRINGE | SUBCUTANEOUS | Status: AC
  Filled 2019-12-18: qty 0.6

## 2019-12-18 MED ORDER — PEGFILGRASTIM-JMDB 6 MG/0.6ML ~~LOC~~ SOSY
6.0000 mg | PREFILLED_SYRINGE | Freq: Once | SUBCUTANEOUS | Status: AC
  Administered 2019-12-18: 6 mg via SUBCUTANEOUS

## 2019-12-18 NOTE — Patient Instructions (Signed)

## 2019-12-21 ENCOUNTER — Other Ambulatory Visit: Payer: Self-pay

## 2019-12-21 ENCOUNTER — Ambulatory Visit (HOSPITAL_COMMUNITY)
Admission: RE | Admit: 2019-12-21 | Discharge: 2019-12-21 | Disposition: A | Discharge status: Home / Self Care | Payer: BC Managed Care – PPO | Source: Ambulatory Visit | Attending: Adult Health | Admitting: Adult Health

## 2019-12-21 DIAGNOSIS — C50912 Malignant neoplasm of unspecified site of left female breast: Secondary | ICD-10-CM | POA: Diagnosis not present

## 2019-12-21 DIAGNOSIS — C50411 Malignant neoplasm of upper-outer quadrant of right female breast: Secondary | ICD-10-CM | POA: Insufficient documentation

## 2019-12-21 DIAGNOSIS — Z170 Estrogen receptor positive status [ER+]: Secondary | ICD-10-CM | POA: Diagnosis not present

## 2019-12-21 DIAGNOSIS — C50911 Malignant neoplasm of unspecified site of right female breast: Secondary | ICD-10-CM | POA: Diagnosis not present

## 2019-12-21 MED ORDER — GADOBUTROL 1 MMOL/ML IV SOLN
8.0000 mL | Freq: Once | INTRAVENOUS | Status: AC | PRN
  Administered 2019-12-21: 8 mL via INTRAVENOUS

## 2019-12-22 ENCOUNTER — Encounter: Payer: Self-pay | Admitting: *Deleted

## 2019-12-22 ENCOUNTER — Telehealth: Payer: Self-pay | Admitting: *Deleted

## 2019-12-22 NOTE — Telephone Encounter (Signed)
Called pt with MRI results. Denies questions regarding the report. Informed pt that we will schedule her to see [name] 1 wks after sx as well get her in to rad onc. Pt wishes to come to [facility]. Denies further needs or questions at this time.

## 2019-12-24 ENCOUNTER — Other Ambulatory Visit: Payer: Self-pay | Admitting: Emergency Medicine

## 2019-12-24 DIAGNOSIS — E860 Dehydration: Secondary | ICD-10-CM

## 2019-12-25 ENCOUNTER — Other Ambulatory Visit: Payer: Self-pay

## 2019-12-25 ENCOUNTER — Inpatient Hospital Stay: Payer: BC Managed Care – PPO

## 2019-12-25 VITALS — BP 134/87 | HR 81 | Temp 97.6°F | Resp 18 | Ht 64.0 in | Wt 166.8 lb

## 2019-12-25 DIAGNOSIS — C50411 Malignant neoplasm of upper-outer quadrant of right female breast: Secondary | ICD-10-CM

## 2019-12-25 DIAGNOSIS — K1230 Oral mucositis (ulcerative), unspecified: Secondary | ICD-10-CM | POA: Diagnosis not present

## 2019-12-25 DIAGNOSIS — C773 Secondary and unspecified malignant neoplasm of axilla and upper limb lymph nodes: Secondary | ICD-10-CM | POA: Diagnosis not present

## 2019-12-25 DIAGNOSIS — Z170 Estrogen receptor positive status [ER+]: Secondary | ICD-10-CM | POA: Diagnosis not present

## 2019-12-25 DIAGNOSIS — E860 Dehydration: Secondary | ICD-10-CM

## 2019-12-25 DIAGNOSIS — E876 Hypokalemia: Secondary | ICD-10-CM | POA: Diagnosis not present

## 2019-12-25 DIAGNOSIS — Z5189 Encounter for other specified aftercare: Secondary | ICD-10-CM | POA: Diagnosis not present

## 2019-12-25 DIAGNOSIS — K219 Gastro-esophageal reflux disease without esophagitis: Secondary | ICD-10-CM | POA: Diagnosis not present

## 2019-12-25 DIAGNOSIS — Z5111 Encounter for antineoplastic chemotherapy: Secondary | ICD-10-CM | POA: Diagnosis not present

## 2019-12-25 DIAGNOSIS — C50919 Malignant neoplasm of unspecified site of unspecified female breast: Secondary | ICD-10-CM | POA: Diagnosis not present

## 2019-12-25 DIAGNOSIS — R197 Diarrhea, unspecified: Secondary | ICD-10-CM | POA: Diagnosis not present

## 2019-12-25 DIAGNOSIS — Z95828 Presence of other vascular implants and grafts: Secondary | ICD-10-CM

## 2019-12-25 MED ORDER — HEPARIN SOD (PORK) LOCK FLUSH 100 UNIT/ML IV SOLN
500.0000 [IU] | Freq: Once | INTRAVENOUS | Status: AC
  Administered 2019-12-25: 500 [IU]
  Filled 2019-12-25: qty 5

## 2019-12-25 MED ORDER — SODIUM CHLORIDE 0.9% FLUSH
10.0000 mL | Freq: Once | INTRAVENOUS | Status: AC
  Administered 2019-12-25: 10 mL
  Filled 2019-12-25: qty 10

## 2019-12-25 MED ORDER — SODIUM CHLORIDE 0.9 % IV SOLN
Freq: Once | INTRAVENOUS | Status: AC
  Filled 2019-12-25: qty 250

## 2019-12-25 NOTE — Patient Instructions (Signed)

## 2019-12-28 ENCOUNTER — Other Ambulatory Visit: Payer: Self-pay | Admitting: General Surgery

## 2019-12-28 DIAGNOSIS — Z170 Estrogen receptor positive status [ER+]: Secondary | ICD-10-CM

## 2019-12-28 DIAGNOSIS — C50411 Malignant neoplasm of upper-outer quadrant of right female breast: Secondary | ICD-10-CM

## 2020-01-01 ENCOUNTER — Encounter: Payer: Self-pay | Admitting: *Deleted

## 2020-01-01 HISTORY — PX: BREAST LUMPECTOMY: SHX2

## 2020-01-02 ENCOUNTER — Other Ambulatory Visit: Payer: Self-pay | Admitting: General Surgery

## 2020-01-02 DIAGNOSIS — C50411 Malignant neoplasm of upper-outer quadrant of right female breast: Secondary | ICD-10-CM

## 2020-01-05 NOTE — Progress Notes (Signed)
Patient Care Team: [name], DO as PCP - General (Family Medicine) Mauro Kaufmann, RN as Oncology Nurse Navigator Rockwell Germany, RN as Oncology Nurse Navigator Gwyndolyn Kaufman, RN as Registered Nurse Gwyndolyn Kaufman, RN as Registered Nurse  DIAGNOSIS:    ICD-10-CM   1. Malignant neoplasm of upper-outer quadrant of right breast in female, estrogen receptor positive ([place])  C50.411    Z17.0     SUMMARY OF ONCOLOGIC HISTORY: Oncology History  Malignant neoplasm of upper-outer quadrant of right breast in female, estrogen receptor positive ([place])  07/28/2019 Initial Diagnosis   Screening mammogram detected right breast mass 11:30 position subareolar 1.3 cm, indeterminate 5 mm mass: Biopsy fibroadenoma, right axillary lymph node present, biopsy of the lymph node and the mass revealed grade 2 IDC ER 10%, PR 0%, Ki-67 45%, HER-2 +3+ by [facility]   08/20/2019 Cancer Staging   Staging form: Breast, AJCC 8th Edition - Clinical stage from 08/20/2019: Stage IIA (cT1c, cN1, cM0, G2, ER+, PR-, HER2+) - Signed by Nicholas Lose, MD on 08/20/2019   09/02/2019 -  Chemotherapy   The patient had dexamethasone (DECADRON) 4 MG tablet, 4 mg (100 % of original dose 4 mg), Oral, Daily, 1 of 1 cycle, Start date: 08/20/2019, End date: -- Dose modification: 4 mg (original dose 4 mg, Cycle 0) palonosetron (ALOXI) injection 0.25 mg, 0.25 mg, Intravenous,  Once, 6 of 6 cycles Administration: 0.25 mg (09/02/2019), 0.25 mg (09/23/2019), 0.25 mg (11/25/2019), 0.25 mg (12/16/2019), 0.25 mg (10/14/2019), 0.25 mg (11/04/2019) pegfilgrastim-jmdb (FULPHILA) injection 6 mg, 6 mg, Subcutaneous,  Once, 6 of 6 cycles Administration: 6 mg (09/04/2019), 6 mg (09/25/2019), 6 mg (11/27/2019), 6 mg (12/18/2019), 6 mg (10/16/2019), 6 mg (11/06/2019) CARBOplatin (PARAPLATIN) 700 mg in sodium chloride 0.9 % 250 mL chemo infusion, 700 mg (100 % of original dose 700 mg), Intravenous,  Once, 6 of 6 cycles Dose modification: 700 mg (original  dose 700 mg, Cycle 1), 700 mg (original dose 700 mg, Cycle 5), 600 mg (original dose 700 mg, Cycle 5, Reason: Other (see comments), Comment: dose reduce to 600 mg for CINV per [name]) Administration: 700 mg (09/02/2019), 700 mg (09/23/2019), 600 mg (11/25/2019), 600 mg (12/16/2019), 700 mg (10/14/2019), 600 mg (11/04/2019) DOCEtaxel (TAXOTERE) 150 mg in sodium chloride 0.9 % 250 mL chemo infusion, 75 mg/m2 = 150 mg, Intravenous,  Once, 6 of 6 cycles Dose modification: 50 mg/m2 (original dose 75 mg/m2, Cycle 5, Reason: Dose not tolerated), 50 mg/m2 (original dose 75 mg/m2, Cycle 4, Reason: Dose not tolerated) Administration: 150 mg (09/02/2019), 150 mg (09/23/2019), 100 mg (11/25/2019), 100 mg (12/16/2019), 150 mg (10/14/2019), 100 mg (11/04/2019) fosaprepitant (EMEND) 150 mg in sodium chloride 0.9 % 145 mL IVPB, 150 mg, Intravenous,  Once, 6 of 6 cycles Administration: 150 mg (09/02/2019), 150 mg (09/23/2019), 150 mg (11/25/2019), 150 mg (12/16/2019), 150 mg (10/14/2019), 150 mg (11/04/2019) pertuzumab (PERJETA) 420 mg in sodium chloride 0.9 % 250 mL chemo infusion, 420 mg (100 % of original dose 420 mg), Intravenous, Once, 5 of 5 cycles Dose modification: 420 mg (original dose 420 mg, Cycle 1, Reason: Provider Judgment) Administration: 420 mg (09/02/2019), 420 mg (09/23/2019), 420 mg (11/25/2019), 420 mg (10/14/2019), 420 mg (11/04/2019) trastuzumab-dkst (OGIVRI) 672 mg in sodium chloride 0.9 % 250 mL chemo infusion, 8 mg/kg = 672 mg, Intravenous,  Once, 6 of 6 cycles Administration: 672 mg (09/02/2019), 504 mg (09/23/2019), 504 mg (11/25/2019), 504 mg (12/16/2019), 504 mg (10/14/2019), 504 mg (11/04/2019)  for chemotherapy  treatment.      CHIEF COMPLIANT: Follow-up of right breast cancer   INTERVAL HISTORY: Stephanie Brewer is a 54 y.o. with above-mentioned history of right breast cancer who completed neoadjuvant chemotherapy. Breast MRI on 12/21/19 showed resolution of the previously identified right breast mass. She presents to the  clinic today to review her MRI and discuss further treatment.   ALLERGIES:  is allergic to hyoscyamine, caffeine, doxycycline, and nexium [esomeprazole].  MEDICATIONS:  Current Outpatient Medications  Medication Sig Dispense Refill  . ALLERGY CHILDRENS 12.5 MG/5ML liquid SWISH & SPIT 1WTEASPOONFUL 4 TIMES A DAYTAS NEEDED FOR MOUTH PAIN    . CORTEF 10 MG tablet Take by mouth.    . dexamethasone (DECADRON) 4 MG tablet Take 1 tablet (4 mg total) by mouth daily. Take 1 tablet day before chemo and 1 tablet day after chemo with food 12 tablet 0  . diphenoxylate-atropine (LOMOTIL) 2.5-0.025 MG tablet 1 to 2 four times daily as needed for diarrhea 60 tablet 3  . furosemide (LASIX) 20 MG tablet Take 1 tablet (20 mg total) by mouth 2 (two) times daily. 60 tablet 0  . lidocaine (XYLOCAINE) 2 % solution SWISH & SPIT 1WTEASPOONFUL 4 TIMES A DAYTAS NEEDED FOR MOUTH PAIN    . lidocaine (XYLOCAINE) 2 % solution Use as directed 5 mLs in the mouth or throat as needed for mouth pain. 200 mL 1  . lidocaine-prilocaine (EMLA) cream Apply to affected area once 30 g 3  . LORazepam (ATIVAN) 0.5 MG tablet Take 1 tablet (0.5 mg total) by mouth at bedtime as needed for sleep. 30 tablet 0  . magic mouthwash w/lidocaine SOLN Take 5 mLs by mouth 4 (four) times daily as needed for mouth pain. 120 mL 0  . Minocycline HCl 90 MG TB24 Take 1 tablet by mouth daily. Only takes prn for break outs    . nystatin (MYCOSTATIN) 100000 UNIT/ML suspension SMARTSIG:1 Teaspoon By Mouth 4 Times Daily PRN    . ondansetron (ZOFRAN) 8 MG tablet Take 1 tablet (8 mg total) by mouth 2 (two) times daily as needed (Nausea or vomiting). Begin 4 days after chemotherapy. 30 tablet 1  . potassium chloride SA (KLOR-CON) 20 MEQ tablet Take 1 tablet (20 mEq total) by mouth 2 (two) times daily. 60 tablet 0  . prochlorperazine (COMPAZINE) 10 MG tablet Take 1 tablet (10 mg total) by mouth every 6 (six) hours as needed (Nausea or vomiting). 30 tablet 1  .  rivaroxaban (XARELTO) 20 MG TABS tablet Take 1 tablet (20 mg total) by mouth daily with supper. x5 months. Then stop. 30 tablet 4  . saccharomyces boulardii (FLORASTOR) 250 MG capsule Take 250 mg by mouth daily.     No current facility-administered medications for this visit.   Facility-Administered Medications Ordered in Other Visits  Medication Dose Route Frequency Provider Last Rate Last Admin  . sodium chloride flush (NS) 0.9 % injection 10 mL  10 mL Intravenous PRN Nicholas Lose, MD   10 mL at 09/02/19 0839    PHYSICAL EXAMINATION: ECOG PERFORMANCE STATUS: 1 - Symptomatic but completely ambulatory  There were no vitals filed for this visit. There were no vitals filed for this visit.  LABORATORY DATA:  I have reviewed the data as listed CMP Latest Ref Rng & Units 12/16/2019 12/04/2019 11/25/2019  Glucose 70 - 99 mg/dL 116(H) 102(H) 119(H)  BUN 6 - 20 mg/dL 11 11 8   Creatinine 0.44 - 1.00 mg/dL 0.63 0.77 0.65  Sodium 135 - 145  mmol/L 139 135 139  Potassium 3.5 - 5.1 mmol/L 3.6 3.0(LL) 3.7  Chloride 98 - 111 mmol/L 107 98 108  CO2 22 - 32 mmol/L 25 28 23   Calcium 8.9 - 10.3 mg/dL 8.7(L) 9.2 9.5  Total Protein 6.5 - 8.1 g/dL 6.0(L) 7.0 6.2(L)  Total Bilirubin 0.3 - 1.2 mg/dL 0.3 0.4 0.4  Alkaline Phos 38 - 126 U/L 94 131(H) 93  AST 15 - 41 U/L 20 49(H) 36  ALT 0 - 44 U/L 17 56(H) 39    Lab Results  Component Value Date   WBC 2.9 (L) 01/06/2020   HGB 9.7 (L) 01/06/2020   HCT 30.0 (L) 01/06/2020   MCV 94.3 01/06/2020   PLT 194 01/06/2020   NEUTROABS 1.2 (L) 01/06/2020    ASSESSMENT & PLAN:  Malignant neoplasm of upper-outer quadrant of right breast in female, estrogen receptor positive ([place]) 07/28/2019:Screening mammogram detected right breast mass 11:30 position subareolar 1.3 cm, indeterminate 5 mm mass: Biopsy fibroadenoma, right axillary lymph node present, biopsy of the lymph node and the mass revealed grade 2 IDC ER 10%, PR 0%, Ki-67 45%, HER-2 +3+ by IHC T1 cN1 M0  stage IIa  Treatment plan: 1. Neoadjuvant chemotherapy with TCH Perjeta 6 cycles completed 12/16/19 (Perjeta D/Ced due to diarrhea) followed by Herceptin versus Kadcylamaintenance for 1 year 2. Followed by breast conserving surgery with sentinel lymph node study 3. Followed by adjuvant radiation therapy if patient had lumpectomy  Breast MRI: 3.8 cm tumor and 1 axillary lymphnode. ---------------------------------------------------------------------------------------------------------------------------------------------------- Breast MRI 12/21/19: Complete imaging response Surgery being planned for next Thursday, 01/14/2020  RTC after surgery to discuss further adjuvant treatment plan (Herceptin vs Kadcyla Vs Compass Her 2) Continue with Herceptin maintenance  I filled out the paperwork for her disability short-term application   No orders of the defined types were placed in this encounter.  The patient has a good understanding of the overall plan. she agrees with it. she will call with any problems that may develop before the next visit here.  Total time spent: 30 mins including face to face time and time spent for planning, charting and coordination of care  Nicholas Lose, MD 01/06/2020  I, Cloyde Reams Dorshimer, am acting as scribe for Dr. Nicholas Lose.  I have reviewed the above documentation for accuracy and completeness, and I agree with the above.

## 2020-01-06 ENCOUNTER — Other Ambulatory Visit: Payer: Self-pay | Admitting: Hematology and Oncology

## 2020-01-06 ENCOUNTER — Inpatient Hospital Stay: Payer: BC Managed Care – PPO

## 2020-01-06 ENCOUNTER — Other Ambulatory Visit: Payer: Self-pay

## 2020-01-06 ENCOUNTER — Inpatient Hospital Stay (HOSPITAL_BASED_OUTPATIENT_CLINIC_OR_DEPARTMENT_OTHER): Payer: BC Managed Care – PPO | Admitting: Hematology and Oncology

## 2020-01-06 ENCOUNTER — Encounter: Payer: Self-pay | Admitting: *Deleted

## 2020-01-06 ENCOUNTER — Inpatient Hospital Stay: Payer: BC Managed Care – PPO | Attending: Hematology and Oncology

## 2020-01-06 DIAGNOSIS — C50411 Malignant neoplasm of upper-outer quadrant of right female breast: Secondary | ICD-10-CM | POA: Diagnosis not present

## 2020-01-06 DIAGNOSIS — Z170 Estrogen receptor positive status [ER+]: Secondary | ICD-10-CM | POA: Diagnosis not present

## 2020-01-06 DIAGNOSIS — Z95828 Presence of other vascular implants and grafts: Secondary | ICD-10-CM

## 2020-01-06 DIAGNOSIS — C773 Secondary and unspecified malignant neoplasm of axilla and upper limb lymph nodes: Secondary | ICD-10-CM | POA: Insufficient documentation

## 2020-01-06 DIAGNOSIS — Z5112 Encounter for antineoplastic immunotherapy: Secondary | ICD-10-CM | POA: Diagnosis not present

## 2020-01-06 LAB — CMP (CANCER CENTER ONLY)
ALT: 18 U/L (ref 0–44)
AST: 22 U/L (ref 15–41)
Albumin: 3.1 g/dL — ABNORMAL LOW (ref 3.5–5.0)
Alkaline Phosphatase: 83 U/L (ref 38–126)
Anion gap: 4 — ABNORMAL LOW (ref 5–15)
BUN: 7 mg/dL (ref 6–20)
CO2: 32 mmol/L (ref 22–32)
Calcium: 8.4 mg/dL — ABNORMAL LOW (ref 8.9–10.3)
Chloride: 105 mmol/L (ref 98–111)
Creatinine: 0.59 mg/dL (ref 0.44–1.00)
GFR, Estimated: >60 mL/min (ref >60)
Glucose, Bld: 93 mg/dL (ref 70–99)
Potassium: 3.1 mmol/L — ABNORMAL LOW (ref 3.5–5.1)
Sodium: 141 mmol/L (ref 135–145)
Total Bilirubin: 0.3 mg/dL (ref 0.3–1.2)
Total Protein: 5.5 g/dL — ABNORMAL LOW (ref 6.5–8.1)

## 2020-01-06 LAB — CBC WITH DIFFERENTIAL (CANCER CENTER ONLY)
Abs Immature Granulocytes: 0.02 10*3/uL (ref 0.00–0.07)
Basophils Absolute: 0.1 10*3/uL (ref 0.0–0.1)
Basophils Relative: 2 %
Eosinophils Absolute: 0 10*3/uL (ref 0.0–0.5)
Eosinophils Relative: 1 %
HCT: 30 % — ABNORMAL LOW (ref 36.0–46.0)
Hemoglobin: 9.7 g/dL — ABNORMAL LOW (ref 12.0–15.0)
Immature Granulocytes: 1 %
Lymphocytes Relative: 39 %
Lymphs Abs: 1.2 10*3/uL (ref 0.7–4.0)
MCH: 30.5 pg (ref 26.0–34.0)
MCHC: 32.3 g/dL (ref 30.0–36.0)
MCV: 94.3 fL (ref 80.0–100.0)
Monocytes Absolute: 0.5 10*3/uL (ref 0.1–1.0)
Monocytes Relative: 17 %
Neutro Abs: 1.2 10*3/uL — ABNORMAL LOW (ref 1.7–7.7)
Neutrophils Relative %: 40 %
Platelet Count: 194 10*3/uL (ref 150–400)
RBC: 3.18 MIL/uL — ABNORMAL LOW (ref 3.87–5.11)
RDW: 15.7 % — ABNORMAL HIGH (ref 11.5–15.5)
WBC Count: 2.9 10*3/uL — ABNORMAL LOW (ref 4.0–10.5)
nRBC: 0 % (ref 0.0–0.2)

## 2020-01-06 MED ORDER — DIPHENHYDRAMINE HCL 25 MG PO CAPS
ORAL_CAPSULE | ORAL | Status: AC
  Filled 2020-01-06: qty 1

## 2020-01-06 MED ORDER — DIPHENHYDRAMINE HCL 25 MG PO CAPS
25.0000 mg | ORAL_CAPSULE | Freq: Once | ORAL | Status: AC
  Administered 2020-01-06: 25 mg via ORAL

## 2020-01-06 MED ORDER — SODIUM CHLORIDE 0.9 % IV SOLN
Freq: Once | INTRAVENOUS | Status: AC
  Filled 2020-01-06: qty 250

## 2020-01-06 MED ORDER — HEPARIN SOD (PORK) LOCK FLUSH 100 UNIT/ML IV SOLN
500.0000 [IU] | Freq: Once | INTRAVENOUS | Status: AC | PRN
  Administered 2020-01-06: 500 [IU]
  Filled 2020-01-06: qty 5

## 2020-01-06 MED ORDER — SODIUM CHLORIDE 0.9% FLUSH
10.0000 mL | INTRAVENOUS | Status: DC | PRN
  Administered 2020-01-06: 10 mL
  Filled 2020-01-06: qty 10

## 2020-01-06 MED ORDER — ACETAMINOPHEN 325 MG PO TABS
650.0000 mg | ORAL_TABLET | Freq: Once | ORAL | Status: AC
  Administered 2020-01-06: 650 mg via ORAL

## 2020-01-06 MED ORDER — TRASTUZUMAB-DKST CHEMO 150 MG IV SOLR
504.0000 mg | Freq: Once | INTRAVENOUS | Status: AC
  Administered 2020-01-06: 504 mg via INTRAVENOUS
  Filled 2020-01-06: qty 24

## 2020-01-06 MED ORDER — ACETAMINOPHEN 325 MG PO TABS
ORAL_TABLET | ORAL | Status: AC
  Filled 2020-01-06: qty 2

## 2020-01-06 MED ORDER — SODIUM CHLORIDE 0.9% FLUSH
10.0000 mL | Freq: Once | INTRAVENOUS | Status: AC
  Administered 2020-01-06: 10 mL
  Filled 2020-01-06: qty 10

## 2020-01-06 NOTE — Patient Instructions (Signed)
[facility] Discharge Instructions for Patients Receiving Chemotherapy  Today you received the following chemotherapy agents trastuzimab  To help prevent nausea and vomiting after your treatment, we encourage you to take your nausea medication as directed.    If you develop nausea and vomiting that is not controlled by your nausea medication, call the clinic.   BELOW ARE SYMPTOMS THAT SHOULD BE REPORTED IMMEDIATELY:  *FEVER GREATER THAN 100.5 F  *CHILLS WITH OR WITHOUT FEVER  NAUSEA AND VOMITING THAT IS NOT CONTROLLED WITH YOUR NAUSEA MEDICATION  *UNUSUAL SHORTNESS OF BREATH  *UNUSUAL BRUISING OR BLEEDING  TENDERNESS IN MOUTH AND THROAT WITH OR WITHOUT PRESENCE OF ULCERS  *URINARY PROBLEMS  *BOWEL PROBLEMS  UNUSUAL RASH Items with * indicate a potential emergency and should be followed up as soon as possible.  Feel free to call the clinic should you have any questions or concerns. The clinic phone number is [phone].  Please show the [facility] at check-in to the Emergency Department and triage nurse.

## 2020-01-06 NOTE — Progress Notes (Signed)
Nutrition Follow-up:   Patient with right breast cancer.  Patient has completed neoadjuvant treatment.  Planning surgery on 10/14.  Starting herceptin.    Met with patient today in infusion.  Patient reports that her appetite and nausea are better.  Reports that she is wanting to eat more red meat, got sick on chicken.  Was not able to tolerate "juice type shakes".      Medications: reviewed  Labs: K 3.1   Anthropometrics:   Weight 178 lb 1.6 oz today, stable from weight of 179 lb 14.4 oz on 9/15  180 lb 8/25   NUTRITION DIAGNOSIS: Inadequate oral intake continues   INTERVENTION:  Discussed importance of maximizing nutrition prior to surgery.  Encouraged protein foods.     MONITORING, EVALUATION, GOAL: weight trends, intake   NEXT VISIT: November 17 during infusion  Stephanie Brewer B. Zenia Resides, [place], [place] Registered Dietitian [phone] (mobile)

## 2020-01-06 NOTE — Assessment & Plan Note (Signed)
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 Neoadjuvant chemotherapy with TCH Perjeta 6 cycles completed 12/16/19 (Perjeta D/Ced due to diarrhea) followed by Herceptin versus Kadcylamaintenance for 1 year 2. Followed by breast conserving surgery with sentinel lymph node study 3. Followed by adjuvant radiation therapy if patient had lumpectomy  Breast MRI: 3.8 cm tumor and 1 axillary lymphnode. ---------------------------------------------------------------------------------------------------------------------------------------------------- Breast MRI 12/21/19: Complete imaging response Surgery being planned for  RTC after surgery to discuss further adjuvant treatment plan (Herceptin vs Kadcyla Vs Compass Her 2) Continue with Herceptin maintenance

## 2020-01-06 NOTE — Patient Instructions (Signed)

## 2020-01-07 ENCOUNTER — Other Ambulatory Visit: Payer: Self-pay

## 2020-01-07 ENCOUNTER — Encounter (HOSPITAL_BASED_OUTPATIENT_CLINIC_OR_DEPARTMENT_OTHER): Payer: Self-pay | Admitting: Plastic Surgery

## 2020-01-08 ENCOUNTER — Telehealth: Payer: Self-pay | Admitting: Hematology and Oncology

## 2020-01-08 NOTE — Telephone Encounter (Signed)
No 10/6 los, no changes made to pt schedule   

## 2020-01-11 ENCOUNTER — Other Ambulatory Visit (HOSPITAL_COMMUNITY)
Admission: RE | Admit: 2020-01-11 | Discharge: 2020-01-11 | Disposition: A | Discharge status: Home / Self Care | Payer: BC Managed Care – PPO | Source: Ambulatory Visit | Attending: Plastic Surgery | Admitting: Plastic Surgery

## 2020-01-11 ENCOUNTER — Other Ambulatory Visit: Payer: Self-pay

## 2020-01-11 ENCOUNTER — Other Ambulatory Visit (HOSPITAL_COMMUNITY): Payer: BC Managed Care – PPO

## 2020-01-11 ENCOUNTER — Encounter (HOSPITAL_BASED_OUTPATIENT_CLINIC_OR_DEPARTMENT_OTHER)
Admission: RE | Admit: 2020-01-11 | Discharge: 2020-01-11 | Disposition: A | Discharge status: Home / Self Care | Payer: BC Managed Care – PPO | Source: Ambulatory Visit | Attending: Plastic Surgery | Admitting: Plastic Surgery

## 2020-01-11 ENCOUNTER — Ambulatory Visit (INDEPENDENT_AMBULATORY_CARE_PROVIDER_SITE_OTHER): Payer: BC Managed Care – PPO | Admitting: Surgical

## 2020-01-11 ENCOUNTER — Encounter: Payer: Self-pay | Admitting: Surgical

## 2020-01-11 VITALS — BP 120/79 | HR 99 | Temp 98.5°F | Ht 64.0 in | Wt 177.4 lb

## 2020-01-11 DIAGNOSIS — Z9221 Personal history of antineoplastic chemotherapy: Secondary | ICD-10-CM | POA: Diagnosis not present

## 2020-01-11 DIAGNOSIS — C50411 Malignant neoplasm of upper-outer quadrant of right female breast: Secondary | ICD-10-CM | POA: Diagnosis not present

## 2020-01-11 DIAGNOSIS — Z01812 Encounter for preprocedural laboratory examination: Secondary | ICD-10-CM | POA: Insufficient documentation

## 2020-01-11 DIAGNOSIS — Z20822 Contact with and (suspected) exposure to covid-19: Secondary | ICD-10-CM | POA: Diagnosis not present

## 2020-01-11 DIAGNOSIS — Z9889 Other specified postprocedural states: Secondary | ICD-10-CM | POA: Diagnosis not present

## 2020-01-11 DIAGNOSIS — Z7901 Long term (current) use of anticoagulants: Secondary | ICD-10-CM | POA: Diagnosis not present

## 2020-01-11 DIAGNOSIS — Z86718 Personal history of other venous thrombosis and embolism: Secondary | ICD-10-CM | POA: Diagnosis not present

## 2020-01-11 DIAGNOSIS — Z170 Estrogen receptor positive status [ER+]: Secondary | ICD-10-CM

## 2020-01-11 DIAGNOSIS — C773 Secondary and unspecified malignant neoplasm of axilla and upper limb lymph nodes: Secondary | ICD-10-CM | POA: Diagnosis not present

## 2020-01-11 LAB — BASIC METABOLIC PANEL
Anion gap: 9 (ref 5–15)
BUN: 5 mg/dL — ABNORMAL LOW (ref 6–20)
CO2: 26 mmol/L (ref 22–32)
Calcium: 8.9 mg/dL (ref 8.9–10.3)
Chloride: 106 mmol/L (ref 98–111)
Creatinine, Ser: 0.61 mg/dL (ref 0.44–1.00)
GFR, Estimated: >60 mL/min (ref >60)
Glucose, Bld: 100 mg/dL — ABNORMAL HIGH (ref 70–99)
Potassium: 3.8 mmol/L (ref 3.5–5.1)
Sodium: 141 mmol/L (ref 135–145)

## 2020-01-11 LAB — SARS CORONAVIRUS 2 (TAT 6-24 HRS): SARS Coronavirus 2: NEGATIVE

## 2020-01-11 MED ORDER — ENSURE PRE-SURGERY PO LIQD: 296.0000 mL | Freq: Once | ORAL | Status: DC

## 2020-01-11 MED ORDER — HYDROCODONE-ACETAMINOPHEN 5-325 MG PO TABS
1.0000 | ORAL_TABLET | Freq: Four times a day (QID) | ORAL | 0 refills | Status: AC | PRN
Start: 2020-01-11 — End: 2020-01-16

## 2020-01-11 NOTE — Progress Notes (Signed)
Patient ID: Stephanie Brewer, female    DOB: 04/13/1965, 54 y.o.   MRN: [id number]  Chief Complaint  Patient presents with  . Pre-op Exam      ICD-10-CM   1. Malignant neoplasm of upper-outer quadrant of right breast in female, estrogen receptor positive ([place])  C50.411    Z17.0      History of Present Illness: Stephanie Brewer is a 54 y.o.  female  with a diagnosis of right-sided breast cancer and positive lymph node, currently undergoing neoadjuvant chemotherapy.  She presents for preoperative evaluation for upcoming procedure, removal of bilateral breast implants by Dr. Claudia Desanctis followed by right breast lumpectomy with radioactive seed and sentinel lymph node biopsy and right axillary lymph node radioactive seed guided excision by Dr. Donne Hazel, scheduled for 01/14/20.  The patient has not had problems with anesthesia. No family history of DVT/PE.  No family or personal history of bleeding or clotting disorders. No history of CVA/MI.  Patient has a history of DVT in the left leg after foot/ankle surgery, she is currently on Xarelto for 30 more days.  She reports that she discussed this with Dr. Donne Hazel and he recommended stopping this 3 days prior to surgery.    Summary of Previous Visit: Patient had subpectoral implants placed for cosmetic reasons many years ago, she would like to be quite a bit smaller if possible, she would like to be a B or C cup.  PMH Significant for: GERD, hx of DVT in February 2021 (left leg after ankle/foot surgery).  She has had some swelling in her bilateral lower extremities which she is taking Lasix for. Patient had preadmission testing today.  She is scheduled to begin radiation approximately 4 weeks after surgery.   Past Medical History: Allergies: Allergies  Allergen Reactions  . Hyoscyamine Anaphylaxis  . Caffeine     Rapid heart rate.  . Doxycycline     Racing heart.  Marland Kitchen Nexium [Esomeprazole]     Swollen throat    Current Medications:  Current  Outpatient Medications:  .  furosemide (LASIX) 20 MG tablet, Take 1 tablet (20 mg total) by mouth 2 (two) times daily., Disp: 60 tablet, Rfl: 0 .  potassium chloride SA (KLOR-CON) 20 MEQ tablet, Take 1 tablet (20 mEq total) by mouth 2 (two) times daily., Disp: 60 tablet, Rfl: 0 .  ALLERGY CHILDRENS 12.5 MG/5ML liquid, SWISH & SPIT 1WTEASPOONFUL 4 TIMES A DAYTAS NEEDED FOR MOUTH PAIN (Patient not taking: Reported on 01/11/2020), Disp: , Rfl:  .  diphenoxylate-atropine (LOMOTIL) 2.5-0.025 MG tablet, 1 to 2 four times daily as needed for diarrhea (Patient not taking: Reported on 01/11/2020), Disp: 60 tablet, Rfl: 3 .  HYDROcodone-acetaminophen (NORCO) 5-325 MG tablet, Take 1 tablet by mouth every 6 (six) hours as needed for up to 5 days for severe pain., Disp: 20 tablet, Rfl: 0 .  lidocaine (XYLOCAINE) 2 % solution, SWISH & SPIT 1WTEASPOONFUL 4 TIMES A DAYTAS NEEDED FOR MOUTH PAIN (Patient not taking: Reported on 01/11/2020), Disp: , Rfl:  .  lidocaine (XYLOCAINE) 2 % solution, Use as directed 5 mLs in the mouth or throat as needed for mouth pain. (Patient not taking: Reported on 01/11/2020), Disp: 200 mL, Rfl: 1 .  magic mouthwash w/lidocaine SOLN, Take 5 mLs by mouth 4 (four) times daily as needed for mouth pain. (Patient not taking: Reported on 01/11/2020), Disp: 120 mL, Rfl: 0 .  Minocycline HCl 90 MG TB24, Take 1 tablet by mouth daily. Only takes  prn for break outs (Patient not taking: Reported on 01/11/2020), Disp: , Rfl:  .  nystatin (MYCOSTATIN) 100000 UNIT/ML suspension, SMARTSIG:1 Teaspoon By Mouth 4 Times Daily PRN (Patient not taking: Reported on 01/11/2020), Disp: , Rfl:  .  rivaroxaban (XARELTO) 20 MG TABS tablet, Take 1 tablet (20 mg total) by mouth daily with supper. x5 months. Then stop. (Patient not taking: Reported on 01/11/2020), Disp: 30 tablet, Rfl: 4 No current facility-administered medications for this visit.  Facility-Administered Medications Ordered in Other Visits:  .  [START  ON 01/12/2020] feeding supplement (ENSURE PRE-SURGERY) liquid 296 mL, 296 mL, Oral, Once, Rolm Bookbinder, MD .  sodium chloride flush (NS) 0.9 % injection 10 mL, 10 mL, Intravenous, PRN, Nicholas Lose, MD, 10 mL at 09/02/19 5093  Past Medical Problems: Past Medical History:  Diagnosis Date  . Cancer ([place])   . DVT (deep venous thrombosis) ([facility])    left leg knee and ankle     Past Surgical History: Past Surgical History:  Procedure Laterality Date  . BREAST SURGERY     Augmentation 2003  . HERNIA REPAIR  2002  . PORTACATH PLACEMENT N/A 09/01/2019   Procedure: INSERTION PORT-A-CATH WITH ULTRASOUND GUIDANCE;  Surgeon: Rolm Bookbinder, MD;  Location: [facility];  Service: General;  Laterality: N/A;    Social History: Social History   Socioeconomic History  . Marital status: Married    Spouse name: Not on file  . Number of children: 2  . Years of education: some college  . Highest education level: Not on file  Occupational History  . 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 Smoking status: Never Smoker  . Smokeless tobacco: Never Used  Vaping Use  . Vaping Use: Never used  Substance and Sexual Activity  . Alcohol use: Yes    Comment: rare-twice monthly or less  . Drug use: No  . Sexual activity: Yes    Birth control/protection: I.U.D.  Other Topics Concern  . Not on file  Social History Narrative   Lives with spouse   Caffeine use: no caffeine    Left handed    Social Determinants of Health   Financial Resource Strain:   . Difficulty of Paying Living Expenses: Not on file  Food Insecurity:   . Worried About Charity fundraiser in the Last Year: Not on file  . Ran Out of Food in the Last Year: Not on file  Transportation Needs:   . Lack of Transportation (Medical): Not on file  . Lack of Transportation (Non-Medical): Not on file  Physical Activity:   . Days of Exercise per Week: Not on file  . Minutes of Exercise per Session: Not on file    Stress:   . Feeling of Stress : Not on file  Social Connections:   . Frequency of Communication with Friends and Family: Not on file  . Frequency of Social Gatherings with Friends and Family: Not on file  . Attends Religious Services: Not on file  . Active Member of Clubs or Organizations: Not on file  . Attends Archivist Meetings: Not on file  . Marital Status: Not on file  Intimate Partner Violence:   . Fear of Current or Ex-Partner: Not on file  . Emotionally Abused: Not on file  . Physically Abused: Not on file  . Sexually Abused: Not on file    Family History: Family History  Problem Relation Age of Onset  . Hypertension Father  Review of Systems: Review of Systems  Constitutional: Negative.   Eyes: Positive for discharge (Watery eyes from chemotherapy).  Respiratory: Negative.   Cardiovascular: Negative.   Gastrointestinal: Negative.   Skin: Negative.     Physical Exam: Vital Signs BP 120/79 (BP Location: Right Arm, Patient Position: Sitting, Cuff Size: Large)   Pulse 99   Temp 98.5 F (36.9 C) (Oral)   Ht 5\' 4"  (1.626 m)   Wt 177 lb 6.4 oz (80.5 kg)   SpO2 100%   BMI 30.45 kg/m   Physical Exam Exam conducted with a chaperone present.  Constitutional:      General: She is not in acute distress.    Appearance: Normal appearance. She is not ill-appearing.  HENT:     Head: Normocephalic and atraumatic.  Eyes:     Pupils: Pupils are equal, round Neck:     Musculoskeletal: Normal range of motion.  Cardiovascular:     Rate and Rhythm: Normal rate and regular rhythm.     Pulses: Normal pulses.     Heart sounds: Normal heart sounds. No murmur.  Pulmonary:     Effort: Pulmonary effort is normal. No respiratory distress.     Breath sounds: Normal breath sounds. No wheezing.  Abdominal:     General: Abdomen is flat. There is no distension.     Palpations: Abdomen is soft.     Tenderness: There is no abdominal tenderness.  Musculoskeletal:  Normal range of motion.  Skin:    General: Skin is warm and dry.     Findings: No erythema or rash.  Neurological:     General: No focal deficit present.     Mental Status: She is alert and oriented to person, place, and time. Mental status is at baseline.     Motor: No weakness.  Psychiatric:        Mood and Affect: Mood normal.        Behavior: Behavior normal.    Assessment/Plan: The patient is scheduled for removal of bilateral breast implants by Dr. Claudia Desanctis and right breast lumpectomy with radioactive seed and sentinel lymph node biopsy, right breast axillary lymph node radioactive seed guided excision by Dr. Donne Hazel.  I personally discussed with the patient today the risks of removal of bilateral breast implants.  Patient had a preop appointment with general surgery where they discussed the risks associated with right breast lumpectomy and lymph node biopsies/seed excision.  Smoking Status: Non-smoker; Counseling Given?  N/A Patient had bilateral breast MRI on 12/21/2019, left breast showed no mass or abnormal enhancement, previously identified right-sided malignancy is resolved on today's imaging.  No MRI evidence of malignancy in either breast.  Caprini Score: Highest risk, 13 points; Risk Factors include: Age, current malignancy, chemotherapy, history of DVT, currently swollen legs, port in place BMI greater than 25, and length of planned surgery. Recommendation for mechanical and pharmacological prophylaxis. Encourage early ambulation.  Patient is scheduled to restart her Xarelto postoperatively.   Post-op Rx sent to pharmacy: Norco.  Patient reports she has Zofran at home.  Patient was provided with the General Surgical Risk consent document and Pain Medication Agreement prior to their appointment.  They had adequate time to read through the risk consent documents and Pain Medication Agreement. We also discussed them in person together during this preop appointment. All of their  questions were answered to their satisfaction.  Recommended calling if they have any further questions.  Risk consent form and Pain Medication Agreement to be scanned into  patient's chart.  During today's visit we discussed the patient's personal risk factors for poor wound healing or complications, including history of DVT, history of chemotherapy, plan for radiation to right breast.  Patient is currently on Xarelto which she is planning to stop 3 days prior to surgery.  She is planning to discuss when she should restart this with Dr. Donne Hazel, I discussed with her our typical protocol is 24 hours, however I recommend she further discuss this with Dr. Donne Hazel.   Electronically signed by: Carola Rhine Jarmarcus Wambold, PA-C 01/11/2020 2:11 PM

## 2020-01-11 NOTE — H&P (View-Only) (Signed)
Patient ID: Stephanie Brewer, female    DOB: 1965/12/01, 54 y.o.   MRN: [id number]  Chief Complaint  Patient presents with  . Pre-op Exam      ICD-10-CM   1. Malignant neoplasm of upper-outer quadrant of right breast in female, estrogen receptor positive ([place])  C50.411    Z17.0      History of Present Illness: Stephanie Brewer is a 54 y.o.  female  with a diagnosis of right-sided breast cancer and positive lymph node, currently undergoing neoadjuvant chemotherapy.  She presents for preoperative evaluation for upcoming procedure, removal of bilateral breast implants by Dr. Claudia Desanctis followed by right breast lumpectomy with radioactive seed and sentinel lymph node biopsy and right axillary lymph node radioactive seed guided excision by Dr. Donne Hazel, scheduled for 01/14/20.  The patient has not had problems with anesthesia. No family history of DVT/PE.  No family or personal history of bleeding or clotting disorders. No history of CVA/MI.  Patient has a history of DVT in the left leg after foot/ankle surgery, she is currently on Xarelto for 30 more days.  She reports that she discussed this with Dr. Donne Hazel and he recommended stopping this 3 days prior to surgery.    Summary of Previous Visit: Patient had subpectoral implants placed for cosmetic reasons many years ago, she would like to be quite a bit smaller if possible, she would like to be a B or C cup.  PMH Significant for: GERD, hx of DVT in February 2021 (left leg after ankle/foot surgery).  She has had some swelling in her bilateral lower extremities which she is taking Lasix for. Patient had preadmission testing today.  She is scheduled to begin radiation approximately 4 weeks after surgery.   Past Medical History: Allergies: Allergies  Allergen Reactions  . Hyoscyamine Anaphylaxis  . Caffeine     Rapid heart rate.  . Doxycycline     Racing heart.  Marland Kitchen Nexium [Esomeprazole]     Swollen throat    Current Medications:  Current  Outpatient Medications:  .  furosemide (LASIX) 20 MG tablet, Take 1 tablet (20 mg total) by mouth 2 (two) times daily., Disp: 60 tablet, Rfl: 0 .  potassium chloride SA (KLOR-CON) 20 MEQ tablet, Take 1 tablet (20 mEq total) by mouth 2 (two) times daily., Disp: 60 tablet, Rfl: 0 .  ALLERGY CHILDRENS 12.5 MG/5ML liquid, SWISH & SPIT 1WTEASPOONFUL 4 TIMES A DAYTAS NEEDED FOR MOUTH PAIN (Patient not taking: Reported on 01/11/2020), Disp: , Rfl:  .  diphenoxylate-atropine (LOMOTIL) 2.5-0.025 MG tablet, 1 to 2 four times daily as needed for diarrhea (Patient not taking: Reported on 01/11/2020), Disp: 60 tablet, Rfl: 3 .  HYDROcodone-acetaminophen (NORCO) 5-325 MG tablet, Take 1 tablet by mouth every 6 (six) hours as needed for up to 5 days for severe pain., Disp: 20 tablet, Rfl: 0 .  lidocaine (XYLOCAINE) 2 % solution, SWISH & SPIT 1WTEASPOONFUL 4 TIMES A DAYTAS NEEDED FOR MOUTH PAIN (Patient not taking: Reported on 01/11/2020), Disp: , Rfl:  .  lidocaine (XYLOCAINE) 2 % solution, Use as directed 5 mLs in the mouth or throat as needed for mouth pain. (Patient not taking: Reported on 01/11/2020), Disp: 200 mL, Rfl: 1 .  magic mouthwash w/lidocaine SOLN, Take 5 mLs by mouth 4 (four) times daily as needed for mouth pain. (Patient not taking: Reported on 01/11/2020), Disp: 120 mL, Rfl: 0 .  Minocycline HCl 90 MG TB24, Take 1 tablet by mouth daily. Only takes  prn for break outs (Patient not taking: Reported on 01/11/2020), Disp: , Rfl:  .  nystatin (MYCOSTATIN) 100000 UNIT/ML suspension, SMARTSIG:1 Teaspoon By Mouth 4 Times Daily PRN (Patient not taking: Reported on 01/11/2020), Disp: , Rfl:  .  rivaroxaban (XARELTO) 20 MG TABS tablet, Take 1 tablet (20 mg total) by mouth daily with supper. x5 months. Then stop. (Patient not taking: Reported on 01/11/2020), Disp: 30 tablet, Rfl: 4 No current facility-administered medications for this visit.  Facility-Administered Medications Ordered in Other Visits:  .  [START  ON 01/12/2020] feeding supplement (ENSURE PRE-SURGERY) liquid 296 mL, 296 mL, Oral, Once, Rolm Bookbinder, MD .  sodium chloride flush (NS) 0.9 % injection 10 mL, 10 mL, Intravenous, PRN, Nicholas Lose, MD, 10 mL at 09/02/19 2563  Past Medical Problems: Past Medical History:  Diagnosis Date  . Cancer ([place])   . DVT (deep venous thrombosis) ([facility])    left leg knee and ankle     Past Surgical History: Past Surgical History:  Procedure Laterality Date  . BREAST SURGERY     Augmentation 2003  . HERNIA REPAIR  2002  . PORTACATH PLACEMENT N/A 09/01/2019   Procedure: INSERTION PORT-A-CATH WITH ULTRASOUND GUIDANCE;  Surgeon: Rolm Bookbinder, MD;  Location: [facility];  Service: General;  Laterality: N/A;    Social History: Social History   Socioeconomic History  . Marital status: Married    Spouse name: Not on file  . Number of children: 2  . Years of education: some college  . Highest education level: Not on file  Occupational History  . 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 Smoking status: Never Smoker  . Smokeless tobacco: Never Used  Vaping Use  . Vaping Use: Never used  Substance and Sexual Activity  . Alcohol use: Yes    Comment: rare-twice monthly or less  . Drug use: No  . Sexual activity: Yes    Birth control/protection: I.U.D.  Other Topics Concern  . Not on file  Social History Narrative   Lives with spouse   Caffeine use: no caffeine    Left handed    Social Determinants of Health   Financial Resource Strain:   . Difficulty of Paying Living Expenses: Not on file  Food Insecurity:   . Worried About Charity fundraiser in the Last Year: Not on file  . Ran Out of Food in the Last Year: Not on file  Transportation Needs:   . Lack of Transportation (Medical): Not on file  . Lack of Transportation (Non-Medical): Not on file  Physical Activity:   . Days of Exercise per Week: Not on file  . Minutes of Exercise per Session: Not on file   Stress:   . Feeling of Stress : Not on file  Social Connections:   . Frequency of Communication with Friends and Family: Not on file  . Frequency of Social Gatherings with Friends and Family: Not on file  . Attends Religious Services: Not on file  . Active Member of Clubs or Organizations: Not on file  . Attends Archivist Meetings: Not on file  . Marital Status: Not on file  Intimate Partner Violence:   . Fear of Current or Ex-Partner: Not on file  . Emotionally Abused: Not on file  . Physically Abused: Not on file  . Sexually Abused: Not on file    Family History: Family History  Problem Relation Age of Onset  . Hypertension Father     Review  of Systems: Review of Systems  Constitutional: Negative.   Eyes: Positive for discharge (Watery eyes from chemotherapy).  Respiratory: Negative.   Cardiovascular: Negative.   Gastrointestinal: Negative.   Skin: Negative.     Physical Exam: Vital Signs BP 120/79 (BP Location: Right Arm, Patient Position: Sitting, Cuff Size: Large)   Pulse 99   Temp 98.5 F (36.9 C) (Oral)   Ht 5\' 4"  (1.626 m)   Wt 177 lb 6.4 oz (80.5 kg)   SpO2 100%   BMI 30.45 kg/m   Physical Exam Exam conducted with a chaperone present.  Constitutional:      General: She is not in acute distress.    Appearance: Normal appearance. She is not ill-appearing.  HENT:     Head: Normocephalic and atraumatic.  Eyes:     Pupils: Pupils are equal, round Neck:     Musculoskeletal: Normal range of motion.  Cardiovascular:     Rate and Rhythm: Normal rate and regular rhythm.     Pulses: Normal pulses.     Heart sounds: Normal heart sounds. No murmur.  Pulmonary:     Effort: Pulmonary effort is normal. No respiratory distress.     Breath sounds: Normal breath sounds. No wheezing.  Abdominal:     General: Abdomen is flat. There is no distension.     Palpations: Abdomen is soft.     Tenderness: There is no abdominal tenderness.  Musculoskeletal:  Normal range of motion.  Skin:    General: Skin is warm and dry.     Findings: No erythema or rash.  Neurological:     General: No focal deficit present.     Mental Status: She is alert and oriented to person, place, and time. Mental status is at baseline.     Motor: No weakness.  Psychiatric:        Mood and Affect: Mood normal.        Behavior: Behavior normal.    Assessment/Plan: The patient is scheduled for removal of bilateral breast implants by Dr. Claudia Desanctis and right breast lumpectomy with radioactive seed and sentinel lymph node biopsy, right breast axillary lymph node radioactive seed guided excision by Dr. Donne Hazel.  I personally discussed with the patient today the risks of removal of bilateral breast implants.  Patient had a preop appointment with general surgery where they discussed the risks associated with right breast lumpectomy and lymph node biopsies/seed excision.  Smoking Status: Non-smoker; Counseling Given?  N/A Patient had bilateral breast MRI on 12/21/2019, left breast showed no mass or abnormal enhancement, previously identified right-sided malignancy is resolved on today's imaging.  No MRI evidence of malignancy in either breast.  Caprini Score: Highest risk, 13 points; Risk Factors include: Age, current malignancy, chemotherapy, history of DVT, currently swollen legs, port in place BMI greater than 25, and length of planned surgery. Recommendation for mechanical and pharmacological prophylaxis. Encourage early ambulation.  Patient is scheduled to restart her Xarelto postoperatively.   Post-op Rx sent to pharmacy: Norco.  Patient reports she has Zofran at home.  Patient was provided with the General Surgical Risk consent document and Pain Medication Agreement prior to their appointment.  They had adequate time to read through the risk consent documents and Pain Medication Agreement. We also discussed them in person together during this preop appointment. All of their  questions were answered to their satisfaction.  Recommended calling if they have any further questions.  Risk consent form and Pain Medication Agreement to be scanned into patient's  chart.  During today's visit we discussed the patient's personal risk factors for poor wound healing or complications, including history of DVT, history of chemotherapy, plan for radiation to right breast.  Patient is currently on Xarelto which she is planning to stop 3 days prior to surgery.  She is planning to discuss when she should restart this with Dr. Donne Hazel, I discussed with her our typical protocol is 24 hours, however I recommend she further discuss this with Dr. Donne Hazel.   Electronically signed by: Carola Rhine Hung Rhinesmith, PA-C 01/11/2020 2:11 PM

## 2020-01-11 NOTE — Progress Notes (Signed)

## 2020-01-13 ENCOUNTER — Other Ambulatory Visit: Payer: Self-pay

## 2020-01-13 ENCOUNTER — Ambulatory Visit
Admission: RE | Admit: 2020-01-13 | Discharge: 2020-01-13 | Disposition: A | Discharge status: Home / Self Care | Payer: BC Managed Care – PPO | Source: Ambulatory Visit | Attending: General Surgery | Admitting: General Surgery

## 2020-01-13 ENCOUNTER — Other Ambulatory Visit: Payer: Self-pay | Admitting: General Surgery

## 2020-01-13 DIAGNOSIS — C50411 Malignant neoplasm of upper-outer quadrant of right female breast: Secondary | ICD-10-CM

## 2020-01-13 DIAGNOSIS — Z170 Estrogen receptor positive status [ER+]: Secondary | ICD-10-CM

## 2020-01-13 DIAGNOSIS — C50911 Malignant neoplasm of unspecified site of right female breast: Secondary | ICD-10-CM | POA: Diagnosis not present

## 2020-01-14 ENCOUNTER — Ambulatory Visit
Admit: 2020-01-14 | Discharge: 2020-01-14 | Disposition: A | Discharge status: Home / Self Care | Payer: BC Managed Care – PPO | Attending: General Surgery | Admitting: General Surgery

## 2020-01-14 ENCOUNTER — Encounter (HOSPITAL_BASED_OUTPATIENT_CLINIC_OR_DEPARTMENT_OTHER)
Admission: RE | Disposition: A | Discharge status: Home / Self Care | Payer: Self-pay | Source: Home / Self Care | Attending: Plastic Surgery

## 2020-01-14 ENCOUNTER — Encounter (HOSPITAL_BASED_OUTPATIENT_CLINIC_OR_DEPARTMENT_OTHER): Payer: Self-pay | Admitting: Plastic Surgery

## 2020-01-14 ENCOUNTER — Other Ambulatory Visit: Payer: Self-pay

## 2020-01-14 ENCOUNTER — Ambulatory Visit (HOSPITAL_BASED_OUTPATIENT_CLINIC_OR_DEPARTMENT_OTHER)
Admission: RE | Admit: 2020-01-14 | Discharge: 2020-01-14 | Disposition: A | Discharge status: Home / Self Care | Payer: BC Managed Care – PPO | Attending: Plastic Surgery | Admitting: Plastic Surgery

## 2020-01-14 ENCOUNTER — Ambulatory Visit (HOSPITAL_BASED_OUTPATIENT_CLINIC_OR_DEPARTMENT_OTHER): Payer: BC Managed Care – PPO | Admitting: Certified Registered"

## 2020-01-14 ENCOUNTER — Ambulatory Visit (HOSPITAL_COMMUNITY): Payer: BC Managed Care – PPO

## 2020-01-14 ENCOUNTER — Ambulatory Visit (HOSPITAL_COMMUNITY)
Admission: RE | Admit: 2020-01-14 | Discharge: 2020-01-14 | Disposition: A | Discharge status: Home / Self Care | Payer: BC Managed Care – PPO | Source: Ambulatory Visit | Attending: General Surgery | Admitting: General Surgery

## 2020-01-14 DIAGNOSIS — G8918 Other acute postprocedural pain: Secondary | ICD-10-CM | POA: Diagnosis not present

## 2020-01-14 DIAGNOSIS — Z9889 Other specified postprocedural states: Secondary | ICD-10-CM | POA: Diagnosis not present

## 2020-01-14 DIAGNOSIS — C50411 Malignant neoplasm of upper-outer quadrant of right female breast: Secondary | ICD-10-CM

## 2020-01-14 DIAGNOSIS — N6011 Diffuse cystic mastopathy of right breast: Secondary | ICD-10-CM | POA: Diagnosis not present

## 2020-01-14 DIAGNOSIS — Z86718 Personal history of other venous thrombosis and embolism: Secondary | ICD-10-CM | POA: Diagnosis not present

## 2020-01-14 DIAGNOSIS — Z7901 Long term (current) use of anticoagulants: Secondary | ICD-10-CM | POA: Diagnosis not present

## 2020-01-14 DIAGNOSIS — Z170 Estrogen receptor positive status [ER+]: Secondary | ICD-10-CM

## 2020-01-14 DIAGNOSIS — C50911 Malignant neoplasm of unspecified site of right female breast: Secondary | ICD-10-CM | POA: Diagnosis not present

## 2020-01-14 DIAGNOSIS — Z9221 Personal history of antineoplastic chemotherapy: Secondary | ICD-10-CM | POA: Insufficient documentation

## 2020-01-14 DIAGNOSIS — C773 Secondary and unspecified malignant neoplasm of axilla and upper limb lymph nodes: Secondary | ICD-10-CM | POA: Insufficient documentation

## 2020-01-14 DIAGNOSIS — Z45811 Encounter for adjustment or removal of right breast implant: Secondary | ICD-10-CM | POA: Diagnosis not present

## 2020-01-14 DIAGNOSIS — D630 Anemia in neoplastic disease: Secondary | ICD-10-CM | POA: Diagnosis not present

## 2020-01-14 HISTORY — PX: BREAST LUMPECTOMY WITH RADIOACTIVE SEED AND SENTINEL LYMPH NODE BIOPSY: SHX6550

## 2020-01-14 HISTORY — PX: BREAST IMPLANT REMOVAL: SHX5361

## 2020-01-14 HISTORY — DX: Malignant (primary) neoplasm, unspecified: C80.1

## 2020-01-14 SURGERY — BREAST LUMPECTOMY WITH RADIOACTIVE SEED AND SENTINEL LYMPH NODE BIOPSY
Anesthesia: General | Site: Breast | Laterality: Right

## 2020-01-14 SURGERY — REMOVAL, IMPLANT, BREAST
Anesthesia: General | Site: Breast | Laterality: Right

## 2020-01-14 MED ORDER — FENTANYL CITRATE (PF) 100 MCG/2ML IJ SOLN
INTRAMUSCULAR | Status: DC | PRN
  Administered 2020-01-14: 25 ug via INTRAVENOUS

## 2020-01-14 MED ORDER — KETOROLAC TROMETHAMINE 15 MG/ML IJ SOLN
INTRAMUSCULAR | Status: AC
  Filled 2020-01-14: qty 1

## 2020-01-14 MED ORDER — CEFAZOLIN SODIUM-DEXTROSE 2-4 GM/100ML-% IV SOLN
INTRAVENOUS | Status: AC
  Filled 2020-01-14: qty 100

## 2020-01-14 MED ORDER — CEFAZOLIN SODIUM-DEXTROSE 2-4 GM/100ML-% IV SOLN
2.0000 g | INTRAVENOUS | Status: AC
  Administered 2020-01-14: 2 g via INTRAVENOUS

## 2020-01-14 MED ORDER — OXYCODONE HCL 5 MG PO TABS: 5.0000 mg | ORAL_TABLET | Freq: Once | ORAL | Status: DC | PRN

## 2020-01-14 MED ORDER — METHYLENE BLUE 0.5 % INJ SOLN
INTRAVENOUS | Status: AC
  Filled 2020-01-14: qty 20

## 2020-01-14 MED ORDER — GABAPENTIN 100 MG PO CAPS: 100.0000 mg | ORAL_CAPSULE | ORAL | Status: DC

## 2020-01-14 MED ORDER — PROPOFOL 500 MG/50ML IV EMUL
INTRAVENOUS | Status: AC
  Filled 2020-01-14: qty 50

## 2020-01-14 MED ORDER — PROPOFOL 10 MG/ML IV BOLUS
INTRAVENOUS | Status: DC | PRN
  Administered 2020-01-14: 150 mg via INTRAVENOUS
  Administered 2020-01-14: 50 mg via INTRAVENOUS

## 2020-01-14 MED ORDER — TECHNETIUM TC 99M SULFUR COLLOID FILTERED
1.0000 | Freq: Once | INTRAVENOUS | Status: AC | PRN
  Administered 2020-01-14: 1 via INTRADERMAL

## 2020-01-14 MED ORDER — LACTATED RINGERS IV SOLN: INTRAVENOUS | Status: DC

## 2020-01-14 MED ORDER — ONDANSETRON HCL 4 MG/2ML IJ SOLN: 4.0000 mg | Freq: Four times a day (QID) | INTRAMUSCULAR | Status: DC | PRN

## 2020-01-14 MED ORDER — FENTANYL CITRATE (PF) 100 MCG/2ML IJ SOLN
INTRAMUSCULAR | Status: AC
  Filled 2020-01-14: qty 2

## 2020-01-14 MED ORDER — BUPIVACAINE HCL (PF) 0.25 % IJ SOLN
INTRAMUSCULAR | Status: AC
  Filled 2020-01-14: qty 150

## 2020-01-14 MED ORDER — OXYCODONE HCL 5 MG/5ML PO SOLN: 5.0000 mg | Freq: Once | ORAL | Status: DC | PRN

## 2020-01-14 MED ORDER — SODIUM CHLORIDE (PF) 0.9 % IJ SOLN
INTRAVENOUS | Status: DC | PRN
  Administered 2020-01-14: 4 mL via INTRADERMAL

## 2020-01-14 MED ORDER — SODIUM CHLORIDE (PF) 0.9 % IJ SOLN
INTRAMUSCULAR | Status: AC
  Filled 2020-01-14: qty 20

## 2020-01-14 MED ORDER — LIDOCAINE 2% (20 MG/ML) 5 ML SYRINGE
INTRAMUSCULAR | Status: DC | PRN
  Administered 2020-01-14: 60 mg via INTRAVENOUS

## 2020-01-14 MED ORDER — KETOROLAC TROMETHAMINE 15 MG/ML IJ SOLN
15.0000 mg | INTRAMUSCULAR | Status: AC
  Administered 2020-01-14: 15 mg via INTRAVENOUS

## 2020-01-14 MED ORDER — BUPIVACAINE HCL (PF) 0.25 % IJ SOLN
INTRAMUSCULAR | Status: DC | PRN
  Administered 2020-01-14: 11 mL

## 2020-01-14 MED ORDER — ACETAMINOPHEN 500 MG PO TABS
1000.0000 mg | ORAL_TABLET | ORAL | Status: AC
  Administered 2020-01-14: 1000 mg via ORAL

## 2020-01-14 MED ORDER — FENTANYL CITRATE (PF) 100 MCG/2ML IJ SOLN
100.0000 ug | Freq: Once | INTRAMUSCULAR | Status: AC
  Administered 2020-01-14: 100 ug via INTRAVENOUS

## 2020-01-14 MED ORDER — ACETAMINOPHEN 500 MG PO TABS
ORAL_TABLET | ORAL | Status: AC
  Filled 2020-01-14: qty 2

## 2020-01-14 MED ORDER — MIDAZOLAM HCL 2 MG/2ML IJ SOLN
INTRAMUSCULAR | Status: AC
  Filled 2020-01-14: qty 2

## 2020-01-14 MED ORDER — MIDAZOLAM HCL 2 MG/2ML IJ SOLN
2.0000 mg | Freq: Once | INTRAMUSCULAR | Status: AC
  Administered 2020-01-14: 2 mg via INTRAVENOUS

## 2020-01-14 MED ORDER — ONDANSETRON HCL 4 MG/2ML IJ SOLN
INTRAMUSCULAR | Status: DC | PRN
  Administered 2020-01-14: 4 mg via INTRAVENOUS

## 2020-01-14 MED ORDER — LIDOCAINE 2% (20 MG/ML) 5 ML SYRINGE
INTRAMUSCULAR | Status: AC
  Filled 2020-01-14: qty 5

## 2020-01-14 MED ORDER — BUPIVACAINE-EPINEPHRINE (PF) 0.5% -1:200000 IJ SOLN
INTRAMUSCULAR | Status: DC | PRN
  Administered 2020-01-14: 20 mL via PERINEURAL

## 2020-01-14 MED ORDER — GABAPENTIN 100 MG PO CAPS
ORAL_CAPSULE | ORAL | Status: AC
  Filled 2020-01-14: qty 1

## 2020-01-14 MED ORDER — DEXAMETHASONE SODIUM PHOSPHATE 10 MG/ML IJ SOLN
INTRAMUSCULAR | Status: AC
  Filled 2020-01-14: qty 1

## 2020-01-14 MED ORDER — LIDOCAINE-EPINEPHRINE (PF) 1 %-1:200000 IJ SOLN
INTRAMUSCULAR | Status: AC
  Filled 2020-01-14: qty 30

## 2020-01-14 MED ORDER — DEXAMETHASONE SODIUM PHOSPHATE 4 MG/ML IJ SOLN
INTRAMUSCULAR | Status: DC | PRN
  Administered 2020-01-14: 5 mg via INTRAVENOUS

## 2020-01-14 MED ORDER — FENTANYL CITRATE (PF) 100 MCG/2ML IJ SOLN
25.0000 ug | INTRAMUSCULAR | Status: DC | PRN
  Administered 2020-01-14: 50 ug via INTRAVENOUS

## 2020-01-14 MED ORDER — LIDOCAINE-EPINEPHRINE (PF) 1 %-1:200000 IJ SOLN
INTRAMUSCULAR | Status: DC | PRN
  Administered 2020-01-14: 20 mL

## 2020-01-14 MED ORDER — ONDANSETRON HCL 4 MG/2ML IJ SOLN
INTRAMUSCULAR | Status: AC
  Filled 2020-01-14: qty 2

## 2020-01-14 MED ORDER — CEFAZOLIN SODIUM-DEXTROSE 2-4 GM/100ML-% IV SOLN: 2.0000 g | INTRAVENOUS | Status: DC

## 2020-01-14 SURGICAL SUPPLY — 88 items
ADH SKN CLS APL DERMABOND .7 (GAUZE/BANDAGES/DRESSINGS) ×2
APL PRP STRL LF DISP 70% ISPRP (MISCELLANEOUS) ×4
APL SKNCLS STERI-STRIP NONHPOA (GAUZE/BANDAGES/DRESSINGS) ×4
APPLIER CLIP 9.375 MED OPEN (MISCELLANEOUS) ×3
APR CLP MED 9.3 20 MLT OPN (MISCELLANEOUS) ×2
BAG DECANTER FOR FLEXI CONT (MISCELLANEOUS) IMPLANT
BENZOIN TINCTURE PRP APPL 2/3 (GAUZE/BANDAGES/DRESSINGS) ×4 IMPLANT
BINDER BREAST LRG (GAUZE/BANDAGES/DRESSINGS) IMPLANT
BINDER BREAST MEDIUM (GAUZE/BANDAGES/DRESSINGS) IMPLANT
BINDER BREAST XLRG (GAUZE/BANDAGES/DRESSINGS) IMPLANT
BINDER BREAST XXLRG (GAUZE/BANDAGES/DRESSINGS) IMPLANT
BLADE SURG 10 STRL SS (BLADE) ×3 IMPLANT
BLADE SURG 15 STRL LF DISP TIS (BLADE) ×2 IMPLANT
BLADE SURG 15 STRL SS (BLADE) ×3
CANISTER SUC SOCK COL 7IN (MISCELLANEOUS) IMPLANT
CANISTER SUCT 1200ML W/VALVE (MISCELLANEOUS) ×3 IMPLANT
CHLORAPREP W/TINT 26 (MISCELLANEOUS) ×6 IMPLANT
CLIP APPLIE 9.375 MED OPEN (MISCELLANEOUS) IMPLANT
CLIP VESOCCLUDE SM WIDE 6/CT (CLIP) ×2 IMPLANT
COVER BACK TABLE 60X90IN (DRAPES) ×3 IMPLANT
COVER MAYO STAND STRL (DRAPES) ×3 IMPLANT
COVER PROBE W GEL 5X96 (DRAPES) ×2 IMPLANT
COVER WAND RF STERILE (DRAPES) IMPLANT
DECANTER SPIKE VIAL GLASS SM (MISCELLANEOUS) IMPLANT
DERMABOND ADVANCED (GAUZE/BANDAGES/DRESSINGS) ×1
DERMABOND ADVANCED .7 DNX12 (GAUZE/BANDAGES/DRESSINGS) ×2 IMPLANT
DRAIN CHANNEL 15F RND FF W/TCR (WOUND CARE) IMPLANT
DRAPE GAMMA PROBE CRDLSS 10X38 (DRAPES) ×1 IMPLANT
DRAPE LAPAROSCOPIC ABDOMINAL (DRAPES) ×3 IMPLANT
DRAPE UTILITY XL STRL (DRAPES) ×3 IMPLANT
DRSG PAD ABDOMINAL 8X10 ST (GAUZE/BANDAGES/DRESSINGS) ×2 IMPLANT
ELECT BLADE 4.0 EZ CLEAN MEGAD (MISCELLANEOUS)
ELECT BLADE 6.5 EXT (BLADE) IMPLANT
ELECT COATED BLADE 2.86 ST (ELECTRODE) ×3 IMPLANT
ELECT REM PT RETURN 9FT ADLT (ELECTROSURGICAL) ×3
ELECTRODE BLDE 4.0 EZ CLN MEGD (MISCELLANEOUS) IMPLANT
ELECTRODE REM PT RTRN 9FT ADLT (ELECTROSURGICAL) ×2 IMPLANT
EVACUATOR SILICONE 100CC (DRAIN) IMPLANT
GAUZE SPONGE 4X4 12PLY STRL (GAUZE/BANDAGES/DRESSINGS) ×2 IMPLANT
GLOVE BIO SURGEON STRL SZ7 (GLOVE) ×6 IMPLANT
GLOVE BIO SURGEON STRL SZ7.5 (GLOVE) IMPLANT
GLOVE BIOGEL M STRL SZ7.5 (GLOVE) ×3 IMPLANT
GLOVE BIOGEL PI IND STRL 7.5 (GLOVE) ×2 IMPLANT
GLOVE BIOGEL PI IND STRL 8 (GLOVE) ×2 IMPLANT
GLOVE BIOGEL PI INDICATOR 7.5 (GLOVE) ×1
GLOVE BIOGEL PI INDICATOR 8 (GLOVE) ×1
GOWN STRL REUS W/ TWL LRG LVL3 (GOWN DISPOSABLE) ×4 IMPLANT
GOWN STRL REUS W/TWL LRG LVL3 (GOWN DISPOSABLE) ×15
HEMOSTAT ARISTA ABSORB 3G PWDR (HEMOSTASIS) IMPLANT
KIT MARKER MARGIN INK (KITS) ×3 IMPLANT
NDL HYPO 25X1 1.5 SAFETY (NEEDLE) ×2 IMPLANT
NDL SAFETY ECLIPSE 18X1.5 (NEEDLE) IMPLANT
NDL SPNL 18GX3.5 QUINCKE PK (NEEDLE) IMPLANT
NEEDLE HYPO 18GX1.5 SHARP (NEEDLE)
NEEDLE HYPO 25X1 1.5 SAFETY (NEEDLE) ×9 IMPLANT
NEEDLE SPNL 18GX3.5 QUINCKE PK (NEEDLE) IMPLANT
NS IRRIG 1000ML POUR BTL (IV SOLUTION) ×1 IMPLANT
PACK BASIN DAY SURGERY FS (CUSTOM PROCEDURE TRAY) ×3 IMPLANT
PENCIL SMOKE EVACUATOR (MISCELLANEOUS) ×3 IMPLANT
PIN SAFETY STERILE (MISCELLANEOUS) IMPLANT
RETRACTOR ONETRAX LX 90X20 (MISCELLANEOUS) IMPLANT
SLEEVE SCD COMPRESS KNEE MED (MISCELLANEOUS) ×3 IMPLANT
SPONGE LAP 18X18 RF (DISPOSABLE) ×6 IMPLANT
SPONGE LAP 4X18 RFD (DISPOSABLE) ×3 IMPLANT
STAPLER INSORB 30 2030 C-SECTI (MISCELLANEOUS) ×2 IMPLANT
STAPLER VISISTAT 35W (STAPLE) ×2 IMPLANT
STRIP CLOSURE SKIN 1/2X4 (GAUZE/BANDAGES/DRESSINGS) ×3 IMPLANT
SUT ETHILON 2 0 FS 18 (SUTURE) IMPLANT
SUT MNCRL AB 4-0 PS2 18 (SUTURE) ×4 IMPLANT
SUT MON AB 5-0 PS2 18 (SUTURE) ×1 IMPLANT
SUT SILK 2 0 SH (SUTURE) ×1 IMPLANT
SUT VIC AB 2-0 SH 27 (SUTURE) ×9
SUT VIC AB 2-0 SH 27XBRD (SUTURE) ×2 IMPLANT
SUT VIC AB 3-0 PS1 18 (SUTURE)
SUT VIC AB 3-0 PS1 18XBRD (SUTURE) IMPLANT
SUT VIC AB 3-0 SH 27 (SUTURE) ×6
SUT VIC AB 3-0 SH 27X BRD (SUTURE) ×2 IMPLANT
SUT VIC AB 5-0 PS2 18 (SUTURE) ×1 IMPLANT
SUT VICRYL 4-0 PS2 18IN ABS (SUTURE) IMPLANT
SWAB COLLECTION DEVICE MRSA (MISCELLANEOUS) IMPLANT
SWAB CULTURE ESWAB REG 1ML (MISCELLANEOUS) IMPLANT
SYR BULB IRRIG 60ML STRL (SYRINGE) ×2 IMPLANT
SYR CONTROL 10ML LL (SYRINGE) ×5 IMPLANT
TOWEL GREEN STERILE FF (TOWEL DISPOSABLE) ×3 IMPLANT
TRAY FAXITRON CT DISP (TRAY / TRAY PROCEDURE) ×4 IMPLANT
TUBE CONNECTING 20X1/4 (TUBING) ×3 IMPLANT
UNDERPAD 30X36 HEAVY ABSORB (UNDERPADS AND DIAPERS) ×6 IMPLANT
YANKAUER SUCT BULB TIP NO VENT (SUCTIONS) ×3 IMPLANT

## 2020-01-14 NOTE — Progress Notes (Signed)
Nuc med injections completed. Patient tolerated well.   

## 2020-01-14 NOTE — H&P (Signed)
   55 yof who has no prior breast history, has retropectoral implants from 2003 that she does not like due to size. she has recent foot surgery followed by dvt and is on xarelto. she had screening mm that shows c density breasts. there were two right breast masses. 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 there is another central right breast mass that measures 1.3x1.2x1.1 cm . there is one abnormal axillary node. biopsy of the node is positive. 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 MRI in May shows a 3.8x2.7x2.3 cm mass with single prominent node in the right axilla. she has undergone primary chemotherapy which she completed 15 September. she has done well except for needing iv fluids. she is still on xarelto and would still like implants removed. repeat mri shows complete radiologic resolution. she is here with her husband to discuss surgery   Past Surgical History Rolm Bookbinder, MD; 12/28/2019 12:56 PM) Breast Augmentation  Bilateral. Breast Biopsy  Bilateral. Foot Surgery  Bilateral.  Allergies (Chanel Teressa Senter, CMA; 12/25/2019 9:50 AM) NexIUM *ULCER DRUGS/ANTISPASMODICS/ANTICHOLINERGICS*  Doxycycline *DERMATOLOGICALS*  Hyoscyamine *CHEMICALS*  Allergies Reconciled   Medication History (Chanel Teressa Senter, CMA; 12/25/2019 9:50 AM) LORazepam (0.5MG  Tablet, Oral) Active. Dexamethasone (4MG  Tablet, Oral) Active. Enoxaparin Sodium (80MG /0.8ML Solution, Subcutaneous) Active. Lidocaine-Prilocaine (2.5-2.5% Cream, External) Active. Ondansetron HCl (8MG  Tablet, Oral) Active. Prochlorperazine Maleate (10MG  Tablet, Oral) Active. Valium (5MG  Tablet, Oral) Active. Xarelto (20MG  Tablet, Oral) Active. Medications Reconciled  Social History Rolm Bookbinder, MD; 12/28/2019 12:56 PM) Alcohol use  Occasional alcohol use. Caffeine use  Coffee. No drug use  Tobacco use  Former smoker.  Family History Rolm Bookbinder, MD; 12/28/2019 12:56 PM) Arthritis  Mother. Cervical Cancer  Family Members In General. Diabetes Mellitus  Brother, Family Members In General. Heart Disease  Family Members In General. Hypertension  Father. Thyroid problems  Brother, Family Members In [place], Father.  Ros negative  Vitals (Chanel Nolan CMA; 12/25/2019 9:51 AM) 12/25/2019 9:50 AM Weight: 166.13 lb Height: 64in Body Surface Area: 1.81 m Body Mass Index: 28.51 kg/m  Temp.: 97.65F  Pulse: 127 (Regular)        Physical Exam Rolm Bookbinder MD; 12/28/2019 12:52 PM) General Mental Status-Alert. Orientation-Oriented X3.  Breast Nipples-No Discharge. Breast Lump-No Palpable Breast Mass.  Lymphatic Head & Neck  General Head & Neck Lymphatics: Bilateral - Description - Normal. Axillary  General Axillary Region: Bilateral - Description - Normal. 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 I will coordinate with Dr Claudia Desanctis to remove implants at the same time also which is her desire. We discussed that with normal appearing nodes we can proceed with seed guided excision of prior pos node as well as sn biopsy. we discussed risks including lymphedema, shoulder dysfunction and neuropathy. We also discussed lumpectomy vs mastectomy. no advantage to mastectomy for her and she does not want to proceed with this. I discussed seed guided lumpectomy with her today. Risk of positive margin requiring second surgery. discussed need for radiotherapy. discussed recovery and risks of surgery as well. will proceed around three weeks after chemo Impression: I spent 25 minutes with patient and her husband

## 2020-01-14 NOTE — Anesthesia Procedure Notes (Signed)
Anesthesia Regional Block: Pectoralis block   Pre-Anesthetic Checklist: ,, timeout performed, Correct Patient, Correct Site, Correct Laterality, Correct Procedure, Correct Position, site marked, Risks and benefits discussed,  Surgical consent,  Pre-op evaluation,  At surgeon's request and post-op pain management  Laterality: Right  Prep: chloraprep       Needles:  Injection technique: Single-shot  Needle Type: Echogenic Needle     Needle Length: 9cm  Needle Gauge: 21     Additional Needles:   Narrative:  Start time: 01/14/2020 7:07 AM End time: 01/14/2020 7:15 AM Injection made incrementally with aspirations every 5 mL.  Performed by: Personally  Anesthesiologist: Albertha Ghee, MD  Additional Notes: Pt tolerated the procedure well.

## 2020-01-14 NOTE — Anesthesia Preprocedure Evaluation (Signed)
Anesthesia Evaluation  Patient identified by MRN, date of birth, ID band Patient awake    Reviewed: Allergy & Precautions, H&P , NPO status , Patient's Chart, lab work & pertinent test results  Airway Mallampati: II   Neck ROM: full    Dental   Pulmonary neg pulmonary ROS,    breath sounds clear to auscultation       Cardiovascular + DVT   Rhythm:regular Rate:Normal  On Xarelto   Neuro/Psych negative neurological ROS     GI/Hepatic GERD  ,  Endo/Other    Renal/GU      Musculoskeletal   Abdominal   Peds  Hematology  (+) anemia ,   Anesthesia Other Findings   Reproductive/Obstetrics                             Anesthesia Physical Anesthesia Plan  ASA: II  Anesthesia Plan: General   Post-op Pain Management:  Regional for Post-op pain   Induction: Intravenous  PONV Risk Score and Plan: 3 and Ondansetron, Dexamethasone, Midazolam and Treatment may vary due to age or medical condition  Airway Management Planned: LMA  Additional Equipment:   Intra-op Plan:   Post-operative Plan: Extubation in OR  Informed Consent: I have reviewed the patients History and Physical, chart, labs and discussed the procedure including the risks, benefits and alternatives for the proposed anesthesia with the patient or authorized representative who has indicated his/her understanding and acceptance.       Plan Discussed with: CRNA, Anesthesiologist and Surgeon  Anesthesia Plan Comments:         Anesthesia Quick Evaluation

## 2020-01-14 NOTE — Brief Op Note (Signed)
01/14/2020  8:26 AM  PATIENT:  Stephanie Brewer  54 y.o. female  PRE-OPERATIVE DIAGNOSIS:  Right breast cancer  POST-OPERATIVE DIAGNOSIS:  Right breast cancer  PROCEDURE:  Procedure(s) with comments: REMOVAL BREAST IMPLANTS (Bilateral) - 45 min for Stephanie Brewer's portion; Stephanie Brewer will follow Stephanie Brewer RIGHT BREAST LUMPECTOMY WITH RADIOACTIVE SEED AND SENTINEL LYMPH NODE BIOPSY, RIGHT BREAST AXILLARY LYMPH NODE RADIOACTIVE SEED GUIDED EXCISION (Right)  SURGEON:  Surgeon(s) and Role: Panel 1:    * Alazae Crymes, Steffanie Dunn, MD - Primary Panel 2:    * Rolm Bookbinder, MD - Primary  PHYSICIAN ASSISTANT: Phoebe Sharps, PA  ASSISTANTS: none   ANESTHESIA:   general  EBL:  5   BLOOD ADMINISTERED:none  DRAINS: none   LOCAL MEDICATIONS USED:  XYLOCAINE   SPECIMEN:  No Specimen  DISPOSITION OF SPECIMEN:  PATHOLOGY  COUNTS:  YES  TOURNIQUET:  * No tourniquets in log *  DICTATION: .Dragon Dictation  PLAN OF CARE: Discharge to home after PACU  PATIENT DISPOSITION:  PACU - hemodynamically stable.   Delay start of Pharmacological VTE agent (>24hrs) due to surgical blood loss or risk of bleeding: not applicable

## 2020-01-14 NOTE — Discharge Instructions (Signed)
Start taking xarelto tomorrow  Activity As tolerated: NO showers for 3 days No heavy activities  Diet: Regular  Wound Care: Keep dressing clean & dry for 3 days.  Keep wrap applied with compression as much as possible 24/7.    Do not change dressings for 3 days unless soiled.  Can change if needed but make sure to reapply wrap.  After three days can remove wrap and shower.  Then reapply dressings if needed and continue compression with wrap or soft sports bra.   POST OP INSTRUCTIONS Take 400 mg of ibuprofen every 8 hours or 650 mg tylenol every 6 hours for next 72 hours then as needed. Use ice several times daily also. Always review your discharge instruction sheet given to you by the facility where your surgery was performed.  IF YOU HAVE DISABILITY OR FAMILY LEAVE FORMS, YOU MUST BRING THEM TO THE OFFICE FOR PROCESSING.  DO NOT GIVE THEM TO YOUR DOCTOR.  1. A prescription for pain medication may be given to you upon discharge.  Take your pain medication as prescribed, if needed.  If narcotic pain medicine is not needed, then you may take acetaminophen (Tylenol), naprosyn (Alleve) or ibuprofen (Advil) as needed. 2. Take your usually prescribed medications unless otherwise directed 3. If you need a refill on your pain medication, please contact your pharmacy.  They will contact our office to request authorization.  Prescriptions will not be filled after 5pm or on week-ends. 4. You should eat very light the first 24 hours after surgery, such as soup, crackers, pudding, etc.  Resume your normal diet the day after surgery. 5. It is common to experience some constipation if taking pain medication after surgery.  Increasing fluid intake and taking a stool softener will usually help or prevent this problem from occurring.  A mild laxative (Milk of Magnesia or Miralax) should be taken according to package directions if there are no bowel movements after 48 hours. 6. Unless discharge instructions  indicate otherwise, you may remove your bandages 48 hours after surgery and you may shower at that time.  You may have steri-strips (small skin tapes) in place directly over the incision.  These strips should be left on the skin for 7-10 days and will come off on their own.  If your surgeon used skin glue on the incision, you may shower in 24 hours.  The glue will flake off over the next 2-3 weeks.  Any sutures or staples will be removed at the office during your follow-up visit. 7. ACTIVITIES:  You may resume regular daily activities (gradually increasing) beginning the next day.  Wearing a good support bra or sports bra minimizes pain and swelling.  You may have sexual intercourse when it is comfortable. a. You may drive when you no longer are taking prescription pain medication, you can comfortably wear a seatbelt, and you can safely maneuver your car and apply brakes. b. RETURN TO WORK:  ______________________________________________________________________________________ 8. You should see your doctor in the office for a follow-up appointment approximately two weeks after your surgery.  Your doctor's nurse will typically make your follow-up appointment when she calls you with your pathology report.  Expect your pathology report 3-4 business days after your surgery.  You may call to check if you do not hear from [place] after three days. 9. OTHER INSTRUCTIONS: _______________________________________________________________________________________________ _____________________________________________________________________________________________________________________________________ _____________________________________________________________________________________________________________________________________ _____________________________________________________________________________________________________________________________________  WHEN TO CALL DR WAKEFIELD: 1. Fever over 101.0 2. Nausea  and/or vomiting. 3. Extreme swelling or bruising. 4. Continued bleeding  from incision. 5. Increased pain, redness, or drainage from the incision.  The clinic staff is available to answer your questions during regular business hours.  Please don't hesitate to call and ask to speak to one of the nurses for clinical concerns.  If you have a medical emergency, go to the nearest emergency room or call 911.  A surgeon from [facility] is always on call at the hospital.  For further questions, please visit [email or web address]    Post Anesthesia Home Care Instructions  Activity: Get plenty of rest for the remainder of the day. A responsible individual must stay with you for 24 hours following the procedure.  For the next 24 hours, DO NOT: -Drive a car -Paediatric nurse -Drink alcoholic beverages -Take any medication unless instructed by your physician -Make any legal decisions or sign important papers.  Meals: Start with liquid foods such as gelatin or soup. Progress to regular foods as tolerated. Avoid greasy, spicy, heavy foods. If nausea and/or vomiting occur, drink only clear liquids until the nausea and/or vomiting subsides. Call your physician if vomiting continues.  Special Instructions/Symptoms: Your throat may feel dry or sore from the anesthesia or the breathing tube placed in your throat during surgery. If this causes discomfort, gargle with warm salt water. The discomfort should disappear within 24 hours.  If you had a scopolamine patch placed behind your ear for the management of post- operative nausea and/or vomiting:  1. The medication in the patch is effective for 72 hours, after which it should be removed.  Wrap patch in a tissue and discard in the trash. Wash hands thoroughly with soap and water. 2. You may remove the patch earlier than 72 hours if you experience unpleasant side effects which may include dry mouth, dizziness or visual  disturbances. 3. Avoid touching the patch. Wash your hands with soap and water after contact with the patch.     No tylenol until after 1pm if needed to day.  No ibuprofen until after 3pm if needed today

## 2020-01-14 NOTE — Progress Notes (Signed)
  Assisted Dr. Hodierne with right, ultrasound guided, pectoralis block. Side rails up, monitors on throughout procedure. See vital signs in flow sheet. Tolerated Procedure well. 

## 2020-01-14 NOTE — Op Note (Signed)
Operative Note   DATE OF OPERATION: 01/14/2020  SURGICAL DEPARTMENT: Plastic Surgery  PREOPERATIVE DIAGNOSES: Right breast cancer  POSTOPERATIVE DIAGNOSES:  same  PROCEDURE: Removal of bilateral breast implants  SURGEON: Talmadge Coventry, MD  ASSISTANT: Phoebe Sharps, PA The advanced practice practitioner (APP) assisted throughout the case.  The APP was essential in retraction and counter traction when needed to make the case progress smoothly.  This retraction and assistance made it possible to see the tissue plans for the procedure.  The assistance was needed for blood control, tissue re-approximation and assisted with closure of the incision site.  ANESTHESIA:  General.   COMPLICATIONS: None.   INDICATIONS FOR PROCEDURE:  The patient, Stephanie Brewer is a 54 y.o. female born on 08-14-1965, is here for treatment of breast implants in the setting of right-sided breast cancer.  To facilitate her cancer treatment and to streamline her subsequent reconstructive process we will plan to remove the implants at this early stage and then follow the outcomes of her cancer treatment and make decisions regarding her subsequent reconstruction after that has completed. MRN: [id number]  CONSENT:  Informed consent was obtained directly from the patient. Risks, benefits and alternatives were fully discussed. Specific risks including but not limited to bleeding, infection, hematoma, seroma, scarring, pain, contracture, asymmetry, wound healing problems, and need for further surgery were all discussed. The patient did have an ample opportunity to have questions answered to satisfaction.   DESCRIPTION OF PROCEDURE:  The patient was taken to the operating room. SCDs were placed and antibiotics were given.  General anesthesia was administered.  The patient's operative site was prepped and draped in a sterile fashion. A time out was performed and all information was confirmed to be correct.  I started by  infiltrating lidocaine with epinephrine along her bilateral inframammary incisions.  I started on the right side and made an incision with a knife.  I dissected down to the implant with cautery.  The implant was then removed without any issue.  Hemostasis was obtained and the wound was closed with thereafter buried 3-0 PDS sutures and a running 3 OV lock.  I then turned my attention to the left side.  Incision was made along her pre-existing inframammary scar.  I dissected down to the implant with cautery.  It was removed without issue.  Hemostasis was obtained and the wound was closed with interrupted buried 3-0 PDS sutures and a running 3 OV lock.  Steri-Strips were then applied.  Patient was then turned over to Dr. Donne Hazel for his portion of the case.  The patient tolerated the procedure well.  There were no complications.

## 2020-01-14 NOTE — Interval H&P Note (Signed)
History and Physical Interval Note:  01/14/2020 7:24 AM  [name]  has presented today for surgery, with the diagnosis of breast cancer.  The various methods of treatment have been discussed with the patient and family. After consideration of risks, benefits and other options for treatment, the patient has consented to  Procedure(s) with comments: REMOVAL BREAST IMPLANTS (Bilateral) - 45 min for Anacristina Steffek's portion; Donne Hazel will follow Dangela How RIGHT BREAST LUMPECTOMY WITH RADIOACTIVE SEED AND SENTINEL LYMPH NODE BIOPSY, RIGHT BREAST AXILLARY LYMPH NODE RADIOACTIVE SEED GUIDED EXCISION (Right) as a surgical intervention.  The patient's history has been reviewed, patient examined, no change in status, stable for surgery.  I have reviewed the patient's chart and labs.  Questions were answered to the patient's satisfaction.     Cindra Presume

## 2020-01-14 NOTE — Op Note (Signed)
Preoperative diagnosis: Right breast cancer status post primary chemotherapy Postoperative diagnosis: Same as above Procedure: 1.  Right breast seed guided lumpectomy 2.  Right deep axillary sentinel lymph node biopsy 3.  Injection of blue dye for sentinel node identification Surgeon: Dr. Serita Grammes Anesthesia: General with pectoral block Drains: None Specimens: 1.  Right breast lumpectomy marked with paint containing inferior seed 2.  Right breast lumpectomy marked with paint containing seed and clip 3.  Additional anterior and superior margins from specimen 2 marked short superior, long lateral, double deep 4.  Right deep axillary sentinel lymph nodes with highest count of 5366 Complications: None Sponge and needle count was correct at completion Disposition to recovery stable condition  Indications: 25 yof who has no prior breast history, has retropectoral implants from 2003 that she does not like due to size. she has recent foot surgery followed by dvt and is on xarelto. she had screening mm that shows c density breasts. there were two right breast masses. 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 there is another central right breast mass that measures 1.3x1.2x1.1 cm . there is one abnormal axillary node. biopsy of the node is positive. 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 MRI in May shows a 3.8x2.7x2.3 cm mass with single prominent node in the right axilla. she has undergone primary chemotherapy which she completed 15 September. she has done well except for needing iv fluids. she is still on xarelto and would still like implants removed. repeat mri shows complete radiologic resolution.  We elected to proceed with lumpectomy.  She had a seed placed in the breast preoperatively but the first seed was not placed at the right position.  This is about 2 cm away from the mass.  She had another placed at the clip.  The  hydro-Mark clip in the axilla was unable to be identified so we will just proceed with sentinel node biopsy.  Procedure: After informed consent was obtained the patient on first underwent a pectoral block.  She was injected with technetium in the standard periareolar fashion.  Antibiotics were given and SCDs were in place.  She was placed under general anesthesia without complication.  A surgical timeout was then performed.  I prepped the area around the areola.  I then did an injection of methylene blue dye in the subareolar position.  I then massaged this.  The case was then turned over to Dr. Claudia Desanctis for implant removal.  I returned after his portion of the procedure was done.  I identified the location of the seeds in the central breast.  I made a periareolar incision in order to hide the scar later.  I then used the neoprobe to first remove the inferior seed that was placed not at the lesion.  Mammogram confirmed removal of the seed.  I then remove the seed that was associated with the clip and the prior tumor mammogram confirmed removal of the seed and the clip.  Both of the specimens were marked with paint.  On the mammogram I thought it might be close to the superior and anterior margin so I did excise these and marked these as above.  I then obtained hemostasis.  I placed clips in the cavity to mark it for radiation later.  I then closed the breast tissue with 2-0 Vicryl.  Skin was closed with 3-0 Vicryl and 5-0 Monocryl.  Glue was eventually placed.  Identified the location of the sentinel node in the low axilla.  I filtrated Marcaine and made a curvilinear incision right below the axillary hairline.  I carried this through the axillary fascia.  I identified several radioactive nodes with the highest count listed above.  There was no blue dye present in the axilla.  Once I remove these the background radioactivity was negligible.  I then obtained hemostasis.  I closed the axillary fascia with 2-0  Vicryl.  The skin was closed with 3-0 Vicryl and 4-0 Monocryl.  Glue was placed.  She tolerated this well was extubated transferred to recovery stable.

## 2020-01-14 NOTE — Anesthesia Postprocedure Evaluation (Signed)
Anesthesia Post Note  Patient: Stephanie Brewer  Procedure(s) Performed: REMOVAL BREAST IMPLANTS (Bilateral Breast) Right breast seed localized lumpectomy with sentinel lymph node biopsy (Right Breast)     Patient location during evaluation: PACU Anesthesia Type: General Level of consciousness: awake and alert Pain management: pain level controlled Vital Signs Assessment: post-procedure vital signs reviewed and stable Respiratory status: spontaneous breathing, nonlabored ventilation, respiratory function stable and patient connected to nasal cannula oxygen Cardiovascular status: blood pressure returned to baseline and stable Postop Assessment: no apparent nausea or vomiting Anesthetic complications: no   No complications documented.  Last Vitals:  Vitals:   01/14/20 0945 01/14/20 1014  BP: 123/86 126/85  Pulse: 86 91  Resp: 17 16  Temp:  (!) 36.4 C  SpO2: 100% 100%    Last Pain:  Vitals:   01/14/20 1014  TempSrc:   PainSc: 2                  Perris Tripathi S

## 2020-01-14 NOTE — Anesthesia Procedure Notes (Signed)
Procedure Name: LMA Insertion Date/Time: 01/14/2020 7:40 AM Performed by: Maryella Shivers, CRNA Pre-anesthesia Checklist: Patient identified, Emergency Drugs available, Suction available and Patient being monitored Patient Re-evaluated:Patient Re-evaluated prior to induction Oxygen Delivery Method: Circle system utilized Preoxygenation: Pre-oxygenation with 100% oxygen Induction Type: IV induction Ventilation: Mask ventilation without difficulty LMA: LMA inserted LMA Size: 4.0 Number of attempts: 1 Airway Equipment and Method: Bite block Placement Confirmation: positive ETCO2 Tube secured with: Tape Dental Injury: Teeth and Oropharynx as per pre-operative assessment

## 2020-01-14 NOTE — Interval H&P Note (Signed)
History and Physical Interval Note:  01/14/2020 7:22 AM  Stephanie Brewer  has presented today for surgery, with the diagnosis of breast cancer.  The various methods of treatment have been discussed with the patient and family. After consideration of risks, benefits and other options for treatment, the patient has consented to  Procedure(s) with comments: REMOVAL BREAST IMPLANTS (Bilateral) - 45 min for Pace's portion; Donne Hazel will follow Pace RIGHT BREAST LUMPECTOMY WITH RADIOACTIVE SEED AND SENTINEL LYMPH NODE BIOPSY, RIGHT BREAST AXILLARY LYMPH NODE RADIOACTIVE SEED GUIDED EXCISION (Right) as a surgical intervention.  The patient's history has been reviewed, patient examined, no change in status, stable for surgery.  I have reviewed the patient's chart and labs.  Questions were answered to the patient's satisfaction.     Rolm Bookbinder

## 2020-01-14 NOTE — Transfer of Care (Signed)
Immediate Anesthesia Transfer of Care Note  Patient: Stephanie Brewer  Procedure(s) Performed: REMOVAL BREAST IMPLANTS (Bilateral Breast) Right breast seed localized lumpectomy with sentinel lymph node biopsy (Right Breast)  Patient Location: PACU  Anesthesia Type:GA combined with regional for post-op pain  Level of Consciousness: awake, alert  and oriented  Airway & Oxygen Therapy: Patient Spontanous Breathing and Patient connected to nasal cannula oxygen  Post-op Assessment: Report given to RN and Post -op Vital signs reviewed and stable  Post vital signs: Reviewed and stable  Last Vitals:  Vitals Value Taken Time  BP 115/82 01/14/20 0930  Temp    Pulse 89 01/14/20 0933  Resp 18 01/14/20 0933  SpO2 99 % 01/14/20 0933  Vitals shown include unvalidated device data.  Last Pain:  Vitals:   01/14/20 0630  TempSrc: Oral  PainSc: 0-No pain         Complications: No complications documented.

## 2020-01-15 ENCOUNTER — Encounter (HOSPITAL_BASED_OUTPATIENT_CLINIC_OR_DEPARTMENT_OTHER): Payer: Self-pay | Admitting: Plastic Surgery

## 2020-01-19 LAB — SURGICAL PATHOLOGY

## 2020-01-20 ENCOUNTER — Encounter: Payer: Self-pay | Admitting: *Deleted

## 2020-01-25 ENCOUNTER — Ambulatory Visit (INDEPENDENT_AMBULATORY_CARE_PROVIDER_SITE_OTHER): Payer: BC Managed Care – PPO | Admitting: Plastic Surgery

## 2020-01-25 ENCOUNTER — Encounter: Payer: Self-pay | Admitting: Plastic Surgery

## 2020-01-25 ENCOUNTER — Other Ambulatory Visit: Payer: Self-pay

## 2020-01-25 VITALS — BP 121/80 | HR 90 | Temp 98.2°F

## 2020-01-25 DIAGNOSIS — Z170 Estrogen receptor positive status [ER+]: Secondary | ICD-10-CM

## 2020-01-25 DIAGNOSIS — C50411 Malignant neoplasm of upper-outer quadrant of right female breast: Secondary | ICD-10-CM

## 2020-01-25 NOTE — Progress Notes (Signed)
Location of Breast Cancer: Malignant neoplasm of upper-outer quadrant of RIGHT breast, estrogen receptor positive   Histology per Pathology Report:  01/14/2020 FINAL MICROSCOPIC DIAGNOSIS:  A. BREAST, RIGHT INFERIOR, LUMPECTOMY:  - Benign breast parenchyma with mild fibrocystic change  - Negative for carcinoma  B. BREAST, RIGHT, LUMPECTOMY:  - No evidence of residual carcinoma  - Therapy-related changes, including fibrosis, inflammation and  calcifications  - Fibrocystic changes  - See comment  C. LYMPH NODE, RIGHT AXILLARY, SENTINEL, EXCISION:  - Lymph node, negative for carcinoma (0/1)  D. LYMPH NODE, RIGHT AXILLARY, SENTINEL, EXCISION:  - Lymph node, negative for carcinoma (0/1)  E. LYMPH NODE, RIGHT AXILLARY, SENTINEL, EXCISION:  - Lymph node, negative for carcinoma (0/1)  F. LYMPH NODE, RIGHT AXILLARY, SENTINEL, EXCISION:  - Lymph node, negative for carcinoma (0/1)  G. BREAST, RIGHT ADDITIONAL SUPERIOR MARGIN, EXCISION:  - Benign breast parenchyma with mild fibrocystic change  - Negative for carcinoma  H. BREAST, RIGHT ADDITIONAL ANTERIOR MARGIN, EXCISION:  - Benign breast parenchyma with no specific histopathologic changes  - Negative for carcinoma  COMMENT:  The appropriate pathologic stage is ypT0 ypN0   Receptor Status: from 08/12/2019 biopsy--ER(10%), PR (0%), Her2-neu (positive), Ki-67(45%)  Did patient present with symptoms (if so, please note symptoms) or was this found on screening mammography?:  Screening mammogram detected right breast mass 11:30 position subareolar 1.3 cm, indeterminate 5 mm mass  Past/Anticipated interventions by surgeon, if any: 01/14/2020 --Dr. Rolm Bookbinder 1.  Right breast seed guided lumpectomy 2.  Right deep axillary sentinel lymph node biopsy 3.  Injection of blue dye for sentinel node identification --Dr. Mingo Amber Removal of bilateral breast implants  Past/Anticipated interventions by medical oncology, if any:  Under  care of Dr. Nicholas Lose Treatment plan: 1. Neoadjuvant chemotherapy with TCH Perjeta 6 cycles completed 12/16/19 (Perjeta D/Ced due to diarrhea) followed by Herceptin versus Kadcylamaintenance for 1 year 2. Followed by breast conserving surgery with sentinel lymph node study 3. Followed by adjuvant radiation therapy if patient had lumpectomy RTC after surgery (scheduled for 01/27/2020) to discuss further adjuvant treatment plan (Herceptin vs Kadcyla Vs Compass Her 2) Continue with Herceptin maintenance  I filled out the paperwork for her disability short-term application  Lymphedema issues, if any:  Patient denies    Pain issues, if any:  Patient denies   SAFETY ISSUES:  Prior radiation? No  Pacemaker/ICD? No  Possible current pregnancy? No  Is the patient on methotrexate? No  Current Complaints / other details:  Taking Xarelto for DVT she developed after foot surgery

## 2020-01-25 NOTE — Progress Notes (Signed)
Patient is postop from removal of bilateral breast implants.  She had a lumpectomy at the same time along with sentinel lymph node biopsies which showed a complete pathologic response to neoadjuvant chemotherapy with no evidence of disease.  She feels like she is healed up great from her surgery overall and has had minimal pain.  She does feel like she is quite a bit smaller than she was before and I believe her implants were about 450 cc in volume.  She is scheduled to meet with the radiation oncologist tomorrow and her medical oncologist sometime this week and she is hoping to get further answers in terms of what additional adjuvant treatment is required.  I'll plan to meet with her again about 3 months from now after she is completed radiation to discuss how to move forward with the reconstructive side.  If she has any additional problems or issues with healing in the meantime she knows she can call the office or come in for an appointment at any point.  All of her questions were answered.

## 2020-01-26 ENCOUNTER — Encounter: Payer: Self-pay | Admitting: *Deleted

## 2020-01-26 ENCOUNTER — Ambulatory Visit
Admission: RE | Admit: 2020-01-26 | Discharge: 2020-01-26 | Disposition: A | Discharge status: Home / Self Care | Payer: BC Managed Care – PPO | Source: Ambulatory Visit | Attending: Radiation Oncology | Admitting: Radiation Oncology

## 2020-01-26 ENCOUNTER — Encounter: Payer: Self-pay | Admitting: Radiation Oncology

## 2020-01-26 ENCOUNTER — Other Ambulatory Visit: Payer: Self-pay

## 2020-01-26 ENCOUNTER — Other Ambulatory Visit: Payer: Self-pay | Admitting: Radiation Oncology

## 2020-01-26 ENCOUNTER — Other Ambulatory Visit: Payer: Self-pay | Admitting: *Deleted

## 2020-01-26 VITALS — BP 140/83 | HR 78 | Temp 97.9°F | Resp 17 | Wt 172.0 lb

## 2020-01-26 DIAGNOSIS — Z170 Estrogen receptor positive status [ER+]: Secondary | ICD-10-CM

## 2020-01-26 DIAGNOSIS — N6341 Unspecified lump in right breast, subareolar: Secondary | ICD-10-CM | POA: Insufficient documentation

## 2020-01-26 DIAGNOSIS — Z9889 Other specified postprocedural states: Secondary | ICD-10-CM | POA: Diagnosis not present

## 2020-01-26 DIAGNOSIS — C50411 Malignant neoplasm of upper-outer quadrant of right female breast: Secondary | ICD-10-CM | POA: Diagnosis not present

## 2020-01-26 DIAGNOSIS — Z7901 Long term (current) use of anticoagulants: Secondary | ICD-10-CM | POA: Diagnosis not present

## 2020-01-26 DIAGNOSIS — Z79899 Other long term (current) drug therapy: Secondary | ICD-10-CM | POA: Insufficient documentation

## 2020-01-26 DIAGNOSIS — Z86718 Personal history of other venous thrombosis and embolism: Secondary | ICD-10-CM | POA: Insufficient documentation

## 2020-01-26 DIAGNOSIS — Z9221 Personal history of antineoplastic chemotherapy: Secondary | ICD-10-CM | POA: Diagnosis not present

## 2020-01-26 DIAGNOSIS — N6011 Diffuse cystic mastopathy of right breast: Secondary | ICD-10-CM | POA: Diagnosis not present

## 2020-01-26 NOTE — Progress Notes (Signed)
Radiation Oncology         [phone] ________________________________  Initial Outpatient Consultation  Name: Stephanie Brewer MRN: [id number]  Date: 01/26/2020  DOB: [birth year]  ME:BRAXENMMHW, Koleen Distance, DO  Nicholas Lose, MD   REFERRING PHYSICIAN: Nicholas Lose, MD  DIAGNOSIS:    ICD-10-CM   1. Malignant neoplasm of upper-outer quadrant of right breast in female, estrogen receptor positive ([place])  C50.411    Z17.0     Cancer Staging Malignant neoplasm of upper-outer quadrant of right breast in female, estrogen receptor positive ([place]) Staging form: Breast, AJCC 8th Edition - Clinical stage from 08/20/2019: Stage IIA (cT2, cN1, cM0, G2, ER+, PR-, HER2+) - Signed by Eppie Gibson, MD on 01/26/2020 ypT0,ypN0  CHIEF COMPLAINT: Here to discuss management of right breast cancer  HISTORY OF PRESENT ILLNESS::Stephanie Brewer is a 54 y.o. female who presented with breast abnormality on the following imaging: bilateral screening mammogram on the date of 07/28/2019. No symptoms were reported at that time. Ultrasound of right breast on 08/05/2019 revealed a 1.3 cm mass at the 11:30 o'clock retroareolar right breast with imaging features suspicious for malignancy. It also showed a 5 mm indeterminate mass at the 11:30 o'clock position of the right breast that was 5 cm from the nipple and could possibly represent a metastatic intramammary lymph node. Finally, there was a single right axillary lymph node with borderline cortical thickening that was suspicious for a metastatic node. Biopsy on the date of 08/12/2019 showed invasive ductal carcinoma involving all three of the biopsy specimens (right breast, right breast subareolar, and right axillary lymph node). ER status: 10% weak; PR status: 0% negative; Her2 status: positive; Grade 2.  MRI of bilateral breasts on 08/26/2019 showed the known malignancy in the right retroareolar breast that measured 3.8 x 2.7 x 2.3 cm. There was also noted to be a single  mildly prominent node in the right axilla. There was no malignancy in the left breast.  The patient underwent six cycles of neoadjuvant chemotherapy with [place] (completed 12/16/2019) under the care of [name]. Of note, Perjeta was discontinued due to diarrhea.  MRI of bilateral breasts on 12/21/2019 showed resolution of the previously identified right-sided malignancy without evidence of malignancy in either breast.  The patient underwent removal of bilateral breast implants and a right breast lumpectomy with right deep axillary sentinel lymph node biopsy on 01/14/2020. Pathology from the procedure revealed therapy-related changes, including fibrosis, inflammation, and calcifications in addition to benign breast parenchyma with mild fibrocystic change. However, there was no evidence of residual carcinoma. Superior and anterior margins were negative for carcinoma. Four right axillary sentinel lymph nodes were excised and all were negative for carcinoma.   Receptor Status: from 08/12/2019 biopsy--ER(10%), PR (0%), Her2-neu (positive), Ki-67(45%)  Did patient present with symptoms (if so, please note symptoms) or was this found on screening mammography?:  Screening mammogram detected right breast mass 11:30 position subareolar 1.3 cm, indeterminate 5 mm mass  Past/Anticipated interventions by surgeon, if any: 01/14/2020 --Dr. Rolm Bookbinder 1.  Right breast seed guided lumpectomy 2.  Right deep axillary sentinel lymph node biopsy 3.  Injection of blue dye for sentinel node identification --Dr. Mingo Amber Removal of bilateral breast implants  Past/Anticipated interventions by medical oncology, if any:  Under care of Dr. Nicholas Lose Treatment plan: 1. Neoadjuvant chemotherapy with TCH Perjeta 6 cycles completed 12/16/19 (Perjeta D/Ced due to diarrhea) followed by Herceptin versus Kadcylamaintenance for 1 year 2. Followed by breast conserving surgery with  sentinel lymph node  study 3. Followed by adjuvant radiation therapy if patient had lumpectomy RTC after surgery (scheduled for 01/27/2020) to discuss further adjuvant treatment plan (Herceptin vs Kadcyla Vs Compass Her 2) Continue with Herceptin maintenance  I filled out the paperwork for her disability short-term application  Lymphedema issues, if any:  Patient denies    Pain issues, if any:  Patient denies   SAFETY ISSUES:  Prior radiation? No  Pacemaker/ICD? No  Possible current pregnancy? No  Is the patient on methotrexate? No  Current Complaints / other details:  Taking Xarelto for DVT she developed after foot surgery   PREVIOUS RADIATION THERAPY: No  PAST MEDICAL HISTORY:  has a past medical history of Cancer ([place]) and DVT (deep venous thrombosis) ([place]).    PAST SURGICAL HISTORY: Past Surgical History:  Procedure Laterality Date  . BREAST IMPLANT REMOVAL Bilateral 01/14/2020   Procedure: REMOVAL BREAST IMPLANTS;  Surgeon: Cindra Presume, MD;  Location: [place];  Service: Plastics;  Laterality: Bilateral;  . BREAST LUMPECTOMY WITH RADIOACTIVE SEED AND SENTINEL LYMPH NODE BIOPSY Right 01/14/2020   Procedure: Right breast seed localized lumpectomy with sentinel lymph node biopsy;  Surgeon: Rolm Bookbinder, MD;  Location: [place];  Service: General;  Laterality: Right;  . BREAST SURGERY     Augmentation 2003  . HERNIA REPAIR  2002  . PORTACATH PLACEMENT N/A 09/01/2019   Procedure: INSERTION PORT-A-CATH WITH ULTRASOUND GUIDANCE;  Surgeon: Rolm Bookbinder, MD;  Location: [place];  Service: General;  Laterality: N/A;    FAMILY HISTORY: family history includes Hypertension in her father.  SOCIAL HISTORY:  reports that she has never smoked. She has never used smokeless tobacco. She reports previous alcohol use. She reports that she does not use drugs.  ALLERGIES: Hyoscyamine, Doxycycline, and Nexium [esomeprazole]  MEDICATIONS:   Current Outpatient Medications  Medication Sig Dispense Refill  . rivaroxaban (XARELTO) 20 MG TABS tablet Take 1 tablet (20 mg total) by mouth daily with supper. x5 months. Then stop. 30 tablet 4  . ALLERGY CHILDRENS 12.5 MG/5ML liquid SWISH & SPIT 1WTEASPOONFUL 4 TIMES A DAYTAS NEEDED FOR MOUTH PAIN (Patient not taking: Reported on 01/25/2020)    . diphenoxylate-atropine (LOMOTIL) 2.5-0.025 MG tablet 1 to 2 four times daily as needed for diarrhea (Patient not taking: Reported on 01/26/2020) 60 tablet 3  . furosemide (LASIX) 20 MG tablet Take 1 tablet (20 mg total) by mouth 2 (two) times daily. (Patient not taking: Reported on 01/26/2020) 60 tablet 0  . lidocaine (XYLOCAINE) 2 % solution SWISH & SPIT 1WTEASPOONFUL 4 TIMES A DAYTAS NEEDED FOR MOUTH PAIN (Patient not taking: Reported on 01/25/2020)    . lidocaine (XYLOCAINE) 2 % solution Use as directed 5 mLs in the mouth or throat as needed for mouth pain. (Patient not taking: Reported on 01/26/2020) 200 mL 1  . magic mouthwash w/lidocaine SOLN Take 5 mLs by mouth 4 (four) times daily as needed for mouth pain. (Patient not taking: Reported on 01/26/2020) 120 mL 0  . Minocycline HCl 90 MG TB24 Take 1 tablet by mouth daily. Only takes prn for break outs (Patient not taking: Reported on 01/26/2020)    . nystatin (MYCOSTATIN) 100000 UNIT/ML suspension SMARTSIG:1 Teaspoon By Mouth 4 Times Daily PRN (Patient not taking: Reported on 01/25/2020)    . potassium chloride SA (KLOR-CON) 20 MEQ tablet Take 1 tablet (20 mEq total) by mouth 2 (two) times daily. (Patient not taking: Reported on 01/25/2020) 60 tablet 0  No current facility-administered medications for this encounter.   Facility-Administered Medications Ordered in Other Encounters  Medication Dose Route Frequency Provider Last Rate Last Admin  . sodium chloride flush (NS) 0.9 % injection 10 mL  10 mL Intravenous PRN Nicholas Lose, MD   10 mL at 09/02/19 0839    REVIEW OF SYSTEMS: As above.    PHYSICAL EXAM:  weight is 172 lb (78 kg). Her oral temperature is 97.9 F (36.6 C). Her blood pressure is 140/83 and her pulse is 78. Her respiration is 17 and oxygen saturation is 100%.   General: Alert and oriented, in no acute distress HEENT: Head is normocephalic. Extraocular movements are intact. Heart: Regular in rate and rhythm with no murmurs, rubs, or gallops. Chest: Clear to auscultation bilaterally, with no rhonchi, wheezes, or rales. Skin: Evidence of chronic sun exposure to the chest   Musculoskeletal: symmetric strength and muscle tone throughout. Psychiatric: Judgment and insight are intact. Affect is appropriate. Breasts: Scars healing well over bilateral breasts and right axilla.     ECOG = 0  0 - Asymptomatic (Fully active, able to carry on all predisease activities without restriction)  1 - Symptomatic but completely ambulatory (Restricted in physically strenuous activity but ambulatory and able to carry out work of a light or sedentary nature. For example, light housework, office work)  2 - Symptomatic, <50% in bed during the day (Ambulatory and capable of all self care but unable to carry out any work activities. Up and about more than 50% of waking hours)  3 - Symptomatic, >50% in bed, but not bedbound (Capable of only limited self-care, confined to bed or chair 50% or more of waking hours)  4 - Bedbound (Completely disabled. Cannot carry on any self-care. Totally confined to bed or chair)  5 - Death   Eustace Pen MM, Creech RH, Tormey DC, et al. [phone]). "Toxicity and response criteria of the [facility] Group". [place] Oncol. 5 (6): 649-55   LABORATORY DATA:  Lab Results  Component Value Date   WBC 2.9 (L) 01/06/2020   HGB 9.7 (L) 01/06/2020   HCT 30.0 (L) 01/06/2020   MCV 94.3 01/06/2020   PLT 194 01/06/2020   CMP     Component Value Date/Time   NA 141 01/11/2020 1222   NA 140 07/20/2019 1405   K 3.8 01/11/2020 1222   CL 106  01/11/2020 1222   CO2 26 01/11/2020 1222   GLUCOSE 100 (H) 01/11/2020 1222   BUN 5 (L) 01/11/2020 1222   BUN 10 07/20/2019 1405   CREATININE 0.61 01/11/2020 1222   CREATININE 0.59 01/06/2020 0826   CALCIUM 8.9 01/11/2020 1222   PROT 5.5 (L) 01/06/2020 0826   PROT 7.4 07/20/2019 1405   ALBUMIN 3.1 (L) 01/06/2020 0826   ALBUMIN 4.4 07/20/2019 1405   AST 22 01/06/2020 0826   ALT 18 01/06/2020 0826   ALKPHOS 83 01/06/2020 0826   BILITOT 0.3 01/06/2020 0826   GFRNONAA >60 01/11/2020 1222   GFRNONAA >60 01/06/2020 0826   GFRAA >60 12/16/2019 0810         RADIOGRAPHY: NM Sentinel Node Inj-No Rpt (Breast)  Result Date: 01/14/2020 Sulfur colloid was injected by the nuclear medicine technologist for melanoma sentinel node.   MM BREAST SURGICAL SPECIMEN  Result Date: 01/14/2020 CLINICAL DATA:  Right lumpectomy for breast cancer. EXAM: SPECIMEN RADIOGRAPH OF THE RIGHT BREAST COMPARISON:  Previous exam(s). FINDINGS: Status post excision of the right breast. Two specimens are submitted interpretation. The  1st specimen contains a radioactive seed and ribbon shaped biopsy marker clip. The 2nd specimen contains a 2nd radioactive seed placed at the time of localization. IMPRESSION: Specimen radiograph of the right breast. Electronically Signed   By: Claudie Revering M.D.   On: 01/14/2020 08:43   MM RT RADIOACTIVE SEED LOC MAMMO GUIDE  Result Date: 01/13/2020 CLINICAL DATA:  Right breast cancer for seed localization prior to surgery EXAM: MAMMOGRAPHIC GUIDED RADIOACTIVE SEED LOCALIZATION OF THE right BREAST COMPARISON:  Previous exam(s). FINDINGS: Patient presents for radioactive seed localization prior to right breast surgery. I met with the patient and we discussed the procedure of seed localization including benefits and alternatives. We discussed the high likelihood of a successful procedure. We discussed the risks of the procedure including infection, bleeding, tissue injury and further surgery. We  discussed the low dose of radioactivity involved in the procedure. Informed, written consent was given. The usual time-out protocol was performed immediately prior to the procedure. Using mammographic guidance, sterile technique, 1% lidocaine and an I-125 radioactive seed, ribbon biopsy clip was localized using a cranial approach. The follow-up mammogram images shows the seed is 2 cm inferior to the ribbon biopsy clip. Therefore, a second seed was placed via the cranial approach. The second seed is at the biopsy clip of concern. Images are marked for Dr. Donne Hazel. Follow-up survey of the patient confirms presence of the radioactive seed. Seed one/ inferior seed not at clip: Order number of I-125 seed: [id number]. Total activity:  6.333 millicuries reference Date: December 10, 2019 Seed two/ superior seed at the clip: order number of I-125 seed: [id number]. Total activity:  9.373 millicuries reference Date: December 10, 2019 Ultrasound scan was also performed in the right axilla to look for [facility] clip. However, the [facility] clip cannot be clearly seen on ultrasound and therefore there is no seed placed in the right axilla. The patient tolerated the procedure well and was released from the [facility]. She was given instructions regarding seed removal. IMPRESSION: Radioactive seed localization right breast. There are 2 seeds in the right breast. The mammogram following the first seed placement showed the seed is 2 cm inferior to the ribbon biopsy clip. Therefore, a second seed was placed via the cranial approach. The second seed is at the biopsy clip of concern. Images are marked for Dr. Donne Hazel. Ultrasound scan was also performed in the right axilla to look for [facility] clip. However, the [facility] clip cannot be clearly seen on ultrasound and therefore there is no seed placed in the right axilla. Electronically Signed   By: Abelardo Diesel M.D.   On: 01/13/2020 14:15      IMPRESSION/PLAN: Right breast  cancer  It was a pleasure meeting the patient today. We discussed the risks, benefits, and side effects of radiotherapy. I recommend radiotherapy to the right breast and regional nodes to reduce her risk of locoregional recurrence by 2/3.  We discussed that radiation would take approximately 6 weeks to complete and that we will be able to simulate her next week before she goes on vacation the week thereafter.  We plan to start treatment around November 15 when she is back from vacation. We spoke about acute effects including skin irritation/peeling, breast tenderness, breast swelling, and fatigue as well as much less common late effects including internal organ injury (such as the lungs), rib fracture, or other injury to normal tissues. We spoke about the latest technology that is used to minimize the risk of late effects for patients  undergoing radiotherapy to the breast or chest wall. No guarantees of treatment were given. The patient is enthusiastic about proceeding with treatment.  Consent form has been signed and placed in her chart .I look forward to participating in the patient's care.    We discussed measures to reduce the risk of infection during the COVID-19 pandemic.  She has not yet been vaccinated.  I strongly recommended that she get vaccinated.  We talked about the pros and cons of the vaccine and the potential implications of becoming infected with Covid.  She reports that she does not yet feel ready to get the vaccine but she will think about it.  I encouraged her to let us know if she would like to receive the vaccine here.  On date of service, in total, I spent 50 minutes on this encounter. Patient was seen in person.   __________________________________________   Eppie Gibson, MD  This document serves as a record of services personally performed by Eppie Gibson, MD. It was created on his behalf by Clerance Lav, a trained medical scribe. The creation of this record is based on the  scribe's personal observations and the provider's statements to them. This document has been checked and approved by the attending provider.

## 2020-01-26 NOTE — Progress Notes (Signed)
Patient Care Team: [name], DO as PCP - General (Family Medicine) Mauro Kaufmann, RN as Oncology Nurse Navigator Rockwell Germany, RN as Oncology Nurse Navigator Gwyndolyn Kaufman, RN as Registered Nurse Gwyndolyn Kaufman, RN as Registered Nurse  DIAGNOSIS:    ICD-10-CM   1. Malignant neoplasm of upper-outer quadrant of right breast in female, estrogen receptor positive ([place])  C50.411    Z17.0     SUMMARY OF ONCOLOGIC HISTORY: Oncology History  Malignant neoplasm of upper-outer quadrant of right breast in female, estrogen receptor positive ([place])  07/28/2019 Initial Diagnosis   Screening mammogram detected right breast mass 11:30 position subareolar 1.3 cm, indeterminate 5 mm mass: Biopsy fibroadenoma, right axillary lymph node present, biopsy of the lymph node and the mass revealed grade 2 IDC ER 10%, PR 0%, Ki-67 45%, HER-2 +3+ by [facility]   08/20/2019 Cancer Staging   Staging form: Breast, AJCC 8th Edition - Clinical stage from 08/20/2019: Stage IIA (cT2, cN1, cM0, G2, ER+, PR-, HER2+) - Signed by Eppie Gibson, MD on 01/26/2020   09/02/2019 - 12/18/2019 Chemotherapy   The patient had dexamethasone (DECADRON) 4 MG tablet, 4 mg (100 % of original dose 4 mg), Oral, Daily, 1 of 1 cycle, Start date: 08/20/2019, End date: 01/06/2020 Dose modification: 4 mg (original dose 4 mg, Cycle 0) palonosetron (ALOXI) injection 0.25 mg, 0.25 mg, Intravenous,  Once, 6 of 6 cycles Administration: 0.25 mg (09/02/2019), 0.25 mg (09/23/2019), 0.25 mg (11/25/2019), 0.25 mg (12/16/2019), 0.25 mg (10/14/2019), 0.25 mg (11/04/2019) pegfilgrastim-jmdb (FULPHILA) injection 6 mg, 6 mg, Subcutaneous,  Once, 6 of 6 cycles Administration: 6 mg (09/04/2019), 6 mg (09/25/2019), 6 mg (11/27/2019), 6 mg (12/18/2019), 6 mg (10/16/2019), 6 mg (11/06/2019) CARBOplatin (PARAPLATIN) 700 mg in sodium chloride 0.9 % 250 mL chemo infusion, 700 mg (100 % of original dose 700 mg), Intravenous,  Once, 6 of 6 cycles Dose modification: 700  mg (original dose 700 mg, Cycle 1), 700 mg (original dose 700 mg, Cycle 5), 600 mg (original dose 700 mg, Cycle 5, Reason: Other (see comments), Comment: dose reduce to 600 mg for CINV per [name]) Administration: 700 mg (09/02/2019), 700 mg (09/23/2019), 600 mg (11/25/2019), 600 mg (12/16/2019), 700 mg (10/14/2019), 600 mg (11/04/2019) DOCEtaxel (TAXOTERE) 150 mg in sodium chloride 0.9 % 250 mL chemo infusion, 75 mg/m2 = 150 mg, Intravenous,  Once, 6 of 6 cycles Dose modification: 50 mg/m2 (original dose 75 mg/m2, Cycle 5, Reason: Dose not tolerated), 50 mg/m2 (original dose 75 mg/m2, Cycle 4, Reason: Dose not tolerated) Administration: 150 mg (09/02/2019), 150 mg (09/23/2019), 100 mg (11/25/2019), 100 mg (12/16/2019), 150 mg (10/14/2019), 100 mg (11/04/2019) fosaprepitant (EMEND) 150 mg in sodium chloride 0.9 % 145 mL IVPB, 150 mg, Intravenous,  Once, 6 of 6 cycles Administration: 150 mg (09/02/2019), 150 mg (09/23/2019), 150 mg (11/25/2019), 150 mg (12/16/2019), 150 mg (10/14/2019), 150 mg (11/04/2019) pertuzumab (PERJETA) 420 mg in sodium chloride 0.9 % 250 mL chemo infusion, 420 mg (100 % of original dose 420 mg), Intravenous, Once, 5 of 5 cycles Dose modification: 420 mg (original dose 420 mg, Cycle 1, Reason: Provider Judgment) Administration: 420 mg (09/02/2019), 420 mg (09/23/2019), 420 mg (11/25/2019), 420 mg (10/14/2019), 420 mg (11/04/2019) trastuzumab-dkst (OGIVRI) 672 mg in sodium chloride 0.9 % 250 mL chemo infusion, 8 mg/kg = 672 mg, Intravenous,  Once, 6 of 6 cycles Administration: 672 mg (09/02/2019), 504 mg (09/23/2019), 504 mg (11/25/2019), 504 mg (12/16/2019), 504 mg (10/14/2019), 504 mg (11/04/2019)  for chemotherapy  treatment.    01/06/2020 -  Chemotherapy   The patient had trastuzumab-dkst (OGIVRI) 504 mg in sodium chloride 0.9 % 250 mL chemo infusion, 483 mg (100 % of original dose 6 mg/kg), Intravenous,  Once, 1 of 6 cycles Dose modification: 6 mg/kg (original dose 6 mg/kg, Cycle 1, Reason: Provider  Judgment) Administration: 504 mg (01/06/2020)  for chemotherapy treatment.    01/14/2020 Surgery   Right lumpectomy Donne Hazel): no evidence of residual carcinoma, 4 right axillary lymph nodes negative for carcinoma.     CHIEF COMPLIANT: Follow-up s/p right lumpectomy  INTERVAL HISTORY: Stephanie Brewer is a 54 y.o. with above-mentioned history of right breast cancer who completed neoadjuvant chemotherapy. She underwent a right lumpectomy on 01/14/20 with Dr. Donne Hazel for which pathology showed no evidence of residual carcinoma, 4 right axillary lymph nodes negative for carcinoma. She presents to the clinic today to review the pathology report and discuss further treatment.    ALLERGIES:  is allergic to hyoscyamine, doxycycline, and nexium [esomeprazole].  MEDICATIONS:  Current Outpatient Medications  Medication Sig Dispense Refill  . ALLERGY CHILDRENS 12.5 MG/5ML liquid SWISH & SPIT 1WTEASPOONFUL 4 TIMES A DAYTAS NEEDED FOR MOUTH PAIN (Patient not taking: Reported on 01/25/2020)    . diphenoxylate-atropine (LOMOTIL) 2.5-0.025 MG tablet 1 to 2 four times daily as needed for diarrhea (Patient not taking: Reported on 01/26/2020) 60 tablet 3  . furosemide (LASIX) 20 MG tablet Take 1 tablet (20 mg total) by mouth 2 (two) times daily. (Patient not taking: Reported on 01/26/2020) 60 tablet 0  . lidocaine (XYLOCAINE) 2 % solution SWISH & SPIT 1WTEASPOONFUL 4 TIMES A DAYTAS NEEDED FOR MOUTH PAIN (Patient not taking: Reported on 01/25/2020)    . lidocaine (XYLOCAINE) 2 % solution Use as directed 5 mLs in the mouth or throat as needed for mouth pain. (Patient not taking: Reported on 01/26/2020) 200 mL 1  . magic mouthwash w/lidocaine SOLN Take 5 mLs by mouth 4 (four) times daily as needed for mouth pain. (Patient not taking: Reported on 01/26/2020) 120 mL 0  . Minocycline HCl 90 MG TB24 Take 1 tablet by mouth daily. Only takes prn for break outs (Patient not taking: Reported on 01/26/2020)    . nystatin  (MYCOSTATIN) 100000 UNIT/ML suspension SMARTSIG:1 Teaspoon By Mouth 4 Times Daily PRN (Patient not taking: Reported on 01/25/2020)    . potassium chloride SA (KLOR-CON) 20 MEQ tablet Take 1 tablet (20 mEq total) by mouth 2 (two) times daily. (Patient not taking: Reported on 01/25/2020) 60 tablet 0  . rivaroxaban (XARELTO) 20 MG TABS tablet Take 1 tablet (20 mg total) by mouth daily with supper. x5 months. Then stop. 30 tablet 4   No current facility-administered medications for this visit.   Facility-Administered Medications Ordered in Other Visits  Medication Dose Route Frequency Provider Last Rate Last Admin  . sodium chloride flush (NS) 0.9 % injection 10 mL  10 mL Intravenous PRN Nicholas Lose, MD   10 mL at 09/02/19 0839    PHYSICAL EXAMINATION: ECOG PERFORMANCE STATUS: 1 - Symptomatic but completely ambulatory  Vitals:   01/27/20 1509  BP: 127/86  Pulse: 72  Resp: 18  Temp: (!) 97.5 F (36.4 C)  SpO2: 100%   Filed Weights   01/27/20 1509  Weight: 171 lb 11.2 oz (77.9 kg)    LABORATORY DATA:  I have reviewed the data as listed CMP Latest Ref Rng & Units 01/27/2020 01/11/2020 01/06/2020  Glucose 70 - 99 mg/dL 126(H) 100(H) 93  BUN  6 - 20 mg/dL 10 5(L) 7  Creatinine 0.44 - 1.00 mg/dL 0.68 0.61 0.59  Sodium 135 - 145 mmol/L 140 141 141  Potassium 3.5 - 5.1 mmol/L 3.6 3.8 3.1(L)  Chloride 98 - 111 mmol/L 107 106 105  CO2 22 - 32 mmol/L 27 26 32  Calcium 8.9 - 10.3 mg/dL 9.1 8.9 8.4(L)  Total Protein 6.5 - 8.1 g/dL 6.4(L) - 5.5(L)  Total Bilirubin 0.3 - 1.2 mg/dL 0.3 - 0.3  Alkaline Phos 38 - 126 U/L 102 - 83  AST 15 - 41 U/L 23 - 22  ALT 0 - 44 U/L 22 - 18    Lab Results  Component Value Date   WBC 4.5 01/27/2020   HGB 10.4 (L) 01/27/2020   HCT 32.2 (L) 01/27/2020   MCV 95.3 01/27/2020   PLT 285 01/27/2020   NEUTROABS 1.9 01/27/2020    ASSESSMENT & PLAN:  Malignant neoplasm of upper-outer quadrant of right breast in female, estrogen receptor positive  ([facility]) 07/28/2019:Screening mammogram detected right breast mass 11:30 position subareolar 1.3 cm, indeterminate 5 mm mass: Biopsy fibroadenoma, right axillary lymph node present, biopsy of the lymph node and the mass revealed grade 2 IDC ER 10%, PR 0%, Ki-67 45%, HER-2 +3+ by IHC T1 cN1 M0 stage IIa  Treatment plan: 1. Neoadjuvant chemotherapy with TCH Perjeta 6 cycles completed 12/16/19 (Perjeta D/Ced due to diarrhea) followed by Herceptin versus Kadcylamaintenance for 1 year 2. Followed by breast conserving surgery with sentinel lymph node study 3. Followed by adjuvant radiation therapy if patient had lumpectomy  Breast MRI: 3.8 cm tumor and 1 axillary lymphnode. ---------------------------------------------------------------------------------------------------------------------------------------------------- Breast MRI 12/21/19: Complete imaging response 01/14/2020: Right lumpectomy: Benign, no evidence of residual cancer, 0/4 lymph nodes negative, additional margins also negative, complete pathologic response  Pathology review: I discussed the final pathology report and provided her with a copy of this report.  This offers her best overall prognosis.  Treatment plan: Continue with Herceptin maintenance  every 3 weeks. Labs and MD visit every 6 weeks.  She is and planning for adjuvant radiation with Dr. Squire.  I instructed her to get the COVID-19 vaccine.  She wants to do it after her vacation in the first week of November.   No orders of the defined types were placed in this encounter.  The patient has a good understanding of the overall plan. she agrees with it. she will call with any problems that may develop before the next visit here.  Total time spent: 30 mins including face to face time and time spent for planning, charting and coordination of care  Gudena, Vinay, MD 01/27/2020  I, Molly Dorshimer, am acting as scribe for Dr. Vinay Gudena.  I have reviewed the  above documentation for accuracy and completeness, and I agree with the above.       

## 2020-01-26 NOTE — Assessment & Plan Note (Signed)
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 Neoadjuvant chemotherapy with TCH Perjeta 6 cycles completed 12/16/19 (Perjeta D/Ced due to diarrhea) followed by Herceptin versus Kadcylamaintenance for 1 year 2. Followed by breast conserving surgery with sentinel lymph node study 3. Followed by adjuvant radiation therapy if patient had lumpectomy  Breast MRI: 3.8 cm tumor and 1 axillary lymphnode. ---------------------------------------------------------------------------------------------------------------------------------------------------- Breast MRI 12/21/19: Complete imaging response 01/14/2020: Right lumpectomy: Benign, no evidence of residual cancer, 0/4 lymph nodes negative, additional margins also negative, complete pathologic response  Pathology review: I discussed the final pathology report and provided her with a copy of this report.  This offers her best overall prognosis.  Treatment plan: Continue with Herceptin maintenance  every 3 weeks. Labs and MD visit every 6 weeks.

## 2020-01-27 ENCOUNTER — Other Ambulatory Visit: Payer: Self-pay

## 2020-01-27 ENCOUNTER — Ambulatory Visit: Payer: BC Managed Care – PPO | Attending: Radiation Oncology

## 2020-01-27 ENCOUNTER — Inpatient Hospital Stay (HOSPITAL_BASED_OUTPATIENT_CLINIC_OR_DEPARTMENT_OTHER): Payer: BC Managed Care – PPO | Admitting: Hematology and Oncology

## 2020-01-27 ENCOUNTER — Telehealth: Payer: Self-pay | Admitting: *Deleted

## 2020-01-27 ENCOUNTER — Telehealth: Payer: Self-pay

## 2020-01-27 ENCOUNTER — Inpatient Hospital Stay: Payer: BC Managed Care – PPO

## 2020-01-27 ENCOUNTER — Encounter: Payer: Self-pay | Admitting: *Deleted

## 2020-01-27 ENCOUNTER — Inpatient Hospital Stay: Payer: BC Managed Care – PPO | Admitting: *Deleted

## 2020-01-27 DIAGNOSIS — Z95828 Presence of other vascular implants and grafts: Secondary | ICD-10-CM

## 2020-01-27 DIAGNOSIS — Z5112 Encounter for antineoplastic immunotherapy: Secondary | ICD-10-CM | POA: Diagnosis not present

## 2020-01-27 DIAGNOSIS — C50411 Malignant neoplasm of upper-outer quadrant of right female breast: Secondary | ICD-10-CM

## 2020-01-27 DIAGNOSIS — Z170 Estrogen receptor positive status [ER+]: Secondary | ICD-10-CM | POA: Diagnosis not present

## 2020-01-27 DIAGNOSIS — C773 Secondary and unspecified malignant neoplasm of axilla and upper limb lymph nodes: Secondary | ICD-10-CM | POA: Diagnosis not present

## 2020-01-27 LAB — CMP (CANCER CENTER ONLY)
ALT: 22 U/L (ref 0–44)
AST: 23 U/L (ref 15–41)
Albumin: 3.5 g/dL (ref 3.5–5.0)
Alkaline Phosphatase: 102 U/L (ref 38–126)
Anion gap: 6 (ref 5–15)
BUN: 10 mg/dL (ref 6–20)
CO2: 27 mmol/L (ref 22–32)
Calcium: 9.1 mg/dL (ref 8.9–10.3)
Chloride: 107 mmol/L (ref 98–111)
Creatinine: 0.68 mg/dL (ref 0.44–1.00)
GFR, Estimated: >60 mL/min (ref >60)
Glucose, Bld: 126 mg/dL — ABNORMAL HIGH (ref 70–99)
Potassium: 3.6 mmol/L (ref 3.5–5.1)
Sodium: 140 mmol/L (ref 135–145)
Total Bilirubin: 0.3 mg/dL (ref 0.3–1.2)
Total Protein: 6.4 g/dL — ABNORMAL LOW (ref 6.5–8.1)

## 2020-01-27 LAB — CBC WITH DIFFERENTIAL (CANCER CENTER ONLY)
Abs Immature Granulocytes: 0.01 10*3/uL (ref 0.00–0.07)
Basophils Absolute: 0.1 10*3/uL (ref 0.0–0.1)
Basophils Relative: 2 %
Eosinophils Absolute: 0.4 10*3/uL (ref 0.0–0.5)
Eosinophils Relative: 9 %
HCT: 32.2 % — ABNORMAL LOW (ref 36.0–46.0)
Hemoglobin: 10.4 g/dL — ABNORMAL LOW (ref 12.0–15.0)
Immature Granulocytes: 0 %
Lymphocytes Relative: 35 %
Lymphs Abs: 1.6 10*3/uL (ref 0.7–4.0)
MCH: 30.8 pg (ref 26.0–34.0)
MCHC: 32.3 g/dL (ref 30.0–36.0)
MCV: 95.3 fL (ref 80.0–100.0)
Monocytes Absolute: 0.5 10*3/uL (ref 0.1–1.0)
Monocytes Relative: 11 %
Neutro Abs: 1.9 10*3/uL (ref 1.7–7.7)
Neutrophils Relative %: 43 %
Platelet Count: 285 10*3/uL (ref 150–400)
RBC: 3.38 MIL/uL — ABNORMAL LOW (ref 3.87–5.11)
RDW: 13.7 % (ref 11.5–15.5)
WBC Count: 4.5 10*3/uL (ref 4.0–10.5)
nRBC: 0 % (ref 0.0–0.2)

## 2020-01-27 LAB — PREGNANCY, URINE: Preg Test, Ur: NEGATIVE

## 2020-01-27 MED ORDER — DIPHENHYDRAMINE HCL 25 MG PO CAPS
25.0000 mg | ORAL_CAPSULE | Freq: Once | ORAL | Status: AC
  Administered 2020-01-27: 25 mg via ORAL

## 2020-01-27 MED ORDER — HEPARIN SOD (PORK) LOCK FLUSH 100 UNIT/ML IV SOLN
500.0000 [IU] | Freq: Once | INTRAVENOUS | Status: AC | PRN
  Administered 2020-01-27: 500 [IU]
  Filled 2020-01-27: qty 5

## 2020-01-27 MED ORDER — SODIUM CHLORIDE 0.9 % IV SOLN
Freq: Once | INTRAVENOUS | Status: AC
  Filled 2020-01-27: qty 250

## 2020-01-27 MED ORDER — TRASTUZUMAB-DKST CHEMO 150 MG IV SOLR
450.0000 mg | Freq: Once | INTRAVENOUS | Status: AC
  Administered 2020-01-27: 450 mg via INTRAVENOUS
  Filled 2020-01-27: qty 21.43

## 2020-01-27 MED ORDER — SODIUM CHLORIDE 0.9% FLUSH
10.0000 mL | Freq: Once | INTRAVENOUS | Status: AC
  Administered 2020-01-27: 10 mL
  Filled 2020-01-27: qty 10

## 2020-01-27 MED ORDER — ACETAMINOPHEN 325 MG PO TABS
ORAL_TABLET | ORAL | Status: AC
  Filled 2020-01-27: qty 2

## 2020-01-27 MED ORDER — SODIUM CHLORIDE 0.9% FLUSH
10.0000 mL | INTRAVENOUS | Status: DC | PRN
  Administered 2020-01-27: 10 mL
  Filled 2020-01-27: qty 10

## 2020-01-27 MED ORDER — ACETAMINOPHEN 325 MG PO TABS
650.0000 mg | ORAL_TABLET | Freq: Once | ORAL | Status: AC
  Administered 2020-01-27: 650 mg via ORAL

## 2020-01-27 NOTE — Patient Instructions (Signed)

## 2020-01-27 NOTE — Telephone Encounter (Signed)
error 

## 2020-01-27 NOTE — Research (Signed)
Stephanie Brewer - STUDYING HOW CANCER TREATMENT AFFECTS THE NERVES IN YOUR HANDS AND FEET  01/27/2020 3:41 PM  Stephanie Brewer arrives this afternoon for her follow up appointment with [name] and to complete her 24-week S1714 visit; she is unaccompanied today.  She reports very mild "tingling" in her fingertips and toes.    PROs: [name] completes the 24-week PROs: they are checked for completeness and there are no unanswered questions.  The pt verbalized that she made a couple of errors while completing her questionnaires.  She said that she marked out the incorrect responses and initialed them.    LABS: There are no research labs scheduled for today since Renne did not consent to optional lab specimens.  NEUROPEN/TUNING FORK: The neuropen and tuning fork assessments are completed without difficulty. All lower extremity and upper extremity vibration sites are tested on her dominant side (left) per protocol with the assistance of clinical research coordinator Clabe Seal as a scribe and Armed forces operational officer.  MEDICATION REVIEW:  The pt said the only medication that she takes on a regular basis is her Xarelto.    SUPPLEMENTS/TOPICAL AGENTS/ALTERNATIVE MEDICINES: [name] denies taking any vitamins or supplements; she is currently not using any topical agents. She specifically denies any alternative or complementary medicines/treatments.  TIMED GET UP AND GO TEST: Completed per protocol by this certified research RN, and recorded by Clabe Seal, Clinical Research Coordinator I.    HISTORY OF FALLS: Patient confirmed "no" to history of falls in the past 6 months.      PROVIDER VISIT: The pt was seen and examined by [name] this afternoon.  He completed the Solicited Neuropathy Events form and the Follow-Up Physician Assessment.    TREATMENT:  The pt's pre-operative chemotherapy has been completed.  The pt has had surgery, and she is scheduled to receive radiation therapy in the near future.  Per Dr. Geralyn Flash note, the pt  will continue with Herceptin maintenance every 3 weeks.    PLAN:  Upon completion of the S1714 assessments, [name] is thanked for her time and continued participation in the S1714 study. The current plan is to see [name] for her 52-week visit in June 2022. [name] is encouraged to call for any needs or questions: she verbalizes understanding.   Brion Aliment RN, BSN, CCRP Clinical Research Nurse 01/27/2020 4:00 PM

## 2020-01-27 NOTE — Patient Instructions (Signed)
[facility] Discharge Instructions for Patients Receiving Chemotherapy  Today you received the following chemotherapy agents trastuzumab.  To help prevent nausea and vomiting after your treatment, we encourage you to take your nausea medication as directed.    If you develop nausea and vomiting that is not controlled by your nausea medication, call the clinic.   BELOW ARE SYMPTOMS THAT SHOULD BE REPORTED IMMEDIATELY:  *FEVER GREATER THAN 100.5 F  *CHILLS WITH OR WITHOUT FEVER  NAUSEA AND VOMITING THAT IS NOT CONTROLLED WITH YOUR NAUSEA MEDICATION  *UNUSUAL SHORTNESS OF BREATH  *UNUSUAL BRUISING OR BLEEDING  TENDERNESS IN MOUTH AND THROAT WITH OR WITHOUT PRESENCE OF ULCERS  *URINARY PROBLEMS  *BOWEL PROBLEMS  UNUSUAL RASH Items with * indicate a potential emergency and should be followed up as soon as possible.  Feel free to call the clinic should you have any questions or concerns. The clinic phone number is [phone].  Please show the CHEMO ALERT CARD at check-in to the Emergency Department and triage nurse.   

## 2020-01-27 NOTE — Telephone Encounter (Signed)
Called patient to inform of lab for today @ 2:15 pm, spoke with patient and she is aware of this lab

## 2020-02-01 ENCOUNTER — Telehealth: Payer: Self-pay | Admitting: Hematology and Oncology

## 2020-02-01 NOTE — Telephone Encounter (Signed)
Scheduled per 10/27 los. Pt will receive an updated appt calendar per next appt notes

## 2020-02-02 ENCOUNTER — Other Ambulatory Visit: Payer: Self-pay

## 2020-02-02 ENCOUNTER — Ambulatory Visit
Admission: RE | Admit: 2020-02-02 | Discharge: 2020-02-02 | Disposition: A | Discharge status: Home / Self Care | Payer: BC Managed Care – PPO | Source: Ambulatory Visit | Attending: Radiation Oncology | Admitting: Radiation Oncology

## 2020-02-02 DIAGNOSIS — C50411 Malignant neoplasm of upper-outer quadrant of right female breast: Secondary | ICD-10-CM | POA: Diagnosis not present

## 2020-02-02 DIAGNOSIS — Z510 Encounter for antineoplastic radiation therapy: Secondary | ICD-10-CM | POA: Diagnosis not present

## 2020-02-02 DIAGNOSIS — Z170 Estrogen receptor positive status [ER+]: Secondary | ICD-10-CM | POA: Diagnosis not present

## 2020-02-02 DIAGNOSIS — C773 Secondary and unspecified malignant neoplasm of axilla and upper limb lymph nodes: Secondary | ICD-10-CM | POA: Diagnosis not present

## 2020-02-03 ENCOUNTER — Ambulatory Visit: Payer: BC Managed Care – PPO | Attending: General Surgery | Admitting: Physical Therapy

## 2020-02-03 ENCOUNTER — Encounter: Payer: Self-pay | Admitting: Physical Therapy

## 2020-02-03 DIAGNOSIS — Z170 Estrogen receptor positive status [ER+]: Secondary | ICD-10-CM | POA: Diagnosis not present

## 2020-02-03 DIAGNOSIS — R293 Abnormal posture: Secondary | ICD-10-CM | POA: Diagnosis not present

## 2020-02-03 DIAGNOSIS — C50411 Malignant neoplasm of upper-outer quadrant of right female breast: Secondary | ICD-10-CM

## 2020-02-03 NOTE — Therapy (Signed)
[place], [place], [address] Phone: [phone]   Fax:  [phone]  Physical Therapy Treatment  Patient Details  Name: Stephanie Brewer MRN: [id number] Date of Birth: Nov 02, 1965 Referring Provider (PT): Lindi Adie   Encounter Date: 02/03/2020   PT End of Session - 02/03/20 1448    Visit Number 2    Number of Visits 2    Date for PT Re-Evaluation 01/14/20    PT Start Time 9381    PT Stop Time 1445    PT Time Calculation (min) 40 min    Activity Tolerance Patient tolerated treatment well    Behavior During Therapy [facility] for tasks assessed/performed           Past Medical History:  Diagnosis Date  . Cancer ([place])   . DVT (deep venous thrombosis) ([facility])    left leg knee and ankle     Past Surgical History:  Procedure Laterality Date  . BREAST IMPLANT REMOVAL Bilateral 01/14/2020   Procedure: REMOVAL BREAST IMPLANTS;  Surgeon: Cindra Presume, MD;  Location: [facility];  Service: Plastics;  Laterality: Bilateral;  . BREAST LUMPECTOMY WITH RADIOACTIVE SEED AND SENTINEL LYMPH NODE BIOPSY Right 01/14/2020   Procedure: Right breast seed localized lumpectomy with sentinel lymph node biopsy;  Surgeon: Rolm Bookbinder, MD;  Location: [facility];  Service: General;  Laterality: Right;  . BREAST SURGERY     Augmentation 2003  . HERNIA REPAIR  2002  . PORTACATH PLACEMENT N/A 09/01/2019   Procedure: INSERTION PORT-A-CATH WITH ULTRASOUND GUIDANCE;  Surgeon: Rolm Bookbinder, MD;  Location: [facility];  Service: General;  Laterality: N/A;    There were no vitals filed for this visit.   Subjective Assessment - 02/03/20 1408    Subjective I am doing great after my surgery. I am not having any trouble.    Pertinent History R breast cancer, grade 2 invasive ductal carcinoma, ER +, PR-, HER2+, beginning chemo next week 09/02/19, 01/15/20- R lumpectomy and SLNB (4 nodes all  negative), will begin radiation 02/15/20,  hx of LLE DVT following foot surgery for bunion    Patient Stated Goals reduce lymphedema risk and learn post op shoulder ROM HEP    Currently in Pain? Yes    Pain Score 1     Pain Location Axilla    Pain Orientation Right    Pain Descriptors / Indicators Tender    Pain Type Surgical pain    Pain Onset 1 to 4 weeks ago    Pain Frequency Intermittent    Aggravating Factors  holding arm up for extended period    Pain Relieving Factors nothing    Effect of Pain on Daily Activities none              [facility] PT Assessment - 02/03/20 0001      AROM   Right Shoulder Extension 61 Degrees    Right Shoulder Flexion 173 Degrees    Right Shoulder ABduction 173 Degrees    Right Shoulder Internal Rotation 68 Degrees    Right Shoulder External Rotation 90 Degrees             LYMPHEDEMA/ONCOLOGY QUESTIONNAIRE - 02/03/20 0001      Right Upper Extremity Lymphedema   15 cm Proximal to Olecranon Process 34.5 cm    Olecranon Process 27 cm    15 cm Proximal to Ulnar Styloid Process 25 cm    Just Proximal to Ulnar Styloid  Process 17.1 cm    Across Hand at [employer] 19 cm    At [place] of 2nd Digit 5.9 cm                              PT Education - 02/03/20 1452    Education Details lymphedema risk reduction practices, scar mobilization    Person(s) Educated Patient    Methods Explanation;Handout    Comprehension Verbalized understanding               PT Long Term Goals - 02/03/20 1448      PT LONG TERM GOAL #1   Title Pt will demonstrate she has regained full shoulder ROM and function post operatively compared to baselines.    Time 8    Period Weeks    Status Achieved                 Plan - 02/03/20 1449    Clinical Impression Statement Pt returns to PT for post op follow up. She has returned to baseline shoulder ROM and has even exceeded baseline shoulder ROM. Pt reports she has not been having any  difficulty with ROM or swelling since surgery. Her scars are healing well. Encouraged pt to perform scar mobilization to scars to allow improved skin mobility. Also educated pt in lymphedema risk reduction practices. Circumferential measuerments taken today demonstrate no increase in swelling throughout RUE. Pt will be followed every 3 months for SOZO screenings to detect subclinical lymphedema. She will be discharged from skilled PT services at this time.    PT Duration --   eval and 1 f/u post op   PT Treatment/Interventions ADLs/Self Care Home Management;Therapeutic exercise;Patient/family education    PT Next Visit Plan d/c this visit    PT Home Exercise Plan post op breast ex    Consulted and Agree with Plan of Care Patient           Patient will benefit from skilled therapeutic intervention in order to improve the following deficits and impairments:  Postural dysfunction, Decreased knowledge of precautions  Visit Diagnosis: Abnormal posture  Malignant neoplasm of upper-outer quadrant of right breast in female, estrogen receptor positive ([place])     Problem List Patient Active Problem List   Diagnosis Date Noted  . Port-A-Cath in place 09/09/2019  . Malignant neoplasm of upper-outer quadrant of right breast in female, estrogen receptor positive ([place]) 08/20/2019  . Radicular leg pain 02/04/2018  . Numbness and tingling 02/04/2018  . Foot pain, left 02/04/2018  . Displacement of breast implant 12/24/2017  . H/O bilateral breast implants 12/24/2017  . GERD (gastroesophageal reflux disease) 12/06/2015  . Gluten intolerance 12/06/2015    Allyson Sabal [facility] 02/03/2020, 2:53 PM  [place], [place], [address] Phone: [phone]   Fax:  [phone]  Name: Stephanie Brewer MRN: [id number] Date of Birth: [birth year]  PHYSICAL THERAPY DISCHARGE SUMMARY  Visits from Start of Care: 2  Current  functional level related to goals / functional outcomes: All goals met   Remaining deficits: None   Education / Equipment: HEP, lymphedema risk reduction practices  Plan: Patient agrees to discharge.  Patient goals were met. Patient is being discharged due to meeting the stated rehab goals.  ?????    Allyson Sabal [place], [place] 02/03/20 2:55 PM

## 2020-02-04 ENCOUNTER — Other Ambulatory Visit: Payer: Self-pay

## 2020-02-04 ENCOUNTER — Encounter: Payer: Self-pay | Admitting: Plastic Surgery

## 2020-02-04 ENCOUNTER — Ambulatory Visit (INDEPENDENT_AMBULATORY_CARE_PROVIDER_SITE_OTHER): Payer: BC Managed Care – PPO | Admitting: Plastic Surgery

## 2020-02-04 VITALS — BP 108/70 | HR 85 | Temp 97.8°F

## 2020-02-04 DIAGNOSIS — B372 Candidiasis of skin and nail: Secondary | ICD-10-CM

## 2020-02-04 MED ORDER — KETOCONAZOLE 2 % EX CREA: 1.0000 "application " | TOPICAL_CREAM | Freq: Every day | CUTANEOUS | 0 refills | Status: DC

## 2020-02-04 NOTE — Progress Notes (Signed)
Patient is a 54 year old female who underwent removal of bilateral breast implants by Dr. Claudia Desanctis followed by right breast lumpectomy with radioactive seed and sentinel lymph node biopsy and right axillary lymph node reactive CT-guided excision by Dr. Donne Hazel on 01/14/2020.   ~ 3 week PO She presents today with her husband due to concerns about drainage and an odor. They report there was an odor from the left breast that smelled like yeast for the last few days. They removed the Steri-stips and the odor was stronger and a small amount of drainage was present under the left breast.  Bilateral incisions are healing very nicely, C/D/I.  No signs of infection.  There is a small area of erythema and rash medial to the incision under the left breast.  Suspect intertrigo.  Patient denies fever/chills, nausea/vomiting, pain.  Rx sent to pharmacy for ketoconazole.  Recommend keeping the area dry.  She may use gauze or other absorbent material under the left breast to help absorb any moisture.  Patient provided with return precautions.  Follow-up as needed.  Call office with any questions/concerns.

## 2020-02-08 ENCOUNTER — Encounter: Payer: Self-pay | Admitting: *Deleted

## 2020-02-08 DIAGNOSIS — Z170 Estrogen receptor positive status [ER+]: Secondary | ICD-10-CM | POA: Diagnosis not present

## 2020-02-08 DIAGNOSIS — C773 Secondary and unspecified malignant neoplasm of axilla and upper limb lymph nodes: Secondary | ICD-10-CM | POA: Diagnosis not present

## 2020-02-08 DIAGNOSIS — C50411 Malignant neoplasm of upper-outer quadrant of right female breast: Secondary | ICD-10-CM | POA: Diagnosis not present

## 2020-02-08 DIAGNOSIS — Z510 Encounter for antineoplastic radiation therapy: Secondary | ICD-10-CM | POA: Diagnosis not present

## 2020-02-15 ENCOUNTER — Other Ambulatory Visit: Payer: Self-pay

## 2020-02-15 ENCOUNTER — Ambulatory Visit
Admission: RE | Admit: 2020-02-15 | Discharge: 2020-02-15 | Disposition: A | Discharge status: Home / Self Care | Payer: BC Managed Care – PPO | Source: Ambulatory Visit | Attending: Radiation Oncology | Admitting: Radiation Oncology

## 2020-02-15 DIAGNOSIS — Z170 Estrogen receptor positive status [ER+]: Secondary | ICD-10-CM | POA: Diagnosis not present

## 2020-02-15 DIAGNOSIS — C773 Secondary and unspecified malignant neoplasm of axilla and upper limb lymph nodes: Secondary | ICD-10-CM | POA: Diagnosis not present

## 2020-02-15 DIAGNOSIS — C50411 Malignant neoplasm of upper-outer quadrant of right female breast: Secondary | ICD-10-CM | POA: Diagnosis not present

## 2020-02-15 DIAGNOSIS — Z510 Encounter for antineoplastic radiation therapy: Secondary | ICD-10-CM | POA: Diagnosis not present

## 2020-02-15 MED ORDER — SONAFINE EX EMUL
1.0000 "application " | Freq: Two times a day (BID) | CUTANEOUS | Status: DC
  Administered 2020-02-15: 1 via TOPICAL

## 2020-02-15 MED ORDER — ALRA NON-METALLIC DEODORANT (RAD-ONC)
1.0000 "application " | Freq: Once | TOPICAL | Status: AC
  Administered 2020-02-15: 1 via TOPICAL

## 2020-02-15 NOTE — Progress Notes (Signed)
Pt here for patient teaching.    Pt given Radiation and You booklet, skin care instructions, Alra deodorant and Sonafine.    Reviewed areas of pertinence such as fatigue, hair loss, skin changes, breast tenderness and breast swelling .   Pt able to give teach back of to pat skin, use unscented/gentle soap and drink plenty of water,apply Sonafine bid, avoid applying anything to skin within 4 hours of treatment, avoid wearing an under wire bra and to use an electric razor if they must shave.   Pt demonstrated understanding and verbalizes understanding of information given and will contact nursing with any questions or concerns.    [email or web address]          

## 2020-02-16 ENCOUNTER — Ambulatory Visit
Admission: RE | Admit: 2020-02-16 | Discharge: 2020-02-16 | Disposition: A | Discharge status: Home / Self Care | Payer: BC Managed Care – PPO | Source: Ambulatory Visit | Attending: Radiation Oncology | Admitting: Radiation Oncology

## 2020-02-16 DIAGNOSIS — C50411 Malignant neoplasm of upper-outer quadrant of right female breast: Secondary | ICD-10-CM | POA: Diagnosis not present

## 2020-02-16 DIAGNOSIS — Z510 Encounter for antineoplastic radiation therapy: Secondary | ICD-10-CM | POA: Diagnosis not present

## 2020-02-16 DIAGNOSIS — Z170 Estrogen receptor positive status [ER+]: Secondary | ICD-10-CM | POA: Diagnosis not present

## 2020-02-16 DIAGNOSIS — C773 Secondary and unspecified malignant neoplasm of axilla and upper limb lymph nodes: Secondary | ICD-10-CM | POA: Diagnosis not present

## 2020-02-17 ENCOUNTER — Inpatient Hospital Stay: Payer: BC Managed Care – PPO | Attending: Hematology and Oncology

## 2020-02-17 ENCOUNTER — Other Ambulatory Visit: Payer: Self-pay

## 2020-02-17 ENCOUNTER — Inpatient Hospital Stay: Payer: BC Managed Care – PPO

## 2020-02-17 ENCOUNTER — Inpatient Hospital Stay (HOSPITAL_BASED_OUTPATIENT_CLINIC_OR_DEPARTMENT_OTHER): Payer: BC Managed Care – PPO | Admitting: Medical

## 2020-02-17 ENCOUNTER — Other Ambulatory Visit: Payer: Self-pay | Admitting: Hematology and Oncology

## 2020-02-17 ENCOUNTER — Other Ambulatory Visit: Payer: Self-pay | Admitting: Medical

## 2020-02-17 ENCOUNTER — Ambulatory Visit
Admission: RE | Admit: 2020-02-17 | Discharge: 2020-02-17 | Disposition: A | Discharge status: Home / Self Care | Payer: BC Managed Care – PPO | Source: Ambulatory Visit | Attending: Radiation Oncology | Admitting: Radiation Oncology

## 2020-02-17 VITALS — BP 151/90 | HR 87 | Temp 98.1°F | Resp 16 | Wt 171.0 lb

## 2020-02-17 DIAGNOSIS — C50411 Malignant neoplasm of upper-outer quadrant of right female breast: Secondary | ICD-10-CM | POA: Insufficient documentation

## 2020-02-17 DIAGNOSIS — Z5112 Encounter for antineoplastic immunotherapy: Secondary | ICD-10-CM | POA: Diagnosis not present

## 2020-02-17 DIAGNOSIS — Z170 Estrogen receptor positive status [ER+]: Secondary | ICD-10-CM

## 2020-02-17 DIAGNOSIS — C773 Secondary and unspecified malignant neoplasm of axilla and upper limb lymph nodes: Secondary | ICD-10-CM | POA: Insufficient documentation

## 2020-02-17 DIAGNOSIS — Z510 Encounter for antineoplastic radiation therapy: Secondary | ICD-10-CM | POA: Diagnosis not present

## 2020-02-17 MED ORDER — TRIAMCINOLONE ACETONIDE 0.1 % EX LOTN: 1.0000 "application " | TOPICAL_LOTION | Freq: Three times a day (TID) | CUTANEOUS | 2 refills | Status: DC

## 2020-02-17 MED ORDER — DIPHENHYDRAMINE HCL 25 MG PO CAPS
ORAL_CAPSULE | ORAL | Status: AC
  Filled 2020-02-17: qty 1

## 2020-02-17 MED ORDER — SODIUM CHLORIDE 0.9% FLUSH
10.0000 mL | INTRAVENOUS | Status: DC | PRN
  Administered 2020-02-17: 10 mL
  Filled 2020-02-17: qty 10

## 2020-02-17 MED ORDER — SODIUM CHLORIDE 0.9 % IV SOLN
Freq: Once | INTRAVENOUS | Status: AC
  Filled 2020-02-17: qty 250

## 2020-02-17 MED ORDER — ACETAMINOPHEN 325 MG PO TABS
ORAL_TABLET | ORAL | Status: AC
  Filled 2020-02-17: qty 2

## 2020-02-17 MED ORDER — ACETAMINOPHEN 325 MG PO TABS
650.0000 mg | ORAL_TABLET | Freq: Once | ORAL | Status: AC
  Administered 2020-02-17: 650 mg via ORAL

## 2020-02-17 MED ORDER — HEPARIN SOD (PORK) LOCK FLUSH 100 UNIT/ML IV SOLN
500.0000 [IU] | Freq: Once | INTRAVENOUS | Status: AC | PRN
  Administered 2020-02-17: 500 [IU]
  Filled 2020-02-17: qty 5

## 2020-02-17 MED ORDER — TRASTUZUMAB-DKST CHEMO 150 MG IV SOLR
450.0000 mg | Freq: Once | INTRAVENOUS | Status: AC
  Administered 2020-02-17: 450 mg via INTRAVENOUS
  Filled 2020-02-17: qty 21.43

## 2020-02-17 MED ORDER — DIPHENHYDRAMINE HCL 25 MG PO CAPS
25.0000 mg | ORAL_CAPSULE | Freq: Once | ORAL | Status: AC
  Administered 2020-02-17: 25 mg via ORAL

## 2020-02-17 NOTE — Patient Instructions (Signed)
[facility] Discharge Instructions for Patients Receiving Chemotherapy  Today you received the following chemotherapy agents trastuzumab.  To help prevent nausea and vomiting after your treatment, we encourage you to take your nausea medication as directed.    If you develop nausea and vomiting that is not controlled by your nausea medication, call the clinic.   BELOW ARE SYMPTOMS THAT SHOULD BE REPORTED IMMEDIATELY:  *FEVER GREATER THAN 100.5 F  *CHILLS WITH OR WITHOUT FEVER  NAUSEA AND VOMITING THAT IS NOT CONTROLLED WITH YOUR NAUSEA MEDICATION  *UNUSUAL SHORTNESS OF BREATH  *UNUSUAL BRUISING OR BLEEDING  TENDERNESS IN MOUTH AND THROAT WITH OR WITHOUT PRESENCE OF ULCERS  *URINARY PROBLEMS  *BOWEL PROBLEMS  UNUSUAL RASH Items with * indicate a potential emergency and should be followed up as soon as possible.  Feel free to call the clinic should you have any questions or concerns. The clinic phone number is [phone].  Please show the CHEMO ALERT CARD at check-in to the Emergency Department and triage nurse.   

## 2020-02-17 NOTE — Progress Notes (Signed)
Nutrition Follow-up:  Patient with right breast cancer.  Patient has completed neoadjuvant treatment.  S/p right lumpectomy on 10/14.  Patient has started radiation and herceptin.   Spoke with patient during infusion.  Patient reports that she is doing well. Appetite is better. Still down a few days after treatment.  Does not really want chicken but consuming other protein rich foods.      Medications: reviewed  Labs: reviewed  Anthropometrics:   Weight 171 lb today decreased from 178 on 10/6   NUTRITION DIAGNOSIS: Inadequate oral intake improving   INTERVENTION:  Patient to continue to consume well balanced meals including good sources of protein.  Patient has contact information and will contact RD if nutrition status changes.    NEXT VISIT: no follow-up Patient to contact as needed  Breeann Reposa B. Zenia Resides, [place], [place] Registered Dietitian [phone] (mobile)

## 2020-02-18 ENCOUNTER — Ambulatory Visit
Admission: RE | Admit: 2020-02-18 | Discharge: 2020-02-18 | Disposition: A | Discharge status: Home / Self Care | Payer: BC Managed Care – PPO | Source: Ambulatory Visit | Attending: Radiation Oncology | Admitting: Radiation Oncology

## 2020-02-18 DIAGNOSIS — Z510 Encounter for antineoplastic radiation therapy: Secondary | ICD-10-CM | POA: Diagnosis not present

## 2020-02-18 DIAGNOSIS — Z170 Estrogen receptor positive status [ER+]: Secondary | ICD-10-CM | POA: Diagnosis not present

## 2020-02-18 DIAGNOSIS — C50411 Malignant neoplasm of upper-outer quadrant of right female breast: Secondary | ICD-10-CM | POA: Diagnosis not present

## 2020-02-18 DIAGNOSIS — C773 Secondary and unspecified malignant neoplasm of axilla and upper limb lymph nodes: Secondary | ICD-10-CM | POA: Diagnosis not present

## 2020-02-19 ENCOUNTER — Ambulatory Visit
Admission: RE | Admit: 2020-02-19 | Discharge: 2020-02-19 | Disposition: A | Discharge status: Home / Self Care | Payer: BC Managed Care – PPO | Source: Ambulatory Visit | Attending: Radiation Oncology | Admitting: Radiation Oncology

## 2020-02-19 DIAGNOSIS — Z170 Estrogen receptor positive status [ER+]: Secondary | ICD-10-CM | POA: Diagnosis not present

## 2020-02-19 DIAGNOSIS — C50411 Malignant neoplasm of upper-outer quadrant of right female breast: Secondary | ICD-10-CM | POA: Diagnosis not present

## 2020-02-19 DIAGNOSIS — C773 Secondary and unspecified malignant neoplasm of axilla and upper limb lymph nodes: Secondary | ICD-10-CM | POA: Diagnosis not present

## 2020-02-19 DIAGNOSIS — Z510 Encounter for antineoplastic radiation therapy: Secondary | ICD-10-CM | POA: Diagnosis not present

## 2020-02-22 ENCOUNTER — Ambulatory Visit
Admission: RE | Admit: 2020-02-22 | Discharge: 2020-02-22 | Disposition: A | Discharge status: Home / Self Care | Payer: BC Managed Care – PPO | Source: Ambulatory Visit | Attending: Radiation Oncology | Admitting: Radiation Oncology

## 2020-02-22 ENCOUNTER — Other Ambulatory Visit: Payer: Self-pay

## 2020-02-22 DIAGNOSIS — Z510 Encounter for antineoplastic radiation therapy: Secondary | ICD-10-CM | POA: Diagnosis not present

## 2020-02-22 DIAGNOSIS — C50411 Malignant neoplasm of upper-outer quadrant of right female breast: Secondary | ICD-10-CM | POA: Diagnosis not present

## 2020-02-22 DIAGNOSIS — Z170 Estrogen receptor positive status [ER+]: Secondary | ICD-10-CM | POA: Diagnosis not present

## 2020-02-22 DIAGNOSIS — C773 Secondary and unspecified malignant neoplasm of axilla and upper limb lymph nodes: Secondary | ICD-10-CM | POA: Diagnosis not present

## 2020-02-22 NOTE — Progress Notes (Signed)
The patient was seen in infusion. She reports having a diffuse rash over her bilateral upper extremities. She was given a prescription for triamcinolone lotion.  Sandi Mealy, [facility] Physician Assistant

## 2020-02-23 ENCOUNTER — Ambulatory Visit
Admission: RE | Admit: 2020-02-23 | Discharge: 2020-02-23 | Disposition: A | Discharge status: Home / Self Care | Payer: BC Managed Care – PPO | Source: Ambulatory Visit | Attending: Radiation Oncology | Admitting: Radiation Oncology

## 2020-02-23 DIAGNOSIS — Z170 Estrogen receptor positive status [ER+]: Secondary | ICD-10-CM | POA: Diagnosis not present

## 2020-02-23 DIAGNOSIS — Z510 Encounter for antineoplastic radiation therapy: Secondary | ICD-10-CM | POA: Diagnosis not present

## 2020-02-23 DIAGNOSIS — C773 Secondary and unspecified malignant neoplasm of axilla and upper limb lymph nodes: Secondary | ICD-10-CM | POA: Diagnosis not present

## 2020-02-23 DIAGNOSIS — C50411 Malignant neoplasm of upper-outer quadrant of right female breast: Secondary | ICD-10-CM | POA: Diagnosis not present

## 2020-02-24 ENCOUNTER — Ambulatory Visit
Admission: RE | Admit: 2020-02-24 | Discharge: 2020-02-24 | Disposition: A | Discharge status: Home / Self Care | Payer: BC Managed Care – PPO | Source: Ambulatory Visit | Attending: Radiation Oncology | Admitting: Radiation Oncology

## 2020-02-24 DIAGNOSIS — C773 Secondary and unspecified malignant neoplasm of axilla and upper limb lymph nodes: Secondary | ICD-10-CM | POA: Diagnosis not present

## 2020-02-24 DIAGNOSIS — Z170 Estrogen receptor positive status [ER+]: Secondary | ICD-10-CM | POA: Diagnosis not present

## 2020-02-24 DIAGNOSIS — Z510 Encounter for antineoplastic radiation therapy: Secondary | ICD-10-CM | POA: Diagnosis not present

## 2020-02-24 DIAGNOSIS — C50411 Malignant neoplasm of upper-outer quadrant of right female breast: Secondary | ICD-10-CM | POA: Diagnosis not present

## 2020-02-29 ENCOUNTER — Ambulatory Visit (HOSPITAL_COMMUNITY)
Admission: RE | Admit: 2020-02-29 | Discharge: 2020-02-29 | Disposition: A | Discharge status: Home / Self Care | Payer: BC Managed Care – PPO | Source: Ambulatory Visit | Attending: Hematology and Oncology | Admitting: Hematology and Oncology

## 2020-02-29 ENCOUNTER — Other Ambulatory Visit: Payer: Self-pay

## 2020-02-29 ENCOUNTER — Ambulatory Visit
Admission: RE | Admit: 2020-02-29 | Discharge: 2020-02-29 | Disposition: A | Discharge status: Home / Self Care | Payer: BC Managed Care – PPO | Source: Ambulatory Visit | Attending: Radiation Oncology | Admitting: Radiation Oncology

## 2020-02-29 DIAGNOSIS — Z170 Estrogen receptor positive status [ER+]: Secondary | ICD-10-CM

## 2020-02-29 DIAGNOSIS — Z0189 Encounter for other specified special examinations: Secondary | ICD-10-CM | POA: Diagnosis not present

## 2020-02-29 DIAGNOSIS — I340 Nonrheumatic mitral (valve) insufficiency: Secondary | ICD-10-CM | POA: Diagnosis not present

## 2020-02-29 DIAGNOSIS — Z510 Encounter for antineoplastic radiation therapy: Secondary | ICD-10-CM | POA: Diagnosis not present

## 2020-02-29 DIAGNOSIS — C50411 Malignant neoplasm of upper-outer quadrant of right female breast: Secondary | ICD-10-CM | POA: Diagnosis not present

## 2020-02-29 DIAGNOSIS — C773 Secondary and unspecified malignant neoplasm of axilla and upper limb lymph nodes: Secondary | ICD-10-CM | POA: Diagnosis not present

## 2020-02-29 LAB — ECHOCARDIOGRAM COMPLETE
Area-P 1/2: 3.12 cm2
S' Lateral: 2.9 cm

## 2020-02-29 NOTE — Progress Notes (Signed)
  Echocardiogram 2D Echocardiogram has been performed.  Stephanie Brewer 02/29/2020, 10:56 AM

## 2020-03-01 ENCOUNTER — Ambulatory Visit
Admission: RE | Admit: 2020-03-01 | Discharge: 2020-03-01 | Disposition: A | Discharge status: Home / Self Care | Payer: BC Managed Care – PPO | Source: Ambulatory Visit | Attending: Radiation Oncology | Admitting: Radiation Oncology

## 2020-03-01 DIAGNOSIS — Z510 Encounter for antineoplastic radiation therapy: Secondary | ICD-10-CM | POA: Diagnosis not present

## 2020-03-01 DIAGNOSIS — Z170 Estrogen receptor positive status [ER+]: Secondary | ICD-10-CM | POA: Diagnosis not present

## 2020-03-01 DIAGNOSIS — C50411 Malignant neoplasm of upper-outer quadrant of right female breast: Secondary | ICD-10-CM | POA: Diagnosis not present

## 2020-03-01 DIAGNOSIS — C773 Secondary and unspecified malignant neoplasm of axilla and upper limb lymph nodes: Secondary | ICD-10-CM | POA: Diagnosis not present

## 2020-03-02 ENCOUNTER — Ambulatory Visit
Admission: RE | Admit: 2020-03-02 | Discharge: 2020-03-02 | Disposition: A | Discharge status: Home / Self Care | Payer: BC Managed Care – PPO | Source: Ambulatory Visit | Attending: Radiation Oncology | Admitting: Radiation Oncology

## 2020-03-02 DIAGNOSIS — C773 Secondary and unspecified malignant neoplasm of axilla and upper limb lymph nodes: Secondary | ICD-10-CM | POA: Diagnosis not present

## 2020-03-02 DIAGNOSIS — C50411 Malignant neoplasm of upper-outer quadrant of right female breast: Secondary | ICD-10-CM | POA: Diagnosis not present

## 2020-03-02 DIAGNOSIS — Z170 Estrogen receptor positive status [ER+]: Secondary | ICD-10-CM | POA: Insufficient documentation

## 2020-03-02 DIAGNOSIS — Z510 Encounter for antineoplastic radiation therapy: Secondary | ICD-10-CM | POA: Diagnosis not present

## 2020-03-03 ENCOUNTER — Telehealth: Payer: Self-pay | Admitting: Hematology and Oncology

## 2020-03-03 ENCOUNTER — Ambulatory Visit
Admission: RE | Admit: 2020-03-03 | Discharge: 2020-03-03 | Disposition: A | Discharge status: Home / Self Care | Payer: BC Managed Care – PPO | Source: Ambulatory Visit | Attending: Radiation Oncology | Admitting: Radiation Oncology

## 2020-03-03 DIAGNOSIS — Z170 Estrogen receptor positive status [ER+]: Secondary | ICD-10-CM | POA: Diagnosis not present

## 2020-03-03 DIAGNOSIS — Z510 Encounter for antineoplastic radiation therapy: Secondary | ICD-10-CM | POA: Diagnosis not present

## 2020-03-03 DIAGNOSIS — C50411 Malignant neoplasm of upper-outer quadrant of right female breast: Secondary | ICD-10-CM | POA: Diagnosis not present

## 2020-03-03 DIAGNOSIS — C773 Secondary and unspecified malignant neoplasm of axilla and upper limb lymph nodes: Secondary | ICD-10-CM | POA: Diagnosis not present

## 2020-03-03 NOTE — Assessment & Plan Note (Signed)
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 Neoadjuvant chemotherapy with TCH Perjeta 6 cyclescompleted 12/16/19 (Perjeta D/Ced due to diarrhea)followed by Herceptin versus Kadcylamaintenance for 1 year 2. Followed by breast conserving surgery with sentinel lymph node study 3. Followed by adjuvant radiation therapy if patient had lumpectomy  Breast MRI: 3.8 cm tumor and 1 axillary lymphnode. ---------------------------------------------------------------------------------------------------------------------------------------------------- Breast MRI 12/21/19: Complete imaging response 01/14/2020: Right lumpectomy: Benign, no evidence of residual cancer, 0/4 lymph nodes negative, additional margins also negative, complete pathologic response  Pathology review: I discussed the final pathology report and provided her with a copy of this report.  This offers her best overall prognosis.  Treatment plan: Continue with Herceptin maintenance every 3 weeks. Labs and MD visit every 6 weeks.  She is and planning for adjuvant radiation with Dr. Isidore Moos.

## 2020-03-03 NOTE — Telephone Encounter (Signed)
Rescheduled per provider. Called and spoke with pt, confirmed 12/3 appts

## 2020-03-03 NOTE — Progress Notes (Signed)
Patient Care Team: [name], DO as PCP - General (Family Medicine) Mauro Kaufmann, RN as Oncology Nurse Navigator Rockwell [place], RN as Oncology Nurse Navigator Gwyndolyn Kaufman, RN as Registered Nurse Gwyndolyn Kaufman, RN as Registered Nurse  DIAGNOSIS:    ICD-10-CM   1. Malignant neoplasm of upper-outer quadrant of right breast in female, estrogen receptor positive ([place])  C50.411    Z17.0     SUMMARY OF ONCOLOGIC HISTORY: Oncology History  Malignant neoplasm of upper-outer quadrant of right breast in female, estrogen receptor positive ([place])  07/28/2019 Initial Diagnosis   Screening mammogram detected right breast mass 11:30 position subareolar 1.3 cm, indeterminate 5 mm mass: Biopsy fibroadenoma, right axillary lymph node present, biopsy of the lymph node and the mass revealed grade 2 IDC ER 10%, PR 0%, Ki-67 45%, HER-2 +3+ by [facility]   08/20/2019 Cancer Staging   Staging form: Breast, AJCC 8th Edition - Clinical stage from 08/20/2019: Stage IIA (cT2, cN1, cM0, G2, ER+, PR-, HER2+) - Signed by Eppie Gibson, MD on 01/26/2020   09/02/2019 - 12/18/2019 Chemotherapy   The patient had dexamethasone (DECADRON) 4 MG tablet, 4 mg (100 % of original dose 4 mg), Oral, Daily, 1 of 1 cycle, Start date: 08/20/2019, End date: 01/06/2020 Dose modification: 4 mg (original dose 4 mg, Cycle 0) palonosetron (ALOXI) injection 0.25 mg, 0.25 mg, Intravenous,  Once, 6 of 6 cycles Administration: 0.25 mg (09/02/2019), 0.25 mg (09/23/2019), 0.25 mg (11/25/2019), 0.25 mg (12/16/2019), 0.25 mg (10/14/2019), 0.25 mg (11/04/2019) pegfilgrastim-jmdb (FULPHILA) injection 6 mg, 6 mg, Subcutaneous,  Once, 6 of 6 cycles Administration: 6 mg (09/04/2019), 6 mg (09/25/2019), 6 mg (11/27/2019), 6 mg (12/18/2019), 6 mg (10/16/2019), 6 mg (11/06/2019) CARBOplatin (PARAPLATIN) 700 mg in sodium chloride 0.9 % 250 mL chemo infusion, 700 mg (100 % of original dose 700 mg), Intravenous,  Once, 6 of 6 cycles Dose modification: 700  mg (original dose 700 mg, Cycle 1), 700 mg (original dose 700 mg, Cycle 5), 600 mg (original dose 700 mg, Cycle 5, Reason: Other (see comments), Comment: dose reduce to 600 mg for CINV per [name]) Administration: 700 mg (09/02/2019), 700 mg (09/23/2019), 600 mg (11/25/2019), 600 mg (12/16/2019), 700 mg (10/14/2019), 600 mg (11/04/2019) DOCEtaxel (TAXOTERE) 150 mg in sodium chloride 0.9 % 250 mL chemo infusion, 75 mg/m2 = 150 mg, Intravenous,  Once, 6 of 6 cycles Dose modification: 50 mg/m2 (original dose 75 mg/m2, Cycle 5, Reason: Dose not tolerated), 50 mg/m2 (original dose 75 mg/m2, Cycle 4, Reason: Dose not tolerated) Administration: 150 mg (09/02/2019), 150 mg (09/23/2019), 100 mg (11/25/2019), 100 mg (12/16/2019), 150 mg (10/14/2019), 100 mg (11/04/2019) fosaprepitant (EMEND) 150 mg in sodium chloride 0.9 % 145 mL IVPB, 150 mg, Intravenous,  Once, 6 of 6 cycles Administration: 150 mg (09/02/2019), 150 mg (09/23/2019), 150 mg (11/25/2019), 150 mg (12/16/2019), 150 mg (10/14/2019), 150 mg (11/04/2019) pertuzumab (PERJETA) 420 mg in sodium chloride 0.9 % 250 mL chemo infusion, 420 mg (100 % of original dose 420 mg), Intravenous, Once, 5 of 5 cycles Dose modification: 420 mg (original dose 420 mg, Cycle 1, Reason: Provider Judgment) Administration: 420 mg (09/02/2019), 420 mg (09/23/2019), 420 mg (11/25/2019), 420 mg (10/14/2019), 420 mg (11/04/2019) trastuzumab-dkst (OGIVRI) 672 mg in sodium chloride 0.9 % 250 mL chemo infusion, 8 mg/kg = 672 mg, Intravenous,  Once, 6 of 6 cycles Administration: 672 mg (09/02/2019), 504 mg (09/23/2019), 504 mg (11/25/2019), 504 mg (12/16/2019), 504 mg (10/14/2019), 504 mg (11/04/2019)  for chemotherapy  treatment.    01/06/2020 -  Chemotherapy   The patient had trastuzumab-dkst (OGIVRI) 504 mg in sodium chloride 0.9 % 250 mL chemo infusion, 483 mg (100 % of original dose 6 mg/kg), Intravenous,  Once, 3 of 6 cycles Dose modification: 6 mg/kg (original dose 6 mg/kg, Cycle 1, Reason: Provider  Judgment) Administration: 504 mg (01/06/2020), 450 mg (01/27/2020), 450 mg (02/17/2020)  for chemotherapy treatment.    01/14/2020 Surgery   Right lumpectomy Dwain Sarna): no evidence of residual carcinoma, 4 right axillary lymph nodes negative for carcinoma.   02/16/2020 -  Radiation Therapy   Adjuvant radiation     CHIEF COMPLIANT: Herceptin maintenance   INTERVAL HISTORY: Stephanie Brewer is a 54 y.o. with above-mentioned history of right breast cancerwho completedneoadjuvant chemotherapy, underwent a right lumpectomy, and is currently on Herceptin maintenance and radiation. Echo on 02/29/20 showed an ejection fraction of 65-70%. She presents to the clinic todayto review the pathology report and discuss further treatment. She is doing very well with radiation therapy.  She gets nauseated frequently and has a dry cough in the evenings.  ALLERGIES:  is allergic to hyoscyamine, doxycycline, and nexium [esomeprazole].  MEDICATIONS:  Current Outpatient Medications  Medication Sig Dispense Refill  . ALLERGY CHILDRENS 12.5 MG/5ML liquid SWISH & SPIT 1WTEASPOONFUL 4 TIMES A DAYTAS NEEDED FOR MOUTH PAIN    . diphenoxylate-atropine (LOMOTIL) 2.5-0.025 MG tablet 1 to 2 four times daily as needed for diarrhea 60 tablet 3  . furosemide (LASIX) 20 MG tablet Take 1 tablet (20 mg total) by mouth 2 (two) times daily. 60 tablet 0  . ketoconazole (NIZORAL) 2 % cream Apply 1 application topically daily. 15 g 0  . lidocaine (XYLOCAINE) 2 % solution SWISH & SPIT 1WTEASPOONFUL 4 TIMES A DAYTAS NEEDED FOR MOUTH PAIN    . lidocaine (XYLOCAINE) 2 % solution Use as directed 5 mLs in the mouth or throat as needed for mouth pain. 200 mL 1  . magic mouthwash w/lidocaine SOLN Take 5 mLs by mouth 4 (four) times daily as needed for mouth pain. 120 mL 0  . Minocycline HCl 90 MG TB24 Take 1 tablet by mouth daily. Only takes prn for break outs    . nystatin (MYCOSTATIN) 100000 UNIT/ML suspension SMARTSIG:1 Teaspoon By  Mouth 4 Times Daily PRN    . potassium chloride SA (KLOR-CON) 20 MEQ tablet Take 1 tablet (20 mEq total) by mouth 2 (two) times daily. 60 tablet 0  . rivaroxaban (XARELTO) 20 MG TABS tablet Take 1 tablet (20 mg total) by mouth daily with supper. x5 months. Then stop. 30 tablet 4  . triamcinolone lotion (KENALOG) 0.1 % Apply 1 application topically 3 (three) times daily. 120 mL 2   No current facility-administered medications for this visit.   Facility-Administered Medications Ordered in Other Visits  Medication Dose Route Frequency Provider Last Rate Last Admin  . sodium chloride flush (NS) 0.9 % injection 10 mL  10 mL Intravenous PRN Serena Croissant, MD   10 mL at 09/02/19 0839    PHYSICAL EXAMINATION: ECOG PERFORMANCE STATUS: 1 - Symptomatic but completely ambulatory  There were no vitals filed for this visit. There were no vitals filed for this visit.  LABORATORY DATA:  I have reviewed the data as listed CMP Latest Ref Rng & Units 03/04/2020 01/27/2020 01/11/2020  Glucose 70 - 99 mg/dL 99 610(V) 511(P)  BUN 6 - 20 mg/dL 8 10 5(L)  Creatinine 5.30 - 1.00 mg/dL 7.41 3.22 5.29  Sodium 135 - 145  mmol/L 142 140 141  Potassium 3.5 - 5.1 mmol/L 4.0 3.6 3.8  Chloride 98 - 111 mmol/L 109 107 106  CO2 22 - 32 mmol/L _0 Calcium 8.9 - 10.3 mg/dL 9.1 9.1 8.9  Total Protein 6.5 - 8.1 g/dL 6.4(L) 6.4(L) -  Total Bilirubin 0.3 - 1.2 mg/dL 0.4 0.3 -  Alkaline Phos 38 - 126 U/L 98 102 -  AST 15 - 41 U/L 25 23 -  ALT 0 - 44 U/L 17 22 -    Lab Results  Component Value Date   WBC 2.8 (L) 03/04/2020   HGB 11.0 (L) 03/04/2020   HCT 33.6 (L) 03/04/2020   MCV 91.3 03/04/2020   PLT 192 03/04/2020   NEUTROABS 1.4 (L) 03/04/2020    ASSESSMENT & PLAN:  Malignant neoplasm of upper-outer quadrant of right breast in female, estrogen receptor positive ([place]) 07/28/2019:Screening mammogram detected right breast mass 11:30 position subareolar 1.3 cm, indeterminate 5 mm mass: Biopsy fibroadenoma,  right axillary lymph node present, biopsy of the lymph node and the mass revealed grade 2 IDC ER 10%, PR 0%, Ki-67 45%, HER-2 +3+ by IHC T1 cN1 M0 stage IIa  Treatment plan: 1. Neoadjuvant chemotherapy with TCH Perjeta 6 cyclescompleted 12/16/19 (Perjeta D/Ced due to diarrhea)followed by Herceptin versus Kadcylamaintenance for 1 year 2. Followed by breast conserving surgery with sentinel lymph node study 3. Followed by adjuvant radiation therapy if patient had lumpectomy  Breast MRI: 3.8 cm tumor and 1 axillary lymphnode. ---------------------------------------------------------------------------------------------------------------------------------------------------- Breast MRI 12/21/19: Complete imaging response 01/14/2020: Right lumpectomy: Benign, no evidence of residual cancer, 0/4 lymph nodes negative, additional margins also negative, complete pathologic response  Treatment plan: Continue with Herceptin maintenance every 3 weeks. Acid reflux with a dry cough: I sent a prescription for Pepcid.  She has an allergy to Nexium and therefore he did not want to use Protonix.  Labs and MD visit every 6 weeks. Patient is receiving adjuvant radiation with Dr. Isidore Moos  No orders of the defined types were placed in this encounter.  The patient has a good understanding of the overall plan. she agrees with it. she will call with any problems that may develop before the next visit here.  Total time spent: 30 mins including face to face time and time spent for planning, charting and coordination of care  Nicholas Lose, MD 03/04/2020  I, Cloyde Reams Dorshimer, am acting as scribe for Dr. Nicholas Lose.  I have reviewed the above documentation for accuracy and completeness, and I agree with the above.

## 2020-03-04 ENCOUNTER — Inpatient Hospital Stay (HOSPITAL_BASED_OUTPATIENT_CLINIC_OR_DEPARTMENT_OTHER): Payer: BC Managed Care – PPO | Admitting: Hematology and Oncology

## 2020-03-04 ENCOUNTER — Other Ambulatory Visit: Payer: Self-pay

## 2020-03-04 ENCOUNTER — Other Ambulatory Visit: Payer: Self-pay | Admitting: Hematology and Oncology

## 2020-03-04 ENCOUNTER — Ambulatory Visit
Admission: RE | Admit: 2020-03-04 | Discharge: 2020-03-04 | Disposition: A | Discharge status: Home / Self Care | Payer: BC Managed Care – PPO | Source: Ambulatory Visit | Attending: Radiation Oncology | Admitting: Radiation Oncology

## 2020-03-04 ENCOUNTER — Inpatient Hospital Stay: Payer: BC Managed Care – PPO | Attending: Hematology and Oncology

## 2020-03-04 ENCOUNTER — Inpatient Hospital Stay: Payer: BC Managed Care – PPO

## 2020-03-04 DIAGNOSIS — Z95828 Presence of other vascular implants and grafts: Secondary | ICD-10-CM

## 2020-03-04 DIAGNOSIS — C773 Secondary and unspecified malignant neoplasm of axilla and upper limb lymph nodes: Secondary | ICD-10-CM | POA: Insufficient documentation

## 2020-03-04 DIAGNOSIS — Z170 Estrogen receptor positive status [ER+]: Secondary | ICD-10-CM

## 2020-03-04 DIAGNOSIS — C50411 Malignant neoplasm of upper-outer quadrant of right female breast: Secondary | ICD-10-CM

## 2020-03-04 DIAGNOSIS — Z510 Encounter for antineoplastic radiation therapy: Secondary | ICD-10-CM | POA: Diagnosis not present

## 2020-03-04 DIAGNOSIS — Z5112 Encounter for antineoplastic immunotherapy: Secondary | ICD-10-CM | POA: Diagnosis not present

## 2020-03-04 LAB — CBC WITH DIFFERENTIAL (CANCER CENTER ONLY)
Abs Immature Granulocytes: 0.01 10*3/uL (ref 0.00–0.07)
Basophils Absolute: 0 10*3/uL (ref 0.0–0.1)
Basophils Relative: 1 %
Eosinophils Absolute: 0.2 10*3/uL (ref 0.0–0.5)
Eosinophils Relative: 8 %
HCT: 33.6 % — ABNORMAL LOW (ref 36.0–46.0)
Hemoglobin: 11 g/dL — ABNORMAL LOW (ref 12.0–15.0)
Immature Granulocytes: 0 %
Lymphocytes Relative: 26 %
Lymphs Abs: 0.7 10*3/uL (ref 0.7–4.0)
MCH: 29.9 pg (ref 26.0–34.0)
MCHC: 32.7 g/dL (ref 30.0–36.0)
MCV: 91.3 fL (ref 80.0–100.0)
Monocytes Absolute: 0.4 10*3/uL (ref 0.1–1.0)
Monocytes Relative: 13 %
Neutro Abs: 1.4 10*3/uL — ABNORMAL LOW (ref 1.7–7.7)
Neutrophils Relative %: 52 %
Platelet Count: 192 10*3/uL (ref 150–400)
RBC: 3.68 MIL/uL — ABNORMAL LOW (ref 3.87–5.11)
RDW: 13 % (ref 11.5–15.5)
WBC Count: 2.8 10*3/uL — ABNORMAL LOW (ref 4.0–10.5)
nRBC: 0 % (ref 0.0–0.2)

## 2020-03-04 LAB — CMP (CANCER CENTER ONLY)
ALT: 17 U/L (ref 0–44)
AST: 25 U/L (ref 15–41)
Albumin: 3.3 g/dL — ABNORMAL LOW (ref 3.5–5.0)
Alkaline Phosphatase: 98 U/L (ref 38–126)
Anion gap: 7 (ref 5–15)
BUN: 8 mg/dL (ref 6–20)
CO2: 26 mmol/L (ref 22–32)
Calcium: 9.1 mg/dL (ref 8.9–10.3)
Chloride: 109 mmol/L (ref 98–111)
Creatinine: 0.68 mg/dL (ref 0.44–1.00)
GFR, Estimated: >60 mL/min (ref >60)
Glucose, Bld: 99 mg/dL (ref 70–99)
Potassium: 4 mmol/L (ref 3.5–5.1)
Sodium: 142 mmol/L (ref 135–145)
Total Bilirubin: 0.4 mg/dL (ref 0.3–1.2)
Total Protein: 6.4 g/dL — ABNORMAL LOW (ref 6.5–8.1)

## 2020-03-04 MED ORDER — HEPARIN SOD (PORK) LOCK FLUSH 100 UNIT/ML IV SOLN
500.0000 [IU] | Freq: Once | INTRAVENOUS | Status: AC
  Administered 2020-03-04: 500 [IU]
  Filled 2020-03-04: qty 5

## 2020-03-04 MED ORDER — SODIUM CHLORIDE 0.9% FLUSH
10.0000 mL | Freq: Once | INTRAVENOUS | Status: AC
  Administered 2020-03-04: 10 mL
  Filled 2020-03-04: qty 10

## 2020-03-04 MED ORDER — FAMOTIDINE 20 MG PO TABS: 20.0000 mg | ORAL_TABLET | Freq: Two times a day (BID) | ORAL | 0 refills | Status: DC

## 2020-03-04 NOTE — Patient Instructions (Signed)

## 2020-03-07 ENCOUNTER — Ambulatory Visit
Admission: RE | Admit: 2020-03-07 | Discharge: 2020-03-07 | Disposition: A | Discharge status: Home / Self Care | Payer: BC Managed Care – PPO | Source: Ambulatory Visit | Attending: Radiation Oncology | Admitting: Radiation Oncology

## 2020-03-07 DIAGNOSIS — Z170 Estrogen receptor positive status [ER+]: Secondary | ICD-10-CM | POA: Diagnosis not present

## 2020-03-07 DIAGNOSIS — M65332 Trigger finger, left middle finger: Secondary | ICD-10-CM | POA: Diagnosis not present

## 2020-03-07 DIAGNOSIS — Z510 Encounter for antineoplastic radiation therapy: Secondary | ICD-10-CM | POA: Diagnosis not present

## 2020-03-07 DIAGNOSIS — C50411 Malignant neoplasm of upper-outer quadrant of right female breast: Secondary | ICD-10-CM | POA: Diagnosis not present

## 2020-03-07 DIAGNOSIS — C773 Secondary and unspecified malignant neoplasm of axilla and upper limb lymph nodes: Secondary | ICD-10-CM | POA: Diagnosis not present

## 2020-03-08 ENCOUNTER — Ambulatory Visit
Admission: RE | Admit: 2020-03-08 | Discharge: 2020-03-08 | Disposition: A | Discharge status: Home / Self Care | Payer: BC Managed Care – PPO | Source: Ambulatory Visit | Attending: Radiation Oncology | Admitting: Radiation Oncology

## 2020-03-08 ENCOUNTER — Other Ambulatory Visit: Payer: Self-pay | Admitting: Orthopedic Surgery

## 2020-03-08 DIAGNOSIS — C50411 Malignant neoplasm of upper-outer quadrant of right female breast: Secondary | ICD-10-CM | POA: Diagnosis not present

## 2020-03-08 DIAGNOSIS — Z170 Estrogen receptor positive status [ER+]: Secondary | ICD-10-CM | POA: Diagnosis not present

## 2020-03-08 DIAGNOSIS — C773 Secondary and unspecified malignant neoplasm of axilla and upper limb lymph nodes: Secondary | ICD-10-CM | POA: Diagnosis not present

## 2020-03-08 DIAGNOSIS — Z510 Encounter for antineoplastic radiation therapy: Secondary | ICD-10-CM | POA: Diagnosis not present

## 2020-03-09 ENCOUNTER — Other Ambulatory Visit: Payer: BC Managed Care – PPO

## 2020-03-09 ENCOUNTER — Ambulatory Visit
Admission: RE | Admit: 2020-03-09 | Discharge: 2020-03-09 | Disposition: A | Discharge status: Home / Self Care | Payer: BC Managed Care – PPO | Source: Ambulatory Visit | Attending: Radiation Oncology | Admitting: Radiation Oncology

## 2020-03-09 ENCOUNTER — Other Ambulatory Visit: Payer: Self-pay

## 2020-03-09 ENCOUNTER — Ambulatory Visit: Payer: BC Managed Care – PPO | Admitting: Hematology and Oncology

## 2020-03-09 ENCOUNTER — Inpatient Hospital Stay: Payer: BC Managed Care – PPO

## 2020-03-09 VITALS — BP 148/74 | HR 72 | Temp 98.3°F | Resp 17

## 2020-03-09 DIAGNOSIS — Z5112 Encounter for antineoplastic immunotherapy: Secondary | ICD-10-CM | POA: Diagnosis not present

## 2020-03-09 DIAGNOSIS — C50411 Malignant neoplasm of upper-outer quadrant of right female breast: Secondary | ICD-10-CM

## 2020-03-09 DIAGNOSIS — C773 Secondary and unspecified malignant neoplasm of axilla and upper limb lymph nodes: Secondary | ICD-10-CM | POA: Diagnosis not present

## 2020-03-09 DIAGNOSIS — Z170 Estrogen receptor positive status [ER+]: Secondary | ICD-10-CM | POA: Diagnosis not present

## 2020-03-09 DIAGNOSIS — Z510 Encounter for antineoplastic radiation therapy: Secondary | ICD-10-CM | POA: Diagnosis not present

## 2020-03-09 MED ORDER — SODIUM CHLORIDE 0.9% FLUSH
10.0000 mL | INTRAVENOUS | Status: DC | PRN
  Administered 2020-03-09: 10 mL
  Filled 2020-03-09: qty 10

## 2020-03-09 MED ORDER — DIPHENHYDRAMINE HCL 25 MG PO CAPS
25.0000 mg | ORAL_CAPSULE | Freq: Once | ORAL | Status: AC
  Administered 2020-03-09: 25 mg via ORAL

## 2020-03-09 MED ORDER — HEPARIN SOD (PORK) LOCK FLUSH 100 UNIT/ML IV SOLN
500.0000 [IU] | Freq: Once | INTRAVENOUS | Status: AC | PRN
  Administered 2020-03-09: 500 [IU]
  Filled 2020-03-09: qty 5

## 2020-03-09 MED ORDER — DIPHENHYDRAMINE HCL 25 MG PO CAPS
ORAL_CAPSULE | ORAL | Status: AC
  Filled 2020-03-09: qty 1

## 2020-03-09 MED ORDER — TRASTUZUMAB-DKST CHEMO 150 MG IV SOLR
450.0000 mg | Freq: Once | INTRAVENOUS | Status: AC
  Administered 2020-03-09: 450 mg via INTRAVENOUS
  Filled 2020-03-09: qty 21.43

## 2020-03-09 MED ORDER — ACETAMINOPHEN 325 MG PO TABS
650.0000 mg | ORAL_TABLET | Freq: Once | ORAL | Status: AC
  Administered 2020-03-09: 650 mg via ORAL

## 2020-03-09 MED ORDER — ACETAMINOPHEN 325 MG PO TABS
ORAL_TABLET | ORAL | Status: AC
  Filled 2020-03-09: qty 2

## 2020-03-09 MED ORDER — SODIUM CHLORIDE 0.9 % IV SOLN
Freq: Once | INTRAVENOUS | Status: AC
  Filled 2020-03-09: qty 250

## 2020-03-09 NOTE — Progress Notes (Signed)
Patient stable with no complaints upon departure of infusion room.

## 2020-03-09 NOTE — Patient Instructions (Signed)
[facility] Discharge Instructions for Patients Receiving Chemotherapy  Today you received the following chemotherapy agents trastuzumab.  To help prevent nausea and vomiting after your treatment, we encourage you to take your nausea medication as directed.    If you develop nausea and vomiting that is not controlled by your nausea medication, call the clinic.   BELOW ARE SYMPTOMS THAT SHOULD BE REPORTED IMMEDIATELY:  *FEVER GREATER THAN 100.5 F  *CHILLS WITH OR WITHOUT FEVER  NAUSEA AND VOMITING THAT IS NOT CONTROLLED WITH YOUR NAUSEA MEDICATION  *UNUSUAL SHORTNESS OF BREATH  *UNUSUAL BRUISING OR BLEEDING  TENDERNESS IN MOUTH AND THROAT WITH OR WITHOUT PRESENCE OF ULCERS  *URINARY PROBLEMS  *BOWEL PROBLEMS  UNUSUAL RASH Items with * indicate a potential emergency and should be followed up as soon as possible.  Feel free to call the clinic should you have any questions or concerns. The clinic phone number is [phone].  Please show the CHEMO ALERT CARD at check-in to the Emergency Department and triage nurse.   

## 2020-03-10 ENCOUNTER — Ambulatory Visit
Admission: RE | Admit: 2020-03-10 | Discharge: 2020-03-10 | Disposition: A | Discharge status: Home / Self Care | Payer: BC Managed Care – PPO | Source: Ambulatory Visit | Attending: Radiation Oncology | Admitting: Radiation Oncology

## 2020-03-10 DIAGNOSIS — Z510 Encounter for antineoplastic radiation therapy: Secondary | ICD-10-CM | POA: Diagnosis not present

## 2020-03-10 DIAGNOSIS — C773 Secondary and unspecified malignant neoplasm of axilla and upper limb lymph nodes: Secondary | ICD-10-CM | POA: Diagnosis not present

## 2020-03-10 DIAGNOSIS — C50411 Malignant neoplasm of upper-outer quadrant of right female breast: Secondary | ICD-10-CM | POA: Diagnosis not present

## 2020-03-10 DIAGNOSIS — Z170 Estrogen receptor positive status [ER+]: Secondary | ICD-10-CM | POA: Diagnosis not present

## 2020-03-11 ENCOUNTER — Ambulatory Visit
Admission: RE | Admit: 2020-03-11 | Discharge: 2020-03-11 | Disposition: A | Discharge status: Home / Self Care | Payer: BC Managed Care – PPO | Source: Ambulatory Visit | Attending: Radiation Oncology | Admitting: Radiation Oncology

## 2020-03-11 DIAGNOSIS — C773 Secondary and unspecified malignant neoplasm of axilla and upper limb lymph nodes: Secondary | ICD-10-CM | POA: Diagnosis not present

## 2020-03-11 DIAGNOSIS — C50411 Malignant neoplasm of upper-outer quadrant of right female breast: Secondary | ICD-10-CM | POA: Diagnosis not present

## 2020-03-11 DIAGNOSIS — Z170 Estrogen receptor positive status [ER+]: Secondary | ICD-10-CM | POA: Diagnosis not present

## 2020-03-11 DIAGNOSIS — Z510 Encounter for antineoplastic radiation therapy: Secondary | ICD-10-CM | POA: Diagnosis not present

## 2020-03-14 ENCOUNTER — Ambulatory Visit: Payer: BC Managed Care – PPO

## 2020-03-14 ENCOUNTER — Ambulatory Visit
Admission: RE | Admit: 2020-03-14 | Discharge: 2020-03-14 | Disposition: A | Discharge status: Home / Self Care | Payer: BC Managed Care – PPO | Source: Ambulatory Visit | Attending: Radiation Oncology | Admitting: Radiation Oncology

## 2020-03-14 DIAGNOSIS — Z170 Estrogen receptor positive status [ER+]: Secondary | ICD-10-CM | POA: Diagnosis not present

## 2020-03-14 DIAGNOSIS — Z510 Encounter for antineoplastic radiation therapy: Secondary | ICD-10-CM | POA: Diagnosis not present

## 2020-03-14 DIAGNOSIS — C773 Secondary and unspecified malignant neoplasm of axilla and upper limb lymph nodes: Secondary | ICD-10-CM | POA: Diagnosis not present

## 2020-03-14 DIAGNOSIS — C50411 Malignant neoplasm of upper-outer quadrant of right female breast: Secondary | ICD-10-CM | POA: Diagnosis not present

## 2020-03-15 ENCOUNTER — Ambulatory Visit
Admission: RE | Admit: 2020-03-15 | Discharge: 2020-03-15 | Disposition: A | Discharge status: Home / Self Care | Payer: BC Managed Care – PPO | Source: Ambulatory Visit | Attending: Radiation Oncology | Admitting: Radiation Oncology

## 2020-03-15 DIAGNOSIS — Z510 Encounter for antineoplastic radiation therapy: Secondary | ICD-10-CM | POA: Diagnosis not present

## 2020-03-15 DIAGNOSIS — Z170 Estrogen receptor positive status [ER+]: Secondary | ICD-10-CM | POA: Diagnosis not present

## 2020-03-15 DIAGNOSIS — C773 Secondary and unspecified malignant neoplasm of axilla and upper limb lymph nodes: Secondary | ICD-10-CM | POA: Diagnosis not present

## 2020-03-15 DIAGNOSIS — C50411 Malignant neoplasm of upper-outer quadrant of right female breast: Secondary | ICD-10-CM | POA: Diagnosis not present

## 2020-03-16 ENCOUNTER — Ambulatory Visit
Admission: RE | Admit: 2020-03-16 | Discharge: 2020-03-16 | Disposition: A | Discharge status: Home / Self Care | Payer: BC Managed Care – PPO | Source: Ambulatory Visit | Attending: Radiation Oncology | Admitting: Radiation Oncology

## 2020-03-16 DIAGNOSIS — Z510 Encounter for antineoplastic radiation therapy: Secondary | ICD-10-CM | POA: Diagnosis not present

## 2020-03-16 DIAGNOSIS — C773 Secondary and unspecified malignant neoplasm of axilla and upper limb lymph nodes: Secondary | ICD-10-CM | POA: Diagnosis not present

## 2020-03-16 DIAGNOSIS — Z170 Estrogen receptor positive status [ER+]: Secondary | ICD-10-CM | POA: Diagnosis not present

## 2020-03-16 DIAGNOSIS — C50411 Malignant neoplasm of upper-outer quadrant of right female breast: Secondary | ICD-10-CM | POA: Diagnosis not present

## 2020-03-17 ENCOUNTER — Ambulatory Visit
Admission: RE | Admit: 2020-03-17 | Discharge: 2020-03-17 | Disposition: A | Discharge status: Home / Self Care | Payer: BC Managed Care – PPO | Source: Ambulatory Visit | Attending: Radiation Oncology | Admitting: Radiation Oncology

## 2020-03-17 ENCOUNTER — Other Ambulatory Visit: Payer: Self-pay

## 2020-03-17 DIAGNOSIS — Z510 Encounter for antineoplastic radiation therapy: Secondary | ICD-10-CM | POA: Diagnosis not present

## 2020-03-17 DIAGNOSIS — C50411 Malignant neoplasm of upper-outer quadrant of right female breast: Secondary | ICD-10-CM | POA: Diagnosis not present

## 2020-03-17 DIAGNOSIS — C773 Secondary and unspecified malignant neoplasm of axilla and upper limb lymph nodes: Secondary | ICD-10-CM | POA: Diagnosis not present

## 2020-03-17 DIAGNOSIS — Z170 Estrogen receptor positive status [ER+]: Secondary | ICD-10-CM | POA: Diagnosis not present

## 2020-03-18 ENCOUNTER — Ambulatory Visit
Admission: RE | Admit: 2020-03-18 | Discharge: 2020-03-18 | Disposition: A | Discharge status: Home / Self Care | Payer: BC Managed Care – PPO | Source: Ambulatory Visit | Attending: Radiation Oncology | Admitting: Radiation Oncology

## 2020-03-18 DIAGNOSIS — C50411 Malignant neoplasm of upper-outer quadrant of right female breast: Secondary | ICD-10-CM | POA: Diagnosis not present

## 2020-03-18 DIAGNOSIS — C773 Secondary and unspecified malignant neoplasm of axilla and upper limb lymph nodes: Secondary | ICD-10-CM | POA: Diagnosis not present

## 2020-03-18 DIAGNOSIS — Z510 Encounter for antineoplastic radiation therapy: Secondary | ICD-10-CM | POA: Diagnosis not present

## 2020-03-18 DIAGNOSIS — Z170 Estrogen receptor positive status [ER+]: Secondary | ICD-10-CM | POA: Diagnosis not present

## 2020-03-21 ENCOUNTER — Ambulatory Visit
Admission: RE | Admit: 2020-03-21 | Discharge: 2020-03-21 | Disposition: A | Discharge status: Home / Self Care | Payer: BC Managed Care – PPO | Source: Ambulatory Visit | Attending: Radiation Oncology | Admitting: Radiation Oncology

## 2020-03-21 DIAGNOSIS — Z510 Encounter for antineoplastic radiation therapy: Secondary | ICD-10-CM | POA: Diagnosis not present

## 2020-03-21 DIAGNOSIS — Z170 Estrogen receptor positive status [ER+]: Secondary | ICD-10-CM | POA: Diagnosis not present

## 2020-03-21 DIAGNOSIS — C50411 Malignant neoplasm of upper-outer quadrant of right female breast: Secondary | ICD-10-CM | POA: Diagnosis not present

## 2020-03-21 DIAGNOSIS — C773 Secondary and unspecified malignant neoplasm of axilla and upper limb lymph nodes: Secondary | ICD-10-CM | POA: Diagnosis not present

## 2020-03-22 ENCOUNTER — Ambulatory Visit
Admission: RE | Admit: 2020-03-22 | Discharge: 2020-03-22 | Disposition: A | Discharge status: Home / Self Care | Payer: BC Managed Care – PPO | Source: Ambulatory Visit | Attending: Radiation Oncology | Admitting: Radiation Oncology

## 2020-03-22 DIAGNOSIS — Z510 Encounter for antineoplastic radiation therapy: Secondary | ICD-10-CM | POA: Diagnosis not present

## 2020-03-22 DIAGNOSIS — C773 Secondary and unspecified malignant neoplasm of axilla and upper limb lymph nodes: Secondary | ICD-10-CM | POA: Diagnosis not present

## 2020-03-22 DIAGNOSIS — Z170 Estrogen receptor positive status [ER+]: Secondary | ICD-10-CM | POA: Diagnosis not present

## 2020-03-22 DIAGNOSIS — C50411 Malignant neoplasm of upper-outer quadrant of right female breast: Secondary | ICD-10-CM | POA: Diagnosis not present

## 2020-03-23 ENCOUNTER — Other Ambulatory Visit: Payer: Self-pay

## 2020-03-23 ENCOUNTER — Ambulatory Visit
Admission: RE | Admit: 2020-03-23 | Discharge: 2020-03-23 | Disposition: A | Discharge status: Home / Self Care | Payer: BC Managed Care – PPO | Source: Ambulatory Visit | Attending: Radiation Oncology | Admitting: Radiation Oncology

## 2020-03-23 DIAGNOSIS — Z510 Encounter for antineoplastic radiation therapy: Secondary | ICD-10-CM | POA: Diagnosis not present

## 2020-03-23 DIAGNOSIS — Z170 Estrogen receptor positive status [ER+]: Secondary | ICD-10-CM | POA: Diagnosis not present

## 2020-03-23 DIAGNOSIS — C773 Secondary and unspecified malignant neoplasm of axilla and upper limb lymph nodes: Secondary | ICD-10-CM | POA: Diagnosis not present

## 2020-03-23 DIAGNOSIS — C50411 Malignant neoplasm of upper-outer quadrant of right female breast: Secondary | ICD-10-CM | POA: Diagnosis not present

## 2020-03-24 ENCOUNTER — Ambulatory Visit
Admission: RE | Admit: 2020-03-24 | Discharge: 2020-03-24 | Disposition: A | Discharge status: Home / Self Care | Payer: BC Managed Care – PPO | Source: Ambulatory Visit | Attending: Radiation Oncology | Admitting: Radiation Oncology

## 2020-03-24 DIAGNOSIS — Z510 Encounter for antineoplastic radiation therapy: Secondary | ICD-10-CM | POA: Diagnosis not present

## 2020-03-24 DIAGNOSIS — C773 Secondary and unspecified malignant neoplasm of axilla and upper limb lymph nodes: Secondary | ICD-10-CM | POA: Diagnosis not present

## 2020-03-24 DIAGNOSIS — C50411 Malignant neoplasm of upper-outer quadrant of right female breast: Secondary | ICD-10-CM | POA: Diagnosis not present

## 2020-03-24 DIAGNOSIS — Z170 Estrogen receptor positive status [ER+]: Secondary | ICD-10-CM | POA: Diagnosis not present

## 2020-03-28 ENCOUNTER — Ambulatory Visit
Admission: RE | Admit: 2020-03-28 | Discharge: 2020-03-28 | Disposition: A | Discharge status: Home / Self Care | Payer: BC Managed Care – PPO | Source: Ambulatory Visit | Attending: Radiation Oncology | Admitting: Radiation Oncology

## 2020-03-28 DIAGNOSIS — Z170 Estrogen receptor positive status [ER+]: Secondary | ICD-10-CM | POA: Diagnosis not present

## 2020-03-28 DIAGNOSIS — C50411 Malignant neoplasm of upper-outer quadrant of right female breast: Secondary | ICD-10-CM | POA: Diagnosis not present

## 2020-03-28 DIAGNOSIS — C773 Secondary and unspecified malignant neoplasm of axilla and upper limb lymph nodes: Secondary | ICD-10-CM | POA: Diagnosis not present

## 2020-03-28 DIAGNOSIS — Z510 Encounter for antineoplastic radiation therapy: Secondary | ICD-10-CM | POA: Diagnosis not present

## 2020-03-29 ENCOUNTER — Encounter: Payer: Self-pay | Admitting: *Deleted

## 2020-03-29 ENCOUNTER — Ambulatory Visit
Admission: RE | Admit: 2020-03-29 | Discharge: 2020-03-29 | Disposition: A | Discharge status: Home / Self Care | Payer: BC Managed Care – PPO | Source: Ambulatory Visit | Attending: Radiation Oncology | Admitting: Radiation Oncology

## 2020-03-29 DIAGNOSIS — C773 Secondary and unspecified malignant neoplasm of axilla and upper limb lymph nodes: Secondary | ICD-10-CM | POA: Diagnosis not present

## 2020-03-29 DIAGNOSIS — C50411 Malignant neoplasm of upper-outer quadrant of right female breast: Secondary | ICD-10-CM | POA: Diagnosis not present

## 2020-03-29 DIAGNOSIS — Z170 Estrogen receptor positive status [ER+]: Secondary | ICD-10-CM | POA: Diagnosis not present

## 2020-03-29 DIAGNOSIS — Z510 Encounter for antineoplastic radiation therapy: Secondary | ICD-10-CM | POA: Diagnosis not present

## 2020-03-30 ENCOUNTER — Inpatient Hospital Stay: Payer: BC Managed Care – PPO

## 2020-03-30 ENCOUNTER — Other Ambulatory Visit: Payer: Self-pay

## 2020-03-30 ENCOUNTER — Encounter: Payer: Self-pay | Admitting: Radiation Oncology

## 2020-03-30 ENCOUNTER — Ambulatory Visit
Admission: RE | Admit: 2020-03-30 | Discharge: 2020-03-30 | Disposition: A | Discharge status: Home / Self Care | Payer: BC Managed Care – PPO | Source: Ambulatory Visit | Attending: Radiation Oncology | Admitting: Radiation Oncology

## 2020-03-30 VITALS — BP 123/79 | HR 88 | Temp 98.1°F | Resp 16 | Wt 173.5 lb

## 2020-03-30 DIAGNOSIS — Z5112 Encounter for antineoplastic immunotherapy: Secondary | ICD-10-CM | POA: Diagnosis not present

## 2020-03-30 DIAGNOSIS — Z170 Estrogen receptor positive status [ER+]: Secondary | ICD-10-CM

## 2020-03-30 DIAGNOSIS — Z510 Encounter for antineoplastic radiation therapy: Secondary | ICD-10-CM | POA: Diagnosis not present

## 2020-03-30 DIAGNOSIS — C50411 Malignant neoplasm of upper-outer quadrant of right female breast: Secondary | ICD-10-CM

## 2020-03-30 DIAGNOSIS — C773 Secondary and unspecified malignant neoplasm of axilla and upper limb lymph nodes: Secondary | ICD-10-CM | POA: Diagnosis not present

## 2020-03-30 MED ORDER — HEPARIN SOD (PORK) LOCK FLUSH 100 UNIT/ML IV SOLN
500.0000 [IU] | Freq: Once | INTRAVENOUS | Status: DC | PRN
  Filled 2020-03-30: qty 5

## 2020-03-30 MED ORDER — SODIUM CHLORIDE 0.9% FLUSH
10.0000 mL | INTRAVENOUS | Status: DC | PRN
  Filled 2020-03-30: qty 10

## 2020-03-30 MED ORDER — DIPHENHYDRAMINE HCL 25 MG PO CAPS
ORAL_CAPSULE | ORAL | Status: AC
  Filled 2020-03-30: qty 1

## 2020-03-30 MED ORDER — ACETAMINOPHEN 325 MG PO TABS
ORAL_TABLET | ORAL | Status: AC
  Filled 2020-03-30: qty 2

## 2020-03-30 MED ORDER — ACETAMINOPHEN 325 MG PO TABS
650.0000 mg | ORAL_TABLET | Freq: Once | ORAL | Status: AC
  Administered 2020-03-30: 650 mg via ORAL

## 2020-03-30 MED ORDER — DIPHENHYDRAMINE HCL 25 MG PO CAPS
25.0000 mg | ORAL_CAPSULE | Freq: Once | ORAL | Status: AC
  Administered 2020-03-30: 25 mg via ORAL

## 2020-03-30 MED ORDER — SODIUM CHLORIDE 0.9 % IV SOLN
Freq: Once | INTRAVENOUS | Status: AC
  Filled 2020-03-30: qty 250

## 2020-03-30 MED ORDER — TRASTUZUMAB-DKST CHEMO 150 MG IV SOLR
450.0000 mg | Freq: Once | INTRAVENOUS | Status: AC
  Administered 2020-03-30: 450 mg via INTRAVENOUS
  Filled 2020-03-30: qty 21.43

## 2020-03-30 NOTE — Patient Instructions (Signed)
[facility] Discharge Instructions for Patients Receiving Chemotherapy  Today you received the following chemotherapy agents: Ogivri (trastuzumab-dkst)  To help prevent nausea and vomiting after your treatment, we encourage you to take your nausea medication as directed.   If you develop nausea and vomiting that is not controlled by your nausea medication, call the clinic.   BELOW ARE SYMPTOMS THAT SHOULD BE REPORTED IMMEDIATELY:  *FEVER GREATER THAN 100.5 F  *CHILLS WITH OR WITHOUT FEVER  NAUSEA AND VOMITING THAT IS NOT CONTROLLED WITH YOUR NAUSEA MEDICATION  *UNUSUAL SHORTNESS OF BREATH  *UNUSUAL BRUISING OR BLEEDING  TENDERNESS IN MOUTH AND THROAT WITH OR WITHOUT PRESENCE OF ULCERS  *URINARY PROBLEMS  *BOWEL PROBLEMS  UNUSUAL RASH Items with * indicate a potential emergency and should be followed up as soon as possible.  Feel free to call the clinic should you have any questions or concerns. The clinic phone number is [phone].  Please show the CHEMO ALERT CARD at check-in to the Emergency Department and triage nurse.

## 2020-04-18 ENCOUNTER — Encounter (HOSPITAL_BASED_OUTPATIENT_CLINIC_OR_DEPARTMENT_OTHER): Payer: Self-pay | Admitting: Orthopedic Surgery

## 2020-04-18 ENCOUNTER — Other Ambulatory Visit: Payer: Self-pay

## 2020-04-19 ENCOUNTER — Other Ambulatory Visit: Payer: Self-pay | Admitting: *Deleted

## 2020-04-19 DIAGNOSIS — C50411 Malignant neoplasm of upper-outer quadrant of right female breast: Secondary | ICD-10-CM

## 2020-04-19 DIAGNOSIS — Z170 Estrogen receptor positive status [ER+]: Secondary | ICD-10-CM

## 2020-04-19 NOTE — Assessment & Plan Note (Signed)
/  27/2021:Screening mammogram detected right breast mass 11:30 position subareolar 1.3 cm, indeterminate 5 mm mass: Biopsy fibroadenoma, right axillary lymph node present, biopsy of the lymph node and the mass revealed grade 2 IDC ER 10%, PR 0%, Ki-67 45%, HER-2 +3+ by IHC T1 cN1 M0 stage IIa  Treatment plan: 1. Neoadjuvant chemotherapy with [place] Perjeta 6 cyclescompleted 12/16/19 (Perjeta D/Ced due to diarrhea)followed by Herceptin maintenance for 1 year 2. 01/14/2020: Right lumpectomy: Benign, no evidence of residual cancer, 0/4 lymph nodes negative, additional margins also negative, complete pathologic response (pathologic complete response) 3. Followed by adjuvant radiation therapy completed 03/30/2020  Breast MRI: 3.8 cm tumor and 1 axillary lymphnode. ---------------------------------------------------------------------------------------------------------------------------------------------------- Treatment plan:Continue with Herceptin maintenanceevery 3 weeks. Acid reflux with a dry cough:on Pepcid.  She has an allergy to Nexium and therefore he did not want to use Protonix. Next echocardiogram will be done end of February Herceptin every 3 weeks and labs and MD visit every 6 weeks.

## 2020-04-19 NOTE — Progress Notes (Signed)
Patient Care Team: [name], DO as PCP - General (Family Medicine) Mauro Kaufmann, RN as Oncology Nurse Navigator Rockwell Germany, RN as Oncology Nurse Navigator Gwyndolyn Kaufman, RN as Registered Nurse Gwyndolyn Kaufman, RN as Registered Nurse  DIAGNOSIS:    ICD-10-CM   1. Malignant neoplasm of upper-outer quadrant of right breast in female, estrogen receptor positive ([place])  C50.411 ECHOCARDIOGRAM COMPLETE   Z17.0 MM DIAG BREAST TOMO BILATERAL    SUMMARY OF ONCOLOGIC HISTORY: Oncology History  Malignant neoplasm of upper-outer quadrant of right breast in female, estrogen receptor positive ([place])  07/28/2019 Initial Diagnosis   Screening mammogram detected right breast mass 11:30 position subareolar 1.3 cm, indeterminate 5 mm mass: Biopsy fibroadenoma, right axillary lymph node present, biopsy of the lymph node and the mass revealed grade 2 IDC ER 10%, PR 0%, Ki-67 45%, HER-2 +3+ by [facility]   08/20/2019 Cancer Staging   Staging form: Breast, AJCC 8th Edition - Clinical stage from 08/20/2019: Stage IIA (cT2, cN1, cM0, G2, ER+, PR-, HER2+) - Signed by Eppie Gibson, MD on 01/26/2020   09/02/2019 - 12/18/2019 Chemotherapy   The patient had dexamethasone (DECADRON) 4 MG tablet, 4 mg (100 % of original dose 4 mg), Oral, Daily, 1 of 1 cycle, Start date: 08/20/2019, End date: 01/06/2020 Dose modification: 4 mg (original dose 4 mg, Cycle 0) palonosetron (ALOXI) injection 0.25 mg, 0.25 mg, Intravenous,  Once, 6 of 6 cycles Administration: 0.25 mg (09/02/2019), 0.25 mg (09/23/2019), 0.25 mg (11/25/2019), 0.25 mg (12/16/2019), 0.25 mg (10/14/2019), 0.25 mg (11/04/2019) pegfilgrastim-jmdb (FULPHILA) injection 6 mg, 6 mg, Subcutaneous,  Once, 6 of 6 cycles Administration: 6 mg (09/04/2019), 6 mg (09/25/2019), 6 mg (11/27/2019), 6 mg (12/18/2019), 6 mg (10/16/2019), 6 mg (11/06/2019) CARBOplatin (PARAPLATIN) 700 mg in sodium chloride 0.9 % 250 mL chemo infusion, 700 mg (100 % of original dose 700 mg),  Intravenous,  Once, 6 of 6 cycles Dose modification: 700 mg (original dose 700 mg, Cycle 1), 700 mg (original dose 700 mg, Cycle 5), 600 mg (original dose 700 mg, Cycle 5, Reason: Other (see comments), Comment: dose reduce to 600 mg for CINV per [name]) Administration: 700 mg (09/02/2019), 700 mg (09/23/2019), 600 mg (11/25/2019), 600 mg (12/16/2019), 700 mg (10/14/2019), 600 mg (11/04/2019) DOCEtaxel (TAXOTERE) 150 mg in sodium chloride 0.9 % 250 mL chemo infusion, 75 mg/m2 = 150 mg, Intravenous,  Once, 6 of 6 cycles Dose modification: 50 mg/m2 (original dose 75 mg/m2, Cycle 5, Reason: Dose not tolerated), 50 mg/m2 (original dose 75 mg/m2, Cycle 4, Reason: Dose not tolerated) Administration: 150 mg (09/02/2019), 150 mg (09/23/2019), 100 mg (11/25/2019), 100 mg (12/16/2019), 150 mg (10/14/2019), 100 mg (11/04/2019) fosaprepitant (EMEND) 150 mg in sodium chloride 0.9 % 145 mL IVPB, 150 mg, Intravenous,  Once, 6 of 6 cycles Administration: 150 mg (09/02/2019), 150 mg (09/23/2019), 150 mg (11/25/2019), 150 mg (12/16/2019), 150 mg (10/14/2019), 150 mg (11/04/2019) pertuzumab (PERJETA) 420 mg in sodium chloride 0.9 % 250 mL chemo infusion, 420 mg (100 % of original dose 420 mg), Intravenous, Once, 5 of 5 cycles Dose modification: 420 mg (original dose 420 mg, Cycle 1, Reason: Provider Judgment) Administration: 420 mg (09/02/2019), 420 mg (09/23/2019), 420 mg (11/25/2019), 420 mg (10/14/2019), 420 mg (11/04/2019) trastuzumab-dkst (OGIVRI) 672 mg in sodium chloride 0.9 % 250 mL chemo infusion, 8 mg/kg = 672 mg, Intravenous,  Once, 6 of 6 cycles Administration: 672 mg (09/02/2019), 504 mg (09/23/2019), 504 mg (11/25/2019), 504 mg (12/16/2019), 504 mg (10/14/2019), 504  mg (11/04/2019)  for chemotherapy treatment.    01/06/2020 -  Chemotherapy      Patient is on Antibody Plan: BREAST TRASTUZUMAB Q21D    01/14/2020 Surgery   Right lumpectomy Donne Hazel): no evidence of residual carcinoma, 4 right axillary lymph nodes negative for  carcinoma.   02/16/2020 - 03/30/2020 Radiation Therapy   Adjuvant radiation     CHIEF COMPLIANT: Herceptin maintenance   INTERVAL HISTORY: Stephanie Brewer is a 55 y.o. with above-mentioned history of right breast cancerwho completedneoadjuvant chemotherapy, underwent a right lumpectomy, radiation, and is currently on Herceptin maintenance.She presents to the clinic today for treatment.    She is tolerating Herceptin extremely well without any problems or concerns.  Last echocardiogram done at the end of November was excellent.  ALLERGIES:  is allergic to hyoscyamine, doxycycline, and nexium [esomeprazole].  MEDICATIONS:  Current Outpatient Medications  Medication Sig Dispense Refill  . diphenoxylate-atropine (LOMOTIL) 2.5-0.025 MG tablet 1 to 2 four times daily as needed for diarrhea 60 tablet 3  . famotidine (PEPCID) 20 MG tablet Take 1 tablet (20 mg total) by mouth 2 (two) times daily. 60 tablet 0  . lidocaine-prilocaine (EMLA) cream Apply 1 application topically as needed.    . Minocycline HCl 90 MG TB24 Take 1 tablet by mouth daily. Only takes prn for break outs    . lidocaine (XYLOCAINE) 2 % solution SWISH & SPIT 1WTEASPOONFUL 4 TIMES A DAYTAS NEEDED FOR MOUTH PAIN (Patient not taking: Reported on 04/20/2020)     No current facility-administered medications for this visit.   Facility-Administered Medications Ordered in Other Visits  Medication Dose Route Frequency Provider Last Rate Last Admin  . heparin lock flush 100 unit/mL  500 Units Intracatheter Once PRN Nicholas Lose, MD      . sodium chloride flush (NS) 0.9 % injection 10 mL  10 mL Intravenous PRN Nicholas Lose, MD   10 mL at 09/02/19 0839  . sodium chloride flush (NS) 0.9 % injection 10 mL  10 mL Intracatheter PRN Nicholas Lose, MD      . trastuzumab-dkst (OGIVRI) 450 mg in sodium chloride 0.9 % 250 mL chemo infusion  450 mg Intravenous Once Nicholas Lose, MD        PHYSICAL EXAMINATION: ECOG PERFORMANCE STATUS: 1 -  Symptomatic but completely ambulatory  Vitals:   04/20/20 0830  BP: 118/73  Pulse: 76  Resp: 16  Temp: 98.8 F (37.1 C)  SpO2: 100%   Filed Weights   04/20/20 0830  Weight: 172 lb 12.8 oz (78.4 kg)     LABORATORY DATA:  I have reviewed the data as listed CMP Latest Ref Rng & Units 04/20/2020 03/04/2020 01/27/2020  Glucose 70 - 99 mg/dL 117(H) 99 126(H)  BUN 6 - 20 mg/dL 10 8 10   Creatinine 0.44 - 1.00 mg/dL 0.79 0.68 0.68  Sodium 135 - 145 mmol/L 142 142 140  Potassium 3.5 - 5.1 mmol/L 4.0 4.0 3.6  Chloride 98 - 111 mmol/L 107 109 107  CO2 22 - 32 mmol/L 26 26 27   Calcium 8.9 - 10.3 mg/dL 9.2 9.1 9.1  Total Protein 6.5 - 8.1 g/dL 6.8 6.4(L) 6.4(L)  Total Bilirubin 0.3 - 1.2 mg/dL 0.5 0.4 0.3  Alkaline Phos 38 - 126 U/L 111 98 102  AST 15 - 41 U/L 26 25 23   ALT 0 - 44 U/L 22 17 22     Lab Results  Component Value Date   WBC 3.5 (L) 04/20/2020   HGB 12.5 04/20/2020  HCT 38.6 04/20/2020   MCV 88.9 04/20/2020   PLT 177 04/20/2020   NEUTROABS 2.2 04/20/2020    ASSESSMENT & PLAN:  Malignant neoplasm of upper-outer quadrant of right breast in female, estrogen receptor positive ([place]) /27/2021:Screening mammogram detected right breast mass 11:30 position subareolar 1.3 cm, indeterminate 5 mm mass: Biopsy fibroadenoma, right axillary lymph node present, biopsy of the lymph node and the mass revealed grade 2 IDC ER 10%, PR 0%, Ki-67 45%, HER-2 +3+ by IHC T1 cN1 M0 stage IIa  Treatment plan: 1. Neoadjuvant chemotherapy with Antietam Perjeta 6 cyclescompleted 12/16/19 (Perjeta D/Ced due to diarrhea)followed by Herceptin maintenance for 1 year 2. 01/14/2020: Right lumpectomy: Benign, no evidence of residual cancer, 0/4 lymph nodes negative, additional margins also negative, complete pathologic response (pathologic complete response) 3. Followed by adjuvant radiation therapy completed 03/30/2020  Breast MRI: 3.8 cm tumor and 1 axillary  lymphnode. ---------------------------------------------------------------------------------------------------------------------------------------------------- Treatment plan:Continue with Herceptin maintenanceevery 3 weeks. Echocardiogram 02/29/2020: EF 65 to 70%  Acid reflux with a dry cough:on Pepcid.  She has an allergy to Nexium and therefore he did not want to use Protonix. Mild nausea probably related to gastritis Leukopenia: Her WBC count has been fluctuating up and down.  There is no concern for infection risk.  Next echocardiogram will be done end of February Trigger finger surgery planned for next week.  Herceptin every 3 weeks and labs and MD visit every 6 weeks.     Orders Placed This Encounter  Procedures  . MM DIAG BREAST TOMO BILATERAL    Standing Status:   Future    Standing Expiration Date:   04/20/2021    Order Specific Question:   Reason for Exam (SYMPTOM  OR DIAGNOSIS REQUIRED)    Answer:   Annual mammogram with H/O breast cancer    Order Specific Question:   Is the patient pregnant?    Answer:   No    Order Specific Question:   Preferred imaging location?    Answer:   [facility]    Order Specific Question:   Release to patient    Answer:   Immediate  . ECHOCARDIOGRAM COMPLETE    Standing Status:   Future    Standing Expiration Date:   04/20/2021    Order Specific Question:   Where should this test be performed    Answer:   [place]    Order Specific Question:   Perflutren DEFINITY (image enhancing agent) should be administered unless hypersensitivity or allergy exist    Answer:   Administer Perflutren    Order Specific Question:   Reason for exam-Echo    Answer:   Chemo  Z09    Order Specific Question:   Release to patient    Answer:   Immediate   The patient has a good understanding of the overall plan. she agrees with it. she will call with any problems that may develop before the next visit here.  Total time spent: 30 mins including  face to face time and time spent for planning, charting and coordination of care  Nicholas Lose, MD 04/20/2020  I, Cloyde Reams Dorshimer, am acting as scribe for Dr. Nicholas Lose.  I have reviewed the above documentation for accuracy and completeness, and I agree with the above.

## 2020-04-20 ENCOUNTER — Inpatient Hospital Stay: Payer: BC Managed Care – PPO

## 2020-04-20 ENCOUNTER — Inpatient Hospital Stay: Payer: BC Managed Care – PPO | Admitting: Hematology and Oncology

## 2020-04-20 ENCOUNTER — Other Ambulatory Visit: Payer: Self-pay

## 2020-04-20 ENCOUNTER — Inpatient Hospital Stay: Payer: BC Managed Care – PPO | Attending: Hematology and Oncology

## 2020-04-20 DIAGNOSIS — Z170 Estrogen receptor positive status [ER+]: Secondary | ICD-10-CM | POA: Insufficient documentation

## 2020-04-20 DIAGNOSIS — C50411 Malignant neoplasm of upper-outer quadrant of right female breast: Secondary | ICD-10-CM | POA: Diagnosis not present

## 2020-04-20 DIAGNOSIS — Z5112 Encounter for antineoplastic immunotherapy: Secondary | ICD-10-CM | POA: Insufficient documentation

## 2020-04-20 LAB — CBC WITH DIFFERENTIAL (CANCER CENTER ONLY)
Abs Immature Granulocytes: 0.01 10*3/uL (ref 0.00–0.07)
Basophils Absolute: 0 10*3/uL (ref 0.0–0.1)
Basophils Relative: 1 %
Eosinophils Absolute: 0.3 10*3/uL (ref 0.0–0.5)
Eosinophils Relative: 8 %
HCT: 38.6 % (ref 36.0–46.0)
Hemoglobin: 12.5 g/dL (ref 12.0–15.0)
Immature Granulocytes: 0 %
Lymphocytes Relative: 15 %
Lymphs Abs: 0.5 10*3/uL — ABNORMAL LOW (ref 0.7–4.0)
MCH: 28.8 pg (ref 26.0–34.0)
MCHC: 32.4 g/dL (ref 30.0–36.0)
MCV: 88.9 fL (ref 80.0–100.0)
Monocytes Absolute: 0.4 10*3/uL (ref 0.1–1.0)
Monocytes Relative: 12 %
Neutro Abs: 2.2 10*3/uL (ref 1.7–7.7)
Neutrophils Relative %: 64 %
Platelet Count: 177 10*3/uL (ref 150–400)
RBC: 4.34 MIL/uL (ref 3.87–5.11)
RDW: 13.3 % (ref 11.5–15.5)
WBC Count: 3.5 10*3/uL — ABNORMAL LOW (ref 4.0–10.5)
nRBC: 0 % (ref 0.0–0.2)

## 2020-04-20 LAB — CMP (CANCER CENTER ONLY)
ALT: 22 U/L (ref 0–44)
AST: 26 U/L (ref 15–41)
Albumin: 3.5 g/dL (ref 3.5–5.0)
Alkaline Phosphatase: 111 U/L (ref 38–126)
Anion gap: 9 (ref 5–15)
BUN: 10 mg/dL (ref 6–20)
CO2: 26 mmol/L (ref 22–32)
Calcium: 9.2 mg/dL (ref 8.9–10.3)
Chloride: 107 mmol/L (ref 98–111)
Creatinine: 0.79 mg/dL (ref 0.44–1.00)
GFR, Estimated: >60 mL/min (ref >60)
Glucose, Bld: 117 mg/dL — ABNORMAL HIGH (ref 70–99)
Potassium: 4 mmol/L (ref 3.5–5.1)
Sodium: 142 mmol/L (ref 135–145)
Total Bilirubin: 0.5 mg/dL (ref 0.3–1.2)
Total Protein: 6.8 g/dL (ref 6.5–8.1)

## 2020-04-20 MED ORDER — SODIUM CHLORIDE 0.9% FLUSH
10.0000 mL | INTRAVENOUS | Status: DC | PRN
  Administered 2020-04-20: 10 mL
  Filled 2020-04-20: qty 10

## 2020-04-20 MED ORDER — ACETAMINOPHEN 325 MG PO TABS
650.0000 mg | ORAL_TABLET | Freq: Once | ORAL | Status: AC
  Administered 2020-04-20: 650 mg via ORAL

## 2020-04-20 MED ORDER — HEPARIN SOD (PORK) LOCK FLUSH 100 UNIT/ML IV SOLN
500.0000 [IU] | Freq: Once | INTRAVENOUS | Status: AC | PRN
  Administered 2020-04-20: 500 [IU]
  Filled 2020-04-20: qty 5

## 2020-04-20 MED ORDER — DIPHENHYDRAMINE HCL 25 MG PO CAPS
25.0000 mg | ORAL_CAPSULE | Freq: Once | ORAL | Status: AC
  Administered 2020-04-20: 25 mg via ORAL

## 2020-04-20 MED ORDER — ACETAMINOPHEN 325 MG PO TABS
ORAL_TABLET | ORAL | Status: AC
  Filled 2020-04-20: qty 2

## 2020-04-20 MED ORDER — SODIUM CHLORIDE 0.9 % IV SOLN
Freq: Once | INTRAVENOUS | Status: AC
  Filled 2020-04-20: qty 250

## 2020-04-20 MED ORDER — SODIUM CHLORIDE 0.9 % IV SOLN
450.0000 mg | Freq: Once | INTRAVENOUS | Status: AC
Start: 2020-04-20 — End: 2020-04-20
  Administered 2020-04-20: 450 mg via INTRAVENOUS
  Filled 2020-04-20: qty 21.43

## 2020-04-20 MED ORDER — DIPHENHYDRAMINE HCL 25 MG PO CAPS
ORAL_CAPSULE | ORAL | Status: AC
  Filled 2020-04-20: qty 1

## 2020-04-20 NOTE — Patient Instructions (Signed)
[facility] Discharge Instructions for Patients Receiving Chemotherapy  Today you received the following chemotherapy agents trastuzumab.  To help prevent nausea and vomiting after your treatment, we encourage you to take your nausea medication as directed.    If you develop nausea and vomiting that is not controlled by your nausea medication, call the clinic.   BELOW ARE SYMPTOMS THAT SHOULD BE REPORTED IMMEDIATELY:  *FEVER GREATER THAN 100.5 F  *CHILLS WITH OR WITHOUT FEVER  NAUSEA AND VOMITING THAT IS NOT CONTROLLED WITH YOUR NAUSEA MEDICATION  *UNUSUAL SHORTNESS OF BREATH  *UNUSUAL BRUISING OR BLEEDING  TENDERNESS IN MOUTH AND THROAT WITH OR WITHOUT PRESENCE OF ULCERS  *URINARY PROBLEMS  *BOWEL PROBLEMS  UNUSUAL RASH Items with * indicate a potential emergency and should be followed up as soon as possible.  Feel free to call the clinic should you have any questions or concerns. The clinic phone number is [phone].  Please show the CHEMO ALERT CARD at check-in to the Emergency Department and triage nurse.   

## 2020-04-21 ENCOUNTER — Telehealth: Payer: Self-pay | Admitting: Hematology and Oncology

## 2020-04-21 ENCOUNTER — Other Ambulatory Visit (HOSPITAL_COMMUNITY)
Admission: RE | Admit: 2020-04-21 | Discharge: 2020-04-21 | Disposition: A | Discharge status: Home / Self Care | Payer: BC Managed Care – PPO | Source: Ambulatory Visit | Attending: Orthopedic Surgery | Admitting: Orthopedic Surgery

## 2020-04-21 DIAGNOSIS — Z20822 Contact with and (suspected) exposure to covid-19: Secondary | ICD-10-CM | POA: Insufficient documentation

## 2020-04-21 DIAGNOSIS — Z01812 Encounter for preprocedural laboratory examination: Secondary | ICD-10-CM | POA: Insufficient documentation

## 2020-04-21 NOTE — Telephone Encounter (Signed)
Scheduled per 11/9 los. Pt will receive an updated appt calendar per next visit appt notes  

## 2020-04-21 NOTE — Progress Notes (Signed)

## 2020-04-22 LAB — SARS CORONAVIRUS 2 (TAT 6-24 HRS): SARS Coronavirus 2: NEGATIVE

## 2020-04-25 ENCOUNTER — Ambulatory Visit: Payer: BC Managed Care – PPO

## 2020-04-26 NOTE — Progress Notes (Signed)
  Patient Name: Stephanie Brewer MRN: [id number] DOB: 05-Dec-1965 Referring Physician: Nicholas Lose (Profile Not Attached) Date of Service: 03/30/2020 [facility], [place]                                                        End Of Treatment Note  Diagnoses: C50.411-Malignant neoplasm of upper-outer quadrant of right female breast  Cancer Staging: Cancer Staging Malignant neoplasm of upper-outer quadrant of right breast in female, estrogen receptor positive ([place]) Staging form: Breast, AJCC 8th Edition - Clinical stage from 08/20/2019: Stage IIA (cT2, cN1, cM0, G2, ER+, PR-, HER2+) - Signed by Eppie Gibson, MD on 01/26/2020 ypT0,ypN0  Intent: Curative  Radiation Treatment Dates: 02/15/2020 through 03/30/2020 Site Technique Total Dose (Gy) Dose per Fx (Gy) Completed Fx Beam Energies  Breast, Right: Breast_Rt_IM_LN 3D 50/50 2 25/25 6X, 10X  Breast, Right: Breast_Rt_PAB_SCV 3D 50/50 2 25/25 6X, 10X  Breast, Right: Breast_Rt_Bst 3D 10/10 2 5/5 6X   Narrative: The patient tolerated radiation therapy relatively well.   Plan: The patient will follow-up with radiation oncology in 33mo, or as needed.  -----------------------------------  Eppie Gibson, MD

## 2020-04-28 ENCOUNTER — Telehealth: Payer: Self-pay

## 2020-04-28 NOTE — Telephone Encounter (Signed)
I called the patient today about her upcoming follow-up appointment in radiation oncology.   Given the state of the COVID-19 pandemic, concerning case numbers in our community, and guidance from [facility], I offered a phone assessment with the patient to determine if coming to the clinic was necessary. She accepted.  I let the patient know that I had spoken with Dr. Isidore Moos, and she wanted them to know the importance of washing their hands for at least 20 seconds at a time, especially after going out in public, and before they eat.  Limit going out in public whenever possible. Do not touch your face, unless your hands are clean, such as when bathing. Get plenty of rest, eat well, and stay hydrated. Patient verbalized understanding and agreement  The patient denies any symptomatic concerns. She reports her fatigue has improved, and tenderness/swelling to breast has decreased significantly. She still has some tenderness to her axilla, but she states it doesn't impact her range of motion and she can perform all her PT exercises. Specifically, she reports good healing of her skin in the radiation fields.  Skin is intact and almost back to her baseline coloring. I recommended that she continue skin care by applying oil or lotion with vitamin E to the skin in the radiation fields, BID, for 2 more months.    Continue follow-up with medical oncology - follow-up is scheduled on 06/01/2020 with Dr. Nicholas Lose before her trastuzumab infusion.  I explained that yearly mammograms are important for patients with intact breast tissue, and physical exams are important after mastectomy for patients that cannot undergo mammography.  I encouraged her to call if she had further questions or concerns about her healing. Otherwise, she will follow-up PRN in radiation oncology. Patient is pleased with this plan, and we will cancel her upcoming follow-up to reduce the risk of COVID-19 transmission.

## 2020-04-29 ENCOUNTER — Ambulatory Visit: Payer: BC Managed Care – PPO | Admitting: Radiation Oncology

## 2020-05-02 ENCOUNTER — Other Ambulatory Visit: Payer: Self-pay

## 2020-05-02 ENCOUNTER — Encounter (HOSPITAL_BASED_OUTPATIENT_CLINIC_OR_DEPARTMENT_OTHER): Payer: Self-pay | Admitting: Orthopedic Surgery

## 2020-05-05 ENCOUNTER — Other Ambulatory Visit (HOSPITAL_COMMUNITY)
Admission: RE | Admit: 2020-05-05 | Discharge: 2020-05-05 | Disposition: A | Discharge status: Home / Self Care | Payer: BC Managed Care – PPO | Source: Ambulatory Visit | Attending: Orthopedic Surgery | Admitting: Orthopedic Surgery

## 2020-05-05 DIAGNOSIS — Z01812 Encounter for preprocedural laboratory examination: Secondary | ICD-10-CM | POA: Insufficient documentation

## 2020-05-05 DIAGNOSIS — Z20822 Contact with and (suspected) exposure to covid-19: Secondary | ICD-10-CM | POA: Insufficient documentation

## 2020-05-05 LAB — SARS CORONAVIRUS 2 (TAT 6-24 HRS): SARS Coronavirus 2: NEGATIVE

## 2020-05-06 ENCOUNTER — Ambulatory Visit: Payer: Self-pay | Admitting: Radiation Oncology

## 2020-05-09 ENCOUNTER — Encounter (HOSPITAL_BASED_OUTPATIENT_CLINIC_OR_DEPARTMENT_OTHER)
Admission: RE | Disposition: A | Discharge status: Home / Self Care | Payer: Self-pay | Source: Home / Self Care | Attending: Orthopedic Surgery

## 2020-05-09 ENCOUNTER — Ambulatory Visit (HOSPITAL_BASED_OUTPATIENT_CLINIC_OR_DEPARTMENT_OTHER): Payer: BC Managed Care – PPO | Admitting: Anesthesiology

## 2020-05-09 ENCOUNTER — Ambulatory Visit (HOSPITAL_BASED_OUTPATIENT_CLINIC_OR_DEPARTMENT_OTHER)
Admission: RE | Admit: 2020-05-09 | Discharge: 2020-05-09 | Disposition: A | Discharge status: Home / Self Care | Payer: BC Managed Care – PPO | Attending: Orthopedic Surgery | Admitting: Orthopedic Surgery

## 2020-05-09 ENCOUNTER — Other Ambulatory Visit: Payer: Self-pay

## 2020-05-09 ENCOUNTER — Encounter (HOSPITAL_BASED_OUTPATIENT_CLINIC_OR_DEPARTMENT_OTHER): Payer: Self-pay | Admitting: Orthopedic Surgery

## 2020-05-09 DIAGNOSIS — Z170 Estrogen receptor positive status [ER+]: Secondary | ICD-10-CM | POA: Diagnosis not present

## 2020-05-09 DIAGNOSIS — M65332 Trigger finger, left middle finger: Secondary | ICD-10-CM | POA: Insufficient documentation

## 2020-05-09 DIAGNOSIS — Z8249 Family history of ischemic heart disease and other diseases of the circulatory system: Secondary | ICD-10-CM | POA: Diagnosis not present

## 2020-05-09 DIAGNOSIS — Z86718 Personal history of other venous thrombosis and embolism: Secondary | ICD-10-CM | POA: Diagnosis not present

## 2020-05-09 DIAGNOSIS — Z881 Allergy status to other antibiotic agents status: Secondary | ICD-10-CM | POA: Insufficient documentation

## 2020-05-09 DIAGNOSIS — C50411 Malignant neoplasm of upper-outer quadrant of right female breast: Secondary | ICD-10-CM | POA: Diagnosis not present

## 2020-05-09 DIAGNOSIS — Z888 Allergy status to other drugs, medicaments and biological substances status: Secondary | ICD-10-CM | POA: Insufficient documentation

## 2020-05-09 DIAGNOSIS — Z79899 Other long term (current) drug therapy: Secondary | ICD-10-CM | POA: Insufficient documentation

## 2020-05-09 DIAGNOSIS — M65342 Trigger finger, left ring finger: Secondary | ICD-10-CM | POA: Diagnosis not present

## 2020-05-09 DIAGNOSIS — K219 Gastro-esophageal reflux disease without esophagitis: Secondary | ICD-10-CM | POA: Diagnosis not present

## 2020-05-09 HISTORY — PX: TRIGGER FINGER RELEASE: SHX641

## 2020-05-09 HISTORY — DX: Gastro-esophageal reflux disease without esophagitis: K21.9

## 2020-05-09 LAB — POCT PREGNANCY, URINE: Preg Test, Ur: NEGATIVE

## 2020-05-09 SURGERY — RELEASE, A1 PULLEY, FOR TRIGGER FINGER
Anesthesia: Monitor Anesthesia Care | Laterality: Left

## 2020-05-09 MED ORDER — FENTANYL CITRATE (PF) 100 MCG/2ML IJ SOLN
INTRAMUSCULAR | Status: AC
  Filled 2020-05-09: qty 2

## 2020-05-09 MED ORDER — OXYCODONE HCL 5 MG/5ML PO SOLN: 5.0000 mg | Freq: Once | ORAL | Status: DC | PRN

## 2020-05-09 MED ORDER — LIDOCAINE HCL (PF) 0.5 % IJ SOLN
INTRAMUSCULAR | Status: DC | PRN
  Administered 2020-05-09: 30 mL via INTRAVENOUS

## 2020-05-09 MED ORDER — OXYCODONE HCL 5 MG PO TABS: 5.0000 mg | ORAL_TABLET | Freq: Once | ORAL | Status: DC | PRN

## 2020-05-09 MED ORDER — PROPOFOL 500 MG/50ML IV EMUL
INTRAVENOUS | Status: DC | PRN
  Administered 2020-05-09: 75 ug/kg/min via INTRAVENOUS

## 2020-05-09 MED ORDER — BUPIVACAINE HCL (PF) 0.25 % IJ SOLN
INTRAMUSCULAR | Status: DC | PRN
  Administered 2020-05-09: 5 mL

## 2020-05-09 MED ORDER — FENTANYL CITRATE (PF) 100 MCG/2ML IJ SOLN: 25.0000 ug | INTRAMUSCULAR | Status: DC | PRN

## 2020-05-09 MED ORDER — HYDROCODONE-ACETAMINOPHEN 5-325 MG PO TABS: ORAL_TABLET | ORAL | 0 refills | Status: DC

## 2020-05-09 MED ORDER — FENTANYL CITRATE (PF) 100 MCG/2ML IJ SOLN
INTRAMUSCULAR | Status: DC | PRN
  Administered 2020-05-09: 100 ug via INTRAVENOUS

## 2020-05-09 MED ORDER — CEFAZOLIN SODIUM-DEXTROSE 2-4 GM/100ML-% IV SOLN
INTRAVENOUS | Status: AC
  Filled 2020-05-09: qty 100

## 2020-05-09 MED ORDER — LACTATED RINGERS IV SOLN: INTRAVENOUS | Status: DC

## 2020-05-09 MED ORDER — ONDANSETRON HCL 4 MG/2ML IJ SOLN
INTRAMUSCULAR | Status: DC | PRN
  Administered 2020-05-09: 4 mg via INTRAVENOUS

## 2020-05-09 MED ORDER — MIDAZOLAM HCL 5 MG/5ML IJ SOLN
INTRAMUSCULAR | Status: DC | PRN
  Administered 2020-05-09: 2 mg via INTRAVENOUS

## 2020-05-09 MED ORDER — AMISULPRIDE (ANTIEMETIC) 5 MG/2ML IV SOLN: 10.0000 mg | Freq: Once | INTRAVENOUS | Status: DC | PRN

## 2020-05-09 MED ORDER — CEFAZOLIN SODIUM-DEXTROSE 2-4 GM/100ML-% IV SOLN
2.0000 g | INTRAVENOUS | Status: AC
  Administered 2020-05-09: 2 g via INTRAVENOUS

## 2020-05-09 MED ORDER — MIDAZOLAM HCL 2 MG/2ML IJ SOLN
INTRAMUSCULAR | Status: AC
  Filled 2020-05-09: qty 2

## 2020-05-09 MED ORDER — ONDANSETRON HCL 4 MG/2ML IJ SOLN: 4.0000 mg | Freq: Once | INTRAMUSCULAR | Status: DC | PRN

## 2020-05-09 SURGICAL SUPPLY — 32 items
APL PRP STRL LF DISP 70% ISPRP (MISCELLANEOUS) ×1
BLADE SURG 15 STRL LF DISP TIS (BLADE) ×2 IMPLANT
BLADE SURG 15 STRL SS (BLADE) ×4
BNDG CMPR 9X4 STRL LF SNTH (GAUZE/BANDAGES/DRESSINGS)
BNDG COHESIVE 2X5 TAN STRL LF (GAUZE/BANDAGES/DRESSINGS) ×2 IMPLANT
BNDG ESMARK 4X9 LF (GAUZE/BANDAGES/DRESSINGS) IMPLANT
CHLORAPREP W/TINT 26 (MISCELLANEOUS) ×2 IMPLANT
CORD BIPOLAR FORCEPS 12FT (ELECTRODE) ×2 IMPLANT
COVER BACK TABLE 60X90IN (DRAPES) ×2 IMPLANT
COVER MAYO STAND STRL (DRAPES) ×2 IMPLANT
COVER WAND RF STERILE (DRAPES) IMPLANT
CUFF TOURN SGL QUICK 18X4 (TOURNIQUET CUFF) ×2 IMPLANT
DRAPE EXTREMITY T 121X128X90 (DISPOSABLE) ×2 IMPLANT
DRAPE SURG 17X23 STRL (DRAPES) ×2 IMPLANT
GAUZE SPONGE 4X4 12PLY STRL (GAUZE/BANDAGES/DRESSINGS) ×2 IMPLANT
GAUZE XEROFORM 1X8 LF (GAUZE/BANDAGES/DRESSINGS) ×2 IMPLANT
GLOVE SRG 8 PF TXTR STRL LF DI (GLOVE) ×1 IMPLANT
GLOVE SURG ENC MOIS LTX SZ7.5 (GLOVE) ×2 IMPLANT
GLOVE SURG UNDER POLY LF SZ8 (GLOVE) ×2
GOWN STRL REUS W/ TWL LRG LVL3 (GOWN DISPOSABLE) ×1 IMPLANT
GOWN STRL REUS W/TWL LRG LVL3 (GOWN DISPOSABLE) ×2
GOWN STRL REUS W/TWL XL LVL3 (GOWN DISPOSABLE) ×2 IMPLANT
NDL HYPO 25X1 1.5 SAFETY (NEEDLE) ×1 IMPLANT
NEEDLE HYPO 25X1 1.5 SAFETY (NEEDLE) ×2 IMPLANT
NS IRRIG 1000ML POUR BTL (IV SOLUTION) ×2 IMPLANT
PACK BASIN DAY SURGERY FS (CUSTOM PROCEDURE TRAY) ×2 IMPLANT
STOCKINETTE 4X48 STRL (DRAPES) ×2 IMPLANT
SUT ETHILON 4 0 PS 2 18 (SUTURE) ×2 IMPLANT
SYR BULB EAR ULCER 3OZ GRN STR (SYRINGE) ×2 IMPLANT
SYR CONTROL 10ML LL (SYRINGE) ×2 IMPLANT
TOWEL GREEN STERILE FF (TOWEL DISPOSABLE) ×4 IMPLANT
UNDERPAD 30X36 HEAVY ABSORB (UNDERPADS AND DIAPERS) ×2 IMPLANT

## 2020-05-09 NOTE — Anesthesia Procedure Notes (Signed)
Anesthesia Regional Block: Bier block (IV Regional)   Pre-Anesthetic Checklist: ,, timeout performed, Correct Patient, Correct Site, Correct Laterality, Correct Procedure,, site marked, surgical consent,, at surgeon's request  Laterality: Left     Needles:  Injection technique: Single-shot  Needle Type: Other      Needle Gauge: 20     Additional Needles:   Procedures:,,,,, intact distal pulses, Esmarch exsanguination, single tourniquet utilized,  Narrative:   Performed by: Personally       

## 2020-05-09 NOTE — Anesthesia Postprocedure Evaluation (Signed)
Anesthesia Post Note  Patient: Stephanie Brewer  Procedure(s) Performed: RELEASE TRIGGER FINGER/A-1 PULLEY LEFT LONG (Left )     Patient location during evaluation: PACU Anesthesia Type: MAC Level of consciousness: awake and alert Pain management: pain level controlled Vital Signs Assessment: post-procedure vital signs reviewed and stable Respiratory status: spontaneous breathing, nonlabored ventilation and respiratory function stable Cardiovascular status: blood pressure returned to baseline and stable Postop Assessment: no apparent nausea or vomiting Anesthetic complications: no   No complications documented.  Last Vitals:  Vitals:   05/09/20 1433 05/09/20 1435  BP: 117/70   Pulse: 83   Resp: 19   Temp: 36.6 C   SpO2: 99% 100%    Last Pain:  Vitals:   05/09/20 1445  TempSrc:   PainSc: 0-No pain                 Lidia Collum

## 2020-05-09 NOTE — Discharge Instructions (Addendum)

## 2020-05-09 NOTE — H&P (Signed)
Stephanie Brewer is an 55 y.o. female.   Chief Complaint: trigger digit HPI: 55 yo female with left long finger trigger digit.  Injected without lasting resolution.  She wishes to have trigger release.  Allergies:  Allergies  Allergen Reactions  . Hyoscyamine Anaphylaxis  . Doxycycline     Racing heart.  Marland Kitchen Nexium [Esomeprazole]     Swollen throat    Past Medical History:  Diagnosis Date  . Cancer ([place])   . DVT (deep venous thrombosis) ([facility])    left leg knee and ankle   . GERD (gastroesophageal reflux disease)     Past Surgical History:  Procedure Laterality Date  . BREAST IMPLANT REMOVAL Bilateral 01/14/2020   Procedure: REMOVAL BREAST IMPLANTS;  Surgeon: Cindra Presume, MD;  Location: [place];  Service: Plastics;  Laterality: Bilateral;  . BREAST LUMPECTOMY WITH RADIOACTIVE SEED AND SENTINEL LYMPH NODE BIOPSY Right 01/14/2020   Procedure: Right breast seed localized lumpectomy with sentinel lymph node biopsy;  Surgeon: Rolm Bookbinder, MD;  Location: [place];  Service: General;  Laterality: Right;  . BREAST SURGERY     Augmentation 2003  . HERNIA REPAIR  2002  . PORTACATH PLACEMENT N/A 09/01/2019   Procedure: INSERTION PORT-A-CATH WITH ULTRASOUND GUIDANCE;  Surgeon: Rolm Bookbinder, MD;  Location: [place];  Service: General;  Laterality: N/A;    Family History: Family History  Problem Relation Age of Onset  . Hypertension Father     Social History:   reports that she has never smoked. She has never used smokeless tobacco. She reports previous alcohol use. She reports that she does not use drugs.  Medications: Medications Prior to Admission  Medication Sig Dispense Refill  . diphenoxylate-atropine (LOMOTIL) 2.5-0.025 MG tablet 1 to 2 four times daily as needed for diarrhea 60 tablet 3  . famotidine (PEPCID) 20 MG tablet Take 1 tablet (20 mg total) by mouth 2 (two) times daily. 60 tablet 0  . lidocaine  (XYLOCAINE) 2 % solution SWISH & SPIT 1WTEASPOONFUL 4 TIMES A DAYTAS NEEDED FOR MOUTH PAIN (Patient not taking: Reported on 04/20/2020)    . lidocaine-prilocaine (EMLA) cream Apply 1 application topically as needed.    . Minocycline HCl 90 MG TB24 Take 1 tablet by mouth daily. Only takes prn for break outs      Results for orders placed or performed during the hospital encounter of 05/09/20 (from the past 48 hour(s))  Pregnancy, urine POC     Status: None   Collection Time: 05/09/20 12:42 PM  Result Value Ref Range   Preg Test, Ur NEGATIVE NEGATIVE    Comment:        THE SENSITIVITY OF THIS METHODOLOGY IS >24 mIU/mL     No results found.   A comprehensive review of systems was negative.  Height 5\' 4"  (1.626 m), weight 79.4 kg, last menstrual period 08/01/2019.  General appearance: alert, cooperative and appears stated age Head: Normocephalic, without obvious abnormality, atraumatic Neck: supple, symmetrical, trachea midline Cardio: regular rate and rhythm Resp: clear to auscultation bilaterally Extremities: Intact sensation and capillary refill all digits.  +epl/fpl/io.  No wounds.  Pulses: 2+ and symmetric Skin: Skin color, texture, turgor normal. No rashes or lesions Neurologic: Grossly normal Incision/Wound: none  Assessment/Plan Left long finger trigger digit.  Non operative and operative treatment options have been discussed with the patient and patient wishes to proceed with operative treatment. Risks, benefits, and alternatives of surgery have been discussed and the patient  agrees with the plan of care.   Stephanie Brewer 05/09/2020, 12:50 PM

## 2020-05-09 NOTE — Anesthesia Preprocedure Evaluation (Signed)
Anesthesia Evaluation  Patient identified by MRN, date of birth, ID band Patient awake    Reviewed: Allergy & Precautions, NPO status , Patient's Chart, lab work & pertinent test results  History of Anesthesia Complications Negative for: history of anesthetic complications  Airway Mallampati: II  TM Distance: >3 FB Neck ROM: Full    Dental   Pulmonary neg pulmonary ROS,    Pulmonary exam normal        Cardiovascular + DVT  Normal cardiovascular exam     Neuro/Psych negative neurological ROS  negative psych ROS   GI/Hepatic Neg liver ROS, GERD  ,  Endo/Other  negative endocrine ROS  Renal/GU negative Renal ROS  negative genitourinary   Musculoskeletal negative musculoskeletal ROS (+)   Abdominal   Peds  Hematology negative hematology ROS (+)   Anesthesia Other Findings  Breast cancer  Reproductive/Obstetrics                            Anesthesia Physical Anesthesia Plan  ASA: II  Anesthesia Plan: MAC and Bier Block and Bier Block-LIDOCAINE ONLY   Post-op Pain Management:    Induction: Intravenous  PONV Risk Score and Plan: 2 and Propofol infusion, TIVA and Treatment may vary due to age or medical condition  Airway Management Planned: Natural Airway, Nasal Cannula and Simple Face Mask  Additional Equipment: None  Intra-op Plan:   Post-operative Plan:   Informed Consent: I have reviewed the patients History and Physical, chart, labs and discussed the procedure including the risks, benefits and alternatives for the proposed anesthesia with the patient or authorized representative who has indicated his/her understanding and acceptance.       Plan Discussed with:   Anesthesia Plan Comments:         Anesthesia Quick Evaluation

## 2020-05-09 NOTE — Op Note (Signed)
05/09/2020 [facility]  Operative Note  PREOPERATIVE DIAGNOSIS: LEFT LONG FINGER TRIGGER DIGIT  POSTOPERATIVE DIAGNOSIS:  LEFT LONG FINGER TRIGGER DIGIT  PROCEDURE: Procedure(s): RELEASE TRIGGER FINGER/A-1 PULLEY LEFT LONG long finger  SURGEON:  Leanora Cover, MD  ASSISTANT:  none.  ANESTHESIA:  Bier block with sedation.  IV FLUIDS:  Per anesthesia flow sheet.  ESTIMATED BLOOD LOSS:  Minimal.  COMPLICATIONS:  None.  SPECIMENS:  None.  TOURNIQUET TIME:  Total Tourniquet Time Documented: Upper Arm (Left) - 17 minutes Total: Upper Arm (Left) - 17 minutes   DISPOSITION:  Stable to PACU.  LOCATION: [facility]  INDICATIONS: Stephanie Brewer is a 55 y.o. female with triggering left long finger.  This has been injected without lasting resolution.  She wishes to have a trigger release.  Risks, benefits and alternatives of surgery were discussed including the risk of blood loss, infection, damage to nerves, vessels, tendons, ligaments, bone, failure of surgery, need for additional surgery, complications with wound healing, continued pain, continued triggering and need for repeat surgery.  She voiced understanding of these risks and elected to proceed.  OPERATIVE COURSE:  After being identified preoperatively by myself, the patient and I agreed upon the procedure and site of procedure.  The surgical site was marked. Surgical consent had been signed. She was given IV Ancef as preoperative antibiotic prophylaxis. She was transported to the operating room and placed on the operating room table in supine position with the Left upper extremity on an arm board. Bier block anesthesia was induced by the anesthesiologist.  The Left upper extremity was prepped and draped in normal sterile orthopedic fashion. A surgical pause was performed between surgeons, anesthesia, and operating room staff, and all were in agreement as to the patient, procedure, and site of procedure.   Tourniquet at the proximal aspect of the forearm had been inflated for the Bier block.  An incision was made at the volar aspect of the MP joint of the long finger.  This was carried into the subcutaneous tissues by preading technique.  Bipolar electrocautery was used to obtain hemostasis.  The radial and ulnar digital nerves were protected throughout the case. The flexor sheath was identified.  The A1 pulley was identified and sharply incised.  It was released in its entirety.  The proximal 1-2 mm of the A2 pulley was vented to allow better excursion of the tendons.  The finger was placed through a range of motion and there was noted to be no catching.  The tendons were brought through the wound and any adherences released.  The wound was then copiously irrigated with sterile saline. It was closed with 4-0 nylon in a horizontal mattress fashion.  It was injected with 0.25% plain Marcaine to aid in postoperative analgesia.  It was dressed with sterile Xeroform, 4x4s, and wrapped lightly with a Coban dressing.  Tourniquet was deflated at 17 minutes.  The fingertips were pink with brisk capillary refill after deflation of the tourniquet.  The operative drapes were broken down and the patient was awoken from anesthesia safely.  She was transferred back to the stretcher and taken to the PACU in stable condition.   I will see her back in the office in 1 week for postoperative followup.  I will give her a prescription for Norco 5/325 1-2 tabs PO q6 hours prn pain, dispense # 12.    Leanora Cover, MD Electronically signed, 05/09/20

## 2020-05-09 NOTE — Transfer of Care (Signed)
Immediate Anesthesia Transfer of Care Note  Patient: Stephanie Brewer  Procedure(s) Performed: RELEASE TRIGGER FINGER/A-1 PULLEY LEFT LONG (Left )  Patient Location: PACU  Anesthesia Type:Bier block  Level of Consciousness: awake, alert , oriented and patient cooperative  Airway & Oxygen Therapy: Patient Spontanous Breathing and Patient connected to face mask oxygen  Post-op Assessment: Report given to RN and Post -op Vital signs reviewed and stable  Post vital signs: Reviewed and stable  Last Vitals:  Vitals Value Taken Time  BP 117/70 05/09/20 1433  Temp 36.6 C 05/09/20 1433  Pulse 81 05/09/20 1438  Resp 19 05/09/20 1438  SpO2 97 % 05/09/20 1438  Vitals shown include unvalidated device data.  Last Pain:  Vitals:   05/09/20 1433  TempSrc:   PainSc: 0-No pain         Complications: No complications documented.

## 2020-05-09 NOTE — Anesthesia Procedure Notes (Signed)
Procedure Name: MAC Date/Time: 05/09/2020 2:16 PM Performed by: Signe Colt, CRNA Pre-anesthesia Checklist: Patient identified, Emergency Drugs available, Suction available, Patient being monitored and Timeout performed Patient Re-evaluated:Patient Re-evaluated prior to induction Oxygen Delivery Method: Simple face mask

## 2020-05-10 ENCOUNTER — Encounter (HOSPITAL_BASED_OUTPATIENT_CLINIC_OR_DEPARTMENT_OTHER): Payer: Self-pay | Admitting: Orthopedic Surgery

## 2020-05-11 ENCOUNTER — Inpatient Hospital Stay: Payer: BC Managed Care – PPO | Attending: Hematology and Oncology

## 2020-05-11 ENCOUNTER — Other Ambulatory Visit: Payer: Self-pay

## 2020-05-11 VITALS — BP 128/85 | HR 76 | Temp 98.1°F | Resp 18 | Wt 173.2 lb

## 2020-05-11 DIAGNOSIS — Z170 Estrogen receptor positive status [ER+]: Secondary | ICD-10-CM | POA: Diagnosis not present

## 2020-05-11 DIAGNOSIS — C50411 Malignant neoplasm of upper-outer quadrant of right female breast: Secondary | ICD-10-CM

## 2020-05-11 DIAGNOSIS — Z5112 Encounter for antineoplastic immunotherapy: Secondary | ICD-10-CM | POA: Insufficient documentation

## 2020-05-11 MED ORDER — DIPHENHYDRAMINE HCL 25 MG PO CAPS
ORAL_CAPSULE | ORAL | Status: AC
  Filled 2020-05-11: qty 1

## 2020-05-11 MED ORDER — SODIUM CHLORIDE 0.9 % IV SOLN
Freq: Once | INTRAVENOUS | Status: AC
  Filled 2020-05-11: qty 250

## 2020-05-11 MED ORDER — SODIUM CHLORIDE 0.9 % IV SOLN
450.0000 mg | Freq: Once | INTRAVENOUS | Status: AC
  Administered 2020-05-11: 450 mg via INTRAVENOUS
  Filled 2020-05-11: qty 21.43

## 2020-05-11 MED ORDER — ACETAMINOPHEN 325 MG PO TABS
ORAL_TABLET | ORAL | Status: AC
  Filled 2020-05-11: qty 2

## 2020-05-11 MED ORDER — ACETAMINOPHEN 325 MG PO TABS
650.0000 mg | ORAL_TABLET | Freq: Once | ORAL | Status: AC
  Administered 2020-05-11: 650 mg via ORAL

## 2020-05-11 MED ORDER — SODIUM CHLORIDE 0.9% FLUSH
10.0000 mL | INTRAVENOUS | Status: DC | PRN
  Administered 2020-05-11: 10 mL
  Filled 2020-05-11: qty 10

## 2020-05-11 MED ORDER — HEPARIN SOD (PORK) LOCK FLUSH 100 UNIT/ML IV SOLN
500.0000 [IU] | Freq: Once | INTRAVENOUS | Status: AC | PRN
  Administered 2020-05-11: 500 [IU]
  Filled 2020-05-11: qty 5

## 2020-05-11 MED ORDER — DIPHENHYDRAMINE HCL 25 MG PO CAPS
25.0000 mg | ORAL_CAPSULE | Freq: Once | ORAL | Status: AC
  Administered 2020-05-11: 25 mg via ORAL

## 2020-05-11 NOTE — Patient Instructions (Signed)
[facility] Discharge Instructions for Patients Receiving Chemotherapy  Today you received the following chemotherapy agents trastuzumab.  To help prevent nausea and vomiting after your treatment, we encourage you to take your nausea medication as directed.    If you develop nausea and vomiting that is not controlled by your nausea medication, call the clinic.   BELOW ARE SYMPTOMS THAT SHOULD BE REPORTED IMMEDIATELY:  *FEVER GREATER THAN 100.5 F  *CHILLS WITH OR WITHOUT FEVER  NAUSEA AND VOMITING THAT IS NOT CONTROLLED WITH YOUR NAUSEA MEDICATION  *UNUSUAL SHORTNESS OF BREATH  *UNUSUAL BRUISING OR BLEEDING  TENDERNESS IN MOUTH AND THROAT WITH OR WITHOUT PRESENCE OF ULCERS  *URINARY PROBLEMS  *BOWEL PROBLEMS  UNUSUAL RASH Items with * indicate a potential emergency and should be followed up as soon as possible.  Feel free to call the clinic should you have any questions or concerns. The clinic phone number is [phone].  Please show the CHEMO ALERT CARD at check-in to the Emergency Department and triage nurse.   

## 2020-05-27 ENCOUNTER — Other Ambulatory Visit (HOSPITAL_COMMUNITY): Payer: BC Managed Care – PPO

## 2020-05-30 ENCOUNTER — Ambulatory Visit: Payer: BC Managed Care – PPO | Attending: General Surgery

## 2020-05-30 ENCOUNTER — Other Ambulatory Visit: Payer: Self-pay

## 2020-05-30 DIAGNOSIS — C50411 Malignant neoplasm of upper-outer quadrant of right female breast: Secondary | ICD-10-CM | POA: Insufficient documentation

## 2020-05-30 DIAGNOSIS — Z170 Estrogen receptor positive status [ER+]: Secondary | ICD-10-CM

## 2020-05-30 NOTE — Therapy (Signed)
[place], [place], [address] Phone: [phone]   Fax:  [phone]  Physical Therapy Treatment  Patient Details  Name: Stephanie Brewer MRN: [id number] Date of Birth: [birth year] Referring Provider (PT): Lindi Adie   Encounter Date: 05/30/2020   PT End of Session - 05/30/20 1652    Visit Number 2   # unchanged due to screen only   Number of Visits 2    Date for PT Re-Evaluation 01/14/20    PT Start Time 1642    PT Stop Time 1654    PT Time Calculation (min) 12 min    Activity Tolerance Patient tolerated treatment well    Behavior During Therapy [facility] for tasks assessed/performed           Past Medical History:  Diagnosis Date  . Cancer ([place])   . DVT (deep venous thrombosis) ([facility])    left leg knee and ankle   . GERD (gastroesophageal reflux disease)     Past Surgical History:  Procedure Laterality Date  . BREAST IMPLANT REMOVAL Bilateral 01/14/2020   Procedure: REMOVAL BREAST IMPLANTS;  Surgeon: Cindra Presume, MD;  Location: [place];  Service: Plastics;  Laterality: Bilateral;  . BREAST LUMPECTOMY WITH RADIOACTIVE SEED AND SENTINEL LYMPH NODE BIOPSY Right 01/14/2020   Procedure: Right breast seed localized lumpectomy with sentinel lymph node biopsy;  Surgeon: Rolm Bookbinder, MD;  Location: [place];  Service: General;  Laterality: Right;  . BREAST SURGERY     Augmentation 2003  . HERNIA REPAIR  2002  . PORTACATH PLACEMENT N/A 09/01/2019   Procedure: INSERTION PORT-A-CATH WITH ULTRASOUND GUIDANCE;  Surgeon: Rolm Bookbinder, MD;  Location: [place];  Service: General;  Laterality: N/A;  . TRIGGER FINGER RELEASE Left 05/09/2020   Procedure: RELEASE TRIGGER FINGER/A-1 PULLEY LEFT LONG;  Surgeon: Leanora Cover, MD;  Location: [place];  Service: Orthopedics;  Laterality: Left;    There were no vitals filed for this visit.    Subjective Assessment - 05/30/20 1645    Subjective Pt returns for 3 month L-Dex screen.    Pertinent History R breast cancer, grade 2 invasive ductal carcinoma, ER +, PR-, HER2+, beginning chemo next week 09/02/19, 01/15/20- R lumpectomy and SLNB (4 nodes all negative), will begin radiation 02/15/20,  hx of LLE DVT following foot surgery for bunion                  L-DEX FLOWSHEETS - 05/30/20 1600      L-DEX LYMPHEDEMA SCREENING   Measurement Type Unilateral    L-DEX MEASUREMENT EXTREMITY Upper Extremity    POSITION  Standing    DOMINANT SIDE Left    At Risk Side Right    BASELINE SCORE (UNILATERAL) 1.4    L-DEX SCORE (UNILATERAL) 3.9    VALUE CHANGE (UNILAT) 2.5                                  PT Long Term Goals - 02/03/20 1448      PT LONG TERM GOAL #1   Title Pt will demonstrate she has regained full shoulder ROM and function post operatively compared to baselines.    Time 8    Period Weeks    Status Achieved                 Plan - 05/30/20 1655  Clinical Impression Statement Pt returns for her 3 month L-dex screen. Her change from baseline of 2.5 is WNLs so no further treatment is required at this time except to cont every 3 month L-Dex screens which pt is agreeable to.    PT Next Visit Plan Cont every 3 month L-Dex screens for up to 2 years from her SLNB.    Consulted and Agree with Plan of Care Patient           Patient will benefit from skilled therapeutic intervention in order to improve the following deficits and impairments:     Visit Diagnosis: Malignant neoplasm of upper-outer quadrant of right breast in female, estrogen receptor positive ([place])     Problem List Patient Active Problem List   Diagnosis Date Noted  . Port-A-Cath in place 09/09/2019  . Malignant neoplasm of upper-outer quadrant of right breast in female, estrogen receptor positive ([place]) 08/20/2019  . Radicular leg pain 02/04/2018  . Numbness and  tingling 02/04/2018  . Foot pain, left 02/04/2018  . Displacement of breast implant 12/24/2017  . H/O bilateral breast implants 12/24/2017  . GERD (gastroesophageal reflux disease) 12/06/2015  . Gluten intolerance 12/06/2015    [name], PTA 05/30/2020, 4:56 PM  [place], [place], [address] Phone: [phone]   Fax:  [phone]  Name: Stephanie Brewer MRN: [id number] Date of Birth: 1965-11-22

## 2020-05-31 ENCOUNTER — Ambulatory Visit (HOSPITAL_COMMUNITY)
Admission: RE | Admit: 2020-05-31 | Discharge: 2020-05-31 | Disposition: A | Discharge status: Home / Self Care | Payer: BC Managed Care – PPO | Source: Ambulatory Visit | Attending: Hematology and Oncology | Admitting: Hematology and Oncology

## 2020-05-31 DIAGNOSIS — C50411 Malignant neoplasm of upper-outer quadrant of right female breast: Secondary | ICD-10-CM | POA: Diagnosis not present

## 2020-05-31 DIAGNOSIS — Z01818 Encounter for other preprocedural examination: Secondary | ICD-10-CM | POA: Diagnosis not present

## 2020-05-31 DIAGNOSIS — Z170 Estrogen receptor positive status [ER+]: Secondary | ICD-10-CM | POA: Diagnosis not present

## 2020-05-31 LAB — ECHOCARDIOGRAM COMPLETE
Area-P 1/2: 3.3 cm2
S' Lateral: 2.6 cm

## 2020-05-31 NOTE — Progress Notes (Signed)
Patient Care Team: [name], DO as PCP - General (Family Medicine) Mauro Kaufmann, RN as Oncology Nurse Navigator Rockwell Germany, RN as Oncology Nurse Navigator Gwyndolyn Kaufman, RN as Registered Nurse Gwyndolyn Kaufman, RN as Registered Nurse  DIAGNOSIS:    ICD-10-CM   1. Shortness of breath  R06.02 DG Chest 2 View  2. Malignant neoplasm of upper-outer quadrant of right breast in female, estrogen receptor positive ([place])  C50.411    Z17.0     SUMMARY OF ONCOLOGIC HISTORY: Oncology History  Malignant neoplasm of upper-outer quadrant of right breast in female, estrogen receptor positive ([place])  07/28/2019 Initial Diagnosis   Screening mammogram detected right breast mass 11:30 position subareolar 1.3 cm, indeterminate 5 mm mass: Biopsy fibroadenoma, right axillary lymph node present, biopsy of the lymph node and the mass revealed grade 2 IDC ER 10%, PR 0%, Ki-67 45%, HER-2 +3+ by [facility]   08/20/2019 Cancer Staging   Staging form: Breast, AJCC 8th Edition - Clinical stage from 08/20/2019: Stage IIA (cT2, cN1, cM0, G2, ER+, PR-, HER2+) - Signed by Eppie Gibson, MD on 01/26/2020   09/02/2019 - 12/18/2019 Chemotherapy   The patient had dexamethasone (DECADRON) 4 MG tablet, 4 mg (100 % of original dose 4 mg), Oral, Daily, 1 of 1 cycle, Start date: 08/20/2019, End date: 01/06/2020 Dose modification: 4 mg (original dose 4 mg, Cycle 0) palonosetron (ALOXI) injection 0.25 mg, 0.25 mg, Intravenous,  Once, 6 of 6 cycles Administration: 0.25 mg (09/02/2019), 0.25 mg (09/23/2019), 0.25 mg (11/25/2019), 0.25 mg (12/16/2019), 0.25 mg (10/14/2019), 0.25 mg (11/04/2019) pegfilgrastim-jmdb (FULPHILA) injection 6 mg, 6 mg, Subcutaneous,  Once, 6 of 6 cycles Administration: 6 mg (09/04/2019), 6 mg (09/25/2019), 6 mg (11/27/2019), 6 mg (12/18/2019), 6 mg (10/16/2019), 6 mg (11/06/2019) CARBOplatin (PARAPLATIN) 700 mg in sodium chloride 0.9 % 250 mL chemo infusion, 700 mg (100 % of original dose 700 mg),  Intravenous,  Once, 6 of 6 cycles Dose modification: 700 mg (original dose 700 mg, Cycle 1), 700 mg (original dose 700 mg, Cycle 5), 600 mg (original dose 700 mg, Cycle 5, Reason: Other (see comments), Comment: dose reduce to 600 mg for CINV per [name]) Administration: 700 mg (09/02/2019), 700 mg (09/23/2019), 600 mg (11/25/2019), 600 mg (12/16/2019), 700 mg (10/14/2019), 600 mg (11/04/2019) DOCEtaxel (TAXOTERE) 150 mg in sodium chloride 0.9 % 250 mL chemo infusion, 75 mg/m2 = 150 mg, Intravenous,  Once, 6 of 6 cycles Dose modification: 50 mg/m2 (original dose 75 mg/m2, Cycle 5, Reason: Dose not tolerated), 50 mg/m2 (original dose 75 mg/m2, Cycle 4, Reason: Dose not tolerated) Administration: 150 mg (09/02/2019), 150 mg (09/23/2019), 100 mg (11/25/2019), 100 mg (12/16/2019), 150 mg (10/14/2019), 100 mg (11/04/2019) fosaprepitant (EMEND) 150 mg in sodium chloride 0.9 % 145 mL IVPB, 150 mg, Intravenous,  Once, 6 of 6 cycles Administration: 150 mg (09/02/2019), 150 mg (09/23/2019), 150 mg (11/25/2019), 150 mg (12/16/2019), 150 mg (10/14/2019), 150 mg (11/04/2019) pertuzumab (PERJETA) 420 mg in sodium chloride 0.9 % 250 mL chemo infusion, 420 mg (100 % of original dose 420 mg), Intravenous, Once, 5 of 5 cycles Dose modification: 420 mg (original dose 420 mg, Cycle 1, Reason: Provider Judgment) Administration: 420 mg (09/02/2019), 420 mg (09/23/2019), 420 mg (11/25/2019), 420 mg (10/14/2019), 420 mg (11/04/2019) trastuzumab-dkst (OGIVRI) 672 mg in sodium chloride 0.9 % 250 mL chemo infusion, 8 mg/kg = 672 mg, Intravenous,  Once, 6 of 6 cycles Administration: 672 mg (09/02/2019), 504 mg (09/23/2019), 504 mg (11/25/2019), 504  mg (12/16/2019), 504 mg (10/14/2019), 504 mg (11/04/2019)  for chemotherapy treatment.    01/06/2020 -  Chemotherapy      Patient is on Antibody Plan: BREAST TRASTUZUMAB Q21D    01/14/2020 Surgery   Right lumpectomy Donne [facility]el): no evidence of residual carcinoma, 4 right axillary lymph nodes negative for  carcinoma.   02/16/2020 - 03/30/2020 Radiation Therapy   Adjuvant radiation     CHIEF COMPLIANT: Herceptin maintenance  INTERVAL HISTORY: Stephanie Brewer is a 55 y.o. with above-mentioned history of right breast cancerwho completedneoadjuvant chemotherapy,underwent a right lumpectomy, radiation, and is currently on Herceptin maintenance.Echo on 05/31/20 showed an ejection fraction of 65-70%. She presents to the clinic today for treatment.   Her major complaint is upper respiratory symptoms.  She has had a cough productive of sputum over the past weekend to the point that she thought of even going to the emergency room.  Over the past day or so her symptoms have improved.  She does not have any fevers.  ALLERGIES:  is allergic to hyoscyamine, doxycycline, and nexium [esomeprazole].  MEDICATIONS:  Current Outpatient Medications  Medication Sig Dispense Refill  . diphenoxylate-atropine (LOMOTIL) 2.5-0.025 MG tablet 1 to 2 four times daily as needed for diarrhea 60 tablet 3  . famotidine (PEPCID) 20 MG tablet Take 1 tablet (20 mg total) by mouth 2 (two) times daily. 60 tablet 0  . lidocaine-prilocaine (EMLA) cream Apply 1 application topically as needed.    . Minocycline HCl 90 MG TB24 Take 1 tablet by mouth daily. Only takes prn for break outs     No current facility-administered medications for this visit.   Facility-Administered Medications Ordered in Other Visits  Medication Dose Route Frequency Provider Last Rate Last Admin  . sodium chloride flush (NS) 0.9 % injection 10 mL  10 mL Intravenous PRN Nicholas Lose, MD   10 mL at 09/02/19 0839    PHYSICAL EXAMINATION: ECOG PERFORMANCE STATUS: 1 - Symptomatic but completely ambulatory  Vitals:   06/01/20 0829  BP: 118/72  Pulse: 73  Resp: 18  Temp: 97.7 F (36.5 C)  SpO2: 100%   Filed Weights   06/01/20 0829  Weight: 175 lb 14.4 oz (79.8 kg)     LABORATORY DATA:  I have reviewed the data as listed CMP Latest Ref Rng &  Units 06/01/2020 04/20/2020 03/04/2020  Glucose 70 - 99 mg/dL 115(H) 117(H) 99  BUN 6 - 20 mg/dL _0 Creatinine 0.44 - 1.00 mg/dL 0.72 0.79 0.68  Sodium 135 - 145 mmol/L 141 142 142  Potassium 3.5 - 5.1 mmol/L 4.1 4.0 4.0  Chloride 98 - 111 mmol/L 108 107 109  CO2 22 - 32 mmol/L _1 Calcium 8.9 - 10.3 mg/dL 8.8(L) 9.2 9.1  Total Protein 6.5 - 8.1 g/dL 6.7 6.8 6.4(L)  Total Bilirubin 0.3 - 1.2 mg/dL 0.4 0.5 0.4  Alkaline Phos 38 - 126 U/L 122 111 98  AST 15 - 41 U/L _2 ALT 0 - 44 U/L _3 Lab Results  Component Value Date   WBC 3.5 (L) 06/01/2020   HGB 11.9 (L) 06/01/2020   HCT 36.9 06/01/2020   MCV 88.1 06/01/2020   PLT 212 06/01/2020   NEUTROABS 2.0 06/01/2020    ASSESSMENT & PLAN:  Malignant neoplasm of upper-outer quadrant of right breast in female, estrogen receptor positive ([place]) /27/2021:Screening mammogram detected right breast mass 11:30 position subareolar 1.3 cm, indeterminate 5 mm mass: Biopsy  fibroadenoma, right axillary lymph node present, biopsy of the lymph node and the mass revealed grade 2 IDC ER 10%, PR 0%, Ki-67 45%, HER-2 +3+ by IHC T1 cN1 M0 stage IIa  Treatment plan: 1. Neoadjuvant chemotherapy with [place] Perjeta 6 cyclescompleted 12/16/19 (Perjeta D/Ced due to diarrhea)followed by Herceptin maintenance for 1 year 2. 01/14/2020: Right lumpectomy: Benign, no evidence of residual cancer, 0/4 lymph nodes negative, additional margins also negative, complete pathologic response (pathologic complete response) 3. Followed by adjuvant radiation therapy completed 03/30/2020  Breast MRI: 3.8 cm tumor and 1 axillary lymphnode. ---------------------------------------------------------------------------------------------------------------------------------------------------- Treatment plan:Continue with Herceptin maintenanceevery 3 weeks. Echocardiogram 02/29/2020: EF 65 to 70%  Acid reflux with a dry cough:on Pepcid.   Leukopenia: Her  WBC count has been fluctuating up and down.  There is no concern for infection risk.  Echocardiogram 05/31/20: EF 65-70 % Trigger finger surgery: Successful  Shortness of breath and cough with productive sputum: Chest x-ray will be obtained today. I will call her with the result of this test. Herceptin every 3 weeks and labs and MD visit every 6 weeks.    Orders Placed This Encounter  Procedures  . DG Chest 2 View    Standing Status:   Future    Number of Occurrences:   1    Standing Expiration Date:   06/01/2021    Order Specific Question:   Reason for exam:    Answer:   shortness of breath and cough    Order Specific Question:   Preferred imaging location?    Answer:   [facility]    Order Specific Question:   Release to patient    Answer:   Immediate   The patient has a good understanding of the overall plan. she agrees with it. she will call with any problems that may develop before the next visit here.  Total time spent: 30 mins including face to face time and time spent for planning, charting and coordination of care  Rulon Eisenmenger, MD, MPH 06/01/2020  I, Cloyde Reams Dorshimer, am acting as scribe for Dr. Nicholas Lose.  I have reviewed the above documentation for accuracy and completeness, and I agree with the above.

## 2020-05-31 NOTE — Progress Notes (Signed)
  Echocardiogram 2D Echocardiogram has been performed.  Jennette Dubin 05/31/2020, 10:58 AM

## 2020-05-31 NOTE — Assessment & Plan Note (Signed)
/  27/2021:Screening mammogram detected right breast mass 11:30 position subareolar 1.3 cm, indeterminate 5 mm mass: Biopsy fibroadenoma, right axillary lymph node present, biopsy of the lymph node and the mass revealed grade 2 IDC ER 10%, PR 0%, Ki-67 45%, HER-2 +3+ by IHC T1 cN1 M0 stage IIa  Treatment plan: 1. Neoadjuvant chemotherapy with [place] Perjeta 6 cyclescompleted 12/16/19 (Perjeta D/Ced due to diarrhea)followed by Herceptin maintenance for 1 year 2. 01/14/2020: Right lumpectomy: Benign, no evidence of residual cancer, 0/4 lymph nodes negative, additional margins also negative, complete pathologic response (pathologic complete response) 3. Followed by adjuvant radiation therapy completed 03/30/2020  Breast MRI: 3.8 cm tumor and 1 axillary lymphnode. ---------------------------------------------------------------------------------------------------------------------------------------------------- Treatment plan:Continue with Herceptin maintenanceevery 3 weeks. Echocardiogram 02/29/2020: EF 65 to 70%  Acid reflux with a dry cough:on Pepcid. She has an allergy to Nexium and therefore he did not want to use Protonix. Mild nausea probably related to gastritis Leukopenia: Her WBC count has been fluctuating up and down.  There is no concern for infection risk.  Echocardiogram 05/31/20: EF 65-70 % Trigger finger surgery planned for next week.  Herceptin every 3 weeks and labs and MD visit every 6 weeks.

## 2020-06-01 ENCOUNTER — Encounter: Payer: Self-pay | Admitting: *Deleted

## 2020-06-01 ENCOUNTER — Inpatient Hospital Stay: Payer: BC Managed Care – PPO

## 2020-06-01 ENCOUNTER — Inpatient Hospital Stay: Payer: BC Managed Care – PPO | Admitting: Hematology and Oncology

## 2020-06-01 ENCOUNTER — Ambulatory Visit (HOSPITAL_COMMUNITY)
Admission: RE | Admit: 2020-06-01 | Discharge: 2020-06-01 | Disposition: A | Discharge status: Home / Self Care | Payer: BC Managed Care – PPO | Source: Ambulatory Visit | Attending: Hematology and Oncology | Admitting: Hematology and Oncology

## 2020-06-01 ENCOUNTER — Inpatient Hospital Stay: Payer: BC Managed Care – PPO | Attending: Hematology and Oncology

## 2020-06-01 ENCOUNTER — Other Ambulatory Visit: Payer: Self-pay

## 2020-06-01 VITALS — BP 118/72 | HR 73 | Temp 97.7°F | Resp 18 | Ht 64.0 in | Wt 175.9 lb

## 2020-06-01 DIAGNOSIS — Z5112 Encounter for antineoplastic immunotherapy: Secondary | ICD-10-CM | POA: Insufficient documentation

## 2020-06-01 DIAGNOSIS — Z95828 Presence of other vascular implants and grafts: Secondary | ICD-10-CM

## 2020-06-01 DIAGNOSIS — R0602 Shortness of breath: Secondary | ICD-10-CM

## 2020-06-01 DIAGNOSIS — C50411 Malignant neoplasm of upper-outer quadrant of right female breast: Secondary | ICD-10-CM

## 2020-06-01 DIAGNOSIS — R059 Cough, unspecified: Secondary | ICD-10-CM | POA: Diagnosis not present

## 2020-06-01 DIAGNOSIS — Z170 Estrogen receptor positive status [ER+]: Secondary | ICD-10-CM

## 2020-06-01 LAB — CMP (CANCER CENTER ONLY)
ALT: 18 U/L (ref 0–44)
AST: 22 U/L (ref 15–41)
Albumin: 3.4 g/dL — ABNORMAL LOW (ref 3.5–5.0)
Alkaline Phosphatase: 122 U/L (ref 38–126)
Anion gap: 8 (ref 5–15)
BUN: 9 mg/dL (ref 6–20)
CO2: 25 mmol/L (ref 22–32)
Calcium: 8.8 mg/dL — ABNORMAL LOW (ref 8.9–10.3)
Chloride: 108 mmol/L (ref 98–111)
Creatinine: 0.72 mg/dL (ref 0.44–1.00)
GFR, Estimated: >60 mL/min (ref >60)
Glucose, Bld: 115 mg/dL — ABNORMAL HIGH (ref 70–99)
Potassium: 4.1 mmol/L (ref 3.5–5.1)
Sodium: 141 mmol/L (ref 135–145)
Total Bilirubin: 0.4 mg/dL (ref 0.3–1.2)
Total Protein: 6.7 g/dL (ref 6.5–8.1)

## 2020-06-01 LAB — CBC WITH DIFFERENTIAL (CANCER CENTER ONLY)
Abs Immature Granulocytes: 0.02 10*3/uL (ref 0.00–0.07)
Basophils Absolute: 0.1 10*3/uL (ref 0.0–0.1)
Basophils Relative: 1 %
Eosinophils Absolute: 0.3 10*3/uL (ref 0.0–0.5)
Eosinophils Relative: 7 %
HCT: 36.9 % (ref 36.0–46.0)
Hemoglobin: 11.9 g/dL — ABNORMAL LOW (ref 12.0–15.0)
Immature Granulocytes: 1 %
Lymphocytes Relative: 21 %
Lymphs Abs: 0.7 10*3/uL (ref 0.7–4.0)
MCH: 28.4 pg (ref 26.0–34.0)
MCHC: 32.2 g/dL (ref 30.0–36.0)
MCV: 88.1 fL (ref 80.0–100.0)
Monocytes Absolute: 0.5 10*3/uL (ref 0.1–1.0)
Monocytes Relative: 13 %
Neutro Abs: 2 10*3/uL (ref 1.7–7.7)
Neutrophils Relative %: 57 %
Platelet Count: 212 10*3/uL (ref 150–400)
RBC: 4.19 MIL/uL (ref 3.87–5.11)
RDW: 13.7 % (ref 11.5–15.5)
WBC Count: 3.5 10*3/uL — ABNORMAL LOW (ref 4.0–10.5)
nRBC: 0 % (ref 0.0–0.2)

## 2020-06-01 MED ORDER — HEPARIN SOD (PORK) LOCK FLUSH 100 UNIT/ML IV SOLN
500.0000 [IU] | Freq: Once | INTRAVENOUS | Status: AC | PRN
  Administered 2020-06-01: 500 [IU]
  Filled 2020-06-01: qty 5

## 2020-06-01 MED ORDER — DIPHENHYDRAMINE HCL 25 MG PO CAPS
ORAL_CAPSULE | ORAL | Status: AC
  Filled 2020-06-01: qty 2

## 2020-06-01 MED ORDER — SODIUM CHLORIDE 0.9 % IV SOLN
Freq: Once | INTRAVENOUS | Status: AC
  Filled 2020-06-01: qty 250

## 2020-06-01 MED ORDER — DIPHENHYDRAMINE HCL 25 MG PO CAPS
25.0000 mg | ORAL_CAPSULE | Freq: Once | ORAL | Status: AC
  Administered 2020-06-01: 25 mg via ORAL

## 2020-06-01 MED ORDER — ACETAMINOPHEN 325 MG PO TABS
650.0000 mg | ORAL_TABLET | Freq: Once | ORAL | Status: AC
  Administered 2020-06-01: 650 mg via ORAL

## 2020-06-01 MED ORDER — SODIUM CHLORIDE 0.9% FLUSH
10.0000 mL | INTRAVENOUS | Status: DC | PRN
  Administered 2020-06-01: 10 mL
  Filled 2020-06-01: qty 10

## 2020-06-01 MED ORDER — ACETAMINOPHEN 325 MG PO TABS
ORAL_TABLET | ORAL | Status: AC
  Filled 2020-06-01: qty 2

## 2020-06-01 MED ORDER — SODIUM CHLORIDE 0.9% FLUSH
10.0000 mL | Freq: Once | INTRAVENOUS | Status: AC
Start: 2020-06-01 — End: 2020-06-01
  Administered 2020-06-01: 10 mL
  Filled 2020-06-01: qty 10

## 2020-06-01 MED ORDER — TRASTUZUMAB-DKST CHEMO 150 MG IV SOLR
450.0000 mg | Freq: Once | INTRAVENOUS | Status: AC
  Administered 2020-06-01: 450 mg via INTRAVENOUS
  Filled 2020-06-01: qty 21.43

## 2020-06-01 NOTE — Patient Instructions (Signed)
[facility] Discharge Instructions for Patients Receiving Chemotherapy  Today you received the following chemotherapy agents trastuzumab.  To help prevent nausea and vomiting after your treatment, we encourage you to take your nausea medication as directed.    If you develop nausea and vomiting that is not controlled by your nausea medication, call the clinic.   BELOW ARE SYMPTOMS THAT SHOULD BE REPORTED IMMEDIATELY:  *FEVER GREATER THAN 100.5 F  *CHILLS WITH OR WITHOUT FEVER  NAUSEA AND VOMITING THAT IS NOT CONTROLLED WITH YOUR NAUSEA MEDICATION  *UNUSUAL SHORTNESS OF BREATH  *UNUSUAL BRUISING OR BLEEDING  TENDERNESS IN MOUTH AND THROAT WITH OR WITHOUT PRESENCE OF ULCERS  *URINARY PROBLEMS  *BOWEL PROBLEMS  UNUSUAL RASH Items with * indicate a potential emergency and should be followed up as soon as possible.  Feel free to call the clinic should you have any questions or concerns. The clinic phone number is [phone].  Please show the CHEMO ALERT CARD at check-in to the Emergency Department and triage nurse.   

## 2020-06-01 NOTE — Patient Instructions (Signed)

## 2020-06-02 ENCOUNTER — Telehealth: Payer: Self-pay | Admitting: Hematology and Oncology

## 2020-06-02 NOTE — Telephone Encounter (Signed)
Scheduled per 3/2 los. Pt will receive an updated appt calendar per next visit appt notes

## 2020-06-22 ENCOUNTER — Other Ambulatory Visit: Payer: Self-pay

## 2020-06-22 ENCOUNTER — Ambulatory Visit (HOSPITAL_BASED_OUTPATIENT_CLINIC_OR_DEPARTMENT_OTHER): Payer: BC Managed Care – PPO | Admitting: Medical

## 2020-06-22 ENCOUNTER — Inpatient Hospital Stay: Payer: BC Managed Care – PPO

## 2020-06-22 DIAGNOSIS — C50411 Malignant neoplasm of upper-outer quadrant of right female breast: Secondary | ICD-10-CM | POA: Diagnosis not present

## 2020-06-22 DIAGNOSIS — Z170 Estrogen receptor positive status [ER+]: Secondary | ICD-10-CM | POA: Diagnosis not present

## 2020-06-22 DIAGNOSIS — Z5112 Encounter for antineoplastic immunotherapy: Secondary | ICD-10-CM | POA: Diagnosis not present

## 2020-06-22 DIAGNOSIS — N644 Mastodynia: Secondary | ICD-10-CM

## 2020-06-22 MED ORDER — ACETAMINOPHEN 325 MG PO TABS
ORAL_TABLET | ORAL | Status: AC
  Filled 2020-06-22: qty 2

## 2020-06-22 MED ORDER — HEPARIN SOD (PORK) LOCK FLUSH 100 UNIT/ML IV SOLN
500.0000 [IU] | Freq: Once | INTRAVENOUS | Status: AC | PRN
  Administered 2020-06-22: 500 [IU]
  Filled 2020-06-22: qty 5

## 2020-06-22 MED ORDER — DIPHENHYDRAMINE HCL 25 MG PO CAPS
ORAL_CAPSULE | ORAL | Status: AC
  Filled 2020-06-22: qty 1

## 2020-06-22 MED ORDER — ACETAMINOPHEN 325 MG PO TABS
650.0000 mg | ORAL_TABLET | Freq: Once | ORAL | Status: AC
  Administered 2020-06-22: 650 mg via ORAL

## 2020-06-22 MED ORDER — SODIUM CHLORIDE 0.9 % IV SOLN
Freq: Once | INTRAVENOUS | Status: AC
  Filled 2020-06-22: qty 250

## 2020-06-22 MED ORDER — DIPHENHYDRAMINE HCL 25 MG PO CAPS
25.0000 mg | ORAL_CAPSULE | Freq: Once | ORAL | Status: AC
  Administered 2020-06-22: 25 mg via ORAL

## 2020-06-22 MED ORDER — TRASTUZUMAB-DKST CHEMO 150 MG IV SOLR
450.0000 mg | Freq: Once | INTRAVENOUS | Status: AC
  Administered 2020-06-22: 450 mg via INTRAVENOUS
  Filled 2020-06-22: qty 21.43

## 2020-06-22 MED ORDER — SODIUM CHLORIDE 0.9% FLUSH
10.0000 mL | INTRAVENOUS | Status: DC | PRN
  Administered 2020-06-22: 10 mL
  Filled 2020-06-22: qty 10

## 2020-06-22 NOTE — Patient Instructions (Signed)
[facility] Discharge Instructions for Patients Receiving Chemotherapy  Today you received the following chemotherapy agents trastuzumab.  To help prevent nausea and vomiting after your treatment, we encourage you to take your nausea medication as directed.    If you develop nausea and vomiting that is not controlled by your nausea medication, call the clinic.   BELOW ARE SYMPTOMS THAT SHOULD BE REPORTED IMMEDIATELY:  *FEVER GREATER THAN 100.5 F  *CHILLS WITH OR WITHOUT FEVER  NAUSEA AND VOMITING THAT IS NOT CONTROLLED WITH YOUR NAUSEA MEDICATION  *UNUSUAL SHORTNESS OF BREATH  *UNUSUAL BRUISING OR BLEEDING  TENDERNESS IN MOUTH AND THROAT WITH OR WITHOUT PRESENCE OF ULCERS  *URINARY PROBLEMS  *BOWEL PROBLEMS  UNUSUAL RASH Items with * indicate a potential emergency and should be followed up as soon as possible.  Feel free to call the clinic should you have any questions or concerns. The clinic phone number is [phone].  Please show the CHEMO ALERT CARD at check-in to the Emergency Department and triage nurse.   

## 2020-06-23 DIAGNOSIS — R29898 Other symptoms and signs involving the musculoskeletal system: Secondary | ICD-10-CM | POA: Diagnosis not present

## 2020-06-23 DIAGNOSIS — M25642 Stiffness of left hand, not elsewhere classified: Secondary | ICD-10-CM | POA: Diagnosis not present

## 2020-06-23 DIAGNOSIS — M79645 Pain in left finger(s): Secondary | ICD-10-CM | POA: Diagnosis not present

## 2020-06-23 DIAGNOSIS — M65332 Trigger finger, left middle finger: Secondary | ICD-10-CM | POA: Diagnosis not present

## 2020-06-23 NOTE — Progress Notes (Signed)
Stephanie Brewer was seen in the infusion room today.  She reported that she had noted some tenderness in her right lateral breast and right superior lateral breast.  She had a lumpectomy completed back in October.  She denies any change in activity.  She had a chest x-ray completed on 06/01/2020 with results as follows:  FINDINGS: The heart size and mediastinal contours are within normal limits. Left-sided power port remains in appropriate position.  New infiltrate is seen in the right upper and middle lobes since prior exam. This could be due to pneumonia or post radiation changes if patient is undergoing radiation therapy. No evidence of pleural effusion. Surgical clips are noted in the right breast and right axilla.  IMPRESSION: New right upper and middle lobe infiltrates, suspicious for pneumonia or post radiation changes if patient is undergoing radiation therapy. Recommend chest radiographic follow-up or chest CT.  Examination of Stephanie Brewer' right breast showed tenderness along the right lateral breast with out any masses or nodularity noted.   Plan: 1) Begin trial of Motrin 400 mg 3 times daily with food.  Continue this for 2 weeks. 2) Plans would be for a right rib film if the patient's pain continues or worsens during their trial of Motrin or should it recur after a trial of Motrin has been completed.   Sandi Mealy, MHS, PA-C Physician Assistant

## 2020-06-28 NOTE — Progress Notes (Deleted)
  Patient Name: Stephanie Brewer MRN: [id number] DOB: [birth year] Referring Physician: Nicholas Lose (Profile Not Attached) Date of Service:03/30/2020 [facility], [place]                                                        End Of Treatment Note  Diagnoses: C50.411-Malignant neoplasm of upper-outer quadrant of right female breast  Cancer Staging: Cancer Staging Malignant neoplasm of upper-outer quadrant of right breast in female, estrogen receptor positive ([place]) Staging form: Breast, AJCC 8th Edition - Clinical stage from 08/20/2019: Stage IIA (cT2, cN1, cM0, G2, ER+, PR-, HER2+) - Signed by Eppie Gibson, MD on 01/26/2020 Stage prefix: Initial diagnosis Histologic grading system: 3 grade system  ypT0,ypN0  Intent: Curative  Radiation Treatment Dates: 02/15/2020 through 03/30/2020 Site Technique Total Dose (Gy) Dose per Fx (Gy) Completed Fx Beam Energies  Breast, Right: Breast_Rt_IM_LN 3D 50/50 2 25/25 6X, 10X  Breast, Right: Breast_Rt_PAB_SCV 3D 50/50 2 25/25 6X, 10X  Breast, Right: Breast_Rt_Bst 3D 10/10 2 5/5 6X   Narrative: The patient tolerated radiation therapy relatively well.   Plan: The patient will follow-up with radiation oncology in 41mo, or as needed. -----------------------------------  Eppie Gibson, MD

## 2020-07-01 DIAGNOSIS — M65332 Trigger finger, left middle finger: Secondary | ICD-10-CM | POA: Diagnosis not present

## 2020-07-01 DIAGNOSIS — M25642 Stiffness of left hand, not elsewhere classified: Secondary | ICD-10-CM | POA: Diagnosis not present

## 2020-07-01 DIAGNOSIS — R29898 Other symptoms and signs involving the musculoskeletal system: Secondary | ICD-10-CM | POA: Diagnosis not present

## 2020-07-01 DIAGNOSIS — M79645 Pain in left finger(s): Secondary | ICD-10-CM | POA: Diagnosis not present

## 2020-07-08 DIAGNOSIS — M65332 Trigger finger, left middle finger: Secondary | ICD-10-CM | POA: Diagnosis not present

## 2020-07-11 ENCOUNTER — Other Ambulatory Visit: Payer: Self-pay

## 2020-07-11 DIAGNOSIS — C50411 Malignant neoplasm of upper-outer quadrant of right female breast: Secondary | ICD-10-CM

## 2020-07-12 NOTE — Assessment & Plan Note (Signed)
/  27/2021:Screening mammogram detected right breast mass 11:30 position subareolar 1.3 cm, indeterminate 5 mm mass: Biopsy fibroadenoma, right axillary lymph node present, biopsy of the lymph node and the mass revealed grade 2 IDC ER 10%, PR 0%, Ki-67 45%, HER-2 +3+ by IHC T1 cN1 M0 stage IIa  Treatment plan: 1. Neoadjuvant chemotherapy with Sherman Perjeta 6 cyclescompleted 12/16/19 (Perjeta D/Ced due to diarrhea)followed by Herceptin maintenance for 1 year 2. 01/14/2020: Right lumpectomy: Benign, no evidence of residual cancer, 0/4 lymph nodes negative, additional margins also negative, complete pathologic response(pathologic complete response) 3. Followed by adjuvant radiation therapycompleted 03/30/2020  Breast MRI: 3.8 cm tumor and 1 axillary lymphnode. ---------------------------------------------------------------------------------------------------------------------------------------------------- Treatment plan:Continue with Herceptin maintenanceevery 3 weeks. Echocardiogram 02/29/2020: EF 65 to 70%  Acid reflux with a dry cough:onPepcid.   Leukopenia: Her WBC count has been fluctuating up and down. There is no concern for infection risk.  Echocardiogram 05/31/20: EF 65-70 % Trigger finger surgery: Successful  Shortness of breath and cough with productive sputum: Chest x-ray will be obtained today. I will call her with the result of this test. Herceptin every 3 weeks and labs and MD visit every 6 weeks.

## 2020-07-12 NOTE — Progress Notes (Signed)
Patient Care Team: [name], DO as PCP - General (Family Medicine) Mauro Kaufmann, RN as Oncology Nurse Navigator Rockwell Germany, RN as Oncology Nurse Navigator Gwyndolyn Kaufman, RN as Registered Nurse Gwyndolyn Kaufman, RN as Registered Nurse  DIAGNOSIS:    ICD-10-CM   1. Malignant neoplasm of upper-outer quadrant of right breast in female, estrogen receptor positive ([place])  C50.411    Z17.0     SUMMARY OF ONCOLOGIC HISTORY: Oncology History  Malignant neoplasm of upper-outer quadrant of right breast in female, estrogen receptor positive ([place])  07/28/2019 Initial Diagnosis   Screening mammogram detected right breast mass 11:30 position subareolar 1.3 cm, indeterminate 5 mm mass: Biopsy fibroadenoma, right axillary lymph node present, biopsy of the lymph node and the mass revealed grade 2 IDC ER 10%, PR 0%, Ki-67 45%, HER-2 +3+ by [facility]   08/20/2019 Cancer Staging   Staging form: Breast, AJCC 8th Edition - Clinical stage from 08/20/2019: Stage IIA (cT2, cN1, cM0, G2, ER+, PR-, HER2+) - Signed by Eppie Gibson, MD on 01/26/2020   09/02/2019 - 12/18/2019 Chemotherapy   The patient had dexamethasone (DECADRON) 4 MG tablet, 4 mg (100 % of original dose 4 mg), Oral, Daily, 1 of 1 cycle, Start date: 08/20/2019, End date: 01/06/2020 Dose modification: 4 mg (original dose 4 mg, Cycle 0) palonosetron (ALOXI) injection 0.25 mg, 0.25 mg, Intravenous,  Once, 6 of 6 cycles Administration: 0.25 mg (09/02/2019), 0.25 mg (09/23/2019), 0.25 mg (11/25/2019), 0.25 mg (12/16/2019), 0.25 mg (10/14/2019), 0.25 mg (11/04/2019) pegfilgrastim-jmdb (FULPHILA) injection 6 mg, 6 mg, Subcutaneous,  Once, 6 of 6 cycles Administration: 6 mg (09/04/2019), 6 mg (09/25/2019), 6 mg (11/27/2019), 6 mg (12/18/2019), 6 mg (10/16/2019), 6 mg (11/06/2019) CARBOplatin (PARAPLATIN) 700 mg in sodium chloride 0.9 % 250 mL chemo infusion, 700 mg (100 % of original dose 700 mg), Intravenous,  Once, 6 of 6 cycles Dose modification: 700  mg (original dose 700 mg, Cycle 1), 700 mg (original dose 700 mg, Cycle 5), 600 mg (original dose 700 mg, Cycle 5, Reason: Other (see comments), Comment: dose reduce to 600 mg for CINV per [name]) Administration: 700 mg (09/02/2019), 700 mg (09/23/2019), 600 mg (11/25/2019), 600 mg (12/16/2019), 700 mg (10/14/2019), 600 mg (11/04/2019) DOCEtaxel (TAXOTERE) 150 mg in sodium chloride 0.9 % 250 mL chemo infusion, 75 mg/m2 = 150 mg, Intravenous,  Once, 6 of 6 cycles Dose modification: 50 mg/m2 (original dose 75 mg/m2, Cycle 5, Reason: Dose not tolerated), 50 mg/m2 (original dose 75 mg/m2, Cycle 4, Reason: Dose not tolerated) Administration: 150 mg (09/02/2019), 150 mg (09/23/2019), 100 mg (11/25/2019), 100 mg (12/16/2019), 150 mg (10/14/2019), 100 mg (11/04/2019) fosaprepitant (EMEND) 150 mg in sodium chloride 0.9 % 145 mL IVPB, 150 mg, Intravenous,  Once, 6 of 6 cycles Administration: 150 mg (09/02/2019), 150 mg (09/23/2019), 150 mg (11/25/2019), 150 mg (12/16/2019), 150 mg (10/14/2019), 150 mg (11/04/2019) pertuzumab (PERJETA) 420 mg in sodium chloride 0.9 % 250 mL chemo infusion, 420 mg (100 % of original dose 420 mg), Intravenous, Once, 5 of 5 cycles Dose modification: 420 mg (original dose 420 mg, Cycle 1, Reason: Provider Judgment) Administration: 420 mg (09/02/2019), 420 mg (09/23/2019), 420 mg (11/25/2019), 420 mg (10/14/2019), 420 mg (11/04/2019) trastuzumab-dkst (OGIVRI) 672 mg in sodium chloride 0.9 % 250 mL chemo infusion, 8 mg/kg = 672 mg, Intravenous,  Once, 6 of 6 cycles Administration: 672 mg (09/02/2019), 504 mg (09/23/2019), 504 mg (11/25/2019), 504 mg (12/16/2019), 504 mg (10/14/2019), 504 mg (11/04/2019)  for chemotherapy  treatment.    01/06/2020 -  Chemotherapy      Patient is on Antibody Plan: BREAST TRASTUZUMAB Q21D    01/14/2020 Surgery   Right lumpectomy Donne Hazel): no evidence of residual carcinoma, 4 right axillary lymph nodes negative for carcinoma.   02/16/2020 - 03/30/2020 Radiation Therapy   Adjuvant  radiation     CHIEF COMPLIANT: Herceptin maintenance  INTERVAL HISTORY: Stephanie Brewer is a 55 y.o. with above-mentioned history of right breast cancerwho completedneoadjuvant chemotherapy,underwent a right lumpectomy,radiation,and is currently on Herceptin maintenance. She presents to the clinic todayfor treatment.    ALLERGIES:  is allergic to hyoscyamine, doxycycline, and nexium [esomeprazole].  MEDICATIONS:  Current Outpatient Medications  Medication Sig Dispense Refill  . diphenoxylate-atropine (LOMOTIL) 2.5-0.025 MG tablet 1 to 2 four times daily as needed for diarrhea 60 tablet 3  . famotidine (PEPCID) 20 MG tablet Take 1 tablet (20 mg total) by mouth 2 (two) times daily. 60 tablet 0  . lidocaine-prilocaine (EMLA) cream Apply 1 application topically as needed.    . Minocycline HCl 90 MG TB24 Take 1 tablet by mouth daily. Only takes prn for break outs     No current facility-administered medications for this visit.   Facility-Administered Medications Ordered in Other Visits  Medication Dose Route Frequency Provider Last Rate Last Admin  . sodium chloride flush (NS) 0.9 % injection 10 mL  10 mL Intravenous PRN Nicholas Lose, MD   10 mL at 09/02/19 0839    PHYSICAL EXAMINATION: ECOG PERFORMANCE STATUS: 1 - Symptomatic but completely ambulatory  There were no vitals filed for this visit. There were no vitals filed for this visit.  LABORATORY DATA:  I have reviewed the data as listed CMP Latest Ref Rng & Units 06/01/2020 04/20/2020 03/04/2020  Glucose 70 - 99 mg/dL 115(H) 117(H) 99  BUN 6 - 20 mg/dL $Remove'9 10 8  'xkLmOJj$ Creatinine 0.44 - 1.00 mg/dL 0.72 0.79 0.68  Sodium 135 - 145 mmol/L 141 142 142  Potassium 3.5 - 5.1 mmol/L 4.1 4.0 4.0  Chloride 98 - 111 mmol/L 108 107 109  CO2 22 - 32 mmol/L $RemoveB'25 26 26  'cWjszuKt$ Calcium 8.9 - 10.3 mg/dL 8.8(L) 9.2 9.1  Total Protein 6.5 - 8.1 g/dL 6.7 6.8 6.4(L)  Total Bilirubin 0.3 - 1.2 mg/dL 0.4 0.5 0.4  Alkaline Phos 38 - 126 U/L 122 111 98  AST  15 - 41 U/L $Remo'22 26 25  'RryOy$ ALT 0 - 44 U/L $Remo'18 22 17    'haqYT$ Lab Results  Component Value Date   WBC 3.6 (L) 07/13/2020   HGB 12.0 07/13/2020   HCT 36.5 07/13/2020   MCV 88.0 07/13/2020   PLT 203 07/13/2020   NEUTROABS 2.1 07/13/2020    ASSESSMENT & PLAN:  Malignant neoplasm of upper-outer quadrant of right breast in female, estrogen receptor positive ([place]) /27/2021:Screening mammogram detected right breast mass 11:30 position subareolar 1.3 cm, indeterminate 5 mm mass: Biopsy fibroadenoma, right axillary lymph node present, biopsy of the lymph node and the mass revealed grade 2 IDC ER 10%, PR 0%, Ki-67 45%, HER-2 +3+ by IHC T1 cN1 M0 stage IIa  Treatment plan: 1. Neoadjuvant chemotherapy with Gordon Perjeta 6 cyclescompleted 12/16/19 (Perjeta D/Ced due to diarrhea)followed by Herceptin maintenance for 1 year 2. 01/14/2020: Right lumpectomy: Benign, no evidence of residual cancer, 0/4 lymph nodes negative, additional margins also negative, complete pathologic response(pathologic complete response) 3. Followed by adjuvant radiation therapycompleted 03/30/2020 4.  Antiestrogen therapy with anastrozole 1 mg daily x5 years starting 07/13/2020  Breast MRI: 3.8 cm tumor and 1 axillary lymphnode. ---------------------------------------------------------------------------------------------------------------------------------------------------- Treatment plan:Continue with Herceptin maintenanceevery 3 weeks. Echocardiogram 02/29/2020: EF 65 to 70%  Acid reflux with a dry cough:onPepcid.   Leukopenia: Her WBC count has been fluctuating up and down. There is no concern for infection risk.  Echocardiogram 05/31/20: EF 65-70 % Trigger finger surgery: Successful  Herceptin every 3 weeks and labs and MD visit every 6 weeks. She completes Herceptin in 6 weeks. I recommended antiestrogen therapy given the fact that she was estrogen receptor positive.  Anastrozole counseling: We discussed the  risks and benefits of anti-estrogen therapy with aromatase inhibitors. These include but not limited to insomnia, hot flashes, mood changes, vaginal dryness, bone density loss, and weight gain. We strongly believe that the benefits far outweigh the risks. Patient understands these risks and consented to starting treatment. Planned treatment duration is 5 years.  I will see her back in 6 weeks we will reassess her toxicities to anastrozole therapy.    No orders of the defined types were placed in this encounter.  The patient has a good understanding of the overall plan. she agrees with it. she will call with any problems that may develop before the next visit here.  Total time spent: 30 mins including face to face time and time spent for planning, charting and coordination of care  Rulon Eisenmenger, MD, MPH 07/13/2020  I, Molly Dorshimer, am acting as scribe for Dr. Nicholas Lose.  I have reviewed the above documentation for accuracy and completeness, and I agree with the above.

## 2020-07-13 ENCOUNTER — Other Ambulatory Visit: Payer: Self-pay

## 2020-07-13 ENCOUNTER — Inpatient Hospital Stay (HOSPITAL_BASED_OUTPATIENT_CLINIC_OR_DEPARTMENT_OTHER): Payer: BC Managed Care – PPO | Admitting: Hematology and Oncology

## 2020-07-13 ENCOUNTER — Inpatient Hospital Stay: Payer: BC Managed Care – PPO

## 2020-07-13 ENCOUNTER — Inpatient Hospital Stay: Payer: BC Managed Care – PPO | Attending: Hematology and Oncology

## 2020-07-13 DIAGNOSIS — Z95828 Presence of other vascular implants and grafts: Secondary | ICD-10-CM

## 2020-07-13 DIAGNOSIS — C50411 Malignant neoplasm of upper-outer quadrant of right female breast: Secondary | ICD-10-CM

## 2020-07-13 DIAGNOSIS — Z170 Estrogen receptor positive status [ER+]: Secondary | ICD-10-CM | POA: Diagnosis not present

## 2020-07-13 DIAGNOSIS — Z5112 Encounter for antineoplastic immunotherapy: Secondary | ICD-10-CM | POA: Insufficient documentation

## 2020-07-13 LAB — CMP (CANCER CENTER ONLY)
ALT: 13 U/L (ref 0–44)
AST: 18 U/L (ref 15–41)
Albumin: 3.4 g/dL — ABNORMAL LOW (ref 3.5–5.0)
Alkaline Phosphatase: 120 U/L (ref 38–126)
Anion gap: 10 (ref 5–15)
BUN: 9 mg/dL (ref 6–20)
CO2: 25 mmol/L (ref 22–32)
Calcium: 8.7 mg/dL — ABNORMAL LOW (ref 8.9–10.3)
Chloride: 107 mmol/L (ref 98–111)
Creatinine: 0.72 mg/dL (ref 0.44–1.00)
GFR, Estimated: >60 mL/min (ref >60)
Glucose, Bld: 102 mg/dL — ABNORMAL HIGH (ref 70–99)
Potassium: 4 mmol/L (ref 3.5–5.1)
Sodium: 142 mmol/L (ref 135–145)
Total Bilirubin: 0.5 mg/dL (ref 0.3–1.2)
Total Protein: 6.6 g/dL (ref 6.5–8.1)

## 2020-07-13 LAB — CBC WITH DIFFERENTIAL (CANCER CENTER ONLY)
Abs Immature Granulocytes: 0.01 10*3/uL (ref 0.00–0.07)
Basophils Absolute: 0 10*3/uL (ref 0.0–0.1)
Basophils Relative: 1 %
Eosinophils Absolute: 0.3 10*3/uL (ref 0.0–0.5)
Eosinophils Relative: 7 %
HCT: 36.5 % (ref 36.0–46.0)
Hemoglobin: 12 g/dL (ref 12.0–15.0)
Immature Granulocytes: 0 %
Lymphocytes Relative: 21 %
Lymphs Abs: 0.7 10*3/uL (ref 0.7–4.0)
MCH: 28.9 pg (ref 26.0–34.0)
MCHC: 32.9 g/dL (ref 30.0–36.0)
MCV: 88 fL (ref 80.0–100.0)
Monocytes Absolute: 0.4 10*3/uL (ref 0.1–1.0)
Monocytes Relative: 12 %
Neutro Abs: 2.1 10*3/uL (ref 1.7–7.7)
Neutrophils Relative %: 59 %
Platelet Count: 203 10*3/uL (ref 150–400)
RBC: 4.15 MIL/uL (ref 3.87–5.11)
RDW: 13.5 % (ref 11.5–15.5)
WBC Count: 3.6 10*3/uL — ABNORMAL LOW (ref 4.0–10.5)
nRBC: 0 % (ref 0.0–0.2)

## 2020-07-13 MED ORDER — SODIUM CHLORIDE 0.9% FLUSH
10.0000 mL | Freq: Once | INTRAVENOUS | Status: AC
  Administered 2020-07-13: 10 mL
  Filled 2020-07-13: qty 10

## 2020-07-13 MED ORDER — TRASTUZUMAB-DKST CHEMO 150 MG IV SOLR
450.0000 mg | Freq: Once | INTRAVENOUS | Status: AC
  Administered 2020-07-13: 450 mg via INTRAVENOUS
  Filled 2020-07-13: qty 21.43

## 2020-07-13 MED ORDER — ACETAMINOPHEN 325 MG PO TABS
ORAL_TABLET | ORAL | Status: AC
  Filled 2020-07-13: qty 2

## 2020-07-13 MED ORDER — SODIUM CHLORIDE 0.9% FLUSH
10.0000 mL | INTRAVENOUS | Status: DC | PRN
  Administered 2020-07-13: 10 mL
  Filled 2020-07-13: qty 10

## 2020-07-13 MED ORDER — DIPHENHYDRAMINE HCL 25 MG PO CAPS
ORAL_CAPSULE | ORAL | Status: AC
  Filled 2020-07-13: qty 1

## 2020-07-13 MED ORDER — ANASTROZOLE 1 MG PO TABS: 1.0000 mg | ORAL_TABLET | Freq: Every day | ORAL | 3 refills | Status: DC

## 2020-07-13 MED ORDER — SODIUM CHLORIDE 0.9 % IV SOLN
Freq: Once | INTRAVENOUS | Status: AC
  Filled 2020-07-13: qty 250

## 2020-07-13 MED ORDER — ACETAMINOPHEN 325 MG PO TABS
650.0000 mg | ORAL_TABLET | Freq: Once | ORAL | Status: AC
Start: 2020-07-13 — End: 2020-07-13
  Administered 2020-07-13: 650 mg via ORAL

## 2020-07-13 MED ORDER — DIPHENHYDRAMINE HCL 25 MG PO CAPS
25.0000 mg | ORAL_CAPSULE | Freq: Once | ORAL | Status: AC
  Administered 2020-07-13: 25 mg via ORAL

## 2020-07-13 MED ORDER — HEPARIN SOD (PORK) LOCK FLUSH 100 UNIT/ML IV SOLN
500.0000 [IU] | Freq: Once | INTRAVENOUS | Status: AC | PRN
  Administered 2020-07-13: 500 [IU]
  Filled 2020-07-13: qty 5

## 2020-07-15 DIAGNOSIS — M65332 Trigger finger, left middle finger: Secondary | ICD-10-CM | POA: Diagnosis not present

## 2020-07-15 DIAGNOSIS — M25642 Stiffness of left hand, not elsewhere classified: Secondary | ICD-10-CM | POA: Diagnosis not present

## 2020-07-15 DIAGNOSIS — R29898 Other symptoms and signs involving the musculoskeletal system: Secondary | ICD-10-CM | POA: Diagnosis not present

## 2020-07-15 DIAGNOSIS — M79645 Pain in left finger(s): Secondary | ICD-10-CM | POA: Diagnosis not present

## 2020-07-19 DIAGNOSIS — C50911 Malignant neoplasm of unspecified site of right female breast: Secondary | ICD-10-CM | POA: Diagnosis not present

## 2020-07-21 ENCOUNTER — Other Ambulatory Visit: Payer: Self-pay | Admitting: *Deleted

## 2020-07-21 ENCOUNTER — Telehealth: Payer: Self-pay | Admitting: *Deleted

## 2020-07-21 DIAGNOSIS — C50411 Malignant neoplasm of upper-outer quadrant of right female breast: Secondary | ICD-10-CM

## 2020-07-21 DIAGNOSIS — Z170 Estrogen receptor positive status [ER+]: Secondary | ICD-10-CM

## 2020-07-21 NOTE — Telephone Encounter (Signed)
Pt informed of fever since Tuesday 4/19.  Per pt, "At first it was low grade 99, but has been as high as 102.7, of course this was controlled by Tylenol and it is now lingering between 99-100.  I've also had some chills, slight sore throat, headache and an increased cough. I feel like I did after my first chemo treatment, which I had some of the same side effects as I'm having now. I'm working, so I don't feel bad more just tired".  Pt wondering if these symptoms could be from starting anastrozole. Informed pt these are not typical symptoms from anastrozole and questioned if pt has had a covid test as these are the typical symptoms.  Pt took 3 home positive tests after receiving recommendations. All three are reported as being positive per pt. Informed pt that referral will be placed for covid infusion center and she will receive a call with that appt. Also informed pt that herceptin on 5/4 will need to be cx.

## 2020-07-22 ENCOUNTER — Other Ambulatory Visit: Payer: Self-pay | Admitting: Physician Assistant

## 2020-07-22 ENCOUNTER — Other Ambulatory Visit: Payer: Self-pay

## 2020-07-22 ENCOUNTER — Ambulatory Visit (INDEPENDENT_AMBULATORY_CARE_PROVIDER_SITE_OTHER): Payer: BC Managed Care – PPO

## 2020-07-22 DIAGNOSIS — U071 COVID-19: Secondary | ICD-10-CM

## 2020-07-22 DIAGNOSIS — C50411 Malignant neoplasm of upper-outer quadrant of right female breast: Secondary | ICD-10-CM

## 2020-07-22 DIAGNOSIS — Z170 Estrogen receptor positive status [ER+]: Secondary | ICD-10-CM

## 2020-07-22 MED ORDER — FAMOTIDINE IN NACL 20-0.9 MG/50ML-% IV SOLN: 20.0000 mg | Freq: Once | INTRAVENOUS | Status: AC | PRN

## 2020-07-22 MED ORDER — EPINEPHRINE 0.3 MG/0.3ML IJ SOAJ: 0.3000 mg | Freq: Once | INTRAMUSCULAR | Status: AC | PRN

## 2020-07-22 MED ORDER — METHYLPREDNISOLONE SODIUM SUCC 125 MG IJ SOLR: 125.0000 mg | Freq: Once | INTRAMUSCULAR | Status: AC | PRN

## 2020-07-22 MED ORDER — ALBUTEROL SULFATE HFA 108 (90 BASE) MCG/ACT IN AERS
2.0000 | INHALATION_SPRAY | Freq: Once | RESPIRATORY_TRACT | Status: AC | PRN
Start: 2020-07-22 — End: 2020-07-22

## 2020-07-22 MED ORDER — DIPHENHYDRAMINE HCL 50 MG/ML IJ SOLN: 50.0000 mg | Freq: Once | INTRAMUSCULAR | Status: AC | PRN

## 2020-07-22 MED ORDER — SODIUM CHLORIDE 0.9 % IV SOLN
INTRAVENOUS | Status: DC | PRN
Start: 2020-07-22 — End: 2020-12-28

## 2020-07-22 MED ORDER — BEBTELOVIMAB 175 MG/2 ML IV (EUA)
175.0000 mg | Freq: Once | INTRAMUSCULAR | Status: AC
  Administered 2020-07-22: 175 mg via INTRAVENOUS

## 2020-07-22 NOTE — Progress Notes (Signed)
I connected by phone with [name] on 07/22/2020 at 9:11 AM to discuss the potential use of a new treatment for mild to moderate COVID-19 viral infection in non-hospitalized patients.  This patient is a 55 y.o. female that meets the FDA criteria for Emergency Use Authorization of COVID monoclonal antibody bebtelovimab.  Has a (+) direct SARS-CoV-2 viral test result  Has mild or moderate COVID-19   Is NOT hospitalized due to COVID-19  Is within 10 days of symptom onset  Has at least one of the high risk factor(s) for progression to severe COVID-19 and/or hospitalization as defined in EUA.  Specific high risk criteria : Immunosuppressive Disease or Treatment and Other high risk medical condition per CDC:  unvaccinated   I have spoken and communicated the following to the patient or parent/caregiver regarding COVID monoclonal antibody treatment:  1. FDA has authorized the emergency use for the treatment of mild to moderate COVID-19 in adults and pediatric patients with positive results of direct SARS-CoV-2 viral testing who are 60 years of age and older weighing at least 40 kg, and who are at high risk for progressing to severe COVID-19 and/or hospitalization.  2. The significant known and potential risks and benefits of COVID monoclonal antibody, and the extent to which such potential risks and benefits are unknown.  3. Information on available alternative treatments and the risks and benefits of those alternatives, including clinical trials.  4. Patients treated with COVID monoclonal antibody should continue to self-isolate and use infection control measures (e.g., wear mask, isolate, social distance, avoid sharing personal items, clean and disinfect "high touch" surfaces, and frequent handwashing) according to CDC guidelines.   5. The patient or parent/caregiver has the option to accept or refuse COVID monoclonal antibody treatment.  6. Discussion about the monoclonal antibody  infusion does not ensure treatment. The patient will be placed on a list and scheduled according to risk, symptom onset and availability. A scheduler will reach to the patient to let them know if we can accommodate their infusion or not.  After reviewing this information with the patient, the patient has agreed to receive one of the available covid 19 monoclonal antibodies and will be provided an appropriate fact sheet prior to infusion.    Angelena Form, PA-C 07/22/2020 9:11 AM

## 2020-07-22 NOTE — Progress Notes (Signed)
Diagnosis: COVID  Provider:  Marshell Garfinkel, MD  Procedure: Infusion  IV Type: Peripheral, IV Location: L Antecubital  Bebtelovimab, Dose: 175mg   Infusion Start Time: 8315  Infusion Stop Time: 1761  Post Infusion IV Care: Observation period completed and Peripheral IV Discontinued  Discharge: Condition: Good, Destination: Home . AVS provided to patient.   Performed by:  Koren Shiver, RN

## 2020-07-22 NOTE — Patient Instructions (Signed)
10 Things You Can Do to Manage Your COVID-19 Symptoms at Home If you have possible or confirmed COVID-19: 1. Stay home except to get medical care. 2. Monitor your symptoms carefully. If your symptoms get worse, call your healthcare provider immediately. 3. Get rest and stay hydrated. 4. If you have a medical appointment, call the healthcare provider ahead of time and tell them that you have or may have COVID-19. 5. For medical emergencies, call 911 and notify the dispatch personnel that you have or may have COVID-19. 6. Cover your cough and sneezes with a tissue or use the inside of your elbow. 7. Wash your hands often with soap and water for at least 20 seconds or clean your hands with an alcohol-based hand sanitizer that contains at least 60% alcohol. 8. As much as possible, stay in a specific room and away from other people in your home. Also, you should use a separate bathroom, if available. If you need to be around other people in or outside of the home, wear a mask. 9. Avoid sharing personal items with other people in your household, like dishes, towels, and bedding. 10. Clean all surfaces that are touched often, like counters, tabletops, and doorknobs. Use household cleaning sprays or wipes according to the label instructions. cdc.gov/coronavirus 10/16/2019 This information is not intended to replace advice given to you by your health care provider. Make sure you discuss any questions you have with your health care provider. Document Revised: 02/01/2020 Document Reviewed: 02/01/2020 Elsevier Patient Education  2021 Elsevier Inc.  What types of side effects do monoclonal antibody drugs cause?  Common side effects  In general, the more common side effects caused by monoclonal antibody drugs include: . Allergic reactions, such as hives or itching . Flu-like signs and symptoms, including chills, fatigue, fever, and muscle aches and pains . Nausea, vomiting . Diarrhea . Skin  rashes . Low blood pressure   The CDC is recommending patients who receive monoclonal antibody treatments wait at least 90 days before being vaccinated.  Currently, there are no data on the safety and efficacy of mRNA COVID-19 vaccines in persons who received monoclonal antibodies or convalescent plasma as part of COVID-19 treatment. Based on the estimated half-life of such therapies as well as evidence suggesting that reinfection is uncommon in the 90 days after initial infection, vaccination should be deferred for at least 90 days, as a precautionary measure until additional information becomes available, to avoid interference of the antibody treatment with vaccine-induced immune responses.  

## 2020-07-28 ENCOUNTER — Telehealth: Payer: Self-pay

## 2020-07-28 DIAGNOSIS — Z170 Estrogen receptor positive status [ER+]: Secondary | ICD-10-CM

## 2020-07-28 DIAGNOSIS — C50411 Malignant neoplasm of upper-outer quadrant of right female breast: Secondary | ICD-10-CM

## 2020-07-28 NOTE — Telephone Encounter (Signed)
S1714 - A PROSPECTIVE OBSERVATIONAL COHORT STUDY to DEVELOP A PREDICTIVE MODEL of TAXANE-INDUCED PERIPHERAL NEUROPATHY IN CANCER PATIENTS   VOICEMAIL MESSAGE: Outgoing call to Ainhoa advising of upcoming visit with [name] and the research team on 08/24/20. Also advised her new clinical research contact is Clabe Seal. Direct phone number for Baron Sane also provided. Kimbly Eanes for her continued participation in the S1714 study and advised she is free to call me with any concerns or questions up until my departure on 08/12/20. Current plan is for Kory to see Rosalyn for her 52-week S1714 visit on 08/24/20.   Dionne Bucy. Sharlett Iles, BSN, RN, CIC 07/28/2020 12:40 PM

## 2020-08-01 ENCOUNTER — Encounter: Payer: Self-pay | Admitting: *Deleted

## 2020-08-03 ENCOUNTER — Other Ambulatory Visit: Payer: BC Managed Care – PPO

## 2020-08-03 ENCOUNTER — Ambulatory Visit: Payer: BC Managed Care – PPO

## 2020-08-03 ENCOUNTER — Ambulatory Visit: Payer: BC Managed Care – PPO | Admitting: Hematology and Oncology

## 2020-08-03 DIAGNOSIS — D485 Neoplasm of uncertain behavior of skin: Secondary | ICD-10-CM | POA: Diagnosis not present

## 2020-08-03 DIAGNOSIS — X32XXXA Exposure to sunlight, initial encounter: Secondary | ICD-10-CM | POA: Diagnosis not present

## 2020-08-03 DIAGNOSIS — L989 Disorder of the skin and subcutaneous tissue, unspecified: Secondary | ICD-10-CM | POA: Diagnosis not present

## 2020-08-03 DIAGNOSIS — Z1283 Encounter for screening for malignant neoplasm of skin: Secondary | ICD-10-CM | POA: Diagnosis not present

## 2020-08-03 DIAGNOSIS — D225 Melanocytic nevi of trunk: Secondary | ICD-10-CM | POA: Diagnosis not present

## 2020-08-03 DIAGNOSIS — L570 Actinic keratosis: Secondary | ICD-10-CM | POA: Diagnosis not present

## 2020-08-05 DIAGNOSIS — M25642 Stiffness of left hand, not elsewhere classified: Secondary | ICD-10-CM | POA: Diagnosis not present

## 2020-08-05 DIAGNOSIS — R29898 Other symptoms and signs involving the musculoskeletal system: Secondary | ICD-10-CM | POA: Diagnosis not present

## 2020-08-05 DIAGNOSIS — M65332 Trigger finger, left middle finger: Secondary | ICD-10-CM | POA: Diagnosis not present

## 2020-08-12 ENCOUNTER — Other Ambulatory Visit: Payer: Self-pay

## 2020-08-12 ENCOUNTER — Ambulatory Visit
Admission: RE | Admit: 2020-08-12 | Discharge: 2020-08-12 | Disposition: A | Discharge status: Home / Self Care | Payer: BC Managed Care – PPO | Source: Ambulatory Visit | Attending: Hematology and Oncology | Admitting: Hematology and Oncology

## 2020-08-12 ENCOUNTER — Other Ambulatory Visit: Payer: Self-pay | Admitting: Hematology and Oncology

## 2020-08-12 DIAGNOSIS — C50411 Malignant neoplasm of upper-outer quadrant of right female breast: Secondary | ICD-10-CM

## 2020-08-12 DIAGNOSIS — M25642 Stiffness of left hand, not elsewhere classified: Secondary | ICD-10-CM | POA: Diagnosis not present

## 2020-08-12 DIAGNOSIS — R29898 Other symptoms and signs involving the musculoskeletal system: Secondary | ICD-10-CM | POA: Diagnosis not present

## 2020-08-12 DIAGNOSIS — M65332 Trigger finger, left middle finger: Secondary | ICD-10-CM | POA: Diagnosis not present

## 2020-08-12 DIAGNOSIS — Z170 Estrogen receptor positive status [ER+]: Secondary | ICD-10-CM

## 2020-08-12 DIAGNOSIS — R922 Inconclusive mammogram: Secondary | ICD-10-CM | POA: Diagnosis not present

## 2020-08-19 DIAGNOSIS — H16041 Marginal corneal ulcer, right eye: Secondary | ICD-10-CM | POA: Diagnosis not present

## 2020-08-19 DIAGNOSIS — D485 Neoplasm of uncertain behavior of skin: Secondary | ICD-10-CM | POA: Diagnosis not present

## 2020-08-19 DIAGNOSIS — L988 Other specified disorders of the skin and subcutaneous tissue: Secondary | ICD-10-CM | POA: Diagnosis not present

## 2020-08-22 DIAGNOSIS — H16301 Unspecified interstitial keratitis, right eye: Secondary | ICD-10-CM | POA: Diagnosis not present

## 2020-08-23 DIAGNOSIS — H16021 Ring corneal ulcer, right eye: Secondary | ICD-10-CM | POA: Diagnosis not present

## 2020-08-23 NOTE — Progress Notes (Signed)
Patient Care Team: [name], DO as PCP - General (Family Medicine) Mauro Kaufmann, RN as Oncology Nurse Navigator Rockwell Germany, RN as Oncology Nurse Navigator Gwyndolyn Kaufman, RN as Registered Nurse Gwyndolyn Kaufman, RN as Registered Nurse  DIAGNOSIS:    ICD-10-CM   1. Malignant neoplasm of upper-outer quadrant of right breast in female, estrogen receptor positive ([place])  C50.411    Z17.0     SUMMARY OF ONCOLOGIC HISTORY: Oncology History  Malignant neoplasm of upper-outer quadrant of right breast in female, estrogen receptor positive ([place])  07/28/2019 Initial Diagnosis   Screening mammogram detected right breast mass 11:30 position subareolar 1.3 cm, indeterminate 5 mm mass: Biopsy fibroadenoma, right axillary lymph node present, biopsy of the lymph node and the mass revealed grade 2 IDC ER 10%, PR 0%, Ki-67 45%, HER-2 +3+ by [facility]   08/20/2019 Cancer Staging   Staging form: Breast, AJCC 8th Edition - Clinical stage from 08/20/2019: Stage IIA (cT2, cN1, cM0, G2, ER+, PR-, HER2+) - Signed by Eppie Gibson, MD on 01/26/2020   09/02/2019 - 12/18/2019 Chemotherapy   The patient had dexamethasone (DECADRON) 4 MG tablet, 4 mg (100 % of original dose 4 mg), Oral, Daily, 1 of 1 cycle, Start date: 08/20/2019, End date: 01/06/2020 Dose modification: 4 mg (original dose 4 mg, Cycle 0) palonosetron (ALOXI) injection 0.25 mg, 0.25 mg, Intravenous,  Once, 6 of 6 cycles Administration: 0.25 mg (09/02/2019), 0.25 mg (09/23/2019), 0.25 mg (11/25/2019), 0.25 mg (12/16/2019), 0.25 mg (10/14/2019), 0.25 mg (11/04/2019) pegfilgrastim-jmdb (FULPHILA) injection 6 mg, 6 mg, Subcutaneous,  Once, 6 of 6 cycles Administration: 6 mg (09/04/2019), 6 mg (09/25/2019), 6 mg (11/27/2019), 6 mg (12/18/2019), 6 mg (10/16/2019), 6 mg (11/06/2019) CARBOplatin (PARAPLATIN) 700 mg in sodium chloride 0.9 % 250 mL chemo infusion, 700 mg (100 % of original dose 700 mg), Intravenous,  Once, 6 of 6 cycles Dose modification: 700  mg (original dose 700 mg, Cycle 1), 700 mg (original dose 700 mg, Cycle 5), 600 mg (original dose 700 mg, Cycle 5, Reason: Other (see comments), Comment: dose reduce to 600 mg for CINV per [name]) Administration: 700 mg (09/02/2019), 700 mg (09/23/2019), 600 mg (11/25/2019), 600 mg (12/16/2019), 700 mg (10/14/2019), 600 mg (11/04/2019) DOCEtaxel (TAXOTERE) 150 mg in sodium chloride 0.9 % 250 mL chemo infusion, 75 mg/m2 = 150 mg, Intravenous,  Once, 6 of 6 cycles Dose modification: 50 mg/m2 (original dose 75 mg/m2, Cycle 5, Reason: Dose not tolerated), 50 mg/m2 (original dose 75 mg/m2, Cycle 4, Reason: Dose not tolerated) Administration: 150 mg (09/02/2019), 150 mg (09/23/2019), 100 mg (11/25/2019), 100 mg (12/16/2019), 150 mg (10/14/2019), 100 mg (11/04/2019) fosaprepitant (EMEND) 150 mg in sodium chloride 0.9 % 145 mL IVPB, 150 mg, Intravenous,  Once, 6 of 6 cycles Administration: 150 mg (09/02/2019), 150 mg (09/23/2019), 150 mg (11/25/2019), 150 mg (12/16/2019), 150 mg (10/14/2019), 150 mg (11/04/2019) pertuzumab (PERJETA) 420 mg in sodium chloride 0.9 % 250 mL chemo infusion, 420 mg (100 % of original dose 420 mg), Intravenous, Once, 5 of 5 cycles Dose modification: 420 mg (original dose 420 mg, Cycle 1, Reason: Provider Judgment) Administration: 420 mg (09/02/2019), 420 mg (09/23/2019), 420 mg (11/25/2019), 420 mg (10/14/2019), 420 mg (11/04/2019) trastuzumab-dkst (OGIVRI) 672 mg in sodium chloride 0.9 % 250 mL chemo infusion, 8 mg/kg = 672 mg, Intravenous,  Once, 6 of 6 cycles Administration: 672 mg (09/02/2019), 504 mg (09/23/2019), 504 mg (11/25/2019), 504 mg (12/16/2019), 504 mg (10/14/2019), 504 mg (11/04/2019)  for chemotherapy  treatment.    01/06/2020 -  Chemotherapy      Patient is on Antibody Plan: BREAST TRASTUZUMAB Q21D    01/14/2020 Surgery   Right lumpectomy Stephanie Brewer): no evidence of residual carcinoma, 4 right axillary lymph nodes negative for carcinoma.   02/16/2020 - 03/30/2020 Radiation Therapy   Adjuvant  radiation     CHIEF COMPLIANT: Herceptin maintenance  INTERVAL HISTORY: Stephanie Brewer is a 55 y.o. with above-mentioned history of right breast cancerwho completedneoadjuvant chemotherapy,underwent a right lumpectomy,radiation,and is currently on Herceptin maintenance.Mammogram on 08/12/20 showed no evidence of malignancy bilaterally. She presents to the clinic todayfor treatment.She has tolerated anastrozole extremely well.  Denies any hot flashes or arthralgias or myalgias.  She has an appointment for the port to be removed.   ALLERGIES:  is allergic to hyoscyamine, doxycycline, and nexium [esomeprazole].  MEDICATIONS:  Current Outpatient Medications  Medication Sig Dispense Refill  . anastrozole (ARIMIDEX) 1 MG tablet Take 1 tablet (1 mg total) by mouth daily. 90 tablet 3  . lidocaine-prilocaine (EMLA) cream Apply 1 application topically as needed.    . Minocycline HCl 90 MG TB24 Take 1 tablet by mouth daily. Only takes prn for break outs     Current Facility-Administered Medications  Medication Dose Route Frequency Provider Last Rate Last Admin  . 0.9 %  sodium chloride infusion   Intravenous PRN Eileen Stanford, PA-C       Facility-Administered Medications Ordered in Other Visits  Medication Dose Route Frequency Provider Last Rate Last Admin  . sodium chloride flush (NS) 0.9 % injection 10 mL  10 mL Intravenous PRN Nicholas Lose, MD   10 mL at 09/02/19 0839    PHYSICAL EXAMINATION: ECOG PERFORMANCE STATUS: 1 - Symptomatic but completely ambulatory  Vitals:   08/24/20 0934  BP: (!) 144/81  Pulse: 76  Resp: 16  Temp: (!) 97.5 F (36.4 C)  SpO2: 100%   Filed Weights   08/24/20 0934  Weight: 180 lb 12.8 oz (82 kg)    LABORATORY DATA:  I have reviewed the data as listed CMP Latest Ref Rng & Units 07/13/2020 06/01/2020 04/20/2020  Glucose 70 - 99 mg/dL 102(H) 115(H) 117(H)  BUN 6 - 20 mg/dL _0 Creatinine 0.44 - 1.00 mg/dL 0.72 0.72 0.79  Sodium 135 - 145  mmol/L 142 141 142  Potassium 3.5 - 5.1 mmol/L 4.0 4.1 4.0  Chloride 98 - 111 mmol/L 107 108 107  CO2 22 - 32 mmol/L _1 Calcium 8.9 - 10.3 mg/dL 8.7(L) 8.8(L) 9.2  Total Protein 6.5 - 8.1 g/dL 6.6 6.7 6.8  Total Bilirubin 0.3 - 1.2 mg/dL 0.5 0.4 0.5  Alkaline Phos 38 - 126 U/L 120 122 111  AST 15 - 41 U/L _2 ALT 0 - 44 U/L _3 Lab Results  Component Value Date   WBC 4.9 08/24/2020   HGB 11.9 (L) 08/24/2020   HCT 36.0 08/24/2020   MCV 87.8 08/24/2020   PLT 207 08/24/2020   NEUTROABS 3.3 08/24/2020    ASSESSMENT & PLAN:  Malignant neoplasm of upper-outer quadrant of right breast in female, estrogen receptor positive ([place]) 07/28/2019:Screening mammogram detected right breast mass 11:30 position subareolar 1.3 cm, indeterminate 5 mm mass: Biopsy fibroadenoma, right axillary lymph node present, biopsy of the lymph node and the mass revealed grade 2 IDC ER 10%, PR 0%, Ki-67 45%, HER-2 +3+ by IHC T1 cN1 M0 stage IIa  Treatment plan: 1. Neoadjuvant  chemotherapy with [place] Perjeta 6 cyclescompleted 12/16/19 (Perjeta D/Ced due to diarrhea)followed by Herceptin maintenance for 1 year 2. 01/14/2020: Right lumpectomy: Benign, no evidence of residual cancer, 0/4 lymph nodes negative, additional margins also negative, complete pathologic response(pathologic complete response) 3. Followed by adjuvant radiation therapycompleted 03/30/2020 4.  Antiestrogen therapy with anastrozole 1 mg daily x5 years starting 07/13/2020  Breast MRI: 3.8 cm tumor and 1 axillary lymphnode. ---------------------------------------------------------------------------------------------------------------------------------------------------- Treatment plan:Continue with Herceptin maintenanceevery 3 weeks.  Milus Banister last Herceptin treatment)   Acid reflux with a dry cough:onPepcid.   Echocardiogram3/1/22: EF 65-70 % Trigger finger surgery: Successful  Anastrozole toxicities: Denies  any adverse effects to anastrozole therapy. I discussed with her about neratinib and its pros and cons.  When she comes back to see her in 6 months we can discuss it again and decide if she wants to take it.  Neratinib counseling: Neratinib is up and HER-2 inhibitor that is currently approved for treatment of HER-2 positive breast cancer after conclusion of one year of Herceptin treatment. Based on the studies it does appear to have a progression free survival difference of 2% absolute value. The starting dose of the spill is 200 mg once daily. The greatest side effect of this medication is diarrhea. Patient will need to be on prophylactic antidiarrheal medications. Days 1 to 14: Loperamide 4 mg orally 3 times daily Days 15 to 56: Loperamide 4 mg orally twice daily Days 57 to 365: Loperamide 4 mg as needed (maximum: 16 mg/day) During the treatment will have to monitor the liver functions. Apart from the diarrhea other side effects include fatigue 27%, skin rash 18%, muscle spasms in 11%.   Breast cancer surveillance: Mammogram 08/12/2020: Benign breast density category B  Return to clinic in 6 months for follow-up and after that we can see her once a year.    No orders of the defined types were placed in this encounter.  The patient has a good understanding of the overall plan. she agrees with it. she will call with any problems that may develop before the next visit here.  Total time spent: 30 mins including face to face time and time spent for planning, charting and coordination of care  Rulon Eisenmenger, MD, MPH 08/24/2020  I, Molly Dorshimer, am acting as scribe for Dr. Nicholas Lose.  I have reviewed the above documentation for accuracy and completeness, and I agree with the above.

## 2020-08-24 ENCOUNTER — Inpatient Hospital Stay: Payer: BC Managed Care – PPO

## 2020-08-24 ENCOUNTER — Encounter: Payer: Self-pay | Admitting: *Deleted

## 2020-08-24 ENCOUNTER — Inpatient Hospital Stay: Payer: BC Managed Care – PPO | Attending: Hematology and Oncology

## 2020-08-24 ENCOUNTER — Other Ambulatory Visit: Payer: Self-pay

## 2020-08-24 ENCOUNTER — Inpatient Hospital Stay: Payer: BC Managed Care – PPO | Admitting: Emergency Medicine

## 2020-08-24 ENCOUNTER — Other Ambulatory Visit: Payer: BC Managed Care – PPO

## 2020-08-24 ENCOUNTER — Inpatient Hospital Stay (HOSPITAL_BASED_OUTPATIENT_CLINIC_OR_DEPARTMENT_OTHER): Payer: BC Managed Care – PPO | Admitting: Hematology and Oncology

## 2020-08-24 DIAGNOSIS — C50411 Malignant neoplasm of upper-outer quadrant of right female breast: Secondary | ICD-10-CM

## 2020-08-24 DIAGNOSIS — Z170 Estrogen receptor positive status [ER+]: Secondary | ICD-10-CM | POA: Diagnosis not present

## 2020-08-24 DIAGNOSIS — Z5112 Encounter for antineoplastic immunotherapy: Secondary | ICD-10-CM | POA: Insufficient documentation

## 2020-08-24 DIAGNOSIS — Z95828 Presence of other vascular implants and grafts: Secondary | ICD-10-CM

## 2020-08-24 LAB — CMP (CANCER CENTER ONLY)
ALT: 8 U/L (ref 0–44)
AST: 15 U/L (ref 15–41)
Albumin: 3.5 g/dL (ref 3.5–5.0)
Alkaline Phosphatase: 113 U/L (ref 38–126)
Anion gap: 10 (ref 5–15)
BUN: 10 mg/dL (ref 6–20)
CO2: 27 mmol/L (ref 22–32)
Calcium: 9.2 mg/dL (ref 8.9–10.3)
Chloride: 105 mmol/L (ref 98–111)
Creatinine: 0.7 mg/dL (ref 0.44–1.00)
GFR, Estimated: >60 mL/min (ref >60)
Glucose, Bld: 104 mg/dL — ABNORMAL HIGH (ref 70–99)
Potassium: 4 mmol/L (ref 3.5–5.1)
Sodium: 142 mmol/L (ref 135–145)
Total Bilirubin: 0.7 mg/dL (ref 0.3–1.2)
Total Protein: 6.8 g/dL (ref 6.5–8.1)

## 2020-08-24 LAB — CBC WITH DIFFERENTIAL (CANCER CENTER ONLY)
Abs Immature Granulocytes: 0.01 10*3/uL (ref 0.00–0.07)
Basophils Absolute: 0.1 10*3/uL (ref 0.0–0.1)
Basophils Relative: 1 %
Eosinophils Absolute: 0.3 10*3/uL (ref 0.0–0.5)
Eosinophils Relative: 5 %
HCT: 36 % (ref 36.0–46.0)
Hemoglobin: 11.9 g/dL — ABNORMAL LOW (ref 12.0–15.0)
Immature Granulocytes: 0 %
Lymphocytes Relative: 16 %
Lymphs Abs: 0.8 10*3/uL (ref 0.7–4.0)
MCH: 29 pg (ref 26.0–34.0)
MCHC: 33.1 g/dL (ref 30.0–36.0)
MCV: 87.8 fL (ref 80.0–100.0)
Monocytes Absolute: 0.5 10*3/uL (ref 0.1–1.0)
Monocytes Relative: 11 %
Neutro Abs: 3.3 10*3/uL (ref 1.7–7.7)
Neutrophils Relative %: 67 %
Platelet Count: 207 10*3/uL (ref 150–400)
RBC: 4.1 MIL/uL (ref 3.87–5.11)
RDW: 13.4 % (ref 11.5–15.5)
WBC Count: 4.9 10*3/uL (ref 4.0–10.5)
nRBC: 0 % (ref 0.0–0.2)

## 2020-08-24 MED ORDER — SODIUM CHLORIDE 0.9% FLUSH
10.0000 mL | Freq: Once | INTRAVENOUS | Status: AC
  Administered 2020-08-24: 10 mL
  Filled 2020-08-24: qty 10

## 2020-08-24 MED ORDER — HEPARIN SOD (PORK) LOCK FLUSH 100 UNIT/ML IV SOLN
500.0000 [IU] | Freq: Once | INTRAVENOUS | Status: AC | PRN
Start: 2020-08-24 — End: 2020-08-24
  Administered 2020-08-24: 500 [IU]
  Filled 2020-08-24: qty 5

## 2020-08-24 MED ORDER — ACETAMINOPHEN 325 MG PO TABS
ORAL_TABLET | ORAL | Status: AC
  Filled 2020-08-24: qty 2

## 2020-08-24 MED ORDER — ACETAMINOPHEN 325 MG PO TABS
650.0000 mg | ORAL_TABLET | Freq: Once | ORAL | Status: AC
  Administered 2020-08-24: 650 mg via ORAL

## 2020-08-24 MED ORDER — DIPHENHYDRAMINE HCL 25 MG PO CAPS
ORAL_CAPSULE | ORAL | Status: AC
  Filled 2020-08-24: qty 1

## 2020-08-24 MED ORDER — DIPHENHYDRAMINE HCL 25 MG PO CAPS
25.0000 mg | ORAL_CAPSULE | Freq: Once | ORAL | Status: AC
  Administered 2020-08-24: 25 mg via ORAL

## 2020-08-24 MED ORDER — TRASTUZUMAB-DKST CHEMO 150 MG IV SOLR
450.0000 mg | Freq: Once | INTRAVENOUS | Status: AC
  Administered 2020-08-24: 450 mg via INTRAVENOUS
  Filled 2020-08-24: qty 21.43

## 2020-08-24 MED ORDER — SODIUM CHLORIDE 0.9% FLUSH
10.0000 mL | INTRAVENOUS | Status: DC | PRN
  Administered 2020-08-24: 10 mL
  Filled 2020-08-24: qty 10

## 2020-08-24 MED ORDER — SODIUM CHLORIDE 0.9 % IV SOLN
Freq: Once | INTRAVENOUS | Status: AC
  Filled 2020-08-24: qty 250

## 2020-08-24 NOTE — Research (Signed)
S1714 - A Prospective Observational Cohort Study to Develop a Predictive Model of Taxane-Induced Peripheral Neuropathy in Cancer Patients  Patient arrives at the [facility] by her husband for her week 22 visit. She reports some mild tingling in her toes on her right foot.  She also reports some mild tingling in her right hand.  She denies taking any medications to relieve this tingling.     PROs: Per study protocol,  Week 52 PROs required for this visit were completed prior to other study activities and completeness has been verified.     LABS: The pt did not consent to the optional whole blood collection.  Therefore, no research samples were obtained today.     MEDICATION REVIEW: Patient reviews and verifies the current medication list is correct.  She states she is only taking anastrozole daily along with a Vitamin D3 daily.  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  She stated she took 1 pill (325 mg) once this week for her ocular symptoms.  She denies using ibuprofen.    SUPPLEMENTS/TOPICAL AGENTS/ALTERNATIVE MEDICINES: Stephanie Brewer denies taking the following vitamins or supplements:  Vitamin E, Glutamine, Vitamin B6, Fish Oil, and Vitamin B12.  She is currently not using any topical agents. She specifically denies any alternative or complementary medicines/treatments.  NEUROPEN/TUNING FORK:The neuropen and tuning fork assessments were completed without difficulty by Foye Spurling, research nurse. All lower extremity and upper extremity vibration sites are tested on her dominant side (left) per protocol.  This research nurse provided assistance with the timing and recording of the collected data.   TIMED GET UP AND GO TEST: Completed per protocol by this certified research RN. The pt's time was 8.1 sec.  HISTORY OF FALLS: Patient confirmed "no" to history of falls in the past 6 months.      PROVIDER VISIT:The pt was seen and examined by  [name] this morning.  He completed the Solicited Neuropathy Events form and the Follow-Up Physician Assessment.    TREATMENT: Per Dr. Geralyn Flash note, the pt will continue with Herceptin maintenance every 3 weeks.    PLAN:  Upon completion of the S1714 assessments, Stephanie Brewer is thanked for her time and continued participation in the S1714 study. The current plan is to call Stephanie Brewer for her year 2 assessments in June 2023. Stephanie Brewer is encouraged to call for any needs or questions: she verbalizes understanding.   Brion Aliment RN, BSN, CCRP Clinical Research Nurse Lead 08/24/2020 10:29 AM

## 2020-08-24 NOTE — Assessment & Plan Note (Signed)
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 Neoadjuvant chemotherapy with [place] Perjeta 6 cyclescompleted 12/16/19 (Perjeta D/Ced due to diarrhea)followed by Herceptin maintenance for 1 year 2. 01/14/2020: Right lumpectomy: Benign, no evidence of residual cancer, 0/4 lymph nodes negative, additional margins also negative, complete pathologic response(pathologic complete response) 3. Followed by adjuvant radiation therapycompleted 03/30/2020 4.  Antiestrogen therapy with anastrozole 1 mg daily x5 years starting 07/13/2020  Breast MRI: 3.8 cm tumor and 1 axillary lymphnode. ---------------------------------------------------------------------------------------------------------------------------------------------------- Treatment plan:Continue with Herceptin maintenanceevery 3 weeks.  Milus Banister last Herceptin treatment)   Acid reflux with a dry cough:onPepcid.   Echocardiogram3/1/22: EF 65-70 % Trigger finger surgery: Successful  Anastrozole toxicities:  Breast cancer surveillance: Mammogram 08/12/2020: Benign breast density category B  Return to clinic in 6 months for follow-up and after that we can see her once a year.

## 2020-08-25 ENCOUNTER — Telehealth: Payer: Self-pay | Admitting: Hematology and Oncology

## 2020-08-25 NOTE — Telephone Encounter (Signed)
Scheduled appointment per 05/25 los. Left a detailed message.

## 2020-08-26 ENCOUNTER — Encounter: Payer: Self-pay | Admitting: *Deleted

## 2020-08-26 DIAGNOSIS — H18231 Secondary corneal edema, right eye: Secondary | ICD-10-CM | POA: Diagnosis not present

## 2020-08-31 DIAGNOSIS — H1820 Unspecified corneal edema: Secondary | ICD-10-CM | POA: Diagnosis not present

## 2020-09-01 DIAGNOSIS — M65332 Trigger finger, left middle finger: Secondary | ICD-10-CM | POA: Diagnosis not present

## 2020-09-01 DIAGNOSIS — M25642 Stiffness of left hand, not elsewhere classified: Secondary | ICD-10-CM | POA: Diagnosis not present

## 2020-09-01 DIAGNOSIS — R29898 Other symptoms and signs involving the musculoskeletal system: Secondary | ICD-10-CM | POA: Diagnosis not present

## 2020-09-13 NOTE — Progress Notes (Signed)
Diagnosis: COVID  Provider:  Marshell Garfinkel, MD  Procedure: Infusion  IV Type: Peripheral, IV Location: L Antecubital  Bebtelovimab, Dose: 175mg   Infusion Start Time: 8485  Infusion Stop Time: 9276  Post Infusion IV Care: Observation period completed and Peripheral IV Discontinued  Discharge: Condition: Good, Destination: Home . AVS provided to patient.   Performed by:  Koren Shiver RN

## 2020-09-14 DIAGNOSIS — Z01419 Encounter for gynecological examination (general) (routine) without abnormal findings: Secondary | ICD-10-CM | POA: Diagnosis not present

## 2020-09-14 DIAGNOSIS — Z6831 Body mass index (BMI) 31.0-31.9, adult: Secondary | ICD-10-CM | POA: Diagnosis not present

## 2020-09-22 ENCOUNTER — Ambulatory Visit (INDEPENDENT_AMBULATORY_CARE_PROVIDER_SITE_OTHER): Payer: BC Managed Care – PPO | Admitting: Plastic Surgery

## 2020-09-22 ENCOUNTER — Encounter: Payer: Self-pay | Admitting: Plastic Surgery

## 2020-09-22 ENCOUNTER — Other Ambulatory Visit: Payer: Self-pay

## 2020-09-22 VITALS — BP 122/80 | HR 75 | Ht 64.0 in | Wt 183.0 lb

## 2020-09-22 DIAGNOSIS — Z170 Estrogen receptor positive status [ER+]: Secondary | ICD-10-CM

## 2020-09-22 DIAGNOSIS — C50411 Malignant neoplasm of upper-outer quadrant of right female breast: Secondary | ICD-10-CM | POA: Diagnosis not present

## 2020-09-22 NOTE — Progress Notes (Signed)
   Referring Provider [name], DO [name],   [id number]   CC:  Chief Complaint  Patient presents with   consult      Stephanie Brewer is an 55 y.o. female.  HPI: Patient presents to discuss breast reconstruction.  She previously had implants were placed for cosmetic reasons.  These were removed in anticipation of her lumpectomy and subsequent radiation on the right side.  She has done well with that and has finished her cancer treatment.  She is planning to have her port which is in her left upper chest removed soon.  She would like to discuss replacement of implants and potential mastopexy to correct nipple areolar complex position asymmetries.  Review of Systems General: Denies fevers and chills  Physical Exam Vitals with BMI 09/22/2020 08/24/2020 07/22/2020  Height 5\' 4"  5\' 4"  -  Weight 183 lbs 180 lbs 13 oz -  BMI 33.3 83.29 -  Systolic 191 660 600  Diastolic 80 81 78  Pulse 75 76 85    General:  No acute distress,  Alert and oriented, Non-Toxic, Normal speech and affect Breast: She has very minimal ptosis.  Nipple areolar complex is slightly lower on the left side compared to the right radiated side.  She does have mild skin changes from the radiation on the right.  Base width is 11 to 12 cm.  Assessment/Plan Patient presents with contour irregularities after treatment for breast cancer.  We discussed placement of implants on both sides.  She prefers these to be in the prepectoral plane to avoid animation deformity that she had before which I think is a reasonable choice in her case.  Regarding size she seems to prefer the size range of 150 to 200 cc to give her the volume that she wants.  I explained that after placement of the sizers I did check the nipple areolar complex position and potentially do a periareolar mastopexy on the left side to get it to match the right side.  We also discussed the potential for fat grafting to help with symmetry  irregularities and potentially help with the radiation skin changes.  We discussed the risk the procedure that include bleeding, infection, damage to surrounding structures and need for additional procedures.  We discussed wound healing complications that might result in loss of the implants.  All her questions were answered and she elects to move forward.  Cindra Presume 09/22/2020, 12:51 PM

## 2020-09-23 DIAGNOSIS — Z452 Encounter for adjustment and management of vascular access device: Secondary | ICD-10-CM | POA: Diagnosis not present

## 2020-09-26 ENCOUNTER — Ambulatory Visit: Payer: BC Managed Care – PPO | Attending: General Surgery

## 2020-09-26 ENCOUNTER — Other Ambulatory Visit: Payer: Self-pay

## 2020-09-26 DIAGNOSIS — Z170 Estrogen receptor positive status [ER+]: Secondary | ICD-10-CM | POA: Insufficient documentation

## 2020-09-26 DIAGNOSIS — C50411 Malignant neoplasm of upper-outer quadrant of right female breast: Secondary | ICD-10-CM

## 2020-09-26 NOTE — Therapy (Addendum)
[place], [place], [address] Phone: [phone]   Fax:  [phone]  Physical Therapy Treatment  Patient Details  Name: Stephanie Brewer MRN: [id number] Date of Birth: 03/19/1966 No data recorded  Encounter Date: 09/26/2020   PT End of Session - 09/26/20 0810     Visit Number 2   # unchanged due to screen only   Number of Visits 2    PT Start Time 0802    PT Stop Time 0810    PT Time Calculation (min) 8 min    Activity Tolerance Patient tolerated treatment well    Behavior During Therapy [facility] for tasks assessed/performed             Past Medical History:  Diagnosis Date   Cancer ([place])    DVT (deep venous thrombosis) ([place])    left leg knee and ankle    GERD (gastroesophageal reflux disease)     Past Surgical History:  Procedure Laterality Date   BREAST IMPLANT REMOVAL Bilateral 01/14/2020   Procedure: REMOVAL BREAST IMPLANTS;  Surgeon: Cindra Presume, MD;  Location: [facility];  Service: Plastics;  Laterality: Bilateral;   BREAST LUMPECTOMY WITH RADIOACTIVE SEED AND SENTINEL LYMPH NODE BIOPSY Right 01/14/2020   Procedure: Right breast seed localized lumpectomy with sentinel lymph node biopsy;  Surgeon: Rolm Bookbinder, MD;  Location: [facility];  Service: General;  Laterality: Right;   BREAST SURGERY     Augmentation 2003   HERNIA REPAIR  2002   PORTACATH PLACEMENT N/A 09/01/2019   Procedure: INSERTION PORT-A-CATH WITH ULTRASOUND GUIDANCE;  Surgeon: Rolm Bookbinder, MD;  Location: [place];  Service: General;  Laterality: N/A;   TRIGGER FINGER RELEASE Left 05/09/2020   Procedure: RELEASE TRIGGER FINGER/A-1 PULLEY LEFT LONG;  Surgeon: Leanora Cover, MD;  Location: [facility];  Service: Orthopedics;  Laterality: Left;    There were no vitals filed for this visit.   Subjective Assessment - 09/26/20 0807     Subjective Pt  returns for 3 month L-Dex screen. "I have noticed some tingling down my arm when I reach with my Rt arm."    Pertinent History R breast cancer, grade 2 invasive ductal carcinoma, ER +, PR-, HER2+, beginning chemo next week 09/02/19, 01/15/20- R lumpectomy and SLNB (4 nodes all negative), will begin radiation 02/15/20,  hx of LLE DVT following foot surgery for bunion                    L-DEX FLOWSHEETS - 09/26/20 0800       L-DEX LYMPHEDEMA SCREENING   Measurement Type Unilateral    L-DEX MEASUREMENT EXTREMITY Upper Extremity    POSITION  Standing    DOMINANT SIDE Left    At Risk Side Right    BASELINE SCORE (UNILATERAL) 1.4    L-DEX SCORE (UNILATERAL) 3.1    VALUE CHANGE (UNILAT) 1.7                                    PT Long Term Goals - 02/03/20 1448       PT LONG TERM GOAL #1   Title Pt will demonstrate she has regained full shoulder ROM and function post operatively compared to baselines.    Time 8    Period Weeks    Status Achieved  Plan - 09/26/20 0810     Clinical Impression Statement Pt returns for her 3 month L-Dex screen. Her change from baseline of 1.7 is WNLs so no further treatment is required at this time except to cont every 3 month L-Dex screens which pt is agreeable to. Did encourage pt to cont focusing on her end Rt shoulder ROM stretching in doorway multiple times during day to help decrease fascial restrictions that may be causing the tingling she has been noticing in her axilla. Pt verbalized understanding.    PT Next Visit Plan Cont every 3 month L-Dex screens for up to 2 years from her SLNB.    Consulted and Agree with Plan of Care Patient             Patient will benefit from skilled therapeutic intervention in order to improve the following deficits and impairments:     Visit Diagnosis: Malignant neoplasm of upper-outer quadrant of right breast in female, estrogen receptor positive  ([place])     Problem List Patient Active Problem List   Diagnosis Date Noted   Port-A-Cath in place 09/09/2019   Malignant neoplasm of upper-outer quadrant of right breast in female, estrogen receptor positive ([place]) 08/20/2019   Radicular leg pain 02/04/2018   Numbness and tingling 02/04/2018   Foot pain, left 02/04/2018   Displacement of breast implant 12/24/2017   H/O bilateral breast implants 12/24/2017   GERD (gastroesophageal reflux disease) 12/06/2015   Gluten intolerance 12/06/2015    [name], PTA 09/26/2020, 9:14 AM  [facility] [place], [place], [address] Phone: [phone]   Fax:  [phone]  Name: Stephanie Brewer MRN: [id number] Date of Birth: 11-24-1965

## 2020-10-06 ENCOUNTER — Telehealth: Payer: Self-pay | Admitting: Dietician

## 2020-10-06 ENCOUNTER — Telehealth: Payer: Self-pay | Admitting: Nutrition

## 2020-10-06 NOTE — Telephone Encounter (Signed)
Scheduled appt per 7/7 sch msg. Pt aware.  

## 2020-10-06 NOTE — Telephone Encounter (Signed)
R/s appt to a virtual visit per Ernestene Kiel. Pt is aware of change to appt.

## 2020-10-10 ENCOUNTER — Other Ambulatory Visit: Payer: Self-pay | Admitting: *Deleted

## 2020-10-10 ENCOUNTER — Telehealth: Payer: Self-pay | Admitting: *Deleted

## 2020-10-10 ENCOUNTER — Encounter: Payer: BC Managed Care – PPO | Admitting: Nutrition

## 2020-10-10 DIAGNOSIS — Z170 Estrogen receptor positive status [ER+]: Secondary | ICD-10-CM

## 2020-10-10 DIAGNOSIS — C50411 Malignant neoplasm of upper-outer quadrant of right female breast: Secondary | ICD-10-CM

## 2020-10-10 NOTE — Telephone Encounter (Signed)
Pt c/o h/a x 3 weeks with upper back and neck pain as well. Per [name] MR spine and brain. Pt notified.

## 2020-10-11 NOTE — Progress Notes (Signed)
Nutrition Follow-up:  Patient with right breast cancer. She completed neoadjuvant chemotherapy 12/16/19 followed by Herceptin maintenance for 1 year. S/p right lumpectomy 01/14/20 followed by radiation therapy completed 03/30/20. Patient is currently receiving antiestrogen therapy with anastrozole x 5 years. Patient is scheduled for breast reconstruction on 11/29/20.  Spoke with patient via telephone this morning. She reports "struggling" with diet and weight gain since starting antiestrogen therapy. Patient has increased activity, reports walking, dancing, light aerobics. She is s/p foot surgery in February 2021, says this continues to cause her pain and limits types/intensity of exercise ability. Patient recalls previously drinking protein shakes, but did not tolerate during chemo and has not tried them again. She is not a big breakfast person, usually has cup of decaf or light roast coffee with packet of stevia. For lunch patient typically eats a salad, yesterday she had tacos. For dinner she recalls mostly salads, sometimes taco salads with lean (94/4) meat and light vinaigrette. She does not care for fish, recalls eating starkist lemon pepper tuna 4x/year, does not eat much chicken, states she can take it or leave it. Patient reports she really enjoys carbohydrates and sweets. Reports some days husband will remind her that she needs to eat and patient will grab a few cookies or chips verses eating balanced meal. Patient reports water intake varies, some days she drinks 2 L and others she has 16 oz. Patient reports she drinks decaf hot green tea due to fluid retention. She took a fluid pill the other day. Patient reports taking anastrozole at bedtime because it makes her feel nauseas. She takes this with calcium and vitamin D.  Medications: reviewed  Labs: 5/25 Glucose 104  Anthropometrics: Weight 183 lb on 6/23  5/25 - 180 lb 12.8 oz 4/13 - 180 lb 6.4 oz 3/2 - 175 lb 14.4 oz    NUTRITION  DIAGNOSIS: Inadequate oral intake ongoing    INTERVENTION:  Educated on recommendations for plant-based diet, will mail handout Educated on not skipping meals, eating smaller meals more frequently Discussed strategies for meal consistency, will mail healthy menu ~2000 kcal ideas  Discussed importance of maximizing nutrition prior to surgery, encouraged protein foods Encouraged increased activity as able, offered alternate activity suggestions, educated on programs offered at [facility], will mail handout Discussed taking vitamin D with food Contact information provided   MONITORING, EVALUATION, GOAL: weight trends, intake   NEXT VISIT: no follow-up - patient to contact as needed

## 2020-10-12 ENCOUNTER — Inpatient Hospital Stay: Payer: BC Managed Care – PPO | Attending: Hematology and Oncology | Admitting: Dietician

## 2020-10-20 ENCOUNTER — Ambulatory Visit (HOSPITAL_COMMUNITY)
Admission: RE | Admit: 2020-10-20 | Discharge: 2020-10-20 | Disposition: A | Discharge status: Home / Self Care | Payer: BC Managed Care – PPO | Source: Ambulatory Visit | Attending: Hematology and Oncology | Admitting: Hematology and Oncology

## 2020-10-20 ENCOUNTER — Other Ambulatory Visit: Payer: Self-pay

## 2020-10-20 DIAGNOSIS — C50411 Malignant neoplasm of upper-outer quadrant of right female breast: Secondary | ICD-10-CM | POA: Insufficient documentation

## 2020-10-20 DIAGNOSIS — Z170 Estrogen receptor positive status [ER+]: Secondary | ICD-10-CM

## 2020-10-20 DIAGNOSIS — M5136 Other intervertebral disc degeneration, lumbar region: Secondary | ICD-10-CM | POA: Diagnosis not present

## 2020-10-20 DIAGNOSIS — M4802 Spinal stenosis, cervical region: Secondary | ICD-10-CM | POA: Diagnosis not present

## 2020-10-20 DIAGNOSIS — Z853 Personal history of malignant neoplasm of breast: Secondary | ICD-10-CM | POA: Diagnosis not present

## 2020-10-20 DIAGNOSIS — M5127 Other intervertebral disc displacement, lumbosacral region: Secondary | ICD-10-CM | POA: Diagnosis not present

## 2020-10-20 DIAGNOSIS — R519 Headache, unspecified: Secondary | ICD-10-CM | POA: Diagnosis not present

## 2020-10-20 DIAGNOSIS — M47812 Spondylosis without myelopathy or radiculopathy, cervical region: Secondary | ICD-10-CM | POA: Diagnosis not present

## 2020-10-20 MED ORDER — GADOBUTROL 1 MMOL/ML IV SOLN
8.0000 mL | Freq: Once | INTRAVENOUS | Status: AC | PRN
  Administered 2020-10-20: 8 mL via INTRAVENOUS

## 2020-10-21 ENCOUNTER — Encounter: Payer: Self-pay | Admitting: *Deleted

## 2020-10-26 ENCOUNTER — Encounter: Payer: Self-pay | Admitting: Hematology and Oncology

## 2020-11-01 ENCOUNTER — Encounter: Payer: Self-pay | Admitting: Hematology and Oncology

## 2020-11-04 ENCOUNTER — Encounter: Payer: Self-pay | Admitting: Hematology and Oncology

## 2020-11-11 ENCOUNTER — Encounter: Payer: Self-pay | Admitting: Surgical

## 2020-11-11 ENCOUNTER — Other Ambulatory Visit: Payer: Self-pay

## 2020-11-11 ENCOUNTER — Ambulatory Visit (INDEPENDENT_AMBULATORY_CARE_PROVIDER_SITE_OTHER): Payer: BC Managed Care – PPO | Admitting: Surgical

## 2020-11-11 ENCOUNTER — Encounter: Payer: Self-pay | Admitting: Hematology and Oncology

## 2020-11-11 VITALS — BP 114/27 | HR 72 | Ht 64.0 in | Wt 186.4 lb

## 2020-11-11 DIAGNOSIS — B372 Candidiasis of skin and nail: Secondary | ICD-10-CM

## 2020-11-11 DIAGNOSIS — C50411 Malignant neoplasm of upper-outer quadrant of right female breast: Secondary | ICD-10-CM

## 2020-11-11 DIAGNOSIS — Z170 Estrogen receptor positive status [ER+]: Secondary | ICD-10-CM

## 2020-11-11 MED ORDER — TRAMADOL HCL 50 MG PO TABS: 50.0000 mg | ORAL_TABLET | Freq: Three times a day (TID) | ORAL | 0 refills | Status: AC | PRN

## 2020-11-11 NOTE — Progress Notes (Signed)
Patient ID: Stephanie Brewer, female    DOB: 1965/08/23, 55 y.o.   MRN: [id number]  Chief Complaint  Patient presents with   Pre-op Exam      ICD-10-CM   1. Malignant neoplasm of upper-outer quadrant of right breast in female, estrogen receptor positive ([place])  C50.411    Z17.0     2. Candidiasis, intertrigo  B37.2       History of Present Illness: Stephanie Brewer is a 55 y.o.  female  with a history of neoplasm of right breast with subsequent radiation to right breast.  She presents for preoperative evaluation for upcoming procedure, placement of bilateral gel breast implants, possible left mastopexy and possible Lipo filling of bilateral breast, scheduled for 11/29/2020 with Dr. Claudia Desanctis.  The patient has not had problems with anesthesia.  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  No family or personal history of bleeding or clotting disorders.  Patient is not currently taking any blood thinners.  No history of CVA/MI.   Summary of Previous Visit: Patient previously had implants placed for cosmetic reasons these were removed in anticipation of her lumpectomy and subsequent radiation on the right side.  She is done well with that and has finished her cancer treatment.  She is planning to have her port which is in her left upper chest removed soon.  Regarding size she seems to prefer the size range of 150 to 200 cc to give her the volume that she wants.  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  Currently on anastrozole.  History of GERD.  Patient reports today that she is not sure if she would like to move forward with placement of implants, she reported that she would like to be about a C cup and she went to try on bras recently and felt as if she was the size she would like to be, however feels as if she would like more volume in the upper pole of her left breast.   Past Medical  History: Allergies: Allergies  Allergen Reactions   Hyoscyamine Anaphylaxis   Doxycycline     Racing heart.   Nexium [Esomeprazole]     Swollen throat    Current Medications:  Current Outpatient Medications:    anastrozole (ARIMIDEX) 1 MG tablet, Take 1 tablet (1 mg total) by mouth daily., Disp: 90 tablet, Rfl: 3  Current Facility-Administered Medications:    0.9 %  sodium chloride infusion, , Intravenous, PRN, Eileen Stanford, PA-C  Facility-Administered Medications Ordered in Other Visits:    sodium chloride flush (NS) 0.9 % injection 10 mL, 10 mL, Intravenous, PRN, Nicholas Lose, MD, 10 mL at 09/02/19 [id number]  Past Medical Problems: Past Medical History:  Diagnosis Date   Cancer ([place])    DVT (deep venous thrombosis) ([facility])    left leg knee and ankle    GERD (gastroesophageal reflux disease)     Past Surgical History: Past Surgical History:  Procedure Laterality Date   BREAST IMPLANT REMOVAL Bilateral 01/14/2020   Procedure: REMOVAL BREAST IMPLANTS;  Surgeon: Cindra Presume, MD;  Location: [facility];  Service: Plastics;  Laterality: Bilateral;   BREAST LUMPECTOMY WITH RADIOACTIVE SEED AND SENTINEL LYMPH NODE BIOPSY Right 01/14/2020   Procedure: Right breast seed localized lumpectomy with sentinel lymph node biopsy;  Surgeon: Rolm Bookbinder, MD;  Location: [facility];  Service: General;  Laterality: Right;   BREAST SURGERY     Augmentation 2003   HERNIA REPAIR  2002   PORTACATH PLACEMENT N/A 09/01/2019   Procedure: INSERTION PORT-A-CATH WITH ULTRASOUND GUIDANCE;  Surgeon: Rolm Bookbinder, MD;  Location: [facility];  Service: General;  Laterality: N/A;   TRIGGER FINGER RELEASE Left 05/09/2020   Procedure: RELEASE TRIGGER FINGER/A-1 PULLEY LEFT LONG;  Surgeon: Leanora Cover, MD;  Location: [facility];  Service: Orthopedics;  Laterality: Left;    Social History: Social History   Socioeconomic History    Marital status: Married    Spouse name: Not on file   Number of children: 2   Years of education: some college   Highest education level: Not on file  Occupational History   Occupation: [employer]  Tobacco Use   Smoking status: Never   Smokeless tobacco: Never  Vaping Use   Vaping Use: Never used  Substance and Sexual Activity   Alcohol use: Not Currently    Comment: rare-twice monthly or less   Drug use: No   Sexual activity: Yes    Birth control/protection: I.U.D.  Other Topics Concern   Not on file  Social History Narrative   Lives with spouse   Caffeine use: no caffeine    Left handed    Social Determinants of Health   Financial Resource Strain: Not on file  Food Insecurity: Not on file  Transportation Needs: Not on file  Physical Activity: Not on file  Stress: Not on file  Social Connections: Not on file  Intimate Partner Violence: Not on file    Family History: Family History  Problem Relation Age of Onset   Hypertension Father     Review of Systems: Review of Systems  Constitutional: Negative.   Respiratory: Negative.    Cardiovascular: Negative.   Gastrointestinal: Negative.   Neurological: Negative.    Physical Exam: Vital Signs BP (!) 114/27 (BP Location: Left Arm)   Pulse 72   Ht '5\' 4"'$  (1.626 m)   Wt 186 lb 6.4 oz (84.6 kg)   SpO2 96%   BMI 32.00 kg/m   Physical Exam  Constitutional:      General: Not in acute distress.    Appearance: Normal appearance. Not ill-appearing.  HENT:     Head: Normocephalic and atraumatic.  Eyes:     Pupils: Pupils are equal, round Neck:     Musculoskeletal: Normal range of motion.  Cardiovascular:     Rate and Rhythm: Normal rate    Pulses: Normal pulses.  Pulmonary:     Effort: Pulmonary effort is normal. No respiratory distress.  Abdominal:     General: Abdomen is flat. There is no distension.  Musculoskeletal: Normal range of motion.  Skin:    General: Skin is warm and dry.     Findings: No  erythema or rash.  Neurological:     General: No focal deficit present.     Mental Status: Alert and oriented to person, place, and time. Mental status is at baseline.     Motor: No weakness.  Psychiatric:        Mood and Affect: Mood normal.        Behavior: Behavior normal.    Assessment/Plan: The patient is scheduled for possible placement of bilateral gel breast implants, possible left mastopexy and possible fat grafting to bilateral breasts with Dr. Claudia Desanctis.  Risks, benefits, and alternatives of procedure discussed, questions answered and consent obtained.  Smoking Status: Non-smoker; Counseling Given?  N/A Last Mammogram: 08/12/2020; Results: No mammographic evidence of malignancy in either breast  Caprini Score: 10, highest; Risk Factors include: Age, BMI rater than 25, history of DVT, currently on hormone replacement therapy, history of cancer and length of planned surgery. Recommendation for mechanical and prophylaxis. Encourage early ambulation.  May need 1 to 2 weeks of Lovenox postoperatively.  Patient is in agreement with this.  Will discuss with Dr. Claudia Desanctis.  Pictures obtained: '@consult'$   Post-op Rx sent to pharmacy:  Tramadol  Patient was provided with the General Surgical Risk consent document and Pain Medication Agreement prior to their appointment.  They had adequate time to read through the risk consent documents and Pain Medication Agreement. We also discussed them in person together during this preop appointment. All of their questions were answered to their satisfaction.  Recommended calling if they have any further questions.  Risk consent form and Pain Medication Agreement to be scanned into patient's chart.  Patient was provided with the Mentor implant patient decision checklist and this was completed during today's preoperative evaluation. Patient had time to read through the information and any questions were answered to their content. Form will be scanned into patient's  chart.  Discussed with patient increased risk of postoperative complications with a history of radiation.  Discussed increased risk of DVT given her history.  Discussed fat grafting and the chance of poor take of the fat that is grafted to bilateral breasts.  Patient reported today that she is unsure if she would like to move forward with placement of implants as she feels as if she has a current size she would like to be, but would like more volume in the superior pole of her left breast.  I discussed with the patient today that I would route a message to Dr. Claudia Desanctis to further discuss and we could set up a virtual visit to further discuss.  I did take pictures and placed him in the chart with patient's permission for review with Dr. Claudia Desanctis.  All of her questions were answered to her content.  Electronically signed by: Carola Rhine Paisley Grajeda, PA-C 11/11/2020 9:39 AM

## 2020-11-11 NOTE — H&P (View-Only) (Signed)
Patient ID: Stephanie Brewer, female    DOB: 01-22-1966, 55 y.o.   MRN: [id number]  Chief Complaint  Patient presents with   Pre-op Exam      ICD-10-CM   1. Malignant neoplasm of upper-outer quadrant of right breast in female, estrogen receptor positive ([place])  C50.411    Z17.0     2. Candidiasis, intertrigo  B37.2       History of Present Illness: Stephanie Brewer is a 55 y.o.  female  with a history of neoplasm of right breast with subsequent radiation to right breast.  She presents for preoperative evaluation for upcoming procedure, placement of bilateral gel breast implants, possible left mastopexy and possible Lipo filling of bilateral breast, scheduled for 11/29/2020 with Dr. Claudia Desanctis.  The patient has not had problems with anesthesia.  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  No family or personal history of bleeding or clotting disorders.  Patient is not currently taking any blood thinners.  No history of CVA/MI.   Summary of Previous Visit: Patient previously had implants placed for cosmetic reasons these were removed in anticipation of her lumpectomy and subsequent radiation on the right side.  She is done well with that and has finished her cancer treatment.  She is planning to have her port which is in her left upper chest removed soon.  Regarding size she seems to prefer the size range of 150 to 200 cc to give her the volume that she wants.  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  Currently on anastrozole.  History of GERD.  Patient reports today that she is not sure if she would like to move forward with placement of implants, she reported that she would like to be about a C cup and she went to try on bras recently and felt as if she was the size she would like to be, however feels as if she would like more volume in the upper pole of her left breast.   Past Medical  History: Allergies: Allergies  Allergen Reactions   Hyoscyamine Anaphylaxis   Doxycycline     Racing heart.   Nexium [Esomeprazole]     Swollen throat    Current Medications:  Current Outpatient Medications:    anastrozole (ARIMIDEX) 1 MG tablet, Take 1 tablet (1 mg total) by mouth daily., Disp: 90 tablet, Rfl: 3  Current Facility-Administered Medications:    0.9 %  sodium chloride infusion, , Intravenous, PRN, Eileen Stanford, PA-C  Facility-Administered Medications Ordered in Other Visits:    sodium chloride flush (NS) 0.9 % injection 10 mL, 10 mL, Intravenous, PRN, Nicholas Lose, MD, 10 mL at 09/02/19 [id number]  Past Medical Problems: Past Medical History:  Diagnosis Date   Cancer ([place])    DVT (deep venous thrombosis) ([facility])    left leg knee and ankle    GERD (gastroesophageal reflux disease)     Past Surgical History: Past Surgical History:  Procedure Laterality Date   BREAST IMPLANT REMOVAL Bilateral 01/14/2020   Procedure: REMOVAL BREAST IMPLANTS;  Surgeon: Cindra Presume, MD;  Location: [facility];  Service: Plastics;  Laterality: Bilateral;   BREAST LUMPECTOMY WITH RADIOACTIVE SEED AND SENTINEL LYMPH NODE BIOPSY Right 01/14/2020   Procedure: Right breast seed localized lumpectomy with sentinel lymph node biopsy;  Surgeon: Rolm Bookbinder, MD;  Location: [facility];  Service: General;  Laterality: Right;   BREAST SURGERY     Augmentation 2003   HERNIA REPAIR  2002   PORTACATH PLACEMENT N/A 09/01/2019   Procedure: INSERTION PORT-A-CATH WITH ULTRASOUND GUIDANCE;  Surgeon: Rolm Bookbinder, MD;  Location: [facility];  Service: General;  Laterality: N/A;   TRIGGER FINGER RELEASE Left 05/09/2020   Procedure: RELEASE TRIGGER FINGER/A-1 PULLEY LEFT LONG;  Surgeon: Leanora Cover, MD;  Location: [facility];  Service: Orthopedics;  Laterality: Left;    Social History: Social History   Socioeconomic History    Marital status: Married    Spouse name: Not on file   Number of children: 2   Years of education: some college   Highest education level: Not on file  Occupational History   Occupation: [employer]  Tobacco Use   Smoking status: Never   Smokeless tobacco: Never  Vaping Use   Vaping Use: Never used  Substance and Sexual Activity   Alcohol use: Not Currently    Comment: rare-twice monthly or less   Drug use: No   Sexual activity: Yes    Birth control/protection: I.U.D.  Other Topics Concern   Not on file  Social History Narrative   Lives with spouse   Caffeine use: no caffeine    Left handed    Social Determinants of Health   Financial Resource Strain: Not on file  Food Insecurity: Not on file  Transportation Needs: Not on file  Physical Activity: Not on file  Stress: Not on file  Social Connections: Not on file  Intimate Partner Violence: Not on file    Family History: Family History  Problem Relation Age of Onset   Hypertension Father     Review of Systems: Review of Systems  Constitutional: Negative.   Respiratory: Negative.    Cardiovascular: Negative.   Gastrointestinal: Negative.   Neurological: Negative.    Physical Exam: Vital Signs BP (!) 114/27 (BP Location: Left Arm)   Pulse 72   Ht '5\' 4"'$  (1.626 m)   Wt 186 lb 6.4 oz (84.6 kg)   SpO2 96%   BMI 32.00 kg/m   Physical Exam  Constitutional:      General: Not in acute distress.    Appearance: Normal appearance. Not ill-appearing.  HENT:     Head: Normocephalic and atraumatic.  Eyes:     Pupils: Pupils are equal, round Neck:     Musculoskeletal: Normal range of motion.  Cardiovascular:     Rate and Rhythm: Normal rate    Pulses: Normal pulses.  Pulmonary:     Effort: Pulmonary effort is normal. No respiratory distress.  Abdominal:     General: Abdomen is flat. There is no distension.  Musculoskeletal: Normal range of motion.  Skin:    General: Skin is warm and dry.     Findings: No  erythema or rash.  Neurological:     General: No focal deficit present.     Mental Status: Alert and oriented to person, place, and time. Mental status is at baseline.     Motor: No weakness.  Psychiatric:        Mood and Affect: Mood normal.        Behavior: Behavior normal.    Assessment/Plan: The patient is scheduled for possible placement of bilateral gel breast implants, possible left mastopexy and possible fat grafting to bilateral breasts with Dr. Claudia Desanctis.  Risks, benefits, and alternatives of procedure discussed, questions answered and consent obtained.  Smoking Status: Non-smoker; Counseling Given?  N/A Last Mammogram: 08/12/2020; Results: No mammographic evidence of malignancy in either breast  Caprini Score: 10, highest; Risk Factors include: Age, BMI rater than 25, history of DVT, currently on hormone replacement therapy, history of cancer and length of planned surgery. Recommendation for mechanical and prophylaxis. Encourage early ambulation.  May need 1 to 2 weeks of Lovenox postoperatively.  Patient is in agreement with this.  Will discuss with Dr. Claudia Desanctis.  Pictures obtained: '@consult'$   Post-op Rx sent to pharmacy:  Tramadol  Patient was provided with the General Surgical Risk consent document and Pain Medication Agreement prior to their appointment.  They had adequate time to read through the risk consent documents and Pain Medication Agreement. We also discussed them in person together during this preop appointment. All of their questions were answered to their satisfaction.  Recommended calling if they have any further questions.  Risk consent form and Pain Medication Agreement to be scanned into patient's chart.  Patient was provided with the Mentor implant patient decision checklist and this was completed during today's preoperative evaluation. Patient had time to read through the information and any questions were answered to their content. Form will be scanned into patient's  chart.  Discussed with patient increased risk of postoperative complications with a history of radiation.  Discussed increased risk of DVT given her history.  Discussed fat grafting and the chance of poor take of the fat that is grafted to bilateral breasts.  Patient reported today that she is unsure if she would like to move forward with placement of implants as she feels as if she has a current size she would like to be, but would like more volume in the superior pole of her left breast.  I discussed with the patient today that I would route a message to Dr. Claudia Desanctis to further discuss and we could set up a virtual visit to further discuss.  I did take pictures and placed him in the chart with patient's permission for review with Dr. Claudia Desanctis.  All of her questions were answered to her content.  Electronically signed by: Carola Rhine Danene Montijo, PA-C 11/11/2020 9:39 AM

## 2020-11-15 DIAGNOSIS — H53143 Visual discomfort, bilateral: Secondary | ICD-10-CM | POA: Diagnosis not present

## 2020-11-15 DIAGNOSIS — H5213 Myopia, bilateral: Secondary | ICD-10-CM | POA: Diagnosis not present

## 2020-11-21 ENCOUNTER — Encounter (HOSPITAL_BASED_OUTPATIENT_CLINIC_OR_DEPARTMENT_OTHER): Payer: Self-pay | Admitting: Plastic Surgery

## 2020-11-21 ENCOUNTER — Other Ambulatory Visit: Payer: Self-pay

## 2020-11-24 ENCOUNTER — Other Ambulatory Visit: Payer: Self-pay

## 2020-11-24 ENCOUNTER — Ambulatory Visit (INDEPENDENT_AMBULATORY_CARE_PROVIDER_SITE_OTHER): Payer: BC Managed Care – PPO | Admitting: Plastic Surgery

## 2020-11-24 DIAGNOSIS — Z170 Estrogen receptor positive status [ER+]: Secondary | ICD-10-CM | POA: Diagnosis not present

## 2020-11-24 DIAGNOSIS — C50411 Malignant neoplasm of upper-outer quadrant of right female breast: Secondary | ICD-10-CM

## 2020-11-24 NOTE — Progress Notes (Signed)
Patient presents for follow-up discussion regarding her reconstructive breast surgery.  She initially had a bilateral augmentation but was then found to have a right-sided cancer.  The implants were removed and the right sided cancer was treated with breast conservation therapy.  We will plan to replace implants and do a left-sided lift and fat grafting as needed.  She then reconsidered the idea of using implants and wants to discuss that.  She is uncertain if she wants to be much bigger than she already is as she already measures to be a C cup.  I had a long discussion with her about the pros and cons of various alternatives.  Ultimately I think she is leaning towards the conservative choice which would be a left-sided lift followed by fat grafting to the upper pole on both sides.  I do think this would give her a satisfactory outcome and if she did want to be bigger down the line she can always have an implant placed fairly easily if she were to want that.  We will plan to have this be the course for now and we will see her shortly for her surgery.

## 2020-11-29 ENCOUNTER — Ambulatory Visit (HOSPITAL_BASED_OUTPATIENT_CLINIC_OR_DEPARTMENT_OTHER): Payer: BC Managed Care – PPO | Admitting: Anesthesiology

## 2020-11-29 ENCOUNTER — Encounter (HOSPITAL_BASED_OUTPATIENT_CLINIC_OR_DEPARTMENT_OTHER): Payer: Self-pay | Admitting: Plastic Surgery

## 2020-11-29 ENCOUNTER — Telehealth: Payer: Self-pay | Admitting: Physician Assistant

## 2020-11-29 ENCOUNTER — Other Ambulatory Visit: Payer: Self-pay

## 2020-11-29 ENCOUNTER — Encounter (HOSPITAL_BASED_OUTPATIENT_CLINIC_OR_DEPARTMENT_OTHER)
Admission: RE | Disposition: A | Discharge status: Home / Self Care | Payer: Self-pay | Source: Home / Self Care | Attending: Plastic Surgery

## 2020-11-29 ENCOUNTER — Other Ambulatory Visit: Payer: Self-pay | Admitting: Physician Assistant

## 2020-11-29 ENCOUNTER — Ambulatory Visit (HOSPITAL_BASED_OUTPATIENT_CLINIC_OR_DEPARTMENT_OTHER)
Admission: RE | Admit: 2020-11-29 | Discharge: 2020-11-29 | Disposition: A | Discharge status: Home / Self Care | Payer: BC Managed Care – PPO | Attending: Plastic Surgery | Admitting: Plastic Surgery

## 2020-11-29 ENCOUNTER — Telehealth: Payer: Self-pay

## 2020-11-29 DIAGNOSIS — Z888 Allergy status to other drugs, medicaments and biological substances status: Secondary | ICD-10-CM | POA: Diagnosis not present

## 2020-11-29 DIAGNOSIS — N651 Disproportion of reconstructed breast: Secondary | ICD-10-CM | POA: Diagnosis not present

## 2020-11-29 DIAGNOSIS — Z86718 Personal history of other venous thrombosis and embolism: Secondary | ICD-10-CM | POA: Insufficient documentation

## 2020-11-29 DIAGNOSIS — K219 Gastro-esophageal reflux disease without esophagitis: Secondary | ICD-10-CM | POA: Diagnosis not present

## 2020-11-29 DIAGNOSIS — I82402 Acute embolism and thrombosis of unspecified deep veins of left lower extremity: Secondary | ICD-10-CM | POA: Diagnosis not present

## 2020-11-29 DIAGNOSIS — Z170 Estrogen receptor positive status [ER+]: Secondary | ICD-10-CM | POA: Diagnosis not present

## 2020-11-29 DIAGNOSIS — C50411 Malignant neoplasm of upper-outer quadrant of right female breast: Secondary | ICD-10-CM | POA: Insufficient documentation

## 2020-11-29 DIAGNOSIS — D242 Benign neoplasm of left breast: Secondary | ICD-10-CM | POA: Insufficient documentation

## 2020-11-29 DIAGNOSIS — N6489 Other specified disorders of breast: Secondary | ICD-10-CM | POA: Insufficient documentation

## 2020-11-29 DIAGNOSIS — Z79899 Other long term (current) drug therapy: Secondary | ICD-10-CM | POA: Diagnosis not present

## 2020-11-29 HISTORY — PX: MASTOPEXY: SHX5358

## 2020-11-29 HISTORY — PX: LIPOSUCTION WITH LIPOFILLING: SHX6436

## 2020-11-29 SURGERY — MASTOPEXY
Anesthesia: General | Site: Breast | Laterality: Left

## 2020-11-29 MED ORDER — SUFENTANIL CITRATE 50 MCG/ML IV SOLN
INTRAVENOUS | Status: DC | PRN
  Administered 2020-11-29: 10 ug via INTRAVENOUS

## 2020-11-29 MED ORDER — MIDAZOLAM HCL 2 MG/2ML IJ SOLN
INTRAMUSCULAR | Status: AC
  Filled 2020-11-29: qty 2

## 2020-11-29 MED ORDER — CEFAZOLIN SODIUM-DEXTROSE 2-4 GM/100ML-% IV SOLN
2.0000 g | INTRAVENOUS | Status: AC
  Administered 2020-11-29: 2 g via INTRAVENOUS

## 2020-11-29 MED ORDER — AMISULPRIDE (ANTIEMETIC) 5 MG/2ML IV SOLN: 10.0000 mg | Freq: Once | INTRAVENOUS | Status: DC | PRN

## 2020-11-29 MED ORDER — LACTATED RINGERS IV SOLN
INTRAVENOUS | Status: DC | PRN
  Administered 2020-11-29: 1000 mL
  Administered 2020-11-29: 250 mL

## 2020-11-29 MED ORDER — BUPIVACAINE HCL (PF) 0.25 % IJ SOLN
INTRAMUSCULAR | Status: AC
  Filled 2020-11-29: qty 30

## 2020-11-29 MED ORDER — HYDROMORPHONE HCL 1 MG/ML IJ SOLN
INTRAMUSCULAR | Status: AC
  Filled 2020-11-29: qty 0.5

## 2020-11-29 MED ORDER — ONDANSETRON HCL 4 MG/2ML IJ SOLN
INTRAMUSCULAR | Status: DC | PRN
  Administered 2020-11-29: 4 mg via INTRAVENOUS

## 2020-11-29 MED ORDER — LIDOCAINE HCL (CARDIAC) PF 100 MG/5ML IV SOSY
PREFILLED_SYRINGE | INTRAVENOUS | Status: DC | PRN
  Administered 2020-11-29: 60 mg via INTRAVENOUS

## 2020-11-29 MED ORDER — CEFAZOLIN SODIUM-DEXTROSE 2-4 GM/100ML-% IV SOLN
INTRAVENOUS | Status: AC
  Filled 2020-11-29: qty 100

## 2020-11-29 MED ORDER — OXYCODONE HCL 5 MG PO TABS
5.0000 mg | ORAL_TABLET | Freq: Once | ORAL | Status: AC | PRN
  Administered 2020-11-29: 5 mg via ORAL

## 2020-11-29 MED ORDER — 0.9 % SODIUM CHLORIDE (POUR BTL) OPTIME
TOPICAL | Status: DC | PRN
  Administered 2020-11-29: 1000 mL

## 2020-11-29 MED ORDER — MIDAZOLAM HCL 5 MG/5ML IJ SOLN
INTRAMUSCULAR | Status: DC | PRN
  Administered 2020-11-29: 2 mg via INTRAVENOUS

## 2020-11-29 MED ORDER — LACTATED RINGERS IV SOLN: INTRAVENOUS | Status: DC

## 2020-11-29 MED ORDER — OXYCODONE HCL 5 MG/5ML PO SOLN
5.0000 mg | Freq: Once | ORAL | Status: AC | PRN
Start: 2020-11-29 — End: 2020-11-29

## 2020-11-29 MED ORDER — ACETAMINOPHEN 325 MG PO TABS
ORAL_TABLET | ORAL | Status: AC
  Filled 2020-11-29: qty 2

## 2020-11-29 MED ORDER — PROMETHAZINE HCL 25 MG/ML IJ SOLN: 6.2500 mg | INTRAMUSCULAR | Status: DC | PRN

## 2020-11-29 MED ORDER — PROPOFOL 10 MG/ML IV BOLUS
INTRAVENOUS | Status: DC | PRN
  Administered 2020-11-29: 200 mg via INTRAVENOUS

## 2020-11-29 MED ORDER — OXYCODONE HCL 5 MG PO TABS
ORAL_TABLET | ORAL | Status: AC
  Filled 2020-11-29: qty 1

## 2020-11-29 MED ORDER — SUFENTANIL CITRATE 50 MCG/ML IV SOLN
INTRAVENOUS | Status: AC
  Filled 2020-11-29: qty 1

## 2020-11-29 MED ORDER — MEPERIDINE HCL 25 MG/ML IJ SOLN: 6.2500 mg | INTRAMUSCULAR | Status: DC | PRN

## 2020-11-29 MED ORDER — EPINEPHRINE PF 1 MG/ML IJ SOLN
INTRAMUSCULAR | Status: AC
  Filled 2020-11-29: qty 1

## 2020-11-29 MED ORDER — ACETAMINOPHEN 325 MG PO TABS
650.0000 mg | ORAL_TABLET | Freq: Once | ORAL | Status: AC
  Administered 2020-11-29: 650 mg via ORAL

## 2020-11-29 MED ORDER — LACTATED RINGERS IV SOLN
INTRAVENOUS | Status: AC | PRN
  Administered 2020-11-29: 1000 mL via INTRAVENOUS

## 2020-11-29 MED ORDER — HYDROMORPHONE HCL 1 MG/ML IJ SOLN
0.2500 mg | INTRAMUSCULAR | Status: DC | PRN
  Administered 2020-11-29 (×2): 0.5 mg via INTRAVENOUS

## 2020-11-29 MED ORDER — DEXAMETHASONE SODIUM PHOSPHATE 4 MG/ML IJ SOLN
INTRAMUSCULAR | Status: DC | PRN
Start: 2020-11-29 — End: 2020-11-29
  Administered 2020-11-29: 10 mg via INTRAVENOUS

## 2020-11-29 SURGICAL SUPPLY — 108 items
ADH SKN CLS APL DERMABOND .7 (GAUZE/BANDAGES/DRESSINGS) ×4
APL PRP STRL LF DISP 70% ISPRP (MISCELLANEOUS) ×4
APL SKNCLS STERI-STRIP NONHPOA (GAUZE/BANDAGES/DRESSINGS) ×4
BAG DECANTER FOR FLEXI CONT (MISCELLANEOUS) ×2 IMPLANT
BENZOIN TINCTURE PRP APPL 2/3 (GAUZE/BANDAGES/DRESSINGS) ×6 IMPLANT
BINDER ABDOMINAL 10 UNV 27-48 (MISCELLANEOUS) ×1 IMPLANT
BINDER ABDOMINAL 12 SM 30-45 (SOFTGOODS) IMPLANT
BLADE SURG 10 STRL SS (BLADE) ×6 IMPLANT
BLADE SURG 11 STRL SS (BLADE) ×3 IMPLANT
BLADE SURG 15 STRL LF DISP TIS (BLADE) ×2 IMPLANT
BLADE SURG 15 STRL SS (BLADE) ×3
BNDG CMPR MED 10X6 ELC LF (GAUZE/BANDAGES/DRESSINGS) ×2
BNDG ELASTIC 6X10 VLCR STRL LF (GAUZE/BANDAGES/DRESSINGS) ×3 IMPLANT
BNDG GAUZE ELAST 4 BULKY (GAUZE/BANDAGES/DRESSINGS) IMPLANT
CANISTER LIPO FAT HARVEST (MISCELLANEOUS) IMPLANT
CANISTER SUCT 1200ML W/VALVE (MISCELLANEOUS) ×3 IMPLANT
CHLORAPREP W/TINT 26 (MISCELLANEOUS) ×6 IMPLANT
CLIP TI MEDIUM 6 (CLIP) IMPLANT
COVER BACK TABLE 60X90IN (DRAPES) ×3 IMPLANT
COVER MAYO STAND STRL (DRAPES) ×5 IMPLANT
DECANTER SPIKE VIAL GLASS SM (MISCELLANEOUS) IMPLANT
DERMABOND ADVANCED (GAUZE/BANDAGES/DRESSINGS) ×2
DERMABOND ADVANCED .7 DNX12 (GAUZE/BANDAGES/DRESSINGS) IMPLANT
DRAIN CHANNEL 15F RND FF W/TCR (WOUND CARE) IMPLANT
DRAIN PENROSE 1/2X12 LTX STRL (WOUND CARE) IMPLANT
DRAPE LAPAROSCOPIC ABDOMINAL (DRAPES) ×3 IMPLANT
DRAPE TOP ARMCOVERS (MISCELLANEOUS) IMPLANT
DRAPE U-SHAPE 76X120 STRL (DRAPES) IMPLANT
DRAPE UTILITY XL STRL (DRAPES) ×3 IMPLANT
DRSG PAD ABDOMINAL 8X10 ST (GAUZE/BANDAGES/DRESSINGS) ×12 IMPLANT
ELECT BLADE 4.0 EZ CLEAN MEGAD (MISCELLANEOUS)
ELECT COATED BLADE 2.86 ST (ELECTRODE) IMPLANT
ELECT REM PT RETURN 9FT ADLT (ELECTROSURGICAL) ×3
ELECTRODE BLDE 4.0 EZ CLN MEGD (MISCELLANEOUS) IMPLANT
ELECTRODE REM PT RTRN 9FT ADLT (ELECTROSURGICAL) ×2 IMPLANT
EVACUATOR SILICONE 100CC (DRAIN) IMPLANT
EXTRACTOR CANIST REVOLVE STRL (CANNISTER) ×1 IMPLANT
FUNNEL KELLER 2 DISP (MISCELLANEOUS) IMPLANT
GAUZE SPONGE 4X4 12PLY STRL (GAUZE/BANDAGES/DRESSINGS) ×6 IMPLANT
GAUZE XEROFORM 5X9 LF (GAUZE/BANDAGES/DRESSINGS) IMPLANT
GLOVE SRG 8 PF TXTR STRL LF DI (GLOVE) ×2 IMPLANT
GLOVE SURG ENC MOIS LTX SZ6 (GLOVE) ×4 IMPLANT
GLOVE SURG ENC MOIS LTX SZ6.5 (GLOVE) IMPLANT
GLOVE SURG ENC MOIS LTX SZ7.5 (GLOVE) IMPLANT
GLOVE SURG ENC TEXT LTX SZ7.5 (GLOVE) ×4 IMPLANT
GLOVE SURG LTX SZ6.5 (GLOVE) IMPLANT
GLOVE SURG POLYISO LF SZ8 (GLOVE) ×1 IMPLANT
GLOVE SURG UNDER POLY LF SZ7 (GLOVE) ×1 IMPLANT
GLOVE SURG UNDER POLY LF SZ8 (GLOVE)
GOWN STRL REUS W/ TWL LRG LVL3 (GOWN DISPOSABLE) ×6 IMPLANT
GOWN STRL REUS W/TWL 2XL LVL3 (GOWN DISPOSABLE) ×1 IMPLANT
GOWN STRL REUS W/TWL LRG LVL3 (GOWN DISPOSABLE) ×6
IV NS 500ML (IV SOLUTION)
IV NS 500ML BAXH (IV SOLUTION) IMPLANT
KIT FILL SYSTEM UNIVERSAL (SET/KITS/TRAYS/PACK) IMPLANT
LINER CANISTER 1000CC FLEX (MISCELLANEOUS) ×4 IMPLANT
MARKER SKIN DUAL TIP RULER LAB (MISCELLANEOUS) IMPLANT
NDL FILTER BLUNT 18X1 1/2 (NEEDLE) ×2 IMPLANT
NDL HYPO 25X1 1.5 SAFETY (NEEDLE) ×2 IMPLANT
NDL SAFETY ECLIPSE 18X1.5 (NEEDLE) ×2 IMPLANT
NDL SPNL 18GX3.5 QUINCKE PK (NEEDLE) ×2 IMPLANT
NEEDLE FILTER BLUNT 18X 1/2SAF (NEEDLE) ×1
NEEDLE FILTER BLUNT 18X1 1/2 (NEEDLE) ×2 IMPLANT
NEEDLE HYPO 18GX1.5 SHARP (NEEDLE) ×3
NEEDLE HYPO 25X1 1.5 SAFETY (NEEDLE) IMPLANT
NEEDLE SPNL 18GX3.5 QUINCKE PK (NEEDLE) ×3 IMPLANT
NS IRRIG 1000ML POUR BTL (IV SOLUTION) ×3 IMPLANT
PACK BASIN DAY SURGERY FS (CUSTOM PROCEDURE TRAY) ×3 IMPLANT
PAD ALCOHOL SWAB (MISCELLANEOUS) ×3 IMPLANT
PENCIL SMOKE EVACUATOR (MISCELLANEOUS) ×3 IMPLANT
PIN SAFETY STERILE (MISCELLANEOUS) IMPLANT
SHEET MEDIUM DRAPE 40X70 STRL (DRAPES) ×1 IMPLANT
SLEEVE SCD COMPRESS KNEE MED (STOCKING) ×3 IMPLANT
SPONGE T-LAP 18X18 ~~LOC~~+RFID (SPONGE) ×9 IMPLANT
STAPLER INSORB 30 2030 C-SECTI (MISCELLANEOUS) ×3 IMPLANT
STAPLER VISISTAT 35W (STAPLE) ×3 IMPLANT
STRIP SUTURE WOUND CLOSURE 1/2 (MISCELLANEOUS) ×9 IMPLANT
SUT CHROMIC 4 0 PS 2 18 (SUTURE) IMPLANT
SUT CHROMIC 5 0 RB 1 27 (SUTURE) ×2 IMPLANT
SUT ETHILON 2 0 FS 18 (SUTURE) IMPLANT
SUT ETHILON 3 0 PS 1 (SUTURE) IMPLANT
SUT MNCRL AB 4-0 PS2 18 (SUTURE) ×6 IMPLANT
SUT PDS 3-0 CT2 (SUTURE) ×3
SUT PDS II 3-0 CT2 27 ABS (SUTURE) ×4 IMPLANT
SUT PLAIN 5 0 P 3 18 (SUTURE) ×1 IMPLANT
SUT SILK 2 0 SH (SUTURE) IMPLANT
SUT VIC AB 3-0 PS1 18 (SUTURE)
SUT VIC AB 3-0 PS1 18XBRD (SUTURE) IMPLANT
SUT VIC AB 3-0 SH 27 (SUTURE)
SUT VIC AB 3-0 SH 27X BRD (SUTURE) IMPLANT
SUT VICRYL 4-0 PS2 18IN ABS (SUTURE) IMPLANT
SUT VLOC 180 0 24IN GS25 (SUTURE) IMPLANT
SUT VLOC 180 P-14 24 (SUTURE) ×5 IMPLANT
SUT VLOC 90 P-14 23 (SUTURE) ×2 IMPLANT
SYR 10ML LL (SYRINGE) ×11 IMPLANT
SYR 50ML LL SCALE MARK (SYRINGE) ×1 IMPLANT
SYR BULB IRRIG 60ML STRL (SYRINGE) ×3 IMPLANT
SYR CONTROL 10ML LL (SYRINGE) ×3 IMPLANT
SYR TB 1ML LL NO SAFETY (SYRINGE) ×3 IMPLANT
SYR TOOMEY IRRIG 70ML (MISCELLANEOUS)
SYRINGE TOOMEY IRRIG 70ML (MISCELLANEOUS) IMPLANT
TAPE MEASURE VINYL STERILE (MISCELLANEOUS) IMPLANT
TOWEL GREEN STERILE FF (TOWEL DISPOSABLE) ×5 IMPLANT
TUBE CONNECTING 20X1/4 (TUBING) ×3 IMPLANT
TUBING INFILTRATION IT-10001 (TUBING) ×3 IMPLANT
TUBING SET GRADUATE ASPIR 12FT (MISCELLANEOUS) ×3 IMPLANT
UNDERPAD 30X36 HEAVY ABSORB (UNDERPADS AND DIAPERS) ×6 IMPLANT
YANKAUER SUCT BULB TIP NO VENT (SUCTIONS) ×3 IMPLANT

## 2020-11-29 NOTE — Brief Op Note (Signed)
11/29/2020  1:01 PM  PATIENT:  Stephanie Brewer  55 y.o. female  PRE-OPERATIVE DIAGNOSIS:  Malignant neoplasm of upper-outer quadrant of right breast  POST-OPERATIVE DIAGNOSIS:  Malignant neoplasm of upper-outer quadrant of right breast  PROCEDURE:  Procedure(s): LEFT MASTOPEXY (Left) LIPOSUCTION WITH LIPOFILLING FROM ABDOMEN TO BILATERAL BREASTS (Bilateral)  SURGEON:  Surgeon(s) and Role:    * Aislinn Feliz, Steffanie Dunn, MD - Primary  PHYSICIAN ASSISTANT: Software engineer, PA  ASSISTANTS: none   ANESTHESIA:   general  EBL:  25   BLOOD ADMINISTERED:none  DRAINS: none   LOCAL MEDICATIONS USED:  MARCAINE     SPECIMEN:  Source of Specimen:  left mastopexy  DISPOSITION OF SPECIMEN:  PATHOLOGY  COUNTS:  YES  TOURNIQUET:  * No tourniquets in log *  DICTATION: .Dragon Dictation  PLAN OF CARE: Discharge to home after PACU  PATIENT DISPOSITION:  PACU - hemodynamically stable.   Delay start of Pharmacological VTE agent (>24hrs) due to surgical blood loss or risk of bleeding: not applicable

## 2020-11-29 NOTE — Anesthesia Postprocedure Evaluation (Signed)
Anesthesia Post Note  Patient: Stephanie Brewer  Procedure(s) Performed: LEFT MASTOPEXY (Left: Breast) LIPOSUCTION WITH LIPOFILLING FROM ABDOMEN TO BILATERAL BREASTS (Bilateral: Breast)     Patient location during evaluation: PACU Anesthesia Type: General Level of consciousness: awake and alert Pain management: pain level controlled Vital Signs Assessment: post-procedure vital signs reviewed and stable Respiratory status: spontaneous breathing, nonlabored ventilation and respiratory function stable Cardiovascular status: blood pressure returned to baseline and stable Postop Assessment: no apparent nausea or vomiting Anesthetic complications: no   No notable events documented.  Last Vitals:  Vitals:   11/29/20 1400 11/29/20 1410  BP: 131/83 (!) 133/96  Pulse: 74 83  Resp: 17 16  Temp:  36.7 C  SpO2: 100% 98%    Last Pain:  Vitals:   11/29/20 1410  TempSrc:   PainSc: [place]

## 2020-11-29 NOTE — Telephone Encounter (Signed)
See other phone call note

## 2020-11-29 NOTE — Anesthesia Procedure Notes (Signed)
Procedure Name: LMA Insertion Date/Time: 11/29/2020 11:14 AM Performed by: Willa Frater, CRNA Pre-anesthesia Checklist: Patient identified, Emergency Drugs available, Suction available and Patient being monitored Patient Re-evaluated:Patient Re-evaluated prior to induction Oxygen Delivery Method: Circle system utilized Preoxygenation: Pre-oxygenation with 100% oxygen Induction Type: IV induction Ventilation: Mask ventilation without difficulty LMA: LMA inserted LMA Size: 4.0 Number of attempts: 1 Airway Equipment and Method: Bite block Placement Confirmation: positive ETCO2 Tube secured with: Tape Dental Injury: Teeth and Oropharynx as per pre-operative assessment

## 2020-11-29 NOTE — Discharge Instructions (Addendum)
Activity as tolerated: NO showers for 3 days. Keep ACE wrap on breasts and abdominal binder on abdomen until then. After showering, put ACE wrap/abdominal binder back on, this is important for compression. NO driving while in pain, taking pain medication or if you are unable to safely react to traffic. No heavy activities Take prescribed pain medication as needed for severe pain. Otherwise, you can use ibuprofen or tylenol as needed. Avoid more than 3,000 mg of tylenol in 24 hours.  Diet: Regular. Drink plenty of fluids and eat healthy, high protein, low carbs.  Wound Care: Keep dressing clean & dry. You may change bandages after showering if you continue to notice some drainage.  Special Instructions: Call Doctor if any unusual problems occur such as pain, excessive Bleeding, unrelieved Nausea/vomiting, Fever &/or chills  Follow-up appointment: Scheduled for next week.   Post Anesthesia Home Care Instructions  Activity: Get plenty of rest for the remainder of the day. A responsible individual must stay with you for 24 hours following the procedure.  For the next 24 hours, DO NOT: -Drive a car -Paediatric nurse -Drink alcoholic beverages -Take any medication unless instructed by your physician -Make any legal decisions or sign important papers.  Meals: Start with liquid foods such as gelatin or soup. Progress to regular foods as tolerated. Avoid greasy, spicy, heavy foods. If nausea and/or vomiting occur, drink only clear liquids until the nausea and/or vomiting subsides. Call your physician if vomiting continues.  Special Instructions/Symptoms: Your throat may feel dry or sore from the anesthesia or the breathing tube placed in your throat during surgery. If this causes discomfort, gargle with warm salt water. The discomfort should disappear within 24 hours.  If you had a scopolamine patch placed behind your ear for the management of post- operative nausea and/or vomiting:  1. The  medication in the patch is effective for 72 hours, after which it should be removed.  Wrap patch in a tissue and discard in the trash. Wash hands thoroughly with soap and water. 2. You may remove the patch earlier than 72 hours if you experience unpleasant side effects which may include dry mouth, dizziness or visual disturbances. 3. Avoid touching the patch. Wash your hands with soap and water after contact with the patch.

## 2020-11-29 NOTE — Op Note (Signed)
Operative Note   DATE OF OPERATION: 11/29/2020  SURGICAL DEPARTMENT: Plastic Surgery  PREOPERATIVE DIAGNOSES: Breast asymmetry after right breast cancer treatment  POSTOPERATIVE DIAGNOSES:  same  PROCEDURE: 1.  Left breast mastopexy for symmetry 2.  Bilateral breast fat grafting from the abdomen totaling 140 cc on the left and 160 cc on the right  SURGEON: Talmadge Coventry, MD  ASSISTANT: Verdie Shire, PA The advanced practice practitioner (APP) assisted throughout the case.  The APP was essential in retraction and counter traction when needed to make the case progress smoothly.  This retraction and assistance made it possible to see the tissue plans for the procedure.  The assistance was needed for blood control, tissue re-approximation and assisted with closure of the incision site.  ANESTHESIA:  General.   COMPLICATIONS: None.   INDICATIONS FOR PROCEDURE:  The patient, Stephanie Brewer is a 55 y.o. female born on 02-Sep-1965, is here for treatment of breast asymmetry after right breast cancer treatment. MRN: [id number]  CONSENT:  Informed consent was obtained directly from the patient. Risks, benefits and alternatives were fully discussed. Specific risks including but not limited to bleeding, infection, hematoma, seroma, scarring, pain, contracture, asymmetry, wound healing problems, and need for further surgery were all discussed. The patient did have an ample opportunity to have questions answered to satisfaction.   DESCRIPTION OF PROCEDURE:  The patient was taken to the operating room. SCDs were placed and antibiotics were given.  General anesthesia was administered.  The patient's operative site was prepped and draped in a sterile fashion. A time out was performed and all information was confirmed to be correct.  Had marked the patient in the standing position preoperatively for nipple symmetry.  A modified Wise pattern skin excision was marked implant on the left side.  This would  be mastopexy leaving the nipple areolar complex based on the superior dermal and glandular pedicle.  About 100 cc of tumescent solution was infiltrated along the marks for hemostasis.  I then marked the nipple areolar complex at about 38 mm diameter.  The periareolar and vertical extent of the Wise pattern were then de-epithelialized.  We dressed the incisions with a knife and asked sized a inferior wedge of tissue and a small amount extending into the vertical limb.  This was about 140 g of tissue.  Meticulous hemostasis was obtained.  Limited undermining was performed along the vertical limb to aid in redraping of the skin and it was stapled closed.  The patient was set up to check for shape size and symmetry which were quite good.  I then proceeded to close the mastopexy with interrupted.  3-0 PDS sutures in the vertical limb, 4-0 Monocryl in the periareolar limb, and Enzor stapler along the inframammary portion.  A 3 oh V-Loc was then run throughout.  I then began the fat grafting portion of the case.  The revolve system used utilized.  2 L of tumescent solution were infiltrated into the abdominal wall.  This was given time to work.  5 mm cannula was then used to do power assisted liposuction.  I did this until the maximum level was reached on the revolve.  The port sites were closed with interrupted 5-0 fast gut sutures.  Fat was then washed and processed according to the manufacturer's instructions and then infiltrated into the upper pole of bilateral breast.  This was done utilizing 10 cc syringe and injecting in small aliquots.  140 cc of fat was injected on the left and  160 cc of fat was injected on the right.  This gave a nice symmetric result.  Steri-Strips were then applied along the mastopexy incisions and a soft dressing was applied.  The patient tolerated the procedure well.  There were no complications. The patient was allowed to wake from anesthesia, extubated and taken to the recovery room in  satisfactory condition.

## 2020-11-29 NOTE — Anesthesia Preprocedure Evaluation (Signed)
Anesthesia Evaluation  Patient identified by MRN, date of birth, ID band Patient awake    Reviewed: Allergy & Precautions, NPO status , Patient's Chart, lab work & pertinent test results  History of Anesthesia Complications Negative for: history of anesthetic complications  Airway Mallampati: II  TM Distance: >3 FB Neck ROM: Full    Dental no notable dental hx.    Pulmonary neg pulmonary ROS,    Pulmonary exam normal breath sounds clear to auscultation       Cardiovascular + DVT  Normal cardiovascular exam Rhythm:Regular Rate:Normal     Neuro/Psych negative neurological ROS  negative psych ROS   GI/Hepatic Neg liver ROS, GERD  ,  Endo/Other  negative endocrine ROS  Renal/GU negative Renal ROS  negative genitourinary   Musculoskeletal negative musculoskeletal ROS (+)   Abdominal (+) + obese,   Peds  Hematology negative hematology ROS (+)   Anesthesia Other Findings  Breast cancer  Reproductive/Obstetrics                             Anesthesia Physical  Anesthesia Plan  ASA: II  Anesthesia Plan: General   Post-op Pain Management:    Induction: Intravenous  PONV Risk Score and Plan: 3 and Treatment may vary due to age or medical condition, Ondansetron, Dexamethasone and Midazolam  Airway Management Planned: LMA  Additional Equipment: None  Intra-op Plan:   Post-operative Plan: Extubation in OR  Informed Consent: I have reviewed the patients History and Physical, chart, labs and discussed the procedure including the risks, benefits and alternatives for the proposed anesthesia with the patient or authorized representative who has indicated his/her understanding and acceptance.       Plan Discussed with:   Anesthesia Plan Comments:         Anesthesia Quick Evaluation

## 2020-11-29 NOTE — Transfer of Care (Signed)
Immediate Anesthesia Transfer of Care Note  Patient: Stephanie Brewer  Procedure(s) Performed: LEFT MASTOPEXY (Left: Breast) LIPOSUCTION WITH LIPOFILLING FROM ABDOMEN TO BILATERAL BREASTS (Bilateral: Breast)  Patient Location: PACU  Anesthesia Type:General  Level of Consciousness: awake, alert  and oriented  Airway & Oxygen Therapy: Patient Spontanous Breathing and Patient connected to face mask oxygen  Post-op Assessment: Report given to RN and Post -op Vital signs reviewed and stable  Post vital signs: Reviewed and stable  Last Vitals:  Vitals Value Taken Time  BP    Temp    Pulse    Resp    SpO2      Last Pain:  Vitals:   11/29/20 1005  TempSrc: Oral  PainSc: 0-No pain         Complications: No notable events documented.

## 2020-11-29 NOTE — Telephone Encounter (Signed)
Please send orders for this patient. Sx 8/30. Please call to update [phone]. Thank you.

## 2020-11-29 NOTE — Telephone Encounter (Signed)
Patient called to say that she had bilateral breast surgery today and asked if we are planning to order her something prophylactically for the DVT that she has had in the past.  Please call.

## 2020-11-29 NOTE — Telephone Encounter (Signed)
Returned patients call. Advised I spoke with [place], [place]. Due to the type of surgery she had, Dr. Claudia Desanctis indicated no need for a prophylactic medication. She will be able to walk around and not at high risk for DVT. If should anything change after hours/during the night, call our office for the on-call physician or go to the ED. Patient understood and agreed with plan.

## 2020-11-29 NOTE — Interval H&P Note (Signed)
History and Physical Interval Note:  11/29/2020 10:53 AM  Stephanie Brewer  has presented today for surgery, with the diagnosis of Malignant neoplasm of upper-outer quadrant of right breast.  The various methods of treatment have been discussed with the patient and family. After consideration of risks, benefits and other options for treatment, the patient has consented to  Procedure(s) with comments: Placement of bilateral gel breast implants (Bilateral) - 90 min POSSIBLE MASTOPEXY (Left) POSSIBLE LIPOSUCTION WITH LIPOFILLING (Bilateral) as a surgical intervention.  The patient's history has been reviewed, patient examined, no change in status, stable for surgery.  I have reviewed the patient's chart and labs.  Questions were answered to the patient's satisfaction.     Cindra Presume

## 2020-11-30 ENCOUNTER — Encounter (HOSPITAL_BASED_OUTPATIENT_CLINIC_OR_DEPARTMENT_OTHER): Payer: Self-pay | Admitting: Plastic Surgery

## 2020-11-30 NOTE — Addendum Note (Signed)
Addendum  created 11/30/20 0955 by Lota Leamer, Ernesta Amble, CRNA   Charge Capture section accepted

## 2020-12-01 LAB — SURGICAL PATHOLOGY

## 2020-12-07 ENCOUNTER — Other Ambulatory Visit: Payer: Self-pay

## 2020-12-07 ENCOUNTER — Ambulatory Visit (INDEPENDENT_AMBULATORY_CARE_PROVIDER_SITE_OTHER): Payer: BC Managed Care – PPO | Admitting: Plastic Surgery

## 2020-12-07 ENCOUNTER — Encounter: Payer: Self-pay | Admitting: Plastic Surgery

## 2020-12-07 DIAGNOSIS — Z170 Estrogen receptor positive status [ER+]: Secondary | ICD-10-CM

## 2020-12-07 DIAGNOSIS — C50411 Malignant neoplasm of upper-outer quadrant of right female breast: Secondary | ICD-10-CM

## 2020-12-07 NOTE — Progress Notes (Signed)
Patient presents 1 week postop from left mastopexy and fat grafting.  She feels like things are going well.  On exam the left side is a little swelling at the moment appears little higher than the right but I would expect this to settle out with time.  Incisions are intact and nipple areolar complex is viable.  Abdominal donor site looks to be doing fine.  We will plan to see her again in 2 weeks.  All of her questions were answered.

## 2020-12-14 ENCOUNTER — Telehealth: Payer: Self-pay | Admitting: *Deleted

## 2020-12-14 DIAGNOSIS — M7989 Other specified soft tissue disorders: Secondary | ICD-10-CM

## 2020-12-14 NOTE — Progress Notes (Signed)
Patient Care Team: [name], DO as PCP - General (Family Medicine) Mauro Kaufmann, RN as Oncology Nurse Navigator Rockwell Germany, RN as Oncology Nurse Navigator Gwyndolyn Kaufman, RN (Inactive) as Registered Nurse Gwyndolyn Kaufman, RN (Inactive) as Registered Nurse  DIAGNOSIS:    ICD-10-CM   1. Malignant neoplasm of upper-outer quadrant of right breast in female, estrogen receptor positive ([place])  C50.411 CBC with Differential ([place])   Z17.0 CMP ([place] only)      SUMMARY OF ONCOLOGIC HISTORY: Oncology History  Malignant neoplasm of upper-outer quadrant of right breast in female, estrogen receptor positive ([place])  07/28/2019 Initial Diagnosis   Screening mammogram detected right breast mass 11:30 position subareolar 1.3 cm, indeterminate 5 mm mass: Biopsy fibroadenoma, right axillary lymph node present, biopsy of the lymph node and the mass revealed grade 2 IDC ER 10%, PR 0%, Ki-67 45%, HER-2 +3+ by [facility]   08/20/2019 Cancer Staging   Staging form: Breast, AJCC 8th Edition - Clinical stage from 08/20/2019: Stage IIA (cT2, cN1, cM0, G2, ER+, PR-, HER2+) - Signed by Eppie Gibson, MD on 01/26/2020   09/02/2019 - 12/18/2019 Chemotherapy   The patient had dexamethasone (DECADRON) 4 MG tablet, 4 mg (100 % of original dose 4 mg), Oral, Daily, 1 of 1 cycle, Start date: 08/20/2019, End date: 01/06/2020 Dose modification: 4 mg (original dose 4 mg, Cycle 0) palonosetron (ALOXI) injection 0.25 mg, 0.25 mg, Intravenous,  Once, 6 of 6 cycles Administration: 0.25 mg (09/02/2019), 0.25 mg (09/23/2019), 0.25 mg (11/25/2019), 0.25 mg (12/16/2019), 0.25 mg (10/14/2019), 0.25 mg (11/04/2019) pegfilgrastim-jmdb (FULPHILA) injection 6 mg, 6 mg, Subcutaneous,  Once, 6 of 6 cycles Administration: 6 mg (09/04/2019), 6 mg (09/25/2019), 6 mg (11/27/2019), 6 mg (12/18/2019), 6 mg (10/16/2019), 6 mg (11/06/2019) CARBOplatin (PARAPLATIN) 700 mg in sodium chloride 0.9 % 250 mL chemo infusion, 700 mg  (100 % of original dose 700 mg), Intravenous,  Once, 6 of 6 cycles Dose modification: 700 mg (original dose 700 mg, Cycle 1), 700 mg (original dose 700 mg, Cycle 5), 600 mg (original dose 700 mg, Cycle 5, Reason: Other (see comments), Comment: dose reduce to 600 mg for CINV per [name]) Administration: 700 mg (09/02/2019), 700 mg (09/23/2019), 600 mg (11/25/2019), 600 mg (12/16/2019), 700 mg (10/14/2019), 600 mg (11/04/2019) DOCEtaxel (TAXOTERE) 150 mg in sodium chloride 0.9 % 250 mL chemo infusion, 75 mg/m2 = 150 mg, Intravenous,  Once, 6 of 6 cycles Dose modification: 50 mg/m2 (original dose 75 mg/m2, Cycle 5, Reason: Dose not tolerated), 50 mg/m2 (original dose 75 mg/m2, Cycle 4, Reason: Dose not tolerated) Administration: 150 mg (09/02/2019), 150 mg (09/23/2019), 100 mg (11/25/2019), 100 mg (12/16/2019), 150 mg (10/14/2019), 100 mg (11/04/2019) fosaprepitant (EMEND) 150 mg in sodium chloride 0.9 % 145 mL IVPB, 150 mg, Intravenous,  Once, 6 of 6 cycles Administration: 150 mg (09/02/2019), 150 mg (09/23/2019), 150 mg (11/25/2019), 150 mg (12/16/2019), 150 mg (10/14/2019), 150 mg (11/04/2019) pertuzumab (PERJETA) 420 mg in sodium chloride 0.9 % 250 mL chemo infusion, 420 mg (100 % of original dose 420 mg), Intravenous, Once, 5 of 5 cycles Dose modification: 420 mg (original dose 420 mg, Cycle 1, Reason: Provider Judgment) Administration: 420 mg (09/02/2019), 420 mg (09/23/2019), 420 mg (11/25/2019), 420 mg (10/14/2019), 420 mg (11/04/2019) trastuzumab-dkst (OGIVRI) 672 mg in sodium chloride 0.9 % 250 mL chemo infusion, 8 mg/kg = 672 mg, Intravenous,  Once, 6 of 6 cycles Administration: 672 mg (09/02/2019), 504 mg (09/23/2019), 504 mg (11/25/2019), 504  mg (12/16/2019), 504 mg (10/14/2019), 504 mg (11/04/2019)   for chemotherapy treatment.     01/06/2020 - 08/24/2020 Chemotherapy          01/14/2020 Surgery   Right lumpectomy Stephanie Brewer): no evidence of residual carcinoma, 4 right axillary lymph nodes negative for carcinoma.    02/16/2020 - 03/30/2020 Radiation Therapy   Adjuvant radiation     CHIEF COMPLIANT: Left arm swelling and pain and bilateral lower extremity swelling INTERVAL HISTORY: Stephanie Brewer is a 55 y.o. with above-mentioned history of right breast cancer who completed neoadjuvant chemotherapy, underwent a right lumpectomy, radiation, and is currently on Herceptin maintenance. She presents to the clinic today for treatment.  She is here today to discuss the pros and cons of taking neratinib as well as the most recent issue is related to swelling of the left arm and bilateral lower extremities.  It all started about a week ago when she was traveling and she started noticing that the left arm is feeling warm and painful to touch.  ALLERGIES:  is allergic to hyoscyamine, doxycycline, and nexium [esomeprazole].  MEDICATIONS:  Current Outpatient Medications  Medication Sig Dispense Refill   Neratinib Maleate (NERLYNX) 40 MG tablet Take 3 tablets (120 mg total) by mouth daily. Take 2 tabs for 2 weeks, then 3 tabs for 4 weeks and then 4 tabs if well tolerated. Take with food. 200 tablet 0   anastrozole (ARIMIDEX) 1 MG tablet Take 1 tablet (1 mg total) by mouth daily. 90 tablet 3   Current Facility-Administered Medications  Medication Dose Route Frequency Provider Last Rate Last Admin   0.9 %  sodium chloride infusion   Intravenous PRN Eileen [place], PA-C       Facility-Administered Medications Ordered in Other Visits  Medication Dose Route Frequency Provider Last Rate Last Admin   sodium chloride flush (NS) 0.9 % injection 10 mL  10 mL Intravenous PRN Nicholas Lose, MD   10 mL at 09/02/19 0839    PHYSICAL EXAMINATION: ECOG PERFORMANCE STATUS: 1 - Symptomatic but completely ambulatory  Vitals:   12/15/20 1037  BP: 134/78  Pulse: 78  Resp: 18  Temp: 97.7 F (36.5 C)  SpO2: 100%   Filed Weights   12/15/20 1037  Weight: 189 lb 4.8 oz (85.9 kg)    LABORATORY DATA:  I have reviewed the  data as listed CMP Latest Ref Rng & Units 08/24/2020 07/13/2020 06/01/2020  Glucose 70 - 99 mg/dL 104(H) 102(H) 115(H)  BUN 6 - 20 mg/dL _0 Creatinine 0.44 - 1.00 mg/dL 0.70 0.72 0.72  Sodium 135 - 145 mmol/L 142 142 141  Potassium 3.5 - 5.1 mmol/L 4.0 4.0 4.1  Chloride 98 - 111 mmol/L 105 107 108  CO2 22 - 32 mmol/L _1 Calcium 8.9 - 10.3 mg/dL 9.2 8.7(L) 8.8(L)  Total Protein 6.5 - 8.1 g/dL 6.8 6.6 6.7  Total Bilirubin 0.3 - 1.2 mg/dL 0.7 0.5 0.4  Alkaline Phos 38 - 126 U/L 113 120 122  AST 15 - 41 U/L _2 ALT 0 - 44 U/L _3 Lab Results  Component Value Date   WBC 4.9 08/24/2020   HGB 11.9 (L) 08/24/2020   HCT 36.0 08/24/2020   MCV 87.8 08/24/2020   PLT 207 08/24/2020   NEUTROABS 3.3 08/24/2020    ASSESSMENT & PLAN:  Malignant neoplasm of upper-outer quadrant of right breast in female, estrogen receptor positive ([place]) 07/28/2019:Screening mammogram detected  right breast mass 11:30 position subareolar 1.3 cm, indeterminate 5 mm mass: Biopsy fibroadenoma, right axillary lymph node present, biopsy of the lymph node and the mass revealed grade 2 IDC ER 10%, PR 0%, Ki-67 45%, HER-2 +3+ by IHC  T1 cN1 M0 stage IIa   Treatment plan: 1. Neoadjuvant chemotherapy with TCH Perjeta 6 cycles completed 12/16/19 (Perjeta D/Ced due to diarrhea) followed by Herceptin maintenance for 1 year 2. 01/14/2020: Right lumpectomy: Benign, no evidence of residual cancer, 0/4 lymph nodes negative, additional margins also negative, complete pathologic response (pathologic complete response) completed Herceptin 08/24/2020 3. Followed by adjuvant radiation therapy completed 03/30/2020 4.  Antiestrogen therapy with anastrozole 1 mg daily x5 years starting 07/13/2020   Breast MRI: 3.8 cm tumor and 1 axillary lymph node.  ---------------------------------------------------------------------------------------------------------------------------------------------------- Treatment plan:  Anastrozole 1 mg daily started 07/13/2020    Echocardiogram 05/31/20: EF 65-70 % Trigger finger surgery: Successful    Anastrozole toxicities: Denies any adverse effects to anastrozole therapy.   Neratinib: Counseled her about the pros and cons of neratinib.  I sent a prescription to the pharmacy.  She will start 3 tablets a day for 2 weeks and then she will go up to 4 tablets a day for a month and if she is still tolerating it well we will go up to 5 tablets a day.  Left arm pain and swelling, bilateral lower extremity swelling: With a history of blood clots we decided to obtain ultrasound of the left arm as well as bilateral lower extremities.  If the lower extremities do not have any blood clots and she will start taking Lasix.   Breast cancer surveillance: Mammogram 08/12/2020: Benign breast density category B   Return to clinic in 2 weeks for pharmacy counseling    Orders Placed This Encounter  Procedures   CBC with Differential ([place] Only)    Standing Status:   Future    Standing Expiration Date:   12/15/2021   CMP ([place] only)    Standing Status:   Future    Standing Expiration Date:   12/15/2021    The patient has a good understanding of the overall plan. she agrees with it. she will call with any problems that may develop before the next visit here.  Total time spent: 30 mins including face to face time and time spent for planning, charting and coordination of care  Rulon Eisenmenger, MD, MPH 12/15/2020  I, Thana Ates, am acting as scribe for Dr. Nicholas Lose.  I have reviewed the above documentation for accuracy and completeness, and I agree with the above.

## 2020-12-14 NOTE — Telephone Encounter (Signed)
Pt called with c/o left elbow swelling and bil LE swelling of legs, ankles and feet and left elbow swelling and tenderness. Pt w/ h/o LLE DVT prior to cancer dx.  Pt scheduled to see [name] on 9/15 for assessment. Referral placed for PT.

## 2020-12-15 ENCOUNTER — Other Ambulatory Visit (HOSPITAL_COMMUNITY): Payer: Self-pay

## 2020-12-15 ENCOUNTER — Other Ambulatory Visit: Payer: Self-pay | Admitting: *Deleted

## 2020-12-15 ENCOUNTER — Inpatient Hospital Stay: Payer: BC Managed Care – PPO | Attending: Hematology and Oncology | Admitting: Hematology and Oncology

## 2020-12-15 ENCOUNTER — Encounter: Payer: Self-pay | Admitting: Hematology and Oncology

## 2020-12-15 ENCOUNTER — Other Ambulatory Visit: Payer: Self-pay

## 2020-12-15 ENCOUNTER — Telehealth: Payer: Self-pay

## 2020-12-15 DIAGNOSIS — C50411 Malignant neoplasm of upper-outer quadrant of right female breast: Secondary | ICD-10-CM

## 2020-12-15 DIAGNOSIS — M79602 Pain in left arm: Secondary | ICD-10-CM | POA: Insufficient documentation

## 2020-12-15 DIAGNOSIS — C773 Secondary and unspecified malignant neoplasm of axilla and upper limb lymph nodes: Secondary | ICD-10-CM | POA: Insufficient documentation

## 2020-12-15 DIAGNOSIS — M7989 Other specified soft tissue disorders: Secondary | ICD-10-CM

## 2020-12-15 DIAGNOSIS — Z170 Estrogen receptor positive status [ER+]: Secondary | ICD-10-CM | POA: Diagnosis not present

## 2020-12-15 DIAGNOSIS — Z9221 Personal history of antineoplastic chemotherapy: Secondary | ICD-10-CM | POA: Diagnosis not present

## 2020-12-15 DIAGNOSIS — Z923 Personal history of irradiation: Secondary | ICD-10-CM | POA: Insufficient documentation

## 2020-12-15 MED ORDER — NERATINIB MALEATE 40 MG PO TABS
120.0000 mg | ORAL_TABLET | Freq: Every day | ORAL | 0 refills | Status: DC
  Filled 2020-12-15: qty 200, 66d supply, fill #0

## 2020-12-15 NOTE — Telephone Encounter (Signed)
Oral Oncology Pharmacist Encounter  Received new prescription for neratinib (Nerlynx) for the treatment of stage IIA HR+/HER2+ breast cancer as monotherapy for extended adjuvant setting planned duration until disease progression or up to 1 year.   Spoke to MD to clarify medication instructions for ramp up of medication.  Clarified instructions:  Weeks 1-2: 3 tablets (178m) Weeks 3-6: 4 tablets (1653m Then will increase if tolerated.    Labs from 12/15/2020 not assessed as labs have not resulted. Previous labs from 08/24/20- CBC and CMP assessed, no interventions needed.  Current medication list in Epic reviewed, no DDIs identified. Patient was started on rivaroxaban on 12/16/2020 which does have a category B interaction that may increase the concentration of rivaroxaban for dvt. Will counsel patient on monitoring for additional bruising/ bleeding.   Evaluated chart and no patient barriers to medication adherence noted.   Patient agreement for treatment documented in MD note on 12/15/2020.  [facility].  Oral Oncology Clinic will continue to follow for insurance authorization, copayment issues, initial counseling and start date.  KaDrema HalonPharmD Hematology/Oncology Clinical Pharmacist [phone]/15/2022 11:17 AM

## 2020-12-15 NOTE — Assessment & Plan Note (Signed)
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 Neoadjuvant chemotherapy with TCH Perjeta 6 cyclescompleted 12/16/19 (Perjeta D/Ced due to diarrhea)followed by Herceptin maintenance for 1 year 2. 01/14/2020: Right lumpectomy: Benign, no evidence of residual cancer, 0/4 lymph nodes negative, additional margins also negative, complete pathologic response(pathologic complete response) 3. Followed by adjuvant radiation therapycompleted 03/30/2020 4.Antiestrogen therapy with anastrozole 1 mg daily x5 years starting 07/13/2020  Breast MRI: 3.8 cm tumor and 1 axillary lymphnode. ---------------------------------------------------------------------------------------------------------------------------------------------------- Treatment plan:Continue with Herceptin maintenanceevery 3 weeks.  (Therese last Herceptin treatment)   Acid reflux with a dry cough:onPepcid.   Echocardiogram3/1/22: EF 65-70 % Trigger finger surgery: Successful  Anastrozole toxicities: Denies any adverse effects to anastrozole therapy.  Neratinib:  Breast cancer surveillance: Mammogram 08/12/2020: Benign breast density category B  Return to clinic in 2 weeks for pharmacy counseling 

## 2020-12-15 NOTE — Telephone Encounter (Signed)
Oral Oncology Patient Advocate Encounter   Received notification from [facility] that prior authorization for Nerlynx is required.   PA submitted on CoverMyMeds Key [id number] Status is pending   Oral Oncology Clinic will continue to follow.  [facility] [place] Phone [phone] Fax [phone] 12/15/2020 11:22 AM

## 2020-12-16 ENCOUNTER — Ambulatory Visit (HOSPITAL_COMMUNITY)
Admission: RE | Admit: 2020-12-16 | Discharge: 2020-12-16 | Disposition: A | Discharge status: Home / Self Care | Payer: BC Managed Care – PPO | Source: Ambulatory Visit | Attending: Hematology and Oncology | Admitting: Hematology and Oncology

## 2020-12-16 ENCOUNTER — Other Ambulatory Visit: Payer: Self-pay | Admitting: *Deleted

## 2020-12-16 DIAGNOSIS — M7989 Other specified soft tissue disorders: Secondary | ICD-10-CM | POA: Diagnosis not present

## 2020-12-16 DIAGNOSIS — Z170 Estrogen receptor positive status [ER+]: Secondary | ICD-10-CM

## 2020-12-16 DIAGNOSIS — C50411 Malignant neoplasm of upper-outer quadrant of right female breast: Secondary | ICD-10-CM | POA: Insufficient documentation

## 2020-12-16 MED ORDER — RIVAROXABAN (XARELTO) VTE STARTER PACK (15 & 20 MG): ORAL_TABLET | ORAL | 0 refills | Status: DC

## 2020-12-16 MED ORDER — NERATINIB MALEATE 40 MG PO TABS: ORAL_TABLET | ORAL | 0 refills | Status: DC

## 2020-12-16 NOTE — Progress Notes (Signed)
LUE and BLE venous duplexes have been completed.  Preliminary findings given to [name].  Results can be found under chart review under CV PROC. 12/16/2020 10:13 AM Kris Burd RVT, RDMS

## 2020-12-16 NOTE — Telephone Encounter (Signed)
Dr Lindi Adie notified patient of DVT in left arm and left leg. Prescription sent for Xarelto starter pack

## 2020-12-21 NOTE — Progress Notes (Signed)
Patient is a 55 year old female with PMH of right breast neoplasm s/p left breast mastopexy and bilateral breast fat grafting performed 11/29/2020 by Dr. Claudia Desanctis who presents to clinic for postoperative appointment.  Patient was last seen here in clinic on 12/07/2020.  Physical exam was reassuring.  Plan was for continued compression garments and activity modification.  I reviewed her medical record and she recently had DVT imaging obtained by her oncologist, [name], that was notable for acute DVT involving left peroneal veins as well as right brachial vein.  Patient tells me today that she is currently taking Eliquis for her acute thromboses.  She reports that her left arm swelling has improved considerably.  She states that the imaging report that noted it to be a right brachial vein was incorrect and that her left arm is the side affected.  She understands that this is a complicated DVT that could have warranted admission, but that it is being well managed by her oncologist.  She adamantly denies any chest pain, shortness of breath, palpitations, fever/chills, or hemoptysis.  She feels as though most of the fat grafting has settled inferiorly and that she thinks she needs more in superior aspect of breasts bilaterally.  She understands that she will need a few more months for the breast to settle out before there will be any further surgical intervention.  Patient also says she has a small area of swelling in right lower quadrant of her abdomen.  On physical exam, there is a small firm area, possible fat necrosis.  No fluctuance.  Only mildly tender.  No guarding.  No abdominal distention.  No overlying skin changes.  Patient also has mild firmness in medial aspect right breast, again suspect possible fat necrosis.  Chaperone was present for exam.  Recommended that she massage out each of the affected areas.  She understands that these will typically soften over 6 to 12 months.  Otherwise she is doing  quite well.   Patient to return to clinic in 5 to 6 weeks to discuss potential future surgical options, or sooner if needed.  She can call should she develop any questions or concerns.

## 2020-12-21 NOTE — Telephone Encounter (Addendum)
Oral Chemotherapy Pharmacist Encounter  I spoke with patient for overview of: Nerlynx (neratinib) for the maintenance adjuvant treatment of Her-2 receptor positive, hormone receptor-positive breast cancer, after completion of Herceptin, planned duration one year.  Last Herceptin infusion: 08/24/2020   Counseled patient on administration, dosing, side effects, monitoring, drug-food interactions, safe handling, storage, and disposal.  Prescribing information for Nerlynx recommends dose initiation at full dose, 6 tablets (240 mg) once daily, with the use of scheduled loperamide antidiarrheal prophylaxis for the first 8 weeks of therapy.  Nerlynx will be initiated on a dose titration schedule. Antidiarrheal prophylaxis will not be used.   Patient will use loperamide as needed if symptoms of diarrhea occur.   Week 1-2: Patient will take Nerlynx 47m tablets, 3 tablets (1227m by mouth once daily with food Week 3-6: Patient will take Nerlynx 4069mablets, 4 tablets (200m86my mouth once daily with food Week 7: Medication increase will be determined by physician if patient is tolerating.  240 mg once daily is the target dose of Nerlynx.  Patient knows to avoid grapefruit and grapefruit juice while on treatment with Nerlynx.  Patient instructed to avoid use of PPIs or H2RAs while on treatment with Nerlynx, can separate antacids from Nerlynx by 3 hours if acid suppression is needed.  Nerlynx start date: 12/22/2020 Patient states the medication is set to arrive in the mail tomorrow morning and she will start at that time but if it arrives tomorrow night then she will start Friday morning.  Adverse effects include but are not limited to: diarrhea, nausea, vomiting, mouth sores, fatigue, rash, and abdominal pain.    Patient instructed to increase or decrease loperamide dose to maintain 1-2 bowel movements per day.  Reviewed with patient importance of keeping a medication schedule and plan for any  missed doses. No barriers to medication adherence identified.  Medication reconciliation performed and medication/allergy list updated.  Insurance authorization for Nerlynx has been obtained. [facility] the [facility] will reach out 5-7 days prior to needing next fill of Nerlynx to coordinate continued medication acquisition to prevent break in therapy.  All questions answered.  Mrs. StevGerilyn Nestleced understanding and appreciation.   Patient is aware that she has an upcoming appointment next week with my colleague, JohnJenny Reichmann a follow up appointment.   Medication education handout placed in mail for patient. Patient knows to call the office with questions or concerns. Oral Chemotherapy Clinic phone number provided to patient.   KaitDrema HalonarmD Hematology/Oncology Clinical Pharmacist [facility]-[phone]

## 2020-12-22 ENCOUNTER — Ambulatory Visit: Payer: BC Managed Care – PPO | Attending: Hematology and Oncology | Admitting: Physical Therapy

## 2020-12-22 ENCOUNTER — Other Ambulatory Visit: Payer: Self-pay

## 2020-12-22 ENCOUNTER — Encounter: Payer: Self-pay | Admitting: Physical Therapy

## 2020-12-22 DIAGNOSIS — Z170 Estrogen receptor positive status [ER+]: Secondary | ICD-10-CM | POA: Insufficient documentation

## 2020-12-22 DIAGNOSIS — C50411 Malignant neoplasm of upper-outer quadrant of right female breast: Secondary | ICD-10-CM | POA: Insufficient documentation

## 2020-12-22 DIAGNOSIS — R293 Abnormal posture: Secondary | ICD-10-CM | POA: Insufficient documentation

## 2020-12-22 NOTE — Therapy (Deleted)
[place], [place], [address] Phone: [phone]   Fax:  [phone]  Patient Details  Name: Stephanie Brewer MRN: [id number] Date of Birth: 1965-04-28 Referring Provider:  Nicholas Lose, MD  Encounter Date: 12/22/2020   Allyson Sabal [facility] 12/22/2020, 3:26 PM  [place], [place], [address] Phone: [phone]   Fax:  [phone]

## 2020-12-22 NOTE — Therapy (Signed)
[place], [place], [address] Phone: [phone]   Fax:  [phone]  Physical Therapy Treatment  Patient Details  Name: Stephanie Brewer MRN: [id number] Date of Birth: 01-Oct-1965 No data recorded  Encounter Date: 12/22/2020   PT End of Session - 12/22/20 1522     Visit Number 2   number unchanged due to screen only   Number of Visits 2    PT Start Time 8280    PT Stop Time 1520    PT Time Calculation (min) 22 min    Activity Tolerance Patient tolerated treatment well    Behavior During Therapy [facility] for tasks assessed/performed             Past Medical History:  Diagnosis Date   Cancer ([place])    DVT (deep venous thrombosis) ([facility])    left leg knee and ankle    GERD (gastroesophageal reflux disease)     Past Surgical History:  Procedure Laterality Date   BREAST IMPLANT REMOVAL Bilateral 01/14/2020   Procedure: REMOVAL BREAST IMPLANTS;  Surgeon: Cindra Presume, MD;  Location: [place];  Service: Plastics;  Laterality: Bilateral;   BREAST LUMPECTOMY WITH RADIOACTIVE SEED AND SENTINEL LYMPH NODE BIOPSY Right 01/14/2020   Procedure: Right breast seed localized lumpectomy with sentinel lymph node biopsy;  Surgeon: Rolm Bookbinder, MD;  Location: [place];  Service: General;  Laterality: Right;   BREAST SURGERY     Augmentation 2003   HERNIA REPAIR  2002   LIPOSUCTION WITH LIPOFILLING Bilateral 11/29/2020   Procedure: LIPOSUCTION WITH LIPOFILLING FROM ABDOMEN TO BILATERAL BREASTS;  Surgeon: Cindra Presume, MD;  Location: [place];  Service: Plastics;  Laterality: Bilateral;   MASTOPEXY Left 11/29/2020   Procedure: LEFT MASTOPEXY;  Surgeon: Cindra Presume, MD;  Location: [place];  Service: Plastics;  Laterality: Left;   PORTACATH PLACEMENT N/A 09/01/2019   Procedure: INSERTION PORT-A-CATH WITH ULTRASOUND GUIDANCE;  Surgeon:  Rolm Bookbinder, MD;  Location: [place];  Service: General;  Laterality: N/A;   TRIGGER FINGER RELEASE Left 05/09/2020   Procedure: RELEASE TRIGGER FINGER/A-1 PULLEY LEFT LONG;  Surgeon: Leanora Cover, MD;  Location: [place];  Service: Orthopedics;  Laterality: Left;    There were no vitals filed for this visit.   Subjective Assessment - 12/22/20 1459     Subjective My legs would swell sometimes during treatment when I had to have fluids. They just started swelling out of the blue. I developed a DVT in my left leg and in my left arm. The swelling is going down since I started the xarelto.    Pertinent History R breast cancer, grade 2 invasive ductal carcinoma, ER +, PR-, HER2+, beginning chemo next week 09/02/19, 01/15/20- R lumpectomy and SLNB (4 nodes all negative), will begin radiation 02/15/20,  hx of LLE DVT following foot surgery for bunion, acute DVT in LUE and in LLE diagnosed on 12/16/20    Patient Stated Goals to decrease swelling                    L-DEX FLOWSHEETS - 12/22/20 1500       L-DEX LYMPHEDEMA SCREENING   Measurement Type Unilateral    L-DEX MEASUREMENT EXTREMITY Upper Extremity    POSITION  Standing    DOMINANT SIDE Left    At Risk Side Right    BASELINE SCORE (UNILATERAL) 1.4    L-DEX  SCORE (UNILATERAL) 1.2    VALUE CHANGE (UNILAT) -0.2                                     PT Long Term Goals - 02/03/20 1448       PT LONG TERM GOAL #1   Title Pt will demonstrate she has regained full shoulder ROM and function post operatively compared to baselines.    Time 8    Period Weeks    Status Achieved                   Plan - 12/22/20 1522     Clinical Impression Statement Pt initially arrived to be evaluation for a L upper extremity swelling and bilateral LE swelling. Pt was referred prior to finding out she had DVTs in her L arm and L LE. Pt has been on xarelto for about a week and  reports her swelling has improved greatly. She still has some minimal swelling but it is improving. Her breast cancer was on her R side so this does not increase her risk of lymphedema. Her 3 month SOZO screening was completed today and her L dex score was 1.2. Her baseline was 1.4 so she has had a decrease of 0.2 from baseline. Will send message to pt's doctor explaining why pt was not evaluated today. Scheduled pt to be seen again for SOZO in another 3 months.    PT Next Visit Plan Cont every 3 month L-Dex screens for up to 2 years from her SLNB.    Consulted and Agree with Plan of Care Patient             Patient will benefit from skilled therapeutic intervention in order to improve the following deficits and impairments:     Visit Diagnosis: Abnormal posture  Malignant neoplasm of upper-outer quadrant of right breast in female, estrogen receptor positive ([place])     Problem List Patient Active Problem List   Diagnosis Date Noted   Port-A-Cath in place 09/09/2019   Malignant neoplasm of upper-outer quadrant of right breast in female, estrogen receptor positive ([place]) 08/20/2019   Radicular leg pain 02/04/2018   Numbness and tingling 02/04/2018   Foot pain, left 02/04/2018   Displacement of breast implant 12/24/2017   H/O bilateral breast implants 12/24/2017   GERD (gastroesophageal reflux disease) 12/06/2015   Gluten intolerance 12/06/2015    Allyson Sabal [place], PT 12/22/2020, 3:26 PM  [address] [place], [place], [address] Phone: [phone]   Fax:  [phone]  Name: Stephanie Brewer MRN: [id number] Date of Birth: 29-Nov-1965   Manus Gunning, PT 12/22/20 3:26 PM

## 2020-12-23 ENCOUNTER — Ambulatory Visit (INDEPENDENT_AMBULATORY_CARE_PROVIDER_SITE_OTHER): Payer: BC Managed Care – PPO | Admitting: Physician Assistant

## 2020-12-23 DIAGNOSIS — Z170 Estrogen receptor positive status [ER+]: Secondary | ICD-10-CM

## 2020-12-23 DIAGNOSIS — C50411 Malignant neoplasm of upper-outer quadrant of right female breast: Secondary | ICD-10-CM

## 2020-12-26 ENCOUNTER — Ambulatory Visit: Payer: BC Managed Care – PPO

## 2020-12-28 ENCOUNTER — Other Ambulatory Visit: Payer: Self-pay

## 2020-12-28 ENCOUNTER — Inpatient Hospital Stay: Payer: BC Managed Care – PPO

## 2020-12-28 ENCOUNTER — Inpatient Hospital Stay: Payer: BC Managed Care – PPO | Admitting: Pharmacist

## 2020-12-28 DIAGNOSIS — Z170 Estrogen receptor positive status [ER+]: Secondary | ICD-10-CM | POA: Diagnosis not present

## 2020-12-28 DIAGNOSIS — C50411 Malignant neoplasm of upper-outer quadrant of right female breast: Secondary | ICD-10-CM | POA: Diagnosis not present

## 2020-12-28 DIAGNOSIS — C773 Secondary and unspecified malignant neoplasm of axilla and upper limb lymph nodes: Secondary | ICD-10-CM | POA: Diagnosis not present

## 2020-12-28 DIAGNOSIS — M7989 Other specified soft tissue disorders: Secondary | ICD-10-CM | POA: Diagnosis not present

## 2020-12-28 DIAGNOSIS — Z923 Personal history of irradiation: Secondary | ICD-10-CM | POA: Diagnosis not present

## 2020-12-28 DIAGNOSIS — Z9221 Personal history of antineoplastic chemotherapy: Secondary | ICD-10-CM | POA: Diagnosis not present

## 2020-12-28 DIAGNOSIS — M79602 Pain in left arm: Secondary | ICD-10-CM | POA: Diagnosis not present

## 2020-12-28 LAB — CMP (CANCER CENTER ONLY)
ALT: 8 U/L (ref 0–44)
AST: 12 U/L — ABNORMAL LOW (ref 15–41)
Albumin: 3.5 g/dL (ref 3.5–5.0)
Alkaline Phosphatase: 115 U/L (ref 38–126)
Anion gap: 11 (ref 5–15)
BUN: 10 mg/dL (ref 6–20)
CO2: 24 mmol/L (ref 22–32)
Calcium: 9.4 mg/dL (ref 8.9–10.3)
Chloride: 105 mmol/L (ref 98–111)
Creatinine: 0.83 mg/dL (ref 0.44–1.00)
GFR, Estimated: >60 mL/min (ref >60)
Glucose, Bld: 120 mg/dL — ABNORMAL HIGH (ref 70–99)
Potassium: 4 mmol/L (ref 3.5–5.1)
Sodium: 140 mmol/L (ref 135–145)
Total Bilirubin: 0.7 mg/dL (ref 0.3–1.2)
Total Protein: 7 g/dL (ref 6.5–8.1)

## 2020-12-28 LAB — CBC WITH DIFFERENTIAL (CANCER CENTER ONLY)
Abs Immature Granulocytes: 0.02 10*3/uL (ref 0.00–0.07)
Basophils Absolute: 0.1 10*3/uL (ref 0.0–0.1)
Basophils Relative: 1 %
Eosinophils Absolute: 0.3 10*3/uL (ref 0.0–0.5)
Eosinophils Relative: 5 %
HCT: 37 % (ref 36.0–46.0)
Hemoglobin: 12.2 g/dL (ref 12.0–15.0)
Immature Granulocytes: 0 %
Lymphocytes Relative: 16 %
Lymphs Abs: 0.8 10*3/uL (ref 0.7–4.0)
MCH: 29.4 pg (ref 26.0–34.0)
MCHC: 33 g/dL (ref 30.0–36.0)
MCV: 89.2 fL (ref 80.0–100.0)
Monocytes Absolute: 0.5 10*3/uL (ref 0.1–1.0)
Monocytes Relative: 10 %
Neutro Abs: 3.6 10*3/uL (ref 1.7–7.7)
Neutrophils Relative %: 68 %
Platelet Count: 226 10*3/uL (ref 150–400)
RBC: 4.15 MIL/uL (ref 3.87–5.11)
RDW: 13.5 % (ref 11.5–15.5)
WBC Count: 5.3 10*3/uL (ref 4.0–10.5)
nRBC: 0 % (ref 0.0–0.2)

## 2020-12-28 MED ORDER — NERATINIB MALEATE 40 MG PO TABS: 160.0000 mg | ORAL_TABLET | Freq: Every day | ORAL | 0 refills | Status: DC

## 2020-12-28 NOTE — Progress Notes (Signed)
Stephanie Brewer at [facility] [phone]    [phone]   Oncology Clinical Pharmacist Practitioner Initial Assessment  Stephanie Brewer is a 55 y.o. female with a diagnosis of triple positive breast cancer.  Indication/Regimen Neratinib (Nerlynx) is being used appropriately for treatment of breast cancer by Dr. Nicholas Lose. Stephanie Brewer is also on anastrozole.   Wt Readings from Last 1 Encounters:  12/28/20 188 lb 6.4 oz (85.5 kg)    Estimated body surface area is 1.96 meters squared as calculated from the following:   Height as of this encounter: 5\' 4"  (1.626 m).   Weight as of this encounter: 188 lb 6.4 oz (85.5 kg).  The current dosing regimen for neratinib is 3 tabs (120 mg) by mouth daily on days 1 to 28 of a 28-day cycle. Per [name], she will be on this dose for 2 weeks, then if tolerated, increase to 4 tabs for 2 months, then increase to 5 tabs and then potentially full dose 6 tabs (240 mg) if tolerated. It is planned to continue until  1 year. Start date was 12/24/20  . She is currently receiving the neratinib from [facility]. She will start 4 tabs daily on 01/07/21.  At that point, she should have 48 tablets left since she was originally given 90 tablets with this prescription from [facility].  The 48 tablets at 4 tabs a day should give her an additional 12 days of medication. Will send 18 days of medication which will equal a 30 day supply.At 4 tabs a day, this comes out to 72 tablets. Stephanie Brewer is not experiencing any diarrhea or side effects at this time.  She has loperamide on hand and was instructed again on how to take this on an as needed basis.  She also has prochlorperazine and ondansetron on hand should she have any nausea.  She will have labs monthly for the first 3 months, then every 3 months thereafter or as clinically indicated.  Labs today are acceptable to continue treatment with neratinib.  She continues on rivaroxaban for  a left peroneal DVT and left popliteal DVT seen on imaging on 12/16/20.  Reviewed instructions on this agent as well.  Stephanie Brewer states the swelling has come down considerably since starting rivaroxaban.    Her fasting blood glucose today is 120 mg/dL (100 to 125 mg/dL) which puts her at a Prediabetes stage based on the ADA criteria.  She will follow up with her PCP to discuss best options for management but we did discuss high glycemic index foods and some possible diet modifications.    Dose Modifications As above, neratinib will slowly be titrated as tolerated   Allergies Allergies  Allergen Reactions   Hyoscyamine Anaphylaxis   Doxycycline     Racing heart.   Nexium [Esomeprazole]     Swollen throat    Laboratory Data CBC EXTENDED Latest Ref Rng & Units 12/28/2020 08/24/2020 07/13/2020  WBC 4.0 - 10.5 K/uL 5.3 4.9 3.6(L)  RBC 3.87 - 5.11 MIL/uL 4.15 4.10 4.15  HGB 12.0 - 15.0 g/dL 12.2 11.9(L) 12.0  HCT 36.0 - 46.0 % 37.0 36.0 36.5  PLT 150 - 400 K/uL 226 207 203  NEUTROABS 1.7 - 7.7 K/uL 3.6 3.3 2.1  LYMPHSABS 0.7 - 4.0 K/uL 0.8 0.8 0.7    CMP Latest Ref Rng & Units 12/28/2020 08/24/2020 07/13/2020  Glucose 70 - 99 mg/dL 120(H) 104(H) 102(H)  BUN 6 - 20 mg/dL 10 10 9  Creatinine 0.44 - 1.00 mg/dL 0.83 0.70 0.72  Sodium 135 - 145 mmol/L 140 142 142  Potassium 3.5 - 5.1 mmol/L 4.0 4.0 4.0  Chloride 98 - 111 mmol/L 105 105 107  CO2 22 - 32 mmol/L 24 27 25   Calcium 8.9 - 10.3 mg/dL 9.4 9.2 8.7(L)  Total Protein 6.5 - 8.1 g/dL 7.0 6.8 6.6  Total Bilirubin 0.3 - 1.2 mg/dL 0.7 0.7 0.5  Alkaline Phos 38 - 126 U/L 115 113 120  AST 15 - 41 U/L 12(L) 15 18  ALT 0 - 44 U/L 8 8 13     Contraindications Contraindications were reviewed? Yes Contraindications to therapy were identified? No   Safety Precautions The following safety precautions for the use of neratinib were reviewed:  Diarrhea: has loperamide on hand and reviewed as needed instructions Liver toxicity: reviewed possible  side effects such as RUQ pain, yellowing of skin/eyes Drug interactions: PPIs (avoid), H2RAs, antacids discussed how to space these agents out from neratinib Nausea / Vomiting: has ondansetron and prochlorperazine on hand  Medication Reconciliation Current Outpatient Medications  Medication Sig Dispense Refill   anastrozole (ARIMIDEX) 1 MG tablet Take 1 tablet (1 mg total) by mouth daily. 90 tablet 3   calcium-vitamin D (OSCAL WITH D) 500-200 MG-UNIT tablet Take 1 tablet by mouth in the morning and at bedtime.     Neratinib Maleate (NERLYNX) 40 MG tablet Take 3 tablets by mouth daily for two weeks and then increase as directed by MD. Take with food. (Patient not taking: Reported on 12/23/2020) 90 tablet 0   ondansetron (ZOFRAN) 8 MG tablet Take 1 tablet (8 mg total) by mouth 3 (three) times daily as needed (Nausea or vomiting). 30 tablet 1   prochlorperazine (COMPAZINE) 10 MG tablet Take 1 tablet (10 mg total) by mouth every 6 (six) hours as needed (Nausea or vomiting). 30 tablet 1   RIVAROXABAN (XARELTO) VTE STARTER PACK (15 & 20 MG) Follow package directions: Take one 15mg  tablet by mouth twice a day. On day 22, switch to one 20mg  tablet once a day. Take with food. 51 each 0   No current facility-administered medications for this visit.   Facility-Administered Medications Ordered in Other Visits  Medication Dose Route Frequency Provider Last Rate Last Admin   sodium chloride flush (NS) 0.9 % injection 10 mL  10 mL Intravenous PRN Nicholas Lose, MD   10 mL at 09/02/19 7829    Medication reconciliation is based on the patient's most recent medication list in the electronic medical record (EMR) including herbal products and OTC medications.   The patient's medication list was reviewed today with the patient? Yes   Drug-drug interactions (DDIs) DDIs were evaluated? Yes DDIs identified? No   Drug-Food Interactions Drug-food interactions were evaluated? Yes Drug-food interactions  identified?  Avoid grapefruit juice  Follow-up Plan  Labs, visit with me, in one month Labs, visit with [name] on 02/22/21, then labs every 3 months with visit thereafter if tolerating Sending new prescription to increase dose of neratinib to 4 tabs (160 mg) daily with food starting 01/07/21 Stephanie Brewer will follow up with her PCP regarding her fasting blood glucose levels from today She will continue on rivaroxaban for DVT  [name] participated in the discussion, expressed understanding, and voiced agreement with the above plan. All questions were answered to her satisfaction. The patient was advised to contact the clinic at [phone] with any questions or concerns prior to her return visit.   I  spent 30 minutes assessing the patient.  [name], RPH-CPP, 12/28/2020 9:18 AM

## 2020-12-29 NOTE — Telephone Encounter (Signed)
Oral Oncology Patient Advocate Encounter  Prior Authorization for Nerlynx has been approved.    PA# [id number] Effective dates: 12/15/20 through 12/14/21  Patient must fill at [facility] will continue to follow.   [place] [facility] Phone [phone] Fax [phone] 12/29/2020 10:04 AM

## 2021-01-04 ENCOUNTER — Other Ambulatory Visit: Payer: Self-pay | Admitting: Hematology and Oncology

## 2021-01-10 ENCOUNTER — Other Ambulatory Visit: Payer: Self-pay | Admitting: Hematology and Oncology

## 2021-01-10 ENCOUNTER — Other Ambulatory Visit: Payer: Self-pay | Admitting: *Deleted

## 2021-01-10 MED ORDER — RIVAROXABAN 20 MG PO TABS: 20.0000 mg | ORAL_TABLET | Freq: Every day | ORAL | 1 refills | Status: DC

## 2021-01-11 DIAGNOSIS — C44519 Basal cell carcinoma of skin of other part of trunk: Secondary | ICD-10-CM | POA: Diagnosis not present

## 2021-01-11 DIAGNOSIS — D485 Neoplasm of uncertain behavior of skin: Secondary | ICD-10-CM | POA: Diagnosis not present

## 2021-01-11 DIAGNOSIS — X32XXXD Exposure to sunlight, subsequent encounter: Secondary | ICD-10-CM | POA: Diagnosis not present

## 2021-01-11 DIAGNOSIS — Z1283 Encounter for screening for malignant neoplasm of skin: Secondary | ICD-10-CM | POA: Diagnosis not present

## 2021-01-11 DIAGNOSIS — L570 Actinic keratosis: Secondary | ICD-10-CM | POA: Diagnosis not present

## 2021-01-11 DIAGNOSIS — D235 Other benign neoplasm of skin of trunk: Secondary | ICD-10-CM | POA: Diagnosis not present

## 2021-01-11 DIAGNOSIS — D225 Melanocytic nevi of trunk: Secondary | ICD-10-CM | POA: Diagnosis not present

## 2021-01-18 ENCOUNTER — Other Ambulatory Visit: Payer: Self-pay | Admitting: Hematology and Oncology

## 2021-01-18 MED ORDER — NERATINIB MALEATE 40 MG PO TABS: 160.0000 mg | ORAL_TABLET | Freq: Every day | ORAL | 0 refills | Status: DC

## 2021-01-19 ENCOUNTER — Telehealth: Payer: Self-pay | Admitting: Pharmacist

## 2021-01-19 NOTE — Telephone Encounter (Signed)
  [place]       Telephone: [phone]?Fax: [phone]   Oncology Clinical Pharmacist Practitioner Note  Stephanie Brewer is a 55 y.o. female with a diagnosis of breast cancer currently on adjuvant neratinib under the care of Dr. Nicholas Lose  Interval History Stephanie Brewer called yesterday and left a voicemail and myChart message.  She was running low on her neratinib prescription because [facility] has only been sending out a 15-day supply at one time although prescriptions have been for 30-day supply. MyChart message was received and [name] sent a new prescription to [facility] yesterday.  Stephanie Brewer has contacted [facility] to ensure that they send out her medication in time.  She sees clinical pharmacy on 01/25/21 with labs and toxicity check  Follow-Up Plan Neratinib prescription sent by [name] on 01/18/21 [name] will contact [facility] again today to make sure they are shipping out 160 mg dose of neratinib Labs, visit with clinical pharmacy, toxicity check on 01/25/21 Labs, visit with [name], toxicity check on 02/20/21  Stephanie Brewer participated in the discussion, expressed understanding, and voiced agreement with the above plan. All questions were answered to her satisfaction. The patient was advised to contact the clinic at [phone] with any questions or concerns prior to her return visit.  Clinical pharmacy will continue to support Stephanie Brewer and [name] as needed.  [name], RPH-CPP,  01/19/2021  2:25 PM

## 2021-01-21 ENCOUNTER — Other Ambulatory Visit: Payer: Self-pay | Admitting: Hematology and Oncology

## 2021-01-24 ENCOUNTER — Inpatient Hospital Stay: Payer: BC Managed Care – PPO | Attending: Hematology and Oncology

## 2021-01-24 ENCOUNTER — Inpatient Hospital Stay: Payer: BC Managed Care – PPO | Admitting: Pharmacist

## 2021-01-24 ENCOUNTER — Other Ambulatory Visit: Payer: Self-pay

## 2021-01-24 DIAGNOSIS — C50411 Malignant neoplasm of upper-outer quadrant of right female breast: Secondary | ICD-10-CM | POA: Diagnosis not present

## 2021-01-24 DIAGNOSIS — Z170 Estrogen receptor positive status [ER+]: Secondary | ICD-10-CM | POA: Diagnosis not present

## 2021-01-24 LAB — CBC WITH DIFFERENTIAL (CANCER CENTER ONLY)
Abs Immature Granulocytes: 0.01 10*3/uL (ref 0.00–0.07)
Basophils Absolute: 0 10*3/uL (ref 0.0–0.1)
Basophils Relative: 1 %
Eosinophils Absolute: 0.3 10*3/uL (ref 0.0–0.5)
Eosinophils Relative: 7 %
HCT: 36.9 % (ref 36.0–46.0)
Hemoglobin: 11.9 g/dL — ABNORMAL LOW (ref 12.0–15.0)
Immature Granulocytes: 0 %
Lymphocytes Relative: 23 %
Lymphs Abs: 0.9 10*3/uL (ref 0.7–4.0)
MCH: 28.1 pg (ref 26.0–34.0)
MCHC: 32.2 g/dL (ref 30.0–36.0)
MCV: 87 fL (ref 80.0–100.0)
Monocytes Absolute: 0.4 10*3/uL (ref 0.1–1.0)
Monocytes Relative: 10 %
Neutro Abs: 2.2 10*3/uL (ref 1.7–7.7)
Neutrophils Relative %: 59 %
Platelet Count: 298 10*3/uL (ref 150–400)
RBC: 4.24 MIL/uL (ref 3.87–5.11)
RDW: 13.2 % (ref 11.5–15.5)
WBC Count: 3.7 10*3/uL — ABNORMAL LOW (ref 4.0–10.5)
nRBC: 0 % (ref 0.0–0.2)

## 2021-01-24 LAB — CMP (CANCER CENTER ONLY)
ALT: 10 U/L (ref 0–44)
AST: 13 U/L — ABNORMAL LOW (ref 15–41)
Albumin: 3.4 g/dL — ABNORMAL LOW (ref 3.5–5.0)
Alkaline Phosphatase: 98 U/L (ref 38–126)
Anion gap: 8 (ref 5–15)
BUN: 13 mg/dL (ref 6–20)
CO2: 26 mmol/L (ref 22–32)
Calcium: 9.4 mg/dL (ref 8.9–10.3)
Chloride: 106 mmol/L (ref 98–111)
Creatinine: 0.81 mg/dL (ref 0.44–1.00)
GFR, Estimated: >60 mL/min (ref >60)
Glucose, Bld: 100 mg/dL — ABNORMAL HIGH (ref 70–99)
Potassium: 4.6 mmol/L (ref 3.5–5.1)
Sodium: 140 mmol/L (ref 135–145)
Total Bilirubin: 0.5 mg/dL (ref 0.3–1.2)
Total Protein: 6.9 g/dL (ref 6.5–8.1)

## 2021-01-24 MED ORDER — NERATINIB MALEATE 40 MG PO TABS: ORAL_TABLET | ORAL | 0 refills | Status: DC

## 2021-01-24 NOTE — Progress Notes (Signed)
[place]       Telephone: [phone]?Fax: [phone]   Oncology Clinical Pharmacist Practitioner Progress Note  Stephanie Brewer was contacted via in-person visit to discuss her chemotherapy regimen for neratinib which she receives under the care of Dr. Nicholas Lose.   Current treatment regimen and start date Adjuvant neratinib with anastrozole (start date 12/24/20)  Interval History She continues on neratinib 160 mg by mouth  daily on days 1 to 30 of a 30-day cycle. This is being given  in combination with anastrozole . Therapy is planned to continue until  one year of neratinib therapy in the adjuvant setting .   Response to Therapy Stephanie Brewer is doing well.  Clinical pharmacy last saw her on 12/28/20.  She is currently on 160 mg of neratinib and experiencing minor diarrhea and some food taste alterations.  She plans on increasing her dose to 200 mg on Saturday 01/28/21.  She will continue with this dose until she sees [name] on 02/20/21. She did have a few mouth sores which have resolved.  The diarrhea has been controlled by loperamide and she has not had the need to use Lomotil. She receives a 15 day supply from [facility] on Saturday and we have sent a new prescription to [facility] that she can start once this prescription runs out. She has an appointment with her PCP on 02/14/21 to discuss her fasting blood glucose levels which have come down to 100 mg/dL today. Labs were reviewed today and she is within treatment parameters to continue neratinib at this time.  Allergies Allergies  Allergen Reactions   Hyoscyamine Anaphylaxis   Doxycycline     Racing heart.   Nexium [Esomeprazole]     Swollen throat    Vitals Vitals:   01/24/21 1100  BP: 136/75  Pulse: 79  Resp: 18  Temp: (!) 97.5 F (36.4 C)  SpO2: 99%    Laboratory Data CBC EXTENDED Latest Ref Rng & Units 01/24/2021 12/28/2020 08/24/2020  WBC 4.0 - 10.5 K/uL 3.7(L) 5.3 4.9  RBC 3.87 - 5.11 MIL/uL  4.24 4.15 4.10  HGB 12.0 - 15.0 g/dL 11.9(L) 12.2 11.9(L)  HCT 36.0 - 46.0 % 36.9 37.0 36.0  PLT 150 - 400 K/uL 298 226 207  NEUTROABS 1.7 - 7.7 K/uL 2.2 3.6 3.3  LYMPHSABS 0.7 - 4.0 K/uL 0.9 0.8 0.8    CMP Latest Ref Rng & Units 01/24/2021 12/28/2020 08/24/2020  Glucose 70 - 99 mg/dL 100(H) 120(H) 104(H)  BUN 6 - 20 mg/dL 13 10 10   Creatinine 0.44 - 1.00 mg/dL 0.81 0.83 0.70  Sodium 135 - 145 mmol/L 140 140 142  Potassium 3.5 - 5.1 mmol/L 4.6 4.0 4.0  Chloride 98 - 111 mmol/L 106 105 105  CO2 22 - 32 mmol/L 26 24 27   Calcium 8.9 - 10.3 mg/dL 9.4 9.4 9.2  Total Protein 6.5 - 8.1 g/dL 6.9 7.0 6.8  Total Bilirubin 0.3 - 1.2 mg/dL 0.5 0.7 0.7  Alkaline Phos 38 - 126 U/L 98 115 113  AST 15 - 41 U/L 13(L) 12(L) 15  ALT 0 - 44 U/L 10 8 8      Adverse Effects Assessment Diarrhea: a few episodes controlled with loperamide Food taste alterations: had during chemotherapy, continuing with certain foods. No issues with appetite Mouth sores: resolved  Adherence Assessment [name] reports missing 0 doses over the past 4 weeks.   Reason for missed dose: N/A Patient was re-educated on importance of adherence.  Access Assessment Stephanie Brewer is currently receiving her neratinib through  [employer] concerns:  None  Medication Reconciliation The patient's medication list was reviewed today with the patient? Yes New medications or herbal supplements have recently been started? No , she may start a short course of minocycline per her dermatologist for recurring facial acne. Not currently taking Any medications have been discontinued? No  The medication list was updated and reconciled based on the patient's most recent medication list in the electronic medical record (EMR) including herbal products and OTC medications.   Medications Current Outpatient Medications  Medication Sig Dispense Refill   anastrozole (ARIMIDEX) 1 MG tablet Take 1 tablet (1 mg total)  by mouth daily. 90 tablet 3   calcium-vitamin D (OSCAL WITH D) 500-200 MG-UNIT tablet Take 1 tablet by mouth in the morning and at bedtime.     Neratinib Maleate (NERLYNX) 40 MG tablet Take 4 tablets (160 mg total) by mouth daily. Take with food. Start once current prescription runs out. 72 tablet 0   [START ON 01/28/2021] Neratinib Maleate (NERLYNX) 40 MG tablet Take 5 tablets (200 mg) by mouth daily starting 01/28/21. Take with food. 150 tablet 0   ondansetron (ZOFRAN) 8 MG tablet Take 1 tablet (8 mg total) by mouth 3 (three) times daily as needed (Nausea or vomiting). 30 tablet 1   prochlorperazine (COMPAZINE) 10 MG tablet Take 1 tablet (10 mg total) by mouth every 6 (six) hours as needed (Nausea or vomiting). 30 tablet 1   rivaroxaban (XARELTO) 20 MG TABS tablet Take 1 tablet (20 mg total) by mouth daily with supper. 30 tablet 1   No current facility-administered medications for this visit.   Facility-Administered Medications Ordered in Other Visits  Medication Dose Route Frequency Provider Last Rate Last Admin   sodium chloride flush (NS) 0.9 % injection 10 mL  10 mL Intravenous PRN Nicholas Lose, MD   10 mL at 09/02/19 0839    Drug-Drug Interactions (DDIs) DDIs were evaluated? Yes Significant DDIs? No  The patient was instructed to speak with their health care provider and/or the oral chemotherapy pharmacist before starting any new drug, including prescription or over the counter, natural / herbal products, or vitamins.  Supportive Care Continue to use loperamide PRN for neratinib induced diarrhea. Instructed to contact clinic if symptoms get worse. Has Lomotil on hand Continue to monitor for fevers and any new side effects  Patient Education/Re-education The patient was educated regarding the following supportive care topics:   Diarrhea Fever Taste alterations  Dosing Assessment Hepatic adjustments needed? No  Renal adjustments needed? No  Toxicity adjustments needed? No  The  current dosing regimen is appropriate to continue at this time.  Follow-Up Plan Increase neratinib on 01/28/21 to 5 tablets (200 mg) daily and continue until next visit with [name] on 02/20/21 Contact clinic with any new or worsening symptoms Follow up with PCP on 02/14/21 regarding fasting blood glucose levels 02/20/21: labs and visit with [name] (already scheduled) 03/23/21: labs and visit with pharmacy clinic. May go to every 3 month labs after this visit if tolerating neratinib  [name] participated in the discussion, expressed understanding, and voiced agreement with the above plan. All questions were answered to her satisfaction. The patient was advised to contact the clinic at [phone] with any questions or concerns prior to her return visit.   I spent 30 minutes assessing and educating the patient.  [name], RPH-CPP, 01/24/2021  11:50 AM

## 2021-01-24 NOTE — Telephone Encounter (Signed)
Hi Stephanie Brewer, we received this refill request today.  I know you are meeting with pt today can you please either approve this or deny and send in new dose if dose will be titrated.  Thanks!

## 2021-01-25 DIAGNOSIS — D485 Neoplasm of uncertain behavior of skin: Secondary | ICD-10-CM | POA: Diagnosis not present

## 2021-01-25 DIAGNOSIS — D225 Melanocytic nevi of trunk: Secondary | ICD-10-CM | POA: Diagnosis not present

## 2021-01-25 DIAGNOSIS — L988 Other specified disorders of the skin and subcutaneous tissue: Secondary | ICD-10-CM | POA: Diagnosis not present

## 2021-01-26 ENCOUNTER — Encounter: Payer: Self-pay | Admitting: Hematology and Oncology

## 2021-02-07 DIAGNOSIS — L905 Scar conditions and fibrosis of skin: Secondary | ICD-10-CM | POA: Diagnosis not present

## 2021-02-07 DIAGNOSIS — D485 Neoplasm of uncertain behavior of skin: Secondary | ICD-10-CM | POA: Diagnosis not present

## 2021-02-08 ENCOUNTER — Other Ambulatory Visit: Payer: Self-pay

## 2021-02-08 ENCOUNTER — Ambulatory Visit (INDEPENDENT_AMBULATORY_CARE_PROVIDER_SITE_OTHER): Payer: BC Managed Care – PPO | Admitting: Plastic Surgery

## 2021-02-08 DIAGNOSIS — Z170 Estrogen receptor positive status [ER+]: Secondary | ICD-10-CM

## 2021-02-08 DIAGNOSIS — C50411 Malignant neoplasm of upper-outer quadrant of right female breast: Secondary | ICD-10-CM

## 2021-02-08 NOTE — Progress Notes (Signed)
Patient presents little over 2 months out from left mastopexy and bilateral breast fat grafting.  She overall feels good about her breast surgeries thus far.  She does believe she would be interested in a little bit more volume in the upper pole.  She is not interested in an implant.  She feels that the left side is slightly higher than the right but that is getting better with time.  She did have a couple firm nodules after the fat grafting but feels that there reduced in size quite a bit.  On exam she has good shape size and symmetry.  It is reasonable to do additional fat grafting in the upper pole at some point in the future if she is interested in that.  She is undergoing some additional chemotherapy so we will allow those effects to dissipate prior to doing any additional procedures.  She is content with this and we will see her again in 6 months.  All of her questions were answered.

## 2021-02-13 ENCOUNTER — Encounter: Payer: Self-pay | Admitting: Hematology and Oncology

## 2021-02-14 ENCOUNTER — Encounter: Payer: Self-pay | Admitting: Family Medicine

## 2021-02-14 ENCOUNTER — Other Ambulatory Visit: Payer: Self-pay

## 2021-02-14 ENCOUNTER — Ambulatory Visit (INDEPENDENT_AMBULATORY_CARE_PROVIDER_SITE_OTHER): Payer: BC Managed Care – PPO | Admitting: Family Medicine

## 2021-02-14 VITALS — BP 122/74 | HR 84 | Temp 98.3°F | Ht 64.0 in | Wt 177.8 lb

## 2021-02-14 DIAGNOSIS — R739 Hyperglycemia, unspecified: Secondary | ICD-10-CM | POA: Diagnosis not present

## 2021-02-14 DIAGNOSIS — T8141XA Infection following a procedure, superficial incisional surgical site, initial encounter: Secondary | ICD-10-CM | POA: Diagnosis not present

## 2021-02-14 DIAGNOSIS — R7303 Prediabetes: Secondary | ICD-10-CM | POA: Diagnosis not present

## 2021-02-14 LAB — BAYER DCA HB A1C WAIVED: HB A1C (BAYER DCA - WAIVED): 6 % — ABNORMAL HIGH (ref 4.8–5.6)

## 2021-02-14 MED ORDER — LANCETS THIN MISC: 12 refills | Status: DC

## 2021-02-14 MED ORDER — GLUCOSE BLOOD VI STRP: ORAL_STRIP | 12 refills | Status: DC

## 2021-02-14 MED ORDER — CEFTRIAXONE SODIUM 1 G IJ SOLR
1.0000 g | Freq: Once | INTRAMUSCULAR | Status: AC
  Administered 2021-02-14: 1 g via INTRAMUSCULAR

## 2021-02-14 MED ORDER — CEPHALEXIN 500 MG PO CAPS: 500.0000 mg | ORAL_CAPSULE | Freq: Four times a day (QID) | ORAL | 0 refills | Status: AC

## 2021-02-14 NOTE — Progress Notes (Signed)
Subjective: CC: Elevated sugars PCP: [name], DO Stephanie Brewer is a 55 y.o. female presenting to clinic today for:  1.  Elevated blood glucose Patient reports that her oncologist has noticed some elevated blood sugars during fasting status anywhere between 120s and 130s.  She is currently being treated for breast cancer with Arimidex and Nerlynx.  She has history of breast surgery for cancer on right.  She is treated with Xarelto as well for history of DVT.  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  She has since been on Xarelto daily.  No reports of bleeding.  She has had reconstructive breast surgery as well with fat deposition and implants removed.  2.  Skin infection Patient reports that she thinks that she may have a skin infection at her abdomen.  She had a skin lesion removed by Dr. Nevada Crane, her dermatologist, last week and since then she has been having some oozing and bleeding.  No reports of fevers, chills.   ROS: Per HPI  Allergies  Allergen Reactions   Hyoscyamine Anaphylaxis   Doxycycline     Racing heart.   Nexium [Esomeprazole]     Swollen throat   Past Medical History:  Diagnosis Date   Cancer ([place])    DVT (deep venous thrombosis) ([facility])    left leg knee and ankle    GERD (gastroesophageal reflux disease)     Current Outpatient Medications:    anastrozole (ARIMIDEX) 1 MG tablet, Take 1 tablet (1 mg total) by mouth daily., Disp: 90 tablet, Rfl: 3   calcium-vitamin D (OSCAL WITH D) 500-200 MG-UNIT tablet, Take 1 tablet by mouth in the morning and at bedtime., Disp: , Rfl:    Neratinib Maleate (NERLYNX) 40 MG tablet, Take 5 tablets (200 mg) by mouth daily starting 01/28/21. Take with food., Disp: 150 tablet, Rfl: 0   ondansetron (ZOFRAN) 8 MG tablet, Take 1 tablet (8 mg total) by mouth 3 (three) times daily as needed (Nausea or vomiting)., Disp: 30 tablet,  Rfl: 1   prochlorperazine (COMPAZINE) 10 MG tablet, Take 1 tablet (10 mg total) by mouth every 6 (six) hours as needed (Nausea or vomiting)., Disp: 30 tablet, Rfl: 1   rivaroxaban (XARELTO) 20 MG TABS tablet, Take 1 tablet (20 mg total) by mouth daily with supper., Disp: 30 tablet, Rfl: 1 No current facility-administered medications for this visit.  Facility-Administered Medications Ordered in Other Visits:    sodium chloride flush (NS) 0.9 % injection 10 mL, 10 mL, Intravenous, PRN, Nicholas Lose, MD, 10 mL at 09/02/19 7001 Social History   Socioeconomic History   Marital status: Married    Spouse name: Not on file   Number of children: 2   Years of education: some college   Highest education level: Not on file  Occupational History   Occupation: [employer]  Tobacco Use   Smoking status: Never   Smokeless tobacco: Never  Vaping Use   Vaping Use: Never used  Substance and Sexual Activity   Alcohol use: Not Currently    Comment: rare-twice monthly or less   Drug use: No   Sexual activity: Yes    Birth control/protection: I.U.D.  Other Topics Concern   Not on file  Social History Narrative   Lives with spouse   Caffeine use: no caffeine    Left handed    Social Determinants of Health  Financial Resource Strain: Not on file  Food Insecurity: Not on file  Transportation Needs: Not on file  Physical Activity: Not on file  Stress: Not on file  Social Connections: Not on file  Intimate Partner Violence: Not on file   Family History  Problem Relation Age of Onset   Hypertension Father     Objective: Office vital signs reviewed. BP 122/74   Pulse 84   Temp 98.3 F (36.8 C)   Ht 5\' 4"  (1.626 m)   Wt 177 lb 12.8 oz (80.6 kg)   SpO2 97%   BMI 30.52 kg/m   Physical Examination:  General: Awake, alert, well nourished, No acute distress HEENT: Sclera white.  Moist mucous membranes Cardio: regular rate   Pulm: Normal work of breathing on room air Skin:  Well-circumscribed postsurgical site on the upper mid abdomen near the bra line.  She has some necrotic tissue at the borders with slow oozing of blood.  No overt purulence appreciated.  She does have tenderness and surrounding erythema. Much smaller lesion noted on the right flank without evidence of infection  Assessment/ Plan: 55 y.o. female   Elevated serum glucose - Plan: Bayer DCA Hb A1c Waived  Pre-diabetes - Plan: glucose blood test strip, Lancets Thin MISC  Infection following a procedure, superficial incisional surgical site, initial encounter - Plan: cefTRIAXone (ROCEPHIN) injection 1 g, cephALEXin (KEFLEX) 500 MG capsule  Sugar elevated on multiple metabolic panels and O9G today shows prediabetes at 6.0.  I have given her a Contour meter and we discussed reasons to check her blood sugar intervally.  Testing supplies have also been sent to pharmacy.  Instructions on how to use a point-of-care glucose meter at home was performed today by Barnett Applebaum, LPN.  I do think that the lesion on her abdomen is infected.  I gently debrided this and cut out some of the necrotic tissue.  She had a very slow ooze of blood and I did recommend continued compression.  Her wound was dressed with nonstick bandage and paper tape.  Advised to continue topical petroleum jelly and bandaging daily.  She was given a dose of Rocephin intramuscularly and Keflex will be started orally every 6 hours tomorrow.  Discussed reasons for reevaluation.  She voiced good understanding.  Would like to see her back in 6 months for sugar checkup, sooner if concerns arise  Orders Placed This Encounter  Procedures   Bayer DCA Hb A1c Waived   Meds ordered this encounter  Medications   cefTRIAXone (ROCEPHIN) injection 1 g   cephALEXin (KEFLEX) 500 MG capsule    Sig: Take 1 capsule (500 mg total) by mouth 4 (four) times daily for 7 days. Start 02/15/21    Dispense:  28 capsule    Refill:  0   glucose blood test strip    Sig: UAD  to check sugar. R73.03    Dispense:  100 each    Refill:  12   Lancets Thin MISC    Sig: UAD to check sugar. R73.03    Dispense:  100 each    Refill:  [place], [place] [phone]

## 2021-02-14 NOTE — Patient Instructions (Addendum)
Erysipelas Erysipelas is an infection that affects the skin and tissues near the surface of the skin. It causes the skin to become red, swollen, and painful. The infection is most common on the legs but may also affect other areas, such as the face. With treatment, the infection usually goes away in a few days. If not treated, the infection can spread or lead to other problems, such as collections of pus (abscesses). What are the causes? This condition is caused by bacteria. Most often, it is caused by bacteria called streptococci.  Bacteria may get into the skin through a break in the skin, such as: A cut or scrape. An incision from surgery. A burn. An insect bite. An open sore. A crack in the skin. Sometimes, it is not known how the bacteria infected the skin. What increases the risk? You are more likely to develop this condition if you: Are a young child. Are an older adult. Have a weakened disease-fighting system (immune system), such as having HIV or AIDS. Have diabetes. Drink a lot of alcohol. Had recent surgery. Have a yeast infection of the skin. Have swollen legs. What are the signs or symptoms? The infection causes a reddened area on the skin. This reddened area may: Be painful and swollen. Have a clear border around it. Feel itchy and hot. Develop blisters or abscesses. Other symptoms may include: Fever. Chills. Nausea and vomiting. Swollen glands (lymph nodes), such as in the neck. Headache. Feeling tired (fatigue). Loss of appetite. How is this diagnosed? This condition is diagnosed based on: A physical exam. Your health care provider will examine your skin closely. Your symptoms and medical history. How is this treated? This condition is treated with antibiotic medicine. Symptoms usually get better within a few days after starting antibiotics. Follow these instructions at home: Medicines Take other over-the-counter and prescription medicines only as told by  your health care provider. Take your antibiotic medicine as told by your health care provider. Do not stop taking the antibiotic even if your condition starts to improve. Ask your health care provider if it is safe for you to take medicines for pain as needed, such as acetaminophen or ibuprofen. General instructions  If the affected area is on an arm or leg, raise (elevate) the affected arm or leg above the level of your heart while you are sitting or lying down. Do not put any creams or lotions on the affected area of your skin unless your health care provider tells you to do that. Do not share bedding, towels, or washcloths (linens) with other people. Use only your own linens to prevent the infection from spreading to others. Follow instructions from your health care provider about how to take care of your wound. Make sure you: Wash your hands with soap and water for at least 20 seconds before and after you change your bandage (dressing). If soap and water are not available, use hand sanitizer. Change your dressing as told by your health care provider. Keep all follow-up visits. This is important. Contact a health care provider if: Your symptoms do not improve within 1-2 days of starting treatment. You develop new symptoms. You have a fever. You feel generally sick (malaise) with muscle aches and pains. Get help right away if: Your symptoms get worse. You develop vomiting or diarrhea that does not go away. Your red area gets larger or turns dark in color. You notice red streaks coming from the infected area. Summary Erysipelas is an infection affecting the  skin and tissues near the surface of the skin. It causes the skin to become red, swollen, and painful. This condition is caused by bacteria. Most often, it is caused by bacteria called streptococci. Bacteria may enter through a break in the skin. Sometimes, it is not known how the bacteria infected the skin. This condition is treated  with antibiotic medicine. Symptoms usually get better within a few days after starting antibiotics. This information is not intended to replace advice given to you by your health care provider. Make sure you discuss any questions you have with your health care provider. Document Revised: 08/20/2019 Document Reviewed: 08/19/2019 Elsevier Patient Education  [place].  Blood Glucose Monitoring, Adult Monitoring your blood sugar (glucose) is an important part of managing your diabetes. Blood glucose monitoring involves checking your blood glucose as often as directed and keeping a log or record of your results over time. Checking your blood glucose regularly and keeping a blood glucose log can: Help you and your health care provider adjust your diabetes management plan as needed, including your medicines or insulin. Help you understand how food, exercise, illnesses, and medicines affect your blood glucose. Let you know what your blood glucose is at any time. You can quickly find out if you have low blood glucose (hypoglycemia) or high blood glucose (hyperglycemia). Your health care provider will set individualized treatment goals for you. Your goals will be based on your age, other medical conditions you have, and how you respond to diabetes treatment. Generally, the goal of treatment is to maintain the following blood glucose levels: Before meals (preprandial): 80-130 mg/dL (4.4-7.2 mmol/L). After meals (postprandial): below 180 mg/dL (10 mmol/L). A1C level: less than 7%. Supplies needed: Blood glucose meter. Test strips for your meter. Each meter has its own strips. You must use the strips that came with your meter. A needle to prick your finger (lancet). Do not use a lancet more than one time. A device that holds the lancet (lancing device). A journal or log book to write down your results. How to check your blood glucose Checking your blood glucose  Wash your hands for at least 20  seconds with soap and water. Prick the side of your finger (not the tip) with the lancet. Do not use the same finger consecutively. Gently rub the finger until a small drop of blood appears. Follow instructions that come with your meter for inserting the test strip, applying blood to the strip, and using your blood glucose meter. Write down your result and any notes in your log. Using alternative sites Some meters allow you to use areas of your body other than your finger (alternative sites) to test your blood. The most common alternative sites are the forearm, the thigh, and the palm of your hand. Alternative sites may not be as accurate as the fingers because blood flow is slower in those areas. This means that the result you get may be delayed, and it may be different from the result that you would get from your finger. Use the finger only, and do not use alternative sites, if: You think you have hypoglycemia. You sometimes do not know that your blood glucose is getting low (hypoglycemia unawareness). General tips and recommendations Blood glucose log  Every time you check your blood glucose, write down your result. Also write down any notes about things that may be affecting your blood glucose, such as your diet and exercise for the day. This information can help you and your  health care provider: Look for patterns in your blood glucose over time. Adjust your diabetes management plan as needed. Check if your meter allows you to download your records to a computer or if there is an app for the meter. Most glucose meters store a record of glucose readings in the meter. If you have type 1 diabetes: Check your blood glucose 4 or more times a day if you are on intensive insulin therapy with multiple daily injections (MDI) or if you are using an insulin pump. Check your blood glucose: Before every meal and snack. Before bedtime. Also check your blood glucose: If you have symptoms of  hypoglycemia. After treating low blood glucose. Before doing activities that create a risk for injury, like driving or using machinery. Before and after exercise. Two hours after a meal. Occasionally between 2:00 a.m. and 3:00 a.m., as directed. You may need to check your blood glucose more often, 6-10 times per day, if: You have diabetes that is not well controlled. You are ill. You have a history of severe hypoglycemia. You have hypoglycemia unawareness. If you have type 2 diabetes: Check your blood glucose 2 or more times a day if you take insulin or other diabetes medicines. Check your blood glucose 4 or more times a day if you are on intensive insulin therapy. Occasionally, you may also need to check your glucose between 2:00 a.m. and 3:00 a.m., as directed. Also check your blood glucose: Before and after exercise. Before doing activities that create a risk for injury, like driving or using machinery. You may need to check your blood glucose more often if: Your medicine is being adjusted. Your diabetes is not well controlled. You are ill. General tips Make sure you always have your supplies with you. After you use a few boxes of test strips, adjust (calibrate) your blood glucose meter by following instructions that came with your meter. If you have questions or need help, all blood glucose meters have a 24-hour hotline phone number available that you can call. Also contact your health care provider with questions or concerns you may have. Where to find more information The American Diabetes Association: www.diabetes.org The Association of Diabetes Care & Education Specialists: www.diabeteseducator.org Contact a health care provider if: Your blood glucose is at or above 240 mg/dL (13.3 mmol/L) for 2 days in a row. You have been sick or have had a fever for 2 days or longer, and you are not getting better. You have any of the following problems for more than 6 hours: You cannot eat  or drink. You have nausea or vomiting. You have diarrhea. Get help right away if: Your blood glucose is lower than 54 mg/dL (3 mmol/L). You become confused, or you have trouble thinking clearly. You have difficulty breathing. You have moderate or large ketone levels in your urine. These symptoms may represent a serious problem that is an emergency. Do not wait to see if the symptoms will go away. Get medical help right away. Call your local emergency services (911 in the [place] Do not drive yourself to the hospital. Summary Monitoring your blood glucose is an important part of managing your diabetes. Blood glucose monitoring involves checking your blood glucose as often as directed and keeping a log or record of your results over time. Your health care provider will set individualized treatment goals for you. Your goals will be based on your age, other medical conditions you have, and how you respond to diabetes treatment. Every time you  check your blood glucose, write down your result. Also, write down any notes about things that may be affecting your blood glucose, such as your diet and exercise for the day. This information is not intended to replace advice given to you by your health care provider. Make sure you discuss any questions you have with your health care provider. Document Revised: 12/16/2019 Document Reviewed: 12/16/2019 Elsevier Patient Education  2022 [employer].

## 2021-02-15 ENCOUNTER — Inpatient Hospital Stay: Payer: BC Managed Care – PPO | Attending: Hematology and Oncology | Admitting: Pharmacist

## 2021-02-15 ENCOUNTER — Other Ambulatory Visit: Payer: Self-pay | Admitting: Hematology and Oncology

## 2021-02-15 DIAGNOSIS — Z170 Estrogen receptor positive status [ER+]: Secondary | ICD-10-CM

## 2021-02-15 DIAGNOSIS — C50411 Malignant neoplasm of upper-outer quadrant of right female breast: Secondary | ICD-10-CM | POA: Insufficient documentation

## 2021-02-15 DIAGNOSIS — R197 Diarrhea, unspecified: Secondary | ICD-10-CM | POA: Insufficient documentation

## 2021-02-15 DIAGNOSIS — Z9221 Personal history of antineoplastic chemotherapy: Secondary | ICD-10-CM | POA: Insufficient documentation

## 2021-02-15 DIAGNOSIS — Z923 Personal history of irradiation: Secondary | ICD-10-CM | POA: Insufficient documentation

## 2021-02-15 MED ORDER — NERATINIB MALEATE 40 MG PO TABS: 240.0000 mg | ORAL_TABLET | Freq: Every day | ORAL | 0 refills | Status: DC

## 2021-02-15 NOTE — Telephone Encounter (Signed)
Spoke to [name].  She is doing well and would like to increase to full dose (6 tabs) of neratinib starting 03/02/21.  Has been on 5 tablets since 01/28/21. Will send new Rx to [facility].

## 2021-02-15 NOTE — Progress Notes (Signed)
[facility]       Telephone: [phone]?Fax: [phone]   Oncology Clinical Pharmacist Practitioner Progress Note  Stephanie Brewer is a 55 y.o. female with a diagnosis of breast cancer currently on adjuvant neratinib under the care of Dr. Nicholas Lose  Clinical pharmacy received message today from team regarding refill request from [facility] on Stephanie Brewer neratinib prescription.  Contacted Stephanie Brewer via telephone.  She is doing well.  She started  5 tablets of neratinib on 01/28/21 and is tolerating it well. She has had some diarrhea but it is not constant and well controlled with loperamide.  She states the most loperamide she has needed to take is 3 tabs in a 24 hour period.  We discussed increasing to full dose (6 tabs) which [name] would like to do soon.  She will receive her next shipment of neratinib from [facility] tomorrow which is still the 5 tablets daily dose (200 mg) and so the plan will be for her to start neratinib 6 tablets (240 mg = full dose) on 03/02/21.  A prescription for this dose and start date will be sent to [facility] today.  Stephanie Brewer has labs and visit with [name] next week.  [name] made aware of our discussion.  Stephanie Brewer participated in the discussion, expressed understanding, and voiced agreement with the above plan. All questions were answered to her satisfaction. The patient was advised to contact the clinic at [phone] with any questions or concerns prior to her return visit.   Clinical pharmacy will continue to support Stephanie Brewer and [name] as needed.  Stephanie Brewer, RPH-CPP,  02/15/2021  10:50 AM

## 2021-02-19 NOTE — Progress Notes (Signed)
Patient Care Team: [name], DO as PCP - General (Family Medicine) Mauro Kaufmann, RN as Oncology Nurse Navigator Rockwell Germany, RN as Oncology Nurse Navigator Gwyndolyn Kaufman, RN (Inactive) as Registered Nurse Gwyndolyn Kaufman, RN (Inactive) as Registered Nurse  DIAGNOSIS:    ICD-10-CM   1. Malignant neoplasm of upper-outer quadrant of right breast in female, estrogen receptor positive ([place])  C50.411    Z17.0       SUMMARY OF ONCOLOGIC HISTORY: Oncology History  Malignant neoplasm of upper-outer quadrant of right breast in female, estrogen receptor positive ([place])  07/28/2019 Initial Diagnosis   Screening mammogram detected right breast mass 11:30 position subareolar 1.3 cm, indeterminate 5 mm mass: Biopsy fibroadenoma, right axillary lymph node present, biopsy of the lymph node and the mass revealed grade 2 IDC ER 10%, PR 0%, Ki-67 45%, HER-2 +3+ by [facility]   08/20/2019 Cancer Staging   Staging form: Breast, AJCC 8th Edition - Clinical stage from 08/20/2019: Stage IIA (cT2, cN1, cM0, G2, ER+, PR-, HER2+) - Signed by Eppie Gibson, MD on 01/26/2020    09/02/2019 - 12/18/2019 Chemotherapy   The patient had dexamethasone (DECADRON) 4 MG tablet, 4 mg (100 % of original dose 4 mg), Oral, Daily, 1 of 1 cycle, Start date: 08/20/2019, End date: 01/06/2020 Dose modification: 4 mg (original dose 4 mg, Cycle 0) palonosetron (ALOXI) injection 0.25 mg, 0.25 mg, Intravenous,  Once, 6 of 6 cycles Administration: 0.25 mg (09/02/2019), 0.25 mg (09/23/2019), 0.25 mg (11/25/2019), 0.25 mg (12/16/2019), 0.25 mg (10/14/2019), 0.25 mg (11/04/2019) pegfilgrastim-jmdb (FULPHILA) injection 6 mg, 6 mg, Subcutaneous,  Once, 6 of 6 cycles Administration: 6 mg (09/04/2019), 6 mg (09/25/2019), 6 mg (11/27/2019), 6 mg (12/18/2019), 6 mg (10/16/2019), 6 mg (11/06/2019) CARBOplatin (PARAPLATIN) 700 mg in sodium chloride 0.9 % 250 mL chemo infusion, 700 mg (100 % of original dose 700 mg), Intravenous,  Once, 6 of 6  cycles Dose modification: 700 mg (original dose 700 mg, Cycle 1), 700 mg (original dose 700 mg, Cycle 5), 600 mg (original dose 700 mg, Cycle 5, Reason: Other (see comments), Comment: dose reduce to 600 mg for CINV per [name]) Administration: 700 mg (09/02/2019), 700 mg (09/23/2019), 600 mg (11/25/2019), 600 mg (12/16/2019), 700 mg (10/14/2019), 600 mg (11/04/2019) DOCEtaxel (TAXOTERE) 150 mg in sodium chloride 0.9 % 250 mL chemo infusion, 75 mg/m2 = 150 mg, Intravenous,  Once, 6 of 6 cycles Dose modification: 50 mg/m2 (original dose 75 mg/m2, Cycle 5, Reason: Dose not tolerated), 50 mg/m2 (original dose 75 mg/m2, Cycle 4, Reason: Dose not tolerated) Administration: 150 mg (09/02/2019), 150 mg (09/23/2019), 100 mg (11/25/2019), 100 mg (12/16/2019), 150 mg (10/14/2019), 100 mg (11/04/2019) fosaprepitant (EMEND) 150 mg in sodium chloride 0.9 % 145 mL IVPB, 150 mg, Intravenous,  Once, 6 of 6 cycles Administration: 150 mg (09/02/2019), 150 mg (09/23/2019), 150 mg (11/25/2019), 150 mg (12/16/2019), 150 mg (10/14/2019), 150 mg (11/04/2019) pertuzumab (PERJETA) 420 mg in sodium chloride 0.9 % 250 mL chemo infusion, 420 mg (100 % of original dose 420 mg), Intravenous, Once, 5 of 5 cycles Dose modification: 420 mg (original dose 420 mg, Cycle 1, Reason: Provider Judgment) Administration: 420 mg (09/02/2019), 420 mg (09/23/2019), 420 mg (11/25/2019), 420 mg (10/14/2019), 420 mg (11/04/2019) trastuzumab-dkst (OGIVRI) 672 mg in sodium chloride 0.9 % 250 mL chemo infusion, 8 mg/kg = 672 mg, Intravenous,  Once, 6 of 6 cycles Administration: 672 mg (09/02/2019), 504 mg (09/23/2019), 504 mg (11/25/2019), 504 mg (12/16/2019), 504 mg (10/14/2019), 504  mg (11/04/2019)   for chemotherapy treatment.     01/06/2020 - 08/24/2020 Chemotherapy          01/14/2020 Surgery   Right lumpectomy Donne Hazel): no evidence of residual carcinoma, 4 right axillary lymph nodes negative for carcinoma.   02/16/2020 - 03/30/2020 Radiation Therapy   Adjuvant  radiation     CHIEF COMPLIANT: Follow-up of right breast cancer  INTERVAL HISTORY: Stephanie Brewer is a 55 y.o. with above-mentioned history of right breast cancer who completed neoadjuvant chemotherapy, underwent a right lumpectomy, radiation, and is currently on Herceptin maintenance. She presents to the clinic today for follow-up.  She has 4-5 loose stools per day but she is able to handle them.  She had multiple skin surgeries and one of them on the abdominal wall and got infected and she is currently on high-dose of Keflex antibiotic.  Her diarrhea has increased since she started Keflex.  However she tells me that Imodium helps her and she is able to manage the symptoms.  ALLERGIES:  is allergic to hyoscyamine, doxycycline, and nexium [esomeprazole].  MEDICATIONS:  Current Outpatient Medications  Medication Sig Dispense Refill   anastrozole (ARIMIDEX) 1 MG tablet Take 1 tablet (1 mg total) by mouth daily. 90 tablet 3   calcium-vitamin D (OSCAL WITH D) 500-200 MG-UNIT tablet Take 1 tablet by mouth in the morning and at bedtime.     cephALEXin (KEFLEX) 500 MG capsule Take 1 capsule (500 mg total) by mouth 4 (four) times daily for 7 days. Start 02/15/21 28 capsule 0   glucose blood test strip UAD to check sugar. R73.03 100 each 12   Lancets Thin MISC UAD to check sugar. R73.03 100 each 12   Neratinib Maleate (NERLYNX) 40 MG tablet Take 5 tablets (200 mg) by mouth daily starting 01/28/21. Take with food. 150 tablet 0   [START ON 03/02/2021] Neratinib Maleate (NERLYNX) 40 MG tablet Take 6 tablets (240 mg total) by mouth daily. Take with food. Start on 03/02/21. 180 tablet 0   ondansetron (ZOFRAN) 8 MG tablet Take 1 tablet (8 mg total) by mouth 3 (three) times daily as needed (Nausea or vomiting). 30 tablet 1   prochlorperazine (COMPAZINE) 10 MG tablet Take 1 tablet (10 mg total) by mouth every 6 (six) hours as needed (Nausea or vomiting). 30 tablet 1   rivaroxaban (XARELTO) 20 MG TABS tablet Take  1 tablet (20 mg total) by mouth daily with supper. 30 tablet 1   No current facility-administered medications for this visit.   Facility-Administered Medications Ordered in Other Visits  Medication Dose Route Frequency Provider Last Rate Last Admin   sodium chloride flush (NS) 0.9 % injection 10 mL  10 mL Intravenous PRN Nicholas Lose, MD   10 mL at 09/02/19 0839    PHYSICAL EXAMINATION: ECOG PERFORMANCE STATUS: 1 - Symptomatic but completely ambulatory  Vitals:   02/20/21 0912  BP: 138/87  Pulse: 89  Resp: 18  Temp: 97.8 F (36.6 C)  SpO2: 100%   Filed Weights   02/20/21 0912  Weight: 177 lb 6.4 oz (80.5 kg)      LABORATORY DATA:  I have reviewed the data as listed CMP Latest Ref Rng & Units 01/24/2021 12/28/2020 08/24/2020  Glucose 70 - 99 mg/dL 100(H) 120(H) 104(H)  BUN 6 - 20 mg/dL _0 Creatinine 0.44 - 1.00 mg/dL 0.81 0.83 0.70  Sodium 135 - 145 mmol/L 140 140 142  Potassium 3.5 - 5.1 mmol/L 4.6 4.0 4.0  Chloride  98 - 111 mmol/L 106 105 105  CO2 22 - 32 mmol/L _0 Calcium 8.9 - 10.3 mg/dL 9.4 9.4 9.2  Total Protein 6.5 - 8.1 g/dL 6.9 7.0 6.8  Total Bilirubin 0.3 - 1.2 mg/dL 0.5 0.7 0.7  Alkaline Phos 38 - 126 U/L 98 115 113  AST 15 - 41 U/L 13(L) 12(L) 15  ALT 0 - 44 U/L _1 Lab Results  Component Value Date   WBC 4.6 02/20/2021   HGB 12.5 02/20/2021   HCT 39.1 02/20/2021   MCV 87.5 02/20/2021   PLT 265 02/20/2021   NEUTROABS 2.9 02/20/2021    ASSESSMENT & PLAN:  Malignant neoplasm of upper-outer quadrant of right breast in female, estrogen receptor positive ([place]) 07/28/2019:Screening mammogram detected right breast mass 11:30 position subareolar 1.3 cm, indeterminate 5 mm mass: Biopsy fibroadenoma, right axillary lymph node present, biopsy of the lymph node and the mass revealed grade 2 IDC ER 10%, PR 0%, Ki-67 45%, HER-2 +3+ by IHC  T1 cN1 M0 stage IIa   Treatment plan: 1. Neoadjuvant chemotherapy with TCH Perjeta 6 cycles completed  12/16/19 (Perjeta D/Ced due to diarrhea) followed by Herceptin maintenance for 1 year 2. 01/14/2020: Right lumpectomy: Benign, no evidence of residual cancer, 0/4 lymph nodes negative, additional margins also negative, complete pathologic response (pathologic complete response) completed Herceptin 08/24/2020 3. Followed by adjuvant radiation therapy completed 03/30/2020 4.  Antiestrogen therapy with anastrozole 1 mg daily x5 years starting 07/13/2020   Breast MRI: 3.8 cm tumor and 1 axillary lymph node.  ---------------------------------------------------------------------------------------------------------------------------------------------------- Treatment plan: Anastrozole 1 mg daily started 07/13/2020, neratinib started September 2022    Echocardiogram 05/31/20: EF 65-70 % Trigger finger surgery: Successful    Anastrozole toxicities: Denies any adverse effects to anastrozole therapy. Neratinib toxicities:  Intermittent diarrhea: I instructed her not to increase more than 5 tablets of neratinib daily.  She has 4-5 loose stools per day but she is able to handle them well with the help of Imodium.  Breast cancer surveillance: Mammogram 08/12/2020: Benign breast density category B Brain MRI 10/20/2020: Unremarkable  Return to clinic in December with [name] for follow-up. I will see her in 6 months with labs and mammogram and follow-up.    No orders of the defined types were placed in this encounter.  The patient has a good understanding of the overall plan. she agrees with it. she will call with any problems that may develop before the next visit here.  Total time spent: 20 mins including face to face time and time spent for planning, charting and coordination of care  Rulon Eisenmenger, MD, MPH 02/20/2021  I, Thana Ates, am acting as scribe for Dr. Nicholas Lose.  I have reviewed the above documentation for accuracy and completeness, and I agree with the above.

## 2021-02-20 ENCOUNTER — Other Ambulatory Visit: Payer: Self-pay

## 2021-02-20 ENCOUNTER — Inpatient Hospital Stay: Payer: BC Managed Care – PPO

## 2021-02-20 ENCOUNTER — Inpatient Hospital Stay (HOSPITAL_BASED_OUTPATIENT_CLINIC_OR_DEPARTMENT_OTHER): Payer: BC Managed Care – PPO | Admitting: Hematology and Oncology

## 2021-02-20 DIAGNOSIS — C50411 Malignant neoplasm of upper-outer quadrant of right female breast: Secondary | ICD-10-CM

## 2021-02-20 DIAGNOSIS — Z923 Personal history of irradiation: Secondary | ICD-10-CM | POA: Diagnosis not present

## 2021-02-20 DIAGNOSIS — R197 Diarrhea, unspecified: Secondary | ICD-10-CM | POA: Diagnosis not present

## 2021-02-20 DIAGNOSIS — Z170 Estrogen receptor positive status [ER+]: Secondary | ICD-10-CM

## 2021-02-20 DIAGNOSIS — Z9221 Personal history of antineoplastic chemotherapy: Secondary | ICD-10-CM | POA: Diagnosis not present

## 2021-02-20 LAB — CMP (CANCER CENTER ONLY)
ALT: 13 U/L (ref 0–44)
AST: 17 U/L (ref 15–41)
Albumin: 3.7 g/dL (ref 3.5–5.0)
Alkaline Phosphatase: 119 U/L (ref 38–126)
Anion gap: 8 (ref 5–15)
BUN: 17 mg/dL (ref 6–20)
CO2: 23 mmol/L (ref 22–32)
Calcium: 9.2 mg/dL (ref 8.9–10.3)
Chloride: 110 mmol/L (ref 98–111)
Creatinine: 0.81 mg/dL (ref 0.44–1.00)
GFR, Estimated: >60 mL/min (ref >60)
Glucose, Bld: 95 mg/dL (ref 70–99)
Potassium: 3.8 mmol/L (ref 3.5–5.1)
Sodium: 141 mmol/L (ref 135–145)
Total Bilirubin: 0.3 mg/dL (ref 0.3–1.2)
Total Protein: 7 g/dL (ref 6.5–8.1)

## 2021-02-20 LAB — CBC WITH DIFFERENTIAL (CANCER CENTER ONLY)
Abs Immature Granulocytes: 0.02 10*3/uL (ref 0.00–0.07)
Basophils Absolute: 0 10*3/uL (ref 0.0–0.1)
Basophils Relative: 1 %
Eosinophils Absolute: 0.3 10*3/uL (ref 0.0–0.5)
Eosinophils Relative: 6 %
HCT: 39.1 % (ref 36.0–46.0)
Hemoglobin: 12.5 g/dL (ref 12.0–15.0)
Immature Granulocytes: 0 %
Lymphocytes Relative: 18 %
Lymphs Abs: 0.8 10*3/uL (ref 0.7–4.0)
MCH: 28 pg (ref 26.0–34.0)
MCHC: 32 g/dL (ref 30.0–36.0)
MCV: 87.5 fL (ref 80.0–100.0)
Monocytes Absolute: 0.5 10*3/uL (ref 0.1–1.0)
Monocytes Relative: 11 %
Neutro Abs: 2.9 10*3/uL (ref 1.7–7.7)
Neutrophils Relative %: 64 %
Platelet Count: 265 10*3/uL (ref 150–400)
RBC: 4.47 MIL/uL (ref 3.87–5.11)
RDW: 14.4 % (ref 11.5–15.5)
WBC Count: 4.6 10*3/uL (ref 4.0–10.5)
nRBC: 0 % (ref 0.0–0.2)

## 2021-02-20 MED ORDER — RIVAROXABAN 20 MG PO TABS: 20.0000 mg | ORAL_TABLET | Freq: Every day | ORAL | 12 refills | Status: DC

## 2021-02-20 NOTE — Assessment & Plan Note (Signed)
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 Neoadjuvant chemotherapy with TCH Perjeta 6 cyclescompleted 12/16/19 (Perjeta D/Ced due to diarrhea)followed by Herceptin maintenance for 1 year 2. 01/14/2020: Right lumpectomy: Benign, no evidence of residual cancer, 0/4 lymph nodes negative, additional margins also negative, complete pathologic response(pathologic complete response) completed Herceptin 08/24/2020 3. Followed by adjuvant radiation therapycompleted 03/30/2020 4.Antiestrogen therapy with anastrozole 1 mg daily x5 years starting 07/13/2020  Breast MRI: 3.8 cm tumor and 1 axillary lymphnode. ---------------------------------------------------------------------------------------------------------------------------------------------------- Treatment plan:Anastrozole 1 mg daily started 07/13/2020, neratinib started September 2022   Echocardiogram3/1/22: EF 65-70 % Trigger finger surgery: Successful  Anastrozoletoxicities:Denies any adverse effects to anastrozole therapy. Neratinib toxicities:   Breast cancer surveillance: Mammogram 08/12/2020: Benign breast density category B Brain MRI 10/20/2020: Unremarkable  Return to clinic in 1 month for labs and follow-up

## 2021-03-01 ENCOUNTER — Other Ambulatory Visit: Payer: Self-pay | Admitting: Hematology and Oncology

## 2021-03-01 DIAGNOSIS — C50411 Malignant neoplasm of upper-outer quadrant of right female breast: Secondary | ICD-10-CM

## 2021-03-01 NOTE — Telephone Encounter (Signed)
If you can please review and refill if needed. Thanks!

## 2021-03-02 ENCOUNTER — Encounter: Payer: Self-pay | Admitting: Hematology and Oncology

## 2021-03-02 ENCOUNTER — Inpatient Hospital Stay: Payer: BC Managed Care – PPO | Attending: Hematology and Oncology | Admitting: Pharmacist

## 2021-03-02 DIAGNOSIS — C50411 Malignant neoplasm of upper-outer quadrant of right female breast: Secondary | ICD-10-CM | POA: Insufficient documentation

## 2021-03-02 DIAGNOSIS — C773 Secondary and unspecified malignant neoplasm of axilla and upper limb lymph nodes: Secondary | ICD-10-CM | POA: Insufficient documentation

## 2021-03-02 DIAGNOSIS — Z170 Estrogen receptor positive status [ER+]: Secondary | ICD-10-CM | POA: Insufficient documentation

## 2021-03-02 NOTE — Progress Notes (Signed)
[place]       Telephone: [phone]?Fax: [phone]   Oncology Clinical Pharmacist Practitioner Progress Note  Stephanie Brewer is a 55 y.o. female with a diagnosis of breast cancer currently on adjuvant neratinib under the care of Dr. Nicholas Lose. Neratinib was started on 12/24/20.  Stephanie Brewer also receives daily anastrozole.  Interval History Clinical pharmacy received a refill request through Dr. Geralyn Flash team from [place] requesting a prescription for neratinib 6 tablets (240 mg) daily.  Reviewed last visit note that patient had with [name] on 02/24/21 and at that time [name] and Stephanie Brewer agreed to stay at neratinib 5 tablets daily due to increased diarrhea concerns with 6 tablets.  Currently having diarrhea with 5 tablets but controlled with anti-diarrheals.    Contacted Stephanie Brewer today via telephone visit and confirmed that [facility] has already sent a refill for her and she should receive in the next 2 days.  She agrees that she will stay at neratinib 5 tablets daily. She states she has enough neratinib to last her through this Sunday.  She is doing well and looking forward to her visit with me on 03/23/21.  She knows to contact the clinic in the interim for any new or worsening symptoms.  Follow-Up Plan Diarrhea controlled at neratinib 5 tablets daily.  Will not increase to 6 tablets Refill request not approved as confirmed with Stephanie Brewer that neratinib prescription already sent by [place] clinic visit on 03/23/21 with labs prior  Stephanie Brewer participated in the discussion, expressed understanding, and voiced agreement with the above plan. All questions were answered to her satisfaction. The patient was advised to contact the clinic at [phone] with any questions or concerns prior to her return visit.  Clinical pharmacy will continue to support Stephanie Brewer and [name] as needed.  I spent 15 minutes assessing the  patient.  [name], RPH-CPP,  03/02/2021  8:25 AM

## 2021-03-03 ENCOUNTER — Encounter: Payer: Self-pay | Admitting: Hematology and Oncology

## 2021-03-06 ENCOUNTER — Telehealth: Payer: Self-pay | Admitting: *Deleted

## 2021-03-06 ENCOUNTER — Inpatient Hospital Stay: Payer: BC Managed Care – PPO | Admitting: Pharmacist

## 2021-03-06 DIAGNOSIS — C50411 Malignant neoplasm of upper-outer quadrant of right female breast: Secondary | ICD-10-CM

## 2021-03-06 NOTE — Telephone Encounter (Signed)
Received call from pt stating Friday 03/03/21 pt was experiencing profound diarrhea with blood.  Pt states she called after hours MD and was recommended to stop Nerlynx and f/u with MD on Monday.  Pt states since stopping Nerlynx bloody diarrhea has resolved.  Pt denies s/s of dehydration and states she is able to tolerate p.o fluids with no issues.  RN will review with Oncology Pharmacist [name] for further recommendations and request to reach out to pt.

## 2021-03-06 NOTE — Progress Notes (Signed)
[place]       Telephone: [phone]?Fax: [phone]   Oncology Clinical Pharmacist Practitioner Progress Note  KIMBERY HARWOOD is a 55 y.o. female with a diagnosis of breast cancer currently on extended adjuvant neratinib under the care of Dr. Nicholas Lose  Interval History [name] contacted [name] on Friday evening and spoke to the on-call physician to report several episodes of loose stools with blood.  She reports that she was told to hold neratinib (last dose Friday morning) and hold rivaroxaban (Saturday only).  Spoke to [name] today via telephone.  She states that she had first bloody stool on Thursday evening after speaking to me.  She felt it was not necessary to contact anyone.  Reeducated on importance of calling clinic immediately with any new or worsening symptoms. That stool per her report looked like coffee grounds.  She then had several episodes of blood-tinged loose stool Friday (4 at work) which she then contacted Dr. Geralyn Flash office around [place] and was given instruction as stated above by on-call physician.  Her last loose stool was Saturday at 1pm and she has not had a bowel movement since that time.  She was using loperamide as needed during these episodes.  She restarted rivaroxaban per on-call physician instructions on Sunday 03/05/21. She is reporting no other signs of bleeding and no dizziness, shortness of breath, etc.  Her only other symptom was some cramping in the left leg where she has two blood clots.  Confirmed with patient that cramping is not continuing and she is having no swelling, bruising, redness to that area. Reviewed all symptoms with [name].  Will have [name] come in for labs tomorrow. She will continue to hold neratinib and if all symptoms have resolved, will restart on neratinib at 4 tablets daily next Monday.  This will be her max dose.  [name] stated that since no swelling or other symptoms of DVT, okay to monitor  left leg and [name] should call clinic immediately for any new or worsening symptoms.  She has a planned visit with clinical pharmacy with labs on 03/23/21.  Follow-Up Plan Labs tomorrow Continue to hold neratinib, will contact patient next Monday to confirm symptoms have resolved, restart at neratinib 4 tablets if tolerated, manufacturer recommend one further dose reduction if needed to 3 tablets of neratinib before discontinuing Labs / pharmacy clinic visit on 03/23/21 Ms. Maj was instructed to contact clinic immediately for any new or worsening symptoms in the interim  [name] participated in the discussion, expressed understanding, and voiced agreement with the above plan. All questions were answered to her satisfaction. The patient was advised to contact the clinic at [phone] with any questions or concerns prior to her return visit.  Clinical pharmacy will continue to support [name] and [name] as needed.  I spent 15 minutes assessing the patient.  [name], RPH-CPP,  03/06/2021  2:32 PM

## 2021-03-07 ENCOUNTER — Other Ambulatory Visit: Payer: Self-pay

## 2021-03-07 ENCOUNTER — Inpatient Hospital Stay: Payer: BC Managed Care – PPO

## 2021-03-07 DIAGNOSIS — C50411 Malignant neoplasm of upper-outer quadrant of right female breast: Secondary | ICD-10-CM | POA: Diagnosis not present

## 2021-03-07 DIAGNOSIS — Z170 Estrogen receptor positive status [ER+]: Secondary | ICD-10-CM

## 2021-03-07 DIAGNOSIS — C773 Secondary and unspecified malignant neoplasm of axilla and upper limb lymph nodes: Secondary | ICD-10-CM | POA: Diagnosis not present

## 2021-03-07 LAB — CMP (CANCER CENTER ONLY)
ALT: 11 U/L (ref 0–44)
AST: 14 U/L — ABNORMAL LOW (ref 15–41)
Albumin: 3.6 g/dL (ref 3.5–5.0)
Alkaline Phosphatase: 103 U/L (ref 38–126)
Anion gap: 9 (ref 5–15)
BUN: 17 mg/dL (ref 6–20)
CO2: 24 mmol/L (ref 22–32)
Calcium: 8.9 mg/dL (ref 8.9–10.3)
Chloride: 108 mmol/L (ref 98–111)
Creatinine: 0.87 mg/dL (ref 0.44–1.00)
GFR, Estimated: >60 mL/min (ref >60)
Glucose, Bld: 100 mg/dL — ABNORMAL HIGH (ref 70–99)
Potassium: 4.1 mmol/L (ref 3.5–5.1)
Sodium: 141 mmol/L (ref 135–145)
Total Bilirubin: 0.6 mg/dL (ref 0.3–1.2)
Total Protein: 6.8 g/dL (ref 6.5–8.1)

## 2021-03-07 LAB — CBC WITH DIFFERENTIAL (CANCER CENTER ONLY)
Abs Immature Granulocytes: 0.01 10*3/uL (ref 0.00–0.07)
Basophils Absolute: 0.1 10*3/uL (ref 0.0–0.1)
Basophils Relative: 2 %
Eosinophils Absolute: 0.2 10*3/uL (ref 0.0–0.5)
Eosinophils Relative: 5 %
HCT: 35.3 % — ABNORMAL LOW (ref 36.0–46.0)
Hemoglobin: 11.7 g/dL — ABNORMAL LOW (ref 12.0–15.0)
Immature Granulocytes: 0 %
Lymphocytes Relative: 18 %
Lymphs Abs: 0.6 10*3/uL — ABNORMAL LOW (ref 0.7–4.0)
MCH: 28.4 pg (ref 26.0–34.0)
MCHC: 33.1 g/dL (ref 30.0–36.0)
MCV: 85.7 fL (ref 80.0–100.0)
Monocytes Absolute: 0.3 10*3/uL (ref 0.1–1.0)
Monocytes Relative: 10 %
Neutro Abs: 2.3 10*3/uL (ref 1.7–7.7)
Neutrophils Relative %: 65 %
Platelet Count: 194 10*3/uL (ref 150–400)
RBC: 4.12 MIL/uL (ref 3.87–5.11)
RDW: 14.6 % (ref 11.5–15.5)
WBC Count: 3.5 10*3/uL — ABNORMAL LOW (ref 4.0–10.5)
nRBC: 0 % (ref 0.0–0.2)

## 2021-03-08 ENCOUNTER — Other Ambulatory Visit: Payer: BC Managed Care – PPO

## 2021-03-13 ENCOUNTER — Inpatient Hospital Stay: Payer: BC Managed Care – PPO | Admitting: Pharmacist

## 2021-03-13 DIAGNOSIS — Z170 Estrogen receptor positive status [ER+]: Secondary | ICD-10-CM

## 2021-03-13 DIAGNOSIS — C50411 Malignant neoplasm of upper-outer quadrant of right female breast: Secondary | ICD-10-CM

## 2021-03-13 NOTE — Progress Notes (Signed)
[place]       Telephone: [phone]?Fax: [phone]   Oncology Clinical Pharmacist Practitioner Progress Note  Stephanie Brewer is a 55 y.o. female with a diagnosis of breast cancer currently on adjuvant neratinib under the care of Dr. Nicholas Lose  Interval History Clinical pharmacy spoke to [name] today over the telephone as planned.  She had been holding the neratinib since 03/04/21 (last dose was 03/03/21 in the morning) due to bloody diarrhea.  She has not experienced any further episodes of bloody diarrhea since we last spoke on 03/06/21. Last bloody stool was 03/04/21. She has had bowel movements since that time. Her leg cramping continues but no other signs of DVT such as swelling, redness, bruising, etc.  She continues to closely monitor it since she has hx of clots in that area and currently on rivaroxaban which will continue indefinitely. She reports no other symptoms at this time.  [name] has restarted her neratinib at 4 tablets this morning and will see clinical pharmacy on 03/23/21 with labs.  She knows to contact the clinic should any diarrhea reoccur or any new/worsening symptoms.  Follow-Up Plan Restarted neratinib at 4 tablets daily on 03/13/21, can reduce to 3 tablets if needed Continue to monitor leg cramping See clinical pharmacy with labs on 03/23/21.  Stephanie Brewer participated in the discussion, expressed understanding, and voiced agreement with the above plan. All questions were answered to her satisfaction. The patient was advised to contact the clinic at [phone] with any questions or concerns prior to her return visit.  Clinical pharmacy will continue to support Stephanie Brewer and [name] as needed.  I spent 15 minutes assessing the patient.  [name], RPH-CPP,  03/13/2021  10:07 AM

## 2021-03-20 IMAGING — MR MR BREAST BILAT WO/W CM
6 of 9 series · 29 of 48 positions shown · IV contrast (gadavist)
Comparison: August 26, 2019

CLINICAL DATA: Assess treatment response.  Known breast cancer.

LABS:  None
EXAM:
BILATERAL BREAST MRI WITH AND WITHOUT CONTRAST
TECHNIQUE: Multiplanar, multisequence MR images of both breasts were obtained
prior to and following the intravenous administration of 8 ml of
Gadavist

[Series 3: T2 · axial · 3.0mm · 0.89mm/px · z∈[-79,+104]mm · 4 of 62 slices shown]
[im 1/62]
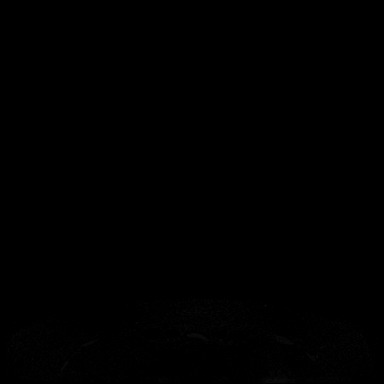
[im 21/62]
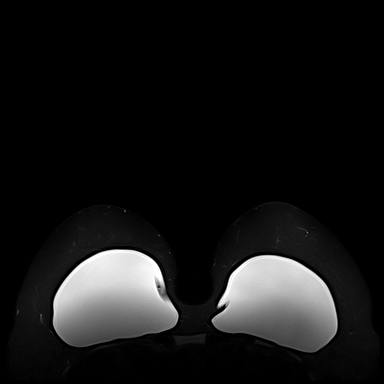
[im 41/62]
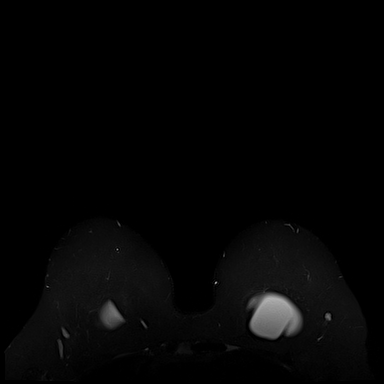
[im 62/62]
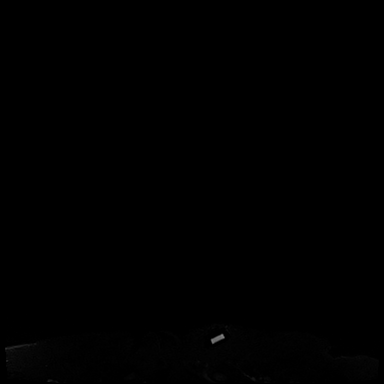

[Series 4: T1 fat-sat · axial · 1.2mm · 0.76mm/px · z∈[-83,+108]mm · 8 of 160 slices shown (1 of 4)]
[im 1/160]
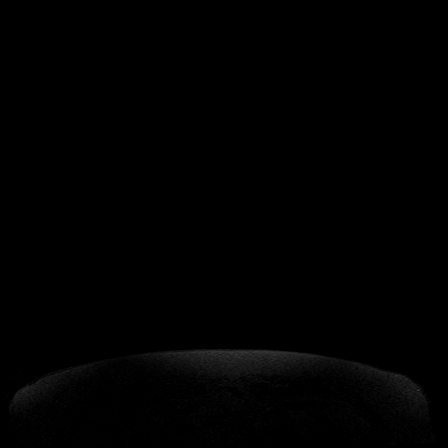
[im 23/160]
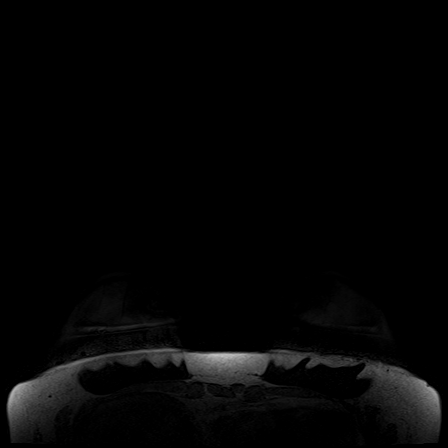
[im 46/160]
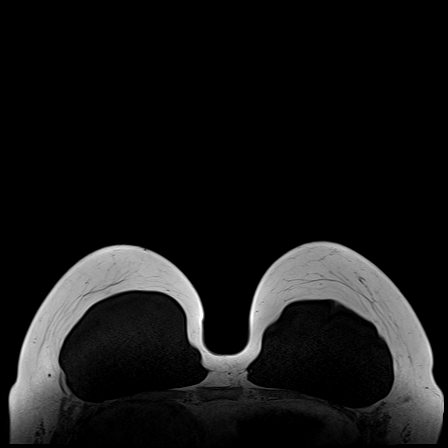
[im 69/160]
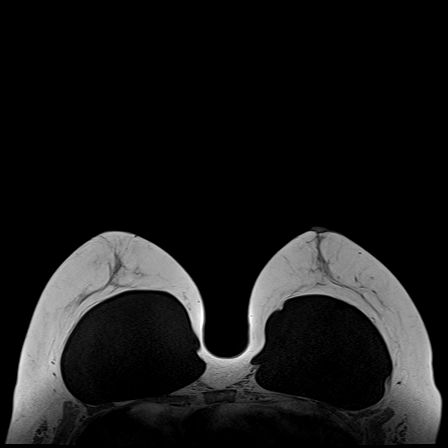
[im 91/160]
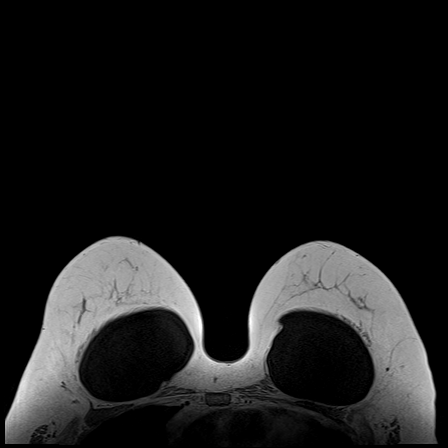
[im 114/160]
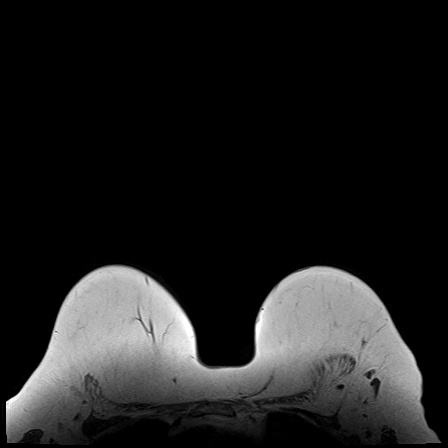
[im 137/160]
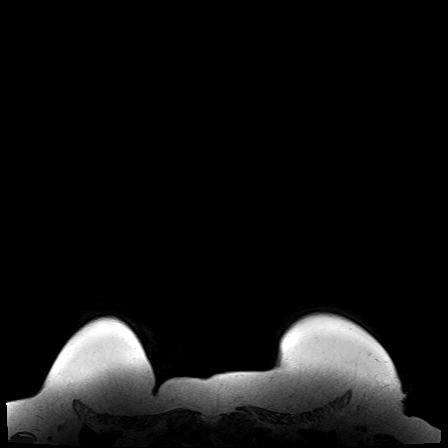
[im 160/160]
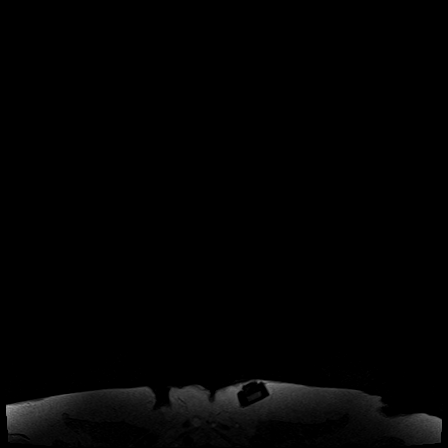

[Series 6: T1 fat-sat · axial · 1.6mm · 0.87mm/px · z∈[-89,+114]mm · 6 of 128 slices shown (2 of 4)]
[im 1/128]
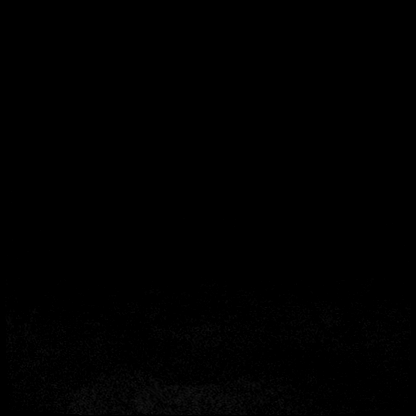
[im 26/128]
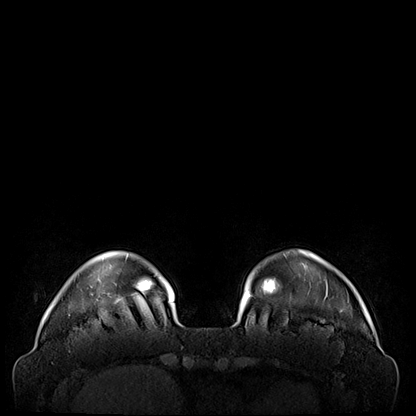
[im 51/128]
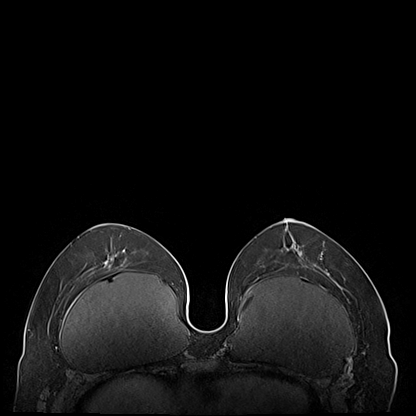
[im 77/128]
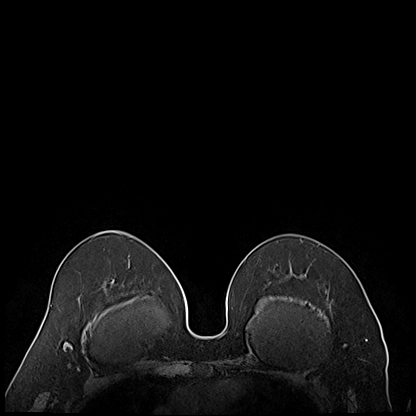
[im 102/128]
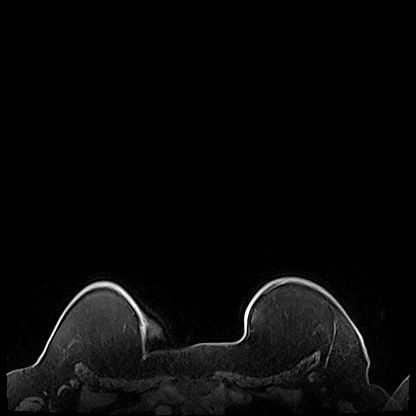
[im 128/128]
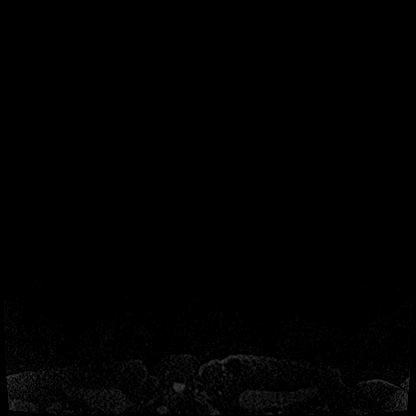

[Series 7: T1 fat-sat · axial · 1.6mm · 0.87mm/px · z∈[-89,+114]mm · 5 of 128 slices shown (3 of 4)]
[im 1/128]
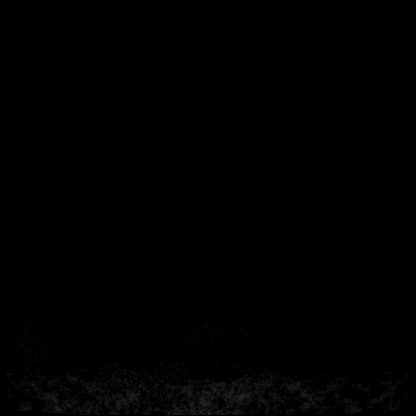
[im 32/128]
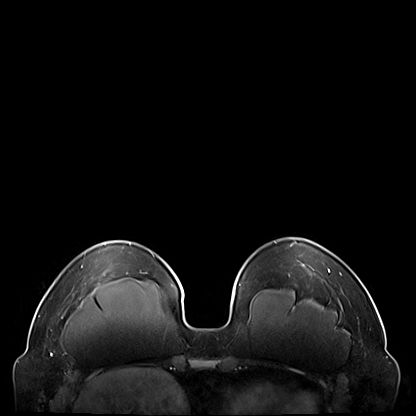
[im 64/128]
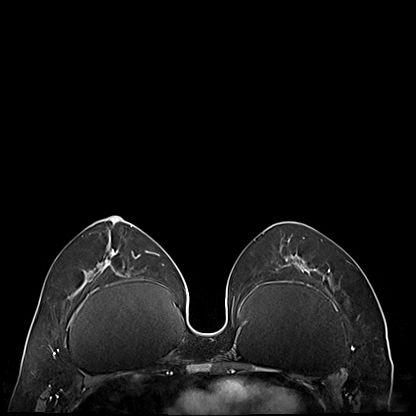
[im 96/128]
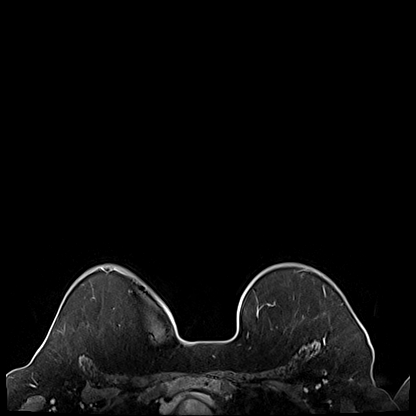
[im 128/128]
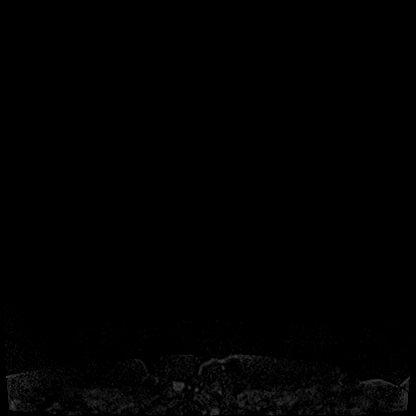

[Series 8: T1 · axial · 1.6mm · 0.87mm/px · z∈[-89,+114]mm · 5 of 128 slices shown]
[im 1/128]
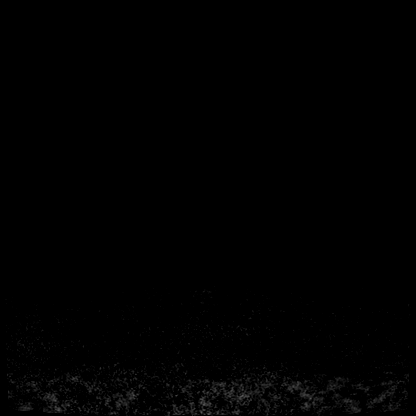
[im 32/128]
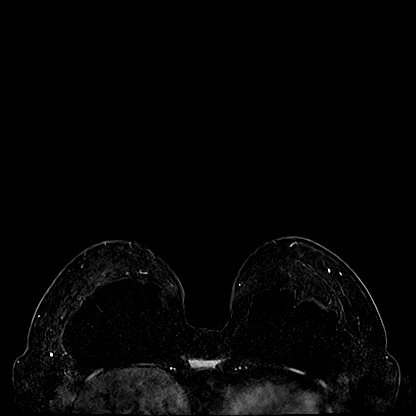
[im 64/128]
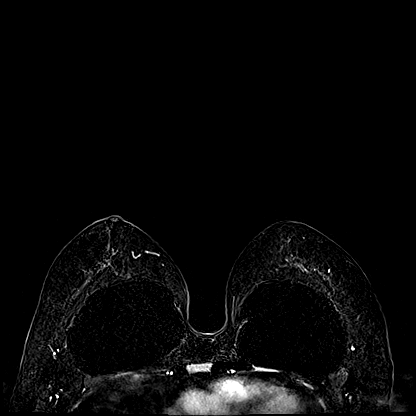
[im 96/128]
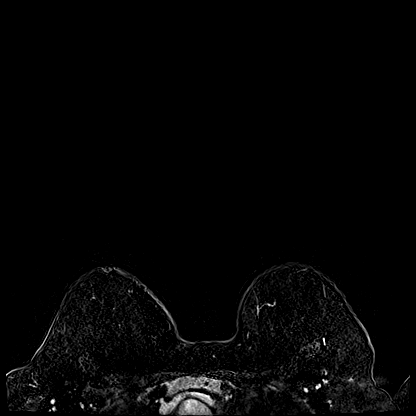
[im 128/128]
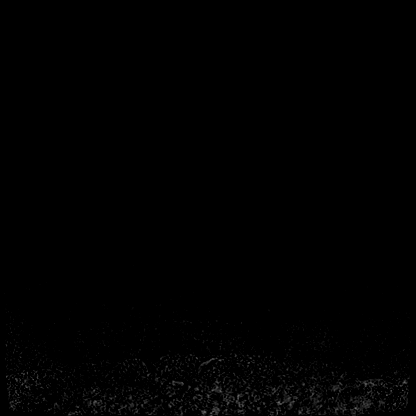

[Series 11: T1 fat-sat · axial · 1.6mm · 0.87mm/px · 1 of 128 slices shown (4 of 4)]
[im 1/128]
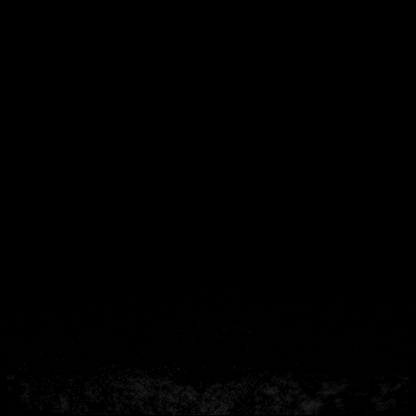

[29 of 48 positions shown; findings below may reference images not displayed]

Three-dimensional MR images were rendered by post-processing of the
original MR data on an independent workstation. The
three-dimensional MR images were interpreted, and findings are
reported in the following complete MRI report for this study. Three
dimensional images were evaluated at the independent interpreting
workstation using the DynaCAD thin client.
FINDINGS: Breast composition: b. Scattered fibroglandular tissue.

Background parenchymal enhancement: Minimal

Right breast: No mass or abnormal enhancement. The previously
identified malignancy is no longer identified with MRI. The patient
has bilateral implants.

Left breast: No mass or abnormal enhancement. The patient has
bilateral implants.

Lymph nodes: No abnormal appearing lymph nodes.

Ancillary findings:  None.
IMPRESSION: The previously identified right-sided malignancy is resolved on
today's imaging. No MRI evidence of malignancy in either breast.

RECOMMENDATION:
Recommend continued surgical and oncologic follow up.

BI-RADS CATEGORY  6: Known biopsy-proven malignancy.

## 2021-03-23 ENCOUNTER — Inpatient Hospital Stay: Payer: BC Managed Care – PPO

## 2021-03-23 ENCOUNTER — Inpatient Hospital Stay: Payer: BC Managed Care – PPO | Admitting: Pharmacist

## 2021-03-23 ENCOUNTER — Other Ambulatory Visit: Payer: Self-pay

## 2021-03-23 VITALS — BP 126/63 | HR 70 | Temp 97.5°F | Resp 18 | Ht 64.0 in | Wt 171.6 lb

## 2021-03-23 DIAGNOSIS — C773 Secondary and unspecified malignant neoplasm of axilla and upper limb lymph nodes: Secondary | ICD-10-CM | POA: Diagnosis not present

## 2021-03-23 DIAGNOSIS — Z170 Estrogen receptor positive status [ER+]: Secondary | ICD-10-CM

## 2021-03-23 DIAGNOSIS — C50411 Malignant neoplasm of upper-outer quadrant of right female breast: Secondary | ICD-10-CM

## 2021-03-23 LAB — CBC WITH DIFFERENTIAL (CANCER CENTER ONLY)
Abs Immature Granulocytes: 0.01 10*3/uL (ref 0.00–0.07)
Basophils Absolute: 0 10*3/uL (ref 0.0–0.1)
Basophils Relative: 1 %
Eosinophils Absolute: 0.2 10*3/uL (ref 0.0–0.5)
Eosinophils Relative: 5 %
HCT: 37 % (ref 36.0–46.0)
Hemoglobin: 11.9 g/dL — ABNORMAL LOW (ref 12.0–15.0)
Immature Granulocytes: 0 %
Lymphocytes Relative: 19 %
Lymphs Abs: 0.7 10*3/uL (ref 0.7–4.0)
MCH: 28.1 pg (ref 26.0–34.0)
MCHC: 32.2 g/dL (ref 30.0–36.0)
MCV: 87.3 fL (ref 80.0–100.0)
Monocytes Absolute: 0.4 10*3/uL (ref 0.1–1.0)
Monocytes Relative: 11 %
Neutro Abs: 2.3 10*3/uL (ref 1.7–7.7)
Neutrophils Relative %: 64 %
Platelet Count: 233 10*3/uL (ref 150–400)
RBC: 4.24 MIL/uL (ref 3.87–5.11)
RDW: 14.6 % (ref 11.5–15.5)
WBC Count: 3.6 10*3/uL — ABNORMAL LOW (ref 4.0–10.5)
nRBC: 0 % (ref 0.0–0.2)

## 2021-03-23 LAB — CMP (CANCER CENTER ONLY)
ALT: 13 U/L (ref 0–44)
AST: 15 U/L (ref 15–41)
Albumin: 4 g/dL (ref 3.5–5.0)
Alkaline Phosphatase: 98 U/L (ref 38–126)
Anion gap: 6 (ref 5–15)
BUN: 12 mg/dL (ref 6–20)
CO2: 28 mmol/L (ref 22–32)
Calcium: 9.4 mg/dL (ref 8.9–10.3)
Chloride: 106 mmol/L (ref 98–111)
Creatinine: 0.72 mg/dL (ref 0.44–1.00)
GFR, Estimated: >60 mL/min (ref >60)
Glucose, Bld: 103 mg/dL — ABNORMAL HIGH (ref 70–99)
Potassium: 3.9 mmol/L (ref 3.5–5.1)
Sodium: 140 mmol/L (ref 135–145)
Total Bilirubin: 0.6 mg/dL (ref 0.3–1.2)
Total Protein: 7 g/dL (ref 6.5–8.1)

## 2021-03-23 NOTE — Progress Notes (Signed)
[place]       Telephone: [phone]?Fax: [phone]   Oncology Clinical Pharmacist Practitioner Progress Note  Stephanie Brewer was contacted via in-person visit to discuss her chemotherapy regimen for adjuvant neratinib which they receive under the care of Dr. Nicholas Lose.   Current treatment regimen and start date Neratinib (12/24/20) Anastrozole (07/13/20)  Interval History She continues on neratinib 160 mg by mouth  daily on days 1 to 28 of a 28-day cycle. This is being given  in combination with anastrozole . Therapy is planned to continue until  one year from start of treatment in the extended adjuvant setting (12/24/21) .   Response to Therapy [name] is doing well.  Clinical pharmacy last saw [name] as a telephone encounter on December 22.  That morning she had started 4 tablets of neratinib and today she says she is tolerating it much better.  She is using loperamide as needed for loose stools, but the loose stools have decreased significantly.  She states now there are days where she has no loose stools at all.  Her leg cramping is also much improved.  She is having some nausea but it is mild, and she has not needed to use any antinausea medications since our last discussion.  She does note having the "taste of blood" in her mouth periodically.  When she does spit during those times she feels there is some blood mixed with saliva and it is "pink looking" she feels this started 2 weeks ago and is getting a little bit more frequent.  She is also having some jawline pain on her right side of her face near her chin.  She said it started yesterday.  She took some Tylenol yesterday and this morning with some relief.  I spoke to [name] regarding the symptoms and thought it may be best for her to examine her since she is on rivaroxaban and was having potential bleeding episodes.  He was kind enough to examine [name] today.  He feels that the pain in her  jaw may be best examined by a dentist.  He did not note anything concerning on exam.  [name] has a appointment with her dentist in February, but she will move that appointment closer to the first of the year.  Regarding the blood-tinged sputum, [name] reports no other signs of bleeding.  She is not having any bloody noses, bloody gums, blood in her urine, or blood in her stool.  Her hemoglobin is somewhat increased from our last visit.  [name] believes her symptoms may be potentially due to nasal irritation, and has recommended she use a humidifier.  Ms. Dow knows to contact the clinic immediately if the symptoms get any worse.  Discussed with [name] today about follow-up appointments going forward on neratinib.  Because she is tolerating this dose much better, clinical pharmacy will see her again in February with labs and then she will see [name] again in May with labs.  At this point if she remains stable with no new or worsening symptoms, manufacturing guidelines state that labs can be every 3 months or as clinically indicated.  Ms. Santarelli is agreeable to this plan.    Labs, vitals, treatment parameters, and manufacturer guidelines assessing toxicity were reviewed with [name] today. Based on these values, patient is in agreement to continue therapy at this time.  Allergies Allergies  Allergen Reactions   Hyoscyamine Anaphylaxis  Doxycycline     Racing heart.   Nexium [Esomeprazole]     Swollen throat    Vitals Vitals with BMI 03/23/2021 02/20/2021 02/14/2021  Height 5\' 4"  5\' 4"  5\' 4"   Weight 171 lbs 10 oz 177 lbs 6 oz 177 lbs 13 oz  BMI 29.44 09.73 53.2  Systolic 992 426 834  Diastolic 63 87 74  Pulse 70 89 84     Laboratory Data CBC EXTENDED Latest Ref Rng & Units 03/23/2021 03/07/2021 02/20/2021  WBC 4.0 - 10.5 K/uL 3.6(L) 3.5(L) 4.6  RBC 3.87 - 5.11 MIL/uL 4.24 4.12 4.47  HGB 12.0 - 15.0 g/dL 11.9(L) 11.7(L) 12.5  HCT 36.0 - 46.0 % 37.0 35.3(L)  39.1  PLT 150 - 400 K/uL 233 194 265  NEUTROABS 1.7 - 7.7 K/uL 2.3 2.3 2.9  LYMPHSABS 0.7 - 4.0 K/uL 0.7 0.6(L) 0.8    CMP Latest Ref Rng & Units 03/23/2021 03/07/2021 02/20/2021  Glucose 70 - 99 mg/dL 103(H) 100(H) 95  BUN 6 - 20 mg/dL 12 17 17   Creatinine 0.44 - 1.00 mg/dL 0.72 0.87 0.81  Sodium 135 - 145 mmol/L 140 141 141  Potassium 3.5 - 5.1 mmol/L 3.9 4.1 3.8  Chloride 98 - 111 mmol/L 106 108 110  CO2 22 - 32 mmol/L 28 24 23   Calcium 8.9 - 10.3 mg/dL 9.4 8.9 9.2  Total Protein 6.5 - 8.1 g/dL 7.0 6.8 7.0  Total Bilirubin 0.3 - 1.2 mg/dL 0.6 0.6 0.3  Alkaline Phos 38 - 126 U/L 98 103 119  AST 15 - 41 U/L 15 14(L) 17  ALT 0 - 44 U/L 13 11 13     Lab Results  Component Value Date   MG 1.6 (L) 12/04/2019   MG 1.7 11/13/2019   MG 1.9 10/26/2019     Adverse Effects Assessment Diarrhea: much improved on 4 tablets and controlled with loperamide as needed Nausea: minimal, not using any antinausea medications currently Leg cramping: much improved on neratinib 4 tablets Jaw pain: [name] feels this may be best accessed by a dentist. Ms. Mares has an appointment in February but will move this appointment up if possible.  [name] did not note anything on exam Blood tinged sputum: [name] felt that this could be nasal irritation and advised [name] to use a humidifier.  Ms. Fruchter did state her office at work is extremely dry. She is having no other bleeding that she is aware of and her hemoglobin is stable and slightly improved  Adherence Assessment [name] reports missing 0 doses over the past 2 weeks.   Reason for missed dose: N/A Patient was re-educated on importance of adherence.   Access Assessment [name] is currently receiving her neratinib through  [employer] concerns:  none  Medication Reconciliation The patient's medication list was reviewed today with the patient? Yes New medications or herbal supplements have  recently been started? No  Any medications have been discontinued? No  The medication list was updated and reconciled based on the patient's most recent medication list in the electronic medical record (EMR) including herbal products and OTC medications.   Medications Current Outpatient Medications  Medication Sig Dispense Refill   anastrozole (ARIMIDEX) 1 MG tablet Take 1 tablet (1 mg total) by mouth daily. 90 tablet 3   calcium-vitamin D (OSCAL WITH D) 500-200 MG-UNIT tablet Take 1 tablet by mouth in the morning and at bedtime.     glucose blood test strip  UAD to check sugar. R73.03 100 each 12   Lancets Thin MISC UAD to check sugar. R73.03 100 each 12   loperamide (IMODIUM) 2 MG capsule Take 2 mg by mouth as needed for diarrhea or loose stools. Take 2 tabs (4 mg) with first loose stool, then 1 tab (2 mg) with each additional loose stool as needed. Do not take more than 8 tabs (16 mg) in a 24 hour period.     Neratinib Maleate (NERLYNX) 40 MG tablet Take 5 tablets (200 mg) by mouth daily starting 01/28/21. Take with food. (Patient taking differently: 160 mg. Take 4 tablets (160 mg) by mouth daily starting 03/13/21 Take with food.) 150 tablet 0   rivaroxaban (XARELTO) 20 MG TABS tablet Take 1 tablet (20 mg total) by mouth daily with supper. 30 tablet 12   ondansetron (ZOFRAN) 8 MG tablet Take 1 tablet (8 mg total) by mouth 3 (three) times daily as needed (Nausea or vomiting). (Patient not taking: Reported on 03/23/2021) 30 tablet 1   prochlorperazine (COMPAZINE) 10 MG tablet Take 1 tablet (10 mg total) by mouth every 6 (six) hours as needed (Nausea or vomiting). (Patient not taking: Reported on 03/23/2021) 30 tablet 1   No current facility-administered medications for this visit.   Facility-Administered Medications Ordered in Other Visits  Medication Dose Route Frequency Provider Last Rate Last Admin   sodium chloride flush (NS) 0.9 % injection 10 mL  10 mL Intravenous PRN Nicholas Lose, MD    10 mL at 09/02/19 0839    Drug-Drug Interactions (DDIs) DDIs were evaluated? Yes Significant DDIs? No , discussed restarting calcium/vitamin D supplement since on anastrozole. Told her to space this out from neratinib by giving neratinib 3 hours after the antacid per manufacturing guidelines. The patient was instructed to speak with their health care provider and/or the oral chemotherapy pharmacist before starting any new drug, including prescription or over the counter, natural / herbal products, or vitamins.  Supportive Care Reviewed signs of bleeding to watch out for Discussed moving her dentist appointment up from February to evaluate her jaw pain Will order DEXA scan since on anastrozole and Ms. Weich does not believe was has been done yet  Dosing Assessment Hepatic adjustments needed? No  Renal adjustments needed? No  Toxicity adjustments needed? No  The current dosing regimen is appropriate to continue at this time.  Follow-Up Plan See clinical pharmacy in 2 months with labs See [name] in May with labs (already scheduled) DEXA scan orders placed today  [name] participated in the discussion, expressed understanding, and voiced agreement with the above plan. All questions were answered to her satisfaction. The patient was advised to contact the clinic at [phone] with any questions or concerns prior to her return visit.   I spent 30 minutes assessing and educating the patient.  [name], RPH-CPP, 03/23/2021  11:01 AM

## 2021-03-29 ENCOUNTER — Encounter: Payer: Self-pay | Admitting: Family Medicine

## 2021-03-30 ENCOUNTER — Telehealth: Payer: Self-pay | Admitting: Pharmacist

## 2021-03-30 NOTE — Telephone Encounter (Signed)
Scheduled appointment per 12/22 los. Patient is aware. 

## 2021-04-10 ENCOUNTER — Ambulatory Visit: Payer: BC Managed Care – PPO

## 2021-04-12 ENCOUNTER — Encounter: Payer: Self-pay | Admitting: Pharmacist

## 2021-04-12 DIAGNOSIS — M65311 Trigger thumb, right thumb: Secondary | ICD-10-CM | POA: Diagnosis not present

## 2021-04-14 ENCOUNTER — Telehealth: Payer: Self-pay | Admitting: Pharmacist

## 2021-04-14 DIAGNOSIS — Z170 Estrogen receptor positive status [ER+]: Secondary | ICD-10-CM

## 2021-04-14 DIAGNOSIS — C50411 Malignant neoplasm of upper-outer quadrant of right female breast: Secondary | ICD-10-CM

## 2021-04-14 MED ORDER — NERATINIB MALEATE 40 MG PO TABS: 160.0000 mg | ORAL_TABLET | Freq: Every day | ORAL | 3 refills | Status: DC

## 2021-04-14 NOTE — Telephone Encounter (Signed)
[place]       Telephone: [phone]?Fax: [phone]   Oncology Clinical Pharmacist Practitioner Progress Note  Stephanie Brewer was contacted via telephone visit to discuss her chemotherapy regimen for adjuvant neratinib which they receive under the care of Dr. Nicholas Lose.   Current treatment regimen and start date Neratinib (12/24/20) Anastrozole (07/13/20)  Interval History She continues on neratinib 160 mg (4 tablets) by mouth  daily on days 1 to 28 of a 28-day cycle. This is being given  in combination with anastrozole . Therapy is planned to continue until  one year of therapy in the adjuvant setting (tentatively 12/24/21) .   Response to Therapy Stephanie Brewer was contacted today to follow-up on a MyChart message that was sent to her earlier this week.  Dr. Geralyn Flash office had received a message from Stephanie Brewer asking about when neratinib may be due next.  Clinical pharmacy last spoke to Stephanie Brewer on December 22, and at that time she was complaining of some jaw pain.  During our visit, [name] was kind enough to examine Stephanie Brewer and advised her to see her dentist to be further evaluated.  Stephanie Brewer reports that she did contact her dentist who put her on cephalexin for possible infection on December 23, and she had an emergency root canal done on December 26 under the care of Dr. Beverely Low  She finished the cephalexin on December 30.  Stephanie Brewer reports that when she did start the cephalexin, her diarrhea did worsen on Christmas Day.  She was taking loperamide at that time with no relief but felt it was still manageable and did not contact Dr. Geralyn Flash clinic to be further evaluated.  She feels that after stopping the cephalexin, her diarrhea is back to baseline.  She normally has 2 loose stools per day on average.  We discussed that since she does have Lomotil at home, she could try to take this as needed up to 8 tablets a day to see if her loose stools  might be better managed with this agent.  She is in agreement.  We again reviewed to contact the clinic immediately for any signs of fevers, or worsening symptoms.  She next sees clinical pharmacy on February 22 with labs.  We discussed having a phone call in 2 weeks to review how she is handling the neratinib and using the Lomotil. She is in agreement.  A new prescription for neratinib 4 tablets daily will be sent to [facility] today.  Stephanie Brewer feels like she has approximately 45 tablets left from her current prescription, which would last her approximately 11 days.  No labs were reviewed today as this was a telephone visit  We did discuss that if her loose stools get worse, we should rule out a C. difficile infection, and we reviewed how antibiotic usage may increase her risk for this infection.  She verbalized understanding.  Stephanie Brewer feels like this neratinib dose is working for her and the side effects are manageable, and she is in agreement to continue the neratinib at 4 tablets daily at this time. Will notify [name] of my discussion with Stephanie Brewer.  Allergies Allergies  Allergen Reactions   Hyoscyamine Anaphylaxis   Doxycycline     Racing heart.   Nexium [Esomeprazole]     Swollen throat    Vitals Vitals with BMI 03/23/2021 02/20/2021 02/14/2021  Height 5\' 4"  5\' 4"  5\' 4"   Weight 171 lbs 10 oz  177 lbs 6 oz 177 lbs 13 oz  BMI 29.44 93.23 55.7  Systolic 322 025 427  Diastolic 63 87 74  Pulse 70 89 84     Laboratory Data CBC EXTENDED Latest Ref Rng & Units 03/23/2021 03/07/2021 02/20/2021  WBC 4.0 - 10.5 K/uL 3.6(L) 3.5(L) 4.6  RBC 3.87 - 5.11 MIL/uL 4.24 4.12 4.47  HGB 12.0 - 15.0 g/dL 11.9(L) 11.7(L) 12.5  HCT 36.0 - 46.0 % 37.0 35.3(L) 39.1  PLT 150 - 400 K/uL 233 194 265  NEUTROABS 1.7 - 7.7 K/uL 2.3 2.3 2.9  LYMPHSABS 0.7 - 4.0 K/uL 0.7 0.6(L) 0.8    CMP Latest Ref Rng & Units 03/23/2021 03/07/2021 02/20/2021  Glucose 70 - 99 mg/dL 103(H) 100(H) 95  BUN 6 - 20 mg/dL  12 17 17   Creatinine 0.44 - 1.00 mg/dL 0.72 0.87 0.81  Sodium 135 - 145 mmol/L 140 141 141  Potassium 3.5 - 5.1 mmol/L 3.9 4.1 3.8  Chloride 98 - 111 mmol/L 106 108 110  CO2 22 - 32 mmol/L 28 24 23   Calcium 8.9 - 10.3 mg/dL 9.4 8.9 9.2  Total Protein 6.5 - 8.1 g/dL 7.0 6.8 7.0  Total Bilirubin 0.3 - 1.2 mg/dL 0.6 0.6 0.3  Alkaline Phos 38 - 126 U/L 98 103 119  AST 15 - 41 U/L 15 14(L) 17  ALT 0 - 44 U/L 13 11 13     Lab Results  Component Value Date   MG 1.6 (L) 12/04/2019   MG 1.7 11/13/2019   MG 1.9 10/26/2019     Adverse Effects Assessment Diarrhea: average 2 times daily. Will trial Lomotil 2 tablets daily every 6 hours as needed  Adherence Assessment Stephanie Brewer reports missing 0 doses over the past 4 weeks.   Reason for missed dose: N/A Patient was re-educated on importance of adherence.   Access Assessment Stephanie Brewer is currently receiving her neratinib through  [employer] concerns:  none, she reports her insurance did not change at the new year and she sent this information over to [facility] per their request.  Medication Reconciliation The patient's medication list was reviewed today with the patient? Yes New medications or herbal supplements have recently been started?  As above, was on cephalexin from 03/24/21 to 03/31/21 Any medications have been discontinued?  Cephalexin The medication list was updated and reconciled based on the patient's most recent medication list in the electronic medical record (EMR) including herbal products and OTC medications.   Medications Current Outpatient Medications  Medication Sig Dispense Refill   diphenoxylate-atropine (LOMOTIL) 2.5-0.025 MG tablet Take 2 tablets by mouth 4 (four) times daily as needed for diarrhea or loose stools.     anastrozole (ARIMIDEX) 1 MG tablet Take 1 tablet (1 mg total) by mouth daily. 90 tablet 3   calcium-vitamin D (OSCAL WITH D) 500-200 MG-UNIT tablet Take 1 tablet by  mouth in the morning and at bedtime.     glucose blood test strip UAD to check sugar. R73.03 100 each 12   Lancets Thin MISC UAD to check sugar. R73.03 100 each 12   loperamide (IMODIUM) 2 MG capsule Take 2 mg by mouth as needed for diarrhea or loose stools. Take 2 tabs (4 mg) with first loose stool, then 1 tab (2 mg) with each additional loose stool as needed. Do not take more than 8 tabs (16 mg) in a 24 hour period.     [START ON 04/25/2021] Neratinib Maleate (NERLYNX) 40  MG tablet Take 4 tablets (160 mg total) by mouth daily. Take with food. 112 tablet 3   ondansetron (ZOFRAN) 8 MG tablet Take 1 tablet (8 mg total) by mouth 3 (three) times daily as needed (Nausea or vomiting). (Patient not taking: Reported on 03/23/2021) 30 tablet 1   prochlorperazine (COMPAZINE) 10 MG tablet Take 1 tablet (10 mg total) by mouth every 6 (six) hours as needed (Nausea or vomiting). (Patient not taking: Reported on 03/23/2021) 30 tablet 1   rivaroxaban (XARELTO) 20 MG TABS tablet Take 1 tablet (20 mg total) by mouth daily with supper. 30 tablet 12   No current facility-administered medications for this visit.   Facility-Administered Medications Ordered in Other Visits  Medication Dose Route Frequency Provider Last Rate Last Admin   sodium chloride flush (NS) 0.9 % injection 10 mL  10 mL Intravenous PRN Nicholas Lose, MD   10 mL at 09/02/19 0839    Drug-Drug Interactions (DDIs) DDIs were evaluated? Yes Significant DDIs? No  The patient was instructed to speak with their health care provider and/or the oral chemotherapy pharmacist before starting any new drug, including prescription or over the counter, natural / herbal products, or vitamins.  Supportive Care Diarrhea: reviewed to contact clinic immediately if diarrhea worsens or if she becomes febrile  Dosing Assessment Hepatic adjustments needed?  N/A: no labs done today Renal adjustments needed?  N/A: no labs done today Toxicity adjustments needed? No   The current dosing regimen is appropriate to continue at this time.  Follow-Up Plan New prescription for neratinib 4 tablets (160 mg) daily sent to [facility]. To start once current prescription runs out (approximately 11 days per patient report). Follow up clinical pharmacy call in 2 weeks for toxicity assessment and to access how Lomotil is working Clinical pharmacy visit and labs on 05/24/21 [name] visit and labs on 08/24/21  Stephanie Brewer participated in the discussion, expressed understanding, and voiced agreement with the above plan. All questions were answered to her satisfaction. The patient was advised to contact the clinic at [phone] with any questions or concerns prior to her return visit.   I spent 30 minutes assessing and educating the patient.  [name], RPH-CPP, 04/14/2021  11:52 AM

## 2021-04-24 ENCOUNTER — Other Ambulatory Visit: Payer: Self-pay

## 2021-04-24 ENCOUNTER — Ambulatory Visit: Payer: BC Managed Care – PPO | Attending: Hematology and Oncology

## 2021-04-24 VITALS — Wt 169.1 lb

## 2021-04-24 DIAGNOSIS — Z483 Aftercare following surgery for neoplasm: Secondary | ICD-10-CM

## 2021-04-24 NOTE — Therapy (Signed)
[facility] @ [facility] [place], [place], [address] Phone: [phone]   Fax:  [phone]  Physical Therapy Treatment  Patient Details  Name: Stephanie Brewer MRN: [id number] Date of Birth: 03/25/1966 No data recorded  Encounter Date: 04/24/2021   PT End of Session - 04/24/21 0846     Visit Number 2   # unchanged due to screen only   PT Start Time 0842    PT Stop Time 0846    PT Time Calculation (min) 4 min    Activity Tolerance Patient tolerated treatment well    Behavior During Therapy [facility] for tasks assessed/performed             Past Medical History:  Diagnosis Date   Cancer ([place])    DVT (deep venous thrombosis) ([place])    left leg knee and ankle    GERD (gastroesophageal reflux disease)     Past Surgical History:  Procedure Laterality Date   BREAST IMPLANT REMOVAL Bilateral 01/14/2020   Procedure: REMOVAL BREAST IMPLANTS;  Surgeon: Cindra Presume, MD;  Location: [facility];  Service: Plastics;  Laterality: Bilateral;   BREAST LUMPECTOMY WITH RADIOACTIVE SEED AND SENTINEL LYMPH NODE BIOPSY Right 01/14/2020   Procedure: Right breast seed localized lumpectomy with sentinel lymph node biopsy;  Surgeon: Rolm Bookbinder, MD;  Location: [facility];  Service: General;  Laterality: Right;   BREAST SURGERY     Augmentation 2003   HERNIA REPAIR  2002   LIPOSUCTION WITH LIPOFILLING Bilateral 11/29/2020   Procedure: LIPOSUCTION WITH LIPOFILLING FROM ABDOMEN TO BILATERAL BREASTS;  Surgeon: Cindra Presume, MD;  Location: [facility];  Service: Plastics;  Laterality: Bilateral;   MASTOPEXY Left 11/29/2020   Procedure: LEFT MASTOPEXY;  Surgeon: Cindra Presume, MD;  Location: [facility];  Service: Plastics;  Laterality: Left;   PORTACATH PLACEMENT N/A 09/01/2019   Procedure: INSERTION PORT-A-CATH WITH ULTRASOUND GUIDANCE;  Surgeon: Rolm Bookbinder, MD;  Location:  [facility];  Service: General;  Laterality: N/A;   TRIGGER FINGER RELEASE Left 05/09/2020   Procedure: RELEASE TRIGGER FINGER/A-1 PULLEY LEFT LONG;  Surgeon: Leanora Cover, MD;  Location: [facility];  Service: Orthopedics;  Laterality: Left;    Vitals:   04/24/21 0845  Weight: 169 lb 2 oz (76.7 kg)     Subjective Assessment - 04/24/21 0845     Subjective Pt returns for her 3 month L-Dex screen.    Pertinent History R breast cancer, grade 2 invasive ductal carcinoma, ER +, PR-, HER2+, beginning chemo next week 09/02/19, 01/15/20- R lumpectomy and SLNB (4 nodes all negative), will begin radiation 02/15/20,  hx of LLE DVT following foot surgery for bunion, acute DVT in LUE and in LLE diagnosed on 12/16/20                    L-DEX FLOWSHEETS - 04/24/21 0800       L-DEX LYMPHEDEMA SCREENING   Measurement Type Unilateral    L-DEX MEASUREMENT EXTREMITY Upper Extremity    POSITION  Standing    DOMINANT SIDE Left    At Risk Side Right    BASELINE SCORE (UNILATERAL) 1.4    L-DEX SCORE (UNILATERAL) 2.1    VALUE CHANGE (UNILAT) 0.7  PT Long Term Goals - 02/03/20 1448       PT LONG TERM GOAL #1   Title Pt will demonstrate she has regained full shoulder ROM and function post operatively compared to baselines.    Time 8    Period Weeks    Status Achieved                   Plan - 04/24/21 0847     Clinical Impression Statement Pt returns for her 3 month L-Dex screen. Her change from baseline of 0.7 is WNLs so no further treatment is required at this time except to cont every 3 month L-Dex screen which pt is agreeable to.    PT Next Visit Plan Cont every 3 month L-Dex screens for up to 2 years from her SLNB (~01/14/2022)    Consulted and Agree with Plan of Care Patient             Patient will benefit from skilled therapeutic intervention in order to improve the following  deficits and impairments:     Visit Diagnosis: Aftercare following surgery for neoplasm     Problem List Patient Active Problem List   Diagnosis Date Noted   Port-A-Cath in place 09/09/2019   Malignant neoplasm of upper-outer quadrant of right breast in female, estrogen receptor positive ([place]) 08/20/2019   Radicular leg pain 02/04/2018   Numbness and tingling 02/04/2018   Foot pain, left 02/04/2018   Displacement of breast implant 12/24/2017   H/O bilateral breast implants 12/24/2017   GERD (gastroesophageal reflux disease) 12/06/2015   Gluten intolerance 12/06/2015    [name], PTA 04/24/2021, 8:51 AM  [facility] @ [address] [place], [place], [address] Phone: [phone]   Fax:  [phone]  Name: Stephanie Brewer MRN: [id number] Date of Birth: Mar 01, 1966

## 2021-05-01 ENCOUNTER — Telehealth: Payer: Self-pay | Admitting: Pharmacist

## 2021-05-01 NOTE — Telephone Encounter (Signed)
[facility]       Telephone: [phone]?Fax: [phone]   Oncology Clinical Pharmacist Practitioner Progress Note  Stephanie Brewer is a 56 y.o. female with a diagnosis of breast cancer currently on adjuvant neratinib under the care of Dr. Nicholas Lose. They were contacted today via telephone.  Current treatment regimen and start date Neratinib (12/24/20) Anastrozole (07/13/20)  Interval History Stephanie Brewer is doing well.  She continues on neratinib 4 tablets (160 mg) daily. She was last contacted by clinical pharmacy on January 13.  At that time we had discussed starting Lomotil for her continued loose stools.  During our discussion today, Stephanie Brewer reports that she did start the Lomotil daily and that it was working well.  She felt that she was becoming somewhat constipated with the Lomotil, and is now taking it on an as-needed basis.  She is reporting no other symptoms at this time and knows to call the 24/7 triage line at [phone] if she has any fevers or new symptoms.  She will next see clinical pharmacy with labs on February 22.    Follow-Up Plan Labs / pharmacy visit on 05/24/21 Continue on neratinib 160 mg daily  Stephanie Brewer participated in the discussion, expressed understanding, and voiced agreement with the above plan. All questions were answered to her satisfaction. The patient was advised to contact the clinic at [phone] with any questions or concerns prior to her return visit.  Clinical pharmacy will continue to support Stephanie Brewer and Dr. Nicholas Lose as needed.  I spent 15 minutes assessing the patient.  [name], RPH-CPP,  05/01/2021  2:15 PM

## 2021-05-11 ENCOUNTER — Encounter (HOSPITAL_COMMUNITY): Payer: Self-pay

## 2021-05-16 ENCOUNTER — Other Ambulatory Visit (HOSPITAL_COMMUNITY): Payer: Self-pay

## 2021-05-24 ENCOUNTER — Inpatient Hospital Stay: Payer: BC Managed Care – PPO | Admitting: Pharmacist

## 2021-05-24 ENCOUNTER — Other Ambulatory Visit: Payer: Self-pay

## 2021-05-24 ENCOUNTER — Inpatient Hospital Stay: Payer: BC Managed Care – PPO | Attending: Hematology and Oncology

## 2021-05-24 VITALS — BP 145/75 | HR 82 | Temp 97.7°F | Resp 16 | Ht 64.0 in | Wt 170.7 lb

## 2021-05-24 DIAGNOSIS — Z170 Estrogen receptor positive status [ER+]: Secondary | ICD-10-CM | POA: Insufficient documentation

## 2021-05-24 DIAGNOSIS — M65311 Trigger thumb, right thumb: Secondary | ICD-10-CM | POA: Diagnosis not present

## 2021-05-24 DIAGNOSIS — C50411 Malignant neoplasm of upper-outer quadrant of right female breast: Secondary | ICD-10-CM | POA: Insufficient documentation

## 2021-05-24 LAB — CMP (CANCER CENTER ONLY)
ALT: 10 U/L (ref 0–44)
AST: 12 U/L — ABNORMAL LOW (ref 15–41)
Albumin: 4 g/dL (ref 3.5–5.0)
Alkaline Phosphatase: 99 U/L (ref 38–126)
Anion gap: 4 — ABNORMAL LOW (ref 5–15)
BUN: 15 mg/dL (ref 6–20)
CO2: 30 mmol/L (ref 22–32)
Calcium: 9.2 mg/dL (ref 8.9–10.3)
Chloride: 105 mmol/L (ref 98–111)
Creatinine: 0.79 mg/dL (ref 0.44–1.00)
GFR, Estimated: >60 mL/min (ref >60)
Glucose, Bld: 102 mg/dL — ABNORMAL HIGH (ref 70–99)
Potassium: 4.2 mmol/L (ref 3.5–5.1)
Sodium: 139 mmol/L (ref 135–145)
Total Bilirubin: 0.5 mg/dL (ref 0.3–1.2)
Total Protein: 6.7 g/dL (ref 6.5–8.1)

## 2021-05-24 LAB — CBC WITH DIFFERENTIAL (CANCER CENTER ONLY)
Abs Immature Granulocytes: 0.02 10*3/uL (ref 0.00–0.07)
Basophils Absolute: 0 10*3/uL (ref 0.0–0.1)
Basophils Relative: 1 %
Eosinophils Absolute: 0.2 10*3/uL (ref 0.0–0.5)
Eosinophils Relative: 4 %
HCT: 37.5 % (ref 36.0–46.0)
Hemoglobin: 12.4 g/dL (ref 12.0–15.0)
Immature Granulocytes: 1 %
Lymphocytes Relative: 19 %
Lymphs Abs: 0.8 10*3/uL (ref 0.7–4.0)
MCH: 28.5 pg (ref 26.0–34.0)
MCHC: 33.1 g/dL (ref 30.0–36.0)
MCV: 86.2 fL (ref 80.0–100.0)
Monocytes Absolute: 0.4 10*3/uL (ref 0.1–1.0)
Monocytes Relative: 9 %
Neutro Abs: 2.7 10*3/uL (ref 1.7–7.7)
Neutrophils Relative %: 66 %
Platelet Count: 228 10*3/uL (ref 150–400)
RBC: 4.35 MIL/uL (ref 3.87–5.11)
RDW: 15 % (ref 11.5–15.5)
WBC Count: 4.1 10*3/uL (ref 4.0–10.5)
nRBC: 0 % (ref 0.0–0.2)

## 2021-05-24 MED ORDER — DIPHENOXYLATE-ATROPINE 2.5-0.025 MG PO TABS: 2.0000 | ORAL_TABLET | Freq: Four times a day (QID) | ORAL | 2 refills | Status: DC | PRN

## 2021-05-24 NOTE — Progress Notes (Signed)
[facility]       Telephone: [phone]?Fax: [phone]   Oncology Clinical Pharmacist Practitioner Progress Note  Stephanie Brewer was contacted via in-person visit to discuss her chemotherapy regimen for neratinib which they receive under the care of Dr. Nicholas Lose.   Current treatment regimen and start date Neratinib (12/24/21) Anastrozole (07/13/20)  Interval History She continues on neratinib 4 tablets (160 mg) by mouth daily on days 1 to 28 of a 28-day cycle. This is being given  in combination with anastrozole . Therapy is planned to continue until  one year of extended adjuvant therapy .   Response to Therapy Stephanie Brewer was seen today as a follow-up to her neratinib therapy.  She last saw clinical pharmacy on January 30, and last saw [name] on November 21.  She is currently receiving labs every 3 months per the neratinib manufacturing guidelines.  Today she reports stable diarrhea at about 2 days/week.  She continues to use Lomotil and loperamide as needed.  A new prescription was given to her today for Lomotil. She states there are some days where the loose stools do not respond very well to either agent, but she feels that it is manageable.  Reviewing her labs today, she does not appear to be dehydrated and she states that she continues to drink plenty of fluids.  She continues to see her PCP for her slightly elevated blood sugars.  Stephanie Brewer reports that she will be having another visit with her in May and they are closely monitoring her blood sugars and may put her on diabetic medications if needed.  During her visit today, Stephanie Brewer reports some dizziness, especially when she is going from a crouched position to a standing position.  This is normally happening when she is trying to clean out the cat litter.  She noticed this also happens sometimes when she is going from a seated position such as her kitchen table to a standing position.  Orthostatics were  taken today and were unremarkable.  She does not have a blood pressure machine at home but was encouraged today to potentially purchase one just to see if her blood pressures may fluctuate during those times when she is feeling dizzy.  She states that she has taken her blood sugar at times when she is feeling dizzy and it has been normal.  She also reports new onset of headaches for about 2 weeks that she feels is mainly at the top of her head but she is also having pain in her back area.  She did have a MRI of the brain in July of last year which was negative.  Her headaches, dizziness, and back pain were reviewed with [name] today and we will order an MRI of the brain.  Stephanie Brewer was made aware of this decision and she is agreement with the plan.  She does also report some shakiness that occurs about 1 time per week.  When that happens, she feels that her heart is racing and she feels "off".  During those times she has checked her blood sugar, per her PCP instructions, and those values have been normal per her report.  She has a follow-up visit with labs scheduled in May with [name].  We discussed that if her imaging studies are of concern, that [name] would contact her sooner. She knows to call with any worsening symptoms or new symptoms in the interim. Labs, vitals, treatment parameters, and  manufacturer guidelines assessing toxicity were reviewed with Stephanie Brewer today. Based on these values, patient is in agreement to continue neratinib therapy at this time.  Allergies Allergies  Allergen Reactions   Hyoscyamine Anaphylaxis   Doxycycline     Racing heart.   Nexium [Esomeprazole]     Swollen throat    Vitals Vitals with BMI 05/24/2021 04/24/2021 03/23/2021  Height 5\' 4"  - 5\' 4"   Weight 170 lbs 11 oz 169 lbs 2 oz 171 lbs 10 oz  BMI 29.92 - 42.68  Systolic 341 - 962  Diastolic 75 - 63  Pulse 82 - 70     Laboratory Data CBC EXTENDED Latest Ref Rng & Units 05/24/2021  03/23/2021 03/07/2021  WBC 4.0 - 10.5 K/uL 4.1 3.6(L) 3.5(L)  RBC 3.87 - 5.11 MIL/uL 4.35 4.24 4.12  HGB 12.0 - 15.0 g/dL 12.4 11.9(L) 11.7(L)  HCT 36.0 - 46.0 % 37.5 37.0 35.3(L)  PLT 150 - 400 K/uL 228 233 194  NEUTROABS 1.7 - 7.7 K/uL 2.7 2.3 2.3  LYMPHSABS 0.7 - 4.0 K/uL 0.8 0.7 0.6(L)    CMP Latest Ref Rng & Units 05/24/2021 03/23/2021 03/07/2021  Glucose 70 - 99 mg/dL 102(H) 103(H) 100(H)  BUN 6 - 20 mg/dL 15 12 17   Creatinine 0.44 - 1.00 mg/dL 0.79 0.72 0.87  Sodium 135 - 145 mmol/L 139 140 141  Potassium 3.5 - 5.1 mmol/L 4.2 3.9 4.1  Chloride 98 - 111 mmol/L 105 106 108  CO2 22 - 32 mmol/L 30 28 24   Calcium 8.9 - 10.3 mg/dL 9.2 9.4 8.9  Total Protein 6.5 - 8.1 g/dL 6.7 7.0 6.8  Total Bilirubin 0.3 - 1.2 mg/dL 0.5 0.6 0.6  Alkaline Phos 38 - 126 U/L 99 98 103  AST 15 - 41 U/L 12(L) 15 14(L)  ALT 0 - 44 U/L 10 13 11     Lab Results  Component Value Date   MG 1.6 (L) 12/04/2019   MG 1.7 11/13/2019   MG 1.9 10/26/2019     Adverse Effects Assessment Dizziness, headaches, shakiness, back pain: orthostatics unremarkable, MRI brain ordered after speaking to [name] Diarrhea: controlled with diphenoxylate/atropine and loperamide as needed Blood sugars: closely monitored by her PCP who she sees again in May.  Adherence Assessment Stephanie Brewer reports missing 0 doses over the past 4 weeks.   Reason for missed dose: N/A Patient was re-educated on importance of adherence.   Access Assessment Stephanie Brewer is currently receiving her neratinib through  [employer] concerns:  none  Medication Reconciliation The patient's medication list was reviewed today with the patient? Yes New medications or herbal supplements have recently been started? No  Any medications have been discontinued? No  The medication list was updated and reconciled based on the patient's most recent medication list in the electronic medical record (EMR) including herbal  products and OTC medications.   Medications Current Outpatient Medications  Medication Sig Dispense Refill   anastrozole (ARIMIDEX) 1 MG tablet Take 1 tablet (1 mg total) by mouth daily. 90 tablet 3   calcium-vitamin D (OSCAL WITH D) 500-200 MG-UNIT tablet Take 1 tablet by mouth in the morning and at bedtime.     glucose blood test strip UAD to check sugar. R73.03 100 each 12   Lancets Thin MISC UAD to check sugar. R73.03 100 each 12   loperamide (IMODIUM) 2 MG capsule Take 2 mg by mouth as needed for diarrhea or loose stools. Take 2 tabs (  4 mg) with first loose stool, then 1 tab (2 mg) with each additional loose stool as needed. Do not take more than 8 tabs (16 mg) in a 24 hour period.     Neratinib Maleate (NERLYNX) 40 MG tablet Take 4 tablets (160 mg total) by mouth daily. Take with food. 112 tablet 3   rivaroxaban (XARELTO) 20 MG TABS tablet Take 1 tablet (20 mg total) by mouth daily with supper. 30 tablet 12   diphenoxylate-atropine (LOMOTIL) 2.5-0.025 MG tablet Take 2 tablets by mouth 4 (four) times daily as needed for diarrhea or loose stools. 30 tablet 2   ondansetron (ZOFRAN) 8 MG tablet Take 1 tablet (8 mg total) by mouth 3 (three) times daily as needed (Nausea or vomiting). (Patient not taking: Reported on 03/23/2021) 30 tablet 1   prochlorperazine (COMPAZINE) 10 MG tablet Take 1 tablet (10 mg total) by mouth every 6 (six) hours as needed (Nausea or vomiting). (Patient not taking: Reported on 03/23/2021) 30 tablet 1   No current facility-administered medications for this visit.   Facility-Administered Medications Ordered in Other Visits  Medication Dose Route Frequency Provider Last Rate Last Admin   sodium chloride flush (NS) 0.9 % injection 10 mL  10 mL Intravenous PRN Nicholas Lose, MD   10 mL at 09/02/19 0839    Drug-Drug Interactions (DDIs) DDIs were evaluated? Yes Significant DDIs?  No The patient was instructed to speak with their health care provider and/or the oral  chemotherapy pharmacist before starting any new drug, including prescription or over the counter, natural / herbal products, or vitamins.  Supportive Care Reviewed as needed instructions for diphenoxylate/atropine and loperamide  Dosing Assessment Hepatic adjustments needed? No  Renal adjustments needed? No  Toxicity adjustments needed? No  The current dosing regimen is appropriate to continue at this time.  Follow-Up Plan MRI brain ordered per [name] PCP follow up for blood sugars Continue diphenoxylate/atropine and loperamide as needed for diarrhea which is controlled. diphenoxylate/atropine prescription given to patient today (signed by [name]) Labs / [name] visit on 08/16/21, may see sooner if imaging is concerning  Stephanie Brewer participated in the discussion, expressed understanding, and voiced agreement with the above plan. All questions were answered to her satisfaction. The patient was advised to contact the clinic at [phone] with any questions or concerns prior to her return visit.   I spent 45 minutes assessing and educating the patient.  Stephanie Brewer, RPH-CPP, 05/24/2021  11:16 AM   **Disclaimer: This note was dictated with voice recognition software. Similar sounding words can inadvertently be transcribed and this note may contain transcription errors which may not have been corrected upon publication of note.**

## 2021-05-25 ENCOUNTER — Telehealth: Payer: Self-pay | Admitting: Hematology and Oncology

## 2021-05-25 NOTE — Telephone Encounter (Signed)
Scheduled appointment per 2/22 los. Patient is aware of upcoming appointment.

## 2021-06-06 ENCOUNTER — Ambulatory Visit (HOSPITAL_COMMUNITY)
Admission: RE | Admit: 2021-06-06 | Discharge: 2021-06-06 | Disposition: A | Discharge status: Home / Self Care | Payer: BC Managed Care – PPO | Source: Ambulatory Visit | Attending: Hematology and Oncology | Admitting: Hematology and Oncology

## 2021-06-06 DIAGNOSIS — Z170 Estrogen receptor positive status [ER+]: Secondary | ICD-10-CM | POA: Diagnosis not present

## 2021-06-06 DIAGNOSIS — C50411 Malignant neoplasm of upper-outer quadrant of right female breast: Secondary | ICD-10-CM | POA: Diagnosis not present

## 2021-06-06 DIAGNOSIS — R519 Headache, unspecified: Secondary | ICD-10-CM | POA: Diagnosis not present

## 2021-06-06 MED ORDER — GADOBUTROL 1 MMOL/ML IV SOLN
7.5000 mL | Freq: Once | INTRAVENOUS | Status: AC | PRN
  Administered 2021-06-06: 7.5 mL via INTRAVENOUS

## 2021-06-07 ENCOUNTER — Telehealth: Payer: Self-pay | Admitting: Hematology and Oncology

## 2021-06-07 NOTE — Telephone Encounter (Signed)
MRI brain: No evidence of abnormalities. ?Headaches could be from multiple reasons including tension headaches.  She will discuss this with her primary care physician. ?

## 2021-06-21 ENCOUNTER — Ambulatory Visit (HOSPITAL_COMMUNITY)
Admission: RE | Admit: 2021-06-21 | Discharge: 2021-06-21 | Disposition: A | Discharge status: Home / Self Care | Payer: BC Managed Care – PPO | Source: Ambulatory Visit | Attending: Family Medicine | Admitting: Family Medicine

## 2021-06-21 ENCOUNTER — Other Ambulatory Visit: Payer: Self-pay

## 2021-06-21 ENCOUNTER — Ambulatory Visit: Payer: BC Managed Care – PPO | Admitting: Family Medicine

## 2021-06-21 ENCOUNTER — Telehealth: Payer: Self-pay | Admitting: Pharmacist

## 2021-06-21 ENCOUNTER — Encounter: Payer: Self-pay | Admitting: Family Medicine

## 2021-06-21 VITALS — BP 120/71 | HR 68 | Temp 97.4°F | Ht 64.0 in | Wt 172.5 lb

## 2021-06-21 DIAGNOSIS — K5790 Diverticulosis of intestine, part unspecified, without perforation or abscess without bleeding: Secondary | ICD-10-CM | POA: Diagnosis not present

## 2021-06-21 DIAGNOSIS — R1032 Left lower quadrant pain: Secondary | ICD-10-CM | POA: Insufficient documentation

## 2021-06-21 DIAGNOSIS — N3001 Acute cystitis with hematuria: Secondary | ICD-10-CM

## 2021-06-21 DIAGNOSIS — R319 Hematuria, unspecified: Secondary | ICD-10-CM

## 2021-06-21 DIAGNOSIS — B379 Candidiasis, unspecified: Secondary | ICD-10-CM

## 2021-06-21 DIAGNOSIS — N281 Cyst of kidney, acquired: Secondary | ICD-10-CM | POA: Diagnosis not present

## 2021-06-21 DIAGNOSIS — R3915 Urgency of urination: Secondary | ICD-10-CM | POA: Insufficient documentation

## 2021-06-21 DIAGNOSIS — R109 Unspecified abdominal pain: Secondary | ICD-10-CM | POA: Insufficient documentation

## 2021-06-21 LAB — URINALYSIS, ROUTINE W REFLEX MICROSCOPIC
Bilirubin, UA: NEGATIVE
Glucose, UA: NEGATIVE
Leukocytes,UA: NEGATIVE
Nitrite, UA: NEGATIVE
Specific Gravity, UA: >1.03 — ABNORMAL HIGH (ref 1.005–1.030)
Urobilinogen, Ur: 0.2 mg/dL (ref 0.2–1.0)
pH, UA: 5 (ref 5.0–7.5)

## 2021-06-21 LAB — MICROSCOPIC EXAMINATION
Epithelial Cells (non renal): NONE SEEN /hpf (ref 0–10)
RBC, Urine: >30 /hpf — AB (ref 0–2)
Renal Epithel, UA: NONE SEEN /hpf

## 2021-06-21 MED ORDER — SULFAMETHOXAZOLE-TRIMETHOPRIM 800-160 MG PO TABS: 1.0000 | ORAL_TABLET | Freq: Two times a day (BID) | ORAL | 0 refills | Status: AC

## 2021-06-21 MED ORDER — FLUCONAZOLE 150 MG PO TABS: ORAL_TABLET | ORAL | 0 refills | Status: DC

## 2021-06-21 NOTE — Telephone Encounter (Signed)
[place]  ?     Telephone: [phone]?Fax: [phone]  ? ?Oncology Clinical Pharmacist Practitioner Progress Note ? ?Stephanie Brewer is a 56 y.o. female with a diagnosis of breast cancer currently on adjuvant neratinib/anastrozole under the care of Dr. Nicholas Lose.  ? ?Stephanie Brewer contacted clinical pharmacy today and her call was returned promptly.  She reports having trouble urinating since yesterday afternoon.  She states "a few drops" have come out since yesterday evening.  She states that this morning she started having left-sided flank pain and her symptoms have gotten progressively worse.  We recommended her being seen by urgent care or an ED.  She states that she does have an appointment with her PCPs office at 10:30 AM and she feels that she will be seen quicker going to this appointment versus going to the ED or urgent care.  We recommended she be seen as quickly as possible. She verbalized understanding of the plan.  Our conversation from today will be forwarded to [name] and we recommended Stephanie Brewer contact Dr. Geralyn Flash clinic back at [phone] when she feels able.  We also reviewed that the [phone] number is the best number to call for side effects or urgent matters since this line is monitored 24/7.  Her next appointment currently is scheduled for May 25 with [name].  She knows that she can request an earlier appointment if needed. ? ?Clinical pharmacy will continue to support Stephanie Brewer and Dr. Nicholas Lose as needed. ? ?[name], RPH-CPP,  ?06/21/2021  9:10 AM  ? ?**Disclaimer: This note was dictated with voice recognition software. Similar sounding words can inadvertently be transcribed and this note may contain transcription errors which may not have been corrected upon publication of note.** ? ?

## 2021-06-21 NOTE — Progress Notes (Signed)
? ?Acute Office Visit ? ?Subjective:  ? ? Patient ID: Stephanie Brewer, female    DOB: 1965-10-11, 56 y.o.   MRN: [id number] ? ?Chief Complaint  ?Patient presents with  ? Flank Pain  ? Urinary Urgency  ? ? ?HPI ?Patient is in today for left flank pain x 1 day. The pain is constant and achy. It radiates to the front. The pain has been worse. The pain is a 6/10. She also report urinary urgency and frequency x 2 days. She denies fever, chills, vomiting, dysuria, or hematuria. She has felt nauseous today. She has had an UTI before, about 12 years ago. She has had kidney stones before, about 10 years ago. She had multiples stones at the time. She is on chemo medications.  ? ?Past Medical History:  ?Diagnosis Date  ? Cancer [facility])   ? DVT (deep venous thrombosis) ([place])   ? left leg knee and ankle   ? GERD (gastroesophageal reflux disease)   ? ? ?Past Surgical History:  ?Procedure Laterality Date  ? BREAST IMPLANT REMOVAL Bilateral 01/14/2020  ? Procedure: REMOVAL BREAST IMPLANTS;  Surgeon: Cindra Presume, MD;  Location: [place];  Service: Plastics;  Laterality: Bilateral;  ? BREAST LUMPECTOMY WITH RADIOACTIVE SEED AND SENTINEL LYMPH NODE BIOPSY Right 01/14/2020  ? Procedure: Right breast seed localized lumpectomy with sentinel lymph node biopsy;  Surgeon: Rolm Bookbinder, MD;  Location: [place];  Service: General;  Laterality: Right;  ? BREAST SURGERY    ? Augmentation 2003  ? HERNIA REPAIR  2002  ? LIPOSUCTION WITH LIPOFILLING Bilateral 11/29/2020  ? Procedure: LIPOSUCTION WITH LIPOFILLING FROM ABDOMEN TO BILATERAL BREASTS;  Surgeon: Cindra Presume, MD;  Location: [place];  Service: Plastics;  Laterality: Bilateral;  ? MASTOPEXY Left 11/29/2020  ? Procedure: LEFT MASTOPEXY;  Surgeon: Cindra Presume, MD;  Location: [name];  Service: Plastics;  Laterality: Left;  ? PORTACATH PLACEMENT N/A 09/01/2019  ? Procedure: INSERTION PORT-A-CATH WITH ULTRASOUND  GUIDANCE;  Surgeon: Rolm Bookbinder, MD;  Location: [place];  Service: General;  Laterality: N/A;  ? TRIGGER FINGER RELEASE Left 05/09/2020  ? Procedure: RELEASE TRIGGER FINGER/A-1 PULLEY LEFT LONG;  Surgeon: Leanora Cover, MD;  Location: [place];  Service: Orthopedics;  Laterality: Left;  ? ? ?Family History  ?Problem Relation Age of Onset  ? Hypertension Father   ? ? ?Social History  ? ?Socioeconomic History  ? Marital status: Married  ?  Spouse name: Not on file  ? Number of children: 2  ? Years of education: some college  ? Highest education level: Not on file  ?Occupational History  ? Occupation: [employer]  ?Tobacco Use  ? Smoking status: Never  ? Smokeless tobacco: Never  ?Vaping Use  ? Vaping Use: Never used  ?Substance and Sexual Activity  ? Alcohol use: Not Currently  ?  Comment: rare-twice monthly or less  ? Drug use: No  ? Sexual activity: Yes  ?  Birth control/protection: I.U.D.  ?Other Topics Concern  ? Not on file  ?Social History Narrative  ? Lives with spouse  ? Caffeine use: no caffeine   ? Left handed   ? ?Social Determinants of Health  ? ?Financial Resource Strain: Not on file  ?Food Insecurity: Not on file  ?Transportation Needs: Not on file  ?Physical Activity: Not on file  ?Stress: Not on file  ?Social Connections: Not on file  ?Intimate Partner Violence:  Not on file  ? ? ?Outpatient Medications Prior to Visit  ?Medication Sig Dispense Refill  ? anastrozole (ARIMIDEX) 1 MG tablet Take 1 tablet (1 mg total) by mouth daily. 90 tablet 3  ? calcium-vitamin D (OSCAL WITH D) 500-200 MG-UNIT tablet Take 1 tablet by mouth in the morning and at bedtime.    ? diphenoxylate-atropine (LOMOTIL) 2.5-0.025 MG tablet Take 2 tablets by mouth 4 (four) times daily as needed for diarrhea or loose stools. 30 tablet 2  ? glucose blood test strip UAD to check sugar. R73.03 100 each 12  ? Lancets Thin MISC UAD to check sugar. R73.03 100 each 12  ? loperamide (IMODIUM) 2 MG  capsule Take 2 mg by mouth as needed for diarrhea or loose stools. Take 2 tabs (4 mg) with first loose stool, then 1 tab (2 mg) with each additional loose stool as needed. Do not take more than 8 tabs (16 mg) in a 24 hour period.    ? Neratinib Maleate (NERLYNX) 40 MG tablet Take 4 tablets (160 mg total) by mouth daily. Take with food. 112 tablet 3  ? ondansetron (ZOFRAN) 8 MG tablet Take 1 tablet (8 mg total) by mouth 3 (three) times daily as needed (Nausea or vomiting). (Patient not taking: Reported on 03/23/2021) 30 tablet 1  ? prochlorperazine (COMPAZINE) 10 MG tablet Take 1 tablet (10 mg total) by mouth every 6 (six) hours as needed (Nausea or vomiting). (Patient not taking: Reported on 03/23/2021) 30 tablet 1  ? rivaroxaban (XARELTO) 20 MG TABS tablet Take 1 tablet (20 mg total) by mouth daily with supper. 30 tablet 12  ? ?Facility-Administered Medications Prior to Visit  ?Medication Dose Route Frequency Provider Last Rate Last Admin  ? sodium chloride flush (NS) 0.9 % injection 10 mL  10 mL Intravenous PRN Nicholas Lose, MD   10 mL at 09/02/19 0839  ? ? ?Allergies  ?Allergen Reactions  ? Hyoscyamine Anaphylaxis  ? Doxycycline   ?  Racing heart.  ? Nexium [Esomeprazole]   ?  Swollen throat  ? ? ?Review of Systems ?As per HPI.  ?   ?Objective:  ?  ?Physical Exam ?Vitals and nursing note reviewed.  ?Constitutional:   ?   General: She is not in acute distress. ?   Appearance: She is not ill-appearing, toxic-appearing or diaphoretic.  ?Cardiovascular:  ?   Rate and Rhythm: Normal rate and regular rhythm.  ?   Heart sounds: Normal heart sounds. No murmur heard. ?Pulmonary:  ?   Effort: No respiratory distress.  ?   Breath sounds: Normal breath sounds. No wheezing.  ?Abdominal:  ?   General: Bowel sounds are normal. There is no distension.  ?   Palpations: Abdomen is soft.  ?   Tenderness: There is abdominal tenderness in the right lower quadrant, suprapubic area and left lower quadrant. There is no right CVA  tenderness, left CVA tenderness, guarding or rebound. Negative signs include Murphy's sign, Rovsing's sign and McBurney's sign.  ?   Hernia: No hernia is present.  ?Musculoskeletal:  ?   Right lower leg: No edema.  ?   Left lower leg: No edema.  ?Skin: ?   General: Skin is warm and dry.  ?Neurological:  ?   Mental Status: She is alert and oriented to person, place, and time.  ?Psychiatric:     ?   Mood and Affect: Mood normal.     ?   Behavior: Behavior normal.  ? ?Urine dipstick shows  positive for RBC's, positive for protein, positive for urobilinogen, and positive for ketones.  Micro exam: 0-5 WBC's per HPF, >30 RBC's per HPF, few yeast, and few+ bacteria. ? ?BP 120/71   Pulse 68   Temp (!) 97.4 ?F (36.3 ?C) (Temporal)   Ht '5\' 4"'$  (1.626 m)   Wt 172 lb 8 oz (78.2 kg)   BMI 29.61 kg/m?  ?Wt Readings from Last 3 Encounters:  ?06/21/21 172 lb 8 oz (78.2 kg)  ?05/24/21 170 lb 11.2 oz (77.4 kg)  ?04/24/21 169 lb 2 oz (76.7 kg)  ? ? ?Health Maintenance Due  ?Topic Date Due  ? COVID-19 Vaccine (1) Never done  ? HIV Screening  Never done  ? Hepatitis C Screening  Never done  ? Zoster Vaccines- Shingrix (1 of 2) Never done  ? PAP SMEAR-Modifier  08/27/2016  ? TETANUS/TDAP  07/29/2018  ? ? ?There are no preventive care reminders to display for this patient. ? ? ?Lab Results  ?Component Value Date  ? TSH 1.740 07/20/2019  ? ?Lab Results  ?Component Value Date  ? WBC 4.1 05/24/2021  ? HGB 12.4 05/24/2021  ? HCT 37.5 05/24/2021  ? MCV 86.2 05/24/2021  ? PLT 228 05/24/2021  ? ?Lab Results  ?Component Value Date  ? NA 139 05/24/2021  ? K 4.2 05/24/2021  ? CO2 30 05/24/2021  ? GLUCOSE 102 (H) 05/24/2021  ? BUN 15 05/24/2021  ? CREATININE 0.79 05/24/2021  ? BILITOT 0.5 05/24/2021  ? ALKPHOS 99 05/24/2021  ? AST 12 (L) 05/24/2021  ? ALT 10 05/24/2021  ? PROT 6.7 05/24/2021  ? ALBUMIN 4.0 05/24/2021  ? CALCIUM 9.2 05/24/2021  ? ANIONGAP 4 (L) 05/24/2021  ? ?Lab Results  ?Component Value Date  ? CHOL 198 07/20/2019  ? ?Lab  Results  ?Component Value Date  ? HDL 59 07/20/2019  ? ?Lab Results  ?Component Value Date  ? LDLCALC 118 (H) 07/20/2019  ? ?Lab Results  ?Component Value Date  ? TRIG 119 07/20/2019  ? ?Lab Results  ?Component Value Date

## 2021-06-21 NOTE — Patient Instructions (Signed)
Kidney Stones ?Kidney stones are solid, rock-like deposits that form inside of the kidneys. The kidneys are a pair of organs that make urine. A kidney stone may form in a kidney and move into other parts of the urinary tract, including the tubes that connect the kidneys to the bladder (ureters), the bladder, and the tube that carries urine out of the body (urethra). As the stone moves through these areas, it can cause intense pain and block the flow of urine. ?Kidney stones are created when high levels of certain minerals are found in the urine. The stones are usually passed out of the body through urination, but in some cases, medical treatment may be needed to remove them. ?What are the causes? ?Kidney stones may be caused by: ?A condition in which certain glands produce too much parathyroid hormone (primary hyperparathyroidism), which causes too much calcium buildup in the blood. ?A buildup of uric acid crystals in the bladder (hyperuricosuria). Uric acid is a chemical that the body produces when you eat certain foods. It usually exits the body in the urine. ?Narrowing (stricture) of one or both of the ureters. ?A kidney blockage that is present at birth (congenital obstruction). ?Past surgery on the kidney or the ureters, such as gastric bypass surgery. ?What increases the risk? ?The following factors may make you more likely to develop this condition: ?Having had a kidney stone in the past. ?Having a family history of kidney stones. ?Not drinking enough water. ?Eating a diet that is high in protein, salt (sodium), or sugar. ?Being overweight or obese. ?What are the signs or symptoms? ?Symptoms of a kidney stone may include: ?Pain in the side of the abdomen, right below the ribs (flank pain). Pain usually spreads (radiates) to the groin. ?Needing to urinate frequently or urgently. ?Painful urination. ?Blood in the urine (hematuria). ?Nausea. ?Vomiting. ?Fever and chills. ?How is this diagnosed? ?This condition  may be diagnosed based on: ?Your symptoms and medical history. ?A physical exam. ?Blood tests. ?Urine tests. These may be done before and after the stone passes out of your body through urination. ?Imaging tests, such as a CT scan, abdominal X-ray, or ultrasound. ?A procedure to examine the inside of the bladder (cystoscopy). ?How is this treated? ?Treatment for kidney stones depends on the size, location, and makeup of the stones. Kidney stones will often pass out of the body through urination. You may need to: ?Increase your fluid intake to help pass the stone. In some cases, you may be given fluids through an IV and may need to be monitored at the hospital. ?Take medicine for pain. ?Make changes in your diet to help prevent kidney stones from coming back. ?Sometimes, medical procedures are needed to remove a kidney stone. This may involve: ?A procedure to break up kidney stones using: ?A focused beam of light (laser therapy). ?Shock waves (extracorporeal shock wave lithotripsy). ?Surgery to remove kidney stones. This may be needed if you have severe pain or have stones that block your urinary tract. ?Follow these instructions at home: ?Medicines ?Take over-the-counter and prescription medicines only as told by your health care provider. ?Ask your health care provider if the medicine prescribed to you requires you to avoid driving or using heavy machinery. ?Eating and drinking ?Drink enough fluid to keep your urine pale yellow. You may be instructed to drink at least 8-10 glasses of water each day. This will help you pass the kidney stone. ?If directed, change your diet. This may include: ?Limiting how much sodium  you eat. ?Eating more fruits and vegetables. ?Limiting how much animal protein--such as red meat, poultry, fish, and eggs--you eat. ?Follow instructions from your health care provider about eating or drinking restrictions. ?General instructions ?Collect urine samples as told by your health care provider.  You may need to collect a urine sample: ?24 hours after you pass the stone. ?8-12 weeks after passing the kidney stone, and every 6-12 months after that. ?Strain your urine every time you urinate, for as long as directed. Use the strainer that your health care provider recommends. ?Do not throw out the kidney stone after passing it. Keep the stone so it can be tested by your health care provider. Testing the makeup of your kidney stone may help prevent you from getting kidney stones in the future. ?Keep all follow-up visits as told by your health care provider. This is important. You may need follow-up X-rays or ultrasounds to make sure that your stone has passed. ?How is this prevented? ?To prevent another kidney stone: ?Drink enough fluid to keep your urine pale yellow. This is the best way to prevent kidney stones. ?Eat a healthy diet and follow recommendations from your health care provider about foods to avoid. You may be instructed to eat a low-protein diet. Recommendations vary depending on the type of kidney stone that you have. ?Maintain a healthy weight. ?Where to find more information ?[place] [facility]): www.kidney.org ?[facility]): www.urologyhealth.org ?Contact a health care provider if: ?You have pain that gets worse or does not get better with medicine. ?Get help right away if: ?You have a fever or chills. ?You develop severe pain. ?You develop new abdominal pain. ?You faint. ?You are unable to urinate. ?Summary ?Kidney stones are solid, rock-like deposits that form inside of the kidneys. ?Kidney stones can cause nausea, vomiting, blood in the urine, abdominal pain, and the urge to urinate frequently. ?Treatment for kidney stones depends on the size, location, and makeup of the stones. Kidney stones will often pass out of the body through urination. ?Kidney stones can be prevented by drinking enough fluids, eating a healthy diet, and maintaining a healthy weight. ?This  information is not intended to replace advice given to you by your health care provider. Make sure you discuss any questions you have with your health care provider. ?Document Revised: 08/01/2018 Document Reviewed: 08/05/2018 ?Elsevier Patient Education ? [place]. ? ?

## 2021-06-23 LAB — URINE CULTURE

## 2021-07-10 ENCOUNTER — Encounter: Payer: Self-pay | Admitting: Family Medicine

## 2021-07-10 ENCOUNTER — Encounter: Payer: Self-pay | Admitting: Hematology and Oncology

## 2021-07-10 ENCOUNTER — Ambulatory Visit: Payer: BC Managed Care – PPO | Admitting: Family Medicine

## 2021-07-10 ENCOUNTER — Other Ambulatory Visit: Payer: Self-pay | Admitting: Hematology and Oncology

## 2021-07-10 VITALS — BP 115/80 | HR 90 | Temp 97.6°F | Ht 64.0 in | Wt 167.6 lb

## 2021-07-10 DIAGNOSIS — N3000 Acute cystitis without hematuria: Secondary | ICD-10-CM

## 2021-07-10 DIAGNOSIS — R109 Unspecified abdominal pain: Secondary | ICD-10-CM

## 2021-07-10 DIAGNOSIS — Z170 Estrogen receptor positive status [ER+]: Secondary | ICD-10-CM

## 2021-07-10 LAB — MICROSCOPIC EXAMINATION: Renal Epithel, UA: NONE SEEN /hpf

## 2021-07-10 LAB — URINALYSIS, ROUTINE W REFLEX MICROSCOPIC
Bilirubin, UA: NEGATIVE
Glucose, UA: NEGATIVE
Leukocytes,UA: NEGATIVE
Nitrite, UA: NEGATIVE
Specific Gravity, UA: 1.025 (ref 1.005–1.030)
Urobilinogen, Ur: 0.2 mg/dL (ref 0.2–1.0)
pH, UA: 5.5 (ref 5.0–7.5)

## 2021-07-10 MED ORDER — SULFAMETHOXAZOLE-TRIMETHOPRIM 800-160 MG PO TABS: 1.0000 | ORAL_TABLET | Freq: Two times a day (BID) | ORAL | 0 refills | Status: AC

## 2021-07-10 NOTE — Progress Notes (Signed)
? ?Acute Office Visit ? ?Subjective:  ? ? Patient ID: Stephanie Brewer, female    DOB: 1965/10/20, 56 y.o.   MRN: [id number] ? ?Chief Complaint  ?Patient presents with  ? Chills  ? Abdominal Pain  ?  Lower abdominal pressure x 4 days   ? Hematuria  ?  Patient states she noticed blood in her urine on Saturday   ? ? ?HPI ?Patient is in today for lower abdominal pressure x 4 days. She did noticed some blood in her urine 2 days ago but not since. She also reports chills and lower abdominal pain x 4 weeks. Pain and pressure has improved over the last 2 days. She also report urgency and denies frequency. She is having to urinate multiple times at night which is unlike her. She denies discharge or itching. Denies nausea or vomiting. She recently was treated for a UTI with keflex. She is on a chemo drug and has noted that a possible side effect of the chemo drug is UTIs. She also recently had a renal stone study CT that was normal.  ? ?Past Medical History:  ?Diagnosis Date  ? Cancer [facility])   ? DVT (deep venous thrombosis) ([place])   ? left leg knee and ankle   ? GERD (gastroesophageal reflux disease)   ? ? ?Past Surgical History:  ?Procedure Laterality Date  ? BREAST IMPLANT REMOVAL Bilateral 01/14/2020  ? Procedure: REMOVAL BREAST IMPLANTS;  Surgeon: Cindra Presume, MD;  Location: [place];  Service: Plastics;  Laterality: Bilateral;  ? BREAST LUMPECTOMY WITH RADIOACTIVE SEED AND SENTINEL LYMPH NODE BIOPSY Right 01/14/2020  ? Procedure: Right breast seed localized lumpectomy with sentinel lymph node biopsy;  Surgeon: Rolm Bookbinder, MD;  Location: [place];  Service: General;  Laterality: Right;  ? BREAST SURGERY    ? Augmentation 2003  ? HERNIA REPAIR  2002  ? LIPOSUCTION WITH LIPOFILLING Bilateral 11/29/2020  ? Procedure: LIPOSUCTION WITH LIPOFILLING FROM ABDOMEN TO BILATERAL BREASTS;  Surgeon: Cindra Presume, MD;  Location: [place];  Service: Plastics;  Laterality:  Bilateral;  ? MASTOPEXY Left 11/29/2020  ? Procedure: LEFT MASTOPEXY;  Surgeon: Cindra Presume, MD;  Location: [place];  Service: Plastics;  Laterality: Left;  ? PORTACATH PLACEMENT N/A 09/01/2019  ? Procedure: INSERTION PORT-A-CATH WITH ULTRASOUND GUIDANCE;  Surgeon: Rolm Bookbinder, MD;  Location: [place];  Service: General;  Laterality: N/A;  ? TRIGGER FINGER RELEASE Left 05/09/2020  ? Procedure: RELEASE TRIGGER FINGER/A-1 PULLEY LEFT LONG;  Surgeon: Leanora Cover, MD;  Location: [place];  Service: Orthopedics;  Laterality: Left;  ? ? ?Family History  ?Problem Relation Age of Onset  ? Hypertension Father   ? ? ?Social History  ? ?Socioeconomic History  ? Marital status: Married  ?  Spouse name: Not on file  ? Number of children: 2  ? Years of education: some college  ? Highest education level: Not on file  ?Occupational History  ? Occupation: [employer]  ?Tobacco Use  ? Smoking status: Never  ? Smokeless tobacco: Never  ?Vaping Use  ? Vaping Use: Never used  ?Substance and Sexual Activity  ? Alcohol use: Not Currently  ?  Comment: rare-twice monthly or less  ? Drug use: No  ? Sexual activity: Yes  ?  Birth control/protection: I.U.D.  ?Other Topics Concern  ? Not on file  ?Social History Narrative  ? Lives with spouse  ? Caffeine use:  no caffeine   ? Left handed   ? ?Social Determinants of Health  ? ?Financial Resource Strain: Not on file  ?Food Insecurity: Not on file  ?Transportation Needs: Not on file  ?Physical Activity: Not on file  ?Stress: Not on file  ?Social Connections: Not on file  ?Intimate Partner Violence: Not on file  ? ? ?Outpatient Medications Prior to Visit  ?Medication Sig Dispense Refill  ? anastrozole (ARIMIDEX) 1 MG tablet Take 1 tablet (1 mg total) by mouth daily. 90 tablet 3  ? calcium-vitamin D (OSCAL WITH D) 500-200 MG-UNIT tablet Take 1 tablet by mouth in the morning and at bedtime.    ? diphenoxylate-atropine (LOMOTIL) 2.5-0.025 MG  tablet Take 2 tablets by mouth 4 (four) times daily as needed for diarrhea or loose stools. 30 tablet 2  ? glucose blood test strip UAD to check sugar. R73.03 100 each 12  ? Lancets Thin MISC UAD to check sugar. R73.03 100 each 12  ? loperamide (IMODIUM) 2 MG capsule Take 2 mg by mouth as needed for diarrhea or loose stools. Take 2 tabs (4 mg) with first loose stool, then 1 tab (2 mg) with each additional loose stool as needed. Do not take more than 8 tabs (16 mg) in a 24 hour period.    ? Neratinib Maleate (NERLYNX) 40 MG tablet Take 4 tablets (160 mg total) by mouth daily. Take with food. 112 tablet 3  ? ondansetron (ZOFRAN) 8 MG tablet Take 8 mg by mouth 3 (three) times daily as needed (Nausea or vomiting). 30 tablet 1  ? prochlorperazine (COMPAZINE) 10 MG tablet Take 10 mg by mouth every 6 (six) hours as needed (Nausea or vomiting). 30 tablet 1  ? rivaroxaban (XARELTO) 20 MG TABS tablet Take 1 tablet (20 mg total) by mouth daily with supper. 30 tablet 12  ? fluconazole (DIFLUCAN) 150 MG tablet Take one tablet by mouth now, then repeat after completion of antibiotics. 2 tablet 0  ? ?Facility-Administered Medications Prior to Visit  ?Medication Dose Route Frequency Provider Last Rate Last Admin  ? sodium chloride flush (NS) 0.9 % injection 10 mL  10 mL Intravenous PRN Nicholas Lose, MD   10 mL at 09/02/19 0839  ? ? ?Allergies  ?Allergen Reactions  ? Hyoscyamine Anaphylaxis  ? Doxycycline   ?  Racing heart.  ? Nexium [Esomeprazole]   ?  Swollen throat  ? ? ?Review of Systems ?Negative unless specially indicated above in HPI. ?   ?Objective:  ?  ?Physical Exam ?Vitals and nursing note reviewed.  ?Constitutional:   ?   General: She is not in acute distress. ?   Appearance: She is not ill-appearing, toxic-appearing or diaphoretic.  ?Cardiovascular:  ?   Rate and Rhythm: Normal rate and regular rhythm.  ?   Heart sounds: Normal heart sounds. No murmur heard. ?Pulmonary:  ?   Effort: Pulmonary effort is normal. No  respiratory distress.  ?   Breath sounds: Normal breath sounds.  ?Abdominal:  ?   General: Bowel sounds are normal. There is no distension.  ?   Palpations: Abdomen is soft. There is no shifting dullness or fluid wave.  ?   Tenderness: There is abdominal tenderness in the suprapubic area. There is no right CVA tenderness, left CVA tenderness, guarding or rebound.  ?   Hernia: No hernia is present.  ?Skin: ?   General: Skin is warm and dry.  ?Neurological:  ?   General: No focal deficit present.  ?  Mental Status: She is alert and oriented to person, place, and time.  ?Psychiatric:     ?   Mood and Affect: Mood normal.     ?   Behavior: Behavior normal.  ? ? ?BP 115/80   Pulse 90   Temp 97.6 ?F (36.4 ?C) (Temporal)   Ht '5\' 4"'$  (1.626 m)   Wt 167 lb 9.6 oz (76 kg)   BMI 28.77 kg/m?  ?Wt Readings from Last 3 Encounters:  ?07/10/21 167 lb 9.6 oz (76 kg)  ?06/21/21 172 lb 8 oz (78.2 kg)  ?05/24/21 170 lb 11.2 oz (77.4 kg)  ? ? ?Health Maintenance Due  ?Topic Date Due  ? COVID-19 Vaccine (1) Never done  ? HIV Screening  Never done  ? Hepatitis C Screening  Never done  ? Zoster Vaccines- Shingrix (1 of 2) Never done  ? PAP SMEAR-Modifier  08/27/2016  ? TETANUS/TDAP  07/29/2018  ? ? ?There are no preventive care reminders to display for this patient. ? ? ?Lab Results  ?Component Value Date  ? TSH 1.740 07/20/2019  ? ?Lab Results  ?Component Value Date  ? WBC 4.1 05/24/2021  ? HGB 12.4 05/24/2021  ? HCT 37.5 05/24/2021  ? MCV 86.2 05/24/2021  ? PLT 228 05/24/2021  ? ?Lab Results  ?Component Value Date  ? NA 139 05/24/2021  ? K 4.2 05/24/2021  ? CO2 30 05/24/2021  ? GLUCOSE 102 (H) 05/24/2021  ? BUN 15 05/24/2021  ? CREATININE 0.79 05/24/2021  ? BILITOT 0.5 05/24/2021  ? ALKPHOS 99 05/24/2021  ? AST 12 (L) 05/24/2021  ? ALT 10 05/24/2021  ? PROT 6.7 05/24/2021  ? ALBUMIN 4.0 05/24/2021  ? CALCIUM 9.2 05/24/2021  ? ANIONGAP 4 (L) 05/24/2021  ? ?Lab Results  ?Component Value Date  ? CHOL 198 07/20/2019  ? ?Lab Results   ?Component Value Date  ? HDL 59 07/20/2019  ? ?Lab Results  ?Component Value Date  ? LDLCALC 118 (H) 07/20/2019  ? ?Lab Results  ?Component Value Date  ? TRIG 119 07/20/2019  ? ?Lab Results  ?Component Value Date  ?

## 2021-07-10 NOTE — Patient Instructions (Signed)
Urinary Tract Infection, Adult A urinary tract infection (UTI) is an infection of any part of the urinary tract. The urinary tract includes the kidneys, ureters, bladder, and urethra. These organs make, store, and get rid of urine in the body. An upper UTI affects the ureters and kidneys. A lower UTI affects the bladder and urethra. What are the causes? Most urinary tract infections are caused by bacteria in your genital area around your urethra, where urine leaves your body. These bacteria grow and cause inflammation of your urinary tract. What increases the risk? You are more likely to develop this condition if: You have a urinary catheter that stays in place. You are not able to control when you urinate or have a bowel movement (incontinence). You are female and you: Use a spermicide or diaphragm for birth control. Have low estrogen levels. Are pregnant. You have certain genes that increase your risk. You are sexually active. You take antibiotic medicines. You have a condition that causes your flow of urine to slow down, such as: An enlarged prostate, if you are female. Blockage in your urethra. A kidney stone. A nerve condition that affects your bladder control (neurogenic bladder). Not getting enough to drink, or not urinating often. You have certain medical conditions, such as: Diabetes. A weak disease-fighting system (immunesystem). Sickle cell disease. Gout. Spinal cord injury. What are the signs or symptoms? Symptoms of this condition include: Needing to urinate right away (urgency). Frequent urination. This may include small amounts of urine each time you urinate. Pain or burning with urination. Blood in the urine. Urine that smells bad or unusual. Trouble urinating. Cloudy urine. Vaginal discharge, if you are female. Pain in the abdomen or the lower back. You may also have: Vomiting or a decreased appetite. Confusion. Irritability or tiredness. A fever or  chills. Diarrhea. The first symptom in older adults may be confusion. In some cases, they may not have any symptoms until the infection has worsened. How is this diagnosed? This condition is diagnosed based on your medical history and a physical exam. You may also have other tests, including: Urine tests. Blood tests. Tests for STIs (sexually transmitted infections). If you have had more than one UTI, a cystoscopy or imaging studies may be done to determine the cause of the infections. How is this treated? Treatment for this condition includes: Antibiotic medicine. Over-the-counter medicines to treat discomfort. Drinking enough water to stay hydrated. If you have frequent infections or have other conditions such as a kidney stone, you may need to see a health care provider who specializes in the urinary tract (urologist). In rare cases, urinary tract infections can cause sepsis. Sepsis is a life-threatening condition that occurs when the body responds to an infection. Sepsis is treated in the hospital with IV antibiotics, fluids, and other medicines. Follow these instructions at home: Medicines Take over-the-counter and prescription medicines only as told by your health care provider. If you were prescribed an antibiotic medicine, take it as told by your health care provider. Do not stop using the antibiotic even if you start to feel better. General instructions Make sure you: Empty your bladder often and completely. Do not hold urine for long periods of time. Empty your bladder after sex. Wipe from front to back after urinating or having a bowel movement if you are female. Use each tissue only one time when you wipe. Drink enough fluid to keep your urine pale yellow. Keep all follow-up visits. This is important. Contact a health care provider   if: Your symptoms do not get better after 1-2 days. Your symptoms go away and then return. Get help right away if: You have severe pain in your  back or your lower abdomen. You have a fever or chills. You have nausea or vomiting. Summary A urinary tract infection (UTI) is an infection of any part of the urinary tract, which includes the kidneys, ureters, bladder, and urethra. Most urinary tract infections are caused by bacteria in your genital area. Treatment for this condition often includes antibiotic medicines. If you were prescribed an antibiotic medicine, take it as told by your health care provider. Do not stop using the antibiotic even if you start to feel better. Keep all follow-up visits. This is important. This information is not intended to replace advice given to you by your health care provider. Make sure you discuss any questions you have with your health care provider. Document Revised: 10/30/2019 Document Reviewed: 10/30/2019 Elsevier Patient Education  2022 Elsevier Inc.  

## 2021-07-11 ENCOUNTER — Telehealth: Payer: Self-pay | Admitting: Pharmacist

## 2021-07-11 ENCOUNTER — Encounter: Payer: Self-pay | Admitting: Hematology and Oncology

## 2021-07-11 NOTE — Telephone Encounter (Signed)
Stephanie Brewer  ?     Telephone: [phone]?Fax: [phone]  ? ?Oncology Clinical Pharmacist Practitioner Progress Note ? ?Stephanie Brewer is a 56 y.o. female with a diagnosis of breast cancer currently on extended adjuvant neratinib which was started on 12/24/21 under the care of Dr. Nicholas Lose. They were contacted today via telephone. ? ?Interval History ?Clinical pharmacy received a refill request from Dr. Geralyn Flash nurse for neratinib that was initiated by [facility], her mail order pharmacy.  She has been on this medication since 12/24/20 and it is planned for a year in the extended adjuvant setting. We most recently spoke to her when she had contacted the clinic on 06/21/21 stating she was having trouble urinating.  It was recommended at that time that she be seen urgently and the same day she was seen by her PCP office [name]).  Stephanie Brewer states she was prescribed Bactrim on that date.  Stephanie Brewer states she saw [name] yesterday for similar symptoms and was prescribed Bactrim again.  We discussed with Stephanie Brewer that in the past, she has had issues with diarrhea when on neratinib and antibiotics. We discussed holding neratinib until her antibiotics are finished. There is about a 5% chance of UTI while on neratinib in the literature. Stephanie Brewer reports no diarrhea or side effects attributed to the neratinib with her last Bactrim prescription and feels strongly that she would like to continue neratinib at this time. She states that she has about 3 weeks left of neratinib and knows to contact the clinic immediately should she start having any symptoms attributed to neratinib.  A urine culture is pending that was ordered by her PCP office yesterday and they will be managing the follow up regarding her current UTI symptoms.  Stephanie Brewer has her next follow up with medical oncology with [name] on 08/24/21 and knows to contact the clinic in the interim with any questions or  concerns. ? ?Follow-Up Plan ?Refill for neratinib sent to [facility] with start date in 3 weeks (08/01/21) ?Stephanie Brewer will continue to take Bactrim as prescribed by PCP office [name]). Urine culture pending that was ordered by PCP and they are managing follow up for possible UTI.  ?Lab and [name] visit on 08/24/21 -- Stephanie Brewer will contact Dr. Geralyn Flash clinic with any questions or concerns in the interim ? ?Stephanie Brewer participated in the discussion, expressed understanding, and voiced agreement with the above plan. All questions were answered to her satisfaction. The patient was advised to contact the clinic at [phone] with any questions or concerns prior to her return visit. ? ?Clinical pharmacy will continue to support Stephanie Brewer and Dr. Nicholas Lose as needed. ? ?I spent 15 minutes assessing the patient. ? ?[name], RPH-CPP,  ?07/11/2021  8:54 AM  ? ?**Disclaimer: This note was dictated with voice recognition software. Similar sounding words can inadvertently be transcribed and this note may contain transcription errors which may not have been corrected upon publication of note.** ? ?

## 2021-07-14 LAB — URINE CULTURE

## 2021-07-17 ENCOUNTER — Other Ambulatory Visit: Payer: Self-pay | Admitting: Family Medicine

## 2021-07-17 ENCOUNTER — Encounter: Payer: Self-pay | Admitting: Pharmacist

## 2021-07-17 DIAGNOSIS — N3000 Acute cystitis without hematuria: Secondary | ICD-10-CM

## 2021-07-17 MED ORDER — CEPHALEXIN 500 MG PO CAPS: 500.0000 mg | ORAL_CAPSULE | Freq: Two times a day (BID) | ORAL | 0 refills | Status: DC

## 2021-07-24 ENCOUNTER — Ambulatory Visit: Payer: BC Managed Care – PPO | Attending: Hematology and Oncology

## 2021-07-24 VITALS — Wt 171.5 lb

## 2021-07-24 DIAGNOSIS — Z483 Aftercare following surgery for neoplasm: Secondary | ICD-10-CM | POA: Insufficient documentation

## 2021-07-24 NOTE — Therapy (Signed)
?OUTPATIENT PHYSICAL THERAPY SOZO SCREENING NOTE ? ? ?Patient Name: Stephanie Brewer ?MRN: [id number] ?DOB:1965/07/03, 56 y.o., female ?Today's Date: 07/24/2021 ? ?PCP: [name], DO ?REFERRING PROVIDER: Nicholas Lose, MD ? ? PT End of Session - 07/24/21 0907   ? ? Visit Number 2   # unchanged due to screen only  ? PT Start Time [phone]   ? PT Stop Time 0909   ? PT Time Calculation (min) 5 min   ? Activity Tolerance Patient tolerated treatment well   ? Behavior During Therapy [facility] for tasks assessed/performed   ? ?  ?  ? ?  ? ? ?Past Medical History:  ?Diagnosis Date  ? Cancer [facility])   ? DVT (deep venous thrombosis) ([place])   ? left leg knee and ankle   ? GERD (gastroesophageal reflux disease)   ? ?Past Surgical History:  ?Procedure Laterality Date  ? BREAST IMPLANT REMOVAL Bilateral 01/14/2020  ? Procedure: REMOVAL BREAST IMPLANTS;  Surgeon: Cindra Presume, MD;  Location: [place];  Service: Plastics;  Laterality: Bilateral;  ? BREAST LUMPECTOMY WITH RADIOACTIVE SEED AND SENTINEL LYMPH NODE BIOPSY Right 01/14/2020  ? Procedure: Right breast seed localized lumpectomy with sentinel lymph node biopsy;  Surgeon: Rolm Bookbinder, MD;  Location: [place];  Service: General;  Laterality: Right;  ? BREAST SURGERY    ? Augmentation 2003  ? HERNIA REPAIR  2002  ? LIPOSUCTION WITH LIPOFILLING Bilateral 11/29/2020  ? Procedure: LIPOSUCTION WITH LIPOFILLING FROM ABDOMEN TO BILATERAL BREASTS;  Surgeon: Cindra Presume, MD;  Location: [place];  Service: Plastics;  Laterality: Bilateral;  ? MASTOPEXY Left 11/29/2020  ? Procedure: LEFT MASTOPEXY;  Surgeon: Cindra Presume, MD;  Location: [place];  Service: Plastics;  Laterality: Left;  ? PORTACATH PLACEMENT N/A 09/01/2019  ? Procedure: INSERTION PORT-A-CATH WITH ULTRASOUND GUIDANCE;  Surgeon: Rolm Bookbinder, MD;  Location: [place];  Service: General;  Laterality: N/A;  ? TRIGGER FINGER  RELEASE Left 05/09/2020  ? Procedure: RELEASE TRIGGER FINGER/A-1 PULLEY LEFT LONG;  Surgeon: Leanora Cover, MD;  Location: [place];  Service: Orthopedics;  Laterality: Left;  ? ?Patient Active Problem List  ? Diagnosis Date Noted  ? Port-A-Cath in place 09/09/2019  ? Malignant neoplasm of upper-outer quadrant of right breast in female, estrogen receptor positive ([place]) 08/20/2019  ? Radicular leg pain 02/04/2018  ? Numbness and tingling 02/04/2018  ? Foot pain, left 02/04/2018  ? Displacement of breast implant 12/24/2017  ? H/O bilateral breast implants 12/24/2017  ? GERD (gastroesophageal reflux disease) 12/06/2015  ? Gluten intolerance 12/06/2015  ? ? ?REFERRING DIAG: right breast cancer at risk for lymphedema ? ?THERAPY DIAG:  ?Aftercare following surgery for neoplasm ? ?PERTINENT HISTORY: R breast cancer, grade 2 invasive ductal carcinoma, ER +, PR-, HER2+, beginning chemo next week 09/02/19, 01/15/20- R lumpectomy and SLNB (4 nodes all negative), will begin radiation 02/15/20,  hx of LLE DVT following foot surgery for bunion, acute DVT in LUE and in LLE diagnosed on 12/16/20  ? ?PRECAUTIONS: right UE Lymphedema risk, None ? ?SUBJECTIVE: Pt returns for her 3 month L-Dex screen.  ? ?PAIN:  ?Are you having pain? No ? ?SOZO SCREENING: ?Patient was assessed today using the SOZO machine to determine the lymphedema index score. This was compared to her baseline score. It was determined that she is within the recommended range when compared to her baseline and no further action is needed at this time.  She will continue SOZO screenings. These are done every 3 months for 2 years post operatively followed by every 6 months for 2 years, and then annually. ? ? ? ?[name], PTA ?07/24/2021, 9:10 AM ? ?  ? ?

## 2021-07-25 ENCOUNTER — Other Ambulatory Visit: Payer: Self-pay | Admitting: Hematology and Oncology

## 2021-08-09 ENCOUNTER — Ambulatory Visit: Payer: BC Managed Care – PPO | Admitting: Plastic Surgery

## 2021-08-09 ENCOUNTER — Encounter: Payer: Self-pay | Admitting: Plastic Surgery

## 2021-08-09 DIAGNOSIS — N650 Deformity of reconstructed breast: Secondary | ICD-10-CM | POA: Diagnosis not present

## 2021-08-09 DIAGNOSIS — C50411 Malignant neoplasm of upper-outer quadrant of right female breast: Secondary | ICD-10-CM

## 2021-08-09 DIAGNOSIS — Z170 Estrogen receptor positive status [ER+]: Secondary | ICD-10-CM

## 2021-08-09 NOTE — Progress Notes (Signed)
? ?  Referring Provider ?[name], DO ?[address] [place],  [place] [address]  ? ?CC:  ?Chief Complaint  ?Patient presents with  ? Follow-up  ?   ? ?Stephanie Brewer is an 56 y.o. female.  ?HPI: Patient presents about 8 months out from left mastopexy along with fat grafting to bilateral breast upper pole.  She feels that she got a nice improvement from that procedure.  She still feels that the upper pole fullness could be improved upon.  She is going through an oral chemotherapy drug at the moment that has been tough on her system she is interested in an additional fat grafting procedure sometime in the fall. ? ?Review of Systems ?General: Denies fever and chills ? ?Physical Exam ? ?  07/24/2021  ?  9:06 AM 07/10/2021  ?  9:46 AM 06/21/2021  ? 10:49 AM  ?Vitals with BMI  ?Height  '5\' 4"'$  '5\' 4"'$   ?Weight 171 lbs 8 oz 167 lbs 10 oz 172 lbs 8 oz  ?BMI 29.42 28.75 29.59  ?Systolic  175 102  ?Diastolic  80 71  ?Pulse  90 68  ?  ?General:  No acute distress,  Alert and oriented, Non-Toxic, Normal speech and affect ?Examination shows a nice symmetric result at the moment.  She may have more overall volume on the right side than the left side at this point.  There is still some asymmetries in terms of the volume distribution in the lower pole and there is still some room for upper pole volume improvement as well on both sides. ? ?Assessment/Plan ?Patient presents with persistent contour irregularities and asymmetries after breast conservation therapy for right-sided cancer.  She is interested in a second stage fat grafting procedure for volume enhancement and asymmetry correction and I think that is a reasonable plan.  We discussed limitations of the procedure and we discussed risks include bleeding, infection, damage to surrounding structures need for additional procedures.  We will plan to see her back at the end of the summer with the hope to do this procedure after she has had time to recover from ending her current  chemotherapy regimen.  All her questions were answered. ? ?Cindra Presume ?08/09/2021, 10:16 AM  ? ? ?    ?

## 2021-08-14 ENCOUNTER — Other Ambulatory Visit: Payer: Self-pay | Admitting: Hematology and Oncology

## 2021-08-14 ENCOUNTER — Encounter: Payer: Self-pay | Admitting: Hematology and Oncology

## 2021-08-14 ENCOUNTER — Ambulatory Visit
Admission: RE | Admit: 2021-08-14 | Discharge: 2021-08-14 | Disposition: A | Discharge status: Home / Self Care | Payer: BC Managed Care – PPO | Source: Ambulatory Visit | Attending: Hematology and Oncology | Admitting: Hematology and Oncology

## 2021-08-14 DIAGNOSIS — N6320 Unspecified lump in the left breast, unspecified quadrant: Secondary | ICD-10-CM

## 2021-08-14 DIAGNOSIS — N6322 Unspecified lump in the left breast, upper inner quadrant: Secondary | ICD-10-CM | POA: Diagnosis not present

## 2021-08-14 DIAGNOSIS — Z170 Estrogen receptor positive status [ER+]: Secondary | ICD-10-CM

## 2021-08-15 ENCOUNTER — Ambulatory Visit
Admission: RE | Admit: 2021-08-15 | Discharge: 2021-08-15 | Disposition: A | Discharge status: Home / Self Care | Payer: BC Managed Care – PPO | Source: Ambulatory Visit | Attending: Hematology and Oncology | Admitting: Hematology and Oncology

## 2021-08-15 ENCOUNTER — Encounter: Payer: Self-pay | Admitting: Family Medicine

## 2021-08-15 ENCOUNTER — Ambulatory Visit: Payer: BC Managed Care – PPO | Admitting: Family Medicine

## 2021-08-15 VITALS — BP 119/80 | HR 79 | Temp 98.2°F | Ht 64.0 in | Wt 168.8 lb

## 2021-08-15 DIAGNOSIS — C50411 Malignant neoplasm of upper-outer quadrant of right female breast: Secondary | ICD-10-CM

## 2021-08-15 DIAGNOSIS — R7303 Prediabetes: Secondary | ICD-10-CM | POA: Diagnosis not present

## 2021-08-15 DIAGNOSIS — N6322 Unspecified lump in the left breast, upper inner quadrant: Secondary | ICD-10-CM | POA: Diagnosis not present

## 2021-08-15 DIAGNOSIS — R739 Hyperglycemia, unspecified: Secondary | ICD-10-CM

## 2021-08-15 DIAGNOSIS — N6320 Unspecified lump in the left breast, unspecified quadrant: Secondary | ICD-10-CM

## 2021-08-15 DIAGNOSIS — N641 Fat necrosis of breast: Secondary | ICD-10-CM | POA: Diagnosis not present

## 2021-08-15 DIAGNOSIS — E7800 Pure hypercholesterolemia, unspecified: Secondary | ICD-10-CM

## 2021-08-15 DIAGNOSIS — E663 Overweight: Secondary | ICD-10-CM | POA: Insufficient documentation

## 2021-08-15 DIAGNOSIS — Z170 Estrogen receptor positive status [ER+]: Secondary | ICD-10-CM | POA: Diagnosis not present

## 2021-08-15 HISTORY — PX: BREAST BIOPSY: SHX20

## 2021-08-15 LAB — BAYER DCA HB A1C WAIVED: HB A1C (BAYER DCA - WAIVED): 5.9 % — ABNORMAL HIGH (ref 4.8–5.6)

## 2021-08-15 NOTE — Progress Notes (Signed)
? ?Subjective: ?XV:[id number] ?PCP: [name], DO ?PYP:PJKD Stephanie Brewer is a 56 y.o. female presenting to clinic today for: ? ?1.  Prediabetes ?Patient here for follow-up on prediabetes.  This was noted on most recent laboratory draw in November to be A1c of 6.0.  She wonders if it is medication induced.  She is been on the Nerlynx since September of last year.  Sometimes she will feel like she has to urinate more she eats certain foods.  Otherwise denies any polydipsia, polyuria, visual disturbance. ? ?2.  Breast cancer ?Patient is status post treatment for breast cancer on the right.  She may be undergoing a second surgery for reconstruction of that right breast in the fall but notes that she in fact has a new lesion on the left breast of undetermined significance.  She will be undergoing biopsy in the next day or so.  She of course is anxious about this as her previous exam last year was clear. ? ? ?ROS: Per HPI ? ?Allergies  ?Allergen Reactions  ? Hyoscyamine Anaphylaxis  ? Doxycycline   ?  Racing heart.  ? Nexium [Esomeprazole]   ?  Swollen throat  ? ?Past Medical History:  ?Diagnosis Date  ? Cancer [facility])   ? DVT (deep venous thrombosis) ([place])   ? left leg knee and ankle   ? GERD (gastroesophageal reflux disease)   ? ? ?Current Outpatient Medications:  ?  anastrozole (ARIMIDEX) 1 MG tablet, TAKE ONE TABLET ONCE DAILY, Disp: 90 tablet, Rfl: 3 ?  calcium-vitamin D (OSCAL WITH D) 500-200 MG-UNIT tablet, Take 1 tablet by mouth in the morning and at bedtime., Disp: , Rfl:  ?  diphenoxylate-atropine (LOMOTIL) 2.5-0.025 MG tablet, Take 2 tablets by mouth 4 (four) times daily as needed for diarrhea or loose stools., Disp: 30 tablet, Rfl: 2 ?  glucose blood test strip, UAD to check sugar. R73.03, Disp: 100 each, Rfl: 12 ?  Lancets Thin MISC, UAD to check sugar. R73.03, Disp: 100 each, Rfl: 12 ?  loperamide (IMODIUM) 2 MG capsule, Take 2 mg by mouth as needed for diarrhea or loose stools. Take 2 tabs (4 mg) with  first loose stool, then 1 tab (2 mg) with each additional loose stool as needed. Do not take more than 8 tabs (16 mg) in a 24 hour period., Disp: , Rfl:  ?  NERLYNX 40 MG tablet, TAKE 4 TABLETS DAILY WITH FOOD, Disp: 112 tablet, Rfl: 3 ?  rivaroxaban (XARELTO) 20 MG TABS tablet, Take 1 tablet (20 mg total) by mouth daily with supper., Disp: 30 tablet, Rfl: 12 ?No current facility-administered medications for this visit. ? ?Facility-Administered Medications Ordered in Other Visits:  ?  sodium chloride flush (NS) 0.9 % injection 10 mL, 10 mL, Intravenous, PRN, Nicholas Lose, MD, 10 mL at 09/02/19 0839 ?Social History  ? ?Socioeconomic History  ? Marital status: Married  ?  Spouse name: Not on file  ? Number of children: 2  ? Years of education: some college  ? Highest education level: Not on file  ?Occupational History  ? Occupation: [employer]  ?Tobacco Use  ? Smoking status: Never  ? Smokeless tobacco: Never  ?Vaping Use  ? Vaping Use: Never used  ?Substance and Sexual Activity  ? Alcohol use: Not Currently  ?  Comment: rare-twice monthly or less  ? Drug use: No  ? Sexual activity: Yes  ?  Birth control/protection: I.U.D.  ?Other Topics Concern  ? Not on file  ?Social  History Narrative  ? Lives with spouse  ? Caffeine use: no caffeine   ? Left handed   ? ?Social Determinants of Health  ? ?Financial Resource Strain: Not on file  ?Food Insecurity: Not on file  ?Transportation Needs: Not on file  ?Physical Activity: Not on file  ?Stress: Not on file  ?Social Connections: Not on file  ?Intimate Partner Violence: Not on file  ? ?Family History  ?Problem Relation Age of Onset  ? Hypertension Father   ? ? ?Objective: ?Office vital signs reviewed. ?BP 119/80   Pulse 79   Temp 98.2 ?F (36.8 ?C)   Ht 5' 4"  (1.626 m)   Wt 168 lb 12.8 oz (76.6 kg)   SpO2 97%   BMI 28.97 kg/m?  ? ?Physical Examination:  ?General: Awake, alert, nontoxic female, No acute distress ?HEENT: sclera white, MMM ?Cardio: regular rate and  rhythm, S1S2 heard, no murmurs appreciated ?Pulm: clear to auscultation bilaterally, no wheezes, rhonchi or rales; normal work of breathing on room air ?Extremities: warm, well perfused, No edema, cyanosis or clubbing; +2 pulses bilaterally ? ?Assessment/ Plan: ?56 y.o. female  ? ?Elevated serum glucose - Plan: Bayer DCA Hb A1c Waived ? ?Pre-diabetes - Plan: Bayer DCA Hb A1c Waived, CMP14+EGFR ? ?Malignant neoplasm of upper-outer quadrant of right breast in female, estrogen receptor positive ([place]) - Plan: CMP14+EGFR ? ?Pure hypercholesterolemia - Plan: Lipid Panel, TSH ? ?Overweight (BMI 25.0-29.9) ? ?Might be a candidate for wegovy.  I'd like her to check in with Onc before making any decisions about meds.  Also, needs to make sure this is covered by her ins.  She certainly has multiple comorbidities that increase her risk of cardiovascular disease including hyperlipidemia, PreDm and overweight BMI.  I considered alternatives like metformin but given severe diarrhea that caused hematochezia with her current treatment that is necessary for treatment of her breast cancer I think that this would exacerbate the symptoms and potentially affect her ability to continue other medications safely.  For that reason I have recommended consideration of Wegovy ? ?No orders of the defined types were placed in this encounter. ? ?No orders of the defined types were placed in this encounter. ? ? ? ?[name], DO ?[place] ?[phone] ? ?

## 2021-08-15 NOTE — Patient Instructions (Signed)
Here is a guide to help us find out which weight loss medications will be covered by your insurance plan.  Please check out this web site  [email or web address] and follow the 3 simple steps.   There is also a phone number you can call if you do not have access to the Internet. [phone] (Monday- Friday 8am-8pm)  [employer] provides coverage information for more than 80% of the inquiries submitted!!  

## 2021-08-16 ENCOUNTER — Encounter: Payer: Self-pay | Admitting: Family Medicine

## 2021-08-16 LAB — CMP14+EGFR
ALT: 12 IU/L (ref 0–32)
AST: 14 IU/L (ref 0–40)
Albumin/Globulin Ratio: 1.7 (ref 1.2–2.2)
Albumin: 4.3 g/dL (ref 3.8–4.9)
Alkaline Phosphatase: 122 IU/L — ABNORMAL HIGH (ref 44–121)
BUN/Creatinine Ratio: 17 (ref 9–23)
BUN: 12 mg/dL (ref 6–24)
Bilirubin Total: 0.4 mg/dL (ref 0.0–1.2)
CO2: 36 mmol/L — ABNORMAL HIGH (ref 20–29)
Calcium: 9.5 mg/dL (ref 8.7–10.2)
Chloride: 104 mmol/L (ref 96–106)
Creatinine, Ser: 0.72 mg/dL (ref 0.57–1.00)
Globulin, Total: 2.5 g/dL (ref 1.5–4.5)
Glucose: 106 mg/dL — ABNORMAL HIGH (ref 70–99)
Potassium: 4.2 mmol/L (ref 3.5–5.2)
Sodium: 142 mmol/L (ref 134–144)
Total Protein: 6.8 g/dL (ref 6.0–8.5)
eGFR: 98 mL/min/{1.73_m2} (ref >59)

## 2021-08-16 LAB — LIPID PANEL
Chol/HDL Ratio: 2.6 ratio (ref 0.0–4.4)
Cholesterol, Total: 192 mg/dL (ref 100–199)
HDL: 73 mg/dL (ref >39)
LDL Chol Calc (NIH): 105 mg/dL — ABNORMAL HIGH (ref 0–99)
Triglycerides: 77 mg/dL (ref 0–149)
VLDL Cholesterol Cal: 14 mg/dL (ref 5–40)

## 2021-08-16 LAB — TSH: TSH: 1.31 u[IU]/mL (ref 0.450–4.500)

## 2021-08-17 NOTE — Progress Notes (Signed)
Patient Care Team: [name], DO as PCP - General (Family Medicine) Mauro Kaufmann, RN as Oncology Nurse Navigator Rockwell Germany, RN as Oncology Nurse Navigator Gwyndolyn Kaufman, RN (Inactive) as Registered Nurse Gwyndolyn Kaufman, RN (Inactive) as Registered Nurse Nicholas Lose, MD as Medical Oncologist (Hematology and Oncology) [name], RPH-CPP as Pharmacist (Hematology and Oncology)  DIAGNOSIS: No diagnosis found.  SUMMARY OF ONCOLOGIC HISTORY: Oncology History  Malignant neoplasm of upper-outer quadrant of right breast in female, estrogen receptor positive ([place])  07/28/2019 Initial Diagnosis   Screening mammogram detected right breast mass 11:30 position subareolar 1.3 cm, indeterminate 5 mm mass: Biopsy fibroadenoma, right axillary lymph node present, biopsy of the lymph node and the mass revealed grade 2 IDC ER 10%, PR 0%, Ki-67 45%, HER-2 +3+ by [facility]   08/20/2019 Cancer Staging   Staging form: Breast, AJCC 8th Edition - Clinical stage from 08/20/2019: Stage IIA (cT2, cN1, cM0, G2, ER+, PR-, HER2+) - Signed by Eppie Gibson, MD on 01/26/2020    09/02/2019 - 12/18/2019 Chemotherapy   The patient had dexamethasone (DECADRON) 4 MG tablet, 4 mg (100 % of original dose 4 mg), Oral, Daily, 1 of 1 cycle, Start date: 08/20/2019, End date: 01/06/2020 Dose modification: 4 mg (original dose 4 mg, Cycle 0) palonosetron (ALOXI) injection 0.25 mg, 0.25 mg, Intravenous,  Once, 6 of 6 cycles Administration: 0.25 mg (09/02/2019), 0.25 mg (09/23/2019), 0.25 mg (11/25/2019), 0.25 mg (12/16/2019), 0.25 mg (10/14/2019), 0.25 mg (11/04/2019) pegfilgrastim-jmdb (FULPHILA) injection 6 mg, 6 mg, Subcutaneous,  Once, 6 of 6 cycles Administration: 6 mg (09/04/2019), 6 mg (09/25/2019), 6 mg (11/27/2019), 6 mg (12/18/2019), 6 mg (10/16/2019), 6 mg (11/06/2019) CARBOplatin (PARAPLATIN) 700 mg in sodium chloride 0.9 % 250 mL chemo infusion, 700 mg (100 % of original dose 700 mg), Intravenous,  Once, 6 of  6 cycles Dose modification: 700 mg (original dose 700 mg, Cycle 1), 700 mg (original dose 700 mg, Cycle 5), 600 mg (original dose 700 mg, Cycle 5, Reason: Other (see comments), Comment: dose reduce to 600 mg for CINV per [name]) Administration: 700 mg (09/02/2019), 700 mg (09/23/2019), 600 mg (11/25/2019), 600 mg (12/16/2019), 700 mg (10/14/2019), 600 mg (11/04/2019) DOCEtaxel (TAXOTERE) 150 mg in sodium chloride 0.9 % 250 mL chemo infusion, 75 mg/m2 = 150 mg, Intravenous,  Once, 6 of 6 cycles Dose modification: 50 mg/m2 (original dose 75 mg/m2, Cycle 5, Reason: Dose not tolerated), 50 mg/m2 (original dose 75 mg/m2, Cycle 4, Reason: Dose not tolerated) Administration: 150 mg (09/02/2019), 150 mg (09/23/2019), 100 mg (11/25/2019), 100 mg (12/16/2019), 150 mg (10/14/2019), 100 mg (11/04/2019) fosaprepitant (EMEND) 150 mg in sodium chloride 0.9 % 145 mL IVPB, 150 mg, Intravenous,  Once, 6 of 6 cycles Administration: 150 mg (09/02/2019), 150 mg (09/23/2019), 150 mg (11/25/2019), 150 mg (12/16/2019), 150 mg (10/14/2019), 150 mg (11/04/2019) pertuzumab (PERJETA) 420 mg in sodium chloride 0.9 % 250 mL chemo infusion, 420 mg (100 % of original dose 420 mg), Intravenous, Once, 5 of 5 cycles Dose modification: 420 mg (original dose 420 mg, Cycle 1, Reason: Provider Judgment) Administration: 420 mg (09/02/2019), 420 mg (09/23/2019), 420 mg (11/25/2019), 420 mg (10/14/2019), 420 mg (11/04/2019) trastuzumab-dkst (OGIVRI) 672 mg in sodium chloride 0.9 % 250 mL chemo infusion, 8 mg/kg = 672 mg, Intravenous,  Once, 6 of 6 cycles Administration: 672 mg (09/02/2019), 504 mg (09/23/2019), 504 mg (11/25/2019), 504 mg (12/16/2019), 504 mg (10/14/2019), 504 mg (11/04/2019)   for chemotherapy treatment.  01/06/2020 - 08/24/2020 Chemotherapy          01/14/2020 Surgery   Right lumpectomy Donne Hazel): no evidence of residual carcinoma, 4 right axillary lymph nodes negative for carcinoma.   02/16/2020 - 03/30/2020 Radiation Therapy   Adjuvant  radiation     CHIEF COMPLIANT:  Follow-up of right breast cancer  INTERVAL HISTORY: Stephanie Brewer is a 56 y.o. with above-mentioned history of right breast cancer who completed neoadjuvant chemotherapy, underwent a right lumpectomy, radiation, and is currently on Herceptin maintenance. She presents to the clinic today for follow-up.    ALLERGIES:  is allergic to hyoscyamine, doxycycline, and nexium [esomeprazole].  MEDICATIONS:  Current Outpatient Medications  Medication Sig Dispense Refill   anastrozole (ARIMIDEX) 1 MG tablet TAKE ONE TABLET ONCE DAILY 90 tablet 3   calcium-vitamin D (OSCAL WITH D) 500-200 MG-UNIT tablet Take 1 tablet by mouth in the morning and at bedtime.     diphenoxylate-atropine (LOMOTIL) 2.5-0.025 MG tablet Take 2 tablets by mouth 4 (four) times daily as needed for diarrhea or loose stools. 30 tablet 2   glucose blood test strip UAD to check sugar. R73.03 100 each 12   Lancets Thin MISC UAD to check sugar. R73.03 100 each 12   loperamide (IMODIUM) 2 MG capsule Take 2 mg by mouth as needed for diarrhea or loose stools. Take 2 tabs (4 mg) with first loose stool, then 1 tab (2 mg) with each additional loose stool as needed. Do not take more than 8 tabs (16 mg) in a 24 hour period.     NERLYNX 40 MG tablet TAKE 4 TABLETS DAILY WITH FOOD 112 tablet 3   rivaroxaban (XARELTO) 20 MG TABS tablet Take 1 tablet (20 mg total) by mouth daily with supper. 30 tablet 12   No current facility-administered medications for this visit.    PHYSICAL EXAMINATION: ECOG PERFORMANCE STATUS: {CHL ONC ECOG PS:[phone]}  There were no vitals filed for this visit. There were no vitals filed for this visit.  BREAST:*** No palpable masses or nodules in either right or left breasts. No palpable axillary supraclavicular or infraclavicular adenopathy no breast tenderness or nipple discharge. (exam performed in the presence of a chaperone)  LABORATORY DATA:  I have reviewed the data as  listed    Latest Ref Rng & Units 08/15/2021    9:59 AM 05/24/2021   10:04 AM 03/23/2021    9:26 AM  CMP  Glucose 70 - 99 mg/dL 106   102   103    BUN 6 - 24 mg/dL _0 Creatinine 0.57 - 1.00 mg/dL 0.72   0.79   0.72    Sodium 134 - 144 mmol/L 142   139   140    Potassium 3.5 - 5.2 mmol/L 4.2   4.2   3.9    Chloride 96 - 106 mmol/L 104   105   106    CO2 20 - 29 mmol/L 36   30   28    Calcium 8.7 - 10.2 mg/dL 9.5   9.2   9.4    Total Protein 6.0 - 8.5 g/dL 6.8   6.7   7.0    Total Bilirubin 0.0 - 1.2 mg/dL 0.4   0.5   0.6    Alkaline Phos 44 - 121 IU/L 122   99   98    AST 0 - 40 IU/L 14   12   15  ALT 0 - 32 IU/L _0 Lab Results  Component Value Date   WBC 4.1 05/24/2021   HGB 12.4 05/24/2021   HCT 37.5 05/24/2021   MCV 86.2 05/24/2021   PLT 228 05/24/2021   NEUTROABS 2.7 05/24/2021    ASSESSMENT & PLAN:  No problem-specific Assessment & Plan notes found for this encounter.    No orders of the defined types were placed in this encounter.  The patient has a good understanding of the overall plan. she agrees with it. she will call with any problems that may develop before the next visit here. Total time spent: 30 mins including face to face time and time spent for planning, charting and co-ordination of care   Suzzette Righter, [place] 08/17/21    I Gardiner Coins am scribing for [name]  ***

## 2021-08-23 ENCOUNTER — Other Ambulatory Visit: Payer: Self-pay | Admitting: *Deleted

## 2021-08-23 DIAGNOSIS — C50411 Malignant neoplasm of upper-outer quadrant of right female breast: Secondary | ICD-10-CM

## 2021-08-24 ENCOUNTER — Other Ambulatory Visit: Payer: Self-pay

## 2021-08-24 ENCOUNTER — Inpatient Hospital Stay: Payer: BC Managed Care – PPO | Admitting: Hematology and Oncology

## 2021-08-24 ENCOUNTER — Encounter: Payer: Self-pay | Admitting: Family Medicine

## 2021-08-24 ENCOUNTER — Other Ambulatory Visit: Payer: Self-pay | Admitting: Hematology and Oncology

## 2021-08-24 ENCOUNTER — Inpatient Hospital Stay: Payer: BC Managed Care – PPO | Attending: Hematology and Oncology

## 2021-08-24 ENCOUNTER — Encounter: Payer: Self-pay | Admitting: Emergency Medicine

## 2021-08-24 ENCOUNTER — Other Ambulatory Visit: Payer: Self-pay | Admitting: Family Medicine

## 2021-08-24 DIAGNOSIS — C50411 Malignant neoplasm of upper-outer quadrant of right female breast: Secondary | ICD-10-CM | POA: Diagnosis not present

## 2021-08-24 DIAGNOSIS — Z9221 Personal history of antineoplastic chemotherapy: Secondary | ICD-10-CM | POA: Insufficient documentation

## 2021-08-24 DIAGNOSIS — Z170 Estrogen receptor positive status [ER+]: Secondary | ICD-10-CM | POA: Diagnosis not present

## 2021-08-24 DIAGNOSIS — R7303 Prediabetes: Secondary | ICD-10-CM

## 2021-08-24 DIAGNOSIS — Z923 Personal history of irradiation: Secondary | ICD-10-CM | POA: Insufficient documentation

## 2021-08-24 DIAGNOSIS — E7800 Pure hypercholesterolemia, unspecified: Secondary | ICD-10-CM

## 2021-08-24 DIAGNOSIS — R197 Diarrhea, unspecified: Secondary | ICD-10-CM | POA: Insufficient documentation

## 2021-08-24 DIAGNOSIS — E663 Overweight: Secondary | ICD-10-CM

## 2021-08-24 LAB — CBC WITH DIFFERENTIAL (CANCER CENTER ONLY)
Abs Immature Granulocytes: 0.01 10*3/uL (ref 0.00–0.07)
Basophils Absolute: 0.1 10*3/uL (ref 0.0–0.1)
Basophils Relative: 1 %
Eosinophils Absolute: 0.2 10*3/uL (ref 0.0–0.5)
Eosinophils Relative: 5 %
HCT: 35.5 % — ABNORMAL LOW (ref 36.0–46.0)
Hemoglobin: 11.7 g/dL — ABNORMAL LOW (ref 12.0–15.0)
Immature Granulocytes: 0 %
Lymphocytes Relative: 21 %
Lymphs Abs: 0.8 10*3/uL (ref 0.7–4.0)
MCH: 28.9 pg (ref 26.0–34.0)
MCHC: 33 g/dL (ref 30.0–36.0)
MCV: 87.7 fL (ref 80.0–100.0)
Monocytes Absolute: 0.3 10*3/uL (ref 0.1–1.0)
Monocytes Relative: 9 %
Neutro Abs: 2.5 10*3/uL (ref 1.7–7.7)
Neutrophils Relative %: 64 %
Platelet Count: 228 10*3/uL (ref 150–400)
RBC: 4.05 MIL/uL (ref 3.87–5.11)
RDW: 14.6 % (ref 11.5–15.5)
WBC Count: 3.9 10*3/uL — ABNORMAL LOW (ref 4.0–10.5)
nRBC: 0 % (ref 0.0–0.2)

## 2021-08-24 LAB — CMP (CANCER CENTER ONLY)
ALT: 7 U/L (ref 0–44)
AST: 11 U/L — ABNORMAL LOW (ref 15–41)
Albumin: 3.8 g/dL (ref 3.5–5.0)
Alkaline Phosphatase: 98 U/L (ref 38–126)
Anion gap: 5 (ref 5–15)
BUN: 12 mg/dL (ref 6–20)
CO2: 30 mmol/L (ref 22–32)
Calcium: 9 mg/dL (ref 8.9–10.3)
Chloride: 106 mmol/L (ref 98–111)
Creatinine: 0.79 mg/dL (ref 0.44–1.00)
GFR, Estimated: >60 mL/min (ref >60)
Glucose, Bld: 99 mg/dL (ref 70–99)
Potassium: 3.8 mmol/L (ref 3.5–5.1)
Sodium: 141 mmol/L (ref 135–145)
Total Bilirubin: 0.6 mg/dL (ref 0.3–1.2)
Total Protein: 6.5 g/dL (ref 6.5–8.1)

## 2021-08-24 MED ORDER — WEGOVY 1.7 MG/0.75ML ~~LOC~~ SOAJ: 1.7000 mg | SUBCUTANEOUS | 0 refills | Status: DC

## 2021-08-24 MED ORDER — WEGOVY 0.5 MG/0.5ML ~~LOC~~ SOAJ: 0.5000 mg | SUBCUTANEOUS | 0 refills | Status: DC

## 2021-08-24 MED ORDER — WEGOVY 1 MG/0.5ML ~~LOC~~ SOAJ: 1.0000 mg | SUBCUTANEOUS | 0 refills | Status: DC

## 2021-08-24 MED ORDER — WEGOVY 0.25 MG/0.5ML ~~LOC~~ SOAJ: 0.2500 mg | SUBCUTANEOUS | 0 refills | Status: DC

## 2021-08-24 MED ORDER — NERATINIB MALEATE 40 MG PO TABS: 120.0000 mg | ORAL_TABLET | Freq: Every day | ORAL | 3 refills | Status: DC

## 2021-08-24 NOTE — Assessment & Plan Note (Addendum)
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 Neoadjuvant chemotherapy with [place] Perjeta 6 cyclescompleted 12/16/19 (Perjeta D/Ced due to diarrhea)followed by Herceptin maintenance for 1 year 2. 01/14/2020: Right lumpectomy: Benign, no evidence of residual cancer, 0/4 lymph nodes negative, additional margins also negative, complete pathologic response(pathologic complete response)completed Herceptin 08/24/2020 3. Followed by adjuvant radiation therapycompleted 03/30/2020 4.Antiestrogen therapy with anastrozole 1 mg daily x5 years starting 07/13/2020  Breast MRI: 3.8 cm tumor and 1 axillary lymphnode. ---------------------------------------------------------------------------------------------------------------------------------------------------- Treatment plan:Anastrozole 1 mg daily started 07/13/2020, neratinib started September 2022  Echocardiogram3/1/22: EF 65-70 % Trigger finger surgery: Successful  Anastrozoletoxicities:Denies any adverse effects to anastrozole therapy. Neratinib toxicities: Recurrent diarrhea: Patient has had episodes of no diarrhea but then suddenly once twice a week she gets profound diarrhea that does not stop even with 8 tablets of Lomotil.  Because of the onset Tennity with the diarrhea it is limiting her ability to do a lot. She had 3 episodes of UTIs which were treated with antibiotics.  During that time she had to stop neratinib.  Breast cancer surveillance: Mammogram 08/24/2021: Benign breast density category B Brain MRI 10/20/2020: Unremarkable Mammogram and ultrasound 08/14/2021: 5 mm suspicious mass left breast 11 o'clock position biopsy: Fat necrosis  She will complete 1 year  of neratinib by September 2023.  She will follow-up with Jenny Reichmann until then. Return to clinic to see me in 1 year.

## 2021-08-24 NOTE — Research (Signed)
S1714 - A Prospective Observational Cohort Study to Develop a Predictive Model of Taxane-Induced Peripheral Neuropathy in Cancer Patients  08/24/21 - Week 104  Patient presents to the clinic for her scheduled visit with [name].  Patient completed required PROs for this visit while in the clinic today (S1714 PRO-CTCAE, FACT/GOG-NTX-4, and B3967 EORTC QLQ-CIPN20).  These were checked for completeness by this research coordinator.    Patient reports no neuropathy symptoms.  Solicited neuropathy events form was completed by [name].  Clabe Seal Clinical Research Coordinator I  08/24/21 9:32 AM

## 2021-08-29 ENCOUNTER — Encounter: Payer: Self-pay | Admitting: *Deleted

## 2021-08-29 ENCOUNTER — Telehealth: Payer: Self-pay | Admitting: *Deleted

## 2021-08-29 NOTE — Telephone Encounter (Signed)
Stephanie Brewer (Key: [id number]) Rx #: [id number] 0.'25MG'$ /0.5ML auto-injectors  Sent to plan

## 2021-08-29 NOTE — Telephone Encounter (Signed)
AUGUSTINE BRANNICK (Key: [id number]) - [id number] [id number] 0.'25MG'$ /0.5ML auto-injectors Status: PA Response - Approved Created: May 25th, 2023 (336) [phone] Sent: May 30th, 2023

## 2021-09-20 DIAGNOSIS — Z6828 Body mass index (BMI) 28.0-28.9, adult: Secondary | ICD-10-CM | POA: Diagnosis not present

## 2021-09-20 DIAGNOSIS — Z01419 Encounter for gynecological examination (general) (routine) without abnormal findings: Secondary | ICD-10-CM | POA: Diagnosis not present

## 2021-09-26 ENCOUNTER — Other Ambulatory Visit: Payer: Self-pay | Admitting: Pharmacist

## 2021-09-26 DIAGNOSIS — C50411 Malignant neoplasm of upper-outer quadrant of right female breast: Secondary | ICD-10-CM

## 2021-09-26 MED ORDER — NERATINIB MALEATE 40 MG PO TABS: 120.0000 mg | ORAL_TABLET | Freq: Every day | ORAL | 3 refills | Status: DC

## 2021-09-29 DIAGNOSIS — Z170 Estrogen receptor positive status [ER+]: Secondary | ICD-10-CM | POA: Diagnosis not present

## 2021-09-29 DIAGNOSIS — C50411 Malignant neoplasm of upper-outer quadrant of right female breast: Secondary | ICD-10-CM | POA: Diagnosis not present

## 2021-10-02 ENCOUNTER — Ambulatory Visit
Admission: RE | Admit: 2021-10-02 | Discharge: 2021-10-02 | Disposition: A | Discharge status: Home / Self Care | Payer: BC Managed Care – PPO | Source: Ambulatory Visit | Attending: Hematology and Oncology | Admitting: Hematology and Oncology

## 2021-10-02 DIAGNOSIS — Z780 Asymptomatic menopausal state: Secondary | ICD-10-CM | POA: Diagnosis not present

## 2021-10-02 DIAGNOSIS — M810 Age-related osteoporosis without current pathological fracture: Secondary | ICD-10-CM | POA: Diagnosis not present

## 2021-10-02 DIAGNOSIS — M8588 Other specified disorders of bone density and structure, other site: Secondary | ICD-10-CM | POA: Diagnosis not present

## 2021-10-02 DIAGNOSIS — Z170 Estrogen receptor positive status [ER+]: Secondary | ICD-10-CM

## 2021-10-04 ENCOUNTER — Other Ambulatory Visit: Payer: Self-pay | Admitting: Hematology and Oncology

## 2021-10-04 MED ORDER — ALENDRONATE SODIUM 70 MG PO TABS: 70.0000 mg | ORAL_TABLET | ORAL | 3 refills | Status: DC

## 2021-10-04 NOTE — Progress Notes (Signed)
Discussed the results of bone density which showed a T score of -3: Osteoporosis I recommended the patient start bisphosphonate therapy with Fosamax. I sent a prescription to her pharmacy in [place]. She understands risks and benefits of Fosamax including the risk of osteonecrosis of the jaw as well as esophageal irritation if she lies down after taking the pill.

## 2021-11-01 ENCOUNTER — Ambulatory Visit: Payer: BC Managed Care – PPO | Admitting: Plastic Surgery

## 2021-11-01 ENCOUNTER — Other Ambulatory Visit: Payer: Self-pay | Admitting: Pharmacist

## 2021-11-01 DIAGNOSIS — C50411 Malignant neoplasm of upper-outer quadrant of right female breast: Secondary | ICD-10-CM

## 2021-11-01 MED ORDER — NERATINIB MALEATE 40 MG PO TABS: 120.0000 mg | ORAL_TABLET | Freq: Every day | ORAL | 1 refills | Status: DC

## 2021-11-10 ENCOUNTER — Encounter: Payer: Self-pay | Admitting: Plastic Surgery

## 2021-11-10 ENCOUNTER — Ambulatory Visit (INDEPENDENT_AMBULATORY_CARE_PROVIDER_SITE_OTHER): Payer: BC Managed Care – PPO | Admitting: Plastic Surgery

## 2021-11-10 DIAGNOSIS — Z923 Personal history of irradiation: Secondary | ICD-10-CM

## 2021-11-10 DIAGNOSIS — N651 Disproportion of reconstructed breast: Secondary | ICD-10-CM | POA: Diagnosis not present

## 2021-11-10 DIAGNOSIS — C50411 Malignant neoplasm of upper-outer quadrant of right female breast: Secondary | ICD-10-CM | POA: Diagnosis not present

## 2021-11-10 DIAGNOSIS — Z170 Estrogen receptor positive status [ER+]: Secondary | ICD-10-CM | POA: Diagnosis not present

## 2021-11-10 NOTE — Progress Notes (Addendum)
Subjective:    Patient ID: [name], female    DOB: 03-20-1966, 56 y.o.   MRN: [id number]  The patient is a 56 year old female here for evaluation of her breasts.  She underwent treatment for right breast cancer in 2021.  She had a partial mastectomy with postop radiation to the right breast.  At the time of her partial mastectomy she had bilateral implants removed.  They were subpectoral and she thinks they were around 450 cc.  In August 2022 she underwent a left breast mastopexy for symmetry with fat grafting.  She had 140 cc of fat placed on the left side and 160 cc placed on the right side.  She is here today with a request for more fat grafting.  She also had an abdominoplasty in the past.  In May 2023 she had ultrasound that was abnormal and required a biopsy which showed fat necrosis.  On exam she has pretty good symmetry.  She has changes from the radiation on the right breast.  She is on the smaller side compared to unsure what she was when she had 450 cc implants in place.  She noted that right after the surgery she liked the size but then really did not notice any difference within a few months from her preop.     Review of Systems  Constitutional: Negative.   Eyes: Negative.   Respiratory: Negative.  Negative for chest tightness.   Cardiovascular: Negative.   Gastrointestinal: Negative.   Endocrine: Negative.   Genitourinary: Negative.   Musculoskeletal: Negative.   Skin: Negative.        Objective:   Physical Exam Nursing note reviewed.  Constitutional:      Appearance: Normal appearance.  HENT:     Head: Normocephalic and atraumatic.  Cardiovascular:     Rate and Rhythm: Normal rate.     Pulses: Normal pulses.  Pulmonary:     Effort: No respiratory distress.  Abdominal:     General: There is no distension.     Palpations: Abdomen is soft.     Tenderness: There is no abdominal tenderness. There is no guarding.  Skin:    General: Skin is warm.      Coloration: Skin is not jaundiced.     Findings: No bruising.  Neurological:     Mental Status: She is alert and oriented to person, place, and time.  Psychiatric:        Mood and Affect: Mood normal.        Behavior: Behavior normal.        Thought Content: Thought content normal.        Judgment: Judgment normal.        Assessment & Plan:     ICD-10-CM   1. Malignant neoplasm of upper-outer quadrant of right breast in female, estrogen receptor positive ([place])  C50.411    Z17.0        My main concern with this patient is the radiation she had on the right breast and her expectation for size.  I think that 100 to 200 cc is possible to give her better contour but she is not going to get 400 cc of fat to survive and that breast to get her where she wants to be.  That would require implants.  The other factor is that she has had an abdominoplasty so she is going to have limited viable fat in the abdomen.  And she is wanting to do fat grafting for both  breasts which limits the amount of fat available for reconstructing the right side.  It also increases her risk of abnormal and mammograms on the left which she has already had and required a biopsy for.  Therefore I am asking if she would just think things over and lets do a telemetry visit and talk again in the next few weeks.  The patient is in agreement.

## 2021-11-14 ENCOUNTER — Telehealth: Payer: Self-pay

## 2021-11-14 ENCOUNTER — Encounter: Payer: Self-pay | Admitting: Plastic Surgery

## 2021-11-14 NOTE — Telephone Encounter (Signed)
error 

## 2021-11-15 ENCOUNTER — Ambulatory Visit (INDEPENDENT_AMBULATORY_CARE_PROVIDER_SITE_OTHER): Payer: BC Managed Care – PPO | Admitting: Family Medicine

## 2021-11-15 ENCOUNTER — Encounter: Payer: Self-pay | Admitting: Family Medicine

## 2021-11-15 VITALS — BP 106/75 | HR 90 | Temp 97.4°F | Ht 64.0 in | Wt 155.8 lb

## 2021-11-15 DIAGNOSIS — Z170 Estrogen receptor positive status [ER+]: Secondary | ICD-10-CM

## 2021-11-15 DIAGNOSIS — M810 Age-related osteoporosis without current pathological fracture: Secondary | ICD-10-CM | POA: Insufficient documentation

## 2021-11-15 DIAGNOSIS — R7303 Prediabetes: Secondary | ICD-10-CM | POA: Diagnosis not present

## 2021-11-15 DIAGNOSIS — C50411 Malignant neoplasm of upper-outer quadrant of right female breast: Secondary | ICD-10-CM | POA: Diagnosis not present

## 2021-11-15 DIAGNOSIS — E7800 Pure hypercholesterolemia, unspecified: Secondary | ICD-10-CM | POA: Diagnosis not present

## 2021-11-15 DIAGNOSIS — E663 Overweight: Secondary | ICD-10-CM | POA: Diagnosis not present

## 2021-11-15 MED ORDER — WEGOVY 2.4 MG/0.75ML ~~LOC~~ SOAJ: 2.4000 mg | SUBCUTANEOUS | 3 refills | Status: DC

## 2021-11-15 NOTE — Progress Notes (Signed)
Subjective: CC: Chronic follow-up, weight check PCP: [name], DO Stephanie Brewer is a 55 y.o. female presenting to clinic today for:  1.  Weight follow-up Patient reports that she is doing well and currently injecting 1 mg of the [facility] weekly.  She is not having any concerning symptoms including nausea, vomiting, abdominal pain or uncontrolled acid reflux except when she takes her Fosamax.  This was recently started as she was found to have osteoporosis.  Unfortunately given the use of Nerlynx for her breast cancer she is not a candidate for Prolia at this time but that medication may be changing this fall.   ROS: Per HPI  Allergies  Allergen Reactions   Hyoscyamine Anaphylaxis   Doxycycline     Racing heart.   Nexium [Esomeprazole]     Swollen throat   Past Medical History:  Diagnosis Date   Cancer ([place])    DVT (deep venous thrombosis) ([facility])    left leg knee and ankle    GERD (gastroesophageal reflux disease)     Current Outpatient Medications:    alendronate (FOSAMAX) 70 MG tablet, Take 1 tablet (70 mg total) by mouth once a week. Take with a full glass of water on an empty stomach., Disp: 12 tablet, Rfl: 3   anastrozole (ARIMIDEX) 1 MG tablet, TAKE ONE TABLET ONCE DAILY, Disp: 90 tablet, Rfl: 3   diphenoxylate-atropine (LOMOTIL) 2.5-0.025 MG tablet, TAKE TWO TABLETS FOUR TIMES DAILY AS NEEDED FOR DIARRHEA, Disp: 30 tablet, Rfl: 2   glucose blood test strip, UAD to check sugar. R73.03, Disp: 100 each, Rfl: 12   Lancets Thin MISC, UAD to check sugar. R73.03, Disp: 100 each, Rfl: 12   loperamide (IMODIUM) 2 MG capsule, Take 2 mg by mouth as needed for diarrhea or loose stools. Take 2 tabs (4 mg) with first loose stool, then 1 tab (2 mg) with each additional loose stool as needed. Do not take more than 8 tabs (16 mg) in a 24 hour period., Disp: , Rfl:    Neratinib Maleate (NERLYNX) 40 MG tablet, Take 3 tablets (120 mg total) by mouth daily. Take with food., Disp:  84 tablet, Rfl: 1   rivaroxaban (XARELTO) 20 MG TABS tablet, Take 1 tablet (20 mg total) by mouth daily with supper., Disp: 30 tablet, Rfl: 12   Semaglutide-Weight Management (WEGOVY) 1 MG/0.5ML SOAJ, Inject 1 mg into the skin every 7 (seven) days. Month #3, Disp: 2 mL, Rfl: 0   Semaglutide-Weight Management (WEGOVY) 1.7 MG/0.75ML SOAJ, Inject 1.7 mg into the skin every 7 (seven) days. Month#4, Disp: 3 mL, Rfl: 0   calcium-vitamin D (OSCAL WITH D) 500-200 MG-UNIT tablet, Take 1 tablet by mouth in the morning and at bedtime. (Patient not taking: Reported on 11/15/2021), Disp: , Rfl:  Social History   Socioeconomic History   Marital status: Married    Spouse name: Not on file   Number of children: 2   Years of education: some college   Highest education level: Not on file  Occupational History   Occupation: [employer]  Tobacco Use   Smoking status: Never   Smokeless tobacco: Never  Vaping Use   Vaping Use: Never used  Substance and Sexual Activity   Alcohol use: Not Currently    Comment: rare-twice monthly or less   Drug use: No   Sexual activity: Yes    Birth control/protection: I.U.D.  Other Topics Concern   Not on file  Social History Narrative   Lives  with spouse   Caffeine use: no caffeine    Left handed    Social Determinants of Health   Financial Resource Strain: Not on file  Food Insecurity: Not on file  Transportation Needs: Not on file  Physical Activity: Not on file  Stress: Not on file  Social Connections: Not on file  Intimate Partner Violence: Not on file   Family History  Problem Relation Age of Onset   Hypertension Father     Objective: Office vital signs reviewed. BP 106/75   Pulse 90   Temp (!) 97.4 F (36.3 C)   Ht '5\' 4"'$  (1.626 m)   Wt 155 lb 12.8 oz (70.7 kg)   SpO2 96%   BMI 26.74 kg/m   Physical Examination:  General: Awake, alert, well nourished, No acute distress HEENT: Sclera white.  Moist mucous membranes Cardio: regular rate  and rhythm, S1S2 heard, no murmurs appreciated Pulm: clear to auscultation bilaterally, no wheezes, rhonchi or rales; normal work of breathing on room air MSK: Ambulating independently with normal gait and station  Assessment/ Plan: 56 y.o. female   Overweight (BMI 25.0-29.9) - Plan: Semaglutide-Weight Management (WEGOVY) 2.4 MG/0.75ML SOAJ  Pure hypercholesterolemia - Plan: Semaglutide-Weight Management (WEGOVY) 2.4 MG/0.75ML SOAJ  Pre-diabetes - Plan: Semaglutide-Weight Management (WEGOVY) 2.4 MG/0.75ML SOAJ  Malignant neoplasm of upper-outer quadrant of right breast in female, estrogen receptor positive ([place])  Osteoporosis of femur without pathological fracture  Weight at initiation was 168 pounds.  Patient is down to 155 pounds.  She had a successful 13 pound weight loss with this medication thus far.  We discussed that if she has any problems transitioning up to the 2.4 milligram weekly dose that we could consider de-escalating to the 1.7 mg weekly dose as this was recently approved as a therapeutic maintenance dose.  She will keep me posted as to how she responds going forward.  I look forward to checking fasting labs for her again at a future date to see what impact this medication has had on her from a metabolic standpoint  We discussed that I would probably proceed with a prosthetic for her breast concerns before I would undergo another surgery.  She is under the care of of Dr. Marla Roe, who is an excellent plastic surgeon and I think that she is in good hands either way.  Discussed with her consideration for possible once monthly dosing with bisphosphonate.  This may not necessarily alleviate the symptoms she is experiencing with the Fosamax but allowed to be less frequent given the once monthly dosing schedule for Actonel.  Once she is off the Nerlynx, I would be glad to try and arrange Prolia for her  No orders of the defined types were placed in this encounter.  No orders of the  defined types were placed in this encounter.    [name], DO [place] [phone]

## 2021-11-17 DIAGNOSIS — H53143 Visual discomfort, bilateral: Secondary | ICD-10-CM | POA: Diagnosis not present

## 2021-11-22 ENCOUNTER — Other Ambulatory Visit: Payer: BC Managed Care – PPO

## 2021-11-27 ENCOUNTER — Other Ambulatory Visit: Payer: Self-pay

## 2021-11-27 ENCOUNTER — Inpatient Hospital Stay: Payer: BC Managed Care – PPO | Attending: Hematology

## 2021-11-27 ENCOUNTER — Inpatient Hospital Stay: Payer: BC Managed Care – PPO | Admitting: Pharmacist

## 2021-11-27 ENCOUNTER — Inpatient Hospital Stay: Payer: BC Managed Care – PPO

## 2021-11-27 DIAGNOSIS — C50411 Malignant neoplasm of upper-outer quadrant of right female breast: Secondary | ICD-10-CM | POA: Diagnosis not present

## 2021-11-27 DIAGNOSIS — Z79899 Other long term (current) drug therapy: Secondary | ICD-10-CM | POA: Diagnosis not present

## 2021-11-27 DIAGNOSIS — Z923 Personal history of irradiation: Secondary | ICD-10-CM | POA: Insufficient documentation

## 2021-11-27 DIAGNOSIS — E876 Hypokalemia: Secondary | ICD-10-CM

## 2021-11-27 DIAGNOSIS — Z9221 Personal history of antineoplastic chemotherapy: Secondary | ICD-10-CM | POA: Insufficient documentation

## 2021-11-27 DIAGNOSIS — Z170 Estrogen receptor positive status [ER+]: Secondary | ICD-10-CM | POA: Insufficient documentation

## 2021-11-27 DIAGNOSIS — Z79811 Long term (current) use of aromatase inhibitors: Secondary | ICD-10-CM | POA: Insufficient documentation

## 2021-11-27 LAB — CMP (CANCER CENTER ONLY)
ALT: 11 U/L (ref 0–44)
AST: 14 U/L — ABNORMAL LOW (ref 15–41)
Albumin: 4.3 g/dL (ref 3.5–5.0)
Alkaline Phosphatase: 79 U/L (ref 38–126)
Anion gap: 7 (ref 5–15)
BUN: 13 mg/dL (ref 6–20)
CO2: 30 mmol/L (ref 22–32)
Calcium: 9.8 mg/dL (ref 8.9–10.3)
Chloride: 104 mmol/L (ref 98–111)
Creatinine: 0.86 mg/dL (ref 0.44–1.00)
GFR, Estimated: >60 mL/min (ref >60)
Glucose, Bld: 103 mg/dL — ABNORMAL HIGH (ref 70–99)
Potassium: 3.3 mmol/L — ABNORMAL LOW (ref 3.5–5.1)
Sodium: 141 mmol/L (ref 135–145)
Total Bilirubin: 0.6 mg/dL (ref 0.3–1.2)
Total Protein: 7 g/dL (ref 6.5–8.1)

## 2021-11-27 LAB — CBC WITH DIFFERENTIAL (CANCER CENTER ONLY)
Abs Immature Granulocytes: 0.01 10*3/uL (ref 0.00–0.07)
Basophils Absolute: 0 10*3/uL (ref 0.0–0.1)
Basophils Relative: 1 %
Eosinophils Absolute: 0.1 10*3/uL (ref 0.0–0.5)
Eosinophils Relative: 3 %
HCT: 39 % (ref 36.0–46.0)
Hemoglobin: 13.2 g/dL (ref 12.0–15.0)
Immature Granulocytes: 0 %
Lymphocytes Relative: 15 %
Lymphs Abs: 0.7 10*3/uL (ref 0.7–4.0)
MCH: 28.8 pg (ref 26.0–34.0)
MCHC: 33.8 g/dL (ref 30.0–36.0)
MCV: 85 fL (ref 80.0–100.0)
Monocytes Absolute: 0.4 10*3/uL (ref 0.1–1.0)
Monocytes Relative: 10 %
Neutro Abs: 3 10*3/uL (ref 1.7–7.7)
Neutrophils Relative %: 71 %
Platelet Count: 247 10*3/uL (ref 150–400)
RBC: 4.59 MIL/uL (ref 3.87–5.11)
RDW: 14.2 % (ref 11.5–15.5)
WBC Count: 4.3 10*3/uL (ref 4.0–10.5)
nRBC: 0 % (ref 0.0–0.2)

## 2021-11-27 MED ORDER — POTASSIUM CHLORIDE CRYS ER 20 MEQ PO TBCR
40.0000 meq | EXTENDED_RELEASE_TABLET | Freq: Once | ORAL | Status: AC
  Administered 2021-11-27: 40 meq via ORAL
  Filled 2021-11-27: qty 2

## 2021-11-27 NOTE — Patient Instructions (Signed)
Hypokalemia Hypokalemia means that the amount of potassium in the blood is lower than normal. Potassium is a mineral (electrolyte) that helps regulate the amount of fluid in the body. It also stimulates muscle tightening (contraction) and helps nerves work properly. Normally, most of the body's potassium is inside cells, and only a very small amount is in the blood. Because the amount in the blood is so small, minor changes to potassium levels in the blood can be life-threatening. What are the causes? This condition may be caused by: Antibiotic medicine. Diarrhea or vomiting. Taking too much of a medicine that helps you have a bowel movement (laxative) can cause diarrhea and lead to hypokalemia. Chronic kidney disease (CKD). Medicines that help the body get rid of excess fluid (diuretics). Eating disorders, such as anorexia or bulimia. Low magnesium levels in the body. Sweating a lot. What are the signs or symptoms? Symptoms of this condition include: Weakness. Constipation. Fatigue. Muscle cramps. Mental confusion. Skipped heartbeats or irregular heartbeat (palpitations). Tingling or numbness. How is this diagnosed? This condition is diagnosed with a blood test. How is this treated? This condition may be treated by: Taking potassium supplements. Adjusting the medicines that you take. Eating more foods that contain a lot of potassium. If your potassium level is very low, you may need to get potassium through an IV and be monitored in the hospital. Follow these instructions at home: Eating and drinking  Eat a healthy diet. A healthy diet includes fresh fruits and vegetables, whole grains, healthy fats, and lean proteins. If told, eat more foods that contain a lot of potassium. These include: Nuts, such as peanuts and pistachios. Seeds, such as sunflower seeds and pumpkin seeds. Peas, lentils, and lima beans. Whole grain and bran cereals and breads. Fresh fruits and vegetables,  such as apricots, avocado, bananas, cantaloupe, kiwi, oranges, tomatoes, asparagus, and potatoes. Juices, such as orange, tomato, and prune. Lean meats, including fish. Milk and milk products, such as yogurt. General instructions Take over-the-counter and prescription medicines only as told by your health care provider. This includes vitamins, natural food products, and supplements. Keep all follow-up visits. This is important. Contact a health care provider if: You have weakness that gets worse. You feel your heart pounding or racing. You vomit. You have diarrhea. You have diabetes and you have trouble keeping your blood sugar in your target range. Get help right away if: You have chest pain. You have shortness of breath. You have vomiting or diarrhea that lasts for more than 2 days. You faint. These symptoms may be an emergency. Get help right away. Call 911. Do not wait to see if the symptoms will go away. Do not drive yourself to the hospital. Summary Hypokalemia means that the amount of potassium in the blood is lower than normal. This condition is diagnosed with a blood test. Hypokalemia may be treated by taking potassium supplements, adjusting the medicines that you take, or eating more foods that are high in potassium. If your potassium level is very low, you may need to get potassium through an IV and be monitored in the hospital. This information is not intended to replace advice given to you by your health care provider. Make sure you discuss any questions you have with your health care provider. Document Revised: 12/01/2020 Document Reviewed: 12/01/2020 Elsevier Patient Education  2023 Elsevier Inc.  

## 2021-11-27 NOTE — Progress Notes (Signed)
[place]       Telephone: [phone]?Fax: [phone]   Oncology Clinical Pharmacist Practitioner Progress Note  Stephanie Brewer was contacted via in-person visit to discuss her chemotherapy regimen for neratinib which they receive under the care of Dr. Nicholas Lose.    Current treatment regimen and start date Neratinib (12/24/21) Anastrozole (07/13/20)   Interval History She continues on neratinib 3 tablets (120 mg) by mouth daily with food on days 1 to 28 of a 28-day cycle. This is being given  in combination with anastrozole.Therapy is planned to continue until  one year of extended adjuvant therapy, tentatively 12/24/22. Stephanie Brewer was seen today by clinical pharmacy as a follow-up to her neratinib management.  She was last seen by [name] on 08/24/21 and has had several discussions with clinical pharmacy, the last being 07/17/21.  She continues on neratinib 3 tablets (120 mg) daily with food.  She was also started on alendronate by [name] on 10/04/21 due to a DEXA scan that showed osteoporosis.  We did state today that she can continue her vitamin D/calcium supplement.  Response to Therapy Overall, Stephanie Brewer continues to do well on neratinib.  She does report today that about 3 PM yesterday she started having diarrhea and has been taking diphenoxylate/atropine every 6 hours and loperamide as needed.  Her potassium today is slightly low at 3.3 mmol/L and she will receive 40 mEq p.o in the infusion area today.  She will also come back with labs in 1 week for a toxicity assessment.  At that point if her potassium continues to be low, we may consider a potassium prescription.  Her potassium has been normal at her last several visits.  She also reports that she last saw her GYN physician recently Dr. Gari Crown on 09/20/21 and he had asked about potentially having a colonoscopy because of Stephanie Brewer history of GI symptoms.  We said that we would forward this information  to [name] to get his thoughts.  What may be reasonable is to see if Stephanie Brewer GI symptoms cease when she stops neratinib or if they continue afterwards.  Sandstrom reports having her last colonoscopy around 7 years ago.  We did discuss today with [name] that her neratinib in the extended adjuvant setting would be stopped around 12/23/21, however she feels strongly about continuing the neratinib through mid October because she did have some delays when she was not taking neratinib while on antibiotics.  We stated that she could stop the neratinib at her scheduled date but if she did decide to continue through mid October that would be reasonable as long as she continues to tolerate neratinib.  As above, we will see Stephanie Brewer with labs in 1 week for a toxicity check.  We will tentatively plan on seeing her again in mid October when she will likely be stopping neratinib at that time.  We will also forward our visit from today to [name] to see if he would like to obtain a colonoscopy prior to finishing neratinib or if he would like to see how she does off of neratinib before making that decision.  Stephanie Brewer knows to contact Dr. Geralyn Flash clinic with any questions or concerns in the interim, especially if her diarrhea worsens. Labs, vitals, treatment parameters, and manufacturer guidelines assessing toxicity were reviewed with [name] today. Based on these values, patient is in agreement to continue neratinib therapy at this time.  We did discuss with [name] that she could hold the neratinib today until her GI symptoms stop, however, she feels strongly about continuing neratinib despite her diarrhea at this time.  Allergies Allergies  Allergen Reactions   Hyoscyamine Anaphylaxis   Doxycycline     Racing heart.   Nexium [Esomeprazole]     Swollen throat    Vitals    11/15/2021    8:29 AM 08/24/2021    8:43 AM 08/15/2021    9:23 AM  Vitals with BMI  Height '5\' 4"'$  '5\' 4"'$  '5\' 4"'$    Weight 155 lbs 13 oz 168 lbs 13 oz 168 lbs 13 oz  BMI 26.73 40.81 44.81  Systolic 856 314 970  Diastolic 75 85 80  Pulse 90 78 79     Laboratory Data    Latest Ref Rng & Units 11/27/2021    9:33 AM 08/24/2021    8:32 AM 05/24/2021   10:04 AM  CBC EXTENDED  WBC 4.0 - 10.5 K/uL 4.3  3.9  4.1   RBC 3.87 - 5.11 MIL/uL 4.59  4.05  4.35   Hemoglobin 12.0 - 15.0 g/dL 13.2  11.7  12.4   HCT 36.0 - 46.0 % 39.0  35.5  37.5   Platelets 150 - 400 K/uL 247  228  228   NEUT# 1.7 - 7.7 K/uL 3.0  2.5  2.7   Lymph# 0.7 - 4.0 K/uL 0.7  0.8  0.8        Latest Ref Rng & Units 11/27/2021    9:33 AM 08/24/2021    8:32 AM 08/15/2021    9:59 AM  CMP  Glucose 70 - 99 mg/dL 103  99  106   BUN 6 - 20 mg/dL '13  12  12   '$ Creatinine 0.44 - 1.00 mg/dL 0.86  0.79  0.72   Sodium 135 - 145 mmol/L 141  141  142   Potassium 3.5 - 5.1 mmol/L 3.3  3.8  4.2   Chloride 98 - 111 mmol/L 104  106  104   CO2 22 - 32 mmol/L 30  30  36   Calcium 8.9 - 10.3 mg/dL 9.8  9.0  9.5   Total Protein 6.5 - 8.1 g/dL 7.0  6.5  6.8   Total Bilirubin 0.3 - 1.2 mg/dL 0.6  0.6  0.4   Alkaline Phos 38 - 126 U/L 79  98  122   AST 15 - 41 U/L '14  11  14   '$ ALT 0 - 44 U/L '11  7  12     '$ Lab Results  Component Value Date   MG 1.6 (L) 12/04/2019   MG 1.7 11/13/2019   MG 1.9 10/26/2019     Adverse Effects Assessment Diarrhea: Was doing well until yesterday at 3 PM.  This may be neratinib related or an infection process.  Her potassium is slightly low at 3.3 mmol/L and we will give 40 mEq p.o. today and recheck her labs next week.  She continues to take diphenoxylate/atropine every 6 hours as needed and loperamide as needed.  She will contact the clinic should her symptoms worsen and we have explained that she should hold neratinib.  However, patient feels strongly about continuing neratinib at this time.  Adherence Assessment [name] reports missing 0 doses over the past 4 weeks.   Reason for missed dose: N/A Patient was  re-educated on importance of adherence.   Access Assessment [name] is currently receiving her  neratinib through [place] concerns: None  Medication Reconciliation The patient's medication list was reviewed today with the patient?  Yes New medications or herbal supplements have recently been started?  Yes, semaglutide.  This is on her medication list Any medications have been discontinued?  No, as above, she will continue calcium/vitamin D supplement while on alendronate. The medication list was updated and reconciled based on the patient's most recent medication list in the electronic medical record (EMR) including herbal products and OTC medications.   Medications Current Outpatient Medications  Medication Sig Dispense Refill   alendronate (FOSAMAX) 70 MG tablet Take 1 tablet (70 mg total) by mouth once a week. Take with a full glass of water on an empty stomach. 12 tablet 3   anastrozole (ARIMIDEX) 1 MG tablet TAKE ONE TABLET ONCE DAILY 90 tablet 3   calcium-vitamin D (OSCAL WITH D) 500-200 MG-UNIT tablet Take 1 tablet by mouth in the morning and at bedtime. (Patient not taking: Reported on 11/15/2021)     diphenoxylate-atropine (LOMOTIL) 2.5-0.025 MG tablet TAKE TWO TABLETS FOUR TIMES DAILY AS NEEDED FOR DIARRHEA 30 tablet 2   glucose blood test strip UAD to check sugar. R73.03 100 each 12   Lancets Thin MISC UAD to check sugar. R73.03 100 each 12   loperamide (IMODIUM) 2 MG capsule Take 2 mg by mouth as needed for diarrhea or loose stools. Take 2 tabs (4 mg) with first loose stool, then 1 tab (2 mg) with each additional loose stool as needed. Do not take more than 8 tabs (16 mg) in a 24 hour period.     Neratinib Maleate (NERLYNX) 40 MG tablet Take 3 tablets (120 mg total) by mouth daily. Take with food. 84 tablet 1   rivaroxaban (XARELTO) 20 MG TABS tablet Take 1 tablet (20 mg total) by mouth daily with supper. 30 tablet 12   Semaglutide-Weight  Management (WEGOVY) 1 MG/0.5ML SOAJ Inject 1 mg into the skin every 7 (seven) days. Month #3 2 mL 0   Semaglutide-Weight Management (WEGOVY) 1.7 MG/0.75ML SOAJ Inject 1.7 mg into the skin every 7 (seven) days. Month#4 3 mL 0   Semaglutide-Weight Management (WEGOVY) 2.4 MG/0.75ML SOAJ Inject 2.4 mg into the skin every 7 (seven) days. 9 mL 3   No current facility-administered medications for this visit.   Facility-Administered Medications Ordered in Other Visits  Medication Dose Route Frequency Provider Last Rate Last Admin   potassium chloride SA (KLOR-CON M) CR tablet 40 mEq  40 mEq Oral Once Nicholas Lose, MD        Drug-Drug Interactions (DDIs) DDIs were evaluated?  Yes Significant DDIs?  No, [name] aware that patient is taking diphenoxylate/atropine as needed and loperamide as needed for diarrhea due to neratinib The patient was instructed to speak with their health care provider and/or the oral chemotherapy pharmacist before starting any new drug, including prescription or over the counter, natural / herbal products, or vitamins.  Supportive Care Discussed proper administration of diphenoxylate/atropine and loperamide  Dosing Assessment Hepatic adjustments needed?  No Renal adjustments needed?  No Toxicity adjustments needed?  No, Ms. Sherfield is on the lowest dose recommended for neratinib by the manufacture The current dosing regimen is appropriate to continue at this time.  Ms. Popoca knows to stop neratinib if diarrhea worsens  Follow-Up Plan Continue neratinib 3 tablets by mouth daily with food.  Ms. Lalley is to hold neratinib if diarrhea worsens.  Was scheduled to finish neratinib on 12/23/21 but Ms.  Paulson would prefer to continue neratinib through the middle of October since she had some delays when on antibiotics Labs, pharmacy clinic visit, on 12/06/21 for neratinib toxicity check.  Will add magnesium level at this visit.  Ms. Widrig knows to contact Dr. Geralyn Flash clinic  should her symptoms worsen in the interim.  Ms. Fodge felt strongly that she would like to continue neratinib at this time despite her recent episodes of diarrhea. Labs, [name] visit, on 08/24/22.  We will forward today's visit note to see if [name] would like to get colonoscopy prior to this visit per Stephanie Brewer GYN Dr. Evie Lacks inquiry prior to stopping neratinib or if [name] would like to wait to see if GI symptoms resolve after neratinib finishes  [name] participated in the discussion, expressed understanding, and voiced agreement with the above plan. All questions were answered to her satisfaction. The patient was advised to contact the clinic at [phone] with any questions or concerns prior to her return visit.   I spent 30 minutes assessing and educating the patient.  [name], RPH-CPP, 11/27/2021  10:54 AM   **Disclaimer: This note was dictated with voice recognition software. Similar sounding words can inadvertently be transcribed and this note may contain transcription errors which may not have been corrected upon publication of note.**

## 2021-11-30 DIAGNOSIS — C50911 Malignant neoplasm of unspecified site of right female breast: Secondary | ICD-10-CM | POA: Diagnosis not present

## 2021-11-30 DIAGNOSIS — Z9012 Acquired absence of left breast and nipple: Secondary | ICD-10-CM | POA: Diagnosis not present

## 2021-12-01 ENCOUNTER — Telehealth: Payer: Self-pay | Admitting: *Deleted

## 2021-12-01 ENCOUNTER — Telehealth: Payer: BC Managed Care – PPO | Admitting: Plastic Surgery

## 2021-12-01 NOTE — Telephone Encounter (Signed)
Received on (11/30/21) via of fax DME Standard Written Order from [facility] [place].  Requesting signature,date,and return.  Given to provider to sign,date,and return.     DME Standard Written Order signed,dated,and faxed back to [facility] [place].  Confirmation received and copy scanned into the chart.//AB/CMA

## 2021-12-06 ENCOUNTER — Other Ambulatory Visit: Payer: Self-pay

## 2021-12-06 ENCOUNTER — Inpatient Hospital Stay: Payer: BC Managed Care – PPO | Attending: Hematology

## 2021-12-06 ENCOUNTER — Inpatient Hospital Stay: Payer: BC Managed Care – PPO | Admitting: Pharmacist

## 2021-12-06 VITALS — BP 116/77 | HR 66 | Temp 97.3°F | Resp 12 | Ht 64.0 in | Wt 150.0 lb

## 2021-12-06 DIAGNOSIS — R110 Nausea: Secondary | ICD-10-CM | POA: Insufficient documentation

## 2021-12-06 DIAGNOSIS — C50911 Malignant neoplasm of unspecified site of right female breast: Secondary | ICD-10-CM | POA: Diagnosis not present

## 2021-12-06 DIAGNOSIS — N390 Urinary tract infection, site not specified: Secondary | ICD-10-CM | POA: Insufficient documentation

## 2021-12-06 DIAGNOSIS — Z170 Estrogen receptor positive status [ER+]: Secondary | ICD-10-CM | POA: Diagnosis not present

## 2021-12-06 DIAGNOSIS — R1032 Left lower quadrant pain: Secondary | ICD-10-CM | POA: Diagnosis not present

## 2021-12-06 DIAGNOSIS — E860 Dehydration: Secondary | ICD-10-CM | POA: Diagnosis not present

## 2021-12-06 DIAGNOSIS — Z9012 Acquired absence of left breast and nipple: Secondary | ICD-10-CM | POA: Diagnosis not present

## 2021-12-06 DIAGNOSIS — C50411 Malignant neoplasm of upper-outer quadrant of right female breast: Secondary | ICD-10-CM | POA: Diagnosis not present

## 2021-12-06 DIAGNOSIS — Z79811 Long term (current) use of aromatase inhibitors: Secondary | ICD-10-CM | POA: Insufficient documentation

## 2021-12-06 LAB — CBC WITH DIFFERENTIAL (CANCER CENTER ONLY)
Abs Immature Granulocytes: 0.01 10*3/uL (ref 0.00–0.07)
Basophils Absolute: 0 10*3/uL (ref 0.0–0.1)
Basophils Relative: 1 %
Eosinophils Absolute: 0.2 10*3/uL (ref 0.0–0.5)
Eosinophils Relative: 5 %
HCT: 38.1 % (ref 36.0–46.0)
Hemoglobin: 12.7 g/dL (ref 12.0–15.0)
Immature Granulocytes: 0 %
Lymphocytes Relative: 20 %
Lymphs Abs: 0.9 10*3/uL (ref 0.7–4.0)
MCH: 28.8 pg (ref 26.0–34.0)
MCHC: 33.3 g/dL (ref 30.0–36.0)
MCV: 86.4 fL (ref 80.0–100.0)
Monocytes Absolute: 0.4 10*3/uL (ref 0.1–1.0)
Monocytes Relative: 10 %
Neutro Abs: 2.8 10*3/uL (ref 1.7–7.7)
Neutrophils Relative %: 64 %
Platelet Count: 226 10*3/uL (ref 150–400)
RBC: 4.41 MIL/uL (ref 3.87–5.11)
RDW: 14.6 % (ref 11.5–15.5)
WBC Count: 4.4 10*3/uL (ref 4.0–10.5)
nRBC: 0 % (ref 0.0–0.2)

## 2021-12-06 LAB — CMP (CANCER CENTER ONLY)
ALT: 11 U/L (ref 0–44)
AST: 16 U/L (ref 15–41)
Albumin: 4.1 g/dL (ref 3.5–5.0)
Alkaline Phosphatase: 74 U/L (ref 38–126)
Anion gap: 6 (ref 5–15)
BUN: 11 mg/dL (ref 6–20)
CO2: 28 mmol/L (ref 22–32)
Calcium: 9.1 mg/dL (ref 8.9–10.3)
Chloride: 105 mmol/L (ref 98–111)
Creatinine: 0.91 mg/dL (ref 0.44–1.00)
GFR, Estimated: >60 mL/min (ref >60)
Glucose, Bld: 96 mg/dL (ref 70–99)
Potassium: 3.6 mmol/L (ref 3.5–5.1)
Sodium: 139 mmol/L (ref 135–145)
Total Bilirubin: 0.7 mg/dL (ref 0.3–1.2)
Total Protein: 6.7 g/dL (ref 6.5–8.1)

## 2021-12-06 LAB — MAGNESIUM: Magnesium: 1.9 mg/dL (ref 1.7–2.4)

## 2021-12-06 NOTE — Progress Notes (Signed)
[place]       Telephone: [phone]?Fax: [phone]   Oncology Clinical Pharmacist Practitioner Progress Note  Stephanie Brewer was contacted via in-person visit to discuss her chemotherapy regimen for neratinib which they receive under the care of Dr. Nicholas Lose.    Current treatment regimen and start date Neratinib (12/24/21) Anastrozole (07/13/20)   Interval History She continues on neratinib 3 tablets (120 mg) by mouth daily with food on days 1 to 28 of a 28-day cycle. This is being given  in combination with anastrozole.Therapy is planned to continue until  one year of extended adjuvant therapy.  She was last seen by clinical pharmacy last Monday and at that time her potassium was slightly low and she was having some diarrhea.  She was given oral potassium in clinic and we rechecked labs today with magnesium.  Response to Therapy Today Stephanie Brewer is doing much better.  She did have some diarrhea yesterday which she took diphenoxylate/atropine.  Unfortunately, she had run out of loperamide but will be picking up more today.  Her potassium has normalized today and her magnesium is WNL.  She was started on semaglutide [facility]) around the third week of June.  All this tends to cause constipation, it may be also contributing to her diarrhea.  She also continues on alendronate for osteoporosis.  She states that she is having some belching since starting this agent.  She has been taking over-the-counter Tums with some relief.  She may consider denosumab 60 mg (Prolia) which is every 6 months if she continues to have tolerability issues with alendronate.  She will have labs and see clinical pharmacy on 01/15/22 and at that time she will have likely finished her extended adjuvant neratinib therapy.  She will then see [name] with labs on 08/24/2022.  She knows that she can reach out to Dr. Geralyn Flash clinic with any questions or concerns in the interim and clinical pharmacy  will continue to be available as needed when she is finished with her neratinib therapy. Labs, vitals, treatment parameters, and manufacturer guidelines assessing toxicity were reviewed with Stephanie Brewer today. Based on these values, patient is in agreement to continue neratinib therapy at this time.  Allergies Allergies  Allergen Reactions   Hyoscyamine Anaphylaxis   Doxycycline     Racing heart.   Nexium [Esomeprazole]     Swollen throat    Vitals    12/06/2021    8:57 AM 11/15/2021    8:29 AM 08/24/2021    8:43 AM  Vitals with BMI  Height '5\' 4"'$  '5\' 4"'$  '5\' 4"'$   Weight 150 lbs 155 lbs 13 oz 168 lbs 13 oz  BMI 25.73 75.64 33.29  Systolic 518 841 660  Diastolic 77 75 85  Pulse 66 90 78   Temp Readings from Last 3 Encounters:  12/06/21 (!) 97.3 F (36.3 C) (Temporal)  11/15/21 (!) 97.4 F (36.3 C)  08/24/21 97.7 F (36.5 C) (Temporal)     Laboratory Data    Latest Ref Rng & Units 12/06/2021    8:31 AM 11/27/2021    9:33 AM 08/24/2021    8:32 AM  CBC EXTENDED  WBC 4.0 - 10.5 K/uL 4.4  4.3  3.9   RBC 3.87 - 5.11 MIL/uL 4.41  4.59  4.05   Hemoglobin 12.0 - 15.0 g/dL 12.7  13.2  11.7   HCT 36.0 - 46.0 % 38.1  39.0  35.5   Platelets 150 - 400  K/uL 226  247  228   NEUT# 1.7 - 7.7 K/uL 2.8  3.0  2.5   Lymph# 0.7 - 4.0 K/uL 0.9  0.7  0.8        Latest Ref Rng & Units 12/06/2021    8:31 AM 11/27/2021    9:33 AM 08/24/2021    8:32 AM  CMP  Glucose 70 - 99 mg/dL 96  103  99   BUN 6 - 20 mg/dL '11  13  12   '$ Creatinine 0.44 - 1.00 mg/dL 0.91  0.86  0.79   Sodium 135 - 145 mmol/L 139  141  141   Potassium 3.5 - 5.1 mmol/L 3.6  3.3  3.8   Chloride 98 - 111 mmol/L 105  104  106   CO2 22 - 32 mmol/L '28  30  30   '$ Calcium 8.9 - 10.3 mg/dL 9.1  9.8  9.0   Total Protein 6.5 - 8.1 g/dL 6.7  7.0  6.5   Total Bilirubin 0.3 - 1.2 mg/dL 0.7  0.6  0.6   Alkaline Phos 38 - 126 U/L 74  79  98   AST 15 - 41 U/L '16  14  11   '$ ALT 0 - 44 U/L '11  11  7     '$ Lab Results  Component Value Date   MG  1.9 12/06/2021   MG 1.6 (L) 12/04/2019   MG 1.7 11/13/2019     Adverse Effects Assessment Low potassium: Normalized Loose stools: Continue to wax and wane.  She had several bouts of diarrhea yesterday that she took diphenoxylate/atropine.  She did not have any loperamide on hand but will pick some up today at her local pharmacy.  She knows that if the diarrhea does get on tolerable, she can hold the neratinib like she did this past week until her symptoms have improved  Adherence Assessment Stephanie Brewer reports missing 2 doses over the past 1 weeks.   Reason for missed dose: Held for 2 days, last Tuesday and Wednesday, due to diarrhea.  Restarted on Thursday Patient was re-educated on importance of adherence.   Access Assessment Stephanie Brewer is currently receiving her neratinib through [facility] concerns: None  Medication Reconciliation The patient's medication list was reviewed today with the patient?  Yes New medications or herbal supplements have recently been started?  No Any medications have been discontinued?  No The medication list was updated and reconciled based on the patient's most recent medication list in the electronic medical record (EMR) including herbal products and OTC medications.   Medications Current Outpatient Medications  Medication Sig Dispense Refill   alendronate (FOSAMAX) 70 MG tablet Take 1 tablet (70 mg total) by mouth once a week. Take with a full glass of water on an empty stomach. 12 tablet 3   anastrozole (ARIMIDEX) 1 MG tablet TAKE ONE TABLET ONCE DAILY 90 tablet 3   calcium-vitamin D (OSCAL WITH D) 500-200 MG-UNIT tablet Take 1 tablet by mouth in the morning and at bedtime. (Patient not taking: Reported on 11/15/2021)     diphenoxylate-atropine (LOMOTIL) 2.5-0.025 MG tablet TAKE TWO TABLETS FOUR TIMES DAILY AS NEEDED FOR DIARRHEA 30 tablet 2   glucose blood test strip UAD to check sugar. R73.03 100 each 12   Lancets Thin  MISC UAD to check sugar. R73.03 100 each 12   loperamide (IMODIUM) 2 MG capsule Take 2 mg by mouth as needed for diarrhea or loose stools. Take 2  tabs (4 mg) with first loose stool, then 1 tab (2 mg) with each additional loose stool as needed. Do not take more than 8 tabs (16 mg) in a 24 hour period.     Neratinib Maleate (NERLYNX) 40 MG tablet Take 3 tablets (120 mg total) by mouth daily. Take with food. 84 tablet 1   rivaroxaban (XARELTO) 20 MG TABS tablet Take 1 tablet (20 mg total) by mouth daily with supper. 30 tablet 12   Semaglutide-Weight Management (WEGOVY) 1 MG/0.5ML SOAJ Inject 1 mg into the skin every 7 (seven) days. Month #3 2 mL 0   Semaglutide-Weight Management (WEGOVY) 1.7 MG/0.75ML SOAJ Inject 1.7 mg into the skin every 7 (seven) days. Month#4 3 mL 0   Semaglutide-Weight Management (WEGOVY) 2.4 MG/0.75ML SOAJ Inject 2.4 mg into the skin every 7 (seven) days. 9 mL 3   No current facility-administered medications for this visit.    Drug-Drug Interactions (DDIs) DDIs were evaluated?  Yes Significant DDIs?  She knows to separate out Tums with neratinib by at least 3 hours per manufacturing guidelines The patient was instructed to speak with their health care provider and/or the oral chemotherapy pharmacist before starting any new drug, including prescription or over the counter, natural / herbal products, or vitamins.  Supportive Care Continue to use diphenoxylate/atropine and loperamide as needed for loose stools  Dosing Assessment Hepatic adjustments needed?  No Renal adjustments needed?  No Toxicity adjustments needed?  No The current dosing regimen is appropriate to continue at this time.  Follow-Up Plan Continue neratinib 3 tablets (120 mg) by mouth daily with food.  We will be finishing up extended adjuvant therapy very soon.  Stephanie Brewer preferred to finish up her remaining medication of neratinib since she did have some delays while on antibiotics earlier in the  year. Continue anastrozole 1 mg by mouth daily Continue alendronate 70 mg by mouth weekly.  If she continues to have indigestion with this agent, she may consider denosumab 60 mg subcutaneously every 6 months. Labs, pharmacy clinic visit, on 01/15/22.  After this visit she will have finished neratinib therapy and can continue to see clinical pharmacy as needed Labs, [name] visit, on 08/24/2022  Stephanie Brewer participated in the discussion, expressed understanding, and voiced agreement with the above plan. All questions were answered to her satisfaction. The patient was advised to contact the clinic at [phone] with any questions or concerns prior to her return visit.   I spent 30 minutes assessing and educating the patient.  [name], RPH-CPP, 12/06/2021  9:21 AM   **Disclaimer: This note was dictated with voice recognition software. Similar sounding words can inadvertently be transcribed and this note may contain transcription errors which may not have been corrected upon publication of note.**

## 2021-12-11 ENCOUNTER — Ambulatory Visit: Payer: BC Managed Care – PPO | Attending: Hematology and Oncology

## 2021-12-11 VITALS — Wt 148.0 lb

## 2021-12-11 DIAGNOSIS — Z483 Aftercare following surgery for neoplasm: Secondary | ICD-10-CM | POA: Insufficient documentation

## 2021-12-11 NOTE — Therapy (Signed)
OUTPATIENT PHYSICAL THERAPY SOZO SCREENING NOTE   Patient Name: Stephanie Brewer MRN: [id number] DOB:[birth year], 56 y.o., female Today's Date: 12/11/2021  PCP: [name], DO REFERRING PROVIDER: Nicholas Lose, MD   PT End of Session - 12/11/21 [phone]     Visit Number 2   # unchanged due to screen only   PT Start Time 0901    PT Stop Time 0905    PT Time Calculation (min) 4 min    Activity Tolerance Patient tolerated treatment well    Behavior During Therapy WFL for tasks assessed/performed             Past Medical History:  Diagnosis Date   Cancer ([place])    DVT (deep venous thrombosis) ([place])    left leg knee and ankle    GERD (gastroesophageal reflux disease)    Past Surgical History:  Procedure Laterality Date   BREAST IMPLANT REMOVAL Bilateral 01/14/2020   Procedure: REMOVAL BREAST IMPLANTS;  Surgeon: Cindra Presume, MD;  Location: [place];  Service: Plastics;  Laterality: Bilateral;   BREAST LUMPECTOMY WITH RADIOACTIVE SEED AND SENTINEL LYMPH NODE BIOPSY Right 01/14/2020   Procedure: Right breast seed localized lumpectomy with sentinel lymph node biopsy;  Surgeon: Rolm Bookbinder, MD;  Location: [place];  Service: General;  Laterality: Right;   BREAST SURGERY     Augmentation 2003   HERNIA REPAIR  2002   LIPOSUCTION WITH LIPOFILLING Bilateral 11/29/2020   Procedure: LIPOSUCTION WITH LIPOFILLING FROM ABDOMEN TO BILATERAL BREASTS;  Surgeon: Cindra Presume, MD;  Location: [place];  Service: Plastics;  Laterality: Bilateral;   MASTOPEXY Left 11/29/2020   Procedure: LEFT MASTOPEXY;  Surgeon: Cindra Presume, MD;  Location: [place];  Service: Plastics;  Laterality: Left;   PORTACATH PLACEMENT N/A 09/01/2019   Procedure: INSERTION PORT-A-CATH WITH ULTRASOUND GUIDANCE;  Surgeon: Rolm Bookbinder, MD;  Location: [place];  Service: General;  Laterality: N/A;   TRIGGER FINGER  RELEASE Left 05/09/2020   Procedure: RELEASE TRIGGER FINGER/A-1 PULLEY LEFT LONG;  Surgeon: Leanora Cover, MD;  Location: [place];  Service: Orthopedics;  Laterality: Left;   Patient Active Problem List   Diagnosis Date Noted   Osteoporosis of femur without pathological fracture 11/15/2021   Overweight (BMI 25.0-29.9) 08/15/2021   Port-A-Cath in place 09/09/2019   Malignant neoplasm of upper-outer quadrant of right breast in female, estrogen receptor positive ([place]) 08/20/2019   Breast cancer metastasized to axillary lymph node, right ([place]) 08/18/2019   Trigger middle finger of left hand 02/10/2019   Radicular leg pain 02/04/2018   Numbness and tingling 02/04/2018   Foot pain, left 02/04/2018   Displacement of breast implant 12/24/2017   H/O bilateral breast implants 12/24/2017   GERD (gastroesophageal reflux disease) 12/06/2015   Gluten intolerance 12/06/2015    REFERRING DIAG: right breast cancer at risk for lymphedema  THERAPY DIAG:  Aftercare following surgery for neoplasm  PERTINENT HISTORY: R breast cancer, grade 2 invasive ductal carcinoma, ER +, PR-, HER2+, beginning chemo next week 09/02/19, 01/15/20- R lumpectomy and SLNB (4 nodes all negative), will begin radiation 02/15/20,  hx of LLE DVT following foot surgery for bunion, acute DVT in LUE and in LLE diagnosed on 12/16/20   PRECAUTIONS: right UE Lymphedema risk, None  SUBJECTIVE: Pt returns for her 3 month L-Dex screen.   PAIN:  Are you having pain? No  SOZO SCREENING: Patient was assessed today using the SOZO machine to  determine the lymphedema index score. This was compared to her baseline score. It was determined that she is within the recommended range when compared to her baseline and no further action is needed at this time. She will continue SOZO screenings. These are done every 3 months for 2 years post operatively followed by every 6 months for 2 years, and then annually.    [name], PTA 12/11/2021, 9:05 AM

## 2021-12-18 DIAGNOSIS — Z9012 Acquired absence of left breast and nipple: Secondary | ICD-10-CM | POA: Diagnosis not present

## 2021-12-18 DIAGNOSIS — C50911 Malignant neoplasm of unspecified site of right female breast: Secondary | ICD-10-CM | POA: Diagnosis not present

## 2021-12-25 ENCOUNTER — Other Ambulatory Visit: Payer: BC Managed Care – PPO

## 2021-12-25 ENCOUNTER — Telehealth: Payer: Self-pay

## 2021-12-25 ENCOUNTER — Other Ambulatory Visit: Payer: Self-pay

## 2021-12-25 ENCOUNTER — Inpatient Hospital Stay: Payer: BC Managed Care – PPO

## 2021-12-25 ENCOUNTER — Telehealth: Payer: Self-pay | Admitting: Pharmacist

## 2021-12-25 ENCOUNTER — Inpatient Hospital Stay (HOSPITAL_BASED_OUTPATIENT_CLINIC_OR_DEPARTMENT_OTHER): Payer: BC Managed Care – PPO | Admitting: Physician Assistant

## 2021-12-25 VITALS — BP 127/94 | HR 84 | Temp 98.4°F | Resp 18 | Wt 142.0 lb

## 2021-12-25 DIAGNOSIS — R1032 Left lower quadrant pain: Secondary | ICD-10-CM | POA: Diagnosis not present

## 2021-12-25 DIAGNOSIS — E860 Dehydration: Secondary | ICD-10-CM | POA: Diagnosis not present

## 2021-12-25 DIAGNOSIS — C50411 Malignant neoplasm of upper-outer quadrant of right female breast: Secondary | ICD-10-CM

## 2021-12-25 DIAGNOSIS — R110 Nausea: Secondary | ICD-10-CM | POA: Diagnosis not present

## 2021-12-25 DIAGNOSIS — Z170 Estrogen receptor positive status [ER+]: Secondary | ICD-10-CM

## 2021-12-25 DIAGNOSIS — R319 Hematuria, unspecified: Secondary | ICD-10-CM

## 2021-12-25 DIAGNOSIS — R509 Fever, unspecified: Secondary | ICD-10-CM

## 2021-12-25 DIAGNOSIS — Z79811 Long term (current) use of aromatase inhibitors: Secondary | ICD-10-CM | POA: Diagnosis not present

## 2021-12-25 DIAGNOSIS — N390 Urinary tract infection, site not specified: Secondary | ICD-10-CM

## 2021-12-25 LAB — URINALYSIS, COMPLETE (UACMP) WITH MICROSCOPIC
Bilirubin Urine: NEGATIVE
Glucose, UA: NEGATIVE mg/dL
Ketones, ur: 20 mg/dL — AB
Nitrite: NEGATIVE
Protein, ur: 100 mg/dL — AB
Specific Gravity, Urine: 1.03 (ref 1.005–1.030)
pH: 5 (ref 5.0–8.0)

## 2021-12-25 LAB — CBC WITH DIFFERENTIAL (CANCER CENTER ONLY)
Abs Immature Granulocytes: 0.02 10*3/uL (ref 0.00–0.07)
Basophils Absolute: 0.1 10*3/uL (ref 0.0–0.1)
Basophils Relative: 1 %
Eosinophils Absolute: 0.1 10*3/uL (ref 0.0–0.5)
Eosinophils Relative: 2 %
HCT: 37 % (ref 36.0–46.0)
Hemoglobin: 12.4 g/dL (ref 12.0–15.0)
Immature Granulocytes: 0 %
Lymphocytes Relative: 18 %
Lymphs Abs: 0.8 10*3/uL (ref 0.7–4.0)
MCH: 28.6 pg (ref 26.0–34.0)
MCHC: 33.5 g/dL (ref 30.0–36.0)
MCV: 85.3 fL (ref 80.0–100.0)
Monocytes Absolute: 0.5 10*3/uL (ref 0.1–1.0)
Monocytes Relative: 10 %
Neutro Abs: 3.2 10*3/uL (ref 1.7–7.7)
Neutrophils Relative %: 69 %
Platelet Count: 318 10*3/uL (ref 150–400)
RBC: 4.34 MIL/uL (ref 3.87–5.11)
RDW: 14.3 % (ref 11.5–15.5)
WBC Count: 4.6 10*3/uL (ref 4.0–10.5)
nRBC: 0 % (ref 0.0–0.2)

## 2021-12-25 LAB — CMP (CANCER CENTER ONLY)
ALT: 7 U/L (ref 0–44)
AST: 10 U/L — ABNORMAL LOW (ref 15–41)
Albumin: 3.9 g/dL (ref 3.5–5.0)
Alkaline Phosphatase: 72 U/L (ref 38–126)
Anion gap: 9 (ref 5–15)
BUN: 16 mg/dL (ref 6–20)
CO2: 30 mmol/L (ref 22–32)
Calcium: 9.3 mg/dL (ref 8.9–10.3)
Chloride: 98 mmol/L (ref 98–111)
Creatinine: 0.77 mg/dL (ref 0.44–1.00)
GFR, Estimated: >60 mL/min (ref >60)
Glucose, Bld: 100 mg/dL — ABNORMAL HIGH (ref 70–99)
Potassium: 3.5 mmol/L (ref 3.5–5.1)
Sodium: 137 mmol/L (ref 135–145)
Total Bilirubin: 0.5 mg/dL (ref 0.3–1.2)
Total Protein: 7.4 g/dL (ref 6.5–8.1)

## 2021-12-25 MED ORDER — ONDANSETRON HCL 4 MG/2ML IJ SOLN
4.0000 mg | Freq: Once | INTRAMUSCULAR | Status: AC
  Administered 2021-12-25: 4 mg via INTRAVENOUS
  Filled 2021-12-25: qty 2

## 2021-12-25 MED ORDER — CIPROFLOXACIN HCL 500 MG PO TABS: 500.0000 mg | ORAL_TABLET | Freq: Two times a day (BID) | ORAL | 0 refills | Status: AC

## 2021-12-25 MED ORDER — SODIUM CHLORIDE 0.9 % IV SOLN: Freq: Once | INTRAVENOUS | Status: AC

## 2021-12-25 NOTE — Progress Notes (Signed)
Symptom Management Consult note [place] [facility]    Patient Care Team: [name], DO as PCP - General (Family Medicine) Mauro Kaufmann, RN as Oncology Nurse Navigator Rockwell Germany, RN as Oncology Nurse Navigator Gwyndolyn Kaufman, RN (Inactive) as Registered Nurse Gwyndolyn Kaufman, RN (Inactive) as Registered Nurse Nicholas Lose, MD as Medical Oncologist (Hematology and Oncology) [name], RPH-CPP as Pharmacist (Hematology and Oncology)    Name of the patient: Stephanie Brewer  [id number]  April 24, 1965   Date of visit: 12/25/2021   Chief Complaint/Reason for visit: fever, abdominal pain   Current Therapy: Neratinib, anastrozole, alendronate     ASSESSMENT & PLAN: Patient is a 56 y.o. female  with oncologic history of ER + right breast cancer followed by [name].  I have viewed most recent oncology note and lab work.    #) ER+ right breast cancer -Currently on adjuvant therapy. Patient has appointment with Dr. Camille Bal who advised her to stop neratinib until symptoms below improve.  -Patient scheduled for pharmacy clinic appoint with Dr. Camille Bal 01/15/22 - Next appointment with oncologist is 08/23/22.   #) LLQ Abdominal pain -Patient is afebrile, non toxic appearing.  -TTP LLQ on exam with voluntary guarding. -Patient's symptoms have been ongoing x 1 year, now worsening x 1 week. With stable vitals and unremarkable CBC and CMP she is stable for outpatient CT AP. Imaging ordered and will be scheduled once PA obtained. -If imaging is unremarkable advised patient to follow up with gyn for further evaluation.   #)Urinary frequency -UA is suggestive of UTI. She has had multiple in the last year. Urine culture sent. Will treat with cipro. -UA also showing hemoglobinuria and 21-50 RBC. Patient reports blood in urine with UTIs in the past. -She has history of kidney stones and is confident this does not feel similar. No CVA tenderness on  exam. -After resolution of UTI patient advised to follow up with pcp for repeat UA to make sure hemoglobinuria also resolved and does not need additional work up.Patient agrees with plan.   #) Dehydration and nausea -Patient with decreased fluid intake during the last week because of abdominal pain.  -Clinically patient appears dehydrated and UA shows 20 ketones.  Patient given IV fluids and Zofran here in clinic.  Strict ED precautions discussed should symptoms worsen.   Heme/Onc History: Oncology History  Malignant neoplasm of upper-outer quadrant of right breast in female, estrogen receptor positive ([place])  07/28/2019 Initial Diagnosis   Screening mammogram detected right breast mass 11:30 position subareolar 1.3 cm, indeterminate 5 mm mass: Biopsy fibroadenoma, right axillary lymph node present, biopsy of the lymph node and the mass revealed grade 2 IDC ER 10%, PR 0%, Ki-67 45%, HER-2 +3+ by [facility]   08/20/2019 Cancer Staging   Staging form: Breast, AJCC 8th Edition - Clinical stage from 08/20/2019: Stage IIA (cT2, cN1, cM0, G2, ER+, PR-, HER2+) - Signed by Eppie Gibson, MD on 01/26/2020   09/02/2019 - 12/18/2019 Chemotherapy   The patient had dexamethasone (DECADRON) 4 MG tablet, 4 mg (100 % of original dose 4 mg), Oral, Daily, 1 of 1 cycle, Start date: 08/20/2019, End date: 01/06/2020 Dose modification: 4 mg (original dose 4 mg, Cycle 0) palonosetron (ALOXI) injection 0.25 mg, 0.25 mg, Intravenous,  Once, 6 of 6 cycles Administration: 0.25 mg (09/02/2019), 0.25 mg (09/23/2019), 0.25 mg (11/25/2019), 0.25 mg (12/16/2019), 0.25 mg (10/14/2019), 0.25 mg (11/04/2019) pegfilgrastim-jmdb (FULPHILA) injection 6 mg, 6 mg, Subcutaneous,  Once, 6 of 6 cycles Administration: 6 mg (09/04/2019), 6 mg (09/25/2019), 6 mg (11/27/2019), 6 mg (12/18/2019), 6 mg (10/16/2019), 6 mg (11/06/2019) CARBOplatin (PARAPLATIN) 700 mg in sodium chloride 0.9 % 250 mL chemo infusion, 700 mg (100 % of original dose 700 mg), Intravenous,   Once, 6 of 6 cycles Dose modification: 700 mg (original dose 700 mg, Cycle 1), 700 mg (original dose 700 mg, Cycle 5), 600 mg (original dose 700 mg, Cycle 5, Reason: Other (see comments), Comment: dose reduce to 600 mg for CINV per [name]) Administration: 700 mg (09/02/2019), 700 mg (09/23/2019), 600 mg (11/25/2019), 600 mg (12/16/2019), 700 mg (10/14/2019), 600 mg (11/04/2019) DOCEtaxel (TAXOTERE) 150 mg in sodium chloride 0.9 % 250 mL chemo infusion, 75 mg/m2 = 150 mg, Intravenous,  Once, 6 of 6 cycles Dose modification: 50 mg/m2 (original dose 75 mg/m2, Cycle 5, Reason: Dose not tolerated), 50 mg/m2 (original dose 75 mg/m2, Cycle 4, Reason: Dose not tolerated) Administration: 150 mg (09/02/2019), 150 mg (09/23/2019), 100 mg (11/25/2019), 100 mg (12/16/2019), 150 mg (10/14/2019), 100 mg (11/04/2019) fosaprepitant (EMEND) 150 mg in sodium chloride 0.9 % 145 mL IVPB, 150 mg, Intravenous,  Once, 6 of 6 cycles Administration: 150 mg (09/02/2019), 150 mg (09/23/2019), 150 mg (11/25/2019), 150 mg (12/16/2019), 150 mg (10/14/2019), 150 mg (11/04/2019) pertuzumab (PERJETA) 420 mg in sodium chloride 0.9 % 250 mL chemo infusion, 420 mg (100 % of original dose 420 mg), Intravenous, Once, 5 of 5 cycles Dose modification: 420 mg (original dose 420 mg, Cycle 1, Reason: Provider Judgment) Administration: 420 mg (09/02/2019), 420 mg (09/23/2019), 420 mg (11/25/2019), 420 mg (10/14/2019), 420 mg (11/04/2019) trastuzumab-dkst (OGIVRI) 672 mg in sodium chloride 0.9 % 250 mL chemo infusion, 8 mg/kg = 672 mg, Intravenous,  Once, 6 of 6 cycles Administration: 672 mg (09/02/2019), 504 mg (09/23/2019), 504 mg (11/25/2019), 504 mg (12/16/2019), 504 mg (10/14/2019), 504 mg (11/04/2019)  for chemotherapy treatment.    01/06/2020 - 08/24/2020 Chemotherapy         01/14/2020 Surgery   Right lumpectomy Donne Hazel): no evidence of residual carcinoma, 4 right axillary lymph nodes negative for carcinoma.   02/16/2020 - 03/30/2020 Radiation Therapy   Adjuvant  radiation       Interval history-: Stephanie Brewer is a 56 y.o. female with oncologic history as above presenting to [facility] today with chief complaint of abdominal pain and urinary frequency.  Patient is accompanied by her son who provides additional history.  Patient tells me she has had abdominal pain x1 year.  The pain is located in the center of her lower abdomen.  Pain waxes and wanes in severity.  The pain has been worse over the last week.  She describes the pain as burning sensation. Pain does not radiate. Movement can make pain worse. She has had decreased appetite and fluid intake secondary to pain. Her last BM was x 3 days ago. She admits to passing flatus today. No OTC medications tried PTA.  She was febrile to 102 x 5 days ago. She has coworkers sick with viral illnesses at that time. Patient herself denied any URI symptoms Patient has had multiple urinary tract infections in the last year.  Her most recent being in April 2023.  Chart review shows urine culture was positive for Enterococcus faecalis and e. Coli.  She has had urinary frequency x1 day and feels as if she cannot fully empty her bladder.  She does admit to a history of kidney stones and currently denies any flank  pain or gross hematuria.  Patient is confident she does not have a kidney stone right now as she previously experienced how terrible they can feel. She also admits to dyspareunia which has been intermittent x 1 year. She has one sexual partner. She denies any history of ovarian cysts, pelvic pain or vaginal discharge. Her last pap smear was normal per patient.     ROS  All other systems are reviewed and are negative for acute change except as noted in the HPI.    Allergies  Allergen Reactions   Hyoscyamine Anaphylaxis   Doxycycline     Racing heart.   Nexium [Esomeprazole]     Swollen throat     Past Medical History:  Diagnosis Date   Cancer ([place])    DVT (deep venous thrombosis) ([facility])    left leg knee  and ankle    GERD (gastroesophageal reflux disease)      Past Surgical History:  Procedure Laterality Date   BREAST IMPLANT REMOVAL Bilateral 01/14/2020   Procedure: REMOVAL BREAST IMPLANTS;  Surgeon: Cindra Presume, MD;  Location: [place];  Service: Plastics;  Laterality: Bilateral;   BREAST LUMPECTOMY WITH RADIOACTIVE SEED AND SENTINEL LYMPH NODE BIOPSY Right 01/14/2020   Procedure: Right breast seed localized lumpectomy with sentinel lymph node biopsy;  Surgeon: Rolm Bookbinder, MD;  Location: [place];  Service: General;  Laterality: Right;   BREAST SURGERY     Augmentation 2003   HERNIA REPAIR  2002   LIPOSUCTION WITH LIPOFILLING Bilateral 11/29/2020   Procedure: LIPOSUCTION WITH LIPOFILLING FROM ABDOMEN TO BILATERAL BREASTS;  Surgeon: Cindra Presume, MD;  Location: [place];  Service: Plastics;  Laterality: Bilateral;   MASTOPEXY Left 11/29/2020   Procedure: LEFT MASTOPEXY;  Surgeon: Cindra Presume, MD;  Location: [place];  Service: Plastics;  Laterality: Left;   PORTACATH PLACEMENT N/A 09/01/2019   Procedure: INSERTION PORT-A-CATH WITH ULTRASOUND GUIDANCE;  Surgeon: Rolm Bookbinder, MD;  Location: [place];  Service: General;  Laterality: N/A;   TRIGGER FINGER RELEASE Left 05/09/2020   Procedure: RELEASE TRIGGER FINGER/A-1 PULLEY LEFT LONG;  Surgeon: Leanora Cover, MD;  Location: [place];  Service: Orthopedics;  Laterality: Left;    Social History   Socioeconomic History   Marital status: Married    Spouse name: Not on file   Number of children: 2   Years of education: some college   Highest education level: Not on file  Occupational History   Occupation: [employer]  Tobacco Use   Smoking status: Never   Smokeless tobacco: Never  Vaping Use   Vaping Use: Never used  Substance and Sexual Activity   Alcohol use: Not Currently    Comment: rare-twice monthly or  less   Drug use: No   Sexual activity: Yes    Birth control/protection: I.U.D.  Other Topics Concern   Not on file  Social History Narrative   Lives with spouse   Caffeine use: no caffeine    Left handed    Works for an Forensic psychologist to read fiction   Social Determinants of Radio broadcast assistant Strain: Not on file  Food Insecurity: Not on file  Transportation Needs: Not on file  Physical Activity: Not on file  Stress: Not on file  Social Connections: Not on file  Intimate Partner Violence: Not on file    Family History  Problem Relation Age of Onset  Hypertension Father      Current Outpatient Medications:    ciprofloxacin (CIPRO) 500 MG tablet, Take 1 tablet (500 mg total) by mouth 2 (two) times daily for 5 days., Disp: 10 tablet, Rfl: 0   alendronate (FOSAMAX) 70 MG tablet, Take 1 tablet (70 mg total) by mouth once a week. Take with a full glass of water on an empty stomach., Disp: 12 tablet, Rfl: 3   anastrozole (ARIMIDEX) 1 MG tablet, TAKE ONE TABLET ONCE DAILY, Disp: 90 tablet, Rfl: 3   calcium-vitamin D (OSCAL WITH D) 500-200 MG-UNIT tablet, Take 1 tablet by mouth in the morning and at bedtime. (Patient not taking: Reported on 11/15/2021), Disp: , Rfl:    diphenoxylate-atropine (LOMOTIL) 2.5-0.025 MG tablet, TAKE TWO TABLETS FOUR TIMES DAILY AS NEEDED FOR DIARRHEA, Disp: 30 tablet, Rfl: 2   glucose blood test strip, UAD to check sugar. R73.03, Disp: 100 each, Rfl: 12   Lancets Thin MISC, UAD to check sugar. R73.03, Disp: 100 each, Rfl: 12   loperamide (IMODIUM) 2 MG capsule, Take 2 mg by mouth as needed for diarrhea or loose stools. Take 2 tabs (4 mg) with first loose stool, then 1 tab (2 mg) with each additional loose stool as needed. Do not take more than 8 tabs (16 mg) in a 24 hour period., Disp: , Rfl:    Neratinib Maleate (NERLYNX) 40 MG tablet, Take 3 tablets (120 mg total) by mouth daily. Take with food., Disp: 84 tablet, Rfl: 1   rivaroxaban  (XARELTO) 20 MG TABS tablet, Take 1 tablet (20 mg total) by mouth daily with supper., Disp: 30 tablet, Rfl: 12   Semaglutide-Weight Management (WEGOVY) 1 MG/0.5ML SOAJ, Inject 1 mg into the skin every 7 (seven) days. Month #3, Disp: 2 mL, Rfl: 0   Semaglutide-Weight Management (WEGOVY) 1.7 MG/0.75ML SOAJ, Inject 1.7 mg into the skin every 7 (seven) days. Month#4, Disp: 3 mL, Rfl: 0   Semaglutide-Weight Management (WEGOVY) 2.4 MG/0.75ML SOAJ, Inject 2.4 mg into the skin every 7 (seven) days., Disp: 9 mL, Rfl: 3  PHYSICAL EXAM: ECOG FS:1 - Symptomatic but completely ambulatory    Vitals:   12/25/21 1654  BP: (!) 127/94  Pulse: 84  Resp: 18  Temp: 98.4 F (36.9 C)  SpO2: 100%  Weight: 142 lb (64.4 kg)   Physical Exam Vitals and nursing note reviewed.  Constitutional:      Appearance: She is well-developed. She is not ill-appearing or toxic-appearing.  HENT:     Head: Normocephalic.     Nose: Nose normal.     Mouth/Throat:     Mouth: Mucous membranes are dry.  Eyes:     Conjunctiva/sclera: Conjunctivae normal.  Neck:     Vascular: No JVD.  Cardiovascular:     Rate and Rhythm: Normal rate and regular rhythm.     Pulses: Normal pulses.     Heart sounds: Normal heart sounds.  Pulmonary:     Effort: Pulmonary effort is normal.     Breath sounds: Normal breath sounds.  Abdominal:     General: There is no distension.     Tenderness: There is abdominal tenderness in the left lower quadrant.     Comments: Voluntary guarding. No peritoneal signs  Musculoskeletal:     Cervical back: Normal range of motion.  Skin:    General: Skin is warm and dry.  Neurological:     Mental Status: She is oriented to person, place, and time.  LABORATORY DATA: I have reviewed the data as listed    Latest Ref Rng & Units 12/25/2021    2:13 PM 12/06/2021    8:31 AM 11/27/2021    9:33 AM  CBC  WBC 4.0 - 10.5 K/uL 4.6  4.4  4.3   Hemoglobin 12.0 - 15.0 g/dL 12.4  12.7  13.2   Hematocrit  36.0 - 46.0 % 37.0  38.1  39.0   Platelets 150 - 400 K/uL 318  226  247         Latest Ref Rng & Units 12/25/2021    2:13 PM 12/06/2021    8:31 AM 11/27/2021    9:33 AM  CMP  Glucose 70 - 99 mg/dL 100  96  103   BUN 6 - 20 mg/dL 16  11  13    Creatinine 0.44 - 1.00 mg/dL 0.77  0.91  0.86   Sodium 135 - 145 mmol/L 137  139  141   Potassium 3.5 - 5.1 mmol/L 3.5  3.6  3.3   Chloride 98 - 111 mmol/L 98  105  104   CO2 22 - 32 mmol/L 30  28  30    Calcium 8.9 - 10.3 mg/dL 9.3  9.1  9.8   Total Protein 6.5 - 8.1 g/dL 7.4  6.7  7.0   Total Bilirubin 0.3 - 1.2 mg/dL 0.5  0.7  0.6   Alkaline Phos 38 - 126 U/L 72  74  79   AST 15 - 41 U/L 10  16  14    ALT 0 - 44 U/L 7  11  11         RADIOGRAPHIC STUDIES (from last 24 hours if applicable) I have personally reviewed the radiological images as listed and agreed with the findings in the report. No results found.      Visit Diagnosis: 1. Dehydration   2. Left lower quadrant abdominal pain   3. Malignant neoplasm of upper-outer quadrant of right breast in female, estrogen receptor positive ([place])   4. Urinary tract infection with hematuria, site unspecified      Orders Placed This Encounter  Procedures   CT Abdomen Pelvis W Contrast    Standing Status:   Future    Standing Expiration Date:   12/25/2022    Order Specific Question:   If indicated for the ordered procedure, I authorize the administration of contrast media per Radiology protocol    Answer:   Yes    Order Specific Question:   Is patient pregnant?    Answer:   Unknown (Please Explain)    Order Specific Question:   Preferred imaging location?    Answer:   [facility]    Order Specific Question:   Is Oral Contrast requested for this exam?    Answer:   Yes, Per Radiology protocol    All questions were answered. The patient knows to call the clinic with any problems, questions or concerns. No barriers to learning was detected.  I have spent a total of 30 minutes  minutes of face-to-face and non-face-to-face time, preparing to see the patient, obtaining and/or reviewing separately obtained history, performing a medically appropriate examination, counseling and educating the patient, ordering tests, documenting clinical information in the electronic health record, and care coordination (communications with other health care professionals or caregivers).    Thank you for allowing me to participate in the care of this patient.    Barrie Folk, PA-C Department of Hematology/Oncology [place]  [facility] Phone: [phone]  [phone]    12/25/2021 4:55 PM

## 2021-12-25 NOTE — Progress Notes (Signed)
Orders entered for [facility] visit for fever work up.

## 2021-12-25 NOTE — Telephone Encounter (Signed)
Called and s/w pt in regards to reporting abd discomfort, swelling, bloating, burning sensation in abd. Pt reports sometimes she has nausea/vomiting. Pt has been taking Neratinib since September 2022 and will finish her 1 year mid October d/t stopping for one week for antibiotic therapy.   She reports she missed work last week d/t the abd discomfort. Pt started taking Wegovy in May. [name] was titrated from 0.25 mg, 0.5 mg to 1 mg, this week she starts 1.7 mg.   Consulted with our pharmacist, Gilford Rile to discuss use of Neratinib in combination with [facility] which does cause these sx as well. [name] states he will call the pt to further discuss.

## 2021-12-25 NOTE — Telephone Encounter (Signed)
[place]       Telephone: [phone]?Fax: [phone]   Oncology Clinical Pharmacist Practitioner Progress Note  Stephanie Brewer is a 56 y.o. female with a diagnosis of breast cancer currently on extended adjuvant neratinib under the care of Dr. Nicholas Lose.   Ms. Whittle was contacted today by clinical pharmacy after speaking to Dr. Geralyn Flash nurse. Ms. Durney reports having lower abdominal pain that is not subsiding with occasional burning sensations. She also is reporting indigestion, fevers, and states she has not had a bowel movement in 3 days. We discussed going to her local urgent care but she states she does not like going there. We spoke to [name] about her symptoms and he stated she could consider being evaluated by our Symptom Management Clinic. This was discussed with Ms. Hunsucker and she would prefer this. Symptom Management Clinic was able to have her get labs at 1345 and see Milinda Cave, PA-C at 1400. We have instructed [name] to stop her neratinib at this time until symptoms improve. She verbalized understanding of the plan and all questions were answered to her satisfaction.  Clinical pharmacy will continue to support [name] and Dr. Nicholas Lose as needed.  [name], RPH-CPP,  12/25/2021  12:46 PM   **Disclaimer: This note was dictated with voice recognition software. Similar sounding words can inadvertently be transcribed and this note may contain transcription errors which may not have been corrected upon publication of note.**

## 2021-12-25 NOTE — Progress Notes (Signed)
Patient given oral contrast to take home. Patient verbalized understanding that she will be getting a phone call from [facility] tomorrow regarding CT appointment.

## 2021-12-26 ENCOUNTER — Encounter: Payer: Self-pay | Admitting: Plastic Surgery

## 2021-12-26 ENCOUNTER — Ambulatory Visit (HOSPITAL_BASED_OUTPATIENT_CLINIC_OR_DEPARTMENT_OTHER): Payer: BC Managed Care – PPO | Admitting: Physician Assistant

## 2021-12-26 ENCOUNTER — Ambulatory Visit (INDEPENDENT_AMBULATORY_CARE_PROVIDER_SITE_OTHER): Payer: BC Managed Care – PPO | Admitting: Plastic Surgery

## 2021-12-26 ENCOUNTER — Telehealth: Payer: Self-pay

## 2021-12-26 ENCOUNTER — Ambulatory Visit (HOSPITAL_COMMUNITY)
Admission: RE | Admit: 2021-12-26 | Discharge: 2021-12-26 | Disposition: A | Discharge status: Home / Self Care | Payer: BC Managed Care – PPO | Source: Ambulatory Visit | Attending: Physician Assistant | Admitting: Physician Assistant

## 2021-12-26 DIAGNOSIS — N390 Urinary tract infection, site not specified: Secondary | ICD-10-CM

## 2021-12-26 DIAGNOSIS — K5792 Diverticulitis of intestine, part unspecified, without perforation or abscess without bleeding: Secondary | ICD-10-CM | POA: Diagnosis not present

## 2021-12-26 DIAGNOSIS — R319 Hematuria, unspecified: Secondary | ICD-10-CM

## 2021-12-26 DIAGNOSIS — C50411 Malignant neoplasm of upper-outer quadrant of right female breast: Secondary | ICD-10-CM | POA: Diagnosis not present

## 2021-12-26 DIAGNOSIS — Z170 Estrogen receptor positive status [ER+]: Secondary | ICD-10-CM | POA: Diagnosis not present

## 2021-12-26 DIAGNOSIS — N281 Cyst of kidney, acquired: Secondary | ICD-10-CM | POA: Diagnosis not present

## 2021-12-26 DIAGNOSIS — R1032 Left lower quadrant pain: Secondary | ICD-10-CM | POA: Diagnosis not present

## 2021-12-26 DIAGNOSIS — K5730 Diverticulosis of large intestine without perforation or abscess without bleeding: Secondary | ICD-10-CM | POA: Diagnosis not present

## 2021-12-26 LAB — URINE CULTURE: Culture: 20000 — AB

## 2021-12-26 MED ORDER — METRONIDAZOLE 500 MG PO TABS: 500.0000 mg | ORAL_TABLET | Freq: Three times a day (TID) | ORAL | 0 refills | Status: AC

## 2021-12-26 MED ORDER — IOHEXOL 350 MG/ML SOLN
100.0000 mL | Freq: Once | INTRAVENOUS | Status: AC | PRN
  Administered 2021-12-26: 75 mL via INTRAVENOUS

## 2021-12-26 NOTE — Progress Notes (Signed)
I connected with [name] on 12/26/21 at  4:30 PM EDT by telephone and verified that I am speaking with the correct person using two identifiers.   I discussed the limitations, risks, security and privacy concerns of performing an evaluation and management service by telemedicine and the availability of in-person appointments. I also discussed with the patient that there may be a patient responsible charge related to this service. The patient expressed understanding and agreed to proceed.   Other persons participating in the visit and their role in the encounter: none   Patient's location: home  Provider's location: [facility] office       Symptom Management Consult note [place]    Patient Care Team: [name], DO as PCP - General (Family Medicine) Mauro Kaufmann, RN as Oncology Nurse Navigator Rockwell Germany, RN as Oncology Nurse Navigator Gwyndolyn Kaufman, RN (Inactive) as Registered Nurse Gwyndolyn Kaufman, RN (Inactive) as Registered Nurse Nicholas Lose, MD as Medical Oncologist (Hematology and Oncology) [name], RPH-CPP as Pharmacist (Hematology and Oncology)    Name of the patient: Stephanie Brewer  [id number]  1965-05-01   Date of visit: 12/26/2021    Chief complaint/ Reason for visit- CT results  Oncology History  Malignant neoplasm of upper-outer quadrant of right breast in female, estrogen receptor positive ([place])  07/28/2019 Initial Diagnosis   Screening mammogram detected right breast mass 11:30 position subareolar 1.3 cm, indeterminate 5 mm mass: Biopsy fibroadenoma, right axillary lymph node present, biopsy of the lymph node and the mass revealed grade 2 IDC ER 10%, PR 0%, Ki-67 45%, HER-2 +3+ by [facility]   08/20/2019 Cancer Staging   Staging form: Breast, AJCC 8th Edition - Clinical stage from 08/20/2019: Stage IIA (cT2, cN1, cM0, G2, ER+, PR-, HER2+) - Signed by Eppie Gibson, MD on 01/26/2020   09/02/2019 - 12/18/2019 Chemotherapy    The patient had dexamethasone (DECADRON) 4 MG tablet, 4 mg (100 % of original dose 4 mg), Oral, Daily, 1 of 1 cycle, Start date: 08/20/2019, End date: 01/06/2020 Dose modification: 4 mg (original dose 4 mg, Cycle 0) palonosetron (ALOXI) injection 0.25 mg, 0.25 mg, Intravenous,  Once, 6 of 6 cycles Administration: 0.25 mg (09/02/2019), 0.25 mg (09/23/2019), 0.25 mg (11/25/2019), 0.25 mg (12/16/2019), 0.25 mg (10/14/2019), 0.25 mg (11/04/2019) pegfilgrastim-jmdb (FULPHILA) injection 6 mg, 6 mg, Subcutaneous,  Once, 6 of 6 cycles Administration: 6 mg (09/04/2019), 6 mg (09/25/2019), 6 mg (11/27/2019), 6 mg (12/18/2019), 6 mg (10/16/2019), 6 mg (11/06/2019) CARBOplatin (PARAPLATIN) 700 mg in sodium chloride 0.9 % 250 mL chemo infusion, 700 mg (100 % of original dose 700 mg), Intravenous,  Once, 6 of 6 cycles Dose modification: 700 mg (original dose 700 mg, Cycle 1), 700 mg (original dose 700 mg, Cycle 5), 600 mg (original dose 700 mg, Cycle 5, Reason: Other (see comments), Comment: dose reduce to 600 mg for CINV per [name]) Administration: 700 mg (09/02/2019), 700 mg (09/23/2019), 600 mg (11/25/2019), 600 mg (12/16/2019), 700 mg (10/14/2019), 600 mg (11/04/2019) DOCEtaxel (TAXOTERE) 150 mg in sodium chloride 0.9 % 250 mL chemo infusion, 75 mg/m2 = 150 mg, Intravenous,  Once, 6 of 6 cycles Dose modification: 50 mg/m2 (original dose 75 mg/m2, Cycle 5, Reason: Dose not tolerated), 50 mg/m2 (original dose 75 mg/m2, Cycle 4, Reason: Dose not tolerated) Administration: 150 mg (09/02/2019), 150 mg (09/23/2019), 100 mg (11/25/2019), 100 mg (12/16/2019), 150 mg (10/14/2019), 100 mg (11/04/2019) fosaprepitant (EMEND) 150 mg in sodium chloride 0.9 % 145  mL IVPB, 150 mg, Intravenous,  Once, 6 of 6 cycles Administration: 150 mg (09/02/2019), 150 mg (09/23/2019), 150 mg (11/25/2019), 150 mg (12/16/2019), 150 mg (10/14/2019), 150 mg (11/04/2019) pertuzumab (PERJETA) 420 mg in sodium chloride 0.9 % 250 mL chemo infusion, 420 mg (100 % of original dose 420  mg), Intravenous, Once, 5 of 5 cycles Dose modification: 420 mg (original dose 420 mg, Cycle 1, Reason: Provider Judgment) Administration: 420 mg (09/02/2019), 420 mg (09/23/2019), 420 mg (11/25/2019), 420 mg (10/14/2019), 420 mg (11/04/2019) trastuzumab-dkst (OGIVRI) 672 mg in sodium chloride 0.9 % 250 mL chemo infusion, 8 mg/kg = 672 mg, Intravenous,  Once, 6 of 6 cycles Administration: 672 mg (09/02/2019), 504 mg (09/23/2019), 504 mg (11/25/2019), 504 mg (12/16/2019), 504 mg (10/14/2019), 504 mg (11/04/2019)  for chemotherapy treatment.    01/06/2020 - 08/24/2020 Chemotherapy         01/14/2020 Surgery   Right lumpectomy Donne Hazel): no evidence of residual carcinoma, 4 right axillary lymph nodes negative for carcinoma.   02/16/2020 - 03/30/2020 Radiation Therapy   Adjuvant radiation     Current Therapy: Neratinib, anastrozole, alendronate   Interval history- Stephanie Brewer is a 56 y.o. female with oncologic history as above contacted today via telephone to discuss results of CT scan.  Patient seen yesterday in [facility] for abdominal pain and 1 day of fever.  CT scan performed earlier this afternoon showing uncomplicated sigmoid diverticulitis.  Patient states she is still having intermittent abdominal pain in the left lower quadrant.  She takes Tylenol for pain and avoids narcotics as she does not like the way they make her feel.  She does feel as if her pain can be managed with Tylenol at this time.  She denies any fever in the last 24 hours.  Patient was also found to have a urinary tract infection yesterday and was prescribed Cipro which she has started taking.  She continues to have decreased appetite, feels slightly better after IV fluids yesterday.     ROS  All other systems are reviewed and are negative for acute change except as noted in the HPI.    Allergies  Allergen Reactions   Hyoscyamine Anaphylaxis   Doxycycline     Racing heart.   Nexium [Esomeprazole]     Swollen throat      Past Medical History:  Diagnosis Date   Cancer ([place])    DVT (deep venous thrombosis) ([facility])    left leg knee and ankle    GERD (gastroesophageal reflux disease)      Past Surgical History:  Procedure Laterality Date   BREAST IMPLANT REMOVAL Bilateral 01/14/2020   Procedure: REMOVAL BREAST IMPLANTS;  Surgeon: Cindra Presume, MD;  Location: [place];  Service: Plastics;  Laterality: Bilateral;   BREAST LUMPECTOMY WITH RADIOACTIVE SEED AND SENTINEL LYMPH NODE BIOPSY Right 01/14/2020   Procedure: Right breast seed localized lumpectomy with sentinel lymph node biopsy;  Surgeon: Rolm Bookbinder, MD;  Location: [place];  Service: General;  Laterality: Right;   BREAST SURGERY     Augmentation 2003   HERNIA REPAIR  2002   LIPOSUCTION WITH LIPOFILLING Bilateral 11/29/2020   Procedure: LIPOSUCTION WITH LIPOFILLING FROM ABDOMEN TO BILATERAL BREASTS;  Surgeon: Cindra Presume, MD;  Location: [place];  Service: Plastics;  Laterality: Bilateral;   MASTOPEXY Left 11/29/2020   Procedure: LEFT MASTOPEXY;  Surgeon: Cindra Presume, MD;  Location: [place];  Service: Plastics;  Laterality: Left;   PORTACATH  PLACEMENT N/A 09/01/2019   Procedure: INSERTION PORT-A-CATH WITH ULTRASOUND GUIDANCE;  Surgeon: Rolm Bookbinder, MD;  Location: [place];  Service: General;  Laterality: N/A;   TRIGGER FINGER RELEASE Left 05/09/2020   Procedure: RELEASE TRIGGER FINGER/A-1 PULLEY LEFT LONG;  Surgeon: Leanora Cover, MD;  Location: [place];  Service: Orthopedics;  Laterality: Left;    Social History   Socioeconomic History   Marital status: Married    Spouse name: Not on file   Number of children: 2   Years of education: some college   Highest education level: Not on file  Occupational History   Occupation: [employer]  Tobacco Use   Smoking status: Never   Smokeless tobacco: Never  Vaping Use    Vaping Use: Never used  Substance and Sexual Activity   Alcohol use: Not Currently    Comment: rare-twice monthly or less   Drug use: No   Sexual activity: Yes    Birth control/protection: I.U.D.  Other Topics Concern   Not on file  Social History Narrative   Lives with spouse   Caffeine use: no caffeine    Left handed    Works for an Forensic psychologist to read fiction   Social Determinants of Radio broadcast assistant Strain: Not on file  Food Insecurity: Not on file  Transportation Needs: Not on file  Physical Activity: Not on file  Stress: Not on file  Social Connections: Not on file  Intimate Partner Violence: Not on file    Family History  Problem Relation Age of Onset   Hypertension Father      Current Outpatient Medications:    alendronate (FOSAMAX) 70 MG tablet, Take 1 tablet (70 mg total) by mouth once a week. Take with a full glass of water on an empty stomach., Disp: 12 tablet, Rfl: 3   anastrozole (ARIMIDEX) 1 MG tablet, TAKE ONE TABLET ONCE DAILY, Disp: 90 tablet, Rfl: 3   calcium-vitamin D (OSCAL WITH D) 500-200 MG-UNIT tablet, Take 1 tablet by mouth in the morning and at bedtime. (Patient not taking: Reported on 11/15/2021), Disp: , Rfl:    ciprofloxacin (CIPRO) 500 MG tablet, Take 1 tablet (500 mg total) by mouth 2 (two) times daily for 5 days., Disp: 10 tablet, Rfl: 0   diphenoxylate-atropine (LOMOTIL) 2.5-0.025 MG tablet, TAKE TWO TABLETS FOUR TIMES DAILY AS NEEDED FOR DIARRHEA, Disp: 30 tablet, Rfl: 2   glucose blood test strip, UAD to check sugar. R73.03, Disp: 100 each, Rfl: 12   Lancets Thin MISC, UAD to check sugar. R73.03, Disp: 100 each, Rfl: 12   loperamide (IMODIUM) 2 MG capsule, Take 2 mg by mouth as needed for diarrhea or loose stools. Take 2 tabs (4 mg) with first loose stool, then 1 tab (2 mg) with each additional loose stool as needed. Do not take more than 8 tabs (16 mg) in a 24 hour period., Disp: , Rfl:    Neratinib Maleate (NERLYNX)  40 MG tablet, Take 3 tablets (120 mg total) by mouth daily. Take with food., Disp: 84 tablet, Rfl: 1   rivaroxaban (XARELTO) 20 MG TABS tablet, Take 1 tablet (20 mg total) by mouth daily with supper., Disp: 30 tablet, Rfl: 12   Semaglutide-Weight Management (WEGOVY) 1 MG/0.5ML SOAJ, Inject 1 mg into the skin every 7 (seven) days. Month #3, Disp: 2 mL, Rfl: 0   Semaglutide-Weight Management (WEGOVY) 1.7 MG/0.75ML SOAJ, Inject 1.7 mg into the skin every 7 (seven)  days. Month#4, Disp: 3 mL, Rfl: 0   Semaglutide-Weight Management (WEGOVY) 2.4 MG/0.75ML SOAJ, Inject 2.4 mg into the skin every 7 (seven) days., Disp: 9 mL, Rfl: 3  PHYSICAL EXAM: ECOG FS:1 - Symptomatic but completely ambulatory   There were no vitals filed for this visit.  Patient speaking in clear sentences, no respiratory distress    LABORATORY DATA: I have reviewed the data as listed    Latest Ref Rng & Units 12/25/2021    2:13 PM 12/06/2021    8:31 AM 11/27/2021    9:33 AM  CBC  WBC 4.0 - 10.5 K/uL 4.6  4.4  4.3   Hemoglobin 12.0 - 15.0 g/dL 12.4  12.7  13.2   Hematocrit 36.0 - 46.0 % 37.0  38.1  39.0   Platelets 150 - 400 K/uL 318  226  247         Latest Ref Rng & Units 12/25/2021    2:13 PM 12/06/2021    8:31 AM 11/27/2021    9:33 AM  CMP  Glucose 70 - 99 mg/dL 100  96  103   BUN 6 - 20 mg/dL 16  11  13    Creatinine 0.44 - 1.00 mg/dL 0.77  0.91  0.86   Sodium 135 - 145 mmol/L 137  139  141   Potassium 3.5 - 5.1 mmol/L 3.5  3.6  3.3   Chloride 98 - 111 mmol/L 98  105  104   CO2 22 - 32 mmol/L 30  28  30    Calcium 8.9 - 10.3 mg/dL 9.3  9.1  9.8   Total Protein 6.5 - 8.1 g/dL 7.4  6.7  7.0   Total Bilirubin 0.3 - 1.2 mg/dL 0.5  0.7  0.6   Alkaline Phos 38 - 126 U/L 72  74  79   AST 15 - 41 U/L 10  16  14    ALT 0 - 44 U/L 7  11  11         RADIOGRAPHIC STUDIES: I have personally reviewed the radiological images as listed and agreed with the findings in the report. No images are attached to the  encounter. CT Abdomen Pelvis W Contrast  Result Date: 12/26/2021 CLINICAL DATA:  Left lower quadrant abdominal pain EXAM: CT ABDOMEN AND PELVIS WITH CONTRAST TECHNIQUE: Multidetector CT imaging of the abdomen and pelvis was performed using the standard protocol following bolus administration of intravenous contrast. RADIATION DOSE REDUCTION: This exam was performed according to the departmental dose-optimization program which includes automated exposure control, adjustment of the mA and/or kV according to patient size and/or use of iterative reconstruction technique. CONTRAST:  54m OMNIPAQUE IOHEXOL 350 MG/ML SOLN COMPARISON:  06/21/2021 CT stone study. FINDINGS: Lower chest: Right middle lobe scarring. Normal heart size without pericardial or pleural effusion. Hepatobiliary: Normal liver. Normal gallbladder, without biliary ductal dilatation. Pancreas: Normal, without mass or ductal dilatation. Spleen: Normal in size, without focal abnormality. Adrenals/Urinary Tract: Normal adrenal glands. Too small to characterize lesions in both kidneys. Inter/upper pole right renal 3.8 cm cyst . In the absence of clinically indicated signs/symptoms require(s) no independent follow-up. Normal urinary bladder. Stomach/Bowel: Normal stomach, without wall thickening. Scattered colonic diverticula. Wall thickening of and edema surrounding the sigmoid is relatively mild including on 73/3. No free perforation or drainable fluid collection. Normal terminal ileum and appendix. Normal small bowel. Vascular/Lymphatic: Aortic atherosclerosis. Circumaortic left renal vein. No abdominopelvic adenopathy. Reproductive: Normal uterus and adnexa. Other: No significant free fluid. Musculoskeletal: Mild osteopenia.  Disc  bulges at L3-4, L4-5, L5-S1. IMPRESSION: 1. Noncomplicated sigmoid diverticulitis 2.  Aortic Atherosclerosis (ICD10-I70.0). Electronically Signed   By: Abigail Miyamoto M.D.   On: 12/26/2021 16:03     ASSESSMENT & PLAN: Patient  is a 56 y.o. female with oncologic history of ER positive right breast cancer followed by [name].  #) Uncomplicated Diverticulitis -Discussed CT results with patient.  As she had fever last week and ongoing pain will treat with antibiotics at this time.  Patient is stable for outpatient management.  She was prescribed Cipro yesterday for the UTI so encouraged her to continue that and metronidazole will be prescribed in addition.  -Encouraged patient to follow up with PCP as needed. -Strict ED precautions discussed should symptoms worsen.   #) UTI -Will contact patient if urine culture susceptibility requires antibiotic change.    Visit Diagnosis: No diagnosis found.   No orders of the defined types were placed in this encounter.   All questions were answered. The patient knows to call the clinic with any problems, questions or concerns. No barriers to learning was detected.  Time spent with patient on telephone encounter: 10 minutes   Thank you for allowing me to participate in the care of this patient.    Barrie Folk, PA-C Department of Hematology/Oncology [facility] at [facility] Phone: [phone]  [phone]    12/26/2021 4:40 PM

## 2021-12-26 NOTE — Telephone Encounter (Signed)
This RN called patient regarding CT scan scheduled for today. Patient verbalized understanding to arrive at [facility] at 2:30 for scan. Patient was instructed to drink the oral contrast and to be NPO 4 hours prior.

## 2021-12-26 NOTE — Progress Notes (Signed)
   Subjective:    Patient ID: Stephanie Brewer, female    DOB: [birth year], 56 y.o.   MRN: [id number]  The patient is a 56 year old female joining me by phone for further discussion about her breasts.  She had breast cancer treatment in 2021 for the right breast.  She had a partial mastectomy.  She had aesthetic implants that were placed previously removed at the same time.  They were reported to be subpectoral and around 450 cc.  She then had a left breast mastopexy in August 2021 for improved symmetry with some fat grafting.  She also had an abdominoplasty in the past so her abdominal fat may be compromised from the fat grafting standpoint.  She also had an abnormal ultrasound of the breast which required a biopsy.  Fortunately it showed fat necrosis.  She is still wanting to be slightly larger.  [facility] did go to [facility] and had implants made.  She has not received them yet so she has not tried them.  She understands some of the risks and is wanting to think about it a little bit further.  She is also having some medical problems that are being worked up with her physician.   Review of Systems  Constitutional: Negative.   Eyes: Negative.   Respiratory: Negative.    Cardiovascular: Negative.   Gastrointestinal: Negative.   Endocrine: Negative.   Genitourinary: Negative.   Musculoskeletal: Negative.        Objective:   Physical Exam        Assessment & Plan:     ICD-10-CM   1. Malignant neoplasm of upper-outer quadrant of right breast in female, estrogen receptor positive ([place])  C50.411    Z17.0       I connected with  Stephanie Brewer on 12/26/21 by phone and verified that I am speaking with the correct person using two identifiers. The patient was at work and I was at the office.  We spent 10 minutes in discussion.   I discussed the limitations of evaluation and management by telemedicine. The patient expressed understanding and agreed to proceed.  I am still concerned about  trying to put in more fat in the radiated breast and not having great fat available due to her previous surgeries.  She still is hesitant to put an implant in and I agree with her on that.  I recommend that she get her medical conditions resolved.  She also should try the bras and see how they fit.  I would like to see her back in a couple of months.

## 2021-12-30 LAB — CULTURE, BLOOD (SINGLE)
Culture: NO GROWTH
Culture: NO GROWTH
Special Requests: ADEQUATE
Special Requests: ADEQUATE

## 2022-01-02 ENCOUNTER — Telehealth: Payer: Self-pay | Admitting: *Deleted

## 2022-01-02 NOTE — Telephone Encounter (Signed)
Received on (01/02/22) via of fax DME Standard Written Order from [facility].  Requesting signature and return.  Given to provider to sign.  DME Standard Written Order signed and faxed back to [facility].  Confirmation received and copy scanned into the chart.//AB/CMA

## 2022-01-04 ENCOUNTER — Encounter: Payer: Self-pay | Admitting: Physician Assistant

## 2022-01-05 ENCOUNTER — Ambulatory Visit (INDEPENDENT_AMBULATORY_CARE_PROVIDER_SITE_OTHER): Payer: BC Managed Care – PPO | Admitting: Family Medicine

## 2022-01-05 ENCOUNTER — Encounter: Payer: Self-pay | Admitting: Family Medicine

## 2022-01-05 VITALS — BP 123/79 | HR 69 | Temp 98.3°F | Ht 64.0 in | Wt 144.0 lb

## 2022-01-05 DIAGNOSIS — R300 Dysuria: Secondary | ICD-10-CM | POA: Diagnosis not present

## 2022-01-05 DIAGNOSIS — N3020 Other chronic cystitis without hematuria: Secondary | ICD-10-CM

## 2022-01-05 LAB — URINALYSIS, ROUTINE W REFLEX MICROSCOPIC
Bilirubin, UA: NEGATIVE
Glucose, UA: NEGATIVE
Leukocytes,UA: NEGATIVE
Nitrite, UA: NEGATIVE
Protein,UA: NEGATIVE
Specific Gravity, UA: >1.03 — ABNORMAL HIGH (ref 1.005–1.030)
Urobilinogen, Ur: 0.2 mg/dL (ref 0.2–1.0)
pH, UA: 5 (ref 5.0–7.5)

## 2022-01-05 LAB — MICROSCOPIC EXAMINATION
Bacteria, UA: NONE SEEN
Renal Epithel, UA: NONE SEEN /hpf

## 2022-01-05 MED ORDER — AMOXICILLIN-POT CLAVULANATE 875-125 MG PO TABS: 1.0000 | ORAL_TABLET | Freq: Two times a day (BID) | ORAL | 0 refills | Status: DC

## 2022-01-05 MED ORDER — PHENAZOPYRIDINE HCL 200 MG PO TABS: 200.0000 mg | ORAL_TABLET | Freq: Three times a day (TID) | ORAL | 0 refills | Status: AC | PRN

## 2022-01-05 NOTE — Progress Notes (Signed)
Subjective: CC: UTI PCP: [name], DO OZD:GUYQ L Keithley is a 56 y.o. female presenting to clinic today for:  1. Urinary symptoms Patient reports a several week h/o pelvic pressure and most recently urinary frequency, urgency.  Experienced some back pain in the lumbar region.  No hematuria, fevers, chills,  nausea, vomiting,   vaginal discharge.  Patient has used Cipro/ Flagyl for symptoms.  Patient denies a h/o frequent or recurrent UTIs.      ROS: Per HPI  Allergies  Allergen Reactions   Hyoscyamine Anaphylaxis   Doxycycline     Racing heart.   Nexium [Esomeprazole]     Swollen throat   Past Medical History:  Diagnosis Date   Cancer ([place])    DVT (deep venous thrombosis) ([facility])    left leg knee and ankle    GERD (gastroesophageal reflux disease)     Current Outpatient Medications:    alendronate (FOSAMAX) 70 MG tablet, Take 1 tablet (70 mg total) by mouth once a week. Take with a full glass of water on an empty stomach., Disp: 12 tablet, Rfl: 3   anastrozole (ARIMIDEX) 1 MG tablet, TAKE ONE TABLET ONCE DAILY, Disp: 90 tablet, Rfl: 3   diphenoxylate-atropine (LOMOTIL) 2.5-0.025 MG tablet, TAKE TWO TABLETS FOUR TIMES DAILY AS NEEDED FOR DIARRHEA, Disp: 30 tablet, Rfl: 2   glucose blood test strip, UAD to check sugar. R73.03, Disp: 100 each, Rfl: 12   Lancets Thin MISC, UAD to check sugar. R73.03, Disp: 100 each, Rfl: 12   loperamide (IMODIUM) 2 MG capsule, Take 2 mg by mouth as needed for diarrhea or loose stools. Take 2 tabs (4 mg) with first loose stool, then 1 tab (2 mg) with each additional loose stool as needed. Do not take more than 8 tabs (16 mg) in a 24 hour period., Disp: , Rfl:    Neratinib Maleate (NERLYNX) 40 MG tablet, Take 3 tablets (120 mg total) by mouth daily. Take with food., Disp: 84 tablet, Rfl: 1   rivaroxaban (XARELTO) 20 MG TABS tablet, Take 1 tablet (20 mg total) by mouth daily with supper., Disp: 30 tablet, Rfl: 12   Semaglutide-Weight  Management (WEGOVY) 1 MG/0.5ML SOAJ, Inject 1 mg into the skin every 7 (seven) days. Month #3, Disp: 2 mL, Rfl: 0   Semaglutide-Weight Management (WEGOVY) 1.7 MG/0.75ML SOAJ, Inject 1.7 mg into the skin every 7 (seven) days. Month#4, Disp: 3 mL, Rfl: 0   Semaglutide-Weight Management (WEGOVY) 2.4 MG/0.75ML SOAJ, Inject 2.4 mg into the skin every 7 (seven) days., Disp: 9 mL, Rfl: 3   calcium-vitamin D (OSCAL WITH D) 500-200 MG-UNIT tablet, Take 1 tablet by mouth in the morning and at bedtime. (Patient not taking: Reported on 11/15/2021), Disp: , Rfl:  Social History   Socioeconomic History   Marital status: Married    Spouse name: Not on file   Number of children: 2   Years of education: some college   Highest education level: Not on file  Occupational History   Occupation: [employer]  Tobacco Use   Smoking status: Never   Smokeless tobacco: Never  Vaping Use   Vaping Use: Never used  Substance and Sexual Activity   Alcohol use: Not Currently    Comment: rare-twice monthly or less   Drug use: No   Sexual activity: Yes    Birth control/protection: I.U.D.  Other Topics Concern   Not on file  Social History Narrative   Lives with spouse   Caffeine use:  no caffeine    Left handed    Works for an Forensic psychologist to read fiction   Social Determinants of Radio broadcast assistant Strain: Not on [employer] Insecurity: Not on file  Transportation Needs: Not on file  Physical Activity: Not on file  Stress: Not on file  Social Connections: Not on file  Intimate Partner Violence: Not on file   Family History  Problem Relation Age of Onset   Hypertension Father     Objective: Office vital signs reviewed. BP 123/79   Pulse 69   Temp 98.3 F (36.8 C)   Ht '5\' 4"'$  (1.626 m)   Wt 144 lb (65.3 kg)   SpO2 100%   BMI 24.72 kg/m   Physical Examination:  General: Awake, alert, well nourished. nontoxic, No acute distress HEENT: sclera white, MMM GI: soft,  non-tender, non-distended, bowel sounds present x4, no hepatomegaly, no splenomegaly, no masses GU: +TTP suprapubic area. No CVA TTP  Assessment/ Plan: 56 y.o. female   Subacute cystitis - Plan: Urinalysis, Routine w reflex microscopic, amoxicillin-clavulanate (AUGMENTIN) 875-125 MG tablet, phenazopyridine (PYRIDIUM) 200 MG tablet, Urine Culture  I reviewed her UCx from 12/25/2021.  S agalactiae.  Treat with Augmentin BID x10days.  If no significant improvement over the weekend, plan for referral to urology for further evaluation ?IC?  Orders Placed This Encounter  Procedures   Urine Culture   Urinalysis, Routine w reflex microscopic   No orders of the defined types were placed in this encounter.    [name], DO [place] [phone]

## 2022-01-07 LAB — URINE CULTURE

## 2022-01-15 ENCOUNTER — Inpatient Hospital Stay: Payer: BC Managed Care – PPO | Admitting: Pharmacist

## 2022-01-15 ENCOUNTER — Other Ambulatory Visit: Payer: Self-pay

## 2022-01-15 ENCOUNTER — Inpatient Hospital Stay: Payer: BC Managed Care – PPO | Attending: Hematology

## 2022-01-15 VITALS — BP 118/77 | HR 84 | Temp 97.5°F | Resp 18 | Ht 64.0 in | Wt 139.6 lb

## 2022-01-15 DIAGNOSIS — Z170 Estrogen receptor positive status [ER+]: Secondary | ICD-10-CM | POA: Insufficient documentation

## 2022-01-15 DIAGNOSIS — C50411 Malignant neoplasm of upper-outer quadrant of right female breast: Secondary | ICD-10-CM | POA: Diagnosis not present

## 2022-01-15 LAB — CBC WITH DIFFERENTIAL (CANCER CENTER ONLY)
Abs Immature Granulocytes: 0.01 10*3/uL (ref 0.00–0.07)
Basophils Absolute: 0 10*3/uL (ref 0.0–0.1)
Basophils Relative: 1 %
Eosinophils Absolute: 0.1 10*3/uL (ref 0.0–0.5)
Eosinophils Relative: 4 %
HCT: 37.1 % (ref 36.0–46.0)
Hemoglobin: 12.3 g/dL (ref 12.0–15.0)
Immature Granulocytes: 0 %
Lymphocytes Relative: 27 %
Lymphs Abs: 1 10*3/uL (ref 0.7–4.0)
MCH: 28.9 pg (ref 26.0–34.0)
MCHC: 33.2 g/dL (ref 30.0–36.0)
MCV: 87.3 fL (ref 80.0–100.0)
Monocytes Absolute: 0.4 10*3/uL (ref 0.1–1.0)
Monocytes Relative: 11 %
Neutro Abs: 2.1 10*3/uL (ref 1.7–7.7)
Neutrophils Relative %: 57 %
Platelet Count: 218 10*3/uL (ref 150–400)
RBC: 4.25 MIL/uL (ref 3.87–5.11)
RDW: 15.1 % (ref 11.5–15.5)
WBC Count: 3.6 10*3/uL — ABNORMAL LOW (ref 4.0–10.5)
nRBC: 0 % (ref 0.0–0.2)

## 2022-01-15 LAB — CMP (CANCER CENTER ONLY)
ALT: 7 U/L (ref 0–44)
AST: 12 U/L — ABNORMAL LOW (ref 15–41)
Albumin: 4 g/dL (ref 3.5–5.0)
Alkaline Phosphatase: 62 U/L (ref 38–126)
Anion gap: 7 (ref 5–15)
BUN: 9 mg/dL (ref 6–20)
CO2: 29 mmol/L (ref 22–32)
Calcium: 9.4 mg/dL (ref 8.9–10.3)
Chloride: 106 mmol/L (ref 98–111)
Creatinine: 0.73 mg/dL (ref 0.44–1.00)
GFR, Estimated: >60 mL/min (ref >60)
Glucose, Bld: 98 mg/dL (ref 70–99)
Potassium: 3.8 mmol/L (ref 3.5–5.1)
Sodium: 142 mmol/L (ref 135–145)
Total Bilirubin: 0.7 mg/dL (ref 0.3–1.2)
Total Protein: 6.9 g/dL (ref 6.5–8.1)

## 2022-01-15 NOTE — Progress Notes (Signed)
[place]       Telephone: [phone]?Fax: [phone]   Oncology Clinical Pharmacist Practitioner Progress Note  Stephanie Brewer was contacted via in-person visit to discuss her chemotherapy regimen for neratinib which they receive under the care of Dr. Nicholas Lose.    Current treatment regimen and start date Neratinib (12/24/21) Anastrozole (07/13/20)   Interval History She continues to HOLD neratinib 3 tablets (120 mg) by mouth daily with food on days 1 to 28 of a 28-day cycle. This is being given  in combination with anastrozole.Therapy is planned to continue until  one year of extended adjuvant therapy, tentatively 12/24/22. Stephanie Brewer was seen today by clinical pharmacy as a follow-up to her neratinib management.  She was last seen by [name] on 08/24/21 and had a telephone visit with clinical pharmacy on 12/25/21.  She was seen by symptom management clinic and was thought to have a UTI and diverticulitis.  She was started on ciprofloxacin and metronidazole but stopped 4 days into the treatment due to not feeling well.  She then saw her PCP and was started on amoxicillin-clavulanate and states she finished 5 days of this treatment regimen.  She also stopped neratinib on 12/25/21.  Stephanie Brewer states she is feeling very good today.  She has not had any abdominal pain since last Tuesday.  She has been holding the semaglutide for about a month and restarted this yesterday.  We did show her some updated information about potential increased risks of pancreatitis, bowel obstruction, and gastroparesis with semaglutide.  She will next see her PCP in January and knows that she can contact Dr. Geralyn Flash clinic sooner if needed prior to her next visit with him in May of next year.  She has also been off alendronate for about 4 weeks and plans on restarting this next week when she has been on semaglutide for a week or so.  As above, she stopped neratinib after her latest GI symptoms  on 12/25/21.  She did not restart this medication as she would have been on it a year around this date.  Initially Stephanie Brewer had wanted to finish off the neratinib she still had on hand when she paused therapy due to diarrhea in the past.  However, as we had discussed in prior dictations, we felt that the risk outweighed the benefit and she has agreed to finish therapy at this time.  Clinical pharmacy has ordered a mammogram scheduled for May of next year which will be reviewed by [name] prior to his visit with her at that time.  Clinical pharmacy will continue to assist [name] and Stephanie Brewer on an as-needed basis going forward she has now finished her extended adjuvant neratinib therapy.  Allergies Allergies  Allergen Reactions   Hyoscyamine Anaphylaxis   Doxycycline     Racing heart.   Nexium [Esomeprazole]     Swollen throat    Vitals    01/15/2022    9:02 AM 01/05/2022    1:57 PM 12/25/2021    5:12 PM  Vitals with BMI  Height '5\' 4"'$  '5\' 4"'$    Weight 139 lbs 10 oz 144 lbs   BMI 40.97 35.32   Systolic 992 426 834  Diastolic 77 79 79  Pulse 84 69 74   Temp Readings from Last 3 Encounters:  01/15/22 (!) 97.5 F (36.4 C) (Tympanic)  01/05/22 98.3 F (36.8 C)  12/25/21 98.6 F (37 C) (Oral)     Laboratory  Data    Latest Ref Rng & Units 01/15/2022    8:22 AM 12/25/2021    2:13 PM 12/06/2021    8:31 AM  CBC EXTENDED  WBC 4.0 - 10.5 K/uL 3.6  4.6  4.4   RBC 3.87 - 5.11 MIL/uL 4.25  4.34  4.41   Hemoglobin 12.0 - 15.0 g/dL 12.3  12.4  12.7   HCT 36.0 - 46.0 % 37.1  37.0  38.1   Platelets 150 - 400 K/uL 218  318  226   NEUT# 1.7 - 7.7 K/uL 2.1  3.2  2.8   Lymph# 0.7 - 4.0 K/uL 1.0  0.8  0.9        Latest Ref Rng & Units 01/15/2022    8:22 AM 12/25/2021    2:13 PM 12/06/2021    8:31 AM  CMP  Glucose 70 - 99 mg/dL 98  100  96   BUN 6 - 20 mg/dL '9  16  11   '$ Creatinine 0.44 - 1.00 mg/dL 0.73  0.77  0.91   Sodium 135 - 145 mmol/L 142  137  139   Potassium 3.5 - 5.1  mmol/L 3.8  3.5  3.6   Chloride 98 - 111 mmol/L 106  98  105   CO2 22 - 32 mmol/L '29  30  28   '$ Calcium 8.9 - 10.3 mg/dL 9.4  9.3  9.1   Total Protein 6.5 - 8.1 g/dL 6.9  7.4  6.7   Total Bilirubin 0.3 - 1.2 mg/dL 0.7  0.5  0.7   Alkaline Phos 38 - 126 U/L 62  72  74   AST 15 - 41 U/L '12  10  16   '$ ALT 0 - 44 U/L '7  7  11     '$ Medication Reconciliation The patient's medication list was reviewed today with the patient?  Yes New medications or herbal supplements have recently been started?  No Any medications have been discontinued?  Yes, several medications removed from her med list including neratinib, diphenoxylate/atropine, loperamide, and amoxicillin clavulanate The medication list was updated and reconciled based on the patient's most recent medication list in the electronic medical record (EMR) including herbal products and OTC medications.   Medications Current Outpatient Medications  Medication Sig Dispense Refill   alendronate (FOSAMAX) 70 MG tablet Take 1 tablet (70 mg total) by mouth once a week. Take with a full glass of water on an empty stomach. 12 tablet 3   anastrozole (ARIMIDEX) 1 MG tablet TAKE ONE TABLET ONCE DAILY 90 tablet 3   calcium-vitamin D (OSCAL WITH D) 500-200 MG-UNIT tablet Take 1 tablet by mouth in the morning and at bedtime. (Patient not taking: Reported on 11/15/2021)     glucose blood test strip UAD to check sugar. R73.03 100 each 12   Lancets Thin MISC UAD to check sugar. R73.03 100 each 12   rivaroxaban (XARELTO) 20 MG TABS tablet Take 1 tablet (20 mg total) by mouth daily with supper. 30 tablet 12   Semaglutide-Weight Management (WEGOVY) 2.4 MG/0.75ML SOAJ Inject 2.4 mg into the skin every 7 (seven) days. 9 mL 3   No current facility-administered medications for this visit.    Drug-Drug Interactions (DDIs) DDIs were evaluated?  Yes Significant DDIs?  No The patient was instructed to speak with their health care provider and/or the oral chemotherapy  pharmacist before starting any new drug, including prescription or over the counter, natural / herbal products, or vitamins.  Supportive Care As  above, we did discuss the potential for increased risks of some GI toxicities with semaglutide and reviewed this with patient.  Dosing Assessment Hepatic adjustments needed?  No Renal adjustments needed?  No Toxicity adjustments needed?  No The current dosing regimen is not appropriate to continue at this time.  Stephanie Brewer has finished her extended adjuvant neratinib therapy at this time.  Follow-Up Plan Bilateral mammogram ordered for 08/15/22 Labs, [name] visit, on 08/23/22 Stephanie Brewer will continue to follow with her PCP who is managing her semaglutide.  Her next visit is scheduled for January of next year. Clinical pharmacy can be utilized by Stephanie Brewer and [name] on an as-needed basis going forward as Stephanie Brewer has now finished her extended adjuvant neratinib therapy of 1 year.  [name] participated in the discussion, expressed understanding, and voiced agreement with the above plan. All questions were answered to her satisfaction. The patient was advised to contact the clinic at [phone] with any questions or concerns prior to her return visit.   I spent 30 minutes assessing and educating the patient.  [name], RPH-CPP, 01/15/2022  9:42 AM   **Disclaimer: This note was dictated with voice recognition software. Similar sounding words can inadvertently be transcribed and this note may contain transcription errors which may not have been corrected upon publication of note.**

## 2022-01-31 ENCOUNTER — Ambulatory Visit (INDEPENDENT_AMBULATORY_CARE_PROVIDER_SITE_OTHER): Payer: BC Managed Care – PPO | Admitting: Family Medicine

## 2022-01-31 ENCOUNTER — Encounter: Payer: Self-pay | Admitting: Family Medicine

## 2022-01-31 VITALS — BP 108/72 | HR 107 | Temp 101.0°F | Ht 64.0 in | Wt 136.5 lb

## 2022-01-31 DIAGNOSIS — R509 Fever, unspecified: Secondary | ICD-10-CM | POA: Diagnosis not present

## 2022-01-31 DIAGNOSIS — J069 Acute upper respiratory infection, unspecified: Secondary | ICD-10-CM | POA: Diagnosis not present

## 2022-01-31 MED ORDER — BENZONATATE 100 MG PO CAPS: 100.0000 mg | ORAL_CAPSULE | Freq: Three times a day (TID) | ORAL | 0 refills | Status: DC | PRN

## 2022-01-31 MED ORDER — CHLORPHEN-PE-ACETAMINOPHEN 4-10-325 MG PO TABS: 1.0000 | ORAL_TABLET | Freq: Four times a day (QID) | ORAL | 0 refills | Status: DC | PRN

## 2022-01-31 NOTE — Progress Notes (Signed)
Acute Office Visit  Subjective:     Patient ID: Stephanie Brewer, female    DOB: [birth year], 56 y.o.   MRN: [id number]  Chief Complaint  Patient presents with   Fever    Fever  This is a new problem. The current episode started yesterday. The maximum temperature noted was 101 to 101.9 F. The temperature was taken using an oral thermometer. Associated symptoms include coughing, headaches, muscle aches and a sore throat. Pertinent negatives include no chest pain, congestion, nausea, vomiting or wheezing. Associated symptoms comments: Ear pressure. She has tried acetaminophen for the symptoms.  Risk factors: hx of cancer, immunosuppression and sick contacts (sick contacts were negative for Covid)      Review of Systems  Constitutional:  Positive for fever.  HENT:  Positive for sore throat. Negative for congestion.   Respiratory:  Positive for cough. Negative for wheezing.   Cardiovascular:  Negative for chest pain.  Gastrointestinal:  Negative for nausea and vomiting.  Neurological:  Positive for headaches.        Objective:    BP 108/72   Pulse (!) 107   Temp (!) 101 F (38.3 C) (Oral)   Ht '5\' 4"'$  (1.626 m)   Wt 136 lb 8 oz (61.9 kg)   SpO2 100%   BMI 23.43 kg/m    Physical Exam Vitals and nursing note reviewed.  Constitutional:      General: She is not in acute distress.    Appearance: She is not ill-appearing, toxic-appearing or diaphoretic.  HENT:     Head: Normocephalic and atraumatic.     Right Ear: Tympanic membrane, ear canal and external ear normal.     Left Ear: Tympanic membrane, ear canal and external ear normal.     Nose: Rhinorrhea present.     Mouth/Throat:     Mouth: Mucous membranes are moist.     Pharynx: Posterior oropharyngeal erythema present. No pharyngeal swelling.     Tonsils: No tonsillar exudate or tonsillar abscesses. 1+ on the right. 1+ on the left.  Eyes:     General:        Right eye: No discharge.        Left eye: No discharge.      Conjunctiva/sclera: Conjunctivae normal.  Cardiovascular:     Rate and Rhythm: Normal rate and regular rhythm.     Heart sounds: Normal heart sounds. No murmur heard. Pulmonary:     Effort: Pulmonary effort is normal. No respiratory distress.     Breath sounds: Normal breath sounds. No wheezing, rhonchi or rales.  Skin:    General: Skin is warm and dry.  Neurological:     General: No focal deficit present.     Mental Status: She is alert and oriented to person, place, and time.  Psychiatric:        Mood and Affect: Mood normal.     No results found for any visits on 01/31/22.      Assessment & Plan:   Stephanie Brewer was seen today for fever.  Diagnoses and all orders for this visit:  Viral URI with cough Negative Rapid flu. Covid, flu, RSV PCR pending, quarantine until results. Norel AD, tessalon perles ordered. Discussed symptomatic care and return precautions.  -     Veritor Flu A/B Waived -     benzonatate (TESSALON PERLES) 100 MG capsule; Take 1 capsule (100 mg total) by mouth 3 (three) times daily as needed for cough. -     Chlorphen-PE-Acetaminophen  4-10-325 MG TABS; Take 1 tablet by mouth every 6 (six) hours as needed. -     COVID-19, Flu A+B and RSV  The patient indicates understanding of these issues and agrees with the plan.  Gwenlyn Perking, FNP

## 2022-01-31 NOTE — Patient Instructions (Signed)
Viral Respiratory Infection A respiratory infection is an illness that affects part of the respiratory system, such as the lungs, nose, or throat. A respiratory infection that is caused by a virus is called a viral respiratory infection. Common types of viral respiratory infections include: A cold. The flu (influenza). A respiratory syncytial virus (RSV) infection. What are the causes? This condition is caused by a virus. The virus may spread through contact with droplets or direct contact with infected people or their mucus or secretions. The virus may spread from person to person (is contagious). What are the signs or symptoms? Symptoms of this condition include: A stuffy or runny nose. A sore throat or cough. Shortness of breath or difficulty breathing. Yellow or green mucus (sputum). Other symptoms may include: A fever. Sweating or chills. Fatigue. Achy muscles. A headache. How is this diagnosed? This condition may be diagnosed based on: Your symptoms. A physical exam. Testing of secretions from the nose or throat. Chest X-ray. How is this treated? This condition may be treated with medicines, such as: Antiviral medicine. This may shorten the length of time a person has symptoms. Expectorants. These make it easier to cough up mucus. Decongestant nasal sprays. Acetaminophen or NSAIDs, such as ibuprofen, to relieve fever and pain. Antibiotic medicines are not prescribed for viral infections.This is because antibiotics are designed to kill bacteria. They do not kill viruses. Follow these instructions at home: Managing pain and congestion Take over-the-counter and prescription medicines only as told by your health care provider. If you have a sore throat, gargle with a mixture of salt and water 3-4 times a day or as needed. To make salt water, completely dissolve -1 tsp (3-6 g) of salt in 1 cup (237 mL) of warm water. Use nose drops made from salt water to ease congestion and  soften raw skin around your nose. Take 2 tsp (10 mL) of honey at bedtime to lessen coughing at night. Do not give honey to children who are younger than 1 year. Drink enough fluid to keep your urine pale yellow. This helps prevent dehydration and helps loosen up mucus. General instructions  Rest as much as possible. Do not drink alcohol. Do not use any products that contain nicotine or tobacco. These products include cigarettes, chewing tobacco, and vaping devices, such as e-cigarettes. If you need help quitting, ask your health care provider. Keep all follow-up visits. This is important. How is this prevented?     Get an annual flu shot. You may get the flu shot in late summer, fall, or winter. Ask your health care provider when you should get your flu shot. Avoid spreading your infection to other people. If you are sick: Wash your hands with soap and water often, especially after you cough or sneeze. Wash for at least 20 seconds. If soap and water are not available, use alcohol-based hand sanitizer. Cover your mouth when you cough. Cover your nose and mouth when you sneeze. Do not share cups or eating utensils. Clean commonly used objects often. Clean commonly touched surfaces. Stay home from work or school as told by your health care provider. Avoid contact with people who are sick during cold and flu season. This is generally fall and winter. Contact a health care provider if: Your symptoms last for 10 days or longer. Your symptoms get worse over time. You have severe sinus pain in your face or forehead. The glands in your jaw or neck become very swollen. You have shortness of breath. Get   help right away if you: Feel pain or pressure in your chest. Have trouble breathing. Faint or feel like you will faint. Have severe and persistent vomiting. Feel confused or disoriented. These symptoms may represent a serious problem that is an emergency. Do not wait to see if the symptoms will  go away. Get medical help right away. Call your local emergency services (911 in the [place] Do not drive yourself to the hospital. Summary A respiratory infection is an illness that affects part of the respiratory system, such as the lungs, nose, or throat. A respiratory infection that is caused by a virus is called a viral respiratory infection. Common types of viral respiratory infections include a cold, influenza, and respiratory syncytial virus (RSV) infection. Symptoms of this condition include a stuffy or runny nose, cough, fatigue, achy muscles, sore throat, and fevers or chills. Antibiotic medicines are not prescribed for viral infections. This is because antibiotics are designed to kill bacteria. They are not effective against viruses. This information is not intended to replace advice given to you by your health care provider. Make sure you discuss any questions you have with your health care provider. Document Revised: 06/23/2020 Document Reviewed: 06/23/2020 Elsevier Patient Education  2023 Elsevier Inc.  

## 2022-02-01 LAB — VERITOR FLU A/B WAIVED
Influenza A: NEGATIVE
Influenza B: NEGATIVE

## 2022-02-02 LAB — COVID-19, FLU A+B AND RSV
Influenza A, NAA: NOT DETECTED
Influenza B, NAA: NOT DETECTED
RSV, NAA: NOT DETECTED
SARS-CoV-2, NAA: NOT DETECTED

## 2022-02-12 ENCOUNTER — Telehealth: Payer: Self-pay

## 2022-02-12 NOTE — Telephone Encounter (Signed)
Stephanie Brewer ([name]) Rx #: [id number] EHOZYY 2.'4MG'$ /0.75ML auto-injectors   Form [employer] Electronic Request Form (CB) Created 5 hours ago Sent to Plan 3 minutes ago Plan Response 3 minutes ago Submit Clinical Questions less than a minute ago Determination Wait for Determination Please wait for [employer] to return a determination

## 2022-02-14 ENCOUNTER — Telehealth: Payer: Self-pay

## 2022-02-14 NOTE — Telephone Encounter (Signed)
Stephanie Brewer ([name]) Rx #: [id number] FXOVAN 2.'4MG'$ /0.75ML auto-injectors   Form [employer] [place] [employer] Electronic Request Form (CB) Created 2 days ago Sent to Plan 1 day ago Plan Response 1 day ago Submit Clinical Questions 1 day ago Determination Favorable 19 hours ago Your prior authorization for [name] has been approved! MORE INFO Personalized support and financial assistance may be available through the [employer] program. For more information, and to see program requirements, click on the More Info button to the right.  Message from plan: Effective from 02/12/2022 through 02/11/2023.

## 2022-03-15 ENCOUNTER — Other Ambulatory Visit: Payer: Self-pay | Admitting: Hematology and Oncology

## 2022-03-15 NOTE — Telephone Encounter (Signed)
[name], do you happen to know if she is still supposed to be taking this?  I can't tell by your last note and Dr. Blenda Mounts last note doesn't address it either, he just stated she would continue to f/u with you.

## 2022-03-20 ENCOUNTER — Encounter (INDEPENDENT_AMBULATORY_CARE_PROVIDER_SITE_OTHER): Payer: BC Managed Care – PPO | Admitting: Family Medicine

## 2022-03-20 DIAGNOSIS — H1033 Unspecified acute conjunctivitis, bilateral: Secondary | ICD-10-CM

## 2022-03-20 MED ORDER — POLYMYXIN B-TRIMETHOPRIM 10000-0.1 UNIT/ML-% OP SOLN: 1.0000 [drp] | Freq: Four times a day (QID) | OPHTHALMIC | 0 refills | Status: AC

## 2022-03-20 NOTE — Telephone Encounter (Signed)

## 2022-03-28 ENCOUNTER — Encounter: Payer: Self-pay | Admitting: Nurse Practitioner

## 2022-03-28 ENCOUNTER — Telehealth (INDEPENDENT_AMBULATORY_CARE_PROVIDER_SITE_OTHER): Payer: BC Managed Care – PPO | Admitting: Nurse Practitioner

## 2022-03-28 DIAGNOSIS — H9203 Otalgia, bilateral: Secondary | ICD-10-CM

## 2022-03-28 DIAGNOSIS — J069 Acute upper respiratory infection, unspecified: Secondary | ICD-10-CM

## 2022-03-28 MED ORDER — AMOXICILLIN-POT CLAVULANATE 875-125 MG PO TABS: 1.0000 | ORAL_TABLET | Freq: Two times a day (BID) | ORAL | 0 refills | Status: DC

## 2022-03-28 NOTE — Patient Instructions (Signed)
Earache, Adult An earache, or ear pain, can be caused by many things, including: An infection. Ear wax buildup. Ear pressure. Something in the ear that should not be there (foreign body). A sore throat. Tooth problems. Jaw problems. Treatment of the earache will depend on the cause. If the cause is not clear or cannot be known, you may need to watch your symptoms until your earache goes away or until a cause is found. Follow these instructions at home: Medicines Take or apply over-the-counter and prescription medicines only as told by your health care provider. If you were prescribed antibiotics, use them as told by your health care provider. Do not stop using the antibiotic even if you start to feel better. Do not put anything in your ear other than medicine that is prescribed by your health care provider. Managing pain     If directed, apply heat to the affected area as often as told by your health care provider. Use the heat source that your health care provider recommends, such as a moist heat pack or a heating pad. Place a towel between your skin and the heat source. Leave the heat on for 20-30 minutes. If your skin turns bright red, remove the heat right away to prevent burns. The risk of burns is higher if you cannot feel pain, heat, or cold. If directed, put ice on the affected area. To do this: Put ice in a plastic bag. Place a towel between your skin and the bag. Leave the ice on for 20 minutes, 2-3 times a day. If your skin turns bright red, remove the ice right away to prevent skin damage. The risk of skin damage is higher if you cannot feel pain, heat, or cold.  General instructions Pay attention to any changes in your symptoms. Try resting in an upright position instead of lying down. This may help to reduce pressure in your ear and relieve pain. Chew gum if it helps to relieve your ear pain. Treat any allergies as told by your health care provider. Drink enough fluid  to keep your urine pale yellow. It is up to you to get the results of any tests that were done. Ask your health care provider, or the department that is doing the tests, when your results will be ready. Contact a health care provider if: Your pain does not improve within 2 days. Your earache gets worse. You have new symptoms. You have a fever. Get help right away if: You have a severe headache. You have a stiff neck. You have trouble swallowing. You have redness or swelling behind your ear. You have fluid or blood coming from your ear. You have hearing loss. You feel dizzy. This information is not intended to replace advice given to you by your health care provider. Make sure you discuss any questions you have with your health care provider. Document Revised: 07/31/2021 Document Reviewed: 07/31/2021 Elsevier Patient Education  [place].

## 2022-03-28 NOTE — Progress Notes (Signed)
   Virtual Visit  Note Due to COVID-19 pandemic this visit was conducted virtually. This visit type was conducted due to national recommendations for restrictions regarding the COVID-19 Pandemic (e.g. social distancing, sheltering in place) in an effort to limit this patient's exposure and mitigate transmission in our community. All issues noted in this document were discussed and addressed.  A physical exam was not performed with this format.  I connected with Stephanie Brewer on 03/28/22 at 8:40 am by telephone and verified that I am speaking with the correct person using two identifiers. Stephanie Brewer is currently located at home during visit. The provider, Ivy Lynn, NP is located in their office at time of visit.  I discussed the limitations, risks, security and privacy concerns of performing an evaluation and management service by telephone and the availability of in person appointments. I also discussed with the patient that there may be a patient responsible charge related to this service. The patient expressed understanding and agreed to proceed.   History and Present Illness:  HPI    Review of Systems  Constitutional: Negative.   HENT:  Positive for congestion, ear discharge and ear pain.   Eyes: Negative.   Respiratory:  Positive for cough.   Cardiovascular: Negative.   Genitourinary: Negative.   Skin: Negative.  Negative for itching and rash.  All other systems reviewed and are negative.    Observations/Objective: Tele-visit patient is not in distress  Assessment and Plan: Patient presents with unresolved URI and new onset bilateral ear pain with congestion. Take meds as prescribed - Use a cool mist humidifier  -at home COVID 19 test negative -Use saline nose sprays frequently -Force fluids -For fever or aches or pains- take Tylenol or ibuprofen. -If symptoms do not improve, she may need to be COVID tested to rule this out Follow up with worsening unresolved  symptoms   Follow Up Instructions: Follow up with unresolved symptoms    I discussed the assessment and treatment plan with the patient. The patient was provided an opportunity to ask questions and all were answered. The patient agreed with the plan and demonstrated an understanding of the instructions.   The patient was advised to call back or seek an in-person evaluation if the symptoms worsen or if the condition fails to improve as anticipated.  The above assessment and management plan was discussed with the patient. The patient verbalized understanding of and has agreed to the management plan. Patient is aware to call the clinic if symptoms persist or worsen. Patient is aware when to return to the clinic for a follow-up visit. Patient educated on when it is appropriate to go to the emergency department.   Time call ended:  8:41 am   I provided 11 minutes of  non face-to-face time during this encounter.    Ivy Lynn, NP

## 2022-03-29 ENCOUNTER — Ambulatory Visit: Payer: BC Managed Care – PPO

## 2022-04-02 ENCOUNTER — Other Ambulatory Visit: Payer: Self-pay

## 2022-04-02 ENCOUNTER — Emergency Department (HOSPITAL_BASED_OUTPATIENT_CLINIC_OR_DEPARTMENT_OTHER)
Admission: EM | Admit: 2022-04-02 | Discharge: 2022-04-02 | Disposition: A | Discharge status: Home / Self Care | Payer: BC Managed Care – PPO | Attending: Emergency Medicine | Admitting: Emergency Medicine

## 2022-04-02 ENCOUNTER — Emergency Department (HOSPITAL_BASED_OUTPATIENT_CLINIC_OR_DEPARTMENT_OTHER): Payer: BC Managed Care – PPO

## 2022-04-02 ENCOUNTER — Encounter (HOSPITAL_BASED_OUTPATIENT_CLINIC_OR_DEPARTMENT_OTHER): Payer: Self-pay | Admitting: Emergency Medicine

## 2022-04-02 DIAGNOSIS — K6389 Other specified diseases of intestine: Secondary | ICD-10-CM | POA: Diagnosis not present

## 2022-04-02 DIAGNOSIS — K5792 Diverticulitis of intestine, part unspecified, without perforation or abscess without bleeding: Secondary | ICD-10-CM | POA: Diagnosis not present

## 2022-04-02 DIAGNOSIS — N281 Cyst of kidney, acquired: Secondary | ICD-10-CM | POA: Diagnosis not present

## 2022-04-02 DIAGNOSIS — R1032 Left lower quadrant pain: Secondary | ICD-10-CM | POA: Diagnosis not present

## 2022-04-02 LAB — CBC
HCT: 39.5 % (ref 36.0–46.0)
Hemoglobin: 13.3 g/dL (ref 12.0–15.0)
MCH: 29.5 pg (ref 26.0–34.0)
MCHC: 33.7 g/dL (ref 30.0–36.0)
MCV: 87.6 fL (ref 80.0–100.0)
Platelets: 300 10*3/uL (ref 150–400)
RBC: 4.51 MIL/uL (ref 3.87–5.11)
RDW: 13.2 % (ref 11.5–15.5)
WBC: 5.6 10*3/uL (ref 4.0–10.5)
nRBC: 0 % (ref 0.0–0.2)

## 2022-04-02 LAB — URINALYSIS, ROUTINE W REFLEX MICROSCOPIC
Bacteria, UA: NONE SEEN
Bilirubin Urine: NEGATIVE
Glucose, UA: NEGATIVE mg/dL
Ketones, ur: 40 mg/dL — AB
Nitrite: NEGATIVE
Protein, ur: 30 mg/dL — AB
Specific Gravity, Urine: 1.036 — ABNORMAL HIGH (ref 1.005–1.030)
WBC, UA: >50 WBC/hpf — ABNORMAL HIGH (ref 0–5)
pH: 5.5 (ref 5.0–8.0)

## 2022-04-02 LAB — COMPREHENSIVE METABOLIC PANEL
ALT: 6 U/L (ref 0–44)
AST: 11 U/L — ABNORMAL LOW (ref 15–41)
Albumin: 4.1 g/dL (ref 3.5–5.0)
Alkaline Phosphatase: 71 U/L (ref 38–126)
Anion gap: 12 (ref 5–15)
BUN: 10 mg/dL (ref 6–20)
CO2: 29 mmol/L (ref 22–32)
Calcium: 9.8 mg/dL (ref 8.9–10.3)
Chloride: 97 mmol/L — ABNORMAL LOW (ref 98–111)
Creatinine, Ser: 0.58 mg/dL (ref 0.44–1.00)
GFR, Estimated: >60 mL/min (ref >60)
Glucose, Bld: 118 mg/dL — ABNORMAL HIGH (ref 70–99)
Potassium: 3.7 mmol/L (ref 3.5–5.1)
Sodium: 138 mmol/L (ref 135–145)
Total Bilirubin: 0.5 mg/dL (ref 0.3–1.2)
Total Protein: 7.8 g/dL (ref 6.5–8.1)

## 2022-04-02 LAB — LIPASE, BLOOD: Lipase: 31 U/L (ref 11–51)

## 2022-04-02 LAB — PREGNANCY, URINE: Preg Test, Ur: NEGATIVE

## 2022-04-02 MED ORDER — CIPROFLOXACIN HCL 500 MG PO TABS: 500.0000 mg | ORAL_TABLET | Freq: Two times a day (BID) | ORAL | 0 refills | Status: DC

## 2022-04-02 MED ORDER — LACTATED RINGERS IV BOLUS
1000.0000 mL | Freq: Once | INTRAVENOUS | Status: AC
  Administered 2022-04-02: 1000 mL via INTRAVENOUS

## 2022-04-02 MED ORDER — IOHEXOL 300 MG/ML  SOLN
100.0000 mL | Freq: Once | INTRAMUSCULAR | Status: AC | PRN
  Administered 2022-04-02: 80 mL via INTRAVENOUS

## 2022-04-02 MED ORDER — METRONIDAZOLE 500 MG PO TABS: 500.0000 mg | ORAL_TABLET | Freq: Two times a day (BID) | ORAL | 0 refills | Status: DC

## 2022-04-02 MED ORDER — CIPROFLOXACIN HCL 500 MG PO TABS
500.0000 mg | ORAL_TABLET | Freq: Once | ORAL | Status: AC
  Administered 2022-04-02: 500 mg via ORAL
  Filled 2022-04-02: qty 1

## 2022-04-02 MED ORDER — METRONIDAZOLE 500 MG PO TABS
500.0000 mg | ORAL_TABLET | Freq: Once | ORAL | Status: AC
  Administered 2022-04-02: 500 mg via ORAL
  Filled 2022-04-02: qty 1

## 2022-04-02 MED ORDER — ONDANSETRON HCL 4 MG PO TABS: 4.0000 mg | ORAL_TABLET | Freq: Four times a day (QID) | ORAL | 0 refills | Status: DC | PRN

## 2022-04-02 MED ORDER — ONDANSETRON HCL 4 MG PO TABS: 4.0000 mg | ORAL_TABLET | Freq: Four times a day (QID) | ORAL | 0 refills | Status: DC

## 2022-04-02 MED ORDER — ONDANSETRON HCL 4 MG/2ML IJ SOLN
4.0000 mg | Freq: Once | INTRAMUSCULAR | Status: AC
  Administered 2022-04-02: 4 mg via INTRAVENOUS
  Filled 2022-04-02: qty 2

## 2022-04-02 NOTE — ED Provider Notes (Signed)
[facility] DEPT Provider Note   CSN: [id number] Arrival date & time: 04/02/22  1554     History Chief Complaint  Patient presents with   Abdominal Pain    HPI Stephanie Brewer is a 57 y.o. female presenting for fever malaise, nausea without vomiting.  Endorses some diarrhea.  Endorses left lower quadrant pain persistent.  Fevers of 101 at home.  She is on day 5 of Augmentin at home for an upper respiratory infection.  Patient's recorded medical, surgical, social, medication list and allergies were reviewed in the Snapshot window as part of the initial history.   Review of Systems   Review of Systems  Constitutional:  Positive for fever. Negative for chills.  HENT:  Negative for ear pain and sore throat.   Eyes:  Negative for pain and visual disturbance.  Respiratory:  Positive for shortness of breath. Negative for cough.   Cardiovascular:  Negative for chest pain and palpitations.  Gastrointestinal:  Negative for abdominal pain and vomiting.  Genitourinary:  Positive for dysuria. Negative for hematuria.  Musculoskeletal:  Negative for arthralgias and back pain.  Skin:  Negative for color change and rash.  Neurological:  Negative for seizures and syncope.  All other systems reviewed and are negative.   Physical Exam Updated Vital Signs BP 117/77 (BP Location: Right Arm)   Pulse 70   Temp 98.6 F (37 C)   Resp 16   SpO2 99%  Physical Exam Vitals and nursing note reviewed.  Constitutional:      General: She is not in acute distress.    Appearance: She is well-developed.  HENT:     Head: Normocephalic and atraumatic.  Eyes:     Conjunctiva/sclera: Conjunctivae normal.  Cardiovascular:     Rate and Rhythm: Normal rate and regular rhythm.     Heart sounds: No murmur heard. Pulmonary:     Effort: Pulmonary effort is normal. No respiratory distress.     Breath sounds: Normal breath sounds.  Abdominal:     Palpations: Abdomen is soft.      Tenderness: There is abdominal tenderness in the left lower quadrant.  Musculoskeletal:        General: No swelling.     Cervical back: Neck supple.  Skin:    General: Skin is warm and dry.     Capillary Refill: Capillary refill takes less than 2 seconds.  Neurological:     Mental Status: She is alert.  Psychiatric:        Mood and Affect: Mood normal.      ED Course/ Medical Decision Making/ A&P Clinical Course as of 04/02/22 2201  Mon Apr 02, 2022  2005 Diverticulitis  [CC]    Clinical Course User Index [CC] Tretha Sciara, MD    Procedures Procedures   Medications Ordered in ED Medications  lactated ringers bolus 1,000 mL (1,000 mLs Intravenous New Bag/Given 04/02/22 1928)  ondansetron (ZOFRAN) injection 4 mg (4 mg Intravenous Given 04/02/22 1926)  iohexol (OMNIPAQUE) 300 MG/ML solution 100 mL (80 mLs Intravenous Contrast Given 04/02/22 1942)  ciprofloxacin (CIPRO) tablet 500 mg (500 mg Oral Given 04/02/22 2102)  metroNIDAZOLE (FLAGYL) tablet 500 mg (500 mg Oral Given 04/02/22 2102)   Medical Decision Making:   Stephanie Brewer is a 57 y.o. female who presented to the ED today with abdominal pain, detailed above.    Patient's presentation is complicated by their history of diverticulitis.  Patient placed on continuous vitals and telemetry monitoring while in ED  which was reviewed periodically.  Complete initial physical exam performed, notably the patient  was HDS in NAD.     Reviewed and confirmed nursing documentation for past medical history, family history, social history.    Initial Assessment:   With the patient's presentation of abdominal pain, most likely diagnosis is MSK vs diverticulitis. Other diagnoses were considered including (but not limited to) gastroenteritis, colitis, small bowel obstruction, appendicitis, cholecystitis, pancreatitis, nephrolithiasis, UTI, pyleonephritis, ovarian torsion. These are considered less likely due to history of present illness and  physical exam findings.   This is most consistent with an acute life/limb threatening illness complicated by underlying chronic conditions.   Initial Plan:  CBC/CMP to evaluate for underlying infectious/metabolic etiology for patient's abdominal pain  Lipase to evaluate for pancreatitis  CTAB/Pelvis with contrast to evaluate for structural/surgical etiology of patients' severe abdominal pain.  Urinalysis and repeat physical assessment to evaluate for UTI/Pyelonpehritis  Empiric management of symptoms with escalating pain control and antiemetics as needed.   Initial Study Results:   Laboratory  All laboratory results reviewed without evidence of clinically relevant pathology.   EKG EKG was reviewed independently. Rate, rhythm, axis, intervals all examined and without medically relevant abnormality. ST segments without concerns for elevations.    Radiology All images reviewed independently. Agree with radiology report at this time.   CT ABDOMEN PELVIS W CONTRAST  Result Date: 04/02/2022 CLINICAL DATA:  Generalized abdominal pain for several days, initial encounter EXAM: CT ABDOMEN AND PELVIS WITH CONTRAST TECHNIQUE: Multidetector CT imaging of the abdomen and pelvis was performed using the standard protocol following bolus administration of intravenous contrast. RADIATION DOSE REDUCTION: This exam was performed according to the departmental dose-optimization program which includes automated exposure control, adjustment of the mA and/or kV according to patient size and/or use of iterative reconstruction technique. CONTRAST:  3m OMNIPAQUE IOHEXOL 300 MG/ML  SOLN COMPARISON:  12/26/2021 FINDINGS: Lower chest: No acute abnormality. Hepatobiliary: No focal liver abnormality is seen. No gallstones, gallbladder wall thickening, or biliary dilatation. Stable cyst is noted in the dome of the liver with mild calcification unchanged from the prior exam. Pancreas: Unremarkable. No pancreatic ductal dilatation  or surrounding inflammatory changes. Spleen: Normal in size without focal abnormality. Adrenals/Urinary Tract: Adrenal glands are within normal limits. Kidneys are well visualize within normal enhancement pattern. Scattered cysts are noted bilaterally. These appear simple in nature. The largest of these lies in the right kidney measuring 4.2 cm. No follow-up is recommended. No obstructive changes are seen. The bladder is decompressed. Stomach/Bowel: Considerable wall thickening and pericolonic inflammatory changes are noted in the sigmoid colon consistent with diverticulitis. No perforation or abscess formation is noted at this time inflammatory change surrounds the left ovary. The more proximal colon shows no acute abnormality. The appendix is within normal limits. The stomach and small bowel are within normal limits. Vascular/Lymphatic: No significant vascular findings are present. No enlarged abdominal or pelvic lymph nodes. Reproductive: Uterus is within normal limits. The left ovary is involved in the pericolonic inflammatory change in the left hemipelvis. No adnexal mass is seen. Other: No abdominal wall hernia or abnormality. No abdominopelvic ascites. Musculoskeletal: No acute or significant osseous findings. IMPRESSION: Changes consistent with sigmoid diverticulitis. The inflammatory change surrounds the left adnexa. No abscess or perforation is noted. No other focal abnormality is noted. Electronically Signed   By: MInez CatalinaM.D.   On: 04/02/2022 19:59    Final Reassessment and Plan:   Patient's objective findings are most consistent with diverticulitis.  His symptoms grossly improved after IV fluids and nausea administration.  She is stable for outpatient care management at this time without any acute further indication given no complication, well appearance, restoration of normal function.  Will treat with Cipro Flagyl since patient has had benefit with this combination in the  past.   Disposition:  I have considered need for hospitalization, however, considering all of the above, I believe this patient is stable for discharge at this time.  Patient/family educated about specific return precautions for given chief complaint and symptoms.  Patient/family educated about follow-up with PCP .     Patient/family expressed understanding of return precautions and need for follow-up. Patient spoken to regarding all imaging and laboratory results and appropriate follow up for these results. All education provided in verbal form with additional information in written form. Time was allowed for answering of patient questions. Patient discharged.    Emergency Department Medication Summary:   Medications  lactated ringers bolus 1,000 mL (1,000 mLs Intravenous New Bag/Given 04/02/22 1928)  ondansetron (ZOFRAN) injection 4 mg (4 mg Intravenous Given 04/02/22 1926)  iohexol (OMNIPAQUE) 300 MG/ML solution 100 mL (80 mLs Intravenous Contrast Given 04/02/22 1942)  ciprofloxacin (CIPRO) tablet 500 mg (500 mg Oral Given 04/02/22 2102)  metroNIDAZOLE (FLAGYL) tablet 500 mg (500 mg Oral Given 04/02/22 2102)         Clinical Impression:  1. Diverticulitis      Discharge   Final Clinical Impression(s) / ED Diagnoses Final diagnoses:  Diverticulitis    Rx / DC Orders ED Discharge Orders          Ordered    ciprofloxacin (CIPRO) 500 MG tablet  Every 12 hours        04/02/22 2201    metroNIDAZOLE (FLAGYL) 500 MG tablet  2 times daily        04/02/22 2201              Tretha Sciara, MD 04/02/22 2202

## 2022-04-02 NOTE — ED Triage Notes (Signed)
Abdominal pain nausea  Started augment on Wednesday.

## 2022-04-17 ENCOUNTER — Encounter: Payer: BC Managed Care – PPO | Admitting: Family Medicine

## 2022-05-23 ENCOUNTER — Emergency Department (HOSPITAL_BASED_OUTPATIENT_CLINIC_OR_DEPARTMENT_OTHER): Payer: BC Managed Care – PPO

## 2022-05-23 ENCOUNTER — Ambulatory Visit (INDEPENDENT_AMBULATORY_CARE_PROVIDER_SITE_OTHER): Payer: BC Managed Care – PPO | Admitting: Family Medicine

## 2022-05-23 ENCOUNTER — Encounter (HOSPITAL_BASED_OUTPATIENT_CLINIC_OR_DEPARTMENT_OTHER): Payer: Self-pay | Admitting: Emergency Medicine

## 2022-05-23 ENCOUNTER — Encounter: Payer: Self-pay | Admitting: Family Medicine

## 2022-05-23 ENCOUNTER — Other Ambulatory Visit: Payer: Self-pay

## 2022-05-23 ENCOUNTER — Inpatient Hospital Stay (HOSPITAL_BASED_OUTPATIENT_CLINIC_OR_DEPARTMENT_OTHER)
Admission: EM | Admit: 2022-05-23 | Discharge: 2022-05-28 | DRG: 392 | Disposition: A | Discharge status: Home / Self Care | Payer: BC Managed Care – PPO | Attending: Family Medicine | Admitting: Family Medicine

## 2022-05-23 VITALS — BP 102/74 | HR 100 | Temp 98.6°F | Ht 64.0 in | Wt 123.0 lb

## 2022-05-23 DIAGNOSIS — K219 Gastro-esophageal reflux disease without esophagitis: Secondary | ICD-10-CM | POA: Diagnosis present

## 2022-05-23 DIAGNOSIS — Z8249 Family history of ischemic heart disease and other diseases of the circulatory system: Secondary | ICD-10-CM | POA: Diagnosis not present

## 2022-05-23 DIAGNOSIS — R7303 Prediabetes: Secondary | ICD-10-CM | POA: Diagnosis not present

## 2022-05-23 DIAGNOSIS — Z9882 Breast implant status: Secondary | ICD-10-CM | POA: Diagnosis not present

## 2022-05-23 DIAGNOSIS — Z170 Estrogen receptor positive status [ER+]: Secondary | ICD-10-CM

## 2022-05-23 DIAGNOSIS — Z86718 Personal history of other venous thrombosis and embolism: Secondary | ICD-10-CM

## 2022-05-23 DIAGNOSIS — M810 Age-related osteoporosis without current pathological fracture: Secondary | ICD-10-CM

## 2022-05-23 DIAGNOSIS — Z0001 Encounter for general adult medical examination with abnormal findings: Secondary | ICD-10-CM

## 2022-05-23 DIAGNOSIS — Z79811 Long term (current) use of aromatase inhibitors: Secondary | ICD-10-CM | POA: Diagnosis not present

## 2022-05-23 DIAGNOSIS — K5792 Diverticulitis of intestine, part unspecified, without perforation or abscess without bleeding: Secondary | ICD-10-CM | POA: Diagnosis present

## 2022-05-23 DIAGNOSIS — E7800 Pure hypercholesterolemia, unspecified: Secondary | ICD-10-CM

## 2022-05-23 DIAGNOSIS — D630 Anemia in neoplastic disease: Secondary | ICD-10-CM | POA: Diagnosis present

## 2022-05-23 DIAGNOSIS — D649 Anemia, unspecified: Secondary | ICD-10-CM | POA: Diagnosis not present

## 2022-05-23 DIAGNOSIS — K5720 Diverticulitis of large intestine with perforation and abscess without bleeding: Principal | ICD-10-CM | POA: Diagnosis present

## 2022-05-23 DIAGNOSIS — Z7901 Long term (current) use of anticoagulants: Secondary | ICD-10-CM | POA: Diagnosis not present

## 2022-05-23 DIAGNOSIS — N281 Cyst of kidney, acquired: Secondary | ICD-10-CM | POA: Diagnosis not present

## 2022-05-23 DIAGNOSIS — C50411 Malignant neoplasm of upper-outer quadrant of right female breast: Secondary | ICD-10-CM | POA: Diagnosis present

## 2022-05-23 DIAGNOSIS — R1032 Left lower quadrant pain: Secondary | ICD-10-CM | POA: Diagnosis not present

## 2022-05-23 DIAGNOSIS — Z0000 Encounter for general adult medical examination without abnormal findings: Secondary | ICD-10-CM

## 2022-05-23 DIAGNOSIS — K5732 Diverticulitis of large intestine without perforation or abscess without bleeding: Secondary | ICD-10-CM | POA: Diagnosis not present

## 2022-05-23 DIAGNOSIS — E876 Hypokalemia: Secondary | ICD-10-CM | POA: Diagnosis present

## 2022-05-23 DIAGNOSIS — R110 Nausea: Secondary | ICD-10-CM | POA: Diagnosis present

## 2022-05-23 DIAGNOSIS — R000 Tachycardia, unspecified: Secondary | ICD-10-CM | POA: Diagnosis not present

## 2022-05-23 DIAGNOSIS — E663 Overweight: Secondary | ICD-10-CM

## 2022-05-23 LAB — URINALYSIS, ROUTINE W REFLEX MICROSCOPIC
Bacteria, UA: NONE SEEN
Glucose, UA: NEGATIVE mg/dL
Ketones, ur: >80 mg/dL — AB
Nitrite: NEGATIVE
Protein, ur: 30 mg/dL — AB
Specific Gravity, Urine: 1.035 — ABNORMAL HIGH (ref 1.005–1.030)
pH: 6 (ref 5.0–8.0)

## 2022-05-23 LAB — COMPREHENSIVE METABOLIC PANEL
ALT: 5 U/L (ref 0–44)
AST: 9 U/L — ABNORMAL LOW (ref 15–41)
Albumin: 4.3 g/dL (ref 3.5–5.0)
Alkaline Phosphatase: 66 U/L (ref 38–126)
Anion gap: 14 (ref 5–15)
BUN: 12 mg/dL (ref 6–20)
CO2: 24 mmol/L (ref 22–32)
Calcium: 9.6 mg/dL (ref 8.9–10.3)
Chloride: 99 mmol/L (ref 98–111)
Creatinine, Ser: 0.72 mg/dL (ref 0.44–1.00)
GFR, Estimated: >60 mL/min (ref >60)
Glucose, Bld: 90 mg/dL (ref 70–99)
Potassium: 3.9 mmol/L (ref 3.5–5.1)
Sodium: 137 mmol/L (ref 135–145)
Total Bilirubin: 0.9 mg/dL (ref 0.3–1.2)
Total Protein: 7.6 g/dL (ref 6.5–8.1)

## 2022-05-23 LAB — CBC WITH DIFFERENTIAL/PLATELET
Abs Immature Granulocytes: 0.04 10*3/uL (ref 0.00–0.07)
Basophils Absolute: 0 10*3/uL (ref 0.0–0.1)
Basophils Relative: 0 %
Eosinophils Absolute: 0 10*3/uL (ref 0.0–0.5)
Eosinophils Relative: 0 %
HCT: 39.1 % (ref 36.0–46.0)
Hemoglobin: 13.3 g/dL (ref 12.0–15.0)
Immature Granulocytes: 0 %
Lymphocytes Relative: 7 %
Lymphs Abs: 0.7 10*3/uL (ref 0.7–4.0)
MCH: 29.6 pg (ref 26.0–34.0)
MCHC: 34 g/dL (ref 30.0–36.0)
MCV: 86.9 fL (ref 80.0–100.0)
Monocytes Absolute: 1 10*3/uL (ref 0.1–1.0)
Monocytes Relative: 10 %
Neutro Abs: 8 10*3/uL — ABNORMAL HIGH (ref 1.7–7.7)
Neutrophils Relative %: 83 %
Platelets: 202 10*3/uL (ref 150–400)
RBC: 4.5 MIL/uL (ref 3.87–5.11)
RDW: 13.3 % (ref 11.5–15.5)
WBC: 9.7 10*3/uL (ref 4.0–10.5)
nRBC: 0 % (ref 0.0–0.2)

## 2022-05-23 LAB — GLUCOSE, CAPILLARY: Glucose-Capillary: 112 mg/dL — ABNORMAL HIGH (ref 70–99)

## 2022-05-23 LAB — HEPARIN LEVEL (UNFRACTIONATED): Heparin Unfractionated: <0.1 IU/mL — ABNORMAL LOW (ref 0.30–0.70)

## 2022-05-23 LAB — APTT: aPTT: 33 seconds (ref 24–36)

## 2022-05-23 LAB — LIPASE, BLOOD: Lipase: 13 U/L (ref 11–51)

## 2022-05-23 LAB — LACTIC ACID, PLASMA: Lactic Acid, Venous: 0.8 mmol/L (ref 0.5–1.9)

## 2022-05-23 LAB — BAYER DCA HB A1C WAIVED: HB A1C (BAYER DCA - WAIVED): 6.3 % — ABNORMAL HIGH (ref 4.8–5.6)

## 2022-05-23 MED ORDER — METOPROLOL TARTRATE 5 MG/5ML IV SOLN: 5.0000 mg | INTRAVENOUS | Status: DC | PRN

## 2022-05-23 MED ORDER — SODIUM CHLORIDE 0.9 % IV BOLUS
1000.0000 mL | Freq: Once | INTRAVENOUS | Status: AC
  Administered 2022-05-23: 1000 mL via INTRAVENOUS

## 2022-05-23 MED ORDER — ENOXAPARIN SODIUM 40 MG/0.4ML IJ SOSY: 40.0000 mg | PREFILLED_SYRINGE | INTRAMUSCULAR | Status: DC

## 2022-05-23 MED ORDER — IOHEXOL 300 MG/ML  SOLN
100.0000 mL | Freq: Once | INTRAMUSCULAR | Status: AC | PRN
  Administered 2022-05-23: 70 mL via INTRAVENOUS

## 2022-05-23 MED ORDER — ONDANSETRON HCL 4 MG/2ML IJ SOLN
4.0000 mg | Freq: Once | INTRAMUSCULAR | Status: AC
  Administered 2022-05-23: 4 mg via INTRAVENOUS
  Filled 2022-05-23: qty 2

## 2022-05-23 MED ORDER — IPRATROPIUM-ALBUTEROL 0.5-2.5 (3) MG/3ML IN SOLN: 3.0000 mL | RESPIRATORY_TRACT | Status: DC | PRN

## 2022-05-23 MED ORDER — HEPARIN (PORCINE) 25000 UT/250ML-% IV SOLN
1100.0000 [IU]/h | INTRAVENOUS | Status: DC
  Administered 2022-05-23: 800 [IU]/h via INTRAVENOUS
  Administered 2022-05-24: 1150 [IU]/h via INTRAVENOUS
  Administered 2022-05-25: 1050 [IU]/h via INTRAVENOUS
  Administered 2022-05-26: 1100 [IU]/h via INTRAVENOUS
  Filled 2022-05-23 (×4): qty 250

## 2022-05-23 MED ORDER — HEPARIN BOLUS VIA INFUSION
2000.0000 [IU] | Freq: Once | INTRAVENOUS | Status: AC
  Administered 2022-05-23: 2000 [IU] via INTRAVENOUS
  Filled 2022-05-23: qty 2000

## 2022-05-23 MED ORDER — DEXTROSE-NACL 5-0.45 % IV SOLN
INTRAVENOUS | Status: DC
  Administered 2022-05-24: 75 mL/h via INTRAVENOUS

## 2022-05-23 MED ORDER — HYDROMORPHONE HCL 1 MG/ML IJ SOLN
0.5000 mg | INTRAMUSCULAR | Status: DC | PRN
  Filled 2022-05-23: qty 0.5

## 2022-05-23 MED ORDER — ACETAMINOPHEN 325 MG PO TABS
650.0000 mg | ORAL_TABLET | Freq: Four times a day (QID) | ORAL | Status: DC | PRN
  Administered 2022-05-24 (×2): 650 mg via ORAL
  Filled 2022-05-23 (×3): qty 2

## 2022-05-23 MED ORDER — GUAIFENESIN 100 MG/5ML PO LIQD: 5.0000 mL | ORAL | Status: DC | PRN

## 2022-05-23 MED ORDER — PIPERACILLIN-TAZOBACTAM 3.375 G IVPB
3.3750 g | Freq: Three times a day (TID) | INTRAVENOUS | Status: DC
  Administered 2022-05-23 – 2022-05-28 (×14): 3.375 g via INTRAVENOUS
  Filled 2022-05-23 (×15): qty 50

## 2022-05-23 MED ORDER — ACETAMINOPHEN 325 MG PO TABS
650.0000 mg | ORAL_TABLET | Freq: Once | ORAL | Status: AC
  Administered 2022-05-23: 650 mg via ORAL
  Filled 2022-05-23: qty 2

## 2022-05-23 MED ORDER — PIPERACILLIN-TAZOBACTAM 3.375 G IVPB 30 MIN
3.3750 g | Freq: Once | INTRAVENOUS | Status: AC
  Administered 2022-05-23: 3.375 g via INTRAVENOUS
  Filled 2022-05-23: qty 50

## 2022-05-23 MED ORDER — HYDRALAZINE HCL 20 MG/ML IJ SOLN: 10.0000 mg | INTRAMUSCULAR | Status: DC | PRN

## 2022-05-23 MED ORDER — TRAZODONE HCL 50 MG PO TABS: 50.0000 mg | ORAL_TABLET | Freq: Every evening | ORAL | Status: DC | PRN

## 2022-05-23 MED ORDER — ONDANSETRON HCL 4 MG/2ML IJ SOLN
4.0000 mg | Freq: Four times a day (QID) | INTRAMUSCULAR | Status: DC | PRN
  Administered 2022-05-24: 4 mg via INTRAVENOUS
  Filled 2022-05-23: qty 2

## 2022-05-23 MED ORDER — SENNOSIDES-DOCUSATE SODIUM 8.6-50 MG PO TABS: 1.0000 | ORAL_TABLET | Freq: Every evening | ORAL | Status: DC | PRN

## 2022-05-23 NOTE — Progress Notes (Signed)
Stephanie Brewer is a 57 y.o. female presents to office today for annual physical exam examination.    Concerns today include: 1. LLQ pain Patient reports onset of left lower quadrant pain on Sunday.  She had associated nausea, vomiting and subjective fevers.  She has been diagnosed with sigmoid diverticulitis x 2 since September.  Is not established with a gastroenterologist as her one in [place] has moved from the area.  Denies any changes in bowel habits, rectal bleeding.  She has not been able to tolerate fluids well.  She has had difficulty walking secondary to abdominal pain.  No dysuria or hematuria reported.  Occupation: insurance, Marital status: married, Substance use: none Diet: typical american Last mammogram: UTD Immunizations needed: Immunization History  Administered Date(s) Administered   Td 07/14/1997, 07/28/2008     Past Medical History:  Diagnosis Date   Cancer ([place])    Displacement of breast implant 12/24/2017   DVT (deep venous thrombosis) ([facility])    left leg knee and ankle    GERD (gastroesophageal reflux disease)    Social History   Socioeconomic History   Marital status: Married    Spouse name: Not on file   Number of children: 2   Years of education: some college   Highest education level: Not on file  Occupational History   Occupation: [employer]  Tobacco Use   Smoking status: Never   Smokeless tobacco: Never  Vaping Use   Vaping Use: Never used  Substance and Sexual Activity   Alcohol use: Not Currently    Comment: rare-twice monthly or less   Drug use: No   Sexual activity: Yes    Birth control/protection: I.U.D.  Other Topics Concern   Not on file  Social History Narrative   Lives with spouse   Caffeine use: no caffeine    Left handed    Works for an Forensic psychologist to read fiction   Social Determinants of Radio broadcast assistant Strain: Not on file  Food Insecurity: Not on file  Transportation Needs: Not on file   Physical Activity: Not on file  Stress: Not on file  Social Connections: Not on file  Intimate Partner Violence: Not on file   Past Surgical History:  Procedure Laterality Date   BREAST IMPLANT REMOVAL Bilateral 01/14/2020   Procedure: [place];  Surgeon: Cindra Presume, MD;  Location: [facility];  Service: Plastics;  Laterality: Bilateral;   BREAST LUMPECTOMY WITH RADIOACTIVE SEED AND SENTINEL LYMPH NODE BIOPSY Right 01/14/2020   Procedure: Right breast seed localized lumpectomy with sentinel lymph node biopsy;  Surgeon: Rolm Bookbinder, MD;  Location: [facility];  Service: General;  Laterality: Right;   BREAST SURGERY     Augmentation 2003   HERNIA REPAIR  2002   LIPOSUCTION WITH LIPOFILLING Bilateral 11/29/2020   Procedure: LIPOSUCTION WITH LIPOFILLING FROM ABDOMEN TO BILATERAL BREASTS;  Surgeon: Cindra Presume, MD;  Location: [facility];  Service: Plastics;  Laterality: Bilateral;   MASTOPEXY Left 11/29/2020   Procedure: LEFT MASTOPEXY;  Surgeon: Cindra Presume, MD;  Location: [facility];  Service: Plastics;  Laterality: Left;   PORTACATH PLACEMENT N/A 09/01/2019   Procedure: INSERTION PORT-A-CATH WITH ULTRASOUND GUIDANCE;  Surgeon: Rolm Bookbinder, MD;  Location: [facility];  Service: General;  Laterality: N/A;   TRIGGER FINGER RELEASE Left 05/09/2020   Procedure: RELEASE TRIGGER FINGER/A-1 PULLEY LEFT LONG;  Surgeon: Leanora Cover,  MD;  Location: [facility];  Service: Orthopedics;  Laterality: Left;   Family History  Problem Relation Age of Onset   Hypertension Father     Current Outpatient Medications:    alendronate (FOSAMAX) 70 MG tablet, Take 1 tablet (70 mg total) by mouth once a week. Take with a full glass of water on an empty stomach., Disp: 12 tablet, Rfl: 3   anastrozole (ARIMIDEX) 1 MG tablet, TAKE ONE TABLET ONCE DAILY, Disp: 90 tablet, Rfl: 3   calcium-vitamin  D (OSCAL WITH D) 500-200 MG-UNIT tablet, Take 1 tablet by mouth in the morning and at bedtime., Disp: , Rfl:    glucose blood test strip, UAD to check sugar. R73.03, Disp: 100 each, Rfl: 12   Lancets Thin MISC, UAD to check sugar. R73.03, Disp: 100 each, Rfl: 12   ondansetron (ZOFRAN) 4 MG tablet, Take 1 tablet (4 mg total) by mouth every 6 (six) hours as needed for nausea or vomiting., Disp: 20 tablet, Rfl: 0   ondansetron (ZOFRAN) 4 MG tablet, Take 1 tablet (4 mg total) by mouth every 6 (six) hours., Disp: 12 tablet, Rfl: 0   Semaglutide-Weight Management (WEGOVY) 2.4 MG/0.75ML SOAJ, Inject 2.4 mg into the skin every 7 (seven) days., Disp: 9 mL, Rfl: 3   XARELTO 20 MG TABS tablet, TAKE ONE TABLET ONCE DAILY WITH SUPPER, Disp: 30 tablet, Rfl: 12  Allergies  Allergen Reactions   Hyoscyamine Anaphylaxis   Doxycycline     Racing heart.   Nexium [Esomeprazole]     Swollen throat     ROS: Review of Systems Pertinent items noted in HPI and remainder of comprehensive ROS otherwise negative.    Physical exam BP 102/74   Pulse 100   Temp 98.6 F (37 C)   Ht 5' 4"$  (1.626 m)   Wt 123 lb (55.8 kg)   SpO2 100%   BMI 21.11 kg/m  General appearance: alert, cooperative, appears stated age, and mild distress Head: Normocephalic, without obvious abnormality, atraumatic Eyes: negative findings: lids and lashes normal, conjunctivae and sclerae normal, corneas clear, and pupils equal, round, reactive to light and accomodation Ears: normal TM's and external ear canals both ears Nose: Nares normal. Septum midline. Mucosa normal. No drainage or sinus tenderness. Throat: lips, mucosa, and tongue normal; teeth and gums normal Neck: no adenopathy, supple, symmetrical, trachea midline, and thyroid not enlarged, symmetric, no tenderness/mass/nodules Back: symmetric, no curvature. ROM normal. No CVA tenderness. Lungs: clear to auscultation bilaterally Heart: regular rate and rhythm, S1, S2 normal, no  murmur, click, rub or gallop Abdomen:  Diffuse tenderness to palpation, particular in the left lower quadrant and suprapubic area.  She has clear pain when she is ambulating.  Guarding present Extremities: extremities normal, atraumatic, no cyanosis or edema Pulses: 2+ and symmetric Skin:  Decreased skin turgor Lymph nodes: Cervical, supraclavicular, and axillary nodes normal. Neurologic: Grossly normal  Assessment/ Plan: [name] here for annual physical exam.   Annual physical exam  LLQ pain - Plan: Ambulatory referral to Gastroenterology  Pure hypercholesterolemia - Plan: Lipid Panel  Malignant neoplasm of upper-outer quadrant of right breast in female, estrogen receptor positive ([place])  Osteoporosis of femur without pathological fracture - Plan: TSH, VITAMIN D 25 Hydroxy (Vit-D Deficiency, Fractures), Basic Metabolic Panel  Pre-diabetes - Plan: Bayer DCA Hb A1c Waived  Overweight (BMI 25.0-29.9)  Diverticulitis - Plan: Ambulatory referral to Gastroenterology  Exam remarkable for diffuse abdominal pain on palpation that is concentrated in the LLQ and  suprapubic areas.  She is borderline tachycardic and hypotensive.  We discussed concerns given recurrent diverticulitis.  She most certainly needs to be seen a gastroenterologist for colonoscopy.  Stat referral placed today for [facility]  I have contacted the charge nurse at [facility] to inform of need for IV resuscitation, CAT scan.  Given recurrence I do question if perhaps she needs admission.  I worry about peritonitis.  Labs ordered.  Counseled on healthy lifestyle choices, including diet (rich in fruits, vegetables and lean meats and low in salt and simple carbohydrates) and exercise (at least 30 minutes of moderate physical activity daily).    Kamau Weatherall M. Lajuana Ripple, DO

## 2022-05-23 NOTE — Consult Note (Signed)
Reason for Consult: Recurrent diverticulitis Referring Physician: Reesa Chew MD Stephanie Brewer is an 57 y.o. female.  HPI:  Patient presents as a transfer from [place] due to recurrent diverticulitis.  She was having abdominal pain and saw her PCP.  Her PCP sent her to the emergency room where a CT scan showed chronic diverticulitis with possible intramural abscess or phlegmon involving the sigmoid colon.  She has a normal white count.  Currently she is afebrile and her vital signs are stable.  She is reading a book, she has had 3 episodes in the last 6 months of diverticulitis either requiring hospitalization or antibiotics.  She has had a 2-day history of left lower quadrant abdominal pain.  This persisted.  She has not had a colonoscopy in 7 years.  She is trying to find a new GI doctor to help with that. Past Medical History:  Diagnosis Date   Cancer ([facility])    Displacement of breast implant 12/24/2017   DVT (deep venous thrombosis) (HCC)    left leg knee and ankle    GERD (gastroesophageal reflux disease)     Past Surgical History:  Procedure Laterality Date   BREAST IMPLANT REMOVAL Bilateral 01/14/2020   Procedure: REMOVAL BREAST IMPLANTS;  Surgeon: Cindra Presume, MD;  Location: [facility];  Service: Plastics;  Laterality: Bilateral;   BREAST LUMPECTOMY WITH RADIOACTIVE SEED AND SENTINEL LYMPH NODE BIOPSY Right 01/14/2020   Procedure: Right breast seed localized lumpectomy with sentinel lymph node biopsy;  Surgeon: Rolm Bookbinder, MD;  Location: [facility];  Service: General;  Laterality: Right;   BREAST SURGERY     Augmentation 2003   HERNIA REPAIR  2002   LIPOSUCTION WITH LIPOFILLING Bilateral 11/29/2020   Procedure: LIPOSUCTION WITH LIPOFILLING FROM ABDOMEN TO BILATERAL BREASTS;  Surgeon: Cindra Presume, MD;  Location: [facility];  Service: Plastics;  Laterality: Bilateral;   MASTOPEXY Left 11/29/2020   Procedure: LEFT  MASTOPEXY;  Surgeon: Cindra Presume, MD;  Location: [facility];  Service: Plastics;  Laterality: Left;   PORTACATH PLACEMENT N/A 09/01/2019   Procedure: INSERTION PORT-A-CATH WITH ULTRASOUND GUIDANCE;  Surgeon: Rolm Bookbinder, MD;  Location: [facility];  Service: General;  Laterality: N/A;   TRIGGER FINGER RELEASE Left 05/09/2020   Procedure: RELEASE TRIGGER FINGER/A-1 PULLEY LEFT LONG;  Surgeon: Leanora Cover, MD;  Location: [facility];  Service: Orthopedics;  Laterality: Left;    Family History  Problem Relation Age of Onset   Hypertension Father     Social History:  reports that she has never smoked. She has never used smokeless tobacco. She reports that she does not currently use alcohol. She reports that she does not use drugs.  Allergies:  Allergies  Allergen Reactions   Hyoscyamine Anaphylaxis   Fosamax [Alendronate] Nausea Only and Other (See Comments)    SEVERE NAUSEA   Nexium [Esomeprazole] Swelling and Other (See Comments)    Tongue became swollen- breathing not affected   Doxycycline Palpitations and Other (See Comments)    Racing heart    Medications: I have reviewed the patient's current medications.  Results for orders placed or performed during the hospital encounter of 05/23/22 (from the past 48 hour(s))  Lipase, blood     Status: None   Collection Time: 05/23/22  1:16 PM  Result Value Ref Range   Lipase 13 11 - 51 U/L    Comment: Performed at [facility], [place]  [place], [place], [place] [address]  Comprehensive metabolic panel     Status: Abnormal   Collection Time: 05/23/22  1:16 PM  Result Value Ref Range   Sodium 137 135 - 145 mmol/L   Potassium 3.9 3.5 - 5.1 mmol/L   Chloride 99 98 - 111 mmol/L   CO2 24 22 - 32 mmol/L   Glucose, Bld 90 70 - 99 mg/dL    Comment: Glucose reference range applies only to samples taken after fasting for at least 8 hours.   BUN 12 6 - 20 mg/dL   Creatinine,  Ser 0.72 0.44 - 1.00 mg/dL   Calcium 9.6 8.9 - 10.3 mg/dL   Total Protein 7.6 6.5 - 8.1 g/dL   Albumin 4.3 3.5 - 5.0 g/dL   AST 9 (L) 15 - 41 U/L   ALT 5 0 - 44 U/L   Alkaline Phosphatase 66 38 - 126 U/L   Total Bilirubin 0.9 0.3 - 1.2 mg/dL   GFR, Estimated >60 >60 mL/min    Comment: (NOTE) Calculated using the CKD-EPI Creatinine Equation (2021)    Anion gap 14 5 - 15    Comment: Performed at [facility], [address] [place], [place] [address]  CBC with Differential     Status: Abnormal   Collection Time: 05/23/22  1:16 PM  Result Value Ref Range   WBC 9.7 4.0 - 10.5 K/uL   RBC 4.50 3.87 - 5.11 MIL/uL   Hemoglobin 13.3 12.0 - 15.0 g/dL   HCT 39.1 36.0 - 46.0 %   MCV 86.9 80.0 - 100.0 fL   MCH 29.6 26.0 - 34.0 pg   MCHC 34.0 30.0 - 36.0 g/dL   RDW 13.3 11.5 - 15.5 %   Platelets 202 150 - 400 K/uL   nRBC 0.0 0.0 - 0.2 %   Neutrophils Relative % 83 %   Neutro Abs 8.0 (H) 1.7 - 7.7 K/uL   Lymphocytes Relative 7 %   Lymphs Abs 0.7 0.7 - 4.0 K/uL   Monocytes Relative 10 %   Monocytes Absolute 1.0 0.1 - 1.0 K/uL   Eosinophils Relative 0 %   Eosinophils Absolute 0.0 0.0 - 0.5 K/uL   Basophils Relative 0 %   Basophils Absolute 0.0 0.0 - 0.1 K/uL   Immature Granulocytes 0 %   Abs Immature Granulocytes 0.04 0.00 - 0.07 K/uL    Comment: Performed at [facility], [place], [place] [address]  Urinalysis, Routine w reflex microscopic -Urine, Clean Catch     Status: Abnormal   Collection Time: 05/23/22  1:16 PM  Result Value Ref Range   Color, Urine YELLOW YELLOW   APPearance CLEAR CLEAR   Specific Gravity, Urine 1.035 (H) 1.005 - 1.030   pH 6.0 5.0 - 8.0   Glucose, UA NEGATIVE NEGATIVE mg/dL   Hgb urine dipstick MODERATE (A) NEGATIVE   Bilirubin Urine SMALL (A) NEGATIVE   Ketones, ur >80 (A) NEGATIVE mg/dL   Protein, ur 30 (A) NEGATIVE mg/dL   Nitrite NEGATIVE NEGATIVE   Leukocytes,Ua SMALL (A) NEGATIVE   RBC / HPF  0-5 0 - 5 RBC/hpf   WBC, UA 21-50 0 - 5 WBC/hpf   Bacteria, UA NONE SEEN NONE SEEN   Squamous Epithelial / HPF 0-5 0 - 5 /HPF   Mucus PRESENT     Comment: Performed at [facility], [place], [place] [address]  Lactic acid, plasma     Status: None   Collection Time:  05/23/22  6:36 PM  Result Value Ref Range   Lactic Acid, Venous 0.8 0.5 - 1.9 mmol/L    Comment: Performed at [facility], [place] [address], [place], [place] [address]  Heparin level (unfractionated)     Status: Abnormal   Collection Time: 05/23/22  6:36 PM  Result Value Ref Range   Heparin Unfractionated <0.10 (L) 0.30 - 0.70 IU/mL    Comment: (NOTE) The clinical reportable range upper limit is being lowered to >1.10 to align with the FDA approved guidance for the current laboratory assay.  If heparin results are below expected values, and patient dosage has  been confirmed, suggest follow up testing of antithrombin III levels. Performed at [facility], [place] [address], [place], [place] [address]   APTT     Status: None   Collection Time: 05/23/22  6:36 PM  Result Value Ref Range   aPTT 33 24 - 36 seconds    Comment: Performed at [facility], [place] [address] [place], [place] [address]    CT ABDOMEN PELVIS W CONTRAST  Result Date: 05/23/2022 CLINICAL DATA:  Diverticulitis, complication suspected EXAM: CT ABDOMEN AND PELVIS WITH CONTRAST TECHNIQUE: Multidetector CT imaging of the abdomen and pelvis was performed using the standard protocol following bolus administration of intravenous contrast. RADIATION DOSE REDUCTION: This exam was performed according to the departmental dose-optimization program which includes automated exposure control, adjustment of the mA and/or kV according to patient size and/or use of iterative reconstruction technique. CONTRAST:  47m OMNIPAQUE IOHEXOL 300 MG/ML  SOLN COMPARISON:  04/02/2022 FINDINGS:  Lower chest: Included lung bases are clear.  Heart size is normal. Hepatobiliary: Stable small partially calcified lesion at the right hepatic dome. No new focal liver abnormality. Unremarkable gallbladder. No hyperdense gallstone. No biliary dilatation. Pancreas: Unremarkable. No pancreatic ductal dilatation or surrounding inflammatory changes. Spleen: Normal in size without focal abnormality. Adrenals/Urinary Tract: Unremarkable adrenal glands. Stable 4.0 cm right renal cyst which does not require follow-up imaging. No solid renal lesion, stone, or hydronephrosis. Urinary bladder is incompletely distended, limiting its evaluation. Stomach/Bowel: Stomach within normal limits. No dilated loops of bowel. Normal appendix in the right lower quadrant. Severe changes of colitis of the sigmoid colon where there are numerous prominent diverticula. Suspect developing intramural phlegmon/abscess involving the sigmoid colon at this location measuring approximately 4.5 x 0.9 x 1.8 cm (series 2, image 68). Similar degree of fat stranding and adjacent free fluid. No organized extramural collections. No extraluminal air. Vascular/Lymphatic: No significant vascular findings are present. No enlarged abdominal or pelvic lymph nodes. Reproductive: Uterus and right adnexa are unremarkable. Inflammatory changes in the pelvis closely approximate the left ovary, as previously seen. Other: No ascites.  No pneumoperitoneum. Musculoskeletal: No acute or significant osseous findings. IMPRESSION: Severe changes of colitis of the sigmoid colon where there are numerous prominent diverticula, slightly worsened compared to the previous CT. Suspect developing intramural phlegmon/abscess involving the sigmoid colon at this location measuring approximately 4.5 x 0.9 x 1.8 cm. No organized extramural collections. No extraluminal air. Electronically Signed   By: NDavina PokeD.O.   On: 05/23/2022 15:08    Review of Systems  Gastrointestinal:   Positive for abdominal pain. Negative for nausea and vomiting.  All other systems reviewed and are negative.  Blood pressure 108/72, pulse 83, temperature 98.3 F (36.8 C), resp. rate 18, height 5' 4"$  (1.626 m), weight 55.8 kg, SpO2 100 %. Physical Exam HENT:     Head: Normocephalic.  Cardiovascular:     Rate and Rhythm: Normal rate.  Abdominal:     Palpations: Abdomen is soft.     Tenderness: There is abdominal tenderness in the left lower quadrant.  Skin:    General: Skin is warm.  Neurological:     General: No focal deficit present.     Mental Status: She is alert.  Psychiatric:        Mood and Affect: Mood normal.        Behavior: Behavior normal.     Assessment/Plan: Recurrent diverticulitis with intramural abscess versus phlegmon-she has stable disease currently.  She has had multiple bouts and may benefit from surgical resection at some point.  Ideally, this will improve with antibiotics and she could be seen by one of her colorectal surgeons as an outpatient to consider resection.  She would require colonoscopy prior to that.  If she does not improve, she may require resection here and a colostomy.  She is very comfortable now and her white count is normal.  I suspect she will improve with medical management but we will follow along with you.  Stephanie Faster Jilda Kress MD 05/23/2022, 8:48 PM      I personally reviewed images of CT scan showing diverticulitis with phlegmon versus abscess. I reviewed recent lab values, vitals, and notes.  This care required moderate level of medical decision making.

## 2022-05-23 NOTE — Plan of Care (Signed)
57 year old female coming from [facility] with abdominal pain nausea and vomiting and subjective fever CT abdomen shows findings concerning for diverticulitis with early abscess formation. No leukocytosis.  Vital signs significant for tachycardia with normal blood pressure. General surgery consulted by the ED physician. I have accepted her to [facility].

## 2022-05-23 NOTE — Progress Notes (Signed)
Pharmacy Antibiotic Note  Stephanie Brewer is a 57 y.o. female admitted on 05/23/2022 with  abdominal pain . Pharmacy has been consulted for zosyn dosing. Pt is afebrile and WBC is WNL. Scr is WNL.   Plan: Zosyn 3.375g IV Q8H (4 hr inf) F/u renal fxn, C&S, clinical status and LOT *Pharmacy will sign off and only follow peripherally as no further dose adjustments are anticipated.     Temp (24hrs), Avg:98.9 F (37.2 C), Min:98.6 F (37 C), Max:99.1 F (37.3 C)  Recent Labs  Lab 05/23/22 1316  WBC 9.7  CREATININE 0.72    Estimated Creatinine Clearance: 67 mL/min (by C-G formula based on SCr of 0.72 mg/dL).    Allergies  Allergen Reactions   Hyoscyamine Anaphylaxis   Doxycycline     Racing heart.   Nexium [Esomeprazole]     Swollen throat    Thank you for allowing pharmacy to be a part of this patient's care.  Leomar Westberg, Rande Lawman 05/23/2022 3:29 PM

## 2022-05-23 NOTE — ED Provider Notes (Signed)
  Physical Exam  BP 119/65   Pulse 84   Temp 99.2 F (37.3 C) (Oral)   Resp 18   SpO2 99%   Physical Exam  Procedures  Procedures  ED Course / MDM    Medical Decision Making Amount and/or Complexity of Data Reviewed Labs: ordered. Radiology: ordered.  Risk OTC drugs. Prescription drug management. Decision regarding hospitalization.   [facility] of general surgery consulted.  They will do a consult on the patient when arrives for admission.       Charlesetta Shanks, MD 05/23/22 1556

## 2022-05-23 NOTE — ED Triage Notes (Signed)
Pt has hx of diverticulitis. Sunday had nausea, loss of appetite, abdominal pain, chills, fever, pain on left side, bloating. PCP sent.

## 2022-05-23 NOTE — Progress Notes (Signed)
[facility] for IV heparin Indication: recurrent VTE  Allergies  Allergen Reactions   Hyoscyamine Anaphylaxis   Doxycycline     Racing heart.   Nexium [Esomeprazole]     Swollen throat    Patient Measurements: Height: 5' 4"$  (162.6 cm) Weight: 55.8 kg (122 lb 15.9 oz) IBW/kg (Calculated) : 54.7 Heparin Dosing Weight: TBW  Vital Signs: Temp: 98.3 F (36.8 C) (02/21 1714) Temp Source: Oral (02/21 1624) BP: 108/72 (02/21 1714) Pulse Rate: 83 (02/21 1714)  Labs: Recent Labs    05/23/22 1316  HGB 13.3  HCT 39.1  PLT 202  CREATININE 0.72    Estimated Creatinine Clearance: 67 mL/min (by C-G formula based on SCr of 0.72 mg/dL).   Medical History: Past Medical History:  Diagnosis Date   Cancer ([facility])    Displacement of breast implant 12/24/2017   DVT (deep venous thrombosis) (HCC)    left leg knee and ankle    GERD (gastroesophageal reflux disease)     Medications:  Medications Prior to Admission  Medication Sig Dispense Refill Last Dose   alendronate (FOSAMAX) 70 MG tablet Take 1 tablet (70 mg total) by mouth once a week. Take with a full glass of water on an empty stomach. 12 tablet 3    anastrozole (ARIMIDEX) 1 MG tablet TAKE ONE TABLET ONCE DAILY 90 tablet 3    calcium-vitamin D (OSCAL WITH D) 500-200 MG-UNIT tablet Take 1 tablet by mouth in the morning and at bedtime.      glucose blood test strip UAD to check sugar. R73.03 100 each 12    Lancets Thin MISC UAD to check sugar. R73.03 100 each 12    ondansetron (ZOFRAN) 4 MG tablet Take 1 tablet (4 mg total) by mouth every 6 (six) hours as needed for nausea or vomiting. 20 tablet 0    ondansetron (ZOFRAN) 4 MG tablet Take 1 tablet (4 mg total) by mouth every 6 (six) hours. 12 tablet 0    Semaglutide-Weight Management (WEGOVY) 2.4 MG/0.75ML SOAJ Inject 2.4 mg into the skin every 7 (seven) days. 9 mL 3    XARELTO 20 MG TABS tablet TAKE ONE TABLET ONCE DAILY WITH SUPPER 30 tablet 12     Scheduled:   heparin  2,000 Units Intravenous Once   PRN: acetaminophen, guaiFENesin, hydrALAZINE, HYDROmorphone (DILAUDID) injection, ipratropium-albuterol, metoprolol tartrate, ondansetron (ZOFRAN) IV, senna-docusate, traZODone  Assessment: 49 yoF with PMH breast cancer on antiestrogen therapy, on Xarelto PTA for recurrent DVT, recurrent diverticulitis, presents 2/21 with abd pain. Found to have acute sigmoid diverticulitis w/ abscess. Surgery consulted but no immediate plans for invasive procedures. Anticoagulation converted to heparin infusion with Pharmacy to dose while Xarelto on hold.  Baseline aPTT: pending Baseline heparin level: pending Prior anticoagulation: Xarelto 20 mg daily, last dose 2/20 AM  Significant events:  Today, 05/23/2022: CBC: Hgb/Plt WNL SCr WNL; at baseline No bleeding or infusion issues per nursing  Goal of Therapy: Heparin level 0.3-0.7 units/ml Monitor platelets by anticoagulation protocol: Yes  Plan: Heparin 2000 units IV bolus x 1 Heparin 800 units/hr IV infusion Check aPTT level 8 hrs after start; anticipate heparin level will be artificially elevated d/t recent DOAC Daily CBC & heparin level; aPTT as needed while heparin level remains skewed Monitor for signs of bleeding or thrombosis  Reuel Boom, PharmD, BCPS [phone] 05/23/2022, 6:29 PM

## 2022-05-23 NOTE — H&P (Signed)
History and Physical    SONDRA BLUMER [id number] DOB: Dec 03, 1965 DOA: 05/23/2022  PCP: [name], DO Patient coming from: PCP office  Chief Complaint: Abdominal pain  HPI: Stephanie Brewer is a 57 y.o. female with medical history significant of breast cancer on anastrozole, recurrent DVT on Xarelto, GERD, history of recurrent diverticulitis comes to the hospital with abdominal pain.  Patient stated she started having lower abdominal discomfort and subjective fevers and chills about 4 days ago which progressed throughout the week.  She had a routine PCP appointment who sent her to the hospital due to her symptoms and her history.   At [facility] ED she was found to have acute diverticulitis with abscess therefore admitted to the hospital, EDP also spoke with general surgery.  Patient reports that she had her first episode of uncomplicated sigmoid diverticulitis in September 2023 treated medically then another episode in December 2023 which was treated medically as well.  She has not had a chance to follow-up with GI.  Her last colonoscopy was about 7 years ago which she reports to be normal.   Review of Systems: As per HPI otherwise 10 point review of systems negative.  Review of Systems Otherwise negative except as per HPI, including: General: Denies f night sweats or unintended weight loss. Resp: Denies cough, wheezing, shortness of breath. Cardiac: Denies chest pain, palpitations, orthopnea, paroxysmal nocturnal dyspnea. GI: Denies  nausea, vomiting, diarrhea or constipation GU: Denies dysuria, frequency, hesitancy or incontinence MS: Denies muscle aches, joint pain or swelling Neuro: Denies headache, neurologic deficits (focal weakness, numbness, tingling), abnormal gait Psych: Denies anxiety, depression, SI/HI/AVH Skin: Denies new rashes or lesions ID: Denies sick contacts, exotic exposures, travel  Past Medical History:  Diagnosis Date   Cancer ([place])    Displacement  of breast implant 12/24/2017   DVT (deep venous thrombosis) (HCC)    left leg knee and ankle    GERD (gastroesophageal reflux disease)     Past Surgical History:  Procedure Laterality Date   BREAST IMPLANT REMOVAL Bilateral 01/14/2020   Procedure: REMOVAL BREAST IMPLANTS;  Surgeon: Cindra Presume, MD;  Location: [facility];  Service: Plastics;  Laterality: Bilateral;   BREAST LUMPECTOMY WITH RADIOACTIVE SEED AND SENTINEL LYMPH NODE BIOPSY Right 01/14/2020   Procedure: Right breast seed localized lumpectomy with sentinel lymph node biopsy;  Surgeon: Rolm Bookbinder, MD;  Location: [facility];  Service: General;  Laterality: Right;   BREAST SURGERY     Augmentation 2003   HERNIA REPAIR  2002   LIPOSUCTION WITH LIPOFILLING Bilateral 11/29/2020   Procedure: LIPOSUCTION WITH LIPOFILLING FROM ABDOMEN TO BILATERAL BREASTS;  Surgeon: Cindra Presume, MD;  Location: [facility];  Service: Plastics;  Laterality: Bilateral;   MASTOPEXY Left 11/29/2020   Procedure: LEFT MASTOPEXY;  Surgeon: Cindra Presume, MD;  Location: [facility];  Service: Plastics;  Laterality: Left;   PORTACATH PLACEMENT N/A 09/01/2019   Procedure: INSERTION PORT-A-CATH WITH ULTRASOUND GUIDANCE;  Surgeon: Rolm Bookbinder, MD;  Location: [facility];  Service: General;  Laterality: N/A;   TRIGGER FINGER RELEASE Left 05/09/2020   Procedure: RELEASE TRIGGER FINGER/A-1 PULLEY LEFT LONG;  Surgeon: Leanora Cover, MD;  Location: [facility];  Service: Orthopedics;  Laterality: Left;    SOCIAL HISTORY:  reports that she has never smoked. She has never used smokeless tobacco. She reports that she does not currently use alcohol. She reports that she does not use drugs.  Allergies  Allergen Reactions   Hyoscyamine Anaphylaxis   Doxycycline     Racing heart.   Nexium [Esomeprazole]     Swollen throat    FAMILY HISTORY: Family History  Problem  Relation Age of Onset   Hypertension Father      Prior to Admission medications   Medication Sig Start Date End Date Taking? Authorizing Provider  alendronate (FOSAMAX) 70 MG tablet Take 1 tablet (70 mg total) by mouth once a week. Take with a full glass of water on an empty stomach. 10/04/21   Nicholas Lose, MD  anastrozole (ARIMIDEX) 1 MG tablet TAKE ONE TABLET ONCE DAILY 07/25/21   Nicholas Lose, MD  calcium-vitamin D (OSCAL WITH D) 500-200 MG-UNIT tablet Take 1 tablet by mouth in the morning and at bedtime.    [provider]  glucose blood test strip UAD to check sugar. R73.03 02/14/21   Ronnie Doss M, DO  Lancets Thin MISC UAD to check sugar. R73.03 02/14/21   Ronnie Doss M, DO  ondansetron (ZOFRAN) 4 MG tablet Take 1 tablet (4 mg total) by mouth every 6 (six) hours as needed for nausea or vomiting. 04/02/22   Tretha Sciara, MD  ondansetron (ZOFRAN) 4 MG tablet Take 1 tablet (4 mg total) by mouth every 6 (six) hours. 04/02/22   Tretha Sciara, MD  Semaglutide-Weight Management (WEGOVY) 2.4 MG/0.75ML SOAJ Inject 2.4 mg into the skin every 7 (seven) days. 11/15/21   Ronnie Doss M, DO  XARELTO 20 MG TABS tablet TAKE ONE TABLET ONCE DAILY WITH SUPPER 03/15/22   Nicholas Lose, MD    Physical Exam: Vitals:   05/23/22 1540 05/23/22 1624 05/23/22 1707 05/23/22 1714  BP: 119/65 116/74  108/72  Pulse: 84 82  83  Resp: 18 16  18  $ Temp: 99.2 F (37.3 C) 98.3 F (36.8 C)  98.3 F (36.8 C)  TempSrc: Oral Oral    SpO2:  100%  100%  Weight:   55.8 kg   Height:   5' 4"$  (1.626 m)       Constitutional: NAD, calm, comfortable Eyes: PERRL, lids and conjunctivae normal ENMT: Mucous membranes are moist. Posterior pharynx clear of any exudate or lesions.Normal dentition.  Neck: normal, supple, no masses, no thyromegaly Respiratory: clear to auscultation bilaterally, no wheezing, no crackles. Normal respiratory effort. No accessory muscle use.  Cardiovascular: Regular  rate and rhythm, no murmurs / rubs / gallops. No extremity edema. 2+ pedal pulses. No carotid bruits.  Abdomen: Abdomen is soft but tender to palpation in the lower abdomen.  Positive bowel sounds. Musculoskeletal: no clubbing / cyanosis. No joint deformity upper and lower extremities. Good ROM, no contractures. Normal muscle tone.  Skin: no rashes, lesions, ulcers. No induration Neurologic: CN 2-12 grossly intact. Sensation intact, DTR normal. Strength 5/5 in all 4.  Psychiatric: Normal judgment and insight. Alert and oriented x 3. Normal mood.     Labs on Admission: I have personally reviewed following labs and imaging studies  CBC: Recent Labs  Lab 05/23/22 1316  WBC 9.7  NEUTROABS 8.0*  HGB 13.3  HCT 39.1  MCV 86.9  PLT 123XX123   Basic Metabolic Panel: Recent Labs  Lab 05/23/22 1316  NA 137  K 3.9  CL 99  CO2 24  GLUCOSE 90  BUN 12  CREATININE 0.72  CALCIUM 9.6   GFR: Estimated Creatinine Clearance: 67 mL/min (by C-G formula based on SCr of 0.72 mg/dL). Liver Function Tests: Recent Labs  Lab 05/23/22 1316  AST  9*  ALT 5  ALKPHOS 66  BILITOT 0.9  PROT 7.6  ALBUMIN 4.3   Recent Labs  Lab 05/23/22 1316  LIPASE 13   No results for input(s): "AMMONIA" in the last 168 hours. Coagulation Profile: No results for input(s): "INR", "PROTIME" in the last 168 hours. Cardiac Enzymes: No results for input(s): "CKTOTAL", "CKMB", "CKMBINDEX", "TROPONINI" in the last 168 hours. BNP (last 3 results) No results for input(s): "PROBNP" in the last 8760 hours. HbA1C: Recent Labs    05/23/22 1121  HGBA1C 6.3*   CBG: No results for input(s): "GLUCAP" in the last 168 hours. Lipid Profile: No results for input(s): "CHOL", "HDL", "LDLCALC", "TRIG", "CHOLHDL", "LDLDIRECT" in the last 72 hours. Thyroid Function Tests: No results for input(s): "TSH", "T4TOTAL", "FREET4", "T3FREE", "THYROIDAB" in the last 72 hours. Anemia Panel: No results for input(s): "VITAMINB12",  "FOLATE", "FERRITIN", "TIBC", "IRON", "RETICCTPCT" in the last 72 hours. Urine analysis:    Component Value Date/Time   COLORURINE YELLOW 05/23/2022 1316   APPEARANCEUR CLEAR 05/23/2022 1316   APPEARANCEUR Clear 01/05/2022 1407   LABSPEC 1.035 (H) 05/23/2022 1316   PHURINE 6.0 05/23/2022 1316   GLUCOSEU NEGATIVE 05/23/2022 1316   HGBUR MODERATE (A) 05/23/2022 1316   BILIRUBINUR SMALL (A) 05/23/2022 1316   BILIRUBINUR Negative 01/05/2022 1407   KETONESUR >80 (A) 05/23/2022 1316   PROTEINUR 30 (A) 05/23/2022 1316   NITRITE NEGATIVE 05/23/2022 1316   LEUKOCYTESUR SMALL (A) 05/23/2022 1316   Sepsis Labs: !!!!!!!!!!!!!!!!!!!!!!!!!!!!!!!!!!!!!!!!!!!! @LABRCNTIP$ (procalcitonin:4,lacticidven:4) )No results found for this or any previous visit (from the past 240 hour(s)).   Radiological Exams on Admission: CT ABDOMEN PELVIS W CONTRAST  Result Date: 05/23/2022 CLINICAL DATA:  Diverticulitis, complication suspected EXAM: CT ABDOMEN AND PELVIS WITH CONTRAST TECHNIQUE: Multidetector CT imaging of the abdomen and pelvis was performed using the standard protocol following bolus administration of intravenous contrast. RADIATION DOSE REDUCTION: This exam was performed according to the departmental dose-optimization program which includes automated exposure control, adjustment of the mA and/or kV according to patient size and/or use of iterative reconstruction technique. CONTRAST:  1m OMNIPAQUE IOHEXOL 300 MG/ML  SOLN COMPARISON:  04/02/2022 FINDINGS: Lower chest: Included lung bases are clear.  Heart size is normal. Hepatobiliary: Stable small partially calcified lesion at the right hepatic dome. No new focal liver abnormality. Unremarkable gallbladder. No hyperdense gallstone. No biliary dilatation. Pancreas: Unremarkable. No pancreatic ductal dilatation or surrounding inflammatory changes. Spleen: Normal in size without focal abnormality. Adrenals/Urinary Tract: Unremarkable adrenal glands. Stable 4.0 cm  right renal cyst which does not require follow-up imaging. No solid renal lesion, stone, or hydronephrosis. Urinary bladder is incompletely distended, limiting its evaluation. Stomach/Bowel: Stomach within normal limits. No dilated loops of bowel. Normal appendix in the right lower quadrant. Severe changes of colitis of the sigmoid colon where there are numerous prominent diverticula. Suspect developing intramural phlegmon/abscess involving the sigmoid colon at this location measuring approximately 4.5 x 0.9 x 1.8 cm (series 2, image 68). Similar degree of fat stranding and adjacent free fluid. No organized extramural collections. No extraluminal air. Vascular/Lymphatic: No significant vascular findings are present. No enlarged abdominal or pelvic lymph nodes. Reproductive: Uterus and right adnexa are unremarkable. Inflammatory changes in the pelvis closely approximate the left ovary, as previously seen. Other: No ascites.  No pneumoperitoneum. Musculoskeletal: No acute or significant osseous findings. IMPRESSION: Severe changes of colitis of the sigmoid colon where there are numerous prominent diverticula, slightly worsened compared to the previous CT. Suspect developing intramural phlegmon/abscess involving the sigmoid colon at this  location measuring approximately 4.5 x 0.9 x 1.8 cm. No organized extramural collections. No extraluminal air. Electronically Signed   By: Davina Poke D.O.   On: 05/23/2022 15:08     All images have been reviewed by me personally.    Assessment/Plan Principal Problem:   Diverticulitis Active Problems:   GERD (gastroesophageal reflux disease)   H/O bilateral breast implants   Malignant neoplasm of upper-outer quadrant of right breast in female, estrogen receptor positive ([place])     Recurrent acute sigmoid diverticulitis complicated by abscess -Admit patient to the hospital.  No obvious is of sepsis.  For now we will place her on D5 half-normal saline, Accu-Cheks  every 6 hours, NPO.  IV Zosyn. general surgery consulted.  May need IR consult depending on general surgery recommendations.  Pain control, antiemetics.  History of recurrent DVT - Diagnosed in 2021 in 2022.  Hold outpatient Xarelto, heparin drip  History of breast cancer - Follows outpatient [name].  Outpatient anastrozole on hold   DVT prophylaxis: Heparin drip Code Status: Full code Family Communication: None Consults called: General surgery called by EDP Admission status: Inpatient admission to [facility]  Status is: Inpatient Remains inpatient appropriate because: Hospital admission for IV antibiotics   Time Spent: 65 minutes.  >50% of the time was devoted to discussing the patients care, assessment, plan and disposition with other care givers along with counseling the patient about the risks and benefits of treatment.    Laporchia Nakajima Arsenio Loader MD [facility]  If 7PM-7AM, please contact night-coverage   05/23/2022, 5:44 PM

## 2022-05-23 NOTE — ED Provider Notes (Signed)
[facility] Provider Note   CSN: [id number] Arrival date & time: 05/23/22  1238     History  Chief Complaint  Patient presents with   Abdominal Pain    Stephanie Brewer is a 57 y.o. female.  HPI 57 year old female with a history of breast cancer and previous diverticulitis presents with concern for recurrent diverticulitis.  She has had left lower abdominal pain, fever, vomiting and just not feeling well for about 3 full days.  Saw her PCP today and was sent here for further evaluation of diverticulitis.  Last temperature was last night at 101.  Right now the pain is moderate.  She does feel nauseated.  No diarrhea or blood in the stools.  No other source of infection such as urinary symptoms or cough.  Home Medications Prior to Admission medications   Medication Sig Start Date End Date Taking? Authorizing Provider  alendronate (FOSAMAX) 70 MG tablet Take 1 tablet (70 mg total) by mouth once a week. Take with a full glass of water on an empty stomach. 10/04/21   Nicholas Lose, MD  anastrozole (ARIMIDEX) 1 MG tablet TAKE ONE TABLET ONCE DAILY 07/25/21   Nicholas Lose, MD  calcium-vitamin D (OSCAL WITH D) 500-200 MG-UNIT tablet Take 1 tablet by mouth in the morning and at bedtime.    [provider]  glucose blood test strip UAD to check sugar. R73.03 02/14/21   Ronnie Doss M, DO  Lancets Thin MISC UAD to check sugar. R73.03 02/14/21   Ronnie Doss M, DO  ondansetron (ZOFRAN) 4 MG tablet Take 1 tablet (4 mg total) by mouth every 6 (six) hours as needed for nausea or vomiting. 04/02/22   Tretha Sciara, MD  ondansetron (ZOFRAN) 4 MG tablet Take 1 tablet (4 mg total) by mouth every 6 (six) hours. 04/02/22   Tretha Sciara, MD  Semaglutide-Weight Management (WEGOVY) 2.4 MG/0.75ML SOAJ Inject 2.4 mg into the skin every 7 (seven) days. 11/15/21   Gottschalk, Ashly M, DO  XARELTO 20 MG TABS tablet TAKE ONE TABLET ONCE DAILY WITH  SUPPER 03/15/22   Nicholas Lose, MD      Allergies    Hyoscyamine, Doxycycline, and Nexium [esomeprazole]    Review of Systems   Review of Systems  Constitutional:  Positive for fever.  Respiratory:  Negative for cough.   Gastrointestinal:  Positive for abdominal pain, nausea and vomiting. Negative for blood in stool and diarrhea.  Genitourinary:  Negative for dysuria.    Physical Exam Updated Vital Signs BP 112/82 (BP Location: Right Arm)   Pulse (!) 115   Temp 99.1 F (37.3 C) (Oral)   Resp 18   SpO2 99%  Physical Exam Vitals and nursing note reviewed.  Constitutional:      General: She is not in acute distress.    Appearance: She is well-developed. She is not ill-appearing or diaphoretic.  HENT:     Head: Normocephalic and atraumatic.  Cardiovascular:     Rate and Rhythm: Regular rhythm. Tachycardia present.     Heart sounds: Normal heart sounds.  Pulmonary:     Effort: Pulmonary effort is normal.     Breath sounds: Normal breath sounds.  Abdominal:     Palpations: Abdomen is soft.     Tenderness: There is abdominal tenderness in the right lower quadrant and left lower quadrant.  Skin:    General: Skin is warm and dry.  Neurological:     Mental Status: She is alert.  ED Results / Procedures / Treatments   Labs (all labs ordered are listed, but only abnormal results are displayed) Labs Reviewed  COMPREHENSIVE METABOLIC PANEL - Abnormal; Notable for the following components:      Result Value   AST 9 (*)    All other components within normal limits  CBC WITH DIFFERENTIAL/PLATELET - Abnormal; Notable for the following components:   Neutro Abs 8.0 (*)    All other components within normal limits  URINALYSIS, ROUTINE W REFLEX MICROSCOPIC - Abnormal; Notable for the following components:   Specific Gravity, Urine 1.035 (*)    Hgb urine dipstick MODERATE (*)    Bilirubin Urine SMALL (*)    Ketones, ur >80 (*)    Protein, ur 30 (*)    Leukocytes,Ua SMALL  (*)    All other components within normal limits  LIPASE, BLOOD    EKG None  Radiology CT ABDOMEN PELVIS W CONTRAST  Result Date: 05/23/2022 CLINICAL DATA:  Diverticulitis, complication suspected EXAM: CT ABDOMEN AND PELVIS WITH CONTRAST TECHNIQUE: Multidetector CT imaging of the abdomen and pelvis was performed using the standard protocol following bolus administration of intravenous contrast. RADIATION DOSE REDUCTION: This exam was performed according to the departmental dose-optimization program which includes automated exposure control, adjustment of the mA and/or kV according to patient size and/or use of iterative reconstruction technique. CONTRAST:  67m OMNIPAQUE IOHEXOL 300 MG/ML  SOLN COMPARISON:  04/02/2022 FINDINGS: Lower chest: Included lung bases are clear.  Heart size is normal. Hepatobiliary: Stable small partially calcified lesion at the right hepatic dome. No new focal liver abnormality. Unremarkable gallbladder. No hyperdense gallstone. No biliary dilatation. Pancreas: Unremarkable. No pancreatic ductal dilatation or surrounding inflammatory changes. Spleen: Normal in size without focal abnormality. Adrenals/Urinary Tract: Unremarkable adrenal glands. Stable 4.0 cm right renal cyst which does not require follow-up imaging. No solid renal lesion, stone, or hydronephrosis. Urinary bladder is incompletely distended, limiting its evaluation. Stomach/Bowel: Stomach within normal limits. No dilated loops of bowel. Normal appendix in the right lower quadrant. Severe changes of colitis of the sigmoid colon where there are numerous prominent diverticula. Suspect developing intramural phlegmon/abscess involving the sigmoid colon at this location measuring approximately 4.5 x 0.9 x 1.8 cm (series 2, image 68). Similar degree of fat stranding and adjacent free fluid. No organized extramural collections. No extraluminal air. Vascular/Lymphatic: No significant vascular findings are present. No  enlarged abdominal or pelvic lymph nodes. Reproductive: Uterus and right adnexa are unremarkable. Inflammatory changes in the pelvis closely approximate the left ovary, as previously seen. Other: No ascites.  No pneumoperitoneum. Musculoskeletal: No acute or significant osseous findings. IMPRESSION: Severe changes of colitis of the sigmoid colon where there are numerous prominent diverticula, slightly worsened compared to the previous CT. Suspect developing intramural phlegmon/abscess involving the sigmoid colon at this location measuring approximately 4.5 x 0.9 x 1.8 cm. No organized extramural collections. No extraluminal air. Electronically Signed   By: NDavina PokeD.O.   On: 05/23/2022 15:08    Procedures Procedures    Medications Ordered in ED Medications  piperacillin-tazobactam (ZOSYN) IVPB 3.375 g (has no administration in time range)  piperacillin-tazobactam (ZOSYN) IVPB 3.375 g (has no administration in time range)  ondansetron (ZOFRAN) injection 4 mg (4 mg Intravenous Given 05/23/22 1401)  acetaminophen (TYLENOL) tablet 650 mg (650 mg Oral Given 05/23/22 1405)  sodium chloride 0.9 % bolus 1,000 mL (0 mLs Intravenous Stopped 05/23/22 1534)  iohexol (OMNIPAQUE) 300 MG/ML solution 100 mL (70 mLs Intravenous Contrast Given 05/23/22 1447)  ED Course/ Medical Decision Making/ A&P                             Medical Decision Making Amount and/or Complexity of Data Reviewed Labs: ordered.    Details: WBC is normal.  No significant electrolyte disturbance. Radiology: ordered and independent interpretation performed.    Details: Significant diverticulitis and probable developing abscess.  No obvious perforation.  Risk OTC drugs. Prescription drug management.   Patient presents with recurrent diverticulitis.  Has had fevers at home.  Appears well here though is tachycardic with a low-grade temperature.  She declines anything for pain at this moment.  She was given fluids and  Zofran.  Labs are reassuring but her CT shows significant diverticulitis with what is likely a developing phlegmon/intramural abscess.  I have discussed case with hospitalist, Dr. Zigmund Daniel.  I have also paged general surgery regarding this.        Final Clinical Impression(s) / ED Diagnoses Final diagnoses:  Diverticulitis    Rx / DC Orders ED Discharge Orders     None         Sherwood Gambler, MD 05/23/22 1541

## 2022-05-23 NOTE — Consult Note (Incomplete)
[name] 1965-07-19  [id number].    Requesting MD: Dr. Charlesetta Shanks Chief Complaint/Reason for Consult: Diverticulitis   HPI: Stephanie Brewer is a 57 y.o. female who presented to the ED for abdominal pain. Patient was seen in the ED 04/02/22 where she was dx with sigmoid diverticulitis and rx'd cipro/flagyl. Improved after abx ***. Over the last 3 days she has had LLQ pain. Notes associated fever, n/v. No diarrhea or hematochezia. In the ED she was afebrile. Initially tachycardic that resolved after IVF. No hypotension. WBC 9.7. CT w/ sigmoid diverticulitis with intramural abscess. Started on IV abx. TRH to admit. We were asked to consult.     PMhx: DVT on Xarelto (last dose ***), breast cancer s/p lumpectomy Prior abdominal surgeries: Hernia repair (2002) Last colonoscopy: 2016 at [facility] showed diverticulosis and 3 small ulcers in her terminal ileum with bx results non-specific.     ROS: ROS As above, see hpi  Family History  Problem Relation Age of Onset   Hypertension Father     Past Medical History:  Diagnosis Date   Cancer ([place])    Displacement of breast implant 12/24/2017   DVT (deep venous thrombosis) ([facility])    left leg knee and ankle    GERD (gastroesophageal reflux disease)     Past Surgical History:  Procedure Laterality Date   BREAST IMPLANT REMOVAL Bilateral 01/14/2020   Procedure: REMOVAL BREAST IMPLANTS;  Surgeon: Cindra Presume, MD;  Location: [place];  Service: Plastics;  Laterality: Bilateral;   BREAST LUMPECTOMY WITH RADIOACTIVE SEED AND SENTINEL LYMPH NODE BIOPSY Right 01/14/2020   Procedure: Right breast seed localized lumpectomy with sentinel lymph node biopsy;  Surgeon: Rolm Bookbinder, MD;  Location: [place];  Service: General;  Laterality: Right;   BREAST SURGERY     Augmentation 2003   HERNIA REPAIR  2002   LIPOSUCTION WITH LIPOFILLING Bilateral 11/29/2020   Procedure: LIPOSUCTION WITH  LIPOFILLING FROM ABDOMEN TO BILATERAL BREASTS;  Surgeon: Cindra Presume, MD;  Location: [place];  Service: Plastics;  Laterality: Bilateral;   MASTOPEXY Left 11/29/2020   Procedure: LEFT MASTOPEXY;  Surgeon: Cindra Presume, MD;  Location: [place];  Service: Plastics;  Laterality: Left;   PORTACATH PLACEMENT N/A 09/01/2019   Procedure: INSERTION PORT-A-CATH WITH ULTRASOUND GUIDANCE;  Surgeon: Rolm Bookbinder, MD;  Location: [place];  Service: General;  Laterality: N/A;   TRIGGER FINGER RELEASE Left 05/09/2020   Procedure: RELEASE TRIGGER FINGER/A-1 PULLEY LEFT LONG;  Surgeon: Leanora Cover, MD;  Location: [place];  Service: Orthopedics;  Laterality: Left;    Social History:  reports that she has never smoked. She has never used smokeless tobacco. She reports that she does not currently use alcohol. She reports that she does not use drugs.  Allergies:  Allergies  Allergen Reactions   Hyoscyamine Anaphylaxis   Doxycycline     Racing heart.   Nexium [Esomeprazole]     Swollen throat    (Not in a hospital admission)    Physical Exam: Blood pressure 119/65, pulse 84, temperature 99.2 F (37.3 C), temperature source Oral, resp. rate 18, SpO2 99 %. General: pleasant, WD/WN female who is laying in bed in NAD HEENT: head is normocephalic, atraumatic.  Sclera are noninjected.  PERRL.  Ears and nose without any masses or lesions.  Mouth is pink and moist. Dentition fair Heart: regular, rate, and rhythm.  Palpable pedal pulses bilaterally  Lungs: CTAB, no wheezes, rhonchi, or rales noted.  Respiratory effort nonlabored Abd: *** Soft, NT/ND, +BS. No masses, hernias, or organomegaly MS: no BUE or BLE edema, calves soft and nontender Skin: warm and dry  Psych: A&Ox4 with an appropriate affect Neuro: cranial nerves grossly intact, normal speech, thought process intact, moves all extremities, gait not assessed***  Results for  orders placed or performed during the hospital encounter of 05/23/22 (from the past 48 hour(s))  Lipase, blood     Status: None   Collection Time: 05/23/22  1:16 PM  Result Value Ref Range   Lipase 13 11 - 51 U/L    Comment: Performed at [facility], [address] [place], [place] [address]  Comprehensive metabolic panel     Status: Abnormal   Collection Time: 05/23/22  1:16 PM  Result Value Ref Range   Sodium 137 135 - 145 mmol/L   Potassium 3.9 3.5 - 5.1 mmol/L   Chloride 99 98 - 111 mmol/L   CO2 24 22 - 32 mmol/L   Glucose, Bld 90 70 - 99 mg/dL    Comment: Glucose reference range applies only to samples taken after fasting for at least 8 hours.   BUN 12 6 - 20 mg/dL   Creatinine, Ser 0.72 0.44 - 1.00 mg/dL   Calcium 9.6 8.9 - 10.3 mg/dL   Total Protein 7.6 6.5 - 8.1 g/dL   Albumin 4.3 3.5 - 5.0 g/dL   AST 9 (L) 15 - 41 U/L   ALT 5 0 - 44 U/L   Alkaline Phosphatase 66 38 - 126 U/L   Total Bilirubin 0.9 0.3 - 1.2 mg/dL   GFR, Estimated >60 >60 mL/min    Comment: (NOTE) Calculated using the CKD-EPI Creatinine Equation (2021)    Anion gap 14 5 - 15    Comment: Performed at [facility], [address], [place],  [address]  CBC with Differential     Status: Abnormal   Collection Time: 05/23/22  1:16 PM  Result Value Ref Range   WBC 9.7 4.0 - 10.5 K/uL   RBC 4.50 3.87 - 5.11 MIL/uL   Hemoglobin 13.3 12.0 - 15.0 g/dL   HCT 39.1 36.0 - 46.0 %   MCV 86.9 80.0 - 100.0 fL   MCH 29.6 26.0 - 34.0 pg   MCHC 34.0 30.0 - 36.0 g/dL   RDW 13.3 11.5 - 15.5 %   Platelets 202 150 - 400 K/uL   nRBC 0.0 0.0 - 0.2 %   Neutrophils Relative % 83 %   Neutro Abs 8.0 (H) 1.7 - 7.7 K/uL   Lymphocytes Relative 7 %   Lymphs Abs 0.7 0.7 - 4.0 K/uL   Monocytes Relative 10 %   Monocytes Absolute 1.0 0.1 - 1.0 K/uL   Eosinophils Relative 0 %   Eosinophils Absolute 0.0 0.0 - 0.5 K/uL   Basophils Relative 0 %   Basophils Absolute 0.0 0.0 - 0.1 K/uL    Immature Granulocytes 0 %   Abs Immature Granulocytes 0.04 0.00 - 0.07 K/uL    Comment: Performed at [facility], [place], [place] [address]  Urinalysis, Routine w reflex microscopic -Urine, Clean Catch     Status: Abnormal   Collection Time: 05/23/22  1:16 PM  Result Value Ref Range   Color, Urine YELLOW YELLOW   APPearance CLEAR CLEAR   Specific Gravity, Urine 1.035 (H) 1.005 - 1.030   pH 6.0 5.0 - 8.0   Glucose,  UA NEGATIVE NEGATIVE mg/dL   Hgb urine dipstick MODERATE (A) NEGATIVE   Bilirubin Urine SMALL (A) NEGATIVE   Ketones, ur >80 (A) NEGATIVE mg/dL   Protein, ur 30 (A) NEGATIVE mg/dL   Nitrite NEGATIVE NEGATIVE   Leukocytes,Ua SMALL (A) NEGATIVE   RBC / HPF 0-5 0 - 5 RBC/hpf   WBC, UA 21-50 0 - 5 WBC/hpf   Bacteria, UA NONE SEEN NONE SEEN   Squamous Epithelial / HPF 0-5 0 - 5 /HPF   Mucus PRESENT     Comment: Performed at [facility], [address], [place], [place] [address]   CT ABDOMEN PELVIS W CONTRAST  Result Date: 05/23/2022 CLINICAL DATA:  Diverticulitis, complication suspected EXAM: CT ABDOMEN AND PELVIS WITH CONTRAST TECHNIQUE: Multidetector CT imaging of the abdomen and pelvis was performed using the standard protocol following bolus administration of intravenous contrast. RADIATION DOSE REDUCTION: This exam was performed according to the departmental dose-optimization program which includes automated exposure control, adjustment of the mA and/or kV according to patient size and/or use of iterative reconstruction technique. CONTRAST:  45m OMNIPAQUE IOHEXOL 300 MG/ML  SOLN COMPARISON:  04/02/2022 FINDINGS: Lower chest: Included lung bases are clear.  Heart size is normal. Hepatobiliary: Stable small partially calcified lesion at the right hepatic dome. No new focal liver abnormality. Unremarkable gallbladder. No hyperdense gallstone. No biliary dilatation. Pancreas: Unremarkable. No pancreatic ductal dilatation  or surrounding inflammatory changes. Spleen: Normal in size without focal abnormality. Adrenals/Urinary Tract: Unremarkable adrenal glands. Stable 4.0 cm right renal cyst which does not require follow-up imaging. No solid renal lesion, stone, or hydronephrosis. Urinary bladder is incompletely distended, limiting its evaluation. Stomach/Bowel: Stomach within normal limits. No dilated loops of bowel. Normal appendix in the right lower quadrant. Severe changes of colitis of the sigmoid colon where there are numerous prominent diverticula. Suspect developing intramural phlegmon/abscess involving the sigmoid colon at this location measuring approximately 4.5 x 0.9 x 1.8 cm (series 2, image 68). Similar degree of fat stranding and adjacent free fluid. No organized extramural collections. No extraluminal air. Vascular/Lymphatic: No significant vascular findings are present. No enlarged abdominal or pelvic lymph nodes. Reproductive: Uterus and right adnexa are unremarkable. Inflammatory changes in the pelvis closely approximate the left ovary, as previously seen. Other: No ascites.  No pneumoperitoneum. Musculoskeletal: No acute or significant osseous findings. IMPRESSION: Severe changes of colitis of the sigmoid colon where there are numerous prominent diverticula, slightly worsened compared to the previous CT. Suspect developing intramural phlegmon/abscess involving the sigmoid colon at this location measuring approximately 4.5 x 0.9 x 1.8 cm. No organized extramural collections. No extraluminal air. Electronically Signed   By: NDavina PokeD.O.   On: 05/23/2022 15:08    Anti-infectives (From admission, onward)    Start     Dose/Rate Route Frequency Ordered Stop   05/23/22 2200  piperacillin-tazobactam (ZOSYN) IVPB 3.375 g        3.375 g 12.5 mL/hr over 240 Minutes Intravenous Every 8 hours 05/23/22 1529     05/23/22 1530  piperacillin-tazobactam (ZOSYN) IVPB 3.375 g        3.375 g 100 mL/hr over 30 Minutes  Intravenous  Once 05/23/22 1528         Assessment/Plan Sigmoid Diverticulitis with Intramural abscess - CT w/ sigmoid diverticulitis slightly worsened compared to the previous CT in January with suspected developing intramural phlegmon/abscess of the sigmoid colon measuring 4.5 x 0.9 x 1.8 cm. No organized extramural collections. No extraluminal air. - Afebrile. Tachycardia resolved. No  hypotension. WBC wnl at 9.7. No peritonitis on exam. No current indication for emergency surgery - Would not recommend IR drainage given phlegmon/abscess appears intramural  - Agree with IV antibiotics - Keep NPO - Hopefully patient will improve with conservative treatment.  If patient fails to improve they may require repeating imaging, drain placement, or surgical intervention resulting in a colectomy/colostomy.  This was discussed with the patient. - If patient improves with conservative therapies, may need to consider colonoscopy in 4-6 weeks. Her last colonoscopy in 2016 at [facility] showed diverticulosis and 3 small ulcers in her terminal ileum with bx results non-specific.    - We will follow with you  FEN - NPO, IVF VTE - SCDs, please hold Xarelto. Okay for therapeutic lovenox or heparin gtt.  ID - Zosyn Dispo - Admit to [facility].   I reviewed {Reviewed data:26882::"last 24 h vitals and pain scores","last 48 h intake and output","last 24 h labs and trends","last 24 h imaging results"}.  This care required {MDM levels:26883} level of medical decision making.   ***, [facility] Surgery 05/23/2022, 4:01 PM Please see Amion for pager number during day hours 7:00am-4:30pm

## 2022-05-23 NOTE — ED Notes (Signed)
Lauren at [facility] will send transport for Bed Ready at [place] Room#[id number].-ABB(NS)

## 2022-05-24 DIAGNOSIS — K5792 Diverticulitis of intestine, part unspecified, without perforation or abscess without bleeding: Secondary | ICD-10-CM | POA: Diagnosis not present

## 2022-05-24 LAB — BASIC METABOLIC PANEL
Anion gap: 11 (ref 5–15)
BUN/Creatinine Ratio: 14 (ref 9–23)
BUN: 11 mg/dL (ref 6–24)
BUN: 8 mg/dL (ref 6–20)
CO2: 22 mmol/L (ref 20–29)
CO2: 22 mmol/L (ref 22–32)
Calcium: 9.2 mg/dL (ref 8.7–10.2)
Calcium: 8 mg/dL — ABNORMAL LOW (ref 8.9–10.3)
Chloride: 98 mmol/L (ref 96–106)
Chloride: 101 mmol/L (ref 98–111)
Creatinine, Ser: 0.77 mg/dL (ref 0.57–1.00)
Creatinine, Ser: 0.65 mg/dL (ref 0.44–1.00)
GFR, Estimated: >60 mL/min (ref >60)
Glucose, Bld: 113 mg/dL — ABNORMAL HIGH (ref 70–99)
Glucose: 97 mg/dL (ref 70–99)
Potassium: 3.9 mmol/L (ref 3.5–5.2)
Potassium: 3.2 mmol/L — ABNORMAL LOW (ref 3.5–5.1)
Sodium: 137 mmol/L (ref 134–144)
Sodium: 134 mmol/L — ABNORMAL LOW (ref 135–145)
eGFR: 90 mL/min/{1.73_m2} (ref >59)

## 2022-05-24 LAB — CBC
HCT: 32.4 % — ABNORMAL LOW (ref 36.0–46.0)
Hemoglobin: 10.9 g/dL — ABNORMAL LOW (ref 12.0–15.0)
MCH: 29.3 pg (ref 26.0–34.0)
MCHC: 33.6 g/dL (ref 30.0–36.0)
MCV: 87.1 fL (ref 80.0–100.0)
Platelets: 157 10*3/uL (ref 150–400)
RBC: 3.72 MIL/uL — ABNORMAL LOW (ref 3.87–5.11)
RDW: 13.2 % (ref 11.5–15.5)
WBC: 7.5 10*3/uL (ref 4.0–10.5)
nRBC: 0 % (ref 0.0–0.2)

## 2022-05-24 LAB — GLUCOSE, CAPILLARY: Glucose-Capillary: 117 mg/dL — ABNORMAL HIGH (ref 70–99)

## 2022-05-24 LAB — HIV ANTIBODY (ROUTINE TESTING W REFLEX): HIV Screen 4th Generation wRfx: NONREACTIVE

## 2022-05-24 LAB — LIPID PANEL
Chol/HDL Ratio: 2.4 ratio (ref 0.0–4.4)
Cholesterol, Total: 175 mg/dL (ref 100–199)
HDL: 73 mg/dL (ref >39)
LDL Chol Calc (NIH): 90 mg/dL (ref 0–99)
Triglycerides: 61 mg/dL (ref 0–149)
VLDL Cholesterol Cal: 12 mg/dL (ref 5–40)

## 2022-05-24 LAB — MAGNESIUM: Magnesium: 1.9 mg/dL (ref 1.7–2.4)

## 2022-05-24 LAB — HEPARIN LEVEL (UNFRACTIONATED)
Heparin Unfractionated: 0.25 IU/mL — ABNORMAL LOW (ref 0.30–0.70)
Heparin Unfractionated: 0.34 IU/mL (ref 0.30–0.70)
Heparin Unfractionated: 0.21 IU/mL — ABNORMAL LOW (ref 0.30–0.70)

## 2022-05-24 LAB — TSH: TSH: 0.97 u[IU]/mL (ref 0.450–4.500)

## 2022-05-24 LAB — VITAMIN D 25 HYDROXY (VIT D DEFICIENCY, FRACTURES): Vit D, 25-Hydroxy: 39.7 ng/mL (ref 30.0–100.0)

## 2022-05-24 MED ORDER — HEPARIN BOLUS VIA INFUSION
2000.0000 [IU] | Freq: Once | INTRAVENOUS | Status: AC
  Administered 2022-05-24: 2000 [IU] via INTRAVENOUS
  Filled 2022-05-24: qty 2000

## 2022-05-24 MED ORDER — SODIUM CHLORIDE 0.9 % IV SOLN
INTRAVENOUS | Status: DC
  Administered 2022-05-24: 75 mL/h via INTRAVENOUS

## 2022-05-24 MED ORDER — POTASSIUM CHLORIDE 20 MEQ PO PACK
40.0000 meq | PACK | Freq: Once | ORAL | Status: AC
  Administered 2022-05-24: 40 meq via ORAL
  Filled 2022-05-24: qty 2

## 2022-05-24 MED ORDER — POTASSIUM CHLORIDE 10 MEQ/100ML IV SOLN: 10.0000 meq | INTRAVENOUS | Status: DC

## 2022-05-24 NOTE — Progress Notes (Signed)
Subjective: CC: LLQ abdominal pain improved from 7/10 on presentation to 5/10 today. Only has had small sips of cld. No worsening abdominal pain with po intake. Denies n/v. No flatus. Last bm 2/20.  PMhx: DVT on Xarelto (last dose 2/20 pm), breast cancer s/p lumpectomy by Dr. Donne Hazel Prior abdominal surgeries: Hernia repair (2002) Last colonoscopy: 2016 at [facility] showed diverticulosis and 3 small ulcers in her terminal ileum with bx results non-specific.     Objective: Vital signs in last 24 hours: Temp:  [98.1 F (36.7 C)-99.3 F (37.4 C)] 98.1 F (36.7 C) (02/22 0546) Pulse Rate:  [82-115] 96 (02/22 0546) Resp:  [16-18] 17 (02/22 0546) BP: (102-128)/(58-82) 121/58 (02/22 0546) SpO2:  [98 %-100 %] 98 % (02/22 0546) Weight:  [55.3 kg-55.8 kg] 55.3 kg (02/22 0546) Last BM Date : 05/22/22  Intake/Output from previous day: 02/21 0701 - 02/22 0700 In: 849.6 [I.V.:761.9; IV Piggyback:87.7] Out: 1 [Urine:1] Intake/Output this shift: No intake/output data recorded.  PE: Gen:  Alert, NAD, pleasant Abd: Soft, ND, suprapubic and LLQ ttp without rigidity or guarding, +BS Psych: A&Ox3   Lab Results:  Recent Labs    05/23/22 1316 05/24/22 0143  WBC 9.7 7.5  HGB 13.3 10.9*  HCT 39.1 32.4*  PLT 202 157   BMET Recent Labs    05/23/22 1316 05/24/22 0143  NA 137 134*  K 3.9 3.2*  CL 99 101  CO2 24 22  GLUCOSE 90 113*  BUN 12 8  CREATININE 0.72 0.65  CALCIUM 9.6 8.0*   PT/INR No results for input(s): "LABPROT", "INR" in the last 72 hours. CMP     Component Value Date/Time   NA 134 (L) 05/24/2022 0143   NA 137 05/23/2022 1122   K 3.2 (L) 05/24/2022 0143   CL 101 05/24/2022 0143   CO2 22 05/24/2022 0143   GLUCOSE 113 (H) 05/24/2022 0143   BUN 8 05/24/2022 0143   BUN 11 05/23/2022 1122   CREATININE 0.65 05/24/2022 0143   CREATININE 0.73 01/15/2022 0822   CALCIUM 8.0 (L) 05/24/2022 0143   PROT 7.6 05/23/2022 1316   PROT 6.8 08/15/2021 0959    ALBUMIN 4.3 05/23/2022 1316   ALBUMIN 4.3 08/15/2021 0959   AST 9 (L) 05/23/2022 1316   AST 12 (L) 01/15/2022 0822   ALT 5 05/23/2022 1316   ALT 7 01/15/2022 0822   ALKPHOS 66 05/23/2022 1316   BILITOT 0.9 05/23/2022 1316   BILITOT 0.7 01/15/2022 0822   GFRNONAA >60 05/24/2022 0143   GFRNONAA >60 01/15/2022 0822   GFRAA >60 12/16/2019 0810   Lipase     Component Value Date/Time   LIPASE 13 05/23/2022 1316    Studies/Results: CT ABDOMEN PELVIS W CONTRAST  Result Date: 05/23/2022 CLINICAL DATA:  Diverticulitis, complication suspected EXAM: CT ABDOMEN AND PELVIS WITH CONTRAST TECHNIQUE: Multidetector CT imaging of the abdomen and pelvis was performed using the standard protocol following bolus administration of intravenous contrast. RADIATION DOSE REDUCTION: This exam was performed according to the departmental dose-optimization program which includes automated exposure control, adjustment of the mA and/or kV according to patient size and/or use of iterative reconstruction technique. CONTRAST:  72m OMNIPAQUE IOHEXOL 300 MG/ML  SOLN COMPARISON:  04/02/2022 FINDINGS: Lower chest: Included lung bases are clear.  Heart size is normal. Hepatobiliary: Stable small partially calcified lesion at the right hepatic dome. No new focal liver abnormality. Unremarkable gallbladder. No hyperdense gallstone. No biliary dilatation. Pancreas: Unremarkable. No pancreatic ductal dilatation or  surrounding inflammatory changes. Spleen: Normal in size without focal abnormality. Adrenals/Urinary Tract: Unremarkable adrenal glands. Stable 4.0 cm right renal cyst which does not require follow-up imaging. No solid renal lesion, stone, or hydronephrosis. Urinary bladder is incompletely distended, limiting its evaluation. Stomach/Bowel: Stomach within normal limits. No dilated loops of bowel. Normal appendix in the right lower quadrant. Severe changes of colitis of the sigmoid colon where there are numerous prominent  diverticula. Suspect developing intramural phlegmon/abscess involving the sigmoid colon at this location measuring approximately 4.5 x 0.9 x 1.8 cm (series 2, image 68). Similar degree of fat stranding and adjacent free fluid. No organized extramural collections. No extraluminal air. Vascular/Lymphatic: No significant vascular findings are present. No enlarged abdominal or pelvic lymph nodes. Reproductive: Uterus and right adnexa are unremarkable. Inflammatory changes in the pelvis closely approximate the left ovary, as previously seen. Other: No ascites.  No pneumoperitoneum. Musculoskeletal: No acute or significant osseous findings. IMPRESSION: Severe changes of colitis of the sigmoid colon where there are numerous prominent diverticula, slightly worsened compared to the previous CT. Suspect developing intramural phlegmon/abscess involving the sigmoid colon at this location measuring approximately 4.5 x 0.9 x 1.8 cm. No organized extramural collections. No extraluminal air. Electronically Signed   By: Davina Poke D.O.   On: 05/23/2022 15:08    Anti-infectives: Anti-infectives (From admission, onward)    Start     Dose/Rate Route Frequency Ordered Stop   05/23/22 2200  piperacillin-tazobactam (ZOSYN) IVPB 3.375 g        3.375 g 12.5 mL/hr over 240 Minutes Intravenous Every 8 hours 05/23/22 1529     05/23/22 1530  piperacillin-tazobactam (ZOSYN) IVPB 3.375 g        3.375 g 100 mL/hr over 30 Minutes Intravenous  Once 05/23/22 1528 05/23/22 1639        Assessment/Plan Sigmoid Diverticulitis with Intramural abscess - CT w/ sigmoid diverticulitis slightly worsened compared to the previous CT in January with suspected developing intramural phlegmon/abscess of the sigmoid colon measuring 4.5 x 0.9 x 1.8 cm. No organized extramural collections. No extraluminal air. - Afebrile. Tachycardia resolved. No hypotension. WBC wnl. No peritonitis on exam. No current indication for emergency surgery - Would  not recommend IR drainage given phlegmon/abscess appears intramural  - Agree with IV antibiotics - Okay for CLD. Possibly advance to FLD this pm.  - Hopefully patient will improve with conservative treatment.  If patient fails to improve they may require repeating imaging, drain placement, or surgical intervention resulting in a colectomy/colostomy.  This was discussed with the patient. - If patient improves with conservative therapies, recomend colonoscopy in 4-6 weeks. Her last colonoscopy in 2016 at [facility] showed diverticulosis and 3 small ulcers in her terminal ileum with bx results non-specific. Will also need referral to surgeon for f/u to discuss elective resection. This is now her 3rd episode of sigmoid diverticulitis since September 2023.  - We will follow with you   FEN - CLD, IVF VTE - SCDs, please hold Xarelto (last dose 2/20 pm). On heparin gtt.  ID - Zosyn 2/21 >>  Dispo - Admit to [facility].   I reviewed nursing notes, hospitalist notes, last 24 h vitals and pain scores, last 48 h intake and output, last 24 h labs and trends, and last 24 h imaging results.   LOS: 1 day    Jillyn Ledger , [facility] Surgery 05/24/2022, 9:11 AM Please see Amion for pager number during day hours 7:00am-4:30pm

## 2022-05-24 NOTE — Progress Notes (Signed)
ANTICOAGULATION CONSULT NOTE  Pharmacy Consult for Heparin Indication: recurrent VTE  Allergies  Allergen Reactions   Hyoscyamine Anaphylaxis   Fosamax [Alendronate] Nausea Only and Other (See Comments)    SEVERE NAUSEA   Nexium [Esomeprazole] Swelling and Other (See Comments)    Tongue became swollen- breathing not affected   Doxycycline Palpitations and Other (See Comments)    Racing heart    Patient Measurements: Height: 5' 4"$  (162.6 cm) Weight: 55.3 kg (121 lb 14.6 oz) IBW/kg (Calculated) : 54.7 Heparin Dosing Weight: TBW  Vital Signs: Temp: 99.5 F (37.5 C) (02/22 1353) Temp Source: Oral (02/22 1353) BP: 116/75 (02/22 1353) Pulse Rate: 85 (02/22 1353)  Labs: Recent Labs    05/23/22 1122 05/23/22 1316 05/23/22 1836 05/23/22 1836 05/24/22 0143 05/24/22 0847 05/24/22 1445  HGB  --  13.3  --   --  10.9*  --   --   HCT  --  39.1  --   --  32.4*  --   --   PLT  --  202  --   --  157  --   --   APTT  --   --  33  --   --   --   --   HEPARINUNFRC  --   --  <0.10*   < > 0.25* 0.34 0.21*  CREATININE 0.77 0.72  --   --  0.65  --   --    < > = values in this interval not displayed.     Estimated Creatinine Clearance: 67 mL/min (by C-G formula based on SCr of 0.65 mg/dL).   Medications:  Infusions:   sodium chloride 75 mL/hr (05/24/22 1428)   heparin 950 Units/hr (05/24/22 0338)   piperacillin-tazobactam (ZOSYN)  IV 3.375 g (05/24/22 1312)    Assessment: 53 yoF with PMH breast cancer on antiestrogen therapy, on Xarelto PTA for recurrent DVT, recurrent diverticulitis, presents 2/21 with abd pain. Found to have acute sigmoid diverticulitis w/ abscess. Surgery consulted but no immediate plans for invasive procedures. Anticoagulation converted to heparin infusion with Pharmacy to dose while Xarelto on hold. Prior anticoagulation: Xarelto 20 mg daily, last dose 2/20 AM   Today, 05/24/2022: Heparin level now SUBtherapeutic on heparin 950 units/hr after previously  being at goal No pump/line/IV site issues per RN; running at correct rate CBC:  Hgb decreased to 10.9, Plt down to 157 No bleeding or complications reported No SCr this AM; previously stable WNL  Goal of Therapy:  Heparin level 0.3-0.7 units/ml Monitor platelets by anticoagulation protocol: Yes   Plan:  Increase heparin to 1150 units/hr Heparin 2000 units IV bolus x 1 Recheck heparin level 6-8 hr after rate change Daily heparin level and CBC F/u for ability to resume PTA [place], PharmD, BCPS [phone] 05/24/2022, 4:38 PM

## 2022-05-24 NOTE — Plan of Care (Signed)

## 2022-05-24 NOTE — Progress Notes (Signed)
PROGRESS NOTE    Stephanie Brewer  [id number] DOB: April 06, 1965 DOA: 05/23/2022 PCP: [name], DO   Brief Narrative:  57 y.o. female with medical history significant of breast cancer on anastrozole, recurrent DVT on Xarelto, GERD, history of recurrent diverticulitis comes to the hospital with abdominal pain.  Patient stated she started having lower abdominal discomfort and subjective fevers and chills about 4 days ago which progressed throughout the week.  Patient admitted again with recurrent episode of acute sigmoid diverticulitis with abscess.  Currently on IV antibiotics, general surgery recommending conservative management.   Assessment & Plan:  Principal Problem:   Diverticulitis Active Problems:   GERD (gastroesophageal reflux disease)   H/O bilateral breast implants   Malignant neoplasm of upper-outer quadrant of right breast in female, estrogen receptor positive ([place])     Recurrent acute sigmoid diverticulitis complicated by abscess - Seen by general surgery.  For now conservative management.  Continue IV antibiotics.  If fails to improve, she may require further IR drainage versus surgical intervention.  Will need colonoscopy down the road    History of recurrent DVT - Diagnosed in 2021 in 2022.  Hold outpatient Xarelto, heparin drip   History of breast cancer - Follows outpatient [name].  Outpatient anastrozole on hold     DVT prophylaxis: Heparin Drip Code Status: Full Code Family Communication:    Status is: Inpatient Remains inpatient appropriate because: Cont Iv abx   Subjective: Doing ok no complaints.    Examination:  Constitutional: Not in acute distress Respiratory: Clear to auscultation bilaterally Cardiovascular: Normal sinus rhythm, no rubs Abdomen: tender in lower abd Musculoskeletal: No edema noted Skin: No rashes seen Neurologic: CN 2-12 grossly intact.  And nonfocal Psychiatric: Normal judgment and insight. Alert and  oriented x 3. Normal mood.      Objective: Vitals:   05/23/22 1714 05/23/22 2144 05/24/22 0233 05/24/22 0546  BP: 108/72 119/66 128/61 (!) 121/58  Pulse: 83 82 97 96  Resp: 18 18 18 17  $ Temp: 98.3 F (36.8 C) 98.2 F (36.8 C) 99.3 F (37.4 C) 98.1 F (36.7 C)  TempSrc:      SpO2: 100% 100% 100% 98%  Weight:    55.3 kg  Height:        Intake/Output Summary (Last 24 hours) at 05/24/2022 0918 Last data filed at 05/24/2022 0345 Gross per 24 hour  Intake 849.57 ml  Output 1 ml  Net 848.57 ml   Filed Weights   05/23/22 1707 05/24/22 0546  Weight: 55.8 kg 55.3 kg     Data Reviewed:   CBC: Recent Labs  Lab 05/23/22 1316 05/24/22 0143  WBC 9.7 7.5  NEUTROABS 8.0*  --   HGB 13.3 10.9*  HCT 39.1 32.4*  MCV 86.9 87.1  PLT 202 [id number]   Basic Metabolic Panel: Recent Labs  Lab 05/23/22 1122 05/23/22 1316 05/24/22 0143  NA 137 137 134*  K 3.9 3.9 3.2*  CL 98 99 101  CO2 22 24 22  $ GLUCOSE 97 90 113*  BUN 11 12 8  $ CREATININE 0.77 0.72 0.65  CALCIUM 9.2 9.6 8.0*  MG  --   --  1.9   GFR: Estimated Creatinine Clearance: 67 mL/min (by C-G formula based on SCr of 0.65 mg/dL). Liver Function Tests: Recent Labs  Lab 05/23/22 1316  AST 9*  ALT 5  ALKPHOS 66  BILITOT 0.9  PROT 7.6  ALBUMIN 4.3   Recent Labs  Lab 05/23/22 1316  LIPASE 13  No results for input(s): "AMMONIA" in the last 168 hours. Coagulation Profile: No results for input(s): "INR", "PROTIME" in the last 168 hours. Cardiac Enzymes: No results for input(s): "CKTOTAL", "CKMB", "CKMBINDEX", "TROPONINI" in the last 168 hours. BNP (last 3 results) No results for input(s): "PROBNP" in the last 8760 hours. HbA1C: Recent Labs    05/23/22 1121  HGBA1C 6.3*   CBG: Recent Labs  Lab 05/23/22 2342 05/24/22 0549  GLUCAP 112* 117*   Lipid Profile: Recent Labs    05/23/22 1122  CHOL 175  HDL 73  LDLCALC 90  TRIG 61  CHOLHDL 2.4   Thyroid Function Tests: Recent Labs    05/23/22 1122   TSH 0.970   Anemia Panel: No results for input(s): "VITAMINB12", "FOLATE", "FERRITIN", "TIBC", "IRON", "RETICCTPCT" in the last 72 hours. Sepsis Labs: Recent Labs  Lab 05/23/22 1836  LATICACIDVEN 0.8    No results found for this or any previous visit (from the past 240 hour(s)).       Radiology Studies: CT ABDOMEN PELVIS W CONTRAST  Result Date: 05/23/2022 CLINICAL DATA:  Diverticulitis, complication suspected EXAM: CT ABDOMEN AND PELVIS WITH CONTRAST TECHNIQUE: Multidetector CT imaging of the abdomen and pelvis was performed using the standard protocol following bolus administration of intravenous contrast. RADIATION DOSE REDUCTION: This exam was performed according to the departmental dose-optimization program which includes automated exposure control, adjustment of the mA and/or kV according to patient size and/or use of iterative reconstruction technique. CONTRAST:  7m OMNIPAQUE IOHEXOL 300 MG/ML  SOLN COMPARISON:  04/02/2022 FINDINGS: Lower chest: Included lung bases are clear.  Heart size is normal. Hepatobiliary: Stable small partially calcified lesion at the right hepatic dome. No new focal liver abnormality. Unremarkable gallbladder. No hyperdense gallstone. No biliary dilatation. Pancreas: Unremarkable. No pancreatic ductal dilatation or surrounding inflammatory changes. Spleen: Normal in size without focal abnormality. Adrenals/Urinary Tract: Unremarkable adrenal glands. Stable 4.0 cm right renal cyst which does not require follow-up imaging. No solid renal lesion, stone, or hydronephrosis. Urinary bladder is incompletely distended, limiting its evaluation. Stomach/Bowel: Stomach within normal limits. No dilated loops of bowel. Normal appendix in the right lower quadrant. Severe changes of colitis of the sigmoid colon where there are numerous prominent diverticula. Suspect developing intramural phlegmon/abscess involving the sigmoid colon at this location measuring approximately  4.5 x 0.9 x 1.8 cm (series 2, image 68). Similar degree of fat stranding and adjacent free fluid. No organized extramural collections. No extraluminal air. Vascular/Lymphatic: No significant vascular findings are present. No enlarged abdominal or pelvic lymph nodes. Reproductive: Uterus and right adnexa are unremarkable. Inflammatory changes in the pelvis closely approximate the left ovary, as previously seen. Other: No ascites.  No pneumoperitoneum. Musculoskeletal: No acute or significant osseous findings. IMPRESSION: Severe changes of colitis of the sigmoid colon where there are numerous prominent diverticula, slightly worsened compared to the previous CT. Suspect developing intramural phlegmon/abscess involving the sigmoid colon at this location measuring approximately 4.5 x 0.9 x 1.8 cm. No organized extramural collections. No extraluminal air. Electronically Signed   By: NDavina PokeD.O.   On: 05/23/2022 15:08        Scheduled Meds: Continuous Infusions:  dextrose 5 % and 0.45% NaCl 75 mL/hr (05/24/22 0739)   heparin 950 Units/hr (05/24/22 0338)   piperacillin-tazobactam (ZOSYN)  IV 3.375 g (05/24/22 0536)   potassium chloride       LOS: 1 day   Time spent= 35 mins    Shahed Yeoman CArsenio Loader MD [facility]  If 7PM-7AM,  please contact night-coverage  05/24/2022, 9:18 AM

## 2022-05-24 NOTE — TOC CM/SW Note (Signed)
Transition of Care [facility]) Screening Note  Patient Details  Name: Stephanie Brewer Date of Birth: 11/11/1965  Transition of Care [facility]) CM/SW Contact:    Sherie Don, LCSW Phone Number: 05/24/2022, 9:30 AM  Transition of Care Department [facility]) has reviewed patient and no TOC needs have been identified at this time. We will continue to monitor patient advancement through interdisciplinary progression rounds. If new patient transition needs arise, please place a TOC consult.

## 2022-05-24 NOTE — Progress Notes (Signed)
ANTICOAGULATION CONSULT NOTE  Pharmacy Consult for IV heparin Indication: recurrent VTE  Allergies  Allergen Reactions   Hyoscyamine Anaphylaxis   Fosamax [Alendronate] Nausea Only and Other (See Comments)    SEVERE NAUSEA   Nexium [Esomeprazole] Swelling and Other (See Comments)    Tongue became swollen- breathing not affected   Doxycycline Palpitations and Other (See Comments)    Racing heart    Patient Measurements: Height: 5' 4"$  (162.6 cm) Weight: 55.8 kg (122 lb 15.9 oz) IBW/kg (Calculated) : 54.7 Heparin Dosing Weight: TBW  Vital Signs: Temp: 98.2 F (36.8 C) (02/21 2144) Temp Source: Oral (02/21 1624) BP: 119/66 (02/21 2144) Pulse Rate: 82 (02/21 2144)  Labs: Recent Labs    05/23/22 1122 05/23/22 1316 05/23/22 1836 05/24/22 0143  HGB  --  13.3  --  10.9*  HCT  --  39.1  --  32.4*  PLT  --  202  --  157  APTT  --   --  33  --   HEPARINUNFRC  --   --  <0.10* 0.25*  CREATININE 0.77 0.72  --  0.65     Estimated Creatinine Clearance: 67 mL/min (by C-G formula based on SCr of 0.65 mg/dL).   Medical History: Past Medical History:  Diagnosis Date   Cancer ([place])    Displacement of breast implant 12/24/2017   DVT (deep venous thrombosis) ([facility])    left leg knee and ankle    GERD (gastroesophageal reflux disease)     Medications:  Medications Prior to Admission  Medication Sig Dispense Refill Last Dose   anastrozole (ARIMIDEX) 1 MG tablet TAKE ONE TABLET ONCE DAILY (Patient taking differently: Take 1 mg by mouth at bedtime.) 90 tablet 3 05/22/2022 at pm   ondansetron (ZOFRAN) 4 MG tablet Take 1 tablet (4 mg total) by mouth every 6 (six) hours as needed for nausea or vomiting. 20 tablet 0 unk   Semaglutide-Weight Management (WEGOVY) 2.4 MG/0.75ML SOAJ Inject 2.4 mg into the skin every 7 (seven) days. (Patient taking differently: Inject 2.4 mg into the skin See admin instructions. Inject 2.4 mg into the skin every 7-28 days) 9 mL 3 05/19/2022   TYLENOL 325 MG  tablet Take 325 mg by mouth every 6 (six) hours as needed for headache or mild pain.   unk   XARELTO 20 MG TABS tablet TAKE ONE TABLET ONCE DAILY WITH SUPPER (Patient taking differently: Take 20 mg by mouth in the morning.) 30 tablet 12 05/23/2022 at 0700   alendronate (FOSAMAX) 70 MG tablet Take 1 tablet (70 mg total) by mouth once a week. Take with a full glass of water on an empty stomach. (Patient not taking: Reported on 05/23/2022) 12 tablet 3 Not Taking   calcium-vitamin D (OSCAL WITH D) 500-200 MG-UNIT tablet Take 1 tablet by mouth in the morning and at bedtime. (Patient not taking: Reported on 05/23/2022)   Not Taking   glucose blood test strip UAD to check sugar. R73.03 100 each 12    Lancets Thin MISC UAD to check sugar. R73.03 100 each 12    ondansetron (ZOFRAN) 4 MG tablet Take 1 tablet (4 mg total) by mouth every 6 (six) hours. (Patient not taking: Reported on 05/23/2022) 12 tablet 0 Not Taking   Scheduled:    PRN: acetaminophen, guaiFENesin, hydrALAZINE, HYDROmorphone (DILAUDID) injection, ipratropium-albuterol, metoprolol tartrate, ondansetron (ZOFRAN) IV, senna-docusate, traZODone  Assessment: 9 yoF with PMH breast cancer on antiestrogen therapy, on Xarelto PTA for recurrent DVT, recurrent diverticulitis, presents 2/21 with  abd pain. Found to have acute sigmoid diverticulitis w/ abscess. Surgery consulted but no immediate plans for invasive procedures. Anticoagulation converted to heparin infusion with Pharmacy to dose while Xarelto on hold.  Baseline aPTT: 33 Baseline heparin level: <0.1 Prior anticoagulation: Xarelto 20 mg daily, last dose 2/20 AM  Significant events:  Today, 05/24/2022: HL 0.25 subtherapeutic on 800 units/hr Hgb 10.9, plts WNL No bleeding noted per RN  Goal of Therapy: Heparin level 0.3-0.7 units/ml Monitor platelets by anticoagulation protocol: Yes  Plan: Increase heparin drip to 950 units/hr Heparin level in 6 hours Daily CBC & heparin  level; Monitor for signs of bleeding or thrombosis  Dolly Rias RPh 05/24/2022, 2:12 AM

## 2022-05-24 NOTE — Progress Notes (Signed)
ANTICOAGULATION CONSULT NOTE - Follow Up Consult  Pharmacy Consult for Heparin Indication: recurrent VTE  Allergies  Allergen Reactions   Hyoscyamine Anaphylaxis   Fosamax [Alendronate] Nausea Only and Other (See Comments)    SEVERE NAUSEA   Nexium [Esomeprazole] Swelling and Other (See Comments)    Tongue became swollen- breathing not affected   Doxycycline Palpitations and Other (See Comments)    Racing heart    Patient Measurements: Height: 5' 4"$  (162.6 cm) Weight: 55.3 kg (121 lb 14.6 oz) IBW/kg (Calculated) : 54.7 Heparin Dosing Weight: TBW  Vital Signs: Temp: 98.1 F (36.7 C) (02/22 0546) BP: 121/58 (02/22 0546) Pulse Rate: 96 (02/22 0546)  Labs: Recent Labs    05/23/22 1122 05/23/22 1316 05/23/22 1836 05/24/22 0143 05/24/22 0847  HGB  --  13.3  --  10.9*  --   HCT  --  39.1  --  32.4*  --   PLT  --  202  --  157  --   APTT  --   --  33  --   --   HEPARINUNFRC  --   --  <0.10* 0.25* 0.34  CREATININE 0.77 0.72  --  0.65  --     Estimated Creatinine Clearance: 67 mL/min (by C-G formula based on SCr of 0.65 mg/dL).   Medications:  Infusions:   dextrose 5 % and 0.45% NaCl 75 mL/hr (05/24/22 0739)   heparin 950 Units/hr (05/24/22 0338)   piperacillin-tazobactam (ZOSYN)  IV 3.375 g (05/24/22 0536)   potassium chloride      Assessment: 80 yoF with PMH breast cancer on antiestrogen therapy, on Xarelto PTA for recurrent DVT, recurrent diverticulitis, presents 2/21 with abd pain. Found to have acute sigmoid diverticulitis w/ abscess. Surgery consulted but no immediate plans for invasive procedures. Anticoagulation converted to heparin infusion with Pharmacy to dose while Xarelto on hold. Prior anticoagulation: Xarelto 20 mg daily, last dose 2/20 AM   Today, 05/24/2022: Heparin level 0.34, therapeutic on heparin 950 units/hr CBC:  Hgb decreased to 10.9, Plt down to 157 No bleeding or complications reported.  No IV interruptions or issues per RN.   Goal of  Therapy:  Heparin level 0.3-0.7 units/ml Monitor platelets by anticoagulation protocol: Yes   Plan:  Continue heparin IV infusion at 950 units/hr Confirmatory heparin level in 6 hours Daily heparin level and CBC   Gretta Arab PharmD, BCPS [facility] main pharmacy [phone] 05/24/2022 9:36 AM

## 2022-05-25 DIAGNOSIS — K5792 Diverticulitis of intestine, part unspecified, without perforation or abscess without bleeding: Secondary | ICD-10-CM | POA: Diagnosis not present

## 2022-05-25 LAB — HEPARIN LEVEL (UNFRACTIONATED)
Heparin Unfractionated: 0.31 IU/mL (ref 0.30–0.70)
Heparin Unfractionated: 0.48 IU/mL (ref 0.30–0.70)
Heparin Unfractionated: 0.73 IU/mL — ABNORMAL HIGH (ref 0.30–0.70)

## 2022-05-25 LAB — BASIC METABOLIC PANEL
Anion gap: 7 (ref 5–15)
BUN: 6 mg/dL (ref 6–20)
CO2: 23 mmol/L (ref 22–32)
Calcium: 8 mg/dL — ABNORMAL LOW (ref 8.9–10.3)
Chloride: 107 mmol/L (ref 98–111)
Creatinine, Ser: 0.61 mg/dL (ref 0.44–1.00)
GFR, Estimated: >60 mL/min (ref >60)
Glucose, Bld: 91 mg/dL (ref 70–99)
Potassium: 3.9 mmol/L (ref 3.5–5.1)
Sodium: 137 mmol/L (ref 135–145)

## 2022-05-25 LAB — CBC
HCT: 31.5 % — ABNORMAL LOW (ref 36.0–46.0)
Hemoglobin: 10.6 g/dL — ABNORMAL LOW (ref 12.0–15.0)
MCH: 29.8 pg (ref 26.0–34.0)
MCHC: 33.7 g/dL (ref 30.0–36.0)
MCV: 88.5 fL (ref 80.0–100.0)
Platelets: 161 10*3/uL (ref 150–400)
RBC: 3.56 MIL/uL — ABNORMAL LOW (ref 3.87–5.11)
RDW: 13.2 % (ref 11.5–15.5)
WBC: 7.2 10*3/uL (ref 4.0–10.5)
nRBC: 0 % (ref 0.0–0.2)

## 2022-05-25 LAB — MAGNESIUM: Magnesium: 2.1 mg/dL (ref 1.7–2.4)

## 2022-05-25 MED ORDER — SODIUM CHLORIDE 0.9 % IV SOLN: INTRAVENOUS | Status: AC

## 2022-05-25 MED ORDER — OXYCODONE HCL 5 MG PO TABS: 5.0000 mg | ORAL_TABLET | ORAL | Status: DC | PRN

## 2022-05-25 NOTE — Progress Notes (Signed)
ANTICOAGULATION CONSULT NOTE  Pharmacy Consult for Heparin Indication: recurrent VTE  Allergies  Allergen Reactions   Hyoscyamine Anaphylaxis   Fosamax [Alendronate] Nausea Only and Other (See Comments)    SEVERE NAUSEA   Nexium [Esomeprazole] Swelling and Other (See Comments)    Tongue became swollen- breathing not affected   Doxycycline Palpitations and Other (See Comments)    Racing heart    Patient Measurements: Height: '5\' 4"'$  (162.6 cm) Weight: 55.3 kg (121 lb 14.6 oz) IBW/kg (Calculated) : 54.7 Heparin Dosing Weight: TBW  Vital Signs: Temp: 98.6 F (37 C) (02/22 2035) Temp Source: Oral (02/22 2035) BP: 105/66 (02/22 2035) Pulse Rate: 95 (02/22 2035)  Labs: Recent Labs    05/23/22 1316 05/23/22 1836 05/23/22 1836 05/24/22 0143 05/24/22 0847 05/24/22 1445 05/25/22 0001  HGB 13.3  --   --  10.9*  --   --  10.6*  HCT 39.1  --   --  32.4*  --   --  31.5*  PLT 202  --   --  157  --   --  161  APTT  --  33  --   --   --   --   --   HEPARINUNFRC  --  <0.10*   < > 0.25* 0.34 0.21* 0.73*  CREATININE 0.72  --   --  0.65  --   --  0.61   < > = values in this interval not displayed.     Estimated Creatinine Clearance: 67 mL/min (by C-G formula based on SCr of 0.61 mg/dL).   Medications:  Infusions:   sodium chloride 75 mL/hr (05/24/22 1428)   heparin 1,150 Units/hr (05/24/22 1736)   piperacillin-tazobactam (ZOSYN)  IV 3.375 g (05/24/22 2134)    Assessment: 24 yoF with PMH breast cancer on antiestrogen therapy, on Xarelto PTA for recurrent DVT, recurrent diverticulitis, presents 2/21 with abd pain. Found to have acute sigmoid diverticulitis w/ abscess. Surgery consulted but no immediate plans for invasive procedures. Anticoagulation converted to heparin infusion with Pharmacy to dose while Xarelto on hold. Prior anticoagulation: Xarelto 20 mg daily, last dose 2/20 AM   Today, 05/25/2022: Heparin 0.73 slightly supra-therapeutic on 1150 units/hr Hgb low but  stable, plts WNL Per RN no bleeding or line issues  Goal of Therapy:  Heparin level 0.3-0.7 units/ml Monitor platelets by anticoagulation protocol: Yes   Plan:  Decrease heparin drip to 1050 units/hr Recheck heparin level 6-8 hr after rate change Daily heparin level and CBC F/u for ability to resume PTA Xarelto  Dolly Rias RPh 05/25/2022, 12:42 AM

## 2022-05-25 NOTE — Progress Notes (Signed)
Subjective: CC: LLQ abdominal pain improved from 5/10 yesterday to 3/10 today. Not requiring narcotic pain medication. Had some nausea yesterday with cld but doing better this am. Still not taking in a lot and reports no appetite. No vomiting. Passing flatus. No bm.   Objective: Vital signs in last 24 hours: Temp:  [98.6 F (37 C)-99.8 F (37.7 C)] 99.8 F (37.7 C) (02/23 0508) Pulse Rate:  [85-95] 89 (02/23 0508) Resp:  [16-18] 16 (02/23 0508) BP: (105-116)/(52-75) 116/52 (02/23 0508) SpO2:  [100 %] 100 % (02/23 0508) Weight:  [55.3 kg] 55.3 kg (02/23 0500) Last BM Date : 05/22/22  Intake/Output from previous day: 02/22 0701 - 02/23 0700 In: 1398.4 [I.V.:1242; IV Piggyback:156.4] Out: -  Intake/Output this shift: Total I/O In: 120 [P.O.:120] Out: -   PE: Gen:  Alert, NAD, pleasant Abd: Soft, ND, suprapubic and LLQ ttp that is improved from yesterday and without rigidity or guarding, +BS Psych: A&Ox3   Lab Results:  Recent Labs    05/24/22 0143 05/25/22 0001  WBC 7.5 7.2  HGB 10.9* 10.6*  HCT 32.4* 31.5*  PLT 157 161    BMET Recent Labs    05/24/22 0143 05/25/22 0001  NA 134* 137  K 3.2* 3.9  CL 101 107  CO2 22 23  GLUCOSE 113* 91  BUN 8 6  CREATININE 0.65 0.61  CALCIUM 8.0* 8.0*    PT/INR No results for input(s): "LABPROT", "INR" in the last 72 hours. CMP     Component Value Date/Time   NA 137 05/25/2022 0001   NA 137 05/23/2022 1122   K 3.9 05/25/2022 0001   CL 107 05/25/2022 0001   CO2 23 05/25/2022 0001   GLUCOSE 91 05/25/2022 0001   BUN 6 05/25/2022 0001   BUN 11 05/23/2022 1122   CREATININE 0.61 05/25/2022 0001   CREATININE 0.73 01/15/2022 0822   CALCIUM 8.0 (L) 05/25/2022 0001   PROT 7.6 05/23/2022 1316   PROT 6.8 08/15/2021 0959   ALBUMIN 4.3 05/23/2022 1316   ALBUMIN 4.3 08/15/2021 0959   AST 9 (L) 05/23/2022 1316   AST 12 (L) 01/15/2022 0822   ALT 5 05/23/2022 1316   ALT 7 01/15/2022 0822   ALKPHOS 66 05/23/2022 1316    BILITOT 0.9 05/23/2022 1316   BILITOT 0.7 01/15/2022 0822   GFRNONAA >60 05/25/2022 0001   GFRNONAA >60 01/15/2022 0822   GFRAA >60 12/16/2019 0810   Lipase     Component Value Date/Time   LIPASE 13 05/23/2022 1316    Studies/Results: CT ABDOMEN PELVIS W CONTRAST  Result Date: 05/23/2022 CLINICAL DATA:  Diverticulitis, complication suspected EXAM: CT ABDOMEN AND PELVIS WITH CONTRAST TECHNIQUE: Multidetector CT imaging of the abdomen and pelvis was performed using the standard protocol following bolus administration of intravenous contrast. RADIATION DOSE REDUCTION: This exam was performed according to the departmental dose-optimization program which includes automated exposure control, adjustment of the mA and/or kV according to patient size and/or use of iterative reconstruction technique. CONTRAST:  33m OMNIPAQUE IOHEXOL 300 MG/ML  SOLN COMPARISON:  04/02/2022 FINDINGS: Lower chest: Included lung bases are clear.  Heart size is normal. Hepatobiliary: Stable small partially calcified lesion at the right hepatic dome. No new focal liver abnormality. Unremarkable gallbladder. No hyperdense gallstone. No biliary dilatation. Pancreas: Unremarkable. No pancreatic ductal dilatation or surrounding inflammatory changes. Spleen: Normal in size without focal abnormality. Adrenals/Urinary Tract: Unremarkable adrenal glands. Stable 4.0 cm right renal cyst which does not require follow-up imaging.  No solid renal lesion, stone, or hydronephrosis. Urinary bladder is incompletely distended, limiting its evaluation. Stomach/Bowel: Stomach within normal limits. No dilated loops of bowel. Normal appendix in the right lower quadrant. Severe changes of colitis of the sigmoid colon where there are numerous prominent diverticula. Suspect developing intramural phlegmon/abscess involving the sigmoid colon at this location measuring approximately 4.5 x 0.9 x 1.8 cm (series 2, image 68). Similar degree of fat stranding and  adjacent free fluid. No organized extramural collections. No extraluminal air. Vascular/Lymphatic: No significant vascular findings are present. No enlarged abdominal or pelvic lymph nodes. Reproductive: Uterus and right adnexa are unremarkable. Inflammatory changes in the pelvis closely approximate the left ovary, as previously seen. Other: No ascites.  No pneumoperitoneum. Musculoskeletal: No acute or significant osseous findings. IMPRESSION: Severe changes of colitis of the sigmoid colon where there are numerous prominent diverticula, slightly worsened compared to the previous CT. Suspect developing intramural phlegmon/abscess involving the sigmoid colon at this location measuring approximately 4.5 x 0.9 x 1.8 cm. No organized extramural collections. No extraluminal air. Electronically Signed   By: Davina Poke D.O.   On: 05/23/2022 15:08    Anti-infectives: Anti-infectives (From admission, onward)    Start     Dose/Rate Route Frequency Ordered Stop   05/23/22 2200  piperacillin-tazobactam (ZOSYN) IVPB 3.375 g        3.375 g 12.5 mL/hr over 240 Minutes Intravenous Every 8 hours 05/23/22 1529     05/23/22 1530  piperacillin-tazobactam (ZOSYN) IVPB 3.375 g        3.375 g 100 mL/hr over 30 Minutes Intravenous  Once 05/23/22 1528 05/23/22 1639        Assessment/Plan Sigmoid Diverticulitis with Intramural abscess - CT w/ sigmoid diverticulitis slightly worsened compared to the previous CT in January with suspected developing intramural phlegmon/abscess of the sigmoid colon measuring 4.5 x 0.9 x 1.8 cm. No organized extramural collections. No extraluminal air. - No current indication for emergency surgery - Would not recommend IR drainage given phlegmon/abscess appears intramural  - Agree with IV antibiotics - Adv to FLD - Hopefully patient will improve with conservative treatment.  If patient fails to improve they may require repeating imaging, drain placement, or surgical intervention  resulting in a colectomy/colostomy.  This was discussed with the patient. - If patient improves with conservative therapies, recomend colonoscopy in 4-6 weeks. Her last colonoscopy in 2016 at [facility] showed diverticulosis and 3 small ulcers in her terminal ileum with bx results non-specific. Will also need referral to surgeon for f/u to discuss elective resection. This is now her 3rd episode of sigmoid diverticulitis since September 2023. She did report complete resolution of symptoms after each of her first 2 episodes of diverticulitis.   - We will follow with you   FEN - FLD, IVF VTE - SCDs, please hold Xarelto (last dose 2/20 pm). On heparin gtt.  ID - Zosyn 2/21 >> WBC 7.2 Dispo - Admit to [facility].   I reviewed nursing notes, hospitalist notes, last 24 h vitals and pain scores, last 48 h intake and output, last 24 h labs and trends, and last 24 h imaging results.   LOS: 2 days    Jillyn Ledger , [facility] 05/25/2022, 8:46 AM Please see Amion for pager number during day hours 7:00am-4:30pm

## 2022-05-25 NOTE — Progress Notes (Signed)
ANTICOAGULATION CONSULT NOTE - Follow Up Consult  Pharmacy Consult for Heparin Indication: recurrent VTE  Allergies  Allergen Reactions   Hyoscyamine Anaphylaxis   Fosamax [Alendronate] Nausea Only and Other (See Comments)    SEVERE NAUSEA   Nexium [Esomeprazole] Swelling and Other (See Comments)    Tongue became swollen- breathing not affected   Doxycycline Palpitations and Other (See Comments)    Racing heart    Patient Measurements: Height: '5\' 4"'$  (162.6 cm) Weight: 55.3 kg (121 lb 14.6 oz) IBW/kg (Calculated) : 54.7 Heparin Dosing Weight: TBW  Vital Signs: Temp: 99.1 F (37.3 C) (02/23 1419) Temp Source: Oral (02/23 1419) BP: 114/69 (02/23 1419) Pulse Rate: 86 (02/23 1419)  Labs: Recent Labs    05/23/22 1316 05/23/22 1836 05/23/22 1836 05/24/22 0143 05/24/22 0847 05/25/22 0001 05/25/22 0850 05/25/22 1455  HGB 13.3  --   --  10.9*  --  10.6*  --   --   HCT 39.1  --   --  32.4*  --  31.5*  --   --   PLT 202  --   --  157  --  161  --   --   APTT  --  33  --   --   --   --   --   --   HEPARINUNFRC  --  <0.10*   < > 0.25*   < > 0.73* 0.48 0.31  CREATININE 0.72  --   --  0.65  --  0.61  --   --    < > = values in this interval not displayed.     Estimated Creatinine Clearance: 67 mL/min (by C-G formula based on SCr of 0.61 mg/dL).   Medications:  Infusions:   sodium chloride 75 mL/hr at 05/25/22 0926   heparin 1,050 Units/hr (05/25/22 1627)   piperacillin-tazobactam (ZOSYN)  IV 3.375 g (05/25/22 1337)    Assessment: 55 yoF with PMH breast cancer on antiestrogen therapy, on Xarelto PTA for recurrent DVT, recurrent diverticulitis, presents 2/21 with abd pain. Found to have acute sigmoid diverticulitis w/ abscess. Surgery consulted but no immediate plans for invasive procedures. Anticoagulation converted to heparin infusion with Pharmacy to dose while Xarelto on hold. Prior anticoagulation: Xarelto 20 mg daily, last dose 2/20 AM   Today,  05/25/2022: Confirmatory Heparin level remains therapeutic but lower on 1050 units/hr CBC:  Hgb decreased to 10.6, Plt 161k SCr stable at baseline No bleeding or complications reported. No IV interruptions or issues per RN.   Goal of Therapy:  Heparin level 0.3-0.7 units/ml Monitor platelets by anticoagulation protocol: Yes   Plan:  Increase IV heparin to 1100 units/hr (previously SUPRAtherapeutic on 1150 units/hr) Recheck heparin level with AM labs Daily heparin level and CBC F/u for ability to resume PTA Luna, PharmD, BCPS [phone] 05/25/2022, 5:52 PM

## 2022-05-25 NOTE — Progress Notes (Signed)
PROGRESS NOTE    Stephanie Brewer  [id number] DOB: 1965/06/01 DOA: 05/23/2022 PCP: [name], DO   Brief Narrative:  57 y.o. female with medical history significant of breast cancer on anastrozole, recurrent DVT on Xarelto, GERD, history of recurrent diverticulitis comes to the hospital with abdominal pain.  Patient stated she started having lower abdominal discomfort and subjective fevers and chills about 4 days ago which progressed throughout the week.  Patient admitted again with recurrent episode of acute sigmoid diverticulitis with abscess.  Currently on IV antibiotics, general surgery recommending conservative management.   Assessment & Plan:  Principal Problem:   Diverticulitis Active Problems:   GERD (gastroesophageal reflux disease)   H/O bilateral breast implants   Malignant neoplasm of upper-outer quadrant of right breast in female, estrogen receptor positive ([place])     Recurrent acute sigmoid diverticulitis complicated by abscess - Seen by general surgery.  Patient slowly continues to improve with conservative management at this time.  No plans for surgical intervention at this point.  Will certainly need colonoscopy in next 2-3 months.    History of recurrent DVT - Diagnosed in 2021 in 2022.  Hold outpatient Xarelto, heparin drip   History of breast cancer - Follows outpatient [name].  Outpatient anastrozole on hold     DVT prophylaxis: Heparin Drip Code Status: Full Code Family Communication: She will update her husband  Status is: Inpatient Remains inpatient appropriate because: Cont Iv abx.  Slowly advance diet as tolerated   Subjective: No complaints, feeling little better compared to yesterday Examination: Constitutional: Not in acute distress Respiratory: Clear to auscultation bilaterally Cardiovascular: Normal sinus rhythm, no rubs Abdomen: Tenderness to palpation in the lower abdomen Musculoskeletal: No edema noted Skin: No rashes  seen Neurologic: CN 2-12 grossly intact.  And nonfocal Psychiatric: Normal judgment and insight. Alert and oriented x 3. Normal mood.   Objective: Vitals:   05/24/22 1353 05/24/22 2035 05/25/22 0500 05/25/22 0508  BP: 116/75 105/66  (!) 116/52  Pulse: 85 95  89  Resp: '18 18  16  '$ Temp: 99.5 F (37.5 C) 98.6 F (37 C)  99.8 F (37.7 C)  TempSrc: Oral Oral  Oral  SpO2: 100% 100%  100%  Weight:   55.3 kg   Height:        Intake/Output Summary (Last 24 hours) at 05/25/2022 0853 Last data filed at 05/25/2022 0800 Gross per 24 hour  Intake 1518.43 ml  Output --  Net 1518.43 ml   Filed Weights   05/23/22 1707 05/24/22 0546 05/25/22 0500  Weight: 55.8 kg 55.3 kg 55.3 kg     Data Reviewed:   CBC: Recent Labs  Lab 05/23/22 1316 05/24/22 0143 05/25/22 0001  WBC 9.7 7.5 7.2  NEUTROABS 8.0*  --   --   HGB 13.3 10.9* 10.6*  HCT 39.1 32.4* 31.5*  MCV 86.9 87.1 88.5  PLT 202 157 [id number]   Basic Metabolic Panel: Recent Labs  Lab 05/23/22 1122 05/23/22 1316 05/24/22 0143 05/25/22 0001  NA 137 137 134* 137  K 3.9 3.9 3.2* 3.9  CL 98 99 101 107  CO2 '22 24 22 23  '$ GLUCOSE 97 90 113* 91  BUN '11 12 8 6  '$ CREATININE 0.77 0.72 0.65 0.61  CALCIUM 9.2 9.6 8.0* 8.0*  MG  --   --  1.9 2.1   GFR: Estimated Creatinine Clearance: 67 mL/min (by C-G formula based on SCr of 0.61 mg/dL). Liver Function Tests: Recent Labs  Lab 05/23/22 1316  AST 9*  ALT 5  ALKPHOS 66  BILITOT 0.9  PROT 7.6  ALBUMIN 4.3   Recent Labs  Lab 05/23/22 1316  LIPASE 13   No results for input(s): "AMMONIA" in the last 168 hours. Coagulation Profile: No results for input(s): "INR", "PROTIME" in the last 168 hours. Cardiac Enzymes: No results for input(s): "CKTOTAL", "CKMB", "CKMBINDEX", "TROPONINI" in the last 168 hours. BNP (last 3 results) No results for input(s): "PROBNP" in the last 8760 hours. HbA1C: Recent Labs    05/23/22 1121  HGBA1C 6.3*   CBG: Recent Labs  Lab 05/23/22 2342  05/24/22 0549  GLUCAP 112* 117*   Lipid Profile: Recent Labs    05/23/22 1122  CHOL 175  HDL 73  LDLCALC 90  TRIG 61  CHOLHDL 2.4   Thyroid Function Tests: Recent Labs    05/23/22 1122  TSH 0.970   Anemia Panel: No results for input(s): "VITAMINB12", "FOLATE", "FERRITIN", "TIBC", "IRON", "RETICCTPCT" in the last 72 hours. Sepsis Labs: Recent Labs  Lab 05/23/22 1836  LATICACIDVEN 0.8    No results found for this or any previous visit (from the past 240 hour(s)).       Radiology Studies: CT ABDOMEN PELVIS W CONTRAST  Result Date: 05/23/2022 CLINICAL DATA:  Diverticulitis, complication suspected EXAM: CT ABDOMEN AND PELVIS WITH CONTRAST TECHNIQUE: Multidetector CT imaging of the abdomen and pelvis was performed using the standard protocol following bolus administration of intravenous contrast. RADIATION DOSE REDUCTION: This exam was performed according to the departmental dose-optimization program which includes automated exposure control, adjustment of the mA and/or kV according to patient size and/or use of iterative reconstruction technique. CONTRAST:  64m OMNIPAQUE IOHEXOL 300 MG/ML  SOLN COMPARISON:  04/02/2022 FINDINGS: Lower chest: Included lung bases are clear.  Heart size is normal. Hepatobiliary: Stable small partially calcified lesion at the right hepatic dome. No new focal liver abnormality. Unremarkable gallbladder. No hyperdense gallstone. No biliary dilatation. Pancreas: Unremarkable. No pancreatic ductal dilatation or surrounding inflammatory changes. Spleen: Normal in size without focal abnormality. Adrenals/Urinary Tract: Unremarkable adrenal glands. Stable 4.0 cm right renal cyst which does not require follow-up imaging. No solid renal lesion, stone, or hydronephrosis. Urinary bladder is incompletely distended, limiting its evaluation. Stomach/Bowel: Stomach within normal limits. No dilated loops of bowel. Normal appendix in the right lower quadrant. Severe  changes of colitis of the sigmoid colon where there are numerous prominent diverticula. Suspect developing intramural phlegmon/abscess involving the sigmoid colon at this location measuring approximately 4.5 x 0.9 x 1.8 cm (series 2, image 68). Similar degree of fat stranding and adjacent free fluid. No organized extramural collections. No extraluminal air. Vascular/Lymphatic: No significant vascular findings are present. No enlarged abdominal or pelvic lymph nodes. Reproductive: Uterus and right adnexa are unremarkable. Inflammatory changes in the pelvis closely approximate the left ovary, as previously seen. Other: No ascites.  No pneumoperitoneum. Musculoskeletal: No acute or significant osseous findings. IMPRESSION: Severe changes of colitis of the sigmoid colon where there are numerous prominent diverticula, slightly worsened compared to the previous CT. Suspect developing intramural phlegmon/abscess involving the sigmoid colon at this location measuring approximately 4.5 x 0.9 x 1.8 cm. No organized extramural collections. No extraluminal air. Electronically Signed   By: NDavina PokeD.O.   On: 05/23/2022 15:08        Scheduled Meds: Continuous Infusions:  sodium chloride 75 mL/hr at 05/25/22 0540   heparin 1,050 Units/hr (05/25/22 0050)   piperacillin-tazobactam (ZOSYN)  IV 12.5 mL/hr at 05/25/22 0540  LOS: 2 days   Time spent= 35 mins    Reyna Lorenzi Arsenio Loader, MD [facility]  If 7PM-7AM, please contact night-coverage  05/25/2022, 8:53 AM

## 2022-05-25 NOTE — Progress Notes (Signed)
ANTICOAGULATION CONSULT NOTE - Follow Up Consult  Pharmacy Consult for Heparin Indication: recurrent VTE  Allergies  Allergen Reactions   Hyoscyamine Anaphylaxis   Fosamax [Alendronate] Nausea Only and Other (See Comments)    SEVERE NAUSEA   Nexium [Esomeprazole] Swelling and Other (See Comments)    Tongue became swollen- breathing not affected   Doxycycline Palpitations and Other (See Comments)    Racing heart    Patient Measurements: Height: '5\' 4"'$  (162.6 cm) Weight: 55.3 kg (121 lb 14.6 oz) IBW/kg (Calculated) : 54.7 Heparin Dosing Weight: TBW  Vital Signs: Temp: 99.8 F (37.7 C) (02/23 0508) Temp Source: Oral (02/23 0508) BP: 116/52 (02/23 0508) Pulse Rate: 89 (02/23 0508)  Labs: Recent Labs    05/23/22 1316 05/23/22 1836 05/23/22 1836 05/24/22 0143 05/24/22 0847 05/24/22 1445 05/25/22 0001  HGB 13.3  --   --  10.9*  --   --  10.6*  HCT 39.1  --   --  32.4*  --   --  31.5*  PLT 202  --   --  157  --   --  161  APTT  --  33  --   --   --   --   --   HEPARINUNFRC  --  <0.10*   < > 0.25* 0.34 0.21* 0.73*  CREATININE 0.72  --   --  0.65  --   --  0.61   < > = values in this interval not displayed.     Estimated Creatinine Clearance: 67 mL/min (by C-G formula based on SCr of 0.61 mg/dL).   Medications:  Infusions:   sodium chloride 75 mL/hr at 05/25/22 0540   heparin 1,050 Units/hr (05/25/22 0050)   piperacillin-tazobactam (ZOSYN)  IV 12.5 mL/hr at 05/25/22 0540    Assessment: 43 yoF with PMH breast cancer on antiestrogen therapy, on Xarelto PTA for recurrent DVT, recurrent diverticulitis, presents 2/21 with abd pain. Found to have acute sigmoid diverticulitis w/ abscess. Surgery consulted but no immediate plans for invasive procedures. Anticoagulation converted to heparin infusion with Pharmacy to dose while Xarelto on hold. Prior anticoagulation: Xarelto 20 mg daily, last dose 2/20 AM   Today, 05/25/2022: Heparin level 0.48, therapeutic on heparin 1050  units/hr CBC:  Hgb decreased to 10.6, Plt 161k No bleeding or complications reported.  No IV interruptions or issues per RN.   Goal of Therapy:  Heparin level 0.3-0.7 units/ml Monitor platelets by anticoagulation protocol: Yes   Plan:  Continue heparin IV infusion at 1050 units/hr Confirmatory heparin level in 6 hours Daily heparin level and CBC   Gretta Arab PharmD, BCPS [facility] main pharmacy [phone] 05/25/2022 8:13 AM

## 2022-05-26 DIAGNOSIS — D649 Anemia, unspecified: Secondary | ICD-10-CM | POA: Diagnosis not present

## 2022-05-26 DIAGNOSIS — Z86718 Personal history of other venous thrombosis and embolism: Secondary | ICD-10-CM

## 2022-05-26 DIAGNOSIS — K5792 Diverticulitis of intestine, part unspecified, without perforation or abscess without bleeding: Secondary | ICD-10-CM | POA: Diagnosis not present

## 2022-05-26 DIAGNOSIS — C50411 Malignant neoplasm of upper-outer quadrant of right female breast: Secondary | ICD-10-CM | POA: Diagnosis not present

## 2022-05-26 DIAGNOSIS — Z170 Estrogen receptor positive status [ER+]: Secondary | ICD-10-CM

## 2022-05-26 LAB — BASIC METABOLIC PANEL
Anion gap: 10 (ref 5–15)
BUN: 5 mg/dL — ABNORMAL LOW (ref 6–20)
CO2: 24 mmol/L (ref 22–32)
Calcium: 8.6 mg/dL — ABNORMAL LOW (ref 8.9–10.3)
Chloride: 104 mmol/L (ref 98–111)
Creatinine, Ser: 0.65 mg/dL (ref 0.44–1.00)
GFR, Estimated: >60 mL/min (ref >60)
Glucose, Bld: 95 mg/dL (ref 70–99)
Potassium: 3.6 mmol/L (ref 3.5–5.1)
Sodium: 138 mmol/L (ref 135–145)

## 2022-05-26 LAB — HEPARIN LEVEL (UNFRACTIONATED): Heparin Unfractionated: 0.57 IU/mL (ref 0.30–0.70)

## 2022-05-26 LAB — CBC
HCT: 32.6 % — ABNORMAL LOW (ref 36.0–46.0)
Hemoglobin: 10.8 g/dL — ABNORMAL LOW (ref 12.0–15.0)
MCH: 29.4 pg (ref 26.0–34.0)
MCHC: 33.1 g/dL (ref 30.0–36.0)
MCV: 88.8 fL (ref 80.0–100.0)
Platelets: 183 10*3/uL (ref 150–400)
RBC: 3.67 MIL/uL — ABNORMAL LOW (ref 3.87–5.11)
RDW: 13.2 % (ref 11.5–15.5)
WBC: 5.4 10*3/uL (ref 4.0–10.5)
nRBC: 0 % (ref 0.0–0.2)

## 2022-05-26 LAB — MAGNESIUM: Magnesium: 2.1 mg/dL (ref 1.7–2.4)

## 2022-05-26 NOTE — Assessment & Plan Note (Signed)
Mild, stable, no clinical bleeding. - Repeat with PCP in 2-4 weeks

## 2022-05-26 NOTE — Hospital Course (Signed)
Stephanie Brewer is a 57 y.o. F with hx recurrent DVT on Xarelto, hx recurrent diverticulitis, and hx BrCA on anastrozole who presented with few days fever chills, and crampy lower abdominal pain.   2/21: CT showed diverticulitis with phlegmon; Gen Surg consulted, admitted on IV antibiotics 2/24: Advanced to soft diet

## 2022-05-26 NOTE — Progress Notes (Signed)
  Progress Note   Patient: Stephanie Brewer [id number] DOB: 08/23/1965 DOA: 05/23/2022     3 DOS: the patient was seen and examined on 05/26/2022 at 12:35 PM      Brief hospital course: [name] is a 57 y.o. F with hx recurrent DVT on Xarelto, hx recurrent diverticulitis, and hx BrCA on anastrozole who presented with few days fever chills, and crampy lower abdominal pain.   2/21: CT showed diverticulitis with phlegmon; Gen Surg consulted, admitted on IV antibiotics 2/24: Advanced to soft diet     Assessment and Plan: * Recurrent acute diverticulitis with abscess/phlegmon Overall improving - Continue Zosyn - Consult Gen Surg    History of recurrent deep vein thrombosis (DVT) - Hold Xarelto - Continue heparin gtt until surgical plan clear  Normocytic anemia Mild, stable, no clinical bleeding. - Repeat with PCP in 2-4 weeks  Malignant neoplasm of upper-outer quadrant of right breast in female, estrogen receptor positive ([facility]) - Hold anastrozole          Subjective: The patient still has left lower quadrant pain, but overall is slightly improving, has not taken anything solid by mouth yet, no fever, no other new acute symptoms.     Physical Exam: BP 109/76 (BP Location: Right Leg)   Pulse 82   Temp 98.5 F (36.9 C)   Resp 18   Ht 5' 4"$  (1.626 m)   Wt 55 kg   SpO2 100%   BMI 20.81 kg/m   Elderly adult female, lying in bed, no acute distress, interactive and appropriate RRR, no murmurs, no peripheral edema Respiratory rate normal, lungs clear without rales or wheezes Abdomen some mild left lower quadrant pain, mild guarding, no rebound or rigidity Attention normal, affect appropriate, moves upper extremities with normal strength and coordination, speech fluent    Data Reviewed: Patient metabolic panel unremarkable CBC shows mild anemia  Family Communication: None present    Disposition: Status is: Inpatient Patient was admitted with  diverticulitis and phlegmon.  She will require ongoing IV antibiotics, hopefully advance diet and able to go home tomorrow or the next day        Author: Edwin Dada, MD 05/26/2022 4:06 PM  For on call review [email or web address].

## 2022-05-26 NOTE — Progress Notes (Signed)
ANTICOAGULATION CONSULT NOTE - Follow Up Consult  Pharmacy Consult for Heparin Indication: recurrent VTE  Allergies  Allergen Reactions   Hyoscyamine Anaphylaxis   Fosamax [Alendronate] Nausea Only and Other (See Comments)    SEVERE NAUSEA   Nexium [Esomeprazole] Swelling and Other (See Comments)    Tongue became swollen- breathing not affected   Doxycycline Palpitations and Other (See Comments)    Racing heart    Patient Measurements: Height: '5\' 4"'$  (162.6 cm) Weight: 55.3 kg (121 lb 14.6 oz) IBW/kg (Calculated) : 54.7 Heparin Dosing Weight: TBW  Vital Signs: Temp: 98.8 F (37.1 C) (02/24 0400) Temp Source: Oral (02/24 0400) BP: 117/52 (02/24 0400) Pulse Rate: 85 (02/24 0400)  Labs: Recent Labs    05/23/22 1316 05/23/22 1316 05/23/22 1836 05/24/22 0143 05/24/22 0847 05/25/22 0001 05/25/22 0850 05/25/22 1455 05/26/22 0431  HGB 13.3  --   --  10.9*  --  10.6*  --   --  10.8*  HCT 39.1  --   --  32.4*  --  31.5*  --   --  32.6*  PLT 202  --   --  157  --  161  --   --  183  APTT  --   --  33  --   --   --   --   --   --   HEPARINUNFRC  --    < > <0.10* 0.25*   < > 0.73* 0.48 0.31 0.57  CREATININE 0.72  --   --  0.65  --  0.61  --   --   --    < > = values in this interval not displayed.     Estimated Creatinine Clearance: 67 mL/min (by C-G formula based on SCr of 0.61 mg/dL).   Medications:  Infusions:   sodium chloride 75 mL/hr at 05/25/22 0926   heparin 1,100 Units/hr (05/25/22 1803)   piperacillin-tazobactam (ZOSYN)  IV 3.375 g (05/26/22 0458)    Assessment: 34 yoF with PMH breast cancer on antiestrogen therapy, on Xarelto PTA for recurrent DVT, recurrent diverticulitis, presents 2/21 with abd pain. Found to have acute sigmoid diverticulitis w/ abscess. Surgery consulted but no immediate plans for invasive procedures. Anticoagulation converted to heparin infusion with Pharmacy to dose while Xarelto on hold. Prior anticoagulation: Xarelto 20 mg daily,  last dose 2/20 AM   Today, 05/26/2022: HL 0.57 therapeutic on 1100 units/hr CBC: hgb 10.8, plts 183 SCr stable at baseline No bleeding per RN  Goal of Therapy:  Heparin level 0.3-0.7 units/ml Monitor platelets by anticoagulation protocol: Yes   Plan:  Continue heparin drip at 1100 units/hr Daily heparin level and CBC F/u for ability to resume PTA Xarelto   Dolly Rias RPh 05/26/2022, 5:20 AM

## 2022-05-26 NOTE — Assessment & Plan Note (Signed)
-   Hold anastrozole

## 2022-05-26 NOTE — Assessment & Plan Note (Signed)
Overall improving, but still with pain. - Continue Zosyn - Consult Gen Surg

## 2022-05-26 NOTE — Progress Notes (Signed)
   Subjective/Chief Complaint: Feels better today. Still has some LLQ tenderness.   Objective: Vital signs in last 24 hours: Temp:  [98.8 F (37.1 C)-99.1 F (37.3 C)] 98.8 F (37.1 C) (02/24 0400) Pulse Rate:  [85-93] 85 (02/24 0400) Resp:  [16] 16 (02/24 0400) BP: (108-117)/(52-69) 117/52 (02/24 0400) SpO2:  [98 %-100 %] 98 % (02/24 0400) Weight:  [55 kg] 55 kg (02/24 0500) Last BM Date : 05/22/22  Intake/Output from previous day: 02/23 0701 - 02/24 0700 In: 1625.9 [P.O.:600; I.V.:865.5; IV Piggyback:160.4] Out: -  Intake/Output this shift: No intake/output data recorded.  General appearance: alert and cooperative Resp: clear to auscultation bilaterally Cardio: regular rate and rhythm GI: soft. Still has moderate focal tenderness LLQ  Lab Results:  Recent Labs    05/25/22 0001 05/26/22 0431  WBC 7.2 5.4  HGB 10.6* 10.8*  HCT 31.5* 32.6*  PLT 161 183   BMET Recent Labs    05/25/22 0001 05/26/22 0431  NA 137 138  K 3.9 3.6  CL 107 104  CO2 23 24  GLUCOSE 91 95  BUN 6 5*  CREATININE 0.61 0.65  CALCIUM 8.0* 8.6*   PT/INR No results for input(s): "LABPROT", "INR" in the last 72 hours. ABG No results for input(s): "PHART", "HCO3" in the last 72 hours.  Invalid input(s): "PCO2", "PO2"  Studies/Results: No results found.  Anti-infectives: Anti-infectives (From admission, onward)    Start     Dose/Rate Route Frequency Ordered Stop   05/23/22 2200  piperacillin-tazobactam (ZOSYN) IVPB 3.375 g        3.375 g 12.5 mL/hr over 240 Minutes Intravenous Every 8 hours 05/23/22 1529     05/23/22 1530  piperacillin-tazobactam (ZOSYN) IVPB 3.375 g        3.375 g 100 mL/hr over 30 Minutes Intravenous  Once 05/23/22 1528 05/23/22 1639       Assessment/Plan: s/p * No surgery found * Advance diet. Allow soft diet today Continue IV abx until abd pain improves Ambulate Sigmoid Diverticulitis with Intramural abscess - CT w/ sigmoid diverticulitis slightly  worsened compared to the previous CT in January with suspected developing intramural phlegmon/abscess of the sigmoid colon measuring 4.5 x 0.9 x 1.8 cm. No organized extramural collections. No extraluminal air. - No current indication for emergency surgery - Would not recommend IR drainage given phlegmon/abscess appears intramural  - Agree with IV antibiotics - Adv to soft - Hopefully patient will improve with conservative treatment.  If patient fails to improve they may require repeating imaging, drain placement, or surgical intervention resulting in a colectomy/colostomy.  This was discussed with the patient. - If patient improves with conservative therapies, recomend colonoscopy in 4-6 weeks. Her last colonoscopy in 2016 at [facility] showed diverticulosis and 3 small ulcers in her terminal ileum with bx results non-specific. Will also need referral to surgeon for f/u to discuss elective resection. This is now her 3rd episode of sigmoid diverticulitis since September 2023. She did report complete resolution of symptoms after each of her first 2 episodes of diverticulitis.   - We will follow with you   FEN - FLD, IVF VTE - SCDs, please hold Xarelto (last dose 2/20 pm). On heparin gtt.  ID - Zosyn 2/21 >> WBC 7.2 Dispo - Admit to [facility].   LOS: 3 days    Autumn Messing III 05/26/2022

## 2022-05-26 NOTE — Assessment & Plan Note (Signed)
Improving, no surgery planned - Stop heparin - Resume home Xarelto

## 2022-05-27 DIAGNOSIS — K5792 Diverticulitis of intestine, part unspecified, without perforation or abscess without bleeding: Secondary | ICD-10-CM | POA: Diagnosis not present

## 2022-05-27 DIAGNOSIS — E876 Hypokalemia: Secondary | ICD-10-CM | POA: Diagnosis not present

## 2022-05-27 DIAGNOSIS — C50411 Malignant neoplasm of upper-outer quadrant of right female breast: Secondary | ICD-10-CM | POA: Diagnosis not present

## 2022-05-27 DIAGNOSIS — Z86718 Personal history of other venous thrombosis and embolism: Secondary | ICD-10-CM | POA: Diagnosis not present

## 2022-05-27 LAB — BASIC METABOLIC PANEL
Anion gap: 6 (ref 5–15)
BUN: 6 mg/dL (ref 6–20)
CO2: 27 mmol/L (ref 22–32)
Calcium: 8.3 mg/dL — ABNORMAL LOW (ref 8.9–10.3)
Chloride: 104 mmol/L (ref 98–111)
Creatinine, Ser: 0.71 mg/dL (ref 0.44–1.00)
GFR, Estimated: >60 mL/min (ref >60)
Glucose, Bld: 116 mg/dL — ABNORMAL HIGH (ref 70–99)
Potassium: 3.3 mmol/L — ABNORMAL LOW (ref 3.5–5.1)
Sodium: 137 mmol/L (ref 135–145)

## 2022-05-27 LAB — HEPARIN LEVEL (UNFRACTIONATED): Heparin Unfractionated: 0.48 IU/mL (ref 0.30–0.70)

## 2022-05-27 LAB — CBC
HCT: 32.2 % — ABNORMAL LOW (ref 36.0–46.0)
Hemoglobin: 10.7 g/dL — ABNORMAL LOW (ref 12.0–15.0)
MCH: 29.3 pg (ref 26.0–34.0)
MCHC: 33.2 g/dL (ref 30.0–36.0)
MCV: 88.2 fL (ref 80.0–100.0)
Platelets: 202 10*3/uL (ref 150–400)
RBC: 3.65 MIL/uL — ABNORMAL LOW (ref 3.87–5.11)
RDW: 13.2 % (ref 11.5–15.5)
WBC: 5.6 10*3/uL (ref 4.0–10.5)
nRBC: 0 % (ref 0.0–0.2)

## 2022-05-27 LAB — MAGNESIUM: Magnesium: 1.8 mg/dL (ref 1.7–2.4)

## 2022-05-27 MED ORDER — POTASSIUM CHLORIDE CRYS ER 20 MEQ PO TBCR
40.0000 meq | EXTENDED_RELEASE_TABLET | Freq: Once | ORAL | Status: AC
  Administered 2022-05-27: 40 meq via ORAL
  Filled 2022-05-27: qty 2

## 2022-05-27 MED ORDER — RIVAROXABAN 20 MG PO TABS
20.0000 mg | ORAL_TABLET | Freq: Every day | ORAL | Status: DC
  Administered 2022-05-27 – 2022-05-28 (×2): 20 mg via ORAL
  Filled 2022-05-27 (×2): qty 1

## 2022-05-27 NOTE — Progress Notes (Signed)
ANTICOAGULATION CONSULT NOTE - Follow Up Consult  Pharmacy Consult for Heparin Indication: recurrent VTE  Allergies  Allergen Reactions   Hyoscyamine Anaphylaxis   Fosamax [Alendronate] Nausea Only and Other (See Comments)    SEVERE NAUSEA   Nexium [Esomeprazole] Swelling and Other (See Comments)    Tongue became swollen- breathing not affected   Doxycycline Palpitations and Other (See Comments)    Racing heart    Patient Measurements: Height: '5\' 4"'$  (162.6 cm) Weight: 55 kg (121 lb 4.1 oz) IBW/kg (Calculated) : 54.7 Heparin Dosing Weight: TBW  Vital Signs: Temp: 98.2 F (36.8 C) (02/24 1947) BP: 113/71 (02/24 1947) Pulse Rate: 99 (02/24 1947)  Labs: Recent Labs    05/25/22 0001 05/25/22 0850 05/25/22 1455 05/26/22 0431 05/27/22 0324  HGB 10.6*  --   --  10.8* 10.7*  HCT 31.5*  --   --  32.6* 32.2*  PLT 161  --   --  183 202  HEPARINUNFRC 0.73*   < > 0.31 0.57 0.48  CREATININE 0.61  --   --  0.65 0.71   < > = values in this interval not displayed.     Estimated Creatinine Clearance: 67 mL/min (by C-G formula based on SCr of 0.71 mg/dL).   Medications:  Infusions:   heparin 1,100 Units/hr (05/26/22 1524)   piperacillin-tazobactam (ZOSYN)  IV 3.375 g (05/26/22 2119)    Assessment: 32 yoF with PMH breast cancer on antiestrogen therapy, on Xarelto PTA for recurrent DVT, recurrent diverticulitis, presents 2/21 with abd pain. Found to have acute sigmoid diverticulitis w/ abscess. Surgery consulted but no immediate plans for invasive procedures. Anticoagulation converted to heparin infusion with Pharmacy to dose while Xarelto on hold. Prior anticoagulation: Xarelto 20 mg daily, last dose 2/20 AM   Today, 05/27/2022: HL 0.48 therapeutic on 1100 units/hr CBC: hgb 10.7 stable, plts WNL SCr stable at baseline No bleeding per RN  Goal of Therapy:  Heparin level 0.3-0.7 units/ml Monitor platelets by anticoagulation protocol: Yes   Plan:  Continue heparin drip at  1100 units/hr Daily heparin level and CBC F/u for ability to resume PTA Xarelto   Dolly Rias RPh 05/27/2022, 4:45 AM

## 2022-05-27 NOTE — Progress Notes (Signed)
   Subjective/Chief Complaint: Feels better. Slow improvement. Tolerated soft diet   Objective: Vital signs in last 24 hours: Temp:  [98.2 F (36.8 C)-98.5 F (36.9 C)] 98.4 F (36.9 C) (02/25 0546) Pulse Rate:  [81-99] 81 (02/25 0546) Resp:  [17-18] 17 (02/25 0546) BP: (109-113)/(66-76) 112/66 (02/25 0546) SpO2:  [98 %-100 %] 98 % (02/25 0546) Weight:  [55.1 kg] 55.1 kg (02/25 0546) Last BM Date : 05/22/22  Intake/Output from previous day: 02/24 0701 - 02/25 0700 In: 136 [IV Piggyback:136] Out: -  Intake/Output this shift: No intake/output data recorded.  General appearance: alert and cooperative Resp: clear to auscultation bilaterally Cardio: regular rate and rhythm GI: soft, focal mod tenderness LLQ  Lab Results:  Recent Labs    05/26/22 0431 05/27/22 0324  WBC 5.4 5.6  HGB 10.8* 10.7*  HCT 32.6* 32.2*  PLT 183 202   BMET Recent Labs    05/26/22 0431 05/27/22 0324  NA 138 137  K 3.6 3.3*  CL 104 104  CO2 24 27  GLUCOSE 95 116*  BUN 5* 6  CREATININE 0.65 0.71  CALCIUM 8.6* 8.3*   PT/INR No results for input(s): "LABPROT", "INR" in the last 72 hours. ABG No results for input(s): "PHART", "HCO3" in the last 72 hours.  Invalid input(s): "PCO2", "PO2"  Studies/Results: No results found.  Anti-infectives: Anti-infectives (From admission, onward)    Start     Dose/Rate Route Frequency Ordered Stop   05/23/22 2200  piperacillin-tazobactam (ZOSYN) IVPB 3.375 g        3.375 g 12.5 mL/hr over 240 Minutes Intravenous Every 8 hours 05/23/22 1529     05/23/22 1530  piperacillin-tazobactam (ZOSYN) IVPB 3.375 g        3.375 g 100 mL/hr over 30 Minutes Intravenous  Once 05/23/22 1528 05/23/22 1639       Assessment/Plan: s/p * No surgery found * Advance diet. Tolerating soft Continue IV zosyn. I would prefer 1 more day of IV abx before switching to orals since she failed orals at home twice before being admitted Ambulate Perforated diverticulitits  with intramural abscess not drainable Wbc normal Sigmoid Diverticulitis with Intramural abscess - CT w/ sigmoid diverticulitis slightly worsened compared to the previous CT in January with suspected developing intramural phlegmon/abscess of the sigmoid colon measuring 4.5 x 0.9 x 1.8 cm. No organized extramural collections. No extraluminal air. - No current indication for emergency surgery - Would not recommend IR drainage given phlegmon/abscess appears intramural  - Agree with IV antibiotics - Adv to soft - Hopefully patient will improve with conservative treatment.  If patient fails to improve they may require repeating imaging, drain placement, or surgical intervention resulting in a colectomy/colostomy.  This was discussed with the patient. - If patient improves with conservative therapies, recomend colonoscopy in 4-6 weeks. Her last colonoscopy in 2016 at [facility] showed diverticulosis and 3 small ulcers in her terminal ileum with bx results non-specific. Will also need referral to surgeon for f/u to discuss elective resection. This is now her 3rd episode of sigmoid diverticulitis since September 2023. She did report complete resolution of symptoms after each of her first 2 episodes of diverticulitis.   - We will follow with you   FEN - FLD, IVF VTE - SCDs, please hold Xarelto (last dose 2/20 pm). On heparin gtt.  ID - Zosyn 2/21 >> WBC 5.6 Dispo - Admit to [facility].   LOS: 4 days    Stephanie Brewer 05/27/2022

## 2022-05-27 NOTE — Plan of Care (Signed)
  Problem: Education: Goal: Knowledge of General Education information will improve Description: Including pain rating scale, medication(s)/side effects and non-pharmacologic comfort measures Outcome: Progressing   Problem: Health Behavior/Discharge Planning: Goal: Ability to manage health-related needs will improve Outcome: Progressing   Problem: Clinical Measurements: Goal: Ability to maintain clinical measurements within normal limits will improve Outcome: Progressing Goal: Diagnostic test results will improve Outcome: Progressing   Problem: Activity: Goal: Risk for activity intolerance will decrease Outcome: Progressing   Problem: Nutrition: Goal: Adequate nutrition will be maintained Outcome: Progressing   Problem: Coping: Goal: Level of anxiety will decrease Outcome: Progressing   Problem: Elimination: Goal: Will not experience complications related to bowel motility Outcome: Progressing   Problem: Pain Managment: Goal: General experience of comfort will improve Outcome: Progressing   Problem: Safety: Goal: Ability to remain free from injury will improve Outcome: Progressing   Problem: Skin Integrity: Goal: Risk for impaired skin integrity will decrease Outcome: Progressing

## 2022-05-27 NOTE — Assessment & Plan Note (Signed)
Resolved with supplementation and starting spironolactone. 

## 2022-05-27 NOTE — Progress Notes (Addendum)
  Progress Note   Patient: Stephanie Brewer [id number] DOB: 12-25-1965 DOA: 05/23/2022     4 DOS: the patient was seen and examined on 05/27/2022 at 10:00AM      Brief hospital course: [name] is a 57 y.o. F with hx recurrent DVT on Xarelto, hx recurrent diverticulitis, and hx BrCA on anastrozole who presented with few days fever chills, and crampy lower abdominal pain.   2/21: CT showed diverticulitis with phlegmon; Gen Surg consulted, admitted on IV antibiotics 2/24: Advanced to soft diet     Assessment and Plan: * Recurrent acute diverticulitis with abscess/phlegmon Overall improving, but still with pain. - Continue Zosyn - Consult Gen Surg    Hypokalemia - Supplement K  History of recurrent deep vein thrombosis (DVT) Improving, no surgery planned - Stop heparin - Resume home Xarelto  Normocytic anemia Mild, stable, no clinical bleeding. - Repeat with PCP in 2-4 weeks  Malignant neoplasm of upper-outer quadrant of right breast in female, estrogen receptor positive (HCC) - Hold anastrozole          Subjective: No complaints, still some pain, no other concerning symptoms.     Physical Exam: BP 113/80 (BP Location: Right Wrist)   Pulse 80   Temp 98 F (36.7 C) (Oral)   Resp 17   Ht 5' 4"$  (1.626 m)   Wt 55.1 kg   SpO2 100%   BMI 20.85 kg/m   Elderly adult female, sitting up in recliner, no acute distress, interactive and appropriate RRR, no murmurs, no peripheral edema Respiratory rate normal, lungs clear without rales or wheezes Abdomen Some mild left-sided abdominal pain, and guarding, no rebound or rigidity Attention normal, affect appropriate, judgment and insight appear normal  Data Reviewed: BMP pertinent for K 3.3, otherwise normal CBC unremarkable Discussed with General Surgery   Family Communication: None presnt    Disposition: Status is: Inpatient         Author: Edwin Dada, MD 05/27/2022 3:29  PM  For on call review [email or web address].

## 2022-05-28 ENCOUNTER — Other Ambulatory Visit (HOSPITAL_COMMUNITY): Payer: Self-pay

## 2022-05-28 DIAGNOSIS — K5792 Diverticulitis of intestine, part unspecified, without perforation or abscess without bleeding: Secondary | ICD-10-CM | POA: Diagnosis not present

## 2022-05-28 MED ORDER — AMOXICILLIN-POT CLAVULANATE 875-125 MG PO TABS
1.0000 | ORAL_TABLET | Freq: Two times a day (BID) | ORAL | 0 refills | Status: DC
  Filled 2022-05-28: qty 19, 10d supply, fill #0

## 2022-05-28 MED ORDER — POLYETHYLENE GLYCOL 3350 17 G PO PACK
17.0000 g | PACK | Freq: Every day | ORAL | Status: DC
  Administered 2022-05-28: 17 g via ORAL
  Filled 2022-05-28: qty 1

## 2022-05-28 MED ORDER — POLYETHYLENE GLYCOL 3350 17 G PO PACK: 17.0000 g | PACK | Freq: Every day | ORAL | 0 refills | Status: DC

## 2022-05-28 MED ORDER — AMOXICILLIN-POT CLAVULANATE 875-125 MG PO TABS
1.0000 | ORAL_TABLET | Freq: Two times a day (BID) | ORAL | Status: DC
  Administered 2022-05-28: 1 via ORAL
  Filled 2022-05-28: qty 1

## 2022-05-28 MED ORDER — BISACODYL 10 MG RE SUPP: 10.0000 mg | Freq: Every day | RECTAL | Status: DC | PRN

## 2022-05-28 NOTE — Progress Notes (Signed)
Patient ID: Stephanie Brewer, female   DOB: 1965-10-10, 57 y.o.   MRN: [id number] [facility] Surgery Progress Note     Subjective: CC-  Continues to feel better. Denies abdominal pain, nausea, vomiting. Tolerating soft diet. Passing flatus, has not had a BM in several days.  Objective: Vital signs in last 24 hours: Temp:  [98 F (36.7 C)-98.2 F (36.8 C)] 98.2 F (36.8 C) (02/26 0631) Pulse Rate:  [77-88] 77 (02/26 0631) Resp:  [18] 18 (02/26 0631) BP: (98-113)/(66-80) 102/66 (02/26 0631) SpO2:  [97 %-100 %] 100 % (02/26 0631) Weight:  [55.2 kg] 55.2 kg (02/26 0500) Last BM Date : 05/22/22  Intake/Output from previous day: 02/25 0701 - 02/26 0700 In: 720 [P.O.:720] Out: -  Intake/Output this shift: No intake/output data recorded.  PE: Gen:  Alert, NAD, pleasant Pulm: rate and effort normal on room air Abd: soft, ND, NT  Lab Results:  Recent Labs    05/26/22 0431 05/27/22 0324  WBC 5.4 5.6  HGB 10.8* 10.7*  HCT 32.6* 32.2*  PLT 183 202   BMET Recent Labs    05/26/22 0431 05/27/22 0324  NA 138 137  K 3.6 3.3*  CL 104 104  CO2 24 27  GLUCOSE 95 116*  BUN 5* 6  CREATININE 0.65 0.71  CALCIUM 8.6* 8.3*   PT/INR No results for input(s): "LABPROT", "INR" in the last 72 hours. CMP     Component Value Date/Time   NA 137 05/27/2022 0324   NA 137 05/23/2022 1122   K 3.3 (L) 05/27/2022 0324   CL 104 05/27/2022 0324   CO2 27 05/27/2022 0324   GLUCOSE 116 (H) 05/27/2022 0324   BUN 6 05/27/2022 0324   BUN 11 05/23/2022 1122   CREATININE 0.71 05/27/2022 0324   CREATININE 0.73 01/15/2022 0822   CALCIUM 8.3 (L) 05/27/2022 0324   PROT 7.6 05/23/2022 1316   PROT 6.8 08/15/2021 0959   ALBUMIN 4.3 05/23/2022 1316   ALBUMIN 4.3 08/15/2021 0959   AST 9 (L) 05/23/2022 1316   AST 12 (L) 01/15/2022 0822   ALT 5 05/23/2022 1316   ALT 7 01/15/2022 0822   ALKPHOS 66 05/23/2022 1316   BILITOT 0.9 05/23/2022 1316   BILITOT 0.7 01/15/2022 0822   GFRNONAA >60  05/27/2022 0324   GFRNONAA >60 01/15/2022 0822   GFRAA >60 12/16/2019 0810   Lipase     Component Value Date/Time   LIPASE 13 05/23/2022 1316       Studies/Results: No results found.  Anti-infectives: Anti-infectives (From admission, onward)    Start     Dose/Rate Route Frequency Ordered Stop   05/28/22 1045  amoxicillin-clavulanate (AUGMENTIN) 875-125 MG per tablet 1 tablet        1 tablet Oral Every 12 hours 05/28/22 0949     05/23/22 2200  piperacillin-tazobactam (ZOSYN) IVPB 3.375 g  Status:  Discontinued        3.375 g 12.5 mL/hr over 240 Minutes Intravenous Every 8 hours 05/23/22 1529 05/28/22 0949   05/23/22 1530  piperacillin-tazobactam (ZOSYN) IVPB 3.375 g        3.375 g 100 mL/hr over 30 Minutes Intravenous  Once 05/23/22 1528 05/23/22 1639        Assessment/Plan Sigmoid Diverticulitis with Intramural abscess - CT 2/21 w/ sigmoid diverticulitis slightly worsened compared to the previous CT in January with suspected developing intramural phlegmon/abscess of the sigmoid colon measuring 4.5 x 0.9 x 1.8 cm. No organized extramural collections. No extraluminal air. -  No current indication for emergency surgery - Would not recommend IR drainage given phlegmon/abscess appears intramural  - Continues to improve clinically. WBC has normalized. Tolerating soft diet. Will switch to augmentin. Add miralax. Pisinemo for discharge home today on oral antibiotics. Patient/PCP have sent referral to GI and are working on arranging follow up for colonoscopy in about 6 weeks. Follow up has already been arranged in our office with Dr. Johney Maine to discuss surgery.   FEN - soft diet VTE - SCDs, Xarelto ID - Zosyn 2/21 >> 2/26, augmentin 2/26>>  I reviewed last 24 h vitals and pain scores, last 48 h intake and output, and last 24 h labs and trends.    LOS: 5 days    [facility] 05/28/2022, 9:49 AM Please see Amion for pager number during day hours  7:00am-4:30pm

## 2022-05-28 NOTE — Discharge Summary (Signed)
Physician Discharge Summary   Patient: Stephanie Brewer MRN: [id number] DOB: 28-Feb-1966  Admit date:     05/23/2022  Discharge date: 05/28/22  Discharge Physician: Edwin Dada   PCP: [name], DO     Recommendations at discharge:  Follow up with General Surgery Dr. Johney Maine for recurrent diverticulitis Follow up with Gastroenterology as directed Dr. Johney Maine:  Please repeat CBC in 2-4 weeks      Discharge Diagnoses: Principal Problem:   Recurrent acute diverticulitis with abscess/phlegmon Active Problems:   Malignant neoplasm of upper-outer quadrant of right breast in female, estrogen receptor positive ([place])   Normocytic anemia   History of recurrent deep vein thrombosis (DVT)   Hypokalemia      Hospital Course: Stephanie Brewer is a 57 y.o. F with hx recurrent DVT on Xarelto, hx recurrent diverticulitis, and hx BrCA on anastrozole who presented with few days fever chills, and crampy lower abdominal pain.       * Recurrent acute diverticulitis with abscess/phlegmon CT in the ER showed diverticulitis with phlegmon.  Started on antibiotics.  General Surgery were consulted who recommended no drainage or resection.  Gradually improved, pain notably better, WBC normal, tolerating soft diet well.  Discharged to complete 10 more days Augmentin, has appropriate follow up in place.      History of recurrent deep vein thrombosis (DVT) Continue on Xarelto.  No clinical bleeding in the hospital  Normocytic anemia Mild, stable, no clinical bleeding.  Malignant neoplasm of upper-outer quadrant of right breast in female, estrogen receptor positive ([place]) On anastrozole            The [place] was reviewed for this patient prior to discharge.  Consultants: General Surgery Procedures performed:  CT abdomen and pelvis with contrast, see below   Disposition: Home Diet recommendation:  Diverticulitis  diet  DISCHARGE MEDICATION: Allergies as of 05/28/2022       Reactions   Hyoscyamine Anaphylaxis   Fosamax [alendronate] Nausea Only, Other (See Comments)   SEVERE NAUSEA   Nexium [esomeprazole] Swelling, Other (See Comments)   Tongue became swollen- breathing not affected   Doxycycline Palpitations, Other (See Comments)   Racing heart        Medication List     TAKE these medications    amoxicillin-clavulanate 875-125 MG tablet Commonly known as: AUGMENTIN Take 1 tablet by mouth every 12 (twelve) hours.   anastrozole 1 MG tablet Commonly known as: ARIMIDEX TAKE ONE TABLET ONCE DAILY What changed: when to take this   glucose blood test strip UAD to check sugar. R73.03   Lancets Thin Misc UAD to check sugar. R73.03   ondansetron 4 MG tablet Commonly known as: ZOFRAN Take 1 tablet (4 mg total) by mouth every 6 (six) hours as needed for nausea or vomiting.   polyethylene glycol 17 g packet Commonly known as: MIRALAX / GLYCOLAX Take 17 g by mouth daily. Start taking on: May 29, 2022   Tylenol 325 MG tablet Generic drug: acetaminophen Take 325 mg by mouth every 6 (six) hours as needed for headache or mild pain.   Wegovy 2.4 MG/0.75ML Soaj Generic drug: Semaglutide-Weight Management Inject 2.4 mg into the skin every 7 (seven) days. What changed:  when to take this additional instructions   Xarelto 20 MG Tabs tablet Generic drug: rivaroxaban TAKE ONE TABLET ONCE DAILY WITH SUPPER What changed: See the new instructions.        Follow-up Information     Gastroenterology, [facility]  Follow up.   Why: Please follow up with your gastroenterologist. If you do not still follow with Dr. Britta Mccreedy, you can follow up with [facility]. We would like you to get a colonoscopy in ~4-6 weeks . Contact information: [address] [place] [address] [phone]         Michael Boston, MD Follow up on 07/16/2022.   Specialties: General Surgery,  Colon and Rectal Surgery Why: 11am. Please arrive 30 minutes prior to your appointment for paperwork. Please bring a copy of your photo ID and insurance card. Contact information: [address] [place] [address] [phone]                 Discharge Instructions     Discharge instructions   Complete by: As directed    **IMPORTANT DISCHARGE INSTRUCTIONS**   From Dr. Loleta Books: You are admitted for diverticulitis You are treated with 5 days of piperacillin/tazobactam (Zosyn)  You should continue 10 more days of amoxicillin/clavulanate (Augmentin)  Resume your normal home medicines  The General Surgery team recommended MiraLAX daily to keep stool soft Follow their dietary advice, as we discussed  Keep the follow up appointments as listed below in the To Do section  Return for obviously persistently worsening pain, new fever, large amounts of blood in the stool, or confusion/passing out.   Increase activity slowly   Complete by: As directed        Discharge Exam: Filed Weights   05/26/22 0500 05/27/22 0546 05/28/22 0500  Weight: 55 kg 55.1 kg 55.2 kg    General: Pt is alert, awake, not in acute distress Cardiovascular: RRR, nl S1-S2, no murmurs appreciated.   No LE edema.   Respiratory: Normal respiratory rate and rhythm.  CTAB without rales or wheezes. Abdominal: Abdomen soft and only mild LLQ tenderness.  No distension or HSM.   Neuro/Psych: Strength symmetric in upper and lower extremities.  Judgment and insight appear normal.   Condition at discharge: stable  The results of significant diagnostics from this hospitalization (including imaging, microbiology, ancillary and laboratory) are listed below for reference.   Imaging Studies: CT ABDOMEN PELVIS W CONTRAST  Result Date: 05/23/2022 CLINICAL DATA:  Diverticulitis, complication suspected EXAM: CT ABDOMEN AND PELVIS WITH CONTRAST TECHNIQUE: Multidetector CT imaging of the abdomen and  pelvis was performed using the standard protocol following bolus administration of intravenous contrast. RADIATION DOSE REDUCTION: This exam was performed according to the departmental dose-optimization program which includes automated exposure control, adjustment of the mA and/or kV according to patient size and/or use of iterative reconstruction technique. CONTRAST:  39m OMNIPAQUE IOHEXOL 300 MG/ML  SOLN COMPARISON:  04/02/2022 FINDINGS: Lower chest: Included lung bases are clear.  Heart size is normal. Hepatobiliary: Stable small partially calcified lesion at the right hepatic dome. No new focal liver abnormality. Unremarkable gallbladder. No hyperdense gallstone. No biliary dilatation. Pancreas: Unremarkable. No pancreatic ductal dilatation or surrounding inflammatory changes. Spleen: Normal in size without focal abnormality. Adrenals/Urinary Tract: Unremarkable adrenal glands. Stable 4.0 cm right renal cyst which does not require follow-up imaging. No solid renal lesion, stone, or hydronephrosis. Urinary bladder is incompletely distended, limiting its evaluation. Stomach/Bowel: Stomach within normal limits. No dilated loops of bowel. Normal appendix in the right lower quadrant. Severe changes of colitis of the sigmoid colon where there are numerous prominent diverticula. Suspect developing intramural phlegmon/abscess involving the sigmoid colon at this location measuring approximately 4.5 x 0.9 x 1.8 cm (series 2, image 68). Similar  degree of fat stranding and adjacent free fluid. No organized extramural collections. No extraluminal air. Vascular/Lymphatic: No significant vascular findings are present. No enlarged abdominal or pelvic lymph nodes. Reproductive: Uterus and right adnexa are unremarkable. Inflammatory changes in the pelvis closely approximate the left ovary, as previously seen. Other: No ascites.  No pneumoperitoneum. Musculoskeletal: No acute or significant osseous findings. IMPRESSION: Severe  changes of colitis of the sigmoid colon where there are numerous prominent diverticula, slightly worsened compared to the previous CT. Suspect developing intramural phlegmon/abscess involving the sigmoid colon at this location measuring approximately 4.5 x 0.9 x 1.8 cm. No organized extramural collections. No extraluminal air. Electronically Signed   By: Davina Poke D.O.   On: 05/23/2022 15:08    Microbiology: Results for orders placed or performed in visit on 01/31/22  COVID-19, Flu A+B and RSV     Status: None   Collection Time: 01/31/22 11:34 AM   Specimen: Nasopharyngeal(NP) swabs in vial transport medium   Nasopharynge  Previous  Result Value Ref Range Status   SARS-CoV-2, NAA Not Detected Not Detected Final   Influenza A, NAA Not Detected Not Detected Final   Influenza B, NAA Not Detected Not Detected Final   RSV, NAA Not Detected Not Detected Final   Test Information: Comment  Final    Comment: This nucleic acid amplification test was developed and its performance characteristics determined by [employer]. Nucleic acid amplification tests include RT-PCR and TMA. This test has not been FDA cleared or approved. This test has been authorized by FDA under an Emergency Use Authorization (EUA). This test is only authorized for the duration of time the declaration that circumstances exist justifying the authorization of the emergency use of in vitro diagnostic tests for detection of SARS-CoV-2 virus and/or diagnosis of COVID-19 infection under section 564(b)(1) of the Act, 21 U.S.C. [id number]) (1), unless the authorization is terminated or revoked sooner. When diagnostic testing is negative, the possibility of a false negative result should be considered in the context of a patient's recent exposures and the presence of clinical signs and symptoms consistent with COVID-19. An individual without symptoms of COVID-19 and who is not shedding SARS-CoV-2 virus wo uld expect to  have a negative (not detected) result in this assay.     Labs: CBC: Recent Labs  Lab 05/23/22 1316 05/24/22 0143 05/25/22 0001 05/26/22 0431 05/27/22 0324  WBC 9.7 7.5 7.2 5.4 5.6  NEUTROABS 8.0*  --   --   --   --   HGB 13.3 10.9* 10.6* 10.8* 10.7*  HCT 39.1 32.4* 31.5* 32.6* 32.2*  MCV 86.9 87.1 88.5 88.8 88.2  PLT 202 157 161 183 [id number]   Basic Metabolic Panel: Recent Labs  Lab 05/23/22 1316 05/24/22 0143 05/25/22 0001 05/26/22 0431 05/27/22 0324  NA 137 134* 137 138 137  K 3.9 3.2* 3.9 3.6 3.3*  CL 99 101 107 104 104  CO2 '24 22 23 24 27  '$ GLUCOSE 90 113* 91 95 116*  BUN '12 8 6 '$ 5* 6  CREATININE 0.72 0.65 0.61 0.65 0.71  CALCIUM 9.6 8.0* 8.0* 8.6* 8.3*  MG  --  1.9 2.1 2.1 1.8   Liver Function Tests: Recent Labs  Lab 05/23/22 1316  AST 9*  ALT 5  ALKPHOS 66  BILITOT 0.9  PROT 7.6  ALBUMIN 4.3   CBG: Recent Labs  Lab 05/23/22 2342 05/24/22 0549  GLUCAP 112* 117*    Discharge time spent: approximately 35 minutes spent on discharge counseling, evaluation  of patient on day of discharge, and coordination of discharge planning with nursing, social work, pharmacy and case management  Signed: Edwin Dada, MD [facility] 05/28/2022

## 2022-05-29 ENCOUNTER — Encounter: Payer: Self-pay | Admitting: *Deleted

## 2022-05-29 ENCOUNTER — Telehealth: Payer: Self-pay | Admitting: *Deleted

## 2022-05-29 NOTE — Transitions of Care (Post Inpatient/ED Visit) (Signed)
   05/29/2022  Name: Stephanie Brewer MRN: [id number] DOB: [birth year]  Today's TOC FU Call Status: Today's TOC FU Call Status:: Successful TOC FU Call Competed TOC FU Call Complete Date: 05/29/22  Transition Care Management Follow-up Telephone Call Date of Discharge: 05/28/22 Discharge Facility: [facility]) Type of Discharge: Inpatient Admission Primary Inpatient Discharge Diagnosis:: Recurrent acute diverticulitis with abscess/phlegmon How have you been since you were released from the hospital?: Better Any questions or concerns?: Yes Patient Questions/Concerns:: Needs to schedule appt with GI but [place] hasn't called back on the referral placed by PCP on 05/23/22 Patient Questions/Concerns Addressed: Notified Provider of Patient Questions/Concerns (Urgent staff message sent to [facility] GI Urgent Referral Pool. Patient needs colonoscopy prior to surgical consult on 07/16/22 at [place] with Dr Johney Maine)  Items Reviewed: Did you receive and understand the discharge instructions provided?: Yes Medications obtained and verified?: Yes (Medications Reviewed) (Augmentin) Any new allergies since your discharge?: No Dietary orders reviewed?: Yes Type of Diet Ordered:: soft (recommended boost, ensure, carnation, etc to increase calorie and nutrient intake since her appetite is still poor) Do you have support at home?: Yes People in Home: spouse Name of Support/Comfort Primary Source: [facility] and Equipment/Supplies: [place] Ordered?: No Any new equipment or medical supplies ordered?: No  Functional Questionnaire: Do you need assistance with bathing/showering or dressing?: No Do you need assistance with meal preparation?: No Do you need assistance with eating?: No Do you have difficulty maintaining continence: No Do you need assistance with getting out of bed/getting out of a chair/moving?: No Do you have difficulty managing or taking your medications?: No  Folllow up  appointments reviewed: PCP Follow-up appointment confirmed?: [facility] Follow-up appointment confirmed?: Yes Date of Specialist follow-up appointment?: 07/16/22 Follow-Up Specialty Provider:: Dr Johney Maine, Surgeon with [facility]. GI appointment pending. Do you need transportation to your follow-up appointment?: No Do you understand care options if your condition(s) worsen?: Yes-patient verbalized understanding  SDOH Interventions Today    Flowsheet Row Most Recent Value  SDOH Interventions   Transportation Interventions Intervention Not Indicated  Financial Strain Interventions Intervention Not Indicated      Interventions Today    Flowsheet Row Most Recent Value  Chronic Disease   Chronic disease during today's visit Other  [Recurrent acute diverticulitis with abscess/phlegmon]  General Interventions   General Interventions Discussed/Reviewed Referral to Nurse  [scheduled for telephone f/u with Joellyn Quails, RN Care Coordinator on 06/14/22]  Nutrition Interventions   Nutrition Discussed/Reviewed Nutrition Discussed, Supplmental nutrition      TOC Interventions Today    Flowsheet Row Most Recent Value  TOC Interventions   TOC Interventions Discussed/Reviewed TOC Interventions Discussed, TOC Interventions Reviewed, Contacted provider for patient needs, S/S of infection  [patient has not seen a GI in >20 yrs. Needs to have colonoscopy before surgical consult on 07/16/22. Last seen at [place]. Sent staff message. Discharge inst recommended [facility], but they are scheduling > 3 months out for new patients]       Chong Sicilian, BSN, RN-BC [place]: [phone] Main #: [phone]

## 2022-05-30 ENCOUNTER — Encounter: Payer: Self-pay | Admitting: Gastroenterology

## 2022-06-01 ENCOUNTER — Ambulatory Visit: Payer: BC Managed Care – PPO | Admitting: Gastroenterology

## 2022-06-01 ENCOUNTER — Telehealth: Payer: Self-pay

## 2022-06-01 ENCOUNTER — Encounter: Payer: Self-pay | Admitting: Gastroenterology

## 2022-06-01 ENCOUNTER — Encounter: Payer: Self-pay | Admitting: Family Medicine

## 2022-06-01 ENCOUNTER — Other Ambulatory Visit (INDEPENDENT_AMBULATORY_CARE_PROVIDER_SITE_OTHER): Payer: BC Managed Care – PPO

## 2022-06-01 VITALS — BP 100/82 | HR 96 | Ht 64.0 in | Wt 126.2 lb

## 2022-06-01 DIAGNOSIS — K5792 Diverticulitis of intestine, part unspecified, without perforation or abscess without bleeding: Secondary | ICD-10-CM | POA: Diagnosis not present

## 2022-06-01 LAB — COMPREHENSIVE METABOLIC PANEL
ALT: 7 U/L (ref 0–35)
AST: 10 U/L (ref 0–37)
Albumin: 3.8 g/dL (ref 3.5–5.2)
Alkaline Phosphatase: 63 U/L (ref 39–117)
BUN: 16 mg/dL (ref 6–23)
CO2: 32 mEq/L (ref 19–32)
Calcium: 9.7 mg/dL (ref 8.4–10.5)
Chloride: 99 mEq/L (ref 96–112)
Creatinine, Ser: 0.77 mg/dL (ref 0.40–1.20)
GFR: 85.6 mL/min (ref >60.00)
Glucose, Bld: 88 mg/dL (ref 70–99)
Potassium: 3.8 mEq/L (ref 3.5–5.1)
Sodium: 140 mEq/L (ref 135–145)
Total Bilirubin: 0.2 mg/dL (ref 0.2–1.2)
Total Protein: 7.3 g/dL (ref 6.0–8.3)

## 2022-06-01 LAB — CBC WITH DIFFERENTIAL/PLATELET
Basophils Absolute: 0.1 10*3/uL (ref 0.0–0.1)
Basophils Relative: 1.3 % (ref 0.0–3.0)
Eosinophils Absolute: 0.2 10*3/uL (ref 0.0–0.7)
Eosinophils Relative: 3.6 % (ref 0.0–5.0)
HCT: 38.2 % (ref 36.0–46.0)
Hemoglobin: 12.9 g/dL (ref 12.0–15.0)
Lymphocytes Relative: 26.8 % (ref 12.0–46.0)
Lymphs Abs: 1.2 10*3/uL (ref 0.7–4.0)
MCHC: 33.8 g/dL (ref 30.0–36.0)
MCV: 87 fl (ref 78.0–100.0)
Monocytes Absolute: 0.4 10*3/uL (ref 0.1–1.0)
Monocytes Relative: 8.5 % (ref 3.0–12.0)
Neutro Abs: 2.6 10*3/uL (ref 1.4–7.7)
Neutrophils Relative %: 59.8 % (ref 43.0–77.0)
Platelets: 397 10*3/uL (ref 150.0–400.0)
RBC: 4.39 Mil/uL (ref 3.87–5.11)
RDW: 14.1 % (ref 11.5–15.5)
WBC: 4.3 10*3/uL (ref 4.0–10.5)

## 2022-06-01 LAB — C-REACTIVE PROTEIN: CRP: 1.5 mg/dL (ref 0.5–20.0)

## 2022-06-01 NOTE — Progress Notes (Signed)
Chief Complaint: For colonoscopy  Referring Provider:  [name], DO      ASSESSMENT AND PLAN;   #1. Recurrent acute sigmoid diverticulitis (on augmentin until 3/6)   Plan: -CBC, CMP, CRP -Low fiber  diet. After she finishes A/Bs, high fiber/Benefiber QD -Colon with miralax in 6-8 weeks. Hold Xeralto 1 day before after clearence.  Hold Wegovy 7 days prior   Discussed risks & benefits of colonoscopy. Risks including rare perforation req laparotomy, bleeding after bx/polypectomy req blood transfusion, rarely missing neoplasms, risks of anesthesia/sedation, rare risk of damage to internal organs. Benefits outweigh the risks. Patient agrees to proceed. All the questions were answered. Pt consents to proceed.  HPI:    Stephanie Brewer is a 57 y.o. female  With H/O recurrent DVT on Xarelto, hx recurrent diverticulitis, and hx BrCA on anastrozole, prediabetes on Wegovy  FU adm 2/21-2/26 with Sigmoid Diverticulitis with Intramural abscess/phlegmon (4.5 cm, did not get IR drainage since it appeared to be more phlegmon, seen by surgery), treated with IV antibiotics then switched to p.o.  Augmentin. Still on augmentin until 3/6  Seen by surgery, advised colonoscopy.  She feels much better.  No fever/chills No abdo pain Denies having any diarrhea or constipation.  No NSAIDs  Denies having any upper GI symptoms including heartburn, nausea, vomiting.  No odynophagia or dysphagia.  Note that this is her third episode of sigmoid diverticulitis in September 2023.  I have reviewed CT scans independently.  She has follow-up appoint with Dr. Johney Maine.  No family history of colon cancer or colon polyps.   Previous GI workup: Her last colonoscopy was in 2016 at [facility]  (Dr Britta Mccreedy) showed diverticulosis and 3 small ulcers in her terminal ileum with bx results non-specific.   EGD 12/2015: Nl    Past Medical History:  Diagnosis Date   Breast cancer ([place])     Displacement of breast implant 12/24/2017   Diverticulitis    Diverticulosis    DVT (deep venous thrombosis) (H[facility])    left leg knee and ankle    GERD (gastroesophageal reflux disease)    Kidney stones    Pneumonia    Prediabetes     Past Surgical History:  Procedure Laterality Date   BREAST ENHANCEMENT SURGERY     Augmentation 2003   BREAST IMPLANT REMOVAL Bilateral 01/14/2020   Procedure: REMOVAL BREAST IMPLANTS;  Surgeon: Cindra Presume, MD;  Location: [facility];  Service: Plastics;  Laterality: Bilateral;   BREAST LUMPECTOMY WITH RADIOACTIVE SEED AND SENTINEL LYMPH NODE BIOPSY Right 01/14/2020   Procedure: Right breast seed localized lumpectomy with sentinel lymph node biopsy;  Surgeon: Rolm Bookbinder, MD;  Location: [place];  Service: General;  Laterality: Right;   LIPOSUCTION WITH LIPOFILLING Bilateral 11/29/2020   Procedure: LIPOSUCTION WITH LIPOFILLING FROM ABDOMEN TO BILATERAL BREASTS;  Surgeon: Cindra Presume, MD;  Location: [place];  Service: Plastics;  Laterality: Bilateral;   MASTOPEXY Left 11/29/2020   Procedure: LEFT MASTOPEXY;  Surgeon: Cindra Presume, MD;  Location: [place];  Service: Plastics;  Laterality: Left;   PORTACATH PLACEMENT N/A 09/01/2019   Procedure: INSERTION PORT-A-CATH WITH ULTRASOUND GUIDANCE;  Surgeon: Rolm Bookbinder, MD;  Location: [place];  Service: General;  Laterality: N/A;   TRIGGER FINGER RELEASE Left 05/09/2020   Procedure: RELEASE TRIGGER FINGER/A-1 PULLEY LEFT LONG;  Surgeon: Leanora Cover, MD;  Location: [place];  Service: Orthopedics;  Laterality: Left;  UMBILICAL HERNIA REPAIR  2002    Family History  Problem Relation Age of Onset   Hyperlipidemia Mother    Polymyalgia rheumatica Mother    Hypertension Father    Hyperlipidemia Father    Diabetes type I Brother    Diabetes type II Maternal Grandfather    Ovarian cancer  Maternal Aunt    Allergies Daughter     Social History   Tobacco Use   Smoking status: Never   Smokeless tobacco: Never  Vaping Use   Vaping Use: Never used  Substance Use Topics   Alcohol use: Yes    Comment: 1 PER MONTH   Drug use: No    Current Outpatient Medications  Medication Sig Dispense Refill   amoxicillin-clavulanate (AUGMENTIN) 875-125 MG tablet Take 1 tablet by mouth every 12 (twelve) hours. 19 tablet 0   anastrozole (ARIMIDEX) 1 MG tablet TAKE ONE TABLET ONCE DAILY (Patient taking differently: Take 1 mg by mouth at bedtime.) 90 tablet 3   glucose blood test strip UAD to check sugar. R73.03 100 each 12   Lancets Thin MISC UAD to check sugar. R73.03 100 each 12   Semaglutide-Weight Management (WEGOVY) 2.4 MG/0.75ML SOAJ Inject 2.4 mg into the skin every 7 (seven) days. (Patient taking differently: Inject 2.4 mg into the skin See admin instructions. Inject 2.4 mg into the skin every 7-28 days) 9 mL 3   TYLENOL 325 MG tablet Take 325 mg by mouth every 6 (six) hours as needed for headache or mild pain.     XARELTO 20 MG TABS tablet TAKE ONE TABLET ONCE DAILY WITH SUPPER (Patient taking differently: Take 20 mg by mouth in the morning.) 30 tablet 12   Calcium Carb-Cholecalciferol (OYSTER SHELL CALCIUM W/D) 500-5 MG-MCG TABS Take 1 tablet by mouth daily. (Patient not taking: Reported on 06/01/2022)     ondansetron (ZOFRAN) 4 MG tablet Take 1 tablet (4 mg total) by mouth every 6 (six) hours as needed for nausea or vomiting. (Patient not taking: Reported on 06/01/2022) 20 tablet 0   polyethylene glycol (MIRALAX / GLYCOLAX) 17 g packet Take 17 g by mouth daily. (Patient not taking: Reported on 06/01/2022) 14 each 0   No current facility-administered medications for this visit.    Allergies  Allergen Reactions   Hyoscyamine Anaphylaxis   Fosamax [Alendronate] Nausea Only and Other (See Comments)    SEVERE NAUSEA   Nexium [Esomeprazole] Swelling and Other (See Comments)    Tongue  became swollen- breathing not affected   Doxycycline Palpitations and Other (See Comments)    Racing heart    Review of Systems:  Constitutional: Denies fever, chills, diaphoresis, appetite change and fatigue.  HEENT: Denies photophobia, eye pain, redness, hearing loss, ear pain, congestion, sore throat, rhinorrhea, sneezing, mouth sores, neck pain, neck stiffness and tinnitus.   Respiratory: Denies SOB, DOE, cough, chest tightness,  and wheezing.   Cardiovascular: Denies chest pain, palpitations and leg swelling.  Genitourinary: Denies dysuria, urgency, frequency, hematuria, flank pain and difficulty urinating.  Musculoskeletal: Denies myalgias, back pain, joint swelling, arthralgias and gait problem.  Skin: No rash.  Neurological: Denies dizziness, seizures, syncope, weakness, light-headedness, numbness and headaches.  Hematological: Denies adenopathy. Easy bruising, personal or family bleeding history  Psychiatric/Behavioral: No anxiety or depression     Physical Exam:    BP 100/82 (BP Location: Left Arm, Patient Position: Sitting, Cuff Size: Normal)   Pulse 96   Ht '5\' 4"'$  (1.626 m)   Wt 126 lb 4 oz (57.3 kg)  BMI 21.67 kg/m  Wt Readings from Last 3 Encounters:  06/01/22 126 lb 4 oz (57.3 kg)  05/28/22 121 lb 11.1 oz (55.2 kg)  05/23/22 123 lb (55.8 kg)   Constitutional:  Well-developed, in no acute distress. Psychiatric: Normal mood and affect. Behavior is normal. HEENT: Pupils normal.  Conjunctivae are normal. No scleral icterus. Cardiovascular: Normal rate, regular rhythm. No edema Pulmonary/chest: Effort normal and breath sounds normal. No wheezing, rales or rhonchi. Abdominal: Soft, nondistended. Nontender. Bowel sounds active throughout. There are no masses palpable. No hepatomegaly. Rectal: Deferred Neurological: Alert and oriented to person place and time. Skin: Skin is warm and dry. No rashes noted.  Data Reviewed: I have personally reviewed following labs and  imaging studies  CBC:    Latest Ref Rng & Units 05/27/2022    3:24 AM 05/26/2022    4:31 AM 05/25/2022   12:01 AM  CBC  WBC 4.0 - 10.5 K/uL 5.6  5.4  7.2   Hemoglobin 12.0 - 15.0 g/dL 10.7  10.8  10.6   Hematocrit 36.0 - 46.0 % 32.2  32.6  31.5   Platelets 150 - 400 K/uL 202  183  161     CMP:    Latest Ref Rng & Units 05/27/2022    3:24 AM 05/26/2022    4:31 AM 05/25/2022   12:01 AM  CMP  Glucose 70 - 99 mg/dL 116  95  91   BUN 6 - 20 mg/dL '6  5  6   '$ Creatinine 0.44 - 1.00 mg/dL 0.71  0.65  0.61   Sodium 135 - 145 mmol/L 137  138  137   Potassium 3.5 - 5.1 mmol/L 3.3  3.6  3.9   Chloride 98 - 111 mmol/L 104  104  107   CO2 22 - 32 mmol/L '27  24  23   '$ Calcium 8.9 - 10.3 mg/dL 8.3  8.6  8.0        Radiology Studies: CT ABDOMEN PELVIS W CONTRAST  Result Date: 05/23/2022 CLINICAL DATA:  Diverticulitis, complication suspected EXAM: CT ABDOMEN AND PELVIS WITH CONTRAST TECHNIQUE: Multidetector CT imaging of the abdomen and pelvis was performed using the standard protocol following bolus administration of intravenous contrast. RADIATION DOSE REDUCTION: This exam was performed according to the departmental dose-optimization program which includes automated exposure control, adjustment of the mA and/or kV according to patient size and/or use of iterative reconstruction technique. CONTRAST:  42m OMNIPAQUE IOHEXOL 300 MG/ML  SOLN COMPARISON:  04/02/2022 FINDINGS: Lower chest: Included lung bases are clear.  Heart size is normal. Hepatobiliary: Stable small partially calcified lesion at the right hepatic dome. No new focal liver abnormality. Unremarkable gallbladder. No hyperdense gallstone. No biliary dilatation. Pancreas: Unremarkable. No pancreatic ductal dilatation or surrounding inflammatory changes. Spleen: Normal in size without focal abnormality. Adrenals/Urinary Tract: Unremarkable adrenal glands. Stable 4.0 cm right renal cyst which does not require follow-up imaging. No solid renal  lesion, stone, or hydronephrosis. Urinary bladder is incompletely distended, limiting its evaluation. Stomach/Bowel: Stomach within normal limits. No dilated loops of bowel. Normal appendix in the right lower quadrant. Severe changes of colitis of the sigmoid colon where there are numerous prominent diverticula. Suspect developing intramural phlegmon/abscess involving the sigmoid colon at this location measuring approximately 4.5 x 0.9 x 1.8 cm (series 2, image 68). Similar degree of fat stranding and adjacent free fluid. No organized extramural collections. No extraluminal air. Vascular/Lymphatic: No significant vascular findings are present. No enlarged abdominal or pelvic lymph nodes. Reproductive: Uterus  and right adnexa are unremarkable. Inflammatory changes in the pelvis closely approximate the left ovary, as previously seen. Other: No ascites.  No pneumoperitoneum. Musculoskeletal: No acute or significant osseous findings. IMPRESSION: Severe changes of colitis of the sigmoid colon where there are numerous prominent diverticula, slightly worsened compared to the previous CT. Suspect developing intramural phlegmon/abscess involving the sigmoid colon at this location measuring approximately 4.5 x 0.9 x 1.8 cm. No organized extramural collections. No extraluminal air. Electronically Signed   By: Davina Poke D.O.   On: 05/23/2022 15:08      Carmell Austria, MD 06/01/2022, 2:45 PM  Cc: [name], DO

## 2022-06-01 NOTE — Patient Instructions (Addendum)
_______________________________________________________  If your blood pressure at your visit was 140/90 or greater, please contact your primary care physician to follow up on this.  _______________________________________________________  If you are age 58 or older, your body mass index should be between 23-30. Your Body mass index is 21.67 kg/m. If this is out of the aforementioned range listed, please consider follow up with your Primary Care Provider.  If you are age 89 or younger, your body mass index should be between 19-25. Your Body mass index is 21.67 kg/m. If this is out of the aformentioned range listed, please consider follow up with your Primary Care Provider.   ________________________________________________________  The [facility] providers would like to encourage you to use [facility] to communicate with providers for non-urgent requests or questions.  Due to long hold times on the telephone, sending your provider a message by [facility] may be a faster and more efficient way to get a response.  Please allow 48 business hours for a response.  Please remember that this is for non-urgent requests.  _______________________________________________________  Your provider has requested that you go to the basement level for lab work before leaving today. Press "B" on the elevator. The lab is located at the first door on the left as you exit the elevator.  Low fiber diet until after antibiotics and start a high fiber diet  Please purchase the following medications over the counter and take as directed: Benefiber as directed  You will be contacted by our office prior to your procedure for directions on holding your blood thinner.  If you do not hear from our office 2 week prior to your scheduled procedure, please call [phone] to discuss.   Stop Wegovy 1 week prior to the procedure  You have been scheduled for a colonoscopy. Please follow written instructions given to you at your  visit today.  Please pick up your prep supplies at the pharmacy within the next 1-3 days. If you use inhalers (even only as needed), please bring them with you on the day of your procedure.  Thank you,  Dr. Jackquline Denmark

## 2022-06-01 NOTE — Telephone Encounter (Signed)
Dr Lindi Adie,  This patient is having an colonoscopy with Dr Lyndel Safe on 4-26 and we are asking if you are willing to allow this patient to stop her Xarelto 1 day prior to her procedure. Please advise as soon as you can. Thank you   [facility] Pre-operative Risk Assessment     Request for surgical clearance:     Endoscopy Procedure  What type of surgery is being performed?     Colon  When is this surgery scheduled?     07-27-2022  What type of clearance is required ?   Pharmacy  Are there any medications that need to be held prior to surgery and how long? Xarelto 1 day hold  Practice name and name of physician performing surgery?      [facility]  What is your office phone and fax number?      Phone- [phone]  [phone]  Anesthesia type (None, local, MAC, general) ?       MAC

## 2022-06-07 ENCOUNTER — Encounter: Payer: Self-pay | Admitting: *Deleted

## 2022-06-07 NOTE — Telephone Encounter (Signed)
Patient made aware to stop her blood thinner 07-26-2022 and to resume after the procedure date 07-27-2022 and she voiced understanding   Received approval from Roney Mans, RN that Dr gave the verbal order on 06-07-2022 for patient to hold xarelto 1 day prior to procedure

## 2022-06-07 NOTE — Progress Notes (Signed)
Received call from [facility] with [facility] requesting cardiac clearance related to Xarelto from MD for upcoming colonoscopy. Verbal orders received from MD for pt to stop Xarelto the day before scheduled colonoscopy and resume the day after.  [facility] notified of MD orders and verbalized understanding stating their office will update pt.

## 2022-06-11 ENCOUNTER — Ambulatory Visit: Payer: BC Managed Care – PPO

## 2022-06-14 ENCOUNTER — Ambulatory Visit: Payer: Self-pay | Admitting: *Deleted

## 2022-06-14 NOTE — Patient Outreach (Signed)
  Care Coordination   Initial Visit Note   07/17/2022 late entry for 06/14/22 Name: Stephanie Brewer MRN: [id number] DOB: 26-May-1965  Stephanie Brewer is a 57 y.o. year old female who sees Stephanie Ip, DO for primary care. I spoke with  Stephanie Brewer by phone today.  What matters to the patients health and wellness today?  Low BP taken on calf 100/82 Chemo completed  Appetite  started in [place] less than once a week  9/23 1st diverticulosis episode   poor appetite GI is following  Diabetes Last HgA1c was 6.3 on 05/23/22  Education Hypotension can be worse than a high one    Goals Addressed               This Visit's Progress     Patient Stated     Improve and maintain her weight and blood pressure to assist with healing from chemo & diverticulosis ([facility]) (pt-stated)   Not on track     Interventions Today    Flowsheet Row Most Recent Value  Chronic Disease   Chronic disease during today's visit Diabetes, Other  [pending GI surgery, diverticulosis, loss of weight/weight maintenance, hypotension (encouraged good hydration with low blood pressures)]  General Interventions   General Interventions Discussed/Reviewed General Interventions Discussed, Labs, Durable Medical Equipment (DME), Doctor Visits, Sick Day Rules  Labs Hgb A1c annually  [Listed as pre diabetes A1c range of 5.9 to 6.3]  Doctor Visits Discussed/Reviewed Doctor Visits Discussed, PCP, Specialist  Durable Medical Equipment (DME) BP Cuff  [encouraged to obtain a BP cuff to monitor for hypotension to prevent risks of falls +]  PCP/Specialist Visits Compliance with follow-up visit  Mental Health Interventions   Mental Health Discussed/Reviewed Mental Health Discussed, Coping Strategies  Nutrition Interventions   Nutrition Discussed/Reviewed Nutrition Discussed, Fluid intake, Increaing proteins, Supplmental nutrition  [encouraged use of high protein foods and supplements when appetite changes Discussed importance  of healthy body weight related to healing after procedures and surgery. encouraged a balance diet with variety]  Pharmacy Interventions   Pharmacy Dicussed/Reviewed Pharmacy Topics Discussed, Affording Medications  Safety Interventions   Safety Discussed/Reviewed Safety Discussed, Fall Risk              SDOH assessments and interventions completed:  Yes     Care Coordination Interventions:  Yes, provided   Follow up plan: Follow up call scheduled for 07/17/22    Encounter Outcome:  Pt. Visit Completed   Stephanie Brewer L. Noelle Penner, RN, BSN, CCM [facility] Care Management Community Coordinator Office number [phone]

## 2022-06-14 NOTE — Patient Instructions (Addendum)
Visit Information  Thank you for taking time to visit with me today. Please don't hesitate to contact me if I can be of assistance to you.   Following are the goals we discussed today:   Goals Addressed               This Visit's Progress     Patient Stated     Improve and maintain her weight and blood pressure to assist with healing from chemo & diverticulosis (THN) (pt-stated)   Not on track     Interventions Today    Flowsheet Row Most Recent Value  Chronic Disease   Chronic disease during today's visit Diabetes, Other  [pending GI surgery, diverticulosis, loss of weight/weight maintenance, hypotension (encouraged good hydration with low blood pressures)]  General Interventions   General Interventions Discussed/Reviewed General Interventions Discussed, Labs, Durable Medical Equipment (DME), Doctor Visits, Sick Day Rules  Labs Hgb A1c annually  [Listed as pre diabetes A1c range of 5.9 to 6.3]  Doctor Visits Discussed/Reviewed Doctor Visits Discussed, PCP, Specialist  Durable Medical Equipment (DME) BP Cuff  [encouraged to obtain a BP cuff to monitor for hypotension to prevent risks of falls +]  PCP/Specialist Visits Compliance with follow-up visit  Mental Health Interventions   Mental Health Discussed/Reviewed Mental Health Discussed, Coping Strategies  Nutrition Interventions   Nutrition Discussed/Reviewed Nutrition Discussed, Fluid intake, Increaing proteins, Supplmental nutrition  [encouraged use of high protein foods and supplements when appetite changes Discussed importance of healthy body weight related to healing after procedures and surgery. encouraged a balance diet with variety]  Pharmacy Interventions   Pharmacy Dicussed/Reviewed Pharmacy Topics Discussed, Affording Medications  Safety Interventions   Safety Discussed/Reviewed Safety Discussed, Fall Risk              Our next appointment is by telephone on 07/17/22 at 1000  Please call the care guide team at  [phone] if you need to cancel or reschedule your appointment.   If you are experiencing a Mental Health or Behavioral Health Crisis or need someone to talk to, please call the Suicide and Crisis Lifeline: 988 call the [place] National Suicide Prevention Lifeline: 714-854-8203 or TTY: 548 558 7395 TTY 541-845-2999) to talk to a trained counselor call 1-800-273-TALK (toll free, 24 hour hotline) call the [facility]: [phone] call 911   Patient verbalizes understanding of instructions and care plan provided today and agrees to view in MyChart. Active MyChart status and patient understanding of how to access instructions and care plan via MyChart confirmed with patient.     The patient has been provided with contact information for the care management team and has been advised to call with any health related questions or concerns.   Jaz Laningham L. Noelle Penner, RN, BSN, CCM [facility] Care Management Community Coordinator Office number [phone]

## 2022-06-19 ENCOUNTER — Encounter: Payer: Self-pay | Admitting: Family Medicine

## 2022-06-25 ENCOUNTER — Ambulatory Visit: Payer: BC Managed Care – PPO | Attending: Hematology and Oncology

## 2022-06-25 VITALS — Wt 124.5 lb

## 2022-06-25 DIAGNOSIS — Z483 Aftercare following surgery for neoplasm: Secondary | ICD-10-CM | POA: Insufficient documentation

## 2022-06-25 NOTE — Therapy (Signed)
OUTPATIENT PHYSICAL THERAPY SOZO SCREENING NOTE   Patient Name: Stephanie Brewer MRN: [id number] DOB:05-26-1965, 57 y.o., female Today's Date: 06/25/2022  PCP: [name], DO REFERRING PROVIDER: Nicholas Lose, MD   PT End of Session - 06/25/22 [phone]     Visit Number 2   # unchanged due to screen only   PT Start Time 0943    PT Stop Time 0948    PT Time Calculation (min) 5 min    Activity Tolerance Patient tolerated treatment well    Behavior During Therapy [facility] for tasks assessed/performed             Past Medical History:  Diagnosis Date   Breast cancer ([place])    Displacement of breast implant 12/24/2017   Diverticulitis    Diverticulosis    DVT (deep venous thrombosis) ([facility])    left leg knee and ankle    GERD (gastroesophageal reflux disease)    Kidney stones    Pneumonia    Prediabetes    Past Surgical History:  Procedure Laterality Date   BREAST ENHANCEMENT SURGERY     Augmentation 2003   BREAST IMPLANT REMOVAL Bilateral 01/14/2020   Procedure: REMOVAL BREAST IMPLANTS;  Surgeon: Cindra Presume, MD;  Location: [place];  Service: Plastics;  Laterality: Bilateral;   BREAST LUMPECTOMY WITH RADIOACTIVE SEED AND SENTINEL LYMPH NODE BIOPSY Right 01/14/2020   Procedure: Right breast seed localized lumpectomy with sentinel lymph node biopsy;  Surgeon: Rolm Bookbinder, MD;  Location: [place];  Service: General;  Laterality: Right;   LIPOSUCTION WITH LIPOFILLING Bilateral 11/29/2020   Procedure: LIPOSUCTION WITH LIPOFILLING FROM ABDOMEN TO BILATERAL BREASTS;  Surgeon: Cindra Presume, MD;  Location: [place];  Service: Plastics;  Laterality: Bilateral;   MASTOPEXY Left 11/29/2020   Procedure: LEFT MASTOPEXY;  Surgeon: Cindra Presume, MD;  Location: [place];  Service: Plastics;  Laterality: Left;   PORTACATH PLACEMENT N/A 09/01/2019   Procedure: INSERTION PORT-A-CATH WITH ULTRASOUND GUIDANCE;   Surgeon: Rolm Bookbinder, MD;  Location: [place];  Service: General;  Laterality: N/A;   TRIGGER FINGER RELEASE Left 05/09/2020   Procedure: RELEASE TRIGGER FINGER/A-1 PULLEY LEFT LONG;  Surgeon: Leanora Cover, MD;  Location: [place];  Service: Orthopedics;  Laterality: Left;   UMBILICAL HERNIA REPAIR  2002   Patient Active Problem List   Diagnosis Date Noted   Hypokalemia 05/27/2022   Normocytic anemia 05/26/2022   History of recurrent deep vein thrombosis (DVT) 05/26/2022   Pre-diabetes 05/23/2022   Pure hypercholesterolemia 05/23/2022   Recurrent acute diverticulitis with abscess/phlegmon 05/23/2022   Osteoporosis of femur without pathological fracture 11/15/2021   Overweight (BMI 25.0-29.9) 08/15/2021   Port-A-Cath in place 09/09/2019   Malignant neoplasm of upper-outer quadrant of right breast in female, estrogen receptor positive ([place]) 08/20/2019   Breast cancer metastasized to axillary lymph node, right ([place]) 08/18/2019   Trigger middle finger of left hand 02/10/2019   Radicular leg pain 02/04/2018   H/O bilateral breast implants 12/24/2017   GERD (gastroesophageal reflux disease) 12/06/2015   Gluten intolerance 12/06/2015    REFERRING DIAG: right breast cancer at risk for lymphedema  THERAPY DIAG:  Aftercare following surgery for neoplasm  PERTINENT HISTORY: R breast cancer, grade 2 invasive ductal carcinoma, ER +, PR-, HER2+, beginning chemo next week 09/02/19, 01/15/20- R lumpectomy and SLNB (4 nodes all negative), will begin radiation 02/15/20,  hx of LLE DVT following foot surgery for bunion, acute  DVT in LUE and in LLE diagnosed on 12/16/20   PRECAUTIONS: right UE Lymphedema risk, None  SUBJECTIVE: Pt returns for her 6 month L-Dex screen.   PAIN:  Are you having pain? No  SOZO SCREENING: Patient was assessed today using the SOZO machine to determine the lymphedema index score. This was compared to her baseline score. It was  determined that she is within the recommended range when compared to her baseline and no further action is needed at this time. She will continue SOZO screenings. These are done every 3 months for 2 years post operatively followed by every 6 months for 2 years, and then annually.   L-DEX FLOWSHEETS - 06/25/22 0900       L-DEX LYMPHEDEMA SCREENING   Measurement Type Unilateral    L-DEX MEASUREMENT EXTREMITY Upper Extremity    POSITION  Standing    DOMINANT SIDE Left    At Risk Side Right    BASELINE SCORE (UNILATERAL) 1.4    L-DEX SCORE (UNILATERAL) -3.5    VALUE CHANGE (UNILAT) -4.9              [name], PTA 06/25/2022, 9:48 AM

## 2022-07-16 ENCOUNTER — Ambulatory Visit: Payer: Self-pay | Admitting: Surgery

## 2022-07-16 DIAGNOSIS — Z86718 Personal history of other venous thrombosis and embolism: Secondary | ICD-10-CM | POA: Diagnosis not present

## 2022-07-16 DIAGNOSIS — Z853 Personal history of malignant neoplasm of breast: Secondary | ICD-10-CM | POA: Diagnosis not present

## 2022-07-16 DIAGNOSIS — K5792 Diverticulitis of intestine, part unspecified, without perforation or abscess without bleeding: Secondary | ICD-10-CM | POA: Diagnosis not present

## 2022-07-16 DIAGNOSIS — Z7901 Long term (current) use of anticoagulants: Secondary | ICD-10-CM | POA: Insufficient documentation

## 2022-07-17 ENCOUNTER — Ambulatory Visit: Payer: Self-pay | Admitting: *Deleted

## 2022-07-17 ENCOUNTER — Encounter: Payer: Self-pay | Admitting: *Deleted

## 2022-07-17 NOTE — Patient Instructions (Addendum)
Visit Information  Thank you for taking time to visit with me today. Please don't hesitate to contact me if I can be of assistance to you.   Following are the goals we discussed today:   Goals Addressed               This Visit's Progress     Patient Stated     Improve and maintain her weight and blood pressure to assist with healing from chemo & diverticulosis (THN) (pt-stated)   On track     Interventions Today    Flowsheet Row Most Recent Value  Chronic Disease   Chronic disease during today's visit Diabetes, Other  General Interventions   General Interventions Discussed/Reviewed General Interventions Reviewed, Doctor Visits  Doctor Visits Discussed/Reviewed Doctor Visits Reviewed  Education Interventions   Education Provided Provided Education  Provided Verbal Education On Other, Nutrition  [importance of intake of protein prior to and after procedure]  Mental Health Interventions   Mental Health Discussed/Reviewed Mental Health Reviewed  Nutrition Interventions   Nutrition Discussed/Reviewed Nutrition Reviewed, Increaing proteins  Pharmacy Interventions   Pharmacy Dicussed/Reviewed Pharmacy Topics Reviewed, Medications and their functions  Safety Interventions   Safety Discussed/Reviewed Safety Reviewed              Our next appointment is by telephone on 08/16/22 at 3 pm  Please call the care guide team at [phone] if you need to cancel or reschedule your appointment.   If you are experiencing a Mental Health or Behavioral Health Crisis or need someone to talk to, please call the Suicide and Crisis Lifeline: 988 call the [place] National Suicide Prevention Lifeline: (778) 821-8442 or TTY: 718-195-7469 TTY 919 341 8906) to talk to a trained counselor call 1-800-273-TALK (toll free, 24 hour hotline) call the [facility]: [phone] call 911   Patient verbalizes understanding of instructions and care plan provided today and agrees to view  in MyChart. Active MyChart status and patient understanding of how to access instructions and care plan via MyChart confirmed with patient.     The patient has been provided with contact information for the care management team and has been advised to call with any health related questions or concerns.   Kalyb Pemble L. Noelle Penner, RN, BSN, CCM [facility] Care Management Community Coordinator Office number [phone]

## 2022-07-17 NOTE — Patient Outreach (Signed)
  Care Coordination   Follow Up Visit Note   07/17/2022 Name: Stephanie Brewer MRN: [id number] DOB: [birth year]  Stephanie Brewer is a 57 y.o. year old female who sees Raliegh Ip, DO for primary care. I spoke with  Stephanie Brewer by phone today.  What matters to the patients health and wellness today?  Confirmed she met with [facility] on 07/16/22 (Dr Michaell Cowing)  She has consulted with her Leave of absence staff already She is aware to go to [facility] if she has an exacerbation of Gastric or oncology (breast cancer) symptoms  Has her port a cath in place  Her scale at home shows an increase of 3 pounds vs the office scales which indicates improved appetite. She will work on 6 small meals/snacks and increase fluid prior to surgery to assist with healing  Hypertension Did purchase a cuff and reports she is now monitoring her BP -Receiving an average of 125/72 She has noticed that once and a while she had symptoms of a low BP but did not have her BP cuff at work.  She confirms with any further episodes of this sort she will, rest and take in fluids She is encouraged to remain active, work on increasing intake of protein foods,  follow provider's recommendations for healing and returning to work     Goals Addressed               This Visit's Progress     Patient Stated     Improve and maintain her weight and blood pressure to assist with healing from chemo & diverticulosis (THN) (pt-stated)   On track     Interventions Today    Flowsheet Row Most Recent Value  Chronic Disease   Chronic disease during today's visit Diabetes, Other  General Interventions   General Interventions Discussed/Reviewed General Interventions Reviewed, Doctor Visits  Doctor Visits Discussed/Reviewed Doctor Visits Reviewed  Education Interventions   Education Provided Provided Education  Provided Verbal Education On Other, Nutrition  [importance of intake of protein prior to and after  procedure]  Mental Health Interventions   Mental Health Discussed/Reviewed Mental Health Reviewed  Nutrition Interventions   Nutrition Discussed/Reviewed Nutrition Reviewed, Increaing proteins  Pharmacy Interventions   Pharmacy Dicussed/Reviewed Pharmacy Topics Reviewed, Medications and their functions  Safety Interventions   Safety Discussed/Reviewed Safety Reviewed              SDOH assessments and interventions completed:  No  SDOH Interventions Today    Flowsheet Row Most Recent Value  SDOH Interventions   Housing Interventions Intervention Not Indicated  Transportation Interventions Intervention Not Indicated  Utilities Interventions Intervention Not Indicated  Physical Activity Interventions Intervention Not Indicated  Stress Interventions Intervention Not Indicated  Social Connections Interventions Intervention Not Indicated        Care Coordination Interventions:  Yes, provided   Follow up plan: Follow up call scheduled for 08/16/22    Encounter Outcome:  Pt. Visit Completed    Carden Teel L. Noelle Penner, RN, BSN, CCM [facility] Care Management Community Coordinator Office number [phone]

## 2022-07-19 ENCOUNTER — Encounter: Payer: Self-pay | Admitting: Gastroenterology

## 2022-07-27 ENCOUNTER — Encounter: Payer: Self-pay | Admitting: Gastroenterology

## 2022-07-27 ENCOUNTER — Ambulatory Visit (AMBULATORY_SURGERY_CENTER): Payer: BC Managed Care – PPO | Admitting: Gastroenterology

## 2022-07-27 VITALS — BP 157/88 | HR 78 | Temp 98.4°F | Resp 19 | Ht 64.0 in | Wt 126.0 lb

## 2022-07-27 DIAGNOSIS — K5792 Diverticulitis of intestine, part unspecified, without perforation or abscess without bleeding: Secondary | ICD-10-CM

## 2022-07-27 DIAGNOSIS — K5732 Diverticulitis of large intestine without perforation or abscess without bleeding: Secondary | ICD-10-CM | POA: Diagnosis not present

## 2022-07-27 MED ORDER — SODIUM CHLORIDE 0.9 % IV SOLN
500.0000 mL | Freq: Once | INTRAVENOUS | Status: DC
Start: 2022-07-27 — End: 2022-07-27

## 2022-07-27 MED ORDER — SODIUM CHLORIDE 0.9 % IV SOLN
4.0000 mg | Freq: Once | INTRAVENOUS | Status: AC
Start: 2022-07-27 — End: 2022-07-27
  Administered 2022-07-27: 4 mg via INTRAVENOUS

## 2022-07-27 NOTE — Progress Notes (Addendum)
Pt had nausea with vomiting upon arrival to recovery.  Vomited small amount of bile like liquid.  IV Zofran 4mg  given.    1136 Up to restroom to try to pass air.

## 2022-07-27 NOTE — Progress Notes (Signed)
07/27/2022 5:53 PM  -I called patient's husband Tim on his cell phone. -Patient has passed gas -No further nausea/vomiting -No fever or chills -Abdominal pain is better. -Does have burning sensation in the stomach   Plan: -Can take full liquid diet as tolerated today.  Then advance to normal diet in a.m. -Please get omeprazole 20 mg to be used once a day if any further heartburn -If any significant Abdo pain, fever/chills, to ED stat.  I doubt if she would need that. -She is scheduled for robotic sigmoid resection on Aug 29, 2022 with Dr. Bertrum Sol

## 2022-07-27 NOTE — Progress Notes (Signed)
Uneventful anesthetic. Report to pacu rn. Vss. Care resumed by rn. 

## 2022-07-27 NOTE — Op Note (Signed)
[facility] Patient Name: Stephanie Brewer Procedure Date: 07/27/2022 10:09 AM MRN: [id number] Endoscopist: Lynann Bologna , MD, [id number] Age: 57 Referring MD:  Date of Birth: [birth year] Gender: Female Account #: [id number] Procedure:                Colonoscopy Indications:              Screening for colorectal malignant neoplasm.                            History of recurrent diverticulitis. Medicines:                Monitored Anesthesia Care Procedure:                Pre-Anesthesia Assessment:                           - Prior to the procedure, a History and Physical                            was performed, and patient medications and                            allergies were reviewed. The patient's tolerance of                            previous anesthesia was also reviewed. The risks                            and benefits of the procedure and the sedation                            options and risks were discussed with the patient.                            All questions were answered, and informed consent                            was obtained. Prior Anticoagulants: The patient has                            taken Xarelto (rivaroxaban), last dose was 1 day                            prior to procedure. ASA Grade Assessment: II - A                            patient with mild systemic disease. After reviewing                            the risks and benefits, the patient was deemed in                            satisfactory condition to undergo the procedure.  After obtaining informed consent, the colonoscope                            was passed under direct vision. Throughout the                            procedure, the patient's blood pressure, pulse, and                            oxygen saturations were monitored continuously. The                            PCF-HQ190L Colonoscope was introduced through the                            anus  and advanced to mid sigmoid colon. It could                            not be passed beyond. We switched to ultraslim                            pediatric colonoscopy which was passed with some                            difficulty through the sigmoid stricture to the                            cecum. Thereafter 2 cm of terminal ileum was                            intubated. The colonoscopy was performed with                            difficulty due to abnormal anatomy and poor bowel                            prep. Successful completion of the procedure was                            aided by withdrawing the scope and replacing with                            the pediatric colonoscope. The patient tolerated                            the procedure well. The quality of the bowel                            preparation was fair. The terminal ileum, ileocecal                            valve, appendiceal orifice, and rectum were  photographed. Scope In: 10:21:44 AM Scope Out: 10:48:31 AM Scope Withdrawal Time: 0 hours 8 minutes 3 seconds  Total Procedure Duration: 0 hours 26 minutes 47 seconds  Findings:                 Multiple medium-mouthed diverticula were found in                            the sigmoid colon with tight sigmoid stricture                            (extending up to 35 cm from the anal verge on                            withdrawal of the scope). The sigmoid colon was                            also "fixed". This did not allow the passage of                            pediatric colonoscope. We switched to ultraslim                            colonoscope which was also passed with some                            difficulty through the stricture. The colon                            proximal to the stricture was dilated with a                            retained stool.                           A few medium-mouthed diverticula were found in the                             descending colon and transverse colon.                           The terminal ileum appeared normal.                           The exam was otherwise without abnormality on                            direct and retroflexion views. Limited due to                            quality of prep. No circumferential lesions were                            noted. Complications:            No immediate complications.  Estimated Blood Loss:     Estimated blood loss: none. Impression:               - Preparation of the colon was fair.                           - Severe sigmoid diverticulosis with sigmoid                            stricture-likely causing partial colonic obstruction                           - Mild pancolonic diverticulosis in the remaining                            colon.                           - The examined portion of the ileum was normal.                           - The examination was otherwise normal on direct                            and retroflexion views.                           - No specimens collected. Recommendation:           - Patient has a contact number available for                            emergencies. The signs and symptoms of potential                            delayed complications were discussed with the                            patient. Return to normal activities tomorrow.                            Written discharge instructions were provided to the                            patient.                           - Resume previous diet.                           - Continue present medications.                           - Resume Xarelto (rivaroxaban) at prior dose                            tomorrow.                           -  Patient has follow-up appointment with Dr. Michaell Cowing.                            Will try to expedient it. I do believe she would                            require sigmoid colectomy. Do recommend  colonoscopy                            after surgery since current colonoscopy had poor                            prep.                           - If acute problems, would recommend CT Abdo/pelvis                            through ED evaluation.                           - The findings and recommendations were discussed                            with the patient's family. Lynann Bologna, MD 07/27/2022 10:59:28 AM This report has been signed electronically.

## 2022-07-27 NOTE — Patient Instructions (Addendum)
- Resume previous diet.                           - Continue present medications.                           - Resume Xarelto (rivaroxaban) at prior dose                            tomorrow.                           - Patient has follow-up appointment with Dr. Michaell Cowing.                            Will try to expedient it. I do believe she would                            require sigmoid colectomy. Do recommend colonoscopy                            after surgery since current colonoscopy had poor                            prep.                           - If acute problems, would recommend CT Abdo/pelvis                            through ED evaluation.                           - The findings and recommendations were discussed                            with the patient's family.   YOU HAD AN ENDOSCOPIC PROCEDURE TODAY AT THE [facility]:   Refer to the procedure report that was given to you for any specific questions about what was found during the examination.  If the procedure report does not answer your questions, please call your gastroenterologist to clarify.  If you requested that your care partner not be given the details of your procedure findings, then the procedure report has been included in a sealed envelope for you to review at your convenience later.  YOU SHOULD EXPECT: Some feelings of bloating in the abdomen. Passage of more gas than usual.  Walking can help get rid of the air that was put into your GI tract during the procedure and reduce the bloating. If you had a lower endoscopy (such as a colonoscopy or flexible sigmoidoscopy) you may notice spotting of blood in your stool or on the toilet paper. If you underwent a bowel prep for your procedure, you may not have a normal bowel movement for a few days.  Please Note:  You might notice some irritation and congestion in your nose or some drainage.  This is from the oxygen used during your  procedure.  There is no need for concern  and it should clear up in a day or so.  SYMPTOMS TO REPORT IMMEDIATELY:  Following lower endoscopy (colonoscopy or flexible sigmoidoscopy):  Excessive amounts of blood in the stool  Significant tenderness or worsening of abdominal pains  Swelling of the abdomen that is new, acute  Fever of 100F or higher   For urgent or emergent issues, a gastroenterologist can be reached at any hour by calling [phone]. Do not use MyChart messaging for urgent concerns.    DIET:  We do recommend a small meal at first, but then you may proceed to your regular diet.  Drink plenty of fluids but you should avoid alcoholic beverages for 24 hours.  ACTIVITY:  You should plan to take it easy for the rest of today and you should NOT DRIVE or use heavy machinery until tomorrow (because of the sedation medicines used during the test).    FOLLOW UP: Our staff will call the number listed on your records the next business day following your procedure.  We will call around 7:15- 8:00 am to check on you and address any questions or concerns that you may have regarding the information given to you following your procedure. If we do not reach you, we will leave a message.     If any biopsies were taken you will be contacted by phone or by letter within the next 1-3 weeks.  Please call us at [phone] if you have not heard about the biopsies in 3 weeks.    SIGNATURES/CONFIDENTIALITY: You and/or your care partner have signed paperwork which will be entered into your electronic medical record.  These signatures attest to the fact that that the information above on your After Visit Summary has been reviewed and is understood.  Full responsibility of the confidentiality of this discharge information lies with you and/or your care-partner.

## 2022-07-27 NOTE — Progress Notes (Signed)
Chief Complaint: For colonoscopy  Referring Provider:  Raliegh Ip, DO      ASSESSMENT AND PLAN;   #1. Recurrent acute sigmoid diverticulitis (on augmentin until 3/6)   Plan: -CBC, CMP, CRP -Low fiber  diet. After she finishes A/Bs, high fiber/Benefiber QD -Colon with miralax in 6-8 weeks. Hold Xeralto 1 day before after clearence.  Hold Wegovy 7 days prior   Discussed risks & benefits of colonoscopy. Risks including rare perforation req laparotomy, bleeding after bx/polypectomy req blood transfusion, rarely missing neoplasms, risks of anesthesia/sedation, rare risk of damage to internal organs. Benefits outweigh the risks. Patient agrees to proceed. All the questions were answered. Pt consents to proceed.  HPI:    Stephanie Brewer is a 57 y.o. female  With H/O recurrent DVT on Xarelto, hx recurrent diverticulitis, and hx BrCA on anastrozole, prediabetes on Wegovy  FU adm 2/21-2/26 with Sigmoid Diverticulitis with Intramural abscess/phlegmon (4.5 cm, did not get IR drainage since it appeared to be more phlegmon, seen by surgery), treated with IV antibiotics then switched to p.o.  Augmentin. Still on augmentin until 3/6  Seen by surgery, advised colonoscopy.  She feels much better.  No fever/chills No abdo pain Denies having any diarrhea or constipation.  No NSAIDs  Denies having any upper GI symptoms including heartburn, nausea, vomiting.  No odynophagia or dysphagia.  Note that this is her third episode of sigmoid diverticulitis in September 2023.  I have reviewed CT scans independently.  She has follow-up appoint with Dr. Michaell Cowing.  No family history of colon cancer or colon polyps.   Previous GI workup: Her last colonoscopy was in 2016 at [facility]  (Dr Teena Dunk) showed diverticulosis and 3 small ulcers in her terminal ileum with bx results non-specific.   EGD 12/2015: Nl    Past Medical History:  Diagnosis Date   Breast cancer (HCC)     Clotting disorder (HCC)    Diabetes mellitus without complication (HCC)    Displacement of breast implant 12/24/2017   Diverticulitis    Diverticulosis    DVT (deep venous thrombosis) (HCC)    left leg knee and ankle    GERD (gastroesophageal reflux disease)    Kidney stones    Pneumonia    Prediabetes     Past Surgical History:  Procedure Laterality Date   BREAST ENHANCEMENT SURGERY     Augmentation 2003   BREAST IMPLANT REMOVAL Bilateral 01/14/2020   Procedure: REMOVAL BREAST IMPLANTS;  Surgeon: Allena Napoleon, MD;  Location: [facility];  Service: Plastics;  Laterality: Bilateral;   BREAST LUMPECTOMY WITH RADIOACTIVE SEED AND SENTINEL LYMPH NODE BIOPSY Right 01/14/2020   Procedure: Right breast seed localized lumpectomy with sentinel lymph node biopsy;  Surgeon: Emelia Loron, MD;  Location: [facility];  Service: General;  Laterality: Right;   LIPOSUCTION WITH LIPOFILLING Bilateral 11/29/2020   Procedure: LIPOSUCTION WITH LIPOFILLING FROM ABDOMEN TO BILATERAL BREASTS;  Surgeon: Allena Napoleon, MD;  Location: [facility];  Service: Plastics;  Laterality: Bilateral;   MASTOPEXY Left 11/29/2020   Procedure: LEFT MASTOPEXY;  Surgeon: Allena Napoleon, MD;  Location: [facility];  Service: Plastics;  Laterality: Left;   PORTACATH PLACEMENT N/A 09/01/2019   Procedure: INSERTION PORT-A-CATH WITH ULTRASOUND GUIDANCE;  Surgeon: Emelia Loron, MD;  Location: [facility];  Service: General;  Laterality: N/A;   TRIGGER FINGER RELEASE Left 05/09/2020   Procedure: RELEASE TRIGGER FINGER/A-1 PULLEY LEFT LONG;  Surgeon: Merlyn Lot,  Caryn Bee, MD;  Location: [facility];  Service: Orthopedics;  Laterality: Left;   UMBILICAL HERNIA REPAIR  2002    Family History  Problem Relation Age of Onset   Hyperlipidemia Mother    Polymyalgia rheumatica Mother    Hypertension Father    Hyperlipidemia Father    Diabetes  type I Brother    Ovarian cancer Maternal Aunt    Diabetes type II Maternal Grandfather    Allergies Daughter    Colon cancer Neg Hx    Esophageal cancer Neg Hx    Rectal cancer Neg Hx    Stomach cancer Neg Hx     Social History   Tobacco Use   Smoking status: Never   Smokeless tobacco: Never  Vaping Use   Vaping Use: Never used  Substance Use Topics   Alcohol use: Yes    Comment: 1 PER MONTH   Drug use: No    Current Outpatient Medications  Medication Sig Dispense Refill   anastrozole (ARIMIDEX) 1 MG tablet TAKE ONE TABLET ONCE DAILY (Patient taking differently: Take 1 mg by mouth at bedtime.) 90 tablet 3   glucose blood test strip UAD to check sugar. R73.03 100 each 12   Lancets Thin MISC UAD to check sugar. R73.03 100 each 12   Calcium Carb-Cholecalciferol (OYSTER SHELL CALCIUM W/D) 500-5 MG-MCG TABS Take 1 tablet by mouth daily. (Patient not taking: Reported on 06/01/2022)     ondansetron (ZOFRAN) 4 MG tablet Take 1 tablet (4 mg total) by mouth every 6 (six) hours as needed for nausea or vomiting. (Patient not taking: Reported on 06/01/2022) 20 tablet 0   Semaglutide-Weight Management (WEGOVY) 2.4 MG/0.75ML SOAJ Inject 2.4 mg into the skin every 7 (seven) days. (Patient taking differently: Inject 2.4 mg into the skin See admin instructions. Inject 2.4 mg into the skin every 7-28 days) 9 mL 3   TYLENOL 325 MG tablet Take 325 mg by mouth every 6 (six) hours as needed for headache or mild pain.     XARELTO 20 MG TABS tablet TAKE ONE TABLET ONCE DAILY WITH SUPPER (Patient taking differently: Take 20 mg by mouth in the morning.) 30 tablet 12   Current Facility-Administered Medications  Medication Dose Route Frequency Provider Last Rate Last Admin   0.9 %  sodium chloride infusion  500 mL Intravenous Once Lynann Bologna, MD        Allergies  Allergen Reactions   Hyoscyamine Anaphylaxis   Fosamax [Alendronate] Nausea Only and Other (See Comments)    SEVERE NAUSEA   Nexium  [Esomeprazole] Swelling and Other (See Comments)    Tongue became swollen- breathing not affected   Doxycycline Palpitations and Other (See Comments)    Racing heart    Review of Systems:  Constitutional: Denies fever, chills, diaphoresis, appetite change and fatigue.  HEENT: Denies photophobia, eye pain, redness, hearing loss, ear pain, congestion, sore throat, rhinorrhea, sneezing, mouth sores, neck pain, neck stiffness and tinnitus.   Respiratory: Denies SOB, DOE, cough, chest tightness,  and wheezing.   Cardiovascular: Denies chest pain, palpitations and leg swelling.  Genitourinary: Denies dysuria, urgency, frequency, hematuria, flank pain and difficulty urinating.  Musculoskeletal: Denies myalgias, back pain, joint swelling, arthralgias and gait problem.  Skin: No rash.  Neurological: Denies dizziness, seizures, syncope, weakness, light-headedness, numbness and headaches.  Hematological: Denies adenopathy. Easy bruising, personal or family bleeding history  Psychiatric/Behavioral: No anxiety or depression     Physical Exam:    BP 133/78   Pulse 85  Temp 98.4 F (36.9 C)   Ht 5\' 4"  (1.626 m)   Wt 126 lb (57.2 kg)   SpO2 100%   BMI 21.63 kg/m  Wt Readings from Last 3 Encounters:  07/27/22 126 lb (57.2 kg)  06/25/22 124 lb 8 oz (56.5 kg)  06/01/22 126 lb 4 oz (57.3 kg)   Constitutional:  Well-developed, in no acute distress. Psychiatric: Normal mood and affect. Behavior is normal. HEENT: Pupils normal.  Conjunctivae are normal. No scleral icterus. Cardiovascular: Normal rate, regular rhythm. No edema Pulmonary/chest: Effort normal and breath sounds normal. No wheezing, rales or rhonchi. Abdominal: Soft, nondistended. Nontender. Bowel sounds active throughout. There are no masses palpable. No hepatomegaly. Rectal: Deferred Neurological: Alert and oriented to person place and time. Skin: Skin is warm and dry. No rashes noted.  Data Reviewed: I have personally reviewed  following labs and imaging studies  CBC:    Latest Ref Rng & Units 06/01/2022    3:32 PM 05/27/2022    3:24 AM 05/26/2022    4:31 AM  CBC  WBC 4.0 - 10.5 K/uL 4.3  5.6  5.4   Hemoglobin 12.0 - 15.0 g/dL 40.9  81.1  91.4   Hematocrit 36.0 - 46.0 % 38.2  32.2  32.6   Platelets 150.0 - 400.0 K/uL 397.0  202  183     CMP:    Latest Ref Rng & Units 06/01/2022    3:32 PM 05/27/2022    3:24 AM 05/26/2022    4:31 AM  CMP  Glucose 70 - 99 mg/dL 88  782  95   BUN 6 - 23 mg/dL 16  6  5    Creatinine 0.40 - 1.20 mg/dL 9.56  2.13  0.86   Sodium 135 - 145 mEq/L 140  137  138   Potassium 3.5 - 5.1 mEq/L 3.8  3.3  3.6   Chloride 96 - 112 mEq/L 99  104  104   CO2 19 - 32 mEq/L 32  27  24   Calcium 8.4 - 10.5 mg/dL 9.7  8.3  8.6   Total Protein 6.0 - 8.3 g/dL 7.3     Total Bilirubin 0.2 - 1.2 mg/dL 0.2     Alkaline Phos 39 - 117 U/L 63     AST 0 - 37 U/L 10     ALT 0 - 35 U/L 7          Radiology Studies: No results found.    Edman Circle, MD 07/27/2022, 10:13 AM  Cc: Raliegh Ip, DO

## 2022-07-30 ENCOUNTER — Telehealth: Payer: Self-pay

## 2022-07-30 NOTE — Telephone Encounter (Signed)
Follow up call to pt, lm for pt to call if having any difficulty with normal activities or eating and drinking.  Also to call if any other questions or concerns.  

## 2022-08-01 HISTORY — PX: COLON RESECTION: SHX5231

## 2022-08-06 NOTE — Patient Instructions (Signed)
SURGICAL WAITING ROOM VISITATION Patients having surgery or a procedure may have no more than 2 support people in the waiting area - these visitors may rotate in the visitor waiting room.   Due to an increase in RSV and influenza rates and associated hospitalizations, children ages 63 and under may not visit patients in [facility] hospitals. If the patient needs to stay at the hospital during part of their recovery, the visitor guidelines for inpatient rooms apply.  PRE-OP VISITATION  Pre-op nurse will coordinate an appropriate time for 1 support person to accompany the patient in pre-op.  This support person may not rotate.  This visitor will be contacted when the time is appropriate for the visitor to come back in the pre-op area.  Please refer to the [facility] website for the visitor guidelines for Inpatients (after your surgery is over and you are in a regular room).  You are not required to quarantine at this time prior to your surgery. However, you must do this: Hand Hygiene often Do NOT share personal items Notify your provider if you are in close contact with someone who has COVID or you develop fever 100.4 or greater, new onset of sneezing, cough, sore throat, shortness of breath or body aches.  If you test positive for Covid or have been in contact with anyone that has tested positive in the last 10 days please notify you surgeon.    Your procedure is scheduled on:  Friday  Aug 17, 2022  Report to [facility] Main Entrance: [address] entrance where the [employer] is available.   Report to admitting at: 09:00 AM  +++++Call this number if you have any questions or problems the morning of surgery [phone]  Change your diet to clear liquids the day prior to your surgery / procedure.  After Midnight you may have the following liquids until   08:15   AM / PM DAY OF SURGERY  Clear Liquid Diet Water Black Coffee (sugar ok, NO MILK/CREAM OR CREAMERS)  Tea (sugar ok,  NO MILK/CREAM OR CREAMERS) regular and decaf                             Plain Jell-O  with no fruit (NO RED)                                           Fruit ices (not with fruit pulp, NO RED)                                     Popsicles (NO RED)                                                                  Juice: apple, WHITE grape, WHITE cranberry Sports drinks like Gatorade or Powerade (NO RED)             DRINK two (2) bottles of Pre-Surgery G2 starting at 6:00 pm the evening prior to your surgery to help prevent dehydration. Increase drinking clear fluids (see  list above)              The day of surgery:  Drink ONE (1) Pre-Surgery  G2 at 08:15 AM the morning of surgery. Drink in one sitting. Do not sip.  This drink was given to you during your hospital pre-op appointment visit. Nothing else to drink after completing the Pre-Surgery Clear Ensure or G2 : No candy, chewing gum or throat lozenges.    FOLLOW BOWEL PREP AND ANY ADDITIONAL PRE OP INSTRUCTIONS YOU RECEIVED FROM YOUR SURGEON'S OFFICE!!!  Dulcolax 20 mg (total) - Take 4 (four) of the 5 mg Dulcolax tablets with water at 07:00 am the day prior to surgery.  Miralax 255 g - Mix with 64 oz Gatorade/Powerade.  Starting at 10:00 am ,Drink this gradually over the next few hours (8 oz glass every 15-30 minutes) until gone the day prior to surgery You should finish in 4 hours-6 hours.    Neomycin 1000 mg - At 2 pm, 3 pm and 10 pm after Miralax  bowel prep the day prior to surgery.  Metronidazole 1000 mg - At 2 pm, 3 pm and 10 pm after Miralax bowel prep the day prior to surgery.   Drink plenty of clear liquids all evening to avoid getting dehydrated.     Oral Hygiene is also important to reduce your risk of infection.        Remember - BRUSH YOUR TEETH THE MORNING OF SURGERY WITH YOUR REGULAR TOOTHPASTE  Do NOT smoke after Midnight the night before surgery.  Take ONLY these medicines the morning of surgery with A SIP OF WATER:  none   You may not have any metal on your body including hair pins, jewelry, and body piercing  Do not wear make-up, lotions, powders, perfumes / cologne, or deodorant  Do not wear nail polish including gel and S&S, artificial / acrylic nails, or any other type of covering on natural nails including finger and toenails. If you have artificial nails, gel coating, etc., that needs to be removed by a nail salon, Please have this removed prior to surgery. Not doing so may mean that your surgery could be cancelled or delayed if the Surgeon or anesthesia staff feels like they are unable to monitor you safely.   Do not shave 48 hours prior to surgery to avoid nicks in your skin which may contribute to postoperative infections.   Contacts, Hearing Aids, dentures or bridgework may not be worn into surgery.   You may bring a small overnight bag with you on the day of surgery, only pack items that are not valuable. [facility] IS NOT RESPONSIBLE   FOR VALUABLES THAT ARE LOST OR STOLEN.   Do not bring your home medications to the hospital. The Pharmacy will dispense medications listed on your medication list to you during your admission in the Hospital.  Special Instructions: Bring a copy of your healthcare power of attorney and living will documents the day of surgery, if you wish to have them scanned into your [facility] Records- EPIC  Please read over the following fact sheets you were given: IF YOU HAVE QUESTIONS ABOUT YOUR PRE-OP INSTRUCTIONS, PLEASE CALL [phone].   [facility] - Preparing for Surgery Before surgery, you can play an important role.  Because skin is not sterile, your skin needs to be as free of germs as possible.  You can reduce the number of germs on your skin by washing with CHG (chlorahexidine gluconate) soap before surgery.  CHG is an antiseptic cleaner which kills germs and bonds with the skin to continue killing germs even after washing. Please DO NOT use if you  have an allergy to CHG or antibacterial soaps.  If your skin becomes reddened/irritated stop using the CHG and inform your nurse when you arrive at Short Stay. Do not shave (including legs and underarms) for at least 48 hours prior to the first CHG shower.  You may shave your face/neck.  Please follow these instructions carefully:  1.  Shower with CHG Soap the night before surgery and the  morning of surgery.  2.  If you choose to wash your hair, wash your hair first as usual with your normal  shampoo.  3.  After you shampoo, rinse your hair and body thoroughly to remove the shampoo.                             4.  Use CHG as you would any other liquid soap.  You can apply chg directly to the skin and wash.  Gently with a scrungie or clean washcloth.  5.  Apply the CHG Soap to your body ONLY FROM THE NECK DOWN.   Do not use on face/ open                           Wound or open sores. Avoid contact with eyes, ears mouth and genitals (private parts).                       Wash face,  Genitals (private parts) with your normal soap.             6.  Wash thoroughly, paying special attention to the area where your  surgery  will be performed.  7.  Thoroughly rinse your body with warm water from the neck down.  8.  DO NOT shower/wash with your normal soap after using and rinsing off the CHG Soap.            9.  Pat yourself dry with a clean towel.            10.  Wear clean pajamas.            11.  Place clean sheets on your bed the night of your first shower and do not  sleep with pets.  ON THE DAY OF SURGERY : Do not apply any lotions/deodorants the morning of surgery.  Please wear clean clothes to the hospital/surgery center.    FAILURE TO FOLLOW THESE INSTRUCTIONS MAY RESULT IN THE CANCELLATION OF YOUR SURGERY  PATIENT SIGNATURE_________________________________  NURSE SIGNATURE__________________________________  ________________________________________________________________________         Stephanie Brewer    An incentive spirometer is a tool that can help keep your lungs clear and active. This tool measures how well you are filling your lungs with each breath. Taking long deep breaths may help reverse or decrease the chance of developing breathing (pulmonary) problems (especially infection) following: A long period of time when you are unable to move or be active. BEFORE THE PROCEDURE  If the spirometer includes an indicator to show your best effort, your nurse or respiratory therapist will set it to a desired goal. If possible, sit up straight or lean slightly forward. Try not to slouch. Hold the incentive spirometer in an upright position. INSTRUCTIONS FOR USE  Sit on the  edge of your bed if possible, or sit up as far as you can in bed or on a chair. Hold the incentive spirometer in an upright position. Breathe out normally. Place the mouthpiece in your mouth and seal your lips tightly around it. Breathe in slowly and as deeply as possible, raising the piston or the ball toward the top of the column. Hold your breath for 3-5 seconds or for as long as possible. Allow the piston or ball to fall to the bottom of the column. Remove the mouthpiece from your mouth and breathe out normally. Rest for a few seconds and repeat Steps 1 through 7 at least 10 times every 1-2 hours when you are awake. Take your time and take a few normal breaths between deep breaths. The spirometer may include an indicator to show your best effort. Use the indicator as a goal to work toward during each repetition. After each set of 10 deep breaths, practice coughing to be sure your lungs are clear. If you have an incision (the cut made at the time of surgery), support your incision when coughing by placing a pillow or rolled up towels firmly against it. Once you are able to get out of bed, walk around indoors and cough well. You may stop using the incentive spirometer when instructed by  your caregiver.  RISKS AND COMPLICATIONS Take your time so you do not get dizzy or light-headed. If you are in pain, you may need to take or ask for pain medication before doing incentive spirometry. It is harder to take a deep breath if you are having pain. AFTER USE Rest and breathe slowly and easily. It can be helpful to keep track of a log of your progress. Your caregiver can provide you with a simple table to help with this. If you are using the spirometer at home, follow these instructions: SEEK MEDICAL CARE IF:  You are having difficultly using the spirometer. You have trouble using the spirometer as often as instructed. Your pain medication is not giving enough relief while using the spirometer. You develop fever of 100.5 F (38.1 C) or higher.                                                                                                    SEEK IMMEDIATE MEDICAL CARE IF:  You cough up bloody sputum that had not been present before. You develop fever of 102 F (38.9 C) or greater. You develop worsening pain at or near the incision site. MAKE SURE YOU:  Understand these instructions. Will watch your condition. Will get help right away if you are not doing well or get worse. Document Released: 07/30/2006 Document Revised: 06/11/2011 Document Reviewed: 09/30/2006 [facility] Patient Information 2014 [place], [place].

## 2022-08-06 NOTE — Progress Notes (Signed)
COVID Vaccine received:  [x]  No []  Yes Date of any COVID positive Test in last 90 days:  none  PCP - Delynn Flavin, DO Cardiologist - none Oncology- Serena Croissant, MD  Chest x-ray - 06-02-2020  2v  Epic EKG -  will do at PST Stress Test -  ECHO - 05-31-2020  Epic Cardiac Cath -   PCR screen: []  Ordered & Completed           [x]   No Order but Needs PROFEND           []   N/A for this surgery  Surgery Plan:  []  Ambulatory                            []  Outpatient in bed                            [x]  Admit  Anesthesia:    [x]  General  []  Spinal                           []   Choice []   MAC  Bowel Prep - []  No  [x]   Yes _Neomycin, Flagyl etc,  Patient has this prep information and voiced understanding.   Pacemaker / ICD device [x]  No []  Yes   Spinal Cord Stimulator:[x]  No []  Yes       History of Sleep Apnea? [x]  No []  Yes   CPAP used?- [x]  No []  Yes    Does the patient monitor blood sugar?          []  No [x]  Yes  []  N/A Last A1c: 05-23-2022 ?6.3  Patient has: []  NO Hx DM   [x]  Pre-DM                 []  DM1  []   DM2 Does patient have a [employer] or Dexacom? [x]  No []  Yes   Fasting Blood Sugar Ranges- 95-120 Checks Blood Sugar __2_ times a week  GLP1 agonist / usual dose -  [id number]- not taking regularly each week.  GLP1 instructions: Hold x 7 days  Patient states her last dose was on 07-18-22 and she will not inject again until after her surgery.  Other Diabetic medications/ instructions: none  Blood Thinner / Instructions:  Xarelto   hold x 2-3 days  Patient is aware.   Aspirin Instructions: none  ERAS Protocol Ordered: []  No  [x]  Yes PRE-SURGERY []  ENSURE  [x]  G2 x3 Patient is to be NPO after: 0815  Activity level: Patient is able to climb a flight of stairs without difficulty; [x]  No CP  [x]  No SOB.   Patient can perform ADLs without assistance.   Anesthesia review: GERD, Right breast cancer (current chemo), Pre-DM, anemia, recurrent DVT- clotting  disorder  Patient denies shortness of breath, fever, cough and chest pain at PAT appointment.  Patient verbalized understanding and agreement to the Pre-Surgical Instructions that were given to them at this PAT appointment. Patient was also educated of the need to review these PAT instructions again prior to her surgery.I reviewed the appropriate phone numbers to call if they have any and questions or concerns.

## 2022-08-07 ENCOUNTER — Encounter (HOSPITAL_COMMUNITY): Payer: Self-pay

## 2022-08-07 ENCOUNTER — Other Ambulatory Visit: Payer: Self-pay

## 2022-08-07 ENCOUNTER — Encounter (HOSPITAL_COMMUNITY)
Admission: RE | Admit: 2022-08-07 | Discharge: 2022-08-07 | Disposition: A | Discharge status: Home / Self Care | Payer: BC Managed Care – PPO | Source: Ambulatory Visit | Attending: Surgery | Admitting: Surgery

## 2022-08-07 VITALS — BP 106/75 | HR 78 | Temp 98.5°F | Resp 16 | Ht 64.0 in | Wt 124.0 lb

## 2022-08-07 DIAGNOSIS — Z7901 Long term (current) use of anticoagulants: Secondary | ICD-10-CM | POA: Diagnosis not present

## 2022-08-07 DIAGNOSIS — R7303 Prediabetes: Secondary | ICD-10-CM | POA: Insufficient documentation

## 2022-08-07 DIAGNOSIS — Z01818 Encounter for other preprocedural examination: Secondary | ICD-10-CM | POA: Diagnosis not present

## 2022-08-07 HISTORY — DX: Anemia, unspecified: D64.9

## 2022-08-07 HISTORY — DX: Personal history of urinary calculi: Z87.442

## 2022-08-07 HISTORY — DX: Prediabetes: R73.03

## 2022-08-07 HISTORY — DX: Anxiety disorder, unspecified: F41.9

## 2022-08-07 LAB — BASIC METABOLIC PANEL
Anion gap: 5 (ref 5–15)
BUN: 12 mg/dL (ref 6–20)
CO2: 27 mmol/L (ref 22–32)
Calcium: 9 mg/dL (ref 8.9–10.3)
Chloride: 105 mmol/L (ref 98–111)
Creatinine, Ser: 0.64 mg/dL (ref 0.44–1.00)
GFR, Estimated: >60 mL/min (ref >60)
Glucose, Bld: 98 mg/dL (ref 70–99)
Potassium: 3.9 mmol/L (ref 3.5–5.1)
Sodium: 137 mmol/L (ref 135–145)

## 2022-08-07 LAB — CBC
HCT: 39.2 % (ref 36.0–46.0)
Hemoglobin: 12.7 g/dL (ref 12.0–15.0)
MCH: 29.5 pg (ref 26.0–34.0)
MCHC: 32.4 g/dL (ref 30.0–36.0)
MCV: 91 fL (ref 80.0–100.0)
Platelets: 268 10*3/uL (ref 150–400)
RBC: 4.31 MIL/uL (ref 3.87–5.11)
RDW: 13.5 % (ref 11.5–15.5)
WBC: 4.2 10*3/uL (ref 4.0–10.5)
nRBC: 0 % (ref 0.0–0.2)

## 2022-08-07 LAB — HEMOGLOBIN A1C
Hgb A1c MFr Bld: 5.5 % (ref 4.8–5.6)
Mean Plasma Glucose: 111.15 mg/dL

## 2022-08-16 ENCOUNTER — Ambulatory Visit: Payer: Self-pay | Admitting: *Deleted

## 2022-08-16 ENCOUNTER — Ambulatory Visit
Admission: RE | Admit: 2022-08-16 | Discharge: 2022-08-16 | Disposition: A | Discharge status: Home / Self Care | Payer: BC Managed Care – PPO | Source: Ambulatory Visit | Attending: Hematology and Oncology | Admitting: Hematology and Oncology

## 2022-08-16 DIAGNOSIS — Z853 Personal history of malignant neoplasm of breast: Secondary | ICD-10-CM | POA: Diagnosis not present

## 2022-08-16 DIAGNOSIS — Z170 Estrogen receptor positive status [ER+]: Secondary | ICD-10-CM

## 2022-08-16 NOTE — Patient Outreach (Signed)
  Care Coordination   Follow Up Visit Note   06/25/2023 updated note for 08/16/22 Name: Stephanie Brewer MRN: [id number] DOB: [birth year]  Stephanie Brewer is a 57 y.o. year old female who sees Raliegh Ip, DO for primary care. I spoke with  [name] by phone today.  What matters to the patients health and wellness today?  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 ([facility]) (pt-stated)        Interventions Today    Flowsheet Row Most Recent Value  General Interventions   General Interventions Discussed/Reviewed General Interventions Reviewed, Doctor Visits  [surgery, hypotension on 08/08/22, hypertension, nausea]  Doctor Visits Discussed/Reviewed Doctor Visits Reviewed, Specialist  PCP/Specialist Visits Compliance with follow-up visit  Education Interventions   Education Provided Provided Education  [hypotension/low BP precautions]  Provided Verbal Education On Sick Day Rules, Other, When to see the doctor, Medication  Mental Health Interventions   Mental Health Discussed/Reviewed Mental Health Reviewed, Coping Strategies  Nutrition Interventions   Nutrition Discussed/Reviewed Nutrition Reviewed, Fluid intake  Pharmacy Interventions   Pharmacy Dicussed/Reviewed Pharmacy Topics Reviewed, Medications and their functions  Safety Interventions   Safety Discussed/Reviewed Safety Discussed, Home Safety  Home Safety Assistive Devices              SDOH assessments and interventions completed:  Yes     Care Coordination Interventions:  Yes, provided   Follow up plan: Follow up call scheduled for pending upcoming surgery    Encounter Outcome:  Pt. Visit Completed   Keland Peyton L. Noelle Penner, RN, BSN, CCM [facility] RN  Care Manager [phone]

## 2022-08-17 ENCOUNTER — Encounter (HOSPITAL_COMMUNITY): Payer: Self-pay | Admitting: Surgery

## 2022-08-17 ENCOUNTER — Inpatient Hospital Stay (HOSPITAL_COMMUNITY): Payer: BC Managed Care – PPO | Admitting: Physician Assistant

## 2022-08-17 ENCOUNTER — Encounter (HOSPITAL_COMMUNITY)
Admission: RE | Disposition: A | Discharge status: Home / Self Care | Payer: Self-pay | Source: Ambulatory Visit | Attending: Surgery

## 2022-08-17 ENCOUNTER — Other Ambulatory Visit: Payer: Self-pay

## 2022-08-17 ENCOUNTER — Inpatient Hospital Stay (HOSPITAL_COMMUNITY): Payer: BC Managed Care – PPO | Admitting: Certified Registered Nurse Anesthetist

## 2022-08-17 ENCOUNTER — Inpatient Hospital Stay (HOSPITAL_COMMUNITY)
Admission: RE | Admit: 2022-08-17 | Discharge: 2022-08-20 | DRG: 330 | Disposition: A | Discharge status: Home / Self Care | Payer: BC Managed Care – PPO | Source: Ambulatory Visit | Attending: Surgery | Admitting: Surgery

## 2022-08-17 DIAGNOSIS — Z9221 Personal history of antineoplastic chemotherapy: Secondary | ICD-10-CM | POA: Diagnosis not present

## 2022-08-17 DIAGNOSIS — Z79811 Long term (current) use of aromatase inhibitors: Secondary | ICD-10-CM | POA: Diagnosis not present

## 2022-08-17 DIAGNOSIS — Z888 Allergy status to other drugs, medicaments and biological substances status: Secondary | ICD-10-CM | POA: Diagnosis not present

## 2022-08-17 DIAGNOSIS — Z86718 Personal history of other venous thrombosis and embolism: Secondary | ICD-10-CM | POA: Diagnosis not present

## 2022-08-17 DIAGNOSIS — C50411 Malignant neoplasm of upper-outer quadrant of right female breast: Secondary | ICD-10-CM | POA: Diagnosis present

## 2022-08-17 DIAGNOSIS — D649 Anemia, unspecified: Secondary | ICD-10-CM | POA: Diagnosis present

## 2022-08-17 DIAGNOSIS — K9041 Non-celiac gluten sensitivity: Secondary | ICD-10-CM | POA: Diagnosis not present

## 2022-08-17 DIAGNOSIS — K5732 Diverticulitis of large intestine without perforation or abscess without bleeding: Secondary | ICD-10-CM | POA: Diagnosis not present

## 2022-08-17 DIAGNOSIS — K219 Gastro-esophageal reflux disease without esophagitis: Secondary | ICD-10-CM | POA: Diagnosis present

## 2022-08-17 DIAGNOSIS — Z9011 Acquired absence of right breast and nipple: Secondary | ICD-10-CM

## 2022-08-17 DIAGNOSIS — Z853 Personal history of malignant neoplasm of breast: Secondary | ICD-10-CM | POA: Diagnosis not present

## 2022-08-17 DIAGNOSIS — F419 Anxiety disorder, unspecified: Secondary | ICD-10-CM | POA: Diagnosis present

## 2022-08-17 DIAGNOSIS — Z975 Presence of (intrauterine) contraceptive device: Secondary | ICD-10-CM

## 2022-08-17 DIAGNOSIS — Z01818 Encounter for other preprocedural examination: Secondary | ICD-10-CM

## 2022-08-17 DIAGNOSIS — Z8249 Family history of ischemic heart disease and other diseases of the circulatory system: Secondary | ICD-10-CM

## 2022-08-17 DIAGNOSIS — E7800 Pure hypercholesterolemia, unspecified: Secondary | ICD-10-CM | POA: Diagnosis present

## 2022-08-17 DIAGNOSIS — Z170 Estrogen receptor positive status [ER+]: Secondary | ICD-10-CM | POA: Diagnosis not present

## 2022-08-17 DIAGNOSIS — R7303 Prediabetes: Principal | ICD-10-CM | POA: Diagnosis present

## 2022-08-17 DIAGNOSIS — Z881 Allergy status to other antibiotic agents status: Secondary | ICD-10-CM | POA: Diagnosis not present

## 2022-08-17 DIAGNOSIS — R110 Nausea: Secondary | ICD-10-CM | POA: Diagnosis not present

## 2022-08-17 DIAGNOSIS — Z7985 Long-term (current) use of injectable non-insulin antidiabetic drugs: Secondary | ICD-10-CM

## 2022-08-17 DIAGNOSIS — Z7901 Long term (current) use of anticoagulants: Secondary | ICD-10-CM | POA: Diagnosis not present

## 2022-08-17 DIAGNOSIS — K5730 Diverticulosis of large intestine without perforation or abscess without bleeding: Secondary | ICD-10-CM | POA: Diagnosis not present

## 2022-08-17 HISTORY — PX: PROCTOSCOPY: SHX2266

## 2022-08-17 HISTORY — DX: Diverticulitis of large intestine without perforation or abscess without bleeding: K57.32

## 2022-08-17 LAB — GLUCOSE, CAPILLARY: Glucose-Capillary: 104 mg/dL — ABNORMAL HIGH (ref 70–99)

## 2022-08-17 LAB — TYPE AND SCREEN
ABO/RH(D): O NEG
Antibody Screen: NEGATIVE

## 2022-08-17 SURGERY — COLECTOMY, PARTIAL, ROBOT-ASSISTED, LAPAROSCOPIC
Anesthesia: General

## 2022-08-17 MED ORDER — PROCHLORPERAZINE EDISYLATE 10 MG/2ML IJ SOLN: 5.0000 mg | Freq: Four times a day (QID) | INTRAMUSCULAR | Status: DC | PRN

## 2022-08-17 MED ORDER — LIDOCAINE HCL (PF) 2 % IJ SOLN
INTRAMUSCULAR | Status: AC
  Filled 2022-08-17: qty 5

## 2022-08-17 MED ORDER — SODIUM CHLORIDE 0.9 % IV SOLN
2.0000 g | Freq: Two times a day (BID) | INTRAVENOUS | Status: AC
  Administered 2022-08-17: 2 g via INTRAVENOUS
  Filled 2022-08-17: qty 2

## 2022-08-17 MED ORDER — ALUM & MAG HYDROXIDE-SIMETH 200-200-20 MG/5ML PO SUSP: 30.0000 mL | Freq: Four times a day (QID) | ORAL | Status: DC | PRN

## 2022-08-17 MED ORDER — ACETAMINOPHEN 500 MG PO TABS
1000.0000 mg | ORAL_TABLET | ORAL | Status: AC
  Administered 2022-08-17: 1000 mg via ORAL
  Filled 2022-08-17: qty 2

## 2022-08-17 MED ORDER — LIP MEDEX EX OINT
TOPICAL_OINTMENT | Freq: Two times a day (BID) | CUTANEOUS | Status: DC
  Administered 2022-08-17: 75 via TOPICAL
  Filled 2022-08-17 (×3): qty 7

## 2022-08-17 MED ORDER — DIPHENHYDRAMINE HCL 50 MG/ML IJ SOLN: 12.5000 mg | Freq: Four times a day (QID) | INTRAMUSCULAR | Status: DC | PRN

## 2022-08-17 MED ORDER — POLYETHYLENE GLYCOL 3350 17 GM/SCOOP PO POWD
1.0000 | Freq: Once | ORAL | Status: DC
  Filled 2022-08-17: qty 255

## 2022-08-17 MED ORDER — BUPIVACAINE LIPOSOME 1.3 % IJ SUSP
INTRAMUSCULAR | Status: AC
  Filled 2022-08-17: qty 20

## 2022-08-17 MED ORDER — BUPIVACAINE LIPOSOME 1.3 % IJ SUSP: 20.0000 mL | Freq: Once | INTRAMUSCULAR | Status: DC

## 2022-08-17 MED ORDER — ROCURONIUM BROMIDE 10 MG/ML (PF) SYRINGE
PREFILLED_SYRINGE | INTRAVENOUS | Status: AC
  Filled 2022-08-17: qty 10

## 2022-08-17 MED ORDER — PHENOL 1.4 % MT LIQD: 2.0000 | OROMUCOSAL | Status: DC | PRN

## 2022-08-17 MED ORDER — TRAMADOL HCL 50 MG PO TABS
50.0000 mg | ORAL_TABLET | Freq: Four times a day (QID) | ORAL | Status: DC | PRN
  Administered 2022-08-17: 50 mg via ORAL
  Administered 2022-08-18 (×3): 100 mg via ORAL
  Administered 2022-08-19: 50 mg via ORAL
  Administered 2022-08-19 (×2): 100 mg via ORAL
  Filled 2022-08-17: qty 1
  Filled 2022-08-17 (×6): qty 2

## 2022-08-17 MED ORDER — ENSURE PRE-SURGERY PO LIQD
296.0000 mL | Freq: Once | ORAL | Status: DC
  Filled 2022-08-17: qty 296

## 2022-08-17 MED ORDER — MENTHOL 3 MG MT LOZG: 1.0000 | LOZENGE | OROMUCOSAL | Status: DC | PRN

## 2022-08-17 MED ORDER — ORAL CARE MOUTH RINSE: 15.0000 mL | Freq: Once | OROMUCOSAL | Status: AC

## 2022-08-17 MED ORDER — FENTANYL CITRATE (PF) 250 MCG/5ML IJ SOLN
INTRAMUSCULAR | Status: DC | PRN
  Administered 2022-08-17 (×2): 50 ug via INTRAVENOUS
  Administered 2022-08-17: 100 ug via INTRAVENOUS
  Administered 2022-08-17: 50 ug via INTRAVENOUS

## 2022-08-17 MED ORDER — METRONIDAZOLE 500 MG PO TABS
1000.0000 mg | ORAL_TABLET | ORAL | Status: DC
  Filled 2022-08-17: qty 2

## 2022-08-17 MED ORDER — AMISULPRIDE (ANTIEMETIC) 5 MG/2ML IV SOLN: 10.0000 mg | Freq: Once | INTRAVENOUS | Status: DC | PRN

## 2022-08-17 MED ORDER — TRAMADOL HCL 50 MG PO TABS: 50.0000 mg | ORAL_TABLET | Freq: Four times a day (QID) | ORAL | 0 refills | Status: DC | PRN

## 2022-08-17 MED ORDER — FENTANYL CITRATE (PF) 250 MCG/5ML IJ SOLN
INTRAMUSCULAR | Status: AC
  Filled 2022-08-17: qty 5

## 2022-08-17 MED ORDER — SIMETHICONE 80 MG PO CHEW
40.0000 mg | CHEWABLE_TABLET | Freq: Four times a day (QID) | ORAL | Status: DC | PRN
  Administered 2022-08-18: 40 mg via ORAL
  Filled 2022-08-17: qty 1

## 2022-08-17 MED ORDER — MIDAZOLAM HCL 2 MG/2ML IJ SOLN
INTRAMUSCULAR | Status: DC | PRN
  Administered 2022-08-17: 2 mg via INTRAVENOUS

## 2022-08-17 MED ORDER — ALVIMOPAN 12 MG PO CAPS
12.0000 mg | ORAL_CAPSULE | ORAL | Status: AC
  Administered 2022-08-17: 12 mg via ORAL
  Filled 2022-08-17: qty 1

## 2022-08-17 MED ORDER — NEOMYCIN SULFATE 500 MG PO TABS
1000.0000 mg | ORAL_TABLET | ORAL | Status: DC
  Filled 2022-08-17: qty 2

## 2022-08-17 MED ORDER — CHLORHEXIDINE GLUCONATE 0.12 % MT SOLN
15.0000 mL | Freq: Once | OROMUCOSAL | Status: AC
  Administered 2022-08-17: 15 mL via OROMUCOSAL

## 2022-08-17 MED ORDER — ENSURE PRE-SURGERY PO LIQD
592.0000 mL | Freq: Once | ORAL | Status: DC
  Filled 2022-08-17: qty 592

## 2022-08-17 MED ORDER — MIDAZOLAM HCL 2 MG/2ML IJ SOLN
INTRAMUSCULAR | Status: AC
  Filled 2022-08-17: qty 2

## 2022-08-17 MED ORDER — DIPHENHYDRAMINE HCL 12.5 MG/5ML PO ELIX: 12.5000 mg | ORAL_SOLUTION | Freq: Four times a day (QID) | ORAL | Status: DC | PRN

## 2022-08-17 MED ORDER — METHOCARBAMOL 500 MG PO TABS
1000.0000 mg | ORAL_TABLET | Freq: Four times a day (QID) | ORAL | Status: DC | PRN
  Administered 2022-08-17 – 2022-08-19 (×5): 1000 mg via ORAL
  Filled 2022-08-17 (×5): qty 2

## 2022-08-17 MED ORDER — ONDANSETRON HCL 4 MG PO TABS: 4.0000 mg | ORAL_TABLET | Freq: Four times a day (QID) | ORAL | Status: DC | PRN

## 2022-08-17 MED ORDER — BUPIVACAINE LIPOSOME 1.3 % IJ SUSP
INTRAMUSCULAR | Status: DC | PRN
  Administered 2022-08-17: 50 mL

## 2022-08-17 MED ORDER — SUGAMMADEX SODIUM 200 MG/2ML IV SOLN
INTRAVENOUS | Status: DC | PRN
  Administered 2022-08-17: 200 mg via INTRAVENOUS

## 2022-08-17 MED ORDER — ACETAMINOPHEN 500 MG PO TABS
1000.0000 mg | ORAL_TABLET | Freq: Four times a day (QID) | ORAL | Status: DC
  Administered 2022-08-17 – 2022-08-20 (×12): 1000 mg via ORAL
  Filled 2022-08-17 (×12): qty 2

## 2022-08-17 MED ORDER — PROCHLORPERAZINE MALEATE 10 MG PO TABS: 10.0000 mg | ORAL_TABLET | Freq: Four times a day (QID) | ORAL | Status: DC | PRN

## 2022-08-17 MED ORDER — ALBUMIN HUMAN 5 % IV SOLN
12.5000 g | Freq: Once | INTRAVENOUS | Status: AC
  Administered 2022-08-17: 12.5 g via INTRAVENOUS

## 2022-08-17 MED ORDER — INDOCYANINE GREEN 25 MG IV SOLR
INTRAVENOUS | Status: DC | PRN
  Administered 2022-08-17: 7.5 mg via INTRAVENOUS

## 2022-08-17 MED ORDER — ALVIMOPAN 12 MG PO CAPS
12.0000 mg | ORAL_CAPSULE | Freq: Two times a day (BID) | ORAL | Status: DC
  Administered 2022-08-18 – 2022-08-19 (×4): 12 mg via ORAL
  Filled 2022-08-17 (×4): qty 1

## 2022-08-17 MED ORDER — SODIUM CHLORIDE 0.9 % IV SOLN
2.0000 g | INTRAVENOUS | Status: AC
  Administered 2022-08-17: 2 g via INTRAVENOUS
  Filled 2022-08-17: qty 2

## 2022-08-17 MED ORDER — ROCURONIUM BROMIDE 10 MG/ML (PF) SYRINGE
PREFILLED_SYRINGE | INTRAVENOUS | Status: DC | PRN
  Administered 2022-08-17: 40 mg via INTRAVENOUS
  Administered 2022-08-17: 20 mg via INTRAVENOUS

## 2022-08-17 MED ORDER — SODIUM CHLORIDE 0.9% FLUSH
3.0000 mL | Freq: Two times a day (BID) | INTRAVENOUS | Status: DC
  Administered 2022-08-18 – 2022-08-20 (×5): 3 mL via INTRAVENOUS

## 2022-08-17 MED ORDER — 0.9 % SODIUM CHLORIDE (POUR BTL) OPTIME
TOPICAL | Status: DC | PRN
  Administered 2022-08-17: 1000 mL

## 2022-08-17 MED ORDER — DEXAMETHASONE SODIUM PHOSPHATE 10 MG/ML IJ SOLN
INTRAMUSCULAR | Status: AC
  Filled 2022-08-17: qty 1

## 2022-08-17 MED ORDER — BISACODYL 5 MG PO TBEC: 20.0000 mg | DELAYED_RELEASE_TABLET | Freq: Once | ORAL | Status: DC

## 2022-08-17 MED ORDER — ENALAPRILAT 1.25 MG/ML IV SOLN: 0.6250 mg | Freq: Four times a day (QID) | INTRAVENOUS | Status: DC | PRN

## 2022-08-17 MED ORDER — LIDOCAINE 2% (20 MG/ML) 5 ML SYRINGE
INTRAMUSCULAR | Status: DC | PRN
  Administered 2022-08-17: 60 mg via INTRAVENOUS

## 2022-08-17 MED ORDER — ENSURE SURGERY PO LIQD
237.0000 mL | Freq: Two times a day (BID) | ORAL | Status: DC
  Administered 2022-08-17 – 2022-08-19 (×3): 237 mL via ORAL

## 2022-08-17 MED ORDER — PROPOFOL 10 MG/ML IV BOLUS
INTRAVENOUS | Status: DC | PRN
  Administered 2022-08-17: 120 mg via INTRAVENOUS

## 2022-08-17 MED ORDER — MELATONIN 3 MG PO TABS: 3.0000 mg | ORAL_TABLET | Freq: Every evening | ORAL | Status: DC | PRN

## 2022-08-17 MED ORDER — HYDROMORPHONE HCL 1 MG/ML IJ SOLN: 0.5000 mg | INTRAMUSCULAR | Status: DC | PRN

## 2022-08-17 MED ORDER — ONDANSETRON HCL 4 MG/2ML IJ SOLN
INTRAMUSCULAR | Status: DC | PRN
  Administered 2022-08-17: 4 mg via INTRAVENOUS

## 2022-08-17 MED ORDER — LACTATED RINGERS IV SOLN: INTRAVENOUS | Status: AC

## 2022-08-17 MED ORDER — ENOXAPARIN SODIUM 40 MG/0.4ML IJ SOSY
40.0000 mg | PREFILLED_SYRINGE | Freq: Once | INTRAMUSCULAR | Status: AC
  Administered 2022-08-17: 40 mg via SUBCUTANEOUS
  Filled 2022-08-17: qty 0.4

## 2022-08-17 MED ORDER — METHOCARBAMOL 1000 MG/10ML IJ SOLN: 1000.0000 mg | Freq: Four times a day (QID) | INTRAVENOUS | Status: DC | PRN

## 2022-08-17 MED ORDER — INDOCYANINE GREEN 25 MG IV SOLR
INTRAVENOUS | Status: AC
  Filled 2022-08-17: qty 10

## 2022-08-17 MED ORDER — FENTANYL CITRATE PF 50 MCG/ML IJ SOSY: 25.0000 ug | PREFILLED_SYRINGE | INTRAMUSCULAR | Status: DC | PRN

## 2022-08-17 MED ORDER — METOPROLOL TARTRATE 5 MG/5ML IV SOLN: 5.0000 mg | Freq: Four times a day (QID) | INTRAVENOUS | Status: DC | PRN

## 2022-08-17 MED ORDER — DEXAMETHASONE SODIUM PHOSPHATE 10 MG/ML IJ SOLN
INTRAMUSCULAR | Status: DC | PRN
  Administered 2022-08-17: 10 mg via INTRAVENOUS

## 2022-08-17 MED ORDER — ONDANSETRON HCL 4 MG/2ML IJ SOLN
INTRAMUSCULAR | Status: AC
  Filled 2022-08-17: qty 2

## 2022-08-17 MED ORDER — MAGIC MOUTHWASH: 15.0000 mL | Freq: Four times a day (QID) | ORAL | Status: DC | PRN

## 2022-08-17 MED ORDER — GABAPENTIN 300 MG PO CAPS
300.0000 mg | ORAL_CAPSULE | ORAL | Status: AC
  Administered 2022-08-17: 300 mg via ORAL
  Filled 2022-08-17: qty 1

## 2022-08-17 MED ORDER — LACTATED RINGERS IV BOLUS: 1000.0000 mL | Freq: Three times a day (TID) | INTRAVENOUS | Status: AC | PRN

## 2022-08-17 MED ORDER — SODIUM CHLORIDE 0.9% FLUSH: 3.0000 mL | INTRAVENOUS | Status: DC | PRN

## 2022-08-17 MED ORDER — HYDRALAZINE HCL 20 MG/ML IJ SOLN: 10.0000 mg | INTRAMUSCULAR | Status: DC | PRN

## 2022-08-17 MED ORDER — LACTATED RINGERS IR SOLN
Status: DC | PRN
  Administered 2022-08-17: 1000 mL

## 2022-08-17 MED ORDER — ALBUMIN HUMAN 5 % IV SOLN
INTRAVENOUS | Status: AC
  Filled 2022-08-17: qty 250

## 2022-08-17 MED ORDER — SODIUM CHLORIDE 0.9 % IV BOLUS
500.0000 mL | Freq: Once | INTRAVENOUS | Status: AC
  Administered 2022-08-17: 500 mL via INTRAVENOUS

## 2022-08-17 MED ORDER — ONDANSETRON HCL 4 MG/2ML IJ SOLN
4.0000 mg | Freq: Four times a day (QID) | INTRAMUSCULAR | Status: DC | PRN
  Administered 2022-08-19: 4 mg via INTRAVENOUS
  Filled 2022-08-17: qty 2

## 2022-08-17 MED ORDER — BUPIVACAINE HCL (PF) 0.25 % IJ SOLN
INTRAMUSCULAR | Status: AC
  Filled 2022-08-17: qty 30

## 2022-08-17 MED ORDER — LACTATED RINGERS IV SOLN: INTRAVENOUS | Status: DC

## 2022-08-17 MED ORDER — PROPOFOL 10 MG/ML IV BOLUS
INTRAVENOUS | Status: AC
  Filled 2022-08-17: qty 20

## 2022-08-17 MED ORDER — CELECOXIB 200 MG PO CAPS
200.0000 mg | ORAL_CAPSULE | ORAL | Status: AC
  Administered 2022-08-17: 200 mg via ORAL
  Filled 2022-08-17: qty 1

## 2022-08-17 MED ORDER — SODIUM CHLORIDE 0.9 % IV SOLN: 250.0000 mL | INTRAVENOUS | Status: DC | PRN

## 2022-08-17 MED ORDER — ENOXAPARIN SODIUM 40 MG/0.4ML IJ SOSY
40.0000 mg | PREFILLED_SYRINGE | INTRAMUSCULAR | Status: DC
  Administered 2022-08-18 – 2022-08-19 (×2): 40 mg via SUBCUTANEOUS
  Filled 2022-08-17 (×2): qty 0.4

## 2022-08-17 SURGICAL SUPPLY — 109 items
APL PRP STRL LF DISP 70% ISPRP (MISCELLANEOUS)
APPLIER CLIP 5 13 M/L LIGAMAX5 (MISCELLANEOUS)
APPLIER CLIP ROT 10 11.4 M/L (STAPLE)
APR CLP MED LRG 11.4X10 (STAPLE)
APR CLP MED LRG 5 ANG JAW (MISCELLANEOUS)
BAG COUNTER SPONGE SURGICOUNT (BAG) ×1 IMPLANT
BAG SPNG CNTER NS LX DISP (BAG) ×1
BLADE EXTENDED COATED 6.5IN (ELECTRODE) IMPLANT
CANNULA REDUCER 12-8 DVNC XI (CANNULA) IMPLANT
CELLS DAT CNTRL 66122 CELL SVR (MISCELLANEOUS) IMPLANT
CHLORAPREP W/TINT 26 (MISCELLANEOUS) IMPLANT
CLIP APPLIE 5 13 M/L LIGAMAX5 (MISCELLANEOUS) IMPLANT
CLIP APPLIE ROT 10 11.4 M/L (STAPLE) IMPLANT
COVER SURGICAL LIGHT HANDLE (MISCELLANEOUS) ×2 IMPLANT
COVER TIP SHEARS 8 DVNC (MISCELLANEOUS) ×1 IMPLANT
DEFOGGER SCOPE WARMER CLEARIFY (MISCELLANEOUS) IMPLANT
DEVICE TROCAR PUNCTURE CLOSURE (ENDOMECHANICALS) IMPLANT
DRAIN CHANNEL 19F RND (DRAIN) IMPLANT
DRAPE ARM DVNC X/XI (DISPOSABLE) ×4 IMPLANT
DRAPE COLUMN DVNC XI (DISPOSABLE) ×2 IMPLANT
DRAPE SURG IRRIG POUCH 19X23 (DRAPES) ×1 IMPLANT
DRIVER NDL LRG 8 DVNC XI (INSTRUMENTS) ×2 IMPLANT
DRIVER NDLE LRG 8 DVNC XI (INSTRUMENTS) ×1 IMPLANT
DRSG OPSITE POSTOP 4X10 (GAUZE/BANDAGES/DRESSINGS) IMPLANT
DRSG OPSITE POSTOP 4X6 (GAUZE/BANDAGES/DRESSINGS) IMPLANT
DRSG OPSITE POSTOP 4X8 (GAUZE/BANDAGES/DRESSINGS) IMPLANT
DRSG TEGADERM 2-3/8X2-3/4 SM (GAUZE/BANDAGES/DRESSINGS) ×5 IMPLANT
DRSG TEGADERM 4X4.75 (GAUZE/BANDAGES/DRESSINGS) IMPLANT
ELECT PENCIL ROCKER SW 15FT (MISCELLANEOUS) ×2 IMPLANT
ELECT REM PT RETURN 15FT ADLT (MISCELLANEOUS) ×1 IMPLANT
ENDOLOOP SUT PDS II  0 18 (SUTURE)
ENDOLOOP SUT PDS II 0 18 (SUTURE) IMPLANT
EVACUATOR SILICONE 100CC (DRAIN) IMPLANT
GAUZE SPONGE 2X2 8PLY STRL LF (GAUZE/BANDAGES/DRESSINGS) ×1 IMPLANT
GLOVE ECLIPSE 8.0 STRL XLNG CF (GLOVE) ×3 IMPLANT
GLOVE INDICATOR 8.0 STRL GRN (GLOVE) ×3 IMPLANT
GOWN SRG XL LVL 4 BRTHBL STRL (GOWNS) ×1 IMPLANT
GOWN STRL NON-REIN XL LVL4 (GOWNS) ×1
GOWN STRL REUS W/ TWL XL LVL3 (GOWN DISPOSABLE) ×4 IMPLANT
GOWN STRL REUS W/TWL XL LVL3 (GOWN DISPOSABLE) ×4
GRASPER SUT TROCAR 14GX15 (MISCELLANEOUS) IMPLANT
GRASPER TIP-UP FEN DVNC XI (INSTRUMENTS) ×2 IMPLANT
HOLDER FOLEY CATH W/STRAP (MISCELLANEOUS) ×1 IMPLANT
IRRIG SUCT STRYKERFLOW 2 WTIP (MISCELLANEOUS) ×1
IRRIGATION SUCT STRKRFLW 2 WTP (MISCELLANEOUS) ×1 IMPLANT
KIT PROCEDURE DVNC SI (MISCELLANEOUS) IMPLANT
KIT SIGMOIDOSCOPE (SET/KITS/TRAYS/PACK) IMPLANT
KIT TURNOVER KIT A (KITS) IMPLANT
NDL INSUFFLATION 14GA 120MM (NEEDLE) ×1 IMPLANT
NEEDLE INSUFFLATION 14GA 120MM (NEEDLE) ×1 IMPLANT
PACK CARDIOVASCULAR III (CUSTOM PROCEDURE TRAY) ×1 IMPLANT
PACK COLON (CUSTOM PROCEDURE TRAY) ×1 IMPLANT
PAD POSITIONING PINK XL (MISCELLANEOUS) ×1 IMPLANT
PROTECTOR NERVE ULNAR (MISCELLANEOUS) ×2 IMPLANT
RELOAD STAPLE 45 3.5 BLU DVNC (STAPLE) IMPLANT
RELOAD STAPLE 45 4.3 GRN DVNC (STAPLE) IMPLANT
RELOAD STAPLE 60 3.5 BLU DVNC (STAPLE) IMPLANT
RELOAD STAPLE 60 4.3 GRN DVNC (STAPLE) IMPLANT
RELOAD STAPLER 3.5X45 BLU DVNC (STAPLE) IMPLANT
RELOAD STAPLER 3.5X60 BLU DVNC (STAPLE) IMPLANT
RELOAD STAPLER 4.3X45 GRN DVNC (STAPLE) IMPLANT
RELOAD STAPLER 4.3X60 GRN DVNC (STAPLE) ×2 IMPLANT
RETRACTOR WND ALEXIS 18 MED (MISCELLANEOUS) IMPLANT
RTRCTR WOUND ALEXIS 18CM MED (MISCELLANEOUS)
SCISSORS LAP 5X35 DISP (ENDOMECHANICALS) ×1 IMPLANT
SCISSORS MNPLR CVD DVNC XI (INSTRUMENTS) ×1 IMPLANT
SEAL UNIV 5-12 XI (MISCELLANEOUS) ×4 IMPLANT
SEALER VESSEL EXT DVNC XI (MISCELLANEOUS) ×1 IMPLANT
SOL ELECTROSURG ANTI STICK (MISCELLANEOUS) ×1
SOLUTION ELECTROSURG ANTI STCK (MISCELLANEOUS) ×1 IMPLANT
SPIKE FLUID TRANSFER (MISCELLANEOUS) ×1 IMPLANT
STAPLER 45 SUREFORM DVNC (STAPLE) IMPLANT
STAPLER 60 SUREFORM DVNC (STAPLE) IMPLANT
STAPLER ECHELON POWER CIR 29 (STAPLE) IMPLANT
STAPLER ECHELON POWER CIR 31 (STAPLE) IMPLANT
STAPLER RELOAD 3.5X45 BLU DVNC (STAPLE)
STAPLER RELOAD 3.5X60 BLU DVNC (STAPLE)
STAPLER RELOAD 4.3X45 GRN DVNC (STAPLE)
STAPLER RELOAD 4.3X60 GRN DVNC (STAPLE) ×2
STOPCOCK 4 WAY LG BORE MALE ST (IV SETS) ×2 IMPLANT
SURGILUBE 2OZ TUBE FLIPTOP (MISCELLANEOUS) IMPLANT
SUT MNCRL AB 4-0 PS2 18 (SUTURE) ×1 IMPLANT
SUT PDS AB 1 CT1 27 (SUTURE) ×2 IMPLANT
SUT PROLENE 0 CT 2 (SUTURE) IMPLANT
SUT PROLENE 2 0 KS (SUTURE) IMPLANT
SUT PROLENE 2 0 SH DA (SUTURE) IMPLANT
SUT SILK 2 0 (SUTURE) ×1
SUT SILK 2 0 SH CR/8 (SUTURE) IMPLANT
SUT SILK 2-0 18XBRD TIE 12 (SUTURE) IMPLANT
SUT SILK 3 0 (SUTURE) ×1
SUT SILK 3 0 SH CR/8 (SUTURE) ×1 IMPLANT
SUT SILK 3-0 18XBRD TIE 12 (SUTURE) IMPLANT
SUT V-LOC BARB 180 2/0GR6 GS22 (SUTURE)
SUT VIC AB 3-0 SH 18 (SUTURE) IMPLANT
SUT VIC AB 3-0 SH 27 (SUTURE)
SUT VIC AB 3-0 SH 27XBRD (SUTURE) IMPLANT
SUT VICRYL 0 UR6 27IN ABS (SUTURE) ×2 IMPLANT
SUTURE V-LC BRB 180 2/0GR6GS22 (SUTURE) IMPLANT
SYR 20ML ECCENTRIC (SYRINGE) ×1 IMPLANT
SYS LAPSCP GELPORT 120MM (MISCELLANEOUS)
SYS WOUND ALEXIS 18CM MED (MISCELLANEOUS) ×1
SYSTEM LAPSCP GELPORT 120MM (MISCELLANEOUS) IMPLANT
SYSTEM WOUND ALEXIS 18CM MED (MISCELLANEOUS) ×1 IMPLANT
TOWEL OR NON WOVEN STRL DISP B (DISPOSABLE) ×1 IMPLANT
TRAY FOLEY MTR SLVR 14FR STAT (SET/KITS/TRAYS/PACK) IMPLANT
TRAY FOLEY MTR SLVR 16FR STAT (SET/KITS/TRAYS/PACK) ×1 IMPLANT
TROCAR ADV FIXATION 5X100MM (TROCAR) ×1 IMPLANT
TUBING CONNECTING 10 (TUBING) ×2 IMPLANT
TUBING INSUFFLATION 10FT LAP (TUBING) ×1 IMPLANT

## 2022-08-17 NOTE — Transfer of Care (Signed)
Immediate Anesthesia Transfer of Care Note  Patient: Stephanie Brewer  Procedure(s) Performed: ROBOTIC LOW ANTERIOR RESECTION, BILATERAL TAP BLOCK, AND INTRAOPERATIVE ASSESSMENT OF PERFUSION USING FIREFLY DYE RIGID PROCTOSCOPY  Patient Location: PACU  Anesthesia Type:General  Level of Consciousness: awake and alert   Airway & Oxygen Therapy: Patient Spontanous Breathing and Patient connected to face mask oxygen  Post-op Assessment: Report given to RN and Post -op Vital signs reviewed and stable  Post vital signs: Reviewed and stable  Last Vitals:  Vitals Value Taken Time  BP 142/81 08/17/22 1327  Temp    Pulse 69 08/17/22 1329  Resp 11 08/17/22 1329  SpO2 100 % 08/17/22 1329  Vitals shown include unvalidated device data.  Last Pain:  Vitals:   08/17/22 0903  TempSrc: Oral  PainSc: 0-No pain         Complications: No notable events documented.

## 2022-08-17 NOTE — Discharge Instructions (Addendum)
SURGERY: POST OP INSTRUCTIONS (Surgery for small bowel obstruction, colon resection, etc)   ######################################################################  EAT Gradually transition to a high fiber diet with a fiber supplement over the next few days after discharge  WALK Walk an hour a day.  Control your pain to do that.    CONTROL PAIN Control pain so that you can walk, sleep, tolerate sneezing/coughing, go up/down stairs.  HAVE A BOWEL MOVEMENT DAILY Keep your bowels regular to avoid problems.  OK to try a laxative to override constipation.  OK to use an antidairrheal to slow down diarrhea.  Call if not better after 2 tries  CALL IF YOU HAVE PROBLEMS/CONCERNS Call if you are still struggling despite following these instructions. Call if you have concerns not answered by these instructions  ######################################################################   DIET Follow a light diet the first few days at home.  Start with a bland diet such as soups, liquids, starchy foods, low fat foods, etc.  If you feel full, bloated, or constipated, stay on a ful liquid or pureed/blenderized diet for a few days until you feel better and no longer constipated. Be sure to drink plenty of fluids every day to avoid getting dehydrated (feeling dizzy, not urinating, etc.). Gradually add a fiber supplement to your diet over the next week.  Gradually get back to a regular solid diet.  Avoid fast food or heavy meals the first week as you are more likely to get nauseated. It is expected for your digestive tract to need a few months to get back to normal.  It is common for your bowel movements and stools to be irregular.  You will have occasional bloating and cramping that should eventually fade away.  Until you are eating solid food normally, off all pain medications, and back to regular activities; your bowels will not be normal. Focus on eating a low-fat, high fiber diet the rest of your life  (See Getting to Good Bowel Health, below).  CARE of your INCISION or WOUND  It is good for closed incisions and even open wounds to be washed every day.  Shower every day.  Short baths are fine.  Wash the incisions and wounds clean with soap & water.    You may leave closed incisions open to air if it is dry.   You may cover the incision with clean gauze & replace it after your daily shower for comfort.  TEGADERM:  You have clear gauze band-aid dressings over your closed incision(s).  Remove the dressings 3 days after surgery. = 5/20 MONDAY    ACTIVITIES as tolerated Start light daily activities --- self-care, walking, climbing stairs-- beginning the day after surgery.  Gradually increase activities as tolerated.  Control your pain to be active.  Stop when you are tired.  Ideally, walk several times a day, eventually an hour a day.   Most people are back to most day-to-day activities in a few weeks.  It takes 4-8 weeks to get back to unrestricted, intense activity. If you can walk 30 minutes without difficulty, it is safe to try more intense activity such as jogging, treadmill, bicycling, low-impact aerobics, swimming, etc. Save the most intensive and strenuous activity for last (Usually 4-8 weeks after surgery) such as sit-ups, heavy lifting, contact sports, etc.  Refrain from any intense heavy lifting or straining until you are off narcotics for pain control.  You will have off days, but things should improve week-by-week. DO NOT PUSH THROUGH PAIN.  Let pain be your guide:  If it hurts to do something, don't do it.  Pain is your body warning you to avoid that activity for another week until the pain goes down. You may drive when you are no longer taking narcotic prescription pain medication, you can comfortably wear a seatbelt, and you can safely make sudden turns/stops to protect yourself without hesitating due to pain. You may have sexual intercourse when it is comfortable. If it hurts to do  something, stop.   MEDICATIONS Take your usually prescribed home medications unless otherwise directed.   Blood thinners:  You can restart any strong blood thinners after the second postoperative day = 08/19/2022 Sunday.  It is OK to continue aspirin before & after surgery..    Some blood in BMs the first 1-2 weeks is common but should taper down & be small volume.  If you are passing many large clots, call your surgeon    PAIN CONTROL Pain after surgery or related to activity is often due to strain/injury to muscle, tendon, nerves and/or incisions.  This pain is usually short-term and will improve in a few months.  To help speed the process of healing and to get back to regular activity more quickly, DO THE FOLLOWING THINGS TOGETHER: Increase activity gradually.  DO NOT PUSH THROUGH PAIN Use Ice and/or Heat Try Gentle Massage and/or Stretching Take over the counter pain medication Take Narcotic prescription pain medication for more severe pain  Good pain control = faster recovery.  It is better to take more medicine to be more active than to stay in bed all day to avoid medications.  Increase activity gradually Avoid heavy lifting at first, then increase to lifting as tolerated over the next 6 weeks. Do not "push through" the pain.  Listen to your body and avoid positions and maneuvers than reproduce the pain.  Wait a few days before trying something more intense Walking an hour a day is encouraged to help your body recover faster and more safely.  Start slowly and stop when getting sore.  If you can walk 30 minutes without stopping or pain, you can try more intense activity (running, jogging, aerobics, cycling, swimming, treadmill, sex, sports, weightlifting, etc.) Remember: If it hurts to do it, then don't do it! Use Ice and/or Heat You will have swelling and bruising around the incisions.  This will take several weeks to resolve. Ice packs or heating pads (6-8 times a day, 30-60  minutes at a time) will help sooth soreness & bruising. Some people prefer to use ice alone, heat alone, or alternate between ice & heat.  Experiment and see what works best for you.  Consider trying ice for the first few days to help decrease swelling and bruising; then, switch to heat to help relax sore spots and speed recovery. Shower every day.  Short baths are fine.  It feels good!  Keep the incisions and wounds clean with soap & water.   Try Gentle Massage and/or Stretching Massage at the area of pain many times a day Stop if you feel pain - do not overdo it Take over the counter pain medication This helps the muscle and nerve tissues become less irritable and calm down faster Choose ONE of the following over-the-counter anti-inflammatory medications: Acetaminophen 500mg  tabs (Tylenol) 1-2 pills with every meal and just before bedtime (avoid if you have liver problems or if you have acetaminophen in you narcotic prescription) Naproxen 220mg  tabs (ex. Aleve, Naprosyn) 1-2 pills twice a day (avoid if you have  kidney, stomach, IBD, or bleeding problems) Ibuprofen 200mg  tabs (ex. Advil, Motrin) 3-4 pills with every meal and just before bedtime (avoid if you have kidney, stomach, IBD, or bleeding problems) Take with food/snack several times a day as directed for at least 2 weeks to help keep pain / soreness down & more manageable. Take Narcotic prescription pain medication for more severe pain A prescription for strong pain control is often given to you upon discharge (for example: oxycodone/Percocet, hydrocodone/Norco/Vicodin, or tramadol/Ultram) Take your pain medication as prescribed. Be mindful that most narcotic prescriptions contain Tylenol (acetaminophen) as well - avoid taking too much Tylenol. If you are having problems/concerns with the prescription medicine (does not control pain, nausea, vomiting, rash, itching, etc.), please call us [phone] to see if we need to switch you to a  different pain medicine that will work better for you and/or control your side effects better. If you need a refill on your pain medication, you must call the office before 4 pm and on weekdays only.  By federal law, prescriptions for narcotics cannot be called into a pharmacy.  They must be filled out on paper & picked up from our office by the patient or authorized caretaker.  Prescriptions cannot be filled after 4 pm nor on weekends.    WHEN TO CALL us [phone] Severe uncontrolled or worsening pain  Fever over 101 F (38.5 C) Concerns with the incision: Worsening pain, redness, rash/hives, swelling, bleeding, or drainage Reactions / problems with new medications (itching, rash, hives, nausea, etc.) Nausea and/or vomiting Difficulty urinating Difficulty breathing Worsening fatigue, dizziness, lightheadedness, blurred vision Other concerns If you are not getting better after two weeks or are noticing you are getting worse, contact our office [phone] for further advice.  We may need to adjust your medications, re-evaluate you in the office, send you to the emergency room, or see what other things we can do to help. The clinic staff is available to answer your questions during regular business hours (8:30am-5pm).  Please don't hesitate to call and ask to speak to one of our nurses for clinical concerns.    A surgeon from [facility] is always on call at the hospitals 24 hours/day If you have a medical emergency, go to the nearest emergency room or call 911.  FOLLOW UP in our office One the day of your discharge from the hospital (or the next business weekday), please call [facility] to set up or confirm an appointment to see your surgeon in the office for a follow-up appointment.  Usually it is 2-3 weeks after your surgery.   If you have skin staples at your incision(s), let the office know so we can set up a time in the office for the nurse to remove them  (usually around 10 days after surgery). Make sure that you call for appointments the day of discharge (or the next business weekday) from the hospital to ensure a convenient appointment time. IF YOU HAVE DISABILITY OR FAMILY LEAVE FORMS, BRING THEM TO THE OFFICE FOR PROCESSING.  DO NOT GIVE THEM TO YOUR DOCTOR.  [facility] [address], [place], [place]  [address] ? [phone] - Main [phone] - Toll Free,  [phone] - Fax [email or web address]    GETTING TO GOOD BOWEL HEALTH. It is expected for your digestive tract to need a few months to get back to normal.  It is common for your bowel movements and stools to  be irregular.  You will have occasional bloating and cramping that should eventually fade away.  Until you are eating solid food normally, off all pain medications, and back to regular activities; your bowels will not be normal.   Avoiding constipation The goal: ONE SOFT BOWEL MOVEMENT A DAY!    Drink plenty of fluids.  Choose water first. TAKE A FIBER SUPPLEMENT EVERY DAY THE REST OF YOUR LIFE During your first week back home, gradually add back a fiber supplement every day Experiment which form you can tolerate.   There are many forms such as powders, tablets, wafers, gummies, etc Psyllium bran (Metamucil), methylcellulose (Citrucel), Miralax or Glycolax, Benefiber, Flax Seed.  Adjust the dose week-by-week (1/2 dose/day to 6 doses a day) until you are moving your bowels 1-2 times a day.  Cut back the dose or try a different fiber product if it is giving you problems such as diarrhea or bloating. Sometimes a laxative is needed to help jump-start bowels if constipated until the fiber supplement can help regulate your bowels.  If you are tolerating eating & you are farting, it is okay to try a gentle laxative such as double dose MiraLax, prune juice, or Milk of Magnesia.  Avoid using laxatives too often. Stool softeners can  sometimes help counteract the constipating effects of narcotic pain medicines.  It can also cause diarrhea, so avoid using for too long. If you are still constipated despite taking fiber daily, eating solids, and a few doses of laxatives, call our office. Controlling diarrhea Try drinking liquids and eating bland foods for a few days to avoid stressing your intestines further. Avoid dairy products (especially milk & ice cream) for a short time.  The intestines often can lose the ability to digest lactose when stressed. Avoid foods that cause gassiness or bloating.  Typical foods include beans and other legumes, cabbage, broccoli, and dairy foods.  Avoid greasy, spicy, fast foods.  Every person has some sensitivity to other foods, so listen to your body and avoid those foods that trigger problems for you. Probiotics (such as active yogurt, Align, etc) may help repopulate the intestines and colon with normal bacteria and calm down a sensitive digestive tract Adding a fiber supplement gradually can help thicken stools by absorbing excess fluid and retrain the intestines to act more normally.  Slowly increase the dose over a few weeks.  Too much fiber too soon can backfire and cause cramping & bloating. It is okay to try and slow down diarrhea with a few doses of antidiarrheal medicines.   Bismuth subsalicylate (ex. Kayopectate, Pepto Bismol) for a few doses can help control diarrhea.  Avoid if pregnant.   Loperamide (Imodium) can slow down diarrhea.  Start with one tablet (2mg ) first.  Avoid if you are having fevers or severe pain.  ILEOSTOMY PATIENTS WILL HAVE CHRONIC DIARRHEA since their colon is not in use.    Drink plenty of liquids.  You will need to drink even more glasses of water/liquid a day to avoid getting dehydrated. Record output from your ileostomy.  Expect to empty the bag every 3-4 hours at first.  Most people with a permanent ileostomy empty their bag 4-6 times at the least.   Use  antidiarrheal medicine (especially Imodium) several times a day to avoid getting dehydrated.  Start with a dose at bedtime & breakfast.  Adjust up or down as needed.  Increase antidiarrheal medications as directed to avoid emptying the bag more than 8 times a day (  every 3 hours). Work with your wound ostomy nurse to learn care for your ostomy.  See ostomy care instructions. TROUBLESHOOTING IRREGULAR BOWELS 1) Start with a soft & bland diet. No spicy, greasy, or fried foods.  2) Avoid gluten/wheat or dairy products from diet to see if symptoms improve. 3) Miralax 17gm or flax seed mixed in 8oz. water or juice-daily. May use 2-4 times a day as needed. 4) Gas-X, Phazyme, etc. as needed for gas & bloating.  5) Prilosec (omeprazole) over-the-counter as needed 6)  Consider probiotics (Align, Activa, etc) to help calm the bowels down  Call your doctor if you are getting worse or not getting better.  Sometimes further testing (cultures, endoscopy, X-ray studies, CT scans, bloodwork, etc.) may be needed to help diagnose and treat the cause of the diarrhea. [facility] [address], [place], [place]  [address] [phone] - Main.    [phone]  - Toll Free.   [phone] - Fax [email or web address]

## 2022-08-17 NOTE — H&P (Signed)
08/17/2022   REFERRING PHYSICIAN: Self  Patient Care Team: Raliegh Ip, DO as PCP - General Michaell Cowing, Shawn Route, MD as Consulting Provider (General Surgery) Sabas Sous, MD (Hematology and Oncology) Lynann Bologna, MD (Gastroenterology)  PROVIDER: Jarrett Soho, MD  [facility] MRN: [id number] DOB: [birth year]  SUBJECTIVE  Chief Complaint: New Problem (Recurrent Diverticulits)   Stephanie Brewer is a 57 y.o. female who is seen today as an office consultation at the request of DrChales Abrahams for evaluation of recurrent diverticulitis  History of Present Illness:  Pleasant active woman. Has had 3 attacks of diverticulitis this past year. Last attack was severe enough that she had to be admitted on IV antibiotics for intramural abscess. Eventually improved on Augmentin. Never want to go through that again. Wish to discuss surgery.  She is here to given her husband. She is a breast cancer survivor. She did have issues with a lot of irritable bowel and severe diarrhea on chemotherapy the first year afterwards. She is now off all that. She moves her bowels usually every day or so. She had an abdominoplasty with umbilical hernia repair 20 years ago. Suture repair. No other abdominal surgeries. She claims she can walk several miles without any difficulty. Does not smoke. No diabetes. She did have a DVT and a lower extremity. Had a recurrence and an upper extremity with persistent DVT. On Xarelto. Followed by Dr. Pamelia Hoit from a breast cancer survival and DVT standpoint.  Medical History:  Past Medical History: Diagnosis Date DVT (deep venous thrombosis) (CMS/HHS-HCC) History of cancer  Patient Active Problem List Diagnosis Malignant neoplasm of upper-outer quadrant of right breast in female, estrogen receptor positive (CMS/HHS-HCC) Diverticulitis GERD (gastroesophageal reflux disease) Gluten intolerance History of recurrent deep vein thrombosis (DVT) Pre-diabetes Pure  hypercholesterolemia History of breast cancer Chronic anticoagulation  Past Surgical History: Procedure Laterality Date MASTECTOMY PARTIAL / LUMPECTOMY Right 01/14/2020 DEEP AXILLARY SENTINEL NODE BIOPSY / EXCISION Right 01/14/2020 breast lift 2022   Allergies Allergen Reactions Esomeprazole Anaphylaxis Swollen throat  Swollen throat Swollen throat Hyoscyamine Anaphylaxis Doxycycline Palpitations Racing heart.  Racing heart. Racing heart.  Current Outpatient Medications on File Prior to Visit Medication Sig Dispense Refill anastrozole (ARIMIDEX) 1 mg tablet Take by mouth diphenoxylate-atropine (LOMOTIL) 2.5-0.025 mg tablet TAKE TWO TABLETS FOUR TIMES DAILY AS NEEDED FOR DIARRHEA WEGOVY 0.25 mg/0.5 mL pen injector Inject subcutaneously XARELTO DVT-PE TREAT 30D START 15 mg (42)- 20 mg (9) DsPk TAKE AS DIRECTED ON PACKAGE  No current facility-administered medications on file prior to visit.  Family History Problem Relation Age of Onset Skin cancer Father High blood pressure (Hypertension) Father Coronary Artery Disease (Blocked arteries around heart) Brother   Social History  Tobacco Use Smoking Status Never Smokeless Tobacco Never   Social History  Socioeconomic History Marital status: Married Tobacco Use Smoking status: Never Smokeless tobacco: Never Vaping Use Vaping status: Never Used Substance and Sexual Activity Alcohol use: Yes Drug use: Not Currently  Social Determinants of Health  Financial Resource Strain: Low Risk (05/29/2022) Received from [facility], [place] Overall Financial Resource Strain (CARDIA) Difficulty of Paying Living Expenses: Not hard at all Food Insecurity: No Food Insecurity (05/23/2022) Received from [facility], [place] Hunger Vital Sign Worried About Running Out of Food in the Last Year: Never true Ran Out of Food in the Last Year: Never true Transportation Needs: No Transportation Needs (05/29/2022) Received  from [facility], [place] [facility] - Transportation Lack of Transportation (Medical): No Lack of Transportation (Non-Medical): No Physical  Activity: Insufficiently Active (09/14/2020) Received from [facility] Exercise Vital Sign Days of Exercise per Week: 3 days Minutes of Exercise per Session: 30 min Stress: No Stress Concern Present (09/14/2020) Received from [facility] - Occupational Stress Questionnaire Feeling of Stress : Only a little  ############################################################  Review of Systems: A complete review of systems (ROS) was obtained from the patient. We have reviewed this information and discussed as appropriate with the patient. See HPI as well for other pertinent ROS.  Constitutional: No fevers, chills, sweats. Weight stable Eyes: No vision changes, No discharge HENT: No sore throats, nasal drainage Lymph: No neck swelling, No bruising easily Pulmonary: No cough, productive sputum CV: No orthopnea, PND . No exertional chest/neck/shoulder/arm pain. Patient can walk 2-3 miles without difficulty.  GI: No personal nor family history of GI/colon cancer, inflammatory bowel disease, irritable bowel syndrome, allergy such as Celiac Sprue, dietary/dairy problems, colitis, ulcers nor gastritis. No recent sick contacts/gastroenteritis. No travel outside the country. No changes in diet.  Renal: No UTIs, No hematuria Genital: No drainage, bleeding, masses Musculoskeletal: No severe joint pain. Good ROM major joints Skin: No sores or lesions Heme/Lymph: No easy bleeding. No swollen lymph nodes Neuro: No active seizures. No facial droop Psych: No hallucinations. No agitation  OBJECTIVE  Vitals: 07/16/22 1059 PainSc: 0-No pain  There is no height or weight on file to calculate BMI.  PHYSICAL EXAM:  Constitutional: Not cachectic. Hygeine adequate. Vitals signs as above. Eyes: No glasses. Vision  adequate,Pupils reactive, normal extraocular movements. Sclera nonicteric Neuro: CN II-XII intact. No major focal sensory defects. No major motor deficits. Lymph: No head/neck/groin lymphadenopathy Psych: No severe agitation. No severe anxiety. Judgment & insight Adequate, Oriented x4, HENT: Normocephalic, Mucus membranes moist. No thrush. Hearing: adequate Neck: Supple, No tracheal deviation. No obvious thyromegaly Chest: No pain to chest wall compression. Good respiratory excursion. No audible wheezing CV: Pulses intact. regular. No major extremity edema Ext: No obvious deformity or contracture. Edema: Not present. No cyanosis Skin: No major subcutaneous nodules. Warm and dry Musculoskeletal: Severe joint rigidity not present. No obvious clubbing. No digital petechiae. Mobility: no assist device moving easily without restrictions  Abdomen: Flat Soft. Nondistended. Nontender. Hernia: Not present. Diastasis recti: Not present. No hepatomegaly. No splenomegaly.  Genital/Pelvic: Inguinal hernia: Not present. Inguinal lymph nodes: without lymphadenopathy nor hidradenitis.  Rectal: (Deferred)    ###################################################################  Labs, Imaging and Diagnostic Testing:  Located in 'Care Everywhere' section of Epic EMR chart  PRIOR [facility] CLINIC NOTES:  Located in 'Care Everywhere' section of Epic EMR chart  SURGERY NOTES:  Not applicable  PATHOLOGY:  Located in 'Care Everywhere' section of Epic EMR chart  Assessment and Plan: DIAGNOSES:  Diagnoses and all orders for this visit:  Diverticulitis  Chronic anticoagulation  History of breast cancer  History of recurrent deep vein thrombosis (DVT)  Other orders - polyethylene glycol (MIRALAX) powder; Take 233.75 g by mouth once for 1 dose Take according to your procedure prep instructions. - bisacodyL (DULCOLAX) 5 mg EC tablet; Take 4 tablets (20 mg total) by mouth once daily as needed for  Constipation for up to 1 dose - metroNIDAZOLE (FLAGYL) 500 MG tablet; Take 2 tablets (1,000 mg total) by mouth 3 (three) times daily for 3 doses SEE BOWEL PREP INSTRUCTIONS: Take 2 tablets at 2pm, 3pm, and 10pm the day prior to your colon operation. - neomycin 500 mg tablet; Take 2 tablets (1,000 mg total) by mouth 3 (three) times  daily for 3 doses SEE BOWEL PREP INSTRUCTIONS: Take 2 tablets at 2pm, 3pm, and 10pm the day prior to your colon operation.    ASSESSMENT/PLAN  Pleasant active woman who has had 3 flares of diverticulitis in last 6 months, the last one complicated with intramural abscess requiring a week of hospitalization.  She wishes to be aggressive and consider surgical resection. Given the repeated attacks in a rather short time span and 1 being rather complicated with prolonged hospital stay less than 2 months ago, that is reasonable. Reasonable to proceed with a right robotic segmental resection.   The anatomy & physiology of the digestive tract was discussed. The pathophysiology of the colon was discussed. Natural history risks without surgery was discussed. I feel the risks of no intervention will lead to serious problems that outweigh the operative risks; therefore, I recommended a partial colectomy to remove the pathology. Minimally invasive (Robotic/Laparoscopic) & open techniques were discussed.  Risks such as bleeding, infection, abscess, leak, reoperation, injury to other organs, need for repair of tissues / organs, possible ostomy, hernia, heart attack, stroke, death, and other risks were discussed. I noted a good likelihood this will help address the problem. Goals of post-operative recovery were discussed as well. Need for adequate nutrition, daily bowel regimen and healthy physical activity, to optimize recovery was noted as well. We will work to minimize complications. Educational materials were available as well. Questions were answered. The patient expresses understanding  & wishes to proceed with surgery.  She is due for a colonoscopy. She is planned to have that done with Dr. Chales Abrahams in the next week. Unless that shows any surprises, hopefully can focus just on her sigmoid region where she has had a recurrent diverticulitis.  Hold her Xarelto 2 days preoperatively.    Ardeth Sportsman, MD, FACS, MASCRS Esophageal, Gastrointestinal & Colorectal Surgery Robotic and Minimally Invasive Surgery  [facility] [address] [place], [place] [address] [phone] Fax [phone] Main  CONTACT INFORMATION:  Weekday (9AM-5PM): Call [facility] main office at [phone]  Weeknight (5PM-9AM) or Weekend/Holiday: Check [email or web address] (password " [email or web address]") for General Surgery [facility] coverage  (Please, do not use SecureChat as it is not reliable communication to reach operating surgeons for immediate patient care given surgeries/outpatient duties/clinic/cross-coverage/off post-call which would lead to a delay in care.  Epic staff messaging available for outptient concerns, but may not be answered for 48 hours or more).    08/17/2022

## 2022-08-17 NOTE — Op Note (Signed)
08/17/2022  1:26 PM  PATIENT:  Stephanie Brewer  57 y.o. female  Patient Care Team: Raliegh Ip, DO as PCP - General (Family Medicine) Pershing Proud, RN as Oncology Nurse Navigator Donnelly Angelica, RN as Oncology Nurse Navigator Jarold Motto, Marzella Schlein, RN (Inactive) as Registered Nurse Cynda Familia, RN (Inactive) as Registered Nurse Serena Croissant, MD as Medical Oncologist (Hematology and Oncology) Anselm Lis, RPH-CPP as Pharmacist (Hematology and Oncology) Karie Soda, MD as Consulting Physician (General Surgery) Lynann Bologna, MD as Consulting Physician (Gastroenterology)  PRE-OPERATIVE DIAGNOSIS: RECURRENT SIGMOID DIVERTICULITIS  POST-OPERATIVE DIAGNOSIS:  RECURRENT SIGMOID DIVERTICULITIS  PROCEDURE:   -ROBOTIC LOW ANTERIOR RECTOSIGMOID RESECTION -INTRAOPERATIVE ASSESSMENT OF TISSUE VASCULAR PERFUSION USING ICG (indocyanine green) IMMUNOFLUORESCENCE -TRANSVERSUS ABDOMINIS PLANE (TAP) BLOCK - BILATERAL -RIGID PROCTOSCOPY  SURGEON:  Ardeth Sportsman, MD  ASSISTANT:  Angelena Form, MD  An experienced assistant was required given the standard of surgical care given the complexity of the case.  This assistant was needed for exposure, dissection, suction, tissue approximation, retraction, perception, etc  ANESTHESIA:  General endotracheal intubation anesthesia (GETA) and Regional TRANSVERSUS ABDOMINIS PLANE (TAP) nerve block -BILATERAL for perioperative & postoperative pain control at the level of the transverse abdominis & preperitoneal spaces along the flank at the anterior axillary line, from subcostal ridge to iliac crest under laparoscopic guidance provided with liposomal bupivacaine (Experel) 20mL mixed with 30 mL of bupivicaine 0.25%  Estimated Blood Loss (EBL):   Total I/O In: 1100 [I.V.:1000; IV Piggyback:100] Out: 20 [Blood:20].   (See anesthesia record)  Delay start of Pharmacological VTE agent (>24hrs) due to concerns of significant anemia, surgical  blood loss, or risk of bleeding?:  no  DRAINS: (None)  SPECIMEN:  Rectosigmoid (open end proximal)  DISPOSITION OF SPECIMEN:  Pathology  COUNTS:  Sponge, needle, & instrument counts CORRECT  PLAN OF CARE: Admit to inpatient   PATIENT DISPOSITION:  PACU - hemodynamically stable.  INDICATION:    Pleasant woman with episodes of recurrent diverticulitis.  At least 3 episodes this year with a complicated tach with admission.  Which to break the cycle of attacks and proceed with more aggressive intervention.  I recommended segmental resection:  The anatomy & physiology of the digestive tract was discussed.  The pathophysiology was discussed.  Natural history risks without surgery was discussed.   I worked to give an overview of the disease and the frequent need to have multispecialty involvement.  I feel the risks of no intervention will lead to serious problems that outweigh the operative risks; therefore, I recommended a partial colectomy to remove the pathology.  Laparoscopic & open techniques were discussed.   Risks such as bleeding, infection, abscess, leak, reoperation, possible ostomy, hernia, heart attack, death, and other risks were discussed.  I noted a good likelihood this will help address the problem.   Goals of post-operative recovery were discussed as well.  We will work to minimize complications.  Educational materials on the pathology had been given in the office.  Questions were answered.    The patient expressed understanding & wished to proceed with surgery.  OR FINDINGS:   Patient had inflamed rectosigmoid colon with dense adhesions to the left adnexa and uterus.  Consistent with chronic diverticulitis.  No obvious metastatic disease on visceral parietal peritoneum or liver.  It is a 29mm EEA anastomosis ( distal descending colon  connected to proximal rectum.)  It rests 10-11 cm from the anal verge by rigid proctoscopy.  CASE DATA:  Type of  patient?: Elective [facility]  Private Case  Status of Case? Elective Scheduled  Infection Present At Time Of Surgery (PATOS)?  PHLEGMON  DESCRIPTION:   Informed consent was confirmed.  The patient underwent general anaesthesia without difficulty.  The patient was positioned appropriately.  VTE prevention in place.  The patient was clipped, prepped, & draped in a sterile fashion.  Surgical timeout confirmed our plan.  The patient was positioned in reverse Trendelenburg.  Abdominal entry was gained using Varess technique at the left subcostal ridge on the anterior abdominal wall.  No elevated EtCO2 noted.  Port placed.  Camera inspection revealed no injury.  Extra ports were carefully placed under direct laparoscopic visualization.  Upon entering the abdomen (organ space),we encountered a phlegmon involving the sigmoid colon .   I reflected the greater omentum and the upper abdomen the small bowel in the upper abdomen.  The patient was carefully positioned.  The Intuitive daVinci robot was docked with camera & instruments carefully placed.  Worked to elevate the rectosigmoid colon & elevated it to put the main pedicle on tension.  I scored the base of peritoneum of the medial side of the mesentery of the elevated left colon from the ligament of Treitz to the mid rectum.   I elevated the sigmoid mesentery and entered into the retro-mesenteric plane. We were able to identify the left ureter and gonadal vessels. We kept those posterior within the retroperitoneum and elevated the left colon mesentery off that. I did isolate the inferior mesenteric artery (IMA) pedicle but did not ligate it yet.  I continued distally and got into the avascular plane posterior to the mesorectum, sparing the nervi ergentes.. This allowed me to help mobilize the rectum as well by freeing the mesorectum off the sacrum.  I stayed away from the right and left ureters.  I kept the lateral vascular pedicles to the rectum intact.  I skeletonized the lymph nodes  off the inferior mesenteric artery pedicle.  I went down to its takeoff from the aorta.   I isolated the inferior mesenteric vein off of the ligament of Treitz just cephalad to that as well.  After confirming the left ureter was out of the way, I went ahead and ligated the inferior mesenteric artery pedicle just near its takeoff from the aorta.  I did ligate the inferior mesenteric vein in a similar fashion.  We ensured hemostasis.  I continued medial to lateral dissection to free the left colon mesentery off the retroperitoneum going up towards the splenic flexure inferior pancreatic ridge to allow good mobility and protect the colon mesentery.  I mobilized the left colon in a lateral to medial fashion off the retroperitoneum and sidewall attachments along the line of Toldt up towards the splenic flexure to ensure good mobilization of the remaining left colon to reach into the pelvis.  Patient had a very thinned out left colon mesentery but a pulsatile marginal artery.  We then focused on mesorectal dissection.  Freed the sigmoid mesentery off the retroperitoneum including the left ureter and gonadal's.  The most dense adhesions were to the left adnexa which were carefully isolated after having mobilized posteriorly superiorly and laterally.  Eventually carefully freed that off the left retroperitoneum and pelvic sidewall, especially the fallopian tube as well as the uterus left corner.  With that I had better mobility to place the rectosigmoid on tension.  We freed the mesorectum off the presacral plane until I was distal to the concerning region.  Freed off  peritoneum on the lateral sidewalls as well and transected the mesentery of the lateral pedicles to get distal to the area of concern.  Came around anteriorly such that I had good circumferential mesorectal excision and a good margin distal to the area of concern.  I chose a region at the descending/sigmoid junction that was soft and easily reached down to  the rectal stump.  So the inflammation and dense adhesions of the pelvis I had to go a little more distally to the proximal/mid rectal junction.  I skeletonized transected through the mesorectum.  We then chose a region for the proximal margin that would reach well for our planned anastomosis (distal descending colon).  Transected the colon mesentery radially to preserve good collateral and marginal artery blood supply.    To access vascular perfusion of tissues, we asked anesthesia use intravenous  indocyanine green (ICG) with IV flush.  I switched to the NIR fluorescence (Firefly mode) imaging window on the daVinci robot platform.  We were able to see good light green visualization of blood vessels with good vascular perfusion of tissues, confirming good tissue perfusion of tissues planned for anastomosis.  Then transected at the distal margin with a robotic stapler.  We created an extraction incision through a small Pfannenstiel incision in the suprapubic region at the site of her prior abdominoplasty.  Placed a wound protector.  I was able to eviscerate the rectosigmoid and descending colon out the wound.   I clamped the colon proximal to this area using a reusable pursestringer device.  Passed a 2-0 Keith needle. I transected at the descending/sigmoid junction with a scalpel. I got healthy bleeding mucosa.  We sent the rectosigmoid colon specimen off to go to pathology.  We sized the colon orifice.  I chose a 29mm EEA anvil stapler system.  I reinforced the prolene pursestring with interrupted silk "belt loop" sutures.  I placed the anvil to the open end of the proximal remaining colon and closed around it using the pursestring.    We did copious irrigation with crystalloid solution.  Hemostasis was good.  The distal end of the remaining colon easily reached down to the rectal stump, therefore, splenic flexure mobilization was  not needed.      I scrubbed down and did gentle anal dilation and advanced the  EEA stapler up the rectal stump. The spike was brought out at the provimal end of the rectal stump under direct visualization.  Dr Cliffton Asters  attached the anvil of the proximal colon the spike of the stapler. Anvil was tightened down and held clamped for 60 seconds.  Orientation was confirmed such that there is no twisting of the colon nor small bowel underneath the mesenteric defect. No concerning tension.  The EEA stapler was fired and held clamped for 30 seconds. The stapler was released & removed. Blue stitch is in the proximal ring.  Care was taken to ensure no other structures were incorporated within this either.  We noted 2 excellent anastomotic rings.   The colon proximal to the anastomosis was then gently occluded. The pelvis was filled with sterile irrigation.  I  did rigid proctoscopy noted the anastomosis was at 10-11 cm from the anal verge consistent with the proximal rectum.  There was a negative air leak test. There was no tension of mesentery or bowel at the anastomosis.   Tissues looked viable.  Ureters & bowel uninjured.  The anastomosis looked healthy.   Endoluminal gas was evacuated.  Ports & wound  protector removed.  We changed gloves & redraped the patient per colon SSI prevention protocol.  We aspirated the sterile irrigation.  Hemostasis was good.  Sterile unused instruments were used from this point.  I closed the skin at the port sites using Monocryl stitch and sterile dressing.  We assured hemostasis and the former ostomy wound.  Wound irrigated.  I closed the posterior rectus fascia with 0 Vicryl suture.  Anterior rectus fascia was closed using #1 PDS transversely. Sterile dressing placed.   Patient is being extubated go to recovery room. I had discussed postop care with the patient in detail the office & in the holding area. Instructions are written. I discussed operative findings, updated the patient's status, discussed probable steps to recovery, and gave postoperative  recommendations to the patient's spouse, Isola Keltz .  Recommendations were made.  Questions were answered.  He expressed understanding & appreciation.  Ardeth Sportsman, M.D., F.A.C.S. Gastrointestinal and Minimally Invasive Surgery [facility] [address], [address] [place], [place] [address] [phone] Main / Paging

## 2022-08-17 NOTE — Anesthesia Preprocedure Evaluation (Signed)
Anesthesia Evaluation  Patient identified by MRN, date of birth, ID band Patient awake    Reviewed: Allergy & Precautions, NPO status , Patient's Chart, lab work & pertinent test results  Airway Mallampati: II  TM Distance: >3 FB Neck ROM: Full    Dental   Pulmonary neg pulmonary ROS   breath sounds clear to auscultation       Cardiovascular negative cardio ROS  Rhythm:Regular Rate:Normal     Neuro/Psych negative neurological ROS     GI/Hepatic Neg liver ROS,GERD  ,,  Endo/Other    Renal/GU negative Renal ROS     Musculoskeletal   Abdominal   Peds  Hematology negative hematology ROS (+) Hx DVT on xarelto last dose 5/14   Anesthesia Other Findings   Reproductive/Obstetrics                              Lab Results  Component Value Date   WBC 4.2 08/07/2022   HGB 12.7 08/07/2022   HCT 39.2 08/07/2022   MCV 91.0 08/07/2022   PLT 268 08/07/2022   Lab Results  Component Value Date   CREATININE 0.64 08/07/2022   BUN 12 08/07/2022   NA 137 08/07/2022   K 3.9 08/07/2022   CL 105 08/07/2022   CO2 27 08/07/2022    Anesthesia Physical Anesthesia Plan  ASA: 2  Anesthesia Plan: General   Post-op Pain Management: Celebrex PO (pre-op)*, Ketamine IV*, Tylenol PO (pre-op)* and Gabapentin PO (pre-op)*   Induction: Intravenous  PONV Risk Score and Plan: 3 and Midazolam, Dexamethasone, Ondansetron and Treatment may vary due to age or medical condition  Airway Management Planned: Oral ETT  Additional Equipment: None  Intra-op Plan:   Post-operative Plan: Extubation in OR  Informed Consent: I have reviewed the patients History and Physical, chart, labs and discussed the procedure including the risks, benefits and alternatives for the proposed anesthesia with the patient or authorized representative who has indicated his/her understanding and acceptance.     Dental advisory  given  Plan Discussed with: CRNA  Anesthesia Plan Comments:          Anesthesia Quick Evaluation

## 2022-08-17 NOTE — Interval H&P Note (Signed)
History and Physical Interval Note:  08/17/2022 10:49 AM  Stephanie Brewer  has presented today for surgery, with the diagnosis of DIVERTICULITIS.  The various methods of treatment have been discussed with the patient and family. After consideration of risks, benefits and other options for treatment, the patient has consented to  Procedure(s) with comments: ROBOTIC RESECTION OF SIGMOID COLON (N/A) - GEN w/ERAS PATHWAY RIGID PROCTOSCOPY (N/A) as a surgical intervention.  The patient's history has been reviewed, patient examined, no change in status, stable for surgery.  I have reviewed the patient's chart and labs.  Questions were answered to the patient's satisfaction.    I have re-reviewed the the patient's records, history, medications, and allergies.  I have re-examined the patient.  I again discussed intraoperative plans and goals of post-operative recovery.  The patient agrees to proceed.  Stephanie Brewer  11-Aug-1965 [id number]  Patient Care Team: Raliegh Ip, DO as PCP - General (Family Medicine) Pershing Proud, RN as Oncology Nurse Navigator Donnelly Angelica, RN as Oncology Nurse Navigator Cynda Familia, RN (Inactive) as Registered Nurse Cynda Familia, RN (Inactive) as Registered Nurse Serena Croissant, MD as Medical Oncologist (Hematology and Oncology) Anselm Lis, RPH-CPP as Pharmacist (Hematology and Oncology) Karie Soda, MD as Consulting Physician (General Surgery) Lynann Bologna, MD as Consulting Physician (Gastroenterology)  Patient Active Problem List   Diagnosis Date Noted   Chronic anticoagulation 07/16/2022   History of breast cancer 07/16/2022   Hypokalemia 05/27/2022   Normocytic anemia 05/26/2022   History of recurrent deep vein thrombosis (DVT) 05/26/2022   Pre-diabetes 05/23/2022   Pure hypercholesterolemia 05/23/2022   Diverticulitis 05/23/2022   Osteoporosis of femur without pathological fracture 11/15/2021   Overweight (BMI 25.0-29.9)  08/15/2021   Port-A-Cath in place 09/09/2019   Malignant neoplasm of upper-outer quadrant of right breast in female, estrogen receptor positive (HCC) 08/20/2019   Breast cancer metastasized to axillary lymph node, right (HCC) 08/18/2019   Trigger middle finger of left hand 02/10/2019   Radicular leg pain 02/04/2018   H/O bilateral breast implants 12/24/2017   GERD (gastroesophageal reflux disease) 12/06/2015   Gluten intolerance 12/06/2015    Past Medical History:  Diagnosis Date   Anemia    Anxiety    Breast cancer (HCC)    Clotting disorder (HCC)    Displacement of breast implant 12/24/2017   Diverticulitis    Diverticulosis    DVT (deep venous thrombosis) (HCC)    left leg knee and ankle    GERD (gastroesophageal reflux disease)    History of kidney stones    Kidney stones    Pneumonia    Pre-diabetes     Past Surgical History:  Procedure Laterality Date   BREAST BIOPSY Left 08/15/2021   BREAST ENHANCEMENT SURGERY     Augmentation 2003   BREAST IMPLANT REMOVAL Bilateral 01/14/2020   Procedure: REMOVAL BREAST IMPLANTS;  Surgeon: Allena Napoleon, MD;  Location: [facility];  Service: Plastics;  Laterality: Bilateral;   BREAST LUMPECTOMY Right 01/2020   BREAST LUMPECTOMY WITH RADIOACTIVE SEED AND SENTINEL LYMPH NODE BIOPSY Right 01/14/2020   Procedure: Right breast seed localized lumpectomy with sentinel lymph node biopsy;  Surgeon: Emelia Loron, MD;  Location: [facility];  Service: General;  Laterality: Right;   BUNIONECTOMY Bilateral 2005   Right foot in 2006   LIPOSUCTION WITH LIPOFILLING Bilateral 11/29/2020   Procedure: LIPOSUCTION WITH LIPOFILLING FROM ABDOMEN TO BILATERAL BREASTS;  Surgeon: Allena Napoleon, MD;  Location: [facility];  Service: Plastics;  Laterality: Bilateral;   MASTOPEXY Left 11/29/2020   Procedure: LEFT MASTOPEXY;  Surgeon: Allena Napoleon, MD;  Location: [facility];  Service:  Plastics;  Laterality: Left;   PORTACATH PLACEMENT N/A 09/01/2019   Procedure: INSERTION PORT-A-CATH WITH ULTRASOUND GUIDANCE;  Surgeon: Emelia Loron, MD;  Location: [facility];  Service: General;  Laterality: N/A;   TRIGGER FINGER RELEASE Left 05/09/2020   Procedure: RELEASE TRIGGER FINGER/A-1 PULLEY LEFT LONG;  Surgeon: Betha Loa, MD;  Location: [facility];  Service: Orthopedics;  Laterality: Left;   UMBILICAL HERNIA REPAIR  2002    Social History   Socioeconomic History   Marital status: Married    Spouse name: Tim   Number of children: 2   Years of education: some college   Highest education level: Not on file  Occupational History   Occupation: [employer] banker, corporate  Tobacco Use   Smoking status: Never   Smokeless tobacco: Never  Vaping Use   Vaping Use: Never used  Substance and Sexual Activity   Alcohol use: Yes    Comment: 1 PER MONTH   Drug use: No   Sexual activity: Yes    Birth control/protection: I.U.D.  Other Topics Concern   Not on file  Social History Narrative   Lives with spouse   Caffeine use: no caffeine    Left handed    Works for an Chartered certified accountant to read fiction   Social Determinants of Health   Financial Resource Strain: Low Risk  (05/29/2022)   Overall Financial Resource Strain (CARDIA)    Difficulty of Paying Living Expenses: Not hard at all  Food Insecurity: No Food Insecurity (05/23/2022)   Hunger Vital Sign    Worried About Running Out of Food in the Last Year: Never true    Ran Out of Food in the Last Year: Never true  Transportation Needs: No Transportation Needs (07/17/2022)   PRAPARE - Administrator, Civil Service (Medical): No    Lack of Transportation (Non-Medical): No  Physical Activity: Insufficiently Active (07/17/2022)   Exercise Vital Sign    Days of Exercise per Week: 3 days    Minutes of Exercise per Session: 30 min  Stress: No Stress Concern Present (07/17/2022)   [employer] of Occupational Health - Occupational Stress Questionnaire    Feeling of Stress : Only a little  Social Connections: Socially Integrated (07/17/2022)   Social Connection and Isolation Panel [NHANES]    Frequency of Communication with Friends and Family: More than three times a week    Frequency of Social Gatherings with Friends and Family: More than three times a week    Attends Religious Services: More than 4 times per year    Active Member of [employer] or Organizations: Yes    Attends Engineer, structural: More than 4 times per year    Marital Status: Married  Catering manager Violence: Not At Risk (07/17/2022)   Humiliation, Afraid, Rape, and Kick questionnaire    Fear of Current or Ex-Partner: No    Emotionally Abused: No    Physically Abused: No    Sexually Abused: No    Family History  Problem Relation Age of Onset   Hyperlipidemia Mother    Polymyalgia rheumatica Mother    Hypertension Father    Hyperlipidemia Father    Diabetes type I Brother    Ovarian cancer Maternal Aunt  Diabetes type II Maternal Grandfather    Allergies Daughter    Colon cancer Neg Hx    Esophageal cancer Neg Hx    Rectal cancer Neg Hx    Stomach cancer Neg Hx     Medications Prior to Admission  Medication Sig Dispense Refill Last Dose   anastrozole (ARIMIDEX) 1 MG tablet TAKE ONE TABLET ONCE DAILY (Patient taking differently: Take 1 mg by mouth at bedtime.) 90 tablet 3 Past Week at 2100   glucose blood test strip UAD to check sugar. R73.03 100 each 12 Past Week   Lancets Thin MISC UAD to check sugar. R73.03 100 each 12 Past Week   Semaglutide-Weight Management (WEGOVY) 2.4 MG/0.75ML SOAJ Inject 2.4 mg into the skin every 7 (seven) days. (Patient taking differently: Inject 2.4 mg into the skin See admin instructions. Inject 2.4 mg into the skin every 7-28 days) 9 mL 3 07/15/2022   TYLENOL 325 MG tablet Take 325 mg by mouth every 6 (six) hours as needed for headache or mild pain.    Past Week   XARELTO 20 MG TABS tablet TAKE ONE TABLET ONCE DAILY WITH SUPPER (Patient taking differently: Take 20 mg by mouth in the morning.) 30 tablet 12 08/14/2022 at 0800   ondansetron (ZOFRAN) 4 MG tablet Take 1 tablet (4 mg total) by mouth every 6 (six) hours as needed for nausea or vomiting. (Patient not taking: Reported on 06/01/2022) 20 tablet 0 Not Taking    Current Facility-Administered Medications  Medication Dose Route Frequency Provider Last Rate Last Admin   bisacodyl (DULCOLAX) EC tablet 20 mg  20 mg Oral Once Karie Soda, MD       bupivacaine liposome (EXPAREL) 1.3 % injection 266 mg  20 mL Infiltration Once Karie Soda, MD       cefoTEtan (CEFOTAN) 2 g in sodium chloride 0.9 % 100 mL IVPB  2 g Intravenous On Call to OR Karie Soda, MD       feeding supplement (ENSURE PRE-SURGERY) liquid 296 mL  296 mL Oral Once Karie Soda, MD       feeding supplement (ENSURE PRE-SURGERY) liquid 592 mL  592 mL Oral Once Karie Soda, MD       lactated ringers infusion   Intravenous Continuous Shelton Silvas, MD 10 mL/hr at 08/17/22 0923 New Bag at 08/17/22 0923   neomycin (MYCIFRADIN) tablet 1,000 mg  1,000 mg Oral 3 times per day Karie Soda, MD       And   metroNIDAZOLE (FLAGYL) tablet 1,000 mg  1,000 mg Oral 3 times per day Karie Soda, MD       polyethylene glycol powder (GLYCOLAX/MIRALAX) container 255 g  1 Container Oral Once Karie Soda, MD         Allergies  Allergen Reactions   Hyoscyamine Anaphylaxis   Fosamax [Alendronate] Nausea Only and Other (See Comments)    SEVERE NAUSEA   Nexium [Esomeprazole] Swelling and Other (See Comments)    Tongue became swollen- breathing not affected   Doxycycline Palpitations and Other (See Comments)    Racing heart    BP 135/80   Pulse 76   Temp 97.9 F (36.6 C) (Oral)   Resp 16   Ht 5\' 4"  (1.626 m)   Wt 56.2 kg   SpO2 100%   BMI 21.28 kg/m   Labs: Results for orders placed or performed during the hospital encounter  of 08/17/22 (from the past 48 hour(s))  Glucose, capillary     Status: Abnormal  Collection Time: 08/17/22  9:07 AM  Result Value Ref Range   Glucose-Capillary 104 (H) 70 - 99 mg/dL    Comment: Glucose reference range applies only to samples taken after fasting for at least 8 hours.    Imaging / Studies: MM DIAG BREAST TOMO BILATERAL  Result Date: 08/16/2022 CLINICAL DATA:  Post lumpectomy for right breast carcinoma, performed in October 2021. Benign left breast biopsy revealing fat necrosis, 08/15/2021. Routine annual mammographic surveillance. EXAM: DIGITAL DIAGNOSTIC BILATERAL MAMMOGRAM WITH TOMOSYNTHESIS TECHNIQUE: Bilateral digital diagnostic mammography and breast tomosynthesis was performed. COMPARISON:  Previous exam(s). ACR Breast Density Category b: There are scattered areas of fibroglandular density. FINDINGS: Post lumpectomy changes on the right are stable. There are no breast masses, areas of nonsurgical architectural distortion, areas of new or significant asymmetry or suspicious calcifications. IMPRESSION: 1. No evidence of new or recurrent breast carcinoma. 2. Benign post lumpectomy changes on right. RECOMMENDATION: Screening mammogram in one year.(Code:SM-B-01Y) I have discussed the findings and recommendations with the patient. If applicable, a reminder letter will be sent to the patient regarding the next appointment. BI-RADS CATEGORY  2: Benign. Electronically Signed   By: Amie Portland M.D.   On: 08/16/2022 08:18     .Ardeth Sportsman, M.D., F.A.C.S. Gastrointestinal and Minimally Invasive Surgery [facility] [address], [address] [place], [place] [address] [phone] Main / Paging  08/17/2022 10:50 AM    Ardeth Sportsman

## 2022-08-17 NOTE — Anesthesia Postprocedure Evaluation (Signed)
Anesthesia Post Note  Patient: Stephanie Brewer  Procedure(s) Performed: ROBOTIC LOW ANTERIOR RESECTION, BILATERAL TAP BLOCK, AND INTRAOPERATIVE ASSESSMENT OF PERFUSION USING FIREFLY DYE RIGID PROCTOSCOPY     Patient location during evaluation: PACU Anesthesia Type: General Level of consciousness: awake and alert Pain management: pain level controlled Vital Signs Assessment: post-procedure vital signs reviewed and stable Respiratory status: spontaneous breathing, nonlabored ventilation, respiratory function stable and patient connected to nasal cannula oxygen Cardiovascular status: blood pressure returned to baseline and stable Postop Assessment: no apparent nausea or vomiting Anesthetic complications: no  No notable events documented.  Last Vitals:  Vitals:   08/17/22 1445 08/17/22 1503  BP: 117/79 131/80  Pulse: 71 84  Resp: 14   Temp:  (!) 36.4 C  SpO2: 95% 97%    Last Pain:  Vitals:   08/17/22 1430  TempSrc:   PainSc: 0-No pain                 Kennieth Rad

## 2022-08-17 NOTE — Anesthesia Procedure Notes (Signed)
Procedure Name: Intubation Date/Time: 08/17/2022 11:09 AM  Performed by: Pearson Grippe, CRNAPre-anesthesia Checklist: Patient identified, Emergency Drugs available, Suction available and Patient being monitored Patient Re-evaluated:Patient Re-evaluated prior to induction Oxygen Delivery Method: Circle system utilized Preoxygenation: Pre-oxygenation with 100% oxygen Induction Type: IV induction Ventilation: Mask ventilation without difficulty Laryngoscope Size: Miller and 2 Grade View: Grade I Tube type: Oral Tube size: 7.0 mm Number of attempts: 1 Airway Equipment and Method: Stylet Placement Confirmation: ETT inserted through vocal cords under direct vision, positive ETCO2 and breath sounds checked- equal and bilateral Secured at: 20 cm Tube secured with: Tape Dental Injury: Teeth and Oropharynx as per pre-operative assessment

## 2022-08-18 ENCOUNTER — Encounter (HOSPITAL_COMMUNITY): Payer: Self-pay | Admitting: Surgery

## 2022-08-18 LAB — BASIC METABOLIC PANEL
Anion gap: 8 (ref 5–15)
BUN: 9 mg/dL (ref 6–20)
CO2: 23 mmol/L (ref 22–32)
Calcium: 8.3 mg/dL — ABNORMAL LOW (ref 8.9–10.3)
Chloride: 102 mmol/L (ref 98–111)
Creatinine, Ser: 0.7 mg/dL (ref 0.44–1.00)
GFR, Estimated: >60 mL/min (ref >60)
Glucose, Bld: 154 mg/dL — ABNORMAL HIGH (ref 70–99)
Potassium: 4.1 mmol/L (ref 3.5–5.1)
Sodium: 133 mmol/L — ABNORMAL LOW (ref 135–145)

## 2022-08-18 LAB — MAGNESIUM: Magnesium: 1.7 mg/dL (ref 1.7–2.4)

## 2022-08-18 LAB — CBC
HCT: 30.2 % — ABNORMAL LOW (ref 36.0–46.0)
Hemoglobin: 10.3 g/dL — ABNORMAL LOW (ref 12.0–15.0)
MCH: 30.3 pg (ref 26.0–34.0)
MCHC: 34.1 g/dL (ref 30.0–36.0)
MCV: 88.8 fL (ref 80.0–100.0)
Platelets: 202 10*3/uL (ref 150–400)
RBC: 3.4 MIL/uL — ABNORMAL LOW (ref 3.87–5.11)
RDW: 13.3 % (ref 11.5–15.5)
WBC: 7.4 10*3/uL (ref 4.0–10.5)
nRBC: 0 % (ref 0.0–0.2)

## 2022-08-18 NOTE — Progress Notes (Signed)
Patient ambulated in hallway about 60 ft. Tolerated well. Mild dizziness and nausea but that subsided once she returned to room.

## 2022-08-18 NOTE — Progress Notes (Signed)
1 Day Post-Op   Subjective/Chief Complaint: Dizzy while walking. Tolerated clears. Pain reasonable.  Passing some gas, no BM yet.  No n/v.   Objective: Vital signs in last 24 hours: Temp:  [96.8 F (36 C)-98 F (36.7 C)] 97.6 F (36.4 C) (05/18 0408) Pulse Rate:  [61-85] 75 (05/18 0408) Resp:  [12-17] 15 (05/18 0408) BP: (96-140)/(45-94) 104/65 (05/18 0427) SpO2:  [93 %-100 %] 97 % (05/18 0408) Weight:  [56.2 kg-58.5 kg] 58.5 kg (05/18 0500) Last BM Date : 08/17/22  Intake/Output from previous day: 05/17 0701 - 05/18 0700 In: 2988.8 [P.O.:240; I.V.:2148.8; IV Piggyback:600] Out: 1620 [Urine:1600; Blood:20] Intake/Output this shift: No intake/output data recorded.  General appearance: alert, cooperative, and no distress Resp: breathing comfortably GI: soft, mildly distended, approp tender,some serosang staining on pfannenstiel dressing.     Lab Results:  Recent Labs    08/18/22 0417  WBC 7.4  HGB 10.3*  HCT 30.2*  PLT 202   BMET Recent Labs    08/18/22 0417  NA 133*  K 4.1  CL 102  CO2 23  GLUCOSE 154*  BUN 9  CREATININE 0.70  CALCIUM 8.3*   PT/INR No results for input(s): "LABPROT", "INR" in the last 72 hours. ABG No results for input(s): "PHART", "HCO3" in the last 72 hours.  Invalid input(s): "PCO2", "PO2"  Studies/Results: No results found.  Anti-infectives: Anti-infectives (From admission, onward)    Start     Dose/Rate Route Frequency Ordered Stop   08/17/22 2200  cefoTEtan (CEFOTAN) 2 g in sodium chloride 0.9 % 100 mL IVPB        2 g 200 mL/hr over 30 Minutes Intravenous Every 12 hours 08/17/22 1506 08/17/22 2144   08/17/22 1400  neomycin (MYCIFRADIN) tablet 1,000 mg  Status:  Discontinued       See Hyperspace for full Linked Orders Report.   1,000 mg Oral 3 times per day 08/17/22 0857 08/17/22 1501   08/17/22 1400  metroNIDAZOLE (FLAGYL) tablet 1,000 mg  Status:  Discontinued       See Hyperspace for full Linked Orders Report.   1,000  mg Oral 3 times per day 08/17/22 0857 08/17/22 1501   08/17/22 0900  cefoTEtan (CEFOTAN) 2 g in sodium chloride 0.9 % 100 mL IVPB        2 g 200 mL/hr over 30 Minutes Intravenous On call to O.R. 08/17/22 0857 08/17/22 1155       Assessment/Plan: s/p Procedure(s) with comments: ROBOTIC LOW ANTERIOR RESECTION, BILATERAL TAP BLOCK, AND INTRAOPERATIVE ASSESSMENT OF PERFUSION USING FIREFLY DYE (N/A) - GEN w/ERAS PATHWAY RIGID PROCTOSCOPY (N/A)- 5/17 - Dr. Michaell Cowing  Ambulate Advance diet Pain control  D/c foley; see if can spontaneously void.     LOS: 1 day    Almond Lint 08/18/2022

## 2022-08-18 NOTE — Progress Notes (Signed)
  Transition of Care [facility]) Screening Note   Patient Details  Name: Stephanie Brewer Date of Birth: June 04, 1965   Transition of Care [facility]) CM/SW Contact:    Adrian Prows, RN Phone Number: 08/18/2022, 12:46 PM    Transition of Care Department [facility]) has reviewed patient and no TOC needs have been identified at this time. We will continue to monitor patient advancement through interdisciplinary progression rounds. If new patient transition needs arise, please place a TOC consult.

## 2022-08-19 LAB — HEMOGLOBIN: Hemoglobin: 9.4 g/dL — ABNORMAL LOW (ref 12.0–15.0)

## 2022-08-19 NOTE — Progress Notes (Signed)
2 Days Post-Op   Subjective/Chief Complaint: Pt doing well this AM Tol PO but havign some nausea Min BMs   Objective: Vital signs in last 24 hours: Temp:  [97.9 F (36.6 C)-98.9 F (37.2 C)] 97.9 F (36.6 C) (05/19 0537) Pulse Rate:  [78-99] 78 (05/19 0537) Resp:  [18] 18 (05/19 0537) BP: (101-107)/(58-64) 101/64 (05/19 0537) SpO2:  [95 %-96 %] 95 % (05/19 0537) Weight:  [58.3 kg] 58.3 kg (05/19 0500) Last BM Date : 08/17/22  Intake/Output from previous day: 05/18 0701 - 05/19 0700 In: 540 [P.O.:540] Out: -  Intake/Output this shift: No intake/output data recorded.  General appearance: alert and cooperative GI: soft, non-tender; bowel sounds normal; no masses,  no organomegaly and inc c/d/i  Lab Results:  Recent Labs    08/18/22 0417 08/19/22 0426  WBC 7.4  --   HGB 10.3* 9.4*  HCT 30.2*  --   PLT 202  --    BMET Recent Labs    08/18/22 0417  NA 133*  K 4.1  CL 102  CO2 23  GLUCOSE 154*  BUN 9  CREATININE 0.70  CALCIUM 8.3*   PT/INR No results for input(s): "LABPROT", "INR" in the last 72 hours. ABG No results for input(s): "PHART", "HCO3" in the last 72 hours.  Invalid input(s): "PCO2", "PO2"  Studies/Results: No results found.  Anti-infectives: Anti-infectives (From admission, onward)    Start     Dose/Rate Route Frequency Ordered Stop   08/17/22 2200  cefoTEtan (CEFOTAN) 2 g in sodium chloride 0.9 % 100 mL IVPB        2 g 200 mL/hr over 30 Minutes Intravenous Every 12 hours 08/17/22 1506 08/17/22 2144   08/17/22 1400  neomycin (MYCIFRADIN) tablet 1,000 mg  Status:  Discontinued       See Hyperspace for full Linked Orders Report.   1,000 mg Oral 3 times per day 08/17/22 0857 08/17/22 1501   08/17/22 1400  metroNIDAZOLE (FLAGYL) tablet 1,000 mg  Status:  Discontinued       See Hyperspace for full Linked Orders Report.   1,000 mg Oral 3 times per day 08/17/22 0857 08/17/22 1501   08/17/22 0900  cefoTEtan (CEFOTAN) 2 g in sodium chloride 0.9  % 100 mL IVPB        2 g 200 mL/hr over 30 Minutes Intravenous On call to O.R. 08/17/22 0857 08/17/22 1155       Assessment/Plan: s/p Procedure(s) with comments: ROBOTIC LOW ANTERIOR RESECTION, BILATERAL TAP BLOCK, AND INTRAOPERATIVE ASSESSMENT OF PERFUSION USING FIREFLY DYE (N/A) - GEN w/ERAS PATHWAY RIGID PROCTOSCOPY (N/A) -tol some PO but having some nausea -small BMs yesterday -Mobilize -AROBF  LOS: 2 days    Axel Filler 08/19/2022

## 2022-08-20 ENCOUNTER — Other Ambulatory Visit (HOSPITAL_BASED_OUTPATIENT_CLINIC_OR_DEPARTMENT_OTHER): Payer: Self-pay

## 2022-08-20 DIAGNOSIS — R110 Nausea: Secondary | ICD-10-CM | POA: Insufficient documentation

## 2022-08-20 LAB — SURGICAL PATHOLOGY

## 2022-08-20 LAB — HEMOGLOBIN: Hemoglobin: 9.4 g/dL — ABNORMAL LOW (ref 12.0–15.0)

## 2022-08-20 MED ORDER — ONDANSETRON HCL 4 MG PO TABS: 4.0000 mg | ORAL_TABLET | Freq: Four times a day (QID) | ORAL | 2 refills | Status: DC | PRN

## 2022-08-20 MED ORDER — RIVAROXABAN 10 MG PO TABS
10.0000 mg | ORAL_TABLET | Freq: Every day | ORAL | Status: DC
  Administered 2022-08-20: 10 mg via ORAL
  Filled 2022-08-20: qty 1

## 2022-08-20 NOTE — Discharge Summary (Signed)
Physician Discharge Summary    Patient ID: Stephanie Brewer MRN: [id number] DOB/AGE: 05-06-1965  57 y.o.  Patient Care Team: Raliegh Ip, DO as PCP - General (Family Medicine) Pershing Proud, RN as Oncology Nurse Navigator Donnelly Angelica, RN as Oncology Nurse Navigator Jarold Motto, Marzella Schlein, RN (Inactive) as Registered Nurse Cynda Familia, RN (Inactive) as Registered Nurse Serena Croissant, MD as Medical Oncologist (Hematology and Oncology) Anselm Lis, RPH-CPP as Pharmacist (Hematology and Oncology) Karie Soda, MD as Consulting Physician (General Surgery) Lynann Bologna, MD as Consulting Physician (Gastroenterology)  Admit date: 08/17/2022  Discharge date: 08/20/2022  Hospital Stay = 3 days    Discharge Diagnoses:  Principal Problem:   Sigmoid diverticulitis Active Problems:   GERD (gastroesophageal reflux disease)   Pre-diabetes   Normocytic anemia   History of recurrent deep vein thrombosis (DVT)   Chronic anticoagulation   Chronic nausea   3 Days Post-Op  08/17/2022  POST-OPERATIVE DIAGNOSIS:  RECURRENT SIGMOID DIVERTICULITIS   PROCEDURE:   -ROBOTIC LOW ANTERIOR RECTOSIGMOID RESECTION -INTRAOPERATIVE ASSESSMENT OF TISSUE VASCULAR PERFUSION USING ICG (indocyanine green) IMMUNOFLUORESCENCE -TRANSVERSUS ABDOMINIS PLANE (TAP) BLOCK - BILATERAL -RIGID PROCTOSCOPY   SURGEON:  Ardeth Sportsman, MD  OR FINDINGS:    Patient had inflamed rectosigmoid colon with dense adhesions to the left adnexa and uterus.  Consistent with chronic diverticulitis.   No obvious metastatic disease on visceral parietal peritoneum or liver.   It is a 29mm EEA anastomosis ( distal descending colon  connected to proximal rectum.)  It rests 10-11 cm from the anal verge by rigid proctoscopy.   CASE DATA:  Type of patient?: Elective [facility] Private Case Status of Case? Elective Scheduled Infection Present At Time Of Surgery (PATOS)?  PHLEGMON   Consults: Case Management /  Social Work and Anesthesia  Hospital Course:   The patient underwent the surgery above.  Postoperatively, the patient gradually mobilized and advanced to a solid diet.  Pain, nausea and other symptoms were treated aggressively.    By the time of discharge, the patient was walking well the hallways, eating food, having flatus.  Pain was well-controlled on an oral medications.  Based on meeting discharge criteria and continuing to recover, I felt it was safe for the patient to be discharged from the hospital to further recover with close followup. Postoperative recommendations were discussed in detail.  They are written as well.  Discharged Condition: good  Discharge Exam: Blood pressure 105/68, pulse 71, temperature 98.1 F (36.7 C), resp. rate 17, height 5\' 4"  (1.626 m), weight 55.7 kg, SpO2 97 %.  General: Pt awake/alert/oriented x4 in No acute distress Eyes: PERRL, normal EOM.  Sclera clear.  No icterus Neuro: CN II-XII intact w/o focal sensory/motor deficits. Lymph: No head/neck/groin lymphadenopathy Psych:  No delerium/psychosis/paranoia HENT: Normocephalic, Mucus membranes moist.  No thrush Neck: Supple, No tracheal deviation Chest:  No chest wall pain w good excursion CV:  Pulses intact.  Regular rhythm MS: Normal AROM mjr joints.  No obvious deformity Abdomen: Soft.  Nondistended.  Nontender.  No evidence of peritonitis.  No incarcerated hernias. Ext:  SCDs BLE.  No mjr edema.  No cyanosis Skin: No petechiae / purpura   Disposition:    Follow-up Information     Karie Soda, MD Follow up on 09/11/2022.   Specialties: General Surgery, Colon and Rectal Surgery Contact information: [address] [place] [address] [phone]  Discharge disposition: 01-Home or Self Care       Discharge Instructions     Call MD for:   Complete by: As directed    FEVER > 101.5 F  (temperatures < 101.5 F are not significant)   Call MD for:   extreme fatigue   Complete by: As directed    Call MD for:  persistant dizziness or light-headedness   Complete by: As directed    Call MD for:  persistant nausea and vomiting   Complete by: As directed    Call MD for:  redness, tenderness, or signs of infection (pain, swelling, redness, odor or green/yellow discharge around incision site)   Complete by: As directed    Call MD for:  severe uncontrolled pain   Complete by: As directed    Diet - low sodium heart healthy   Complete by: As directed    Start with a bland diet such as soups, liquids, starchy foods, low fat foods, etc. the first few days at home. Gradually advance to a solid, low-fat, high fiber diet by the end of the first week at home.   Add a fiber supplement to your diet (Metamucil, etc) If you feel full, bloated, or constipated, stay on a full liquid or pureed/blenderized diet for a few days until you feel better and are no longer constipated.   Discharge instructions   Complete by: As directed    See Discharge Instructions If you are not getting better after two weeks or are noticing you are getting worse, contact our office [phone] for further advice.  We may need to adjust your medications, re-evaluate you in the office, send you to the emergency room, or see what other things we can do to help. The clinic staff is available to answer your questions during regular business hours (8:30am-5pm).  Please don't hesitate to call and ask to speak to one of our nurses for clinical concerns.    A surgeon from [facility] is always on call at the hospitals 24 hours/day If you have a medical emergency, go to the nearest emergency room or call 911.   Discharge wound care:   Complete by: As directed    It is good for closed incisions and even open wounds to be washed every day.  Shower every day.  Short baths are fine.  Wash the incisions and wounds clean with soap & water.    You may leave closed incisions open to  air if it is dry.   You may cover the incision with clean gauze & replace it after your daily shower for comfort.  TEGADERM:  You have clear gauze band-aid dressings over your closed incision(s).  Remove the dressings 3 days after surgery.   Driving Restrictions   Complete by: As directed    You may drive when: - you are no longer taking narcotic prescription pain medication - you can comfortably wear a seatbelt - you can safely make sudden turns/stops without pain.   Increase activity slowly   Complete by: As directed    Start light daily activities --- self-care, walking, climbing stairs- beginning the day after surgery.  Gradually increase activities as tolerated.  Control your pain to be active.  Stop when you are tired.  Ideally, walk several times a day, eventually an hour a day.   Most people are back to most day-to-day activities in a few weeks.  It takes 4-6 weeks to get back to unrestricted, intense activity. If  you can walk 30 minutes without difficulty, it is safe to try more intense activity such as jogging, treadmill, bicycling, low-impact aerobics, swimming, etc. Save the most intensive and strenuous activity for last (Usually 4-8 weeks after surgery) such as sit-ups, heavy lifting, contact sports, etc.  Refrain from any intense heavy lifting or straining until you are off narcotics for pain control.  You will have off days, but things should improve week-by-week. DO NOT PUSH THROUGH PAIN.  Let pain be your guide: If it hurts to do something, don't do it.   Lifting restrictions   Complete by: As directed    If you can walk 30 minutes without difficulty, it is safe to try more intense activity such as jogging, treadmill, bicycling, low-impact aerobics, swimming, etc. Save the most intensive and strenuous activity for last (Usually 4-8 weeks after surgery) such as sit-ups, heavy lifting, contact sports, etc.   Refrain from any intense heavy lifting or straining until you are off  narcotics for pain control.  You will have off days, but things should improve week-by-week. DO NOT PUSH THROUGH PAIN.  Let pain be your guide: If it hurts to do something, don't do it.  Pain is your body warning you to avoid that activity for another week until the pain goes down.   May shower / Bathe   Complete by: As directed    May walk up steps   Complete by: As directed    Remove dressing in 72 hours   Complete by: As directed    Make sure all dressings are removed by the third day after surgery.  Leave incisions open to air.  OK to cover incisions with gauze or bandages as desired.   Sexual Activity Restrictions   Complete by: As directed    You may have sexual intercourse when it is comfortable. If it hurts to do something, stop.       Allergies as of 08/20/2022       Reactions   Hyoscyamine Anaphylaxis   Fosamax [alendronate] Nausea Only, Other (See Comments)   SEVERE NAUSEA   Nexium [esomeprazole] Swelling, Other (See Comments)   Tongue became swollen- breathing not affected   Doxycycline Palpitations, Other (See Comments)   Racing heart        Medication List     TAKE these medications    anastrozole 1 MG tablet Commonly known as: ARIMIDEX TAKE ONE TABLET ONCE DAILY What changed: when to take this   glucose blood test strip UAD to check sugar. R73.03   Lancets Thin Misc UAD to check sugar. R73.03   ondansetron 4 MG tablet Commonly known as: ZOFRAN Take 1 tablet (4 mg total) by mouth every 6 (six) hours as needed for nausea or vomiting.   traMADol 50 MG tablet Commonly known as: ULTRAM Take 1-2 tablets (50-100 mg total) by mouth every 6 (six) hours as needed for moderate pain or severe pain.   Tylenol 325 MG tablet Generic drug: acetaminophen Take 325 mg by mouth every 6 (six) hours as needed for headache or mild pain.   Wegovy 2.4 MG/0.75ML Soaj Generic drug: Semaglutide-Weight Management Inject 2.4 mg into the skin every 7 (seven) days. What  changed:  when to take this additional instructions   Xarelto 20 MG Tabs tablet Generic drug: rivaroxaban TAKE ONE TABLET ONCE DAILY WITH SUPPER What changed: See the new instructions.               Discharge Care Instructions  (From admission,  onward)           Start     Ordered   08/17/22 0000  Discharge wound care:       Comments: It is good for closed incisions and even open wounds to be washed every day.  Shower every day.  Short baths are fine.  Wash the incisions and wounds clean with soap & water.    You may leave closed incisions open to air if it is dry.   You may cover the incision with clean gauze & replace it after your daily shower for comfort.  TEGADERM:  You have clear gauze band-aid dressings over your closed incision(s).  Remove the dressings 3 days after surgery.   08/17/22 1318            Significant Diagnostic Studies:  Results for orders placed or performed during the hospital encounter of 08/17/22 (from the past 72 hour(s))  Glucose, capillary     Status: Abnormal   Collection Time: 08/17/22  9:07 AM  Result Value Ref Range   Glucose-Capillary 104 (H) 70 - 99 mg/dL    Comment: Glucose reference range applies only to samples taken after fasting for at least 8 hours.  Type and screen [facility]     Status: None   Collection Time: 08/17/22 11:31 AM  Result Value Ref Range   ABO/RH(D) O NEG    Antibody Screen NEG    Sample Expiration      08/20/2022,2359 Performed at [facility], [address], [place], [place] [address]   Basic metabolic panel     Status: Abnormal   Collection Time: 08/18/22  4:17 AM  Result Value Ref Range   Sodium 133 (L) 135 - 145 mmol/L   Potassium 4.1 3.5 - 5.1 mmol/L   Chloride 102 98 - 111 mmol/L   CO2 23 22 - 32 mmol/L   Glucose, Bld 154 (H) 70 - 99 mg/dL    Comment: Glucose reference range applies only to samples taken after fasting for at least 8 hours.   BUN 9 6 -  20 mg/dL   Creatinine, Ser 6.04 0.44 - 1.00 mg/dL   Calcium 8.3 (L) 8.9 - 10.3 mg/dL   GFR, Estimated >54 >09 mL/min    Comment: (NOTE) Calculated using the CKD-EPI Creatinine Equation (2021)    Anion gap 8 5 - 15    Comment: Performed at [facility], [address], [place], [place] [address]  CBC     Status: Abnormal   Collection Time: 08/18/22  4:17 AM  Result Value Ref Range   WBC 7.4 4.0 - 10.5 K/uL   RBC 3.40 (L) 3.87 - 5.11 MIL/uL   Hemoglobin 10.3 (L) 12.0 - 15.0 g/dL   HCT 47.8 (L) 29.5 - 62.1 %   MCV 88.8 80.0 - 100.0 fL   MCH 30.3 26.0 - 34.0 pg   MCHC 34.1 30.0 - 36.0 g/dL   RDW 30.8 65.7 - 84.6 %   Platelets 202 150 - 400 K/uL   nRBC 0.0 0.0 - 0.2 %    Comment: Performed at [facility], [address] [place], [place] [address]  Magnesium     Status: None   Collection Time: 08/18/22  4:17 AM  Result Value Ref Range   Magnesium 1.7 1.7 - 2.4 mg/dL    Comment: Performed at [facility], [address] [place], [place] [address]  Hemoglobin  Status: Abnormal   Collection Time: 08/19/22  4:26 AM  Result Value Ref Range   Hemoglobin 9.4 (L) 12.0 - 15.0 g/dL    Comment: Performed at [facility], [address], [place], [place] [address]  Hemoglobin     Status: Abnormal   Collection Time: 08/20/22  4:31 AM  Result Value Ref Range   Hemoglobin 9.4 (L) 12.0 - 15.0 g/dL    Comment: Performed at [facility], [address], [place], [place] [address]    No results found.  Past Medical History:  Diagnosis Date   Anemia    Anxiety    Breast cancer (HCC)    Clotting disorder (HCC)    Displacement of breast implant 12/24/2017   Diverticulitis    Diverticulosis    DVT (deep venous thrombosis) (HCC)    left leg knee and ankle    GERD (gastroesophageal reflux disease)    History of kidney stones    Kidney stones    Pneumonia    Pre-diabetes     Past Surgical  History:  Procedure Laterality Date   BREAST BIOPSY Left 08/15/2021   BREAST ENHANCEMENT SURGERY     Augmentation 2003   BREAST IMPLANT REMOVAL Bilateral 01/14/2020   Procedure: REMOVAL BREAST IMPLANTS;  Surgeon: Allena Napoleon, MD;  Location: [facility];  Service: Plastics;  Laterality: Bilateral;   BREAST LUMPECTOMY Right 01/2020   BREAST LUMPECTOMY WITH RADIOACTIVE SEED AND SENTINEL LYMPH NODE BIOPSY Right 01/14/2020   Procedure: Right breast seed localized lumpectomy with sentinel lymph node biopsy;  Surgeon: Emelia Loron, MD;  Location: [facility];  Service: General;  Laterality: Right;   BUNIONECTOMY Bilateral 2005   Right foot in 2006   LIPOSUCTION WITH LIPOFILLING Bilateral 11/29/2020   Procedure: LIPOSUCTION WITH LIPOFILLING FROM ABDOMEN TO BILATERAL BREASTS;  Surgeon: Allena Napoleon, MD;  Location: [facility];  Service: Plastics;  Laterality: Bilateral;   MASTOPEXY Left 11/29/2020   Procedure: LEFT MASTOPEXY;  Surgeon: Allena Napoleon, MD;  Location: [facility];  Service: Plastics;  Laterality: Left;   PORTACATH PLACEMENT N/A 09/01/2019   Procedure: INSERTION PORT-A-CATH WITH ULTRASOUND GUIDANCE;  Surgeon: Emelia Loron, MD;  Location: [facility];  Service: General;  Laterality: N/A;   PROCTOSCOPY N/A 08/17/2022   Procedure: RIGID PROCTOSCOPY;  Surgeon: Karie Soda, MD;  Location: [facility];  Service: General;  Laterality: N/A;   TRIGGER FINGER RELEASE Left 05/09/2020   Procedure: RELEASE TRIGGER FINGER/A-1 PULLEY LEFT LONG;  Surgeon: Betha Loa, MD;  Location:  SURGERY CENTER;  Service: Orthopedics;  Laterality: Left;   UMBILICAL HERNIA REPAIR  2002    Social History   Socioeconomic History   Marital status: Married    Spouse name: Tim   Number of children: 2   Years of education: some college   Highest education level: Not on file  Occupational History   Occupation: [employer] civil engineer  Tobacco Use   Smoking status: Never   Smokeless tobacco: Never  Vaping Use   Vaping Use: Never used  Substance and Sexual Activity   Alcohol use: Yes    Comment: 1 PER MONTH   Drug use: No   Sexual activity: Yes    Birth control/protection: I.U.D.  Other Topics Concern   Not on file  Social History Narrative   Lives with spouse   Caffeine use: no caffeine    Left handed    Works for an  insurance agency   Loves to read fiction   Social Determinants of Health   Financial Resource Strain: Low Risk  (05/29/2022)   Overall Financial Resource Strain (CARDIA)    Difficulty of Paying Living Expenses: Not hard at all  Food Insecurity: No Food Insecurity (08/17/2022)   Hunger Vital Sign    Worried About Running Out of Food in the Last Year: Never true    Ran Out of Food in the Last Year: Never true  Transportation Needs: No Transportation Needs (08/17/2022)   PRAPARE - Administrator, Civil Service (Medical): No    Lack of Transportation (Non-Medical): No  Physical Activity: Insufficiently Active (07/17/2022)   Exercise Vital Sign    Days of Exercise per Week: 3 days    Minutes of Exercise per Session: 30 min  Stress: No Stress Concern Present (07/17/2022)   [employer] of Occupational Health - Occupational Stress Questionnaire    Feeling of Stress : Only a little  Social Connections: Socially Integrated (07/17/2022)   Social Connection and Isolation Panel [NHANES]    Frequency of Communication with Friends and Family: More than three times a week    Frequency of Social Gatherings with Friends and Family: More than three times a week    Attends Religious Services: More than 4 times per year    Active Member of [employer] or Organizations: Yes    Attends Engineer, structural: More than 4 times per year    Marital Status: Married  Catering manager Violence: Not At Risk (08/17/2022)   Humiliation, Afraid, Rape, and Kick questionnaire    Fear of Current or  Ex-Partner: No    Emotionally Abused: No    Physically Abused: No    Sexually Abused: No    Family History  Problem Relation Age of Onset   Hyperlipidemia Mother    Polymyalgia rheumatica Mother    Hypertension Father    Hyperlipidemia Father    Diabetes type I Brother    Ovarian cancer Maternal Aunt    Diabetes type II Maternal Grandfather    Allergies Daughter    Colon cancer Neg Hx    Esophageal cancer Neg Hx    Rectal cancer Neg Hx    Stomach cancer Neg Hx     Current Facility-Administered Medications  Medication Dose Route Frequency Provider Last Rate Last Admin   0.9 %  sodium chloride infusion  250 mL Intravenous PRN Karie Soda, MD       acetaminophen (TYLENOL) tablet 1,000 mg  1,000 mg Oral Trecia Rogers, MD   1,000 mg at 08/20/22 0558   alum & mag hydroxide-simeth (MAALOX/MYLANTA) 200-200-20 MG/5ML suspension 30 mL  30 mL Oral Q6H PRN Karie Soda, MD       alvimopan (ENTEREG) capsule 12 mg  12 mg Oral BID Karie Soda, MD   12 mg at 08/19/22 2103   diphenhydrAMINE (BENADRYL) 12.5 MG/5ML elixir 12.5 mg  12.5 mg Oral Q6H PRN Karie Soda, MD       Or   diphenhydrAMINE (BENADRYL) injection 12.5 mg  12.5 mg Intravenous Q6H PRN Karie Soda, MD       enalaprilat (VASOTEC) injection 0.625-1.25 mg  0.625-1.25 mg Intravenous Q6H PRN Karie Soda, MD       feeding supplement (ENSURE SURGERY) liquid 237 mL  237 mL Oral BID BM Karie Soda, MD   237 mL at 08/19/22 0903   hydrALAZINE (APRESOLINE) injection 10 mg  10 mg Intravenous Q2H PRN Karie Soda, MD  HYDROmorphone (DILAUDID) injection 0.5-2 mg  0.5-2 mg Intravenous Q4H PRN Karie Soda, MD       lip balm (CARMEX) ointment   Topical BID Karie Soda, MD   Given at 08/19/22 2103   magic mouthwash  15 mL Oral QID PRN Karie Soda, MD       melatonin tablet 3 mg  3 mg Oral QHS PRN Karie Soda, MD       menthol-cetylpyridinium (CEPACOL) lozenge 3 mg  1 lozenge Oral PRN Karie Soda, MD        methocarbamol (ROBAXIN) 1,000 mg in dextrose 5 % 100 mL IVPB  1,000 mg Intravenous Q6H PRN Karie Soda, MD       methocarbamol (ROBAXIN) tablet 1,000 mg  1,000 mg Oral Q6H PRN Karie Soda, MD   1,000 mg at 08/19/22 1610   metoprolol tartrate (LOPRESSOR) injection 5 mg  5 mg Intravenous Q6H PRN Karie Soda, MD       ondansetron [facility] tablet 4 mg  4 mg Oral Q6H PRN Karie Soda, MD       Or   ondansetron [facility]) injection 4 mg  4 mg Intravenous Q6H PRN Karie Soda, MD   4 mg at 08/19/22 0905   phenol (CHLORASEPTIC) mouth spray 2 spray  2 spray Mouth/Throat PRN Karie Soda, MD       prochlorperazine (COMPAZINE) tablet 10 mg  10 mg Oral Q6H PRN Karie Soda, MD       Or   prochlorperazine (COMPAZINE) injection 5-10 mg  5-10 mg Intravenous Q6H PRN Karie Soda, MD       rivaroxaban Carlena Hurl) tablet 10 mg  10 mg Oral Daily Karie Soda, MD       simethicone [facility]) chewable tablet 40 mg  40 mg Oral Q6H PRN Karie Soda, MD   40 mg at 08/18/22 9604   sodium chloride flush (NS) 0.9 % injection 3 mL  3 mL Intravenous Catha Gosselin, MD   3 mL at 08/19/22 2134   sodium chloride flush (NS) 0.9 % injection 3 mL  3 mL Intravenous PRN Karie Soda, MD       traMADol Janean Sark) tablet 50-100 mg  50-100 mg Oral Q6H PRN Karie Soda, MD   100 mg at 08/19/22 1721     Allergies  Allergen Reactions   Hyoscyamine Anaphylaxis   Fosamax [Alendronate] Nausea Only and Other (See Comments)    SEVERE NAUSEA   Nexium [Esomeprazole] Swelling and Other (See Comments)    Tongue became swollen- breathing not affected   Doxycycline Palpitations and Other (See Comments)    Racing heart    Signed:   Ardeth Sportsman, MD, FACS, MASCRS Esophageal, Gastrointestinal & Colorectal Surgery Robotic and Minimally Invasive Surgery  [facility] [address] [place], [place] [address] [phone] Fax [phone] [address]  CONTACT  INFORMATION:  Weekday (9AM-5PM): Call [facility] main office at [phone]  Weeknight (5PM-9AM) or Weekend/Holiday: Check [email or web address] (password " [email or web address]") for General Surgery [facility] coverage  (Please, do not use SecureChat as it is not reliable communication to reach operating surgeons for immediate patient care given surgeries/outpatient duties/clinic/cross-coverage/off post-call which would lead to a delay in care.  Epic staff messaging available for outptient concerns, but may not be answered for 48 hours or more).     08/20/2022, 7:27 AM

## 2022-08-20 NOTE — Progress Notes (Signed)
Patient was provided with discharge education, IV's removed. Gauze removed from incision sites.

## 2022-08-21 ENCOUNTER — Telehealth: Payer: Self-pay

## 2022-08-21 NOTE — Progress Notes (Signed)
Patient Care Team: Raliegh Ip, DO as PCP - General (Family Medicine) Pershing Proud, RN as Oncology Nurse Navigator Rogelia Boga, Eileen Stanford, RN as Oncology Nurse Navigator Cynda Familia, RN (Inactive) as Registered Nurse Cynda Familia, RN (Inactive) as Registered Nurse Serena Croissant, MD as Medical Oncologist (Hematology and Oncology) Anselm Lis, RPH-CPP as Pharmacist (Hematology and Oncology) Karie Soda, MD as Consulting Physician (General Surgery) Lynann Bologna, MD as Consulting Physician (Gastroenterology)  DIAGNOSIS: No diagnosis found.  SUMMARY OF ONCOLOGIC HISTORY: Oncology History  Malignant neoplasm of upper-outer quadrant of right breast in female, estrogen receptor positive (HCC)  07/28/2019 Initial Diagnosis   Screening mammogram detected right breast mass 11:30 position subareolar 1.3 cm, indeterminate 5 mm mass: Biopsy fibroadenoma, right axillary lymph node present, biopsy of the lymph node and the mass revealed grade 2 IDC ER 10%, PR 0%, Ki-67 45%, HER-2 +3+ by [facility]   08/20/2019 Cancer Staging   Staging form: Breast, AJCC 8th Edition - Clinical stage from 08/20/2019: Stage IIA (cT2, cN1, cM0, G2, ER+, PR-, HER2+) - Signed by Lonie Peak, MD on 01/26/2020   09/02/2019 - 12/18/2019 Chemotherapy   The patient had dexamethasone (DECADRON) 4 MG tablet, 4 mg (100 % of original dose 4 mg), Oral, Daily, 1 of 1 cycle, Start date: 08/20/2019, End date: 01/06/2020 Dose modification: 4 mg (original dose 4 mg, Cycle 0) palonosetron (ALOXI) injection 0.25 mg, 0.25 mg, Intravenous,  Once, 6 of 6 cycles Administration: 0.25 mg (09/02/2019), 0.25 mg (09/23/2019), 0.25 mg (11/25/2019), 0.25 mg (12/16/2019), 0.25 mg (10/14/2019), 0.25 mg (11/04/2019) pegfilgrastim-jmdb (FULPHILA) injection 6 mg, 6 mg, Subcutaneous,  Once, 6 of 6 cycles Administration: 6 mg (09/04/2019), 6 mg (09/25/2019), 6 mg (11/27/2019), 6 mg (12/18/2019), 6 mg (10/16/2019), 6 mg (11/06/2019) CARBOplatin (PARAPLATIN)  700 mg in sodium chloride 0.9 % 250 mL chemo infusion, 700 mg (100 % of original dose 700 mg), Intravenous,  Once, 6 of 6 cycles Dose modification: 700 mg (original dose 700 mg, Cycle 1), 700 mg (original dose 700 mg, Cycle 5), 600 mg (original dose 700 mg, Cycle 5, Reason: Other (see comments), Comment: dose reduce to 600 mg for CINV per Dr. Pamelia Hoit) Administration: 700 mg (09/02/2019), 700 mg (09/23/2019), 600 mg (11/25/2019), 600 mg (12/16/2019), 700 mg (10/14/2019), 600 mg (11/04/2019) DOCEtaxel (TAXOTERE) 150 mg in sodium chloride 0.9 % 250 mL chemo infusion, 75 mg/m2 = 150 mg, Intravenous,  Once, 6 of 6 cycles Dose modification: 50 mg/m2 (original dose 75 mg/m2, Cycle 5, Reason: Dose not tolerated), 50 mg/m2 (original dose 75 mg/m2, Cycle 4, Reason: Dose not tolerated) Administration: 150 mg (09/02/2019), 150 mg (09/23/2019), 100 mg (11/25/2019), 100 mg (12/16/2019), 150 mg (10/14/2019), 100 mg (11/04/2019) fosaprepitant (EMEND) 150 mg in sodium chloride 0.9 % 145 mL IVPB, 150 mg, Intravenous,  Once, 6 of 6 cycles Administration: 150 mg (09/02/2019), 150 mg (09/23/2019), 150 mg (11/25/2019), 150 mg (12/16/2019), 150 mg (10/14/2019), 150 mg (11/04/2019) pertuzumab (PERJETA) 420 mg in sodium chloride 0.9 % 250 mL chemo infusion, 420 mg (100 % of original dose 420 mg), Intravenous, Once, 5 of 5 cycles Dose modification: 420 mg (original dose 420 mg, Cycle 1, Reason: Provider Judgment) Administration: 420 mg (09/02/2019), 420 mg (09/23/2019), 420 mg (11/25/2019), 420 mg (10/14/2019), 420 mg (11/04/2019) trastuzumab-dkst (OGIVRI) 672 mg in sodium chloride 0.9 % 250 mL chemo infusion, 8 mg/kg = 672 mg, Intravenous,  Once, 6 of 6 cycles Administration: 672 mg (09/02/2019), 504 mg (09/23/2019), 504 mg (11/25/2019), 504 mg (12/16/2019), 504  mg (10/14/2019), 504 mg (11/04/2019)  for chemotherapy treatment.    01/06/2020 - 08/24/2020 Chemotherapy         01/14/2020 Surgery   Right lumpectomy Dwain Sarna): no evidence of residual carcinoma, 4  right axillary lymph nodes negative for carcinoma.   02/16/2020 - 03/30/2020 Radiation Therapy   Adjuvant radiation     CHIEF COMPLIANT: Follow-up of right breast cancer on neratinib   INTERVAL HISTORY: Stephanie Brewer is a 57 y.o. with above-mentioned history of right breast cancer who completed neoadjuvant chemotherapy, underwent a right lumpectomy, radiation, and is currently on Herceptin maintenance. She presents to the clinic today for follow-up.     ALLERGIES:  is allergic to hyoscyamine, fosamax [alendronate], nexium [esomeprazole], and doxycycline.  MEDICATIONS:  Current Outpatient Medications  Medication Sig Dispense Refill   anastrozole (ARIMIDEX) 1 MG tablet TAKE ONE TABLET ONCE DAILY (Patient taking differently: Take 1 mg by mouth at bedtime.) 90 tablet 3   glucose blood test strip UAD to check sugar. R73.03 100 each 12   Lancets Thin MISC UAD to check sugar. R73.03 100 each 12   ondansetron (ZOFRAN) 4 MG tablet Take 1 tablet (4 mg total) by mouth every 6 (six) hours as needed for nausea or vomiting. 8 tablet 2   Semaglutide-Weight Management (WEGOVY) 2.4 MG/0.75ML SOAJ Inject 2.4 mg into the skin every 7 (seven) days. (Patient taking differently: Inject 2.4 mg into the skin See admin instructions. Inject 2.4 mg into the skin every 7-28 days) 9 mL 3   traMADol (ULTRAM) 50 MG tablet Take 1-2 tablets (50-100 mg total) by mouth every 6 (six) hours as needed for moderate pain or severe pain. 20 tablet 0   TYLENOL 325 MG tablet Take 325 mg by mouth every 6 (six) hours as needed for headache or mild pain.     XARELTO 20 MG TABS tablet TAKE ONE TABLET ONCE DAILY WITH SUPPER (Patient taking differently: Take 20 mg by mouth in the morning.) 30 tablet 12   No current facility-administered medications for this visit.    PHYSICAL EXAMINATION: ECOG PERFORMANCE STATUS: {CHL ONC ECOG PS:[phone]}  There were no vitals filed for this visit. There were no vitals filed for this  visit.  BREAST:*** No palpable masses or nodules in either right or left breasts. No palpable axillary supraclavicular or infraclavicular adenopathy no breast tenderness or nipple discharge. (exam performed in the presence of a chaperone)  LABORATORY DATA:  I have reviewed the data as listed    Latest Ref Rng & Units 08/18/2022    4:17 AM 08/07/2022   11:08 AM 06/01/2022    3:32 PM  CMP  Glucose 70 - 99 mg/dL 161  98  88   BUN 6 - 20 mg/dL 9  12  16    Creatinine 0.44 - 1.00 mg/dL 0.96  0.45  4.09   Sodium 135 - 145 mmol/L 133  137  140   Potassium 3.5 - 5.1 mmol/L 4.1  3.9  3.8   Chloride 98 - 111 mmol/L 102  105  99   CO2 22 - 32 mmol/L 23  27  32   Calcium 8.9 - 10.3 mg/dL 8.3  9.0  9.7   Total Protein 6.0 - 8.3 g/dL   7.3   Total Bilirubin 0.2 - 1.2 mg/dL   0.2   Alkaline Phos 39 - 117 U/L   63   AST 0 - 37 U/L   10   ALT 0 - 35 U/L   7  Lab Results  Component Value Date   WBC 7.4 08/18/2022   HGB 9.4 (L) 08/20/2022   HCT 30.2 (L) 08/18/2022   MCV 88.8 08/18/2022   PLT 202 08/18/2022   NEUTROABS 2.6 06/01/2022    ASSESSMENT & PLAN:  No problem-specific Assessment & Plan notes found for this encounter.    No orders of the defined types were placed in this encounter.  The patient has a good understanding of the overall plan. she agrees with it. she will call with any problems that may develop before the next visit here. Total time spent: 30 mins including face to face time and time spent for planning, charting and co-ordination of care   Sherlyn Lick, CMA 08/21/22    I Janan Ridge am acting as a Neurosurgeon for [employer]  ***

## 2022-08-21 NOTE — Transitions of Care (Post Inpatient/ED Visit) (Signed)
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 How have you been since you were released from the hospital?: Better (Still sore but am getting around, out for a walk) Any questions or concerns?: No  Items Reviewed: Did you receive and understand the discharge instructions provided?: Yes Medications obtained,verified, and reconciled?: Yes (Medications Reviewed) Any new allergies since your discharge?: No Dietary orders reviewed?: Yes Type of Diet Ordered:: Low sodium, heart healthy Do you have support at home?: Yes People in Home: spouse Name of Support/Comfort Primary Source: Tim  Medications Reviewed Today: Medications Reviewed Today     Reviewed by Jodelle Gross, RN (Case Manager) on 08/21/22 at 1351  Med List Status: <None>   Medication Order Taking? Sig Documenting Provider Last Dose Status Informant  anastrozole (ARIMIDEX) 1 MG tablet [id number] Yes TAKE ONE TABLET ONCE DAILY  Patient taking differently: Take 1 mg by mouth at bedtime.   Serena Croissant, MD Taking Active Self, Pharmacy Records  glucose blood test strip [id number] Yes UAD to check sugar. R73.03 Raliegh Ip, DO Taking Active Self, Pharmacy Records  Lancets Thin MISC [id number]  UAD to check sugar. R73.03 Raliegh Ip, DO  Active Self, Pharmacy Records  ondansetron [facility]) 4 MG tablet [id number] No Take 1 tablet (4 mg total) by mouth every 6 (six) hours as needed for nausea or vomiting. Karie Soda, MD Unknown Active   Semaglutide-Weight Management [facility]) 2.4 MG/0.75ML Ivory Broad [id number] Yes Inject 2.4 mg into the skin every 7  (seven) days.  Patient taking differently: Inject 2.4 mg into the skin See admin instructions. Inject 2.4 mg into the skin every 7-28 days   Raliegh Ip, DO Taking Active Self, Pharmacy Records           Med Note Sheridan, Melody Comas May 23, 2022  6:44 PM) The patient said she does not always use this weekly  traMADol (ULTRAM) 50 MG tablet [id number] Yes Take 1-2 tablets (50-100 mg total) by mouth every 6 (six) hours as needed for moderate pain or severe pain. Karie Soda, MD Taking Active   TYLENOL 325 MG tablet [id number]  Take 325 mg by mouth every 6 (six) hours as needed for headache or mild pain. [provider]  Active Self, Pharmacy Records  XARELTO 20 MG TABS tablet [id number] Yes TAKE ONE TABLET ONCE DAILY WITH SUPPER  Patient taking differently: Take 20 mg by mouth in the morning.   Serena Croissant, MD Taking Active Self, Pharmacy Records  Med List Note Anselm Lis, RPH-CPP 12/05/21 0932): Neratinib filled at [facility]            Home Care and Equipment/Supplies: Were Home Health Services Ordered?: No Any new equipment or medical supplies ordered?: No  Functional Questionnaire: Do you need assistance with bathing/showering or dressing?: No Do you need assistance with meal preparation?: No Do you need assistance with eating?: No Do you have difficulty maintaining continence: No Do you need assistance with getting out of bed/getting out of a chair/moving?: No Do you have difficulty managing or taking your medications?: No  Follow up appointments reviewed: PCP Follow-up appointment confirmed?: NA Specialist Hospital Follow-up appointment confirmed?: Yes Date of  Specialist follow-up appointment?: 09/11/22 Follow-Up Specialty Provider:: Dr. Michaell Cowing (surgeon) Do you need transportation to your follow-up appointment?: No Do you understand care options if your condition(s) worsen?: Yes-patient verbalized understanding  Jodelle Gross, RN,  BSN, CCM Care Management Coordinator [facility] Phone: [phone]/Fax: [phone]

## 2022-08-23 ENCOUNTER — Inpatient Hospital Stay: Payer: BC Managed Care – PPO | Attending: Hematology | Admitting: Hematology and Oncology

## 2022-08-23 ENCOUNTER — Other Ambulatory Visit (HOSPITAL_COMMUNITY): Payer: BC Managed Care – PPO

## 2022-08-23 ENCOUNTER — Inpatient Hospital Stay: Payer: BC Managed Care – PPO

## 2022-08-23 VITALS — BP 127/74 | HR 77 | Temp 97.7°F | Wt 126.1 lb

## 2022-08-23 DIAGNOSIS — Z170 Estrogen receptor positive status [ER+]: Secondary | ICD-10-CM

## 2022-08-23 DIAGNOSIS — Z79811 Long term (current) use of aromatase inhibitors: Secondary | ICD-10-CM | POA: Diagnosis not present

## 2022-08-23 DIAGNOSIS — C50411 Malignant neoplasm of upper-outer quadrant of right female breast: Secondary | ICD-10-CM

## 2022-08-23 DIAGNOSIS — M810 Age-related osteoporosis without current pathological fracture: Secondary | ICD-10-CM | POA: Insufficient documentation

## 2022-08-23 DIAGNOSIS — Z923 Personal history of irradiation: Secondary | ICD-10-CM | POA: Diagnosis not present

## 2022-08-23 DIAGNOSIS — C773 Secondary and unspecified malignant neoplasm of axilla and upper limb lymph nodes: Secondary | ICD-10-CM | POA: Diagnosis not present

## 2022-08-23 DIAGNOSIS — Z9221 Personal history of antineoplastic chemotherapy: Secondary | ICD-10-CM | POA: Diagnosis not present

## 2022-08-23 LAB — CBC WITH DIFFERENTIAL (CANCER CENTER ONLY)
Abs Immature Granulocytes: 0.05 10*3/uL (ref 0.00–0.07)
Basophils Absolute: 0.1 10*3/uL (ref 0.0–0.1)
Basophils Relative: 1 %
Eosinophils Absolute: 0.4 10*3/uL (ref 0.0–0.5)
Eosinophils Relative: 9 %
HCT: 34.4 % — ABNORMAL LOW (ref 36.0–46.0)
Hemoglobin: 11.5 g/dL — ABNORMAL LOW (ref 12.0–15.0)
Immature Granulocytes: 1 %
Lymphocytes Relative: 19 %
Lymphs Abs: 0.9 10*3/uL (ref 0.7–4.0)
MCH: 29.9 pg (ref 26.0–34.0)
MCHC: 33.4 g/dL (ref 30.0–36.0)
MCV: 89.4 fL (ref 80.0–100.0)
Monocytes Absolute: 0.5 10*3/uL (ref 0.1–1.0)
Monocytes Relative: 10 %
Neutro Abs: 2.7 10*3/uL (ref 1.7–7.7)
Neutrophils Relative %: 60 %
Platelet Count: 258 10*3/uL (ref 150–400)
RBC: 3.85 MIL/uL — ABNORMAL LOW (ref 3.87–5.11)
RDW: 14.2 % (ref 11.5–15.5)
WBC Count: 4.6 10*3/uL (ref 4.0–10.5)
nRBC: 0 % (ref 0.0–0.2)

## 2022-08-23 LAB — CMP (CANCER CENTER ONLY)
ALT: 27 U/L (ref 0–44)
AST: 23 U/L (ref 15–41)
Albumin: 3.9 g/dL (ref 3.5–5.0)
Alkaline Phosphatase: 86 U/L (ref 38–126)
Anion gap: 5 (ref 5–15)
BUN: 15 mg/dL (ref 6–20)
CO2: 31 mmol/L (ref 22–32)
Calcium: 9.1 mg/dL (ref 8.9–10.3)
Chloride: 104 mmol/L (ref 98–111)
Creatinine: 0.59 mg/dL (ref 0.44–1.00)
GFR, Estimated: >60 mL/min (ref >60)
Glucose, Bld: 123 mg/dL — ABNORMAL HIGH (ref 70–99)
Potassium: 4.2 mmol/L (ref 3.5–5.1)
Sodium: 140 mmol/L (ref 135–145)
Total Bilirubin: 0.5 mg/dL (ref 0.3–1.2)
Total Protein: 6.7 g/dL (ref 6.5–8.1)

## 2022-08-23 MED ORDER — ANASTROZOLE 1 MG PO TABS: 1.0000 mg | ORAL_TABLET | Freq: Every day | ORAL | 3 refills | Status: DC

## 2022-08-23 NOTE — Assessment & Plan Note (Signed)
07/28/2019:Screening mammogram detected right breast mass 11:30 position subareolar 1.3 cm, indeterminate 5 mm mass: Biopsy fibroadenoma, right axillary lymph node present, biopsy of the lymph node and the mass revealed grade 2 IDC ER 10%, PR 0%, Ki-67 45%, HER-2 +3+ by IHC  T1 cN1 M0 stage IIa   Treatment plan: 1. Neoadjuvant chemotherapy with TCH Perjeta 6 cycles completed 12/16/19 (Perjeta D/Ced due to diarrhea) followed by Herceptin maintenance for 1 year 2. 01/14/2020: Right lumpectomy: Benign, no evidence of residual cancer, 0/4 lymph nodes negative, additional margins also negative, complete pathologic response (pathologic complete response) completed Herceptin 08/24/2020 3. Followed by adjuvant radiation therapy completed 03/30/2020 4.  Antiestrogen therapy with anastrozole 1 mg daily x5 years starting 07/13/2020 5.  Neratinib September 2022-September 2023   Breast MRI: 3.8 cm tumor and 1 axillary lymph node.  ---------------------------------------------------------------------------------------------------------------------------------------------------- Treatment plan: Anastrozole 1 mg daily started 07/13/2020, neratinib started September 2022    Echocardiogram 05/31/20: EF 65-70 % Trigger finger surgery: Successful    Anastrozole toxicities: Denies any adverse effects to anastrozole therapy. Breast cancer surveillance: Mammogram 08/16/2022: Benign breast density category B 2.  Mammogram and ultrasound 08/14/2021: 5 mm suspicious mass left breast 11 o'clock position biopsy: Fat necrosis 3.  Breast exam 08/23/2022: Benign  Hospitalization: 08/17/2022-08/20/2022: Recurrent sigmoid diverticulitis: Robotic low anterior rectosigmoid resection  Return to clinic in 1 year for follow-up

## 2022-09-06 ENCOUNTER — Inpatient Hospital Stay: Payer: BC Managed Care – PPO | Attending: Hematology

## 2022-09-06 ENCOUNTER — Other Ambulatory Visit: Payer: Self-pay

## 2022-09-06 VITALS — BP 122/85 | HR 69 | Temp 98.2°F

## 2022-09-06 DIAGNOSIS — M810 Age-related osteoporosis without current pathological fracture: Secondary | ICD-10-CM | POA: Insufficient documentation

## 2022-09-06 DIAGNOSIS — Z79811 Long term (current) use of aromatase inhibitors: Secondary | ICD-10-CM | POA: Insufficient documentation

## 2022-09-06 DIAGNOSIS — C50411 Malignant neoplasm of upper-outer quadrant of right female breast: Secondary | ICD-10-CM | POA: Insufficient documentation

## 2022-09-06 DIAGNOSIS — Z170 Estrogen receptor positive status [ER+]: Secondary | ICD-10-CM | POA: Diagnosis not present

## 2022-09-06 MED ORDER — DENOSUMAB 60 MG/ML ~~LOC~~ SOSY
60.0000 mg | PREFILLED_SYRINGE | Freq: Once | SUBCUTANEOUS | Status: AC
  Administered 2022-09-06: 60 mg via SUBCUTANEOUS
  Filled 2022-09-06: qty 1

## 2022-09-10 ENCOUNTER — Telehealth: Payer: Self-pay | Admitting: *Deleted

## 2022-09-10 NOTE — Telephone Encounter (Signed)
Received call from pt with complaint of bilateral numbness and tingling in her fingers post Prolia injection.  MD out of office.  RN reviewed with [facility] who stated symptoms may be related to hypocalcemia and verbal orders receive for pt to take OTC Tums 3 times a day and f/u with office tomorrow if symptoms do not resolve.  If symptoms still present pt will need lab and [facility] visit.  Pt educated and verbalized understanding.

## 2022-09-11 ENCOUNTER — Inpatient Hospital Stay: Payer: BC Managed Care – PPO

## 2022-09-11 ENCOUNTER — Other Ambulatory Visit: Payer: Self-pay

## 2022-09-11 ENCOUNTER — Inpatient Hospital Stay (HOSPITAL_BASED_OUTPATIENT_CLINIC_OR_DEPARTMENT_OTHER): Payer: BC Managed Care – PPO | Admitting: Physician Assistant

## 2022-09-11 ENCOUNTER — Telehealth: Payer: Self-pay | Admitting: *Deleted

## 2022-09-11 VITALS — BP 131/78 | HR 70 | Temp 98.0°F | Resp 16 | Wt 123.4 lb

## 2022-09-11 DIAGNOSIS — G629 Polyneuropathy, unspecified: Secondary | ICD-10-CM | POA: Diagnosis not present

## 2022-09-11 DIAGNOSIS — C50411 Malignant neoplasm of upper-outer quadrant of right female breast: Secondary | ICD-10-CM

## 2022-09-11 DIAGNOSIS — Z170 Estrogen receptor positive status [ER+]: Secondary | ICD-10-CM | POA: Diagnosis not present

## 2022-09-11 DIAGNOSIS — M810 Age-related osteoporosis without current pathological fracture: Secondary | ICD-10-CM | POA: Diagnosis not present

## 2022-09-11 DIAGNOSIS — Z79811 Long term (current) use of aromatase inhibitors: Secondary | ICD-10-CM | POA: Diagnosis not present

## 2022-09-11 LAB — CBC WITH DIFFERENTIAL (CANCER CENTER ONLY)
Abs Immature Granulocytes: 0.02 10*3/uL (ref 0.00–0.07)
Basophils Absolute: 0.1 10*3/uL (ref 0.0–0.1)
Basophils Relative: 1 %
Eosinophils Absolute: 0.4 10*3/uL (ref 0.0–0.5)
Eosinophils Relative: 9 %
HCT: 36.1 % (ref 36.0–46.0)
Hemoglobin: 11.8 g/dL — ABNORMAL LOW (ref 12.0–15.0)
Immature Granulocytes: 0 %
Lymphocytes Relative: 29 %
Lymphs Abs: 1.4 10*3/uL (ref 0.7–4.0)
MCH: 29.4 pg (ref 26.0–34.0)
MCHC: 32.7 g/dL (ref 30.0–36.0)
MCV: 89.8 fL (ref 80.0–100.0)
Monocytes Absolute: 0.5 10*3/uL (ref 0.1–1.0)
Monocytes Relative: 10 %
Neutro Abs: 2.5 10*3/uL (ref 1.7–7.7)
Neutrophils Relative %: 51 %
Platelet Count: 252 10*3/uL (ref 150–400)
RBC: 4.02 MIL/uL (ref 3.87–5.11)
RDW: 13.4 % (ref 11.5–15.5)
WBC Count: 4.8 10*3/uL (ref 4.0–10.5)
nRBC: 0 % (ref 0.0–0.2)

## 2022-09-11 LAB — CMP (CANCER CENTER ONLY)
ALT: 18 U/L (ref 0–44)
AST: 18 U/L (ref 15–41)
Albumin: 4.1 g/dL (ref 3.5–5.0)
Alkaline Phosphatase: 71 U/L (ref 38–126)
Anion gap: 4 — ABNORMAL LOW (ref 5–15)
BUN: 9 mg/dL (ref 6–20)
CO2: 29 mmol/L (ref 22–32)
Calcium: 9.5 mg/dL (ref 8.9–10.3)
Chloride: 108 mmol/L (ref 98–111)
Creatinine: 0.67 mg/dL (ref 0.44–1.00)
GFR, Estimated: >60 mL/min (ref >60)
Glucose, Bld: 98 mg/dL (ref 70–99)
Potassium: 4.1 mmol/L (ref 3.5–5.1)
Sodium: 141 mmol/L (ref 135–145)
Total Bilirubin: 0.4 mg/dL (ref 0.3–1.2)
Total Protein: 6.9 g/dL (ref 6.5–8.1)

## 2022-09-11 LAB — MAGNESIUM: Magnesium: 2 mg/dL (ref 1.7–2.4)

## 2022-09-11 NOTE — Progress Notes (Signed)
Symptom Management Consult Note [facility]    Patient Care Team: Raliegh Ip, DO as PCP - General (Family Medicine) Pershing Proud, RN as Oncology Nurse Navigator Donnelly Angelica, RN as Oncology Nurse Navigator Jarold Motto, Marzella Schlein, RN (Inactive) as Registered Nurse Cynda Familia, RN (Inactive) as Registered Nurse Serena Croissant, MD as Medical Oncologist (Hematology and Oncology) Anselm Lis, RPH-CPP as Pharmacist (Hematology and Oncology) Karie Soda, MD as Consulting Physician (General Surgery) Lynann Bologna, MD as Consulting Physician (Gastroenterology)    Name / MRN / DOB: Stephanie Brewer  [id number]  1965/12/14   Date of visit: 09/11/2022   Chief Complaint/Reason for visit: numbness in fingers and toes   Current Therapy: Anastrozole and Prolia  Last treatment:  Treatment 1 on 09/06/22   ASSESSMENT & PLAN: Patient is a 57 y.o. female  with oncologic history of malignant neoplasm of upper-outer quadrant of right breast, estrogen receptor positive followed by Dr. Pamelia Hoit.  I have viewed most recent oncology note and lab work.    #Malignant neoplasm of upper-outer quadrant of right breast in female, estrogen receptor positive  - Next appointment with oncologist is 08/27/23   #Neuropathy -Sudden onset starting day after Prolia injection -CBC with stable anemia. CMP without significant electrolyte derangement, specifically normal calcium.. -Patient has subjective decreased sensation in finger tips and toes 4 and 5 on both feet. No physical abnormality. Normal cap refill.  -Discussed with patient causes of neuropathy which includes Taxotere, the chemotherapy she previously had.  The onset was 24 hours after first Prolia injection, it would be an unusual side effect. -Patient prefers to try hand exercises and massage to see if symptoms resolve and hold off on starting any medication like gabapentin at this time.  I feel this is reasonable  given how recently symptoms started. -Chart review shows patient had recent mammogram on 08/16/2022 which showed no evidence of recurrence. -Patient will have telephone follow-up visit with me in 1 week to disucss symptoms.   Strict ED precautions discussed should symptoms worsen.    Heme/Onc History: Oncology History  Malignant neoplasm of upper-outer quadrant of right breast in female, estrogen receptor positive (HCC)  07/28/2019 Initial Diagnosis   Screening mammogram detected right breast mass 11:30 position subareolar 1.3 cm, indeterminate 5 mm mass: Biopsy fibroadenoma, right axillary lymph node present, biopsy of the lymph node and the mass revealed grade 2 IDC ER 10%, PR 0%, Ki-67 45%, HER-2 +3+ by [facility]   08/20/2019 Cancer Staging   Staging form: Breast, AJCC 8th Edition - Clinical stage from 08/20/2019: Stage IIA (cT2, cN1, cM0, G2, ER+, PR-, HER2+) - Signed by Lonie Peak, MD on 01/26/2020   09/02/2019 - 12/18/2019 Chemotherapy   The patient had dexamethasone (DECADRON) 4 MG tablet, 4 mg (100 % of original dose 4 mg), Oral, Daily, 1 of 1 cycle, Start date: 08/20/2019, End date: 01/06/2020 Dose modification: 4 mg (original dose 4 mg, Cycle 0) palonosetron (ALOXI) injection 0.25 mg, 0.25 mg, Intravenous,  Once, 6 of 6 cycles Administration: 0.25 mg (09/02/2019), 0.25 mg (09/23/2019), 0.25 mg (11/25/2019), 0.25 mg (12/16/2019), 0.25 mg (10/14/2019), 0.25 mg (11/04/2019) pegfilgrastim-jmdb (FULPHILA) injection 6 mg, 6 mg, Subcutaneous,  Once, 6 of 6 cycles Administration: 6 mg (09/04/2019), 6 mg (09/25/2019), 6 mg (11/27/2019), 6 mg (12/18/2019), 6 mg (10/16/2019), 6 mg (11/06/2019) CARBOplatin (PARAPLATIN) 700 mg in sodium chloride 0.9 % 250 mL chemo infusion, 700 mg (100 % of original dose 700 mg), Intravenous,  Once,  6 of 6 cycles Dose modification: 700 mg (original dose 700 mg, Cycle 1), 700 mg (original dose 700 mg, Cycle 5), 600 mg (original dose 700 mg, Cycle 5, Reason: Other (see comments), Comment:  dose reduce to 600 mg for CINV per Dr. Pamelia Hoit) Administration: 700 mg (09/02/2019), 700 mg (09/23/2019), 600 mg (11/25/2019), 600 mg (12/16/2019), 700 mg (10/14/2019), 600 mg (11/04/2019) DOCEtaxel (TAXOTERE) 150 mg in sodium chloride 0.9 % 250 mL chemo infusion, 75 mg/m2 = 150 mg, Intravenous,  Once, 6 of 6 cycles Dose modification: 50 mg/m2 (original dose 75 mg/m2, Cycle 5, Reason: Dose not tolerated), 50 mg/m2 (original dose 75 mg/m2, Cycle 4, Reason: Dose not tolerated) Administration: 150 mg (09/02/2019), 150 mg (09/23/2019), 100 mg (11/25/2019), 100 mg (12/16/2019), 150 mg (10/14/2019), 100 mg (11/04/2019) fosaprepitant (EMEND) 150 mg in sodium chloride 0.9 % 145 mL IVPB, 150 mg, Intravenous,  Once, 6 of 6 cycles Administration: 150 mg (09/02/2019), 150 mg (09/23/2019), 150 mg (11/25/2019), 150 mg (12/16/2019), 150 mg (10/14/2019), 150 mg (11/04/2019) pertuzumab (PERJETA) 420 mg in sodium chloride 0.9 % 250 mL chemo infusion, 420 mg (100 % of original dose 420 mg), Intravenous, Once, 5 of 5 cycles Dose modification: 420 mg (original dose 420 mg, Cycle 1, Reason: Provider Judgment) Administration: 420 mg (09/02/2019), 420 mg (09/23/2019), 420 mg (11/25/2019), 420 mg (10/14/2019), 420 mg (11/04/2019) trastuzumab-dkst (OGIVRI) 672 mg in sodium chloride 0.9 % 250 mL chemo infusion, 8 mg/kg = 672 mg, Intravenous,  Once, 6 of 6 cycles Administration: 672 mg (09/02/2019), 504 mg (09/23/2019), 504 mg (11/25/2019), 504 mg (12/16/2019), 504 mg (10/14/2019), 504 mg (11/04/2019)  for chemotherapy treatment.    01/06/2020 - 08/24/2020 Chemotherapy         01/14/2020 Surgery   Right lumpectomy Dwain Sarna): no evidence of residual carcinoma, 4 right axillary lymph nodes negative for carcinoma.   02/16/2020 - 03/30/2020 Radiation Therapy   Adjuvant radiation       Interval history-: Stephanie Brewer is a 57 y.o. female with oncologic history as above presenting to [facility] today with chief complaint of numbness in fingers and toes.  She is  accompanied by her mother who provides additional history.  Patient states she had her first Prolia injection the day before symptoms started.  Injection was on Friday and on Saturday she noticed mild tingling sensation in fingers 3 4 and 5 on bilateral hands as well as toes 4 and 5 on bilateral feet.  She did not think anything of it because symptoms were mild.  The next day the tingling sensation intensified and was severe created in all of her fingers.  She put her fingers on ice which helped resolve the pain.  She states the tingling is still present although not as severe as it was.  Besides the Prolia she denies any new medications.  She had chemotherapy with Taxotere in the past which she iced during.  She never had symptoms of neuropathy during treatment.  Patient tried taking Tums yesterday to see if symptoms would improve incase it was being caused by low calcium. She denies any injury.      ROS  All other systems are reviewed and are negative for acute change except as noted in the HPI.    Allergies  Allergen Reactions   Hyoscyamine Anaphylaxis   Fosamax [Alendronate] Nausea Only and Other (See Comments)    SEVERE NAUSEA   Nexium [Esomeprazole] Swelling and Other (See Comments)    Tongue became swollen- breathing not affected   Doxycycline  Palpitations and Other (See Comments)    Racing heart     Past Medical History:  Diagnosis Date   Anemia    Anxiety    Breast cancer (HCC)    Clotting disorder (HCC)    Displacement of breast implant 12/24/2017   Diverticulitis    Diverticulosis    DVT (deep venous thrombosis) (HCC)    left leg knee and ankle    GERD (gastroesophageal reflux disease)    History of kidney stones    Kidney stones    Pneumonia    Pre-diabetes      Past Surgical History:  Procedure Laterality Date   BREAST BIOPSY Left 08/15/2021   BREAST ENHANCEMENT SURGERY     Augmentation 2003   BREAST IMPLANT REMOVAL Bilateral 01/14/2020   Procedure:  REMOVAL BREAST IMPLANTS;  Surgeon: Allena Napoleon, MD;  Location: [facility];  Service: Plastics;  Laterality: Bilateral;   BREAST LUMPECTOMY Right 01/2020   BREAST LUMPECTOMY WITH RADIOACTIVE SEED AND SENTINEL LYMPH NODE BIOPSY Right 01/14/2020   Procedure: Right breast seed localized lumpectomy with sentinel lymph node biopsy;  Surgeon: Emelia Loron, MD;  Location: [facility];  Service: General;  Laterality: Right;   BUNIONECTOMY Bilateral 2005   Right foot in 2006   LIPOSUCTION WITH LIPOFILLING Bilateral 11/29/2020   Procedure: LIPOSUCTION WITH LIPOFILLING FROM ABDOMEN TO BILATERAL BREASTS;  Surgeon: Allena Napoleon, MD;  Location: [facility];  Service: Plastics;  Laterality: Bilateral;   MASTOPEXY Left 11/29/2020   Procedure: LEFT MASTOPEXY;  Surgeon: Allena Napoleon, MD;  Location: [facility];  Service: Plastics;  Laterality: Left;   PORTACATH PLACEMENT N/A 09/01/2019   Procedure: INSERTION PORT-A-CATH WITH ULTRASOUND GUIDANCE;  Surgeon: Emelia Loron, MD;  Location: [facility];  Service: General;  Laterality: N/A;   PROCTOSCOPY N/A 08/17/2022   Procedure: RIGID PROCTOSCOPY;  Surgeon: Karie Soda, MD;  Location: [facility];  Service: General;  Laterality: N/A;   TRIGGER FINGER RELEASE Left 05/09/2020   Procedure: RELEASE TRIGGER FINGER/A-1 PULLEY LEFT LONG;  Surgeon: Betha Loa, MD;  Location: [facility];  Service: Orthopedics;  Laterality: Left;   UMBILICAL HERNIA REPAIR  2002    Social History   Socioeconomic History   Marital status: Married    Spouse name: Tim   Number of children: 2   Years of education: some college   Highest education level: Not on file  Occupational History   Occupation: [employer] banker, corporate  Tobacco Use   Smoking status: Never   Smokeless tobacco: Never  Vaping Use   Vaping Use: Never used  Substance and Sexual Activity   Alcohol use: Yes    Comment: 1 PER  MONTH   Drug use: No   Sexual activity: Yes    Birth control/protection: I.U.D.  Other Topics Concern   Not on file  Social History Narrative   Lives with spouse   Caffeine use: no caffeine    Left handed    Works for an Chartered certified accountant to read fiction   Social Determinants of Health   Financial Resource Strain: Low Risk  (05/29/2022)   Overall Financial Resource Strain (CARDIA)    Difficulty of Paying Living Expenses: Not hard at all  Food Insecurity: No Food Insecurity (08/17/2022)   Hunger Vital Sign    Worried About Running Out of Food in the Last Year: Never true    Ran Out of Food in the Last Year:  Never true  Transportation Needs: No Transportation Needs (08/17/2022)   PRAPARE - Administrator, Civil Service (Medical): No    Lack of Transportation (Non-Medical): No  Physical Activity: Insufficiently Active (07/17/2022)   Exercise Vital Sign    Days of Exercise per Week: 3 days    Minutes of Exercise per Session: 30 min  Stress: No Stress Concern Present (07/17/2022)   [employer] of Occupational Health - Occupational Stress Questionnaire    Feeling of Stress : Only a little  Social Connections: Socially Integrated (07/17/2022)   Social Connection and Isolation Panel [NHANES]    Frequency of Communication with Friends and Family: More than three times a week    Frequency of Social Gatherings with Friends and Family: More than three times a week    Attends Religious Services: More than 4 times per year    Active Member of [employer] or Organizations: Yes    Attends Engineer, structural: More than 4 times per year    Marital Status: Married  Catering manager Violence: Not At Risk (08/17/2022)   Humiliation, Afraid, Rape, and Kick questionnaire    Fear of Current or Ex-Partner: No    Emotionally Abused: No    Physically Abused: No    Sexually Abused: No    Family History  Problem Relation Age of Onset   Hyperlipidemia Mother    Polymyalgia  rheumatica Mother    Hypertension Father    Hyperlipidemia Father    Diabetes type I Brother    Ovarian cancer Maternal Aunt    Diabetes type II Maternal Grandfather    Allergies Daughter    Colon cancer Neg Hx    Esophageal cancer Neg Hx    Rectal cancer Neg Hx    Stomach cancer Neg Hx      Current Outpatient Medications:    anastrozole (ARIMIDEX) 1 MG tablet, Take 1 tablet (1 mg total) by mouth at bedtime., Disp: 90 tablet, Rfl: 3   glucose blood test strip, UAD to check sugar. R73.03, Disp: 100 each, Rfl: 12   Lancets Thin MISC, UAD to check sugar. R73.03, Disp: 100 each, Rfl: 12   ondansetron (ZOFRAN) 4 MG tablet, Take 1 tablet (4 mg total) by mouth every 6 (six) hours as needed for nausea or vomiting., Disp: 8 tablet, Rfl: 2   Semaglutide-Weight Management (WEGOVY) 2.4 MG/0.75ML SOAJ, Inject 2.4 mg into the skin every 7 (seven) days. (Patient taking differently: Inject 2.4 mg into the skin See admin instructions. Inject 2.4 mg into the skin every 7-28 days), Disp: 9 mL, Rfl: 3   traMADol (ULTRAM) 50 MG tablet, Take 1-2 tablets (50-100 mg total) by mouth every 6 (six) hours as needed for moderate pain or severe pain., Disp: 20 tablet, Rfl: 0   TYLENOL 325 MG tablet, Take 325 mg by mouth every 6 (six) hours as needed for headache or mild pain., Disp: , Rfl:    XARELTO 20 MG TABS tablet, TAKE ONE TABLET ONCE DAILY WITH SUPPER (Patient taking differently: Take 20 mg by mouth in the morning.), Disp: 30 tablet, Rfl: 12  PHYSICAL EXAM: ECOG FS:1 - Symptomatic but completely ambulatory    Vitals:   09/11/22 1256  BP: 131/78  Pulse: 70  Resp: 16  Temp: 98 F (36.7 C)  TempSrc: Oral  SpO2: 100%  Weight: 123 lb 6.4 oz (56 kg)   Physical Exam Vitals and nursing note reviewed.  Constitutional:      Appearance: She is not ill-appearing  or toxic-appearing.  HENT:     Head: Normocephalic.  Eyes:     Conjunctiva/sclera: Conjunctivae normal.  Cardiovascular:     Rate and Rhythm:  Normal rate.     Pulses: Normal pulses.          Radial pulses are 2+ on the right side and 2+ on the left side.  Pulmonary:     Effort: Pulmonary effort is normal.  Abdominal:     General: There is no distension.  Musculoskeletal:     Cervical back: Normal range of motion.     Right lower leg: No edema.     Left lower leg: No edema.     Comments: Compartments are soft in all extremities.  Skin:    General: Skin is warm and dry.     Capillary Refill: Capillary refill takes less than 2 seconds.  Neurological:     Mental Status: She is alert.     Comments: Strong and equal grip strength in all extremities. Subjective decrease sensation to pads of all fingers and toes 4 and 5 on bilateral feet.        LABORATORY DATA: I have reviewed the data as listed    Latest Ref Rng & Units 09/11/2022   12:28 PM 08/23/2022    8:14 AM 08/20/2022    4:31 AM  CBC  WBC 4.0 - 10.5 K/uL 4.8  4.6    Hemoglobin 12.0 - 15.0 g/dL 16.1  09.6  9.4   Hematocrit 36.0 - 46.0 % 36.1  34.4    Platelets 150 - 400 K/uL 252  258          Latest Ref Rng & Units 09/11/2022   12:28 PM 08/23/2022    8:14 AM 08/18/2022    4:17 AM  CMP  Glucose 70 - 99 mg/dL 98  045  409   BUN 6 - 20 mg/dL 9  15  9    Creatinine 0.44 - 1.00 mg/dL 8.11  9.14  7.82   Sodium 135 - 145 mmol/L 141  140  133   Potassium 3.5 - 5.1 mmol/L 4.1  4.2  4.1   Chloride 98 - 111 mmol/L 108  104  102   CO2 22 - 32 mmol/L 29  31  23    Calcium 8.9 - 10.3 mg/dL 9.5  9.1  8.3   Total Protein 6.5 - 8.1 g/dL 6.9  6.7    Total Bilirubin 0.3 - 1.2 mg/dL 0.4  0.5    Alkaline Phos 38 - 126 U/L 71  86    AST 15 - 41 U/L 18  23    ALT 0 - 44 U/L 18  27         RADIOGRAPHIC STUDIES (from last 24 hours if applicable) I have personally reviewed the radiological images as listed and agreed with the findings in the report. No results found.      Visit Diagnosis: 1. Malignant neoplasm of upper-outer quadrant of right breast in female, estrogen  receptor positive (HCC)   2. Neuropathy      No orders of the defined types were placed in this encounter.   All questions were answered. The patient knows to call the clinic with any problems, questions or concerns. No barriers to learning was detected.  A total of more than 20 minutes were spent on this encounter with face-to-face time and non-face-to-face time, including preparing to see the patient, ordering tests, counseling the patient and coordination of care as outlined above.  Thank you for allowing me to participate in the care of this patient.    Shanon Ace, PA-C Department of Hematology/Oncology [facility] Phone: [phone]  [phone]    09/11/2022 4:22 PM

## 2022-09-11 NOTE — Telephone Encounter (Signed)
Received call from pt with complaint of ongoing numbness and tingling in bilateral hands despite OTC calcium supplementation with tums.  Lab and [facility] visit scheduled for further evaluation.

## 2022-09-18 ENCOUNTER — Telehealth: Payer: Self-pay

## 2022-09-18 ENCOUNTER — Inpatient Hospital Stay (HOSPITAL_BASED_OUTPATIENT_CLINIC_OR_DEPARTMENT_OTHER): Payer: BC Managed Care – PPO | Admitting: Physician Assistant

## 2022-09-18 DIAGNOSIS — G629 Polyneuropathy, unspecified: Secondary | ICD-10-CM | POA: Diagnosis not present

## 2022-09-18 DIAGNOSIS — Z170 Estrogen receptor positive status [ER+]: Secondary | ICD-10-CM

## 2022-09-18 DIAGNOSIS — C50411 Malignant neoplasm of upper-outer quadrant of right female breast: Secondary | ICD-10-CM

## 2022-09-18 NOTE — Telephone Encounter (Signed)
This RN called and spoke with patient per request of Dorthula Rue, PA-C. Patient verbalized understanding that her prolia will be discontinued per the recommendation of Dr. Pamelia Hoit due to her symptoms starting immediately after the injection. No further questions or concerns at this time.

## 2022-09-18 NOTE — Progress Notes (Signed)
I connected with Stephanie Brewer on 09/18/22 at 10:00 AM EDT by telephone and verified that I am speaking with the correct person using two identifiers.   I discussed the limitations, risks, security and privacy concerns of performing an evaluation and management service by telemedicine and the availability of in-person appointments. I also discussed with the patient that there may be a patient responsible charge related to this service. The patient expressed understanding and agreed to proceed.   Other persons participating in the visit and their role in the encounter: none   Patient's location: home  Provider's location: [facility] office       Symptom Management Consult note [facility]    Patient Care Team: Stephanie Ip, DO as PCP - General (Family Medicine) Stephanie Proud, RN as Oncology Nurse Navigator Stephanie Angelica, RN as Oncology Nurse Navigator Stephanie Familia, RN (Inactive) as Registered Nurse Stephanie Familia, RN (Inactive) as Registered Nurse Stephanie Croissant, MD as Medical Oncologist (Hematology and Oncology) Stephanie Brewer, RPH-CPP as Pharmacist (Hematology and Oncology) Stephanie Soda, MD as Consulting Physician (General Surgery) Stephanie Bologna, MD as Consulting Physician (Gastroenterology)    Name of the patient: Stephanie Brewer  [id number]  01-24-1966   Date of visit: 09/18/2022    Chief complaint/ Reason for visit- follow up  Oncology History  Malignant neoplasm of upper-outer quadrant of right breast in female, estrogen receptor positive (HCC)  07/28/2019 Initial Diagnosis   Screening mammogram detected right breast mass 11:30 position subareolar 1.3 cm, indeterminate 5 mm mass: Biopsy fibroadenoma, right axillary lymph node present, biopsy of the lymph node and the mass revealed grade 2 IDC ER 10%, PR 0%, Ki-67 45%, HER-2 +3+ by [facility]   08/20/2019 Cancer Staging   Staging form: Breast, AJCC 8th Edition - Clinical stage from 08/20/2019: Stage  IIA (cT2, cN1, cM0, G2, ER+, PR-, HER2+) - Signed by Lonie Peak, MD on 01/26/2020   09/02/2019 - 12/18/2019 Chemotherapy   The patient had dexamethasone (DECADRON) 4 MG tablet, 4 mg (100 % of original dose 4 mg), Oral, Daily, 1 of 1 cycle, Start date: 08/20/2019, End date: 01/06/2020 Dose modification: 4 mg (original dose 4 mg, Cycle 0) palonosetron (ALOXI) injection 0.25 mg, 0.25 mg, Intravenous,  Once, 6 of 6 cycles Administration: 0.25 mg (09/02/2019), 0.25 mg (09/23/2019), 0.25 mg (11/25/2019), 0.25 mg (12/16/2019), 0.25 mg (10/14/2019), 0.25 mg (11/04/2019) pegfilgrastim-jmdb (FULPHILA) injection 6 mg, 6 mg, Subcutaneous,  Once, 6 of 6 cycles Administration: 6 mg (09/04/2019), 6 mg (09/25/2019), 6 mg (11/27/2019), 6 mg (12/18/2019), 6 mg (10/16/2019), 6 mg (11/06/2019) CARBOplatin (PARAPLATIN) 700 mg in sodium chloride 0.9 % 250 mL chemo infusion, 700 mg (100 % of original dose 700 mg), Intravenous,  Once, 6 of 6 cycles Dose modification: 700 mg (original dose 700 mg, Cycle 1), 700 mg (original dose 700 mg, Cycle 5), 600 mg (original dose 700 mg, Cycle 5, Reason: Other (see comments), Comment: dose reduce to 600 mg for CINV per Dr. Pamelia Hoit) Administration: 700 mg (09/02/2019), 700 mg (09/23/2019), 600 mg (11/25/2019), 600 mg (12/16/2019), 700 mg (10/14/2019), 600 mg (11/04/2019) DOCEtaxel (TAXOTERE) 150 mg in sodium chloride 0.9 % 250 mL chemo infusion, 75 mg/m2 = 150 mg, Intravenous,  Once, 6 of 6 cycles Dose modification: 50 mg/m2 (original dose 75 mg/m2, Cycle 5, Reason: Dose not tolerated), 50 mg/m2 (original dose 75 mg/m2, Cycle 4, Reason: Dose not tolerated) Administration: 150 mg (09/02/2019), 150 mg (09/23/2019), 100 mg (11/25/2019), 100 mg (12/16/2019), 150 mg (  10/14/2019), 100 mg (11/04/2019) fosaprepitant (EMEND) 150 mg in sodium chloride 0.9 % 145 mL IVPB, 150 mg, Intravenous,  Once, 6 of 6 cycles Administration: 150 mg (09/02/2019), 150 mg (09/23/2019), 150 mg (11/25/2019), 150 mg (12/16/2019), 150 mg (10/14/2019), 150 mg  (11/04/2019) pertuzumab (PERJETA) 420 mg in sodium chloride 0.9 % 250 mL chemo infusion, 420 mg (100 % of original dose 420 mg), Intravenous, Once, 5 of 5 cycles Dose modification: 420 mg (original dose 420 mg, Cycle 1, Reason: Provider Judgment) Administration: 420 mg (09/02/2019), 420 mg (09/23/2019), 420 mg (11/25/2019), 420 mg (10/14/2019), 420 mg (11/04/2019) trastuzumab-dkst (OGIVRI) 672 mg in sodium chloride 0.9 % 250 mL chemo infusion, 8 mg/kg = 672 mg, Intravenous,  Once, 6 of 6 cycles Administration: 672 mg (09/02/2019), 504 mg (09/23/2019), 504 mg (11/25/2019), 504 mg (12/16/2019), 504 mg (10/14/2019), 504 mg (11/04/2019)  for chemotherapy treatment.    01/06/2020 - 08/24/2020 Chemotherapy         01/14/2020 Surgery   Right lumpectomy Stephanie Brewer): no evidence of residual carcinoma, 4 right axillary lymph nodes negative for carcinoma.   02/16/2020 - 03/30/2020 Radiation Therapy   Adjuvant radiation     Current Therapy: Anastrozole and Prolia   Interval history- Stephanie Brewer is a 57 yo female with oncologic history of malignant neoplasm of upper-outer quadrant of right breast, ER positive contacted today via telephone for follow-up visit.  Patient was seen in clinic last week for sudden onset of peripheral neuropathy after receiving her first Prolia injection.  Patient states her symptoms have drastically improved.  She has been doing hand exercises as well as massage.  She states the tingling sensation is mild and intermittent.  It is not affecting her quality of life or ADLs.  She reports the tingling sensation in her toes has resolved.  ROS  All other systems are reviewed and are negative for acute change except as noted in the HPI.    Allergies  Allergen Reactions   Hyoscyamine Anaphylaxis   Fosamax [Alendronate] Nausea Only and Other (See Comments)    SEVERE NAUSEA   Nexium [Esomeprazole] Swelling and Other (See Comments)    Tongue became swollen- breathing not affected    Doxycycline Palpitations and Other (See Comments)    Racing heart     Past Medical History:  Diagnosis Date   Anemia    Anxiety    Breast cancer (HCC)    Clotting disorder (HCC)    Displacement of breast implant 12/24/2017   Diverticulitis    Diverticulosis    DVT (deep venous thrombosis) (HCC)    left leg knee and ankle    GERD (gastroesophageal reflux disease)    History of kidney stones    Kidney stones    Pneumonia    Pre-diabetes      Past Surgical History:  Procedure Laterality Date   BREAST BIOPSY Left 08/15/2021   BREAST ENHANCEMENT SURGERY     Augmentation 2003   BREAST IMPLANT REMOVAL Bilateral 01/14/2020   Procedure: REMOVAL BREAST IMPLANTS;  Surgeon: Allena Napoleon, MD;  Location: [facility];  Service: Plastics;  Laterality: Bilateral;   BREAST LUMPECTOMY Right 01/2020   BREAST LUMPECTOMY WITH RADIOACTIVE SEED AND SENTINEL LYMPH NODE BIOPSY Right 01/14/2020   Procedure: Right breast seed localized lumpectomy with sentinel lymph node biopsy;  Surgeon: Emelia Loron, MD;  Location: [facility];  Service: General;  Laterality: Right;   BUNIONECTOMY Bilateral 2005   Right foot in 2006   LIPOSUCTION WITH LIPOFILLING  Bilateral 11/29/2020   Procedure: LIPOSUCTION WITH LIPOFILLING FROM ABDOMEN TO BILATERAL BREASTS;  Surgeon: Allena Napoleon, MD;  Location: [facility];  Service: Plastics;  Laterality: Bilateral;   MASTOPEXY Left 11/29/2020   Procedure: LEFT MASTOPEXY;  Surgeon: Allena Napoleon, MD;  Location: [facility];  Service: Plastics;  Laterality: Left;   PORTACATH PLACEMENT N/A 09/01/2019   Procedure: INSERTION PORT-A-CATH WITH ULTRASOUND GUIDANCE;  Surgeon: Emelia Loron, MD;  Location: [facility];  Service: General;  Laterality: N/A;   PROCTOSCOPY N/A 08/17/2022   Procedure: RIGID PROCTOSCOPY;  Surgeon: Stephanie Soda, MD;  Location: [facility];  Service: General;  Laterality: N/A;    TRIGGER FINGER RELEASE Left 05/09/2020   Procedure: RELEASE TRIGGER FINGER/A-1 PULLEY LEFT LONG;  Surgeon: Betha Loa, MD;  Location: [facility];  Service: Orthopedics;  Laterality: Left;   UMBILICAL HERNIA REPAIR  2002    Social History   Socioeconomic History   Marital status: Married    Spouse name: Tim   Number of children: 2   Years of education: some college   Highest education level: Not on file  Occupational History   Occupation: [employer] banker, corporate  Tobacco Use   Smoking status: Never   Smokeless tobacco: Never  Vaping Use   Vaping Use: Never used  Substance and Sexual Activity   Alcohol use: Yes    Comment: 1 PER MONTH   Drug use: No   Sexual activity: Yes    Birth control/protection: I.U.D.  Other Topics Concern   Not on file  Social History Narrative   Lives with spouse   Caffeine use: no caffeine    Left handed    Works for an Chartered certified accountant to read fiction   Social Determinants of Health   Financial Resource Strain: Low Risk  (05/29/2022)   Overall Financial Resource Strain (CARDIA)    Difficulty of Paying Living Expenses: Not hard at all  Food Insecurity: No Food Insecurity (08/17/2022)   Hunger Vital Sign    Worried About Running Out of Food in the Last Year: Never true    Ran Out of Food in the Last Year: Never true  Transportation Needs: No Transportation Needs (08/17/2022)   PRAPARE - Administrator, Civil Service (Medical): No    Lack of Transportation (Non-Medical): No  Physical Activity: Insufficiently Active (07/17/2022)   Exercise Vital Sign    Days of Exercise per Week: 3 days    Minutes of Exercise per Session: 30 min  Stress: No Stress Concern Present (07/17/2022)   [employer] of Occupational Health - Occupational Stress Questionnaire    Feeling of Stress : Only a little  Social Connections: Socially Integrated (07/17/2022)   Social Connection and Isolation Panel [NHANES]    Frequency of  Communication with Friends and Family: More than three times a week    Frequency of Social Gatherings with Friends and Family: More than three times a week    Attends Religious Services: More than 4 times per year    Active Member of [employer] or Organizations: Yes    Attends Banker Meetings: More than 4 times per year    Marital Status: Married  Catering manager Violence: Not At Risk (08/17/2022)   Humiliation, Afraid, Rape, and Kick questionnaire    Fear of Current or Ex-Partner: No    Emotionally Abused: No    Physically Abused: No    Sexually Abused: No  Family History  Problem Relation Age of Onset   Hyperlipidemia Mother    Polymyalgia rheumatica Mother    Hypertension Father    Hyperlipidemia Father    Diabetes type I Brother    Ovarian cancer Maternal Aunt    Diabetes type II Maternal Grandfather    Allergies Daughter    Colon cancer Neg Hx    Esophageal cancer Neg Hx    Rectal cancer Neg Hx    Stomach cancer Neg Hx      Current Outpatient Medications:    anastrozole (ARIMIDEX) 1 MG tablet, Take 1 tablet (1 mg total) by mouth at bedtime., Disp: 90 tablet, Rfl: 3   glucose blood test strip, UAD to check sugar. R73.03, Disp: 100 each, Rfl: 12   Lancets Thin MISC, UAD to check sugar. R73.03, Disp: 100 each, Rfl: 12   ondansetron (ZOFRAN) 4 MG tablet, Take 1 tablet (4 mg total) by mouth every 6 (six) hours as needed for nausea or vomiting., Disp: 8 tablet, Rfl: 2   Semaglutide-Weight Management (WEGOVY) 2.4 MG/0.75ML SOAJ, Inject 2.4 mg into the skin every 7 (seven) days. (Patient taking differently: Inject 2.4 mg into the skin See admin instructions. Inject 2.4 mg into the skin every 7-28 days), Disp: 9 mL, Rfl: 3   traMADol (ULTRAM) 50 MG tablet, Take 1-2 tablets (50-100 mg total) by mouth every 6 (six) hours as needed for moderate pain or severe pain., Disp: 20 tablet, Rfl: 0   TYLENOL 325 MG tablet, Take 325 mg by mouth every 6 (six) hours as needed for  headache or mild pain., Disp: , Rfl:    XARELTO 20 MG TABS tablet, TAKE ONE TABLET ONCE DAILY WITH SUPPER (Patient taking differently: Take 20 mg by mouth in the morning.), Disp: 30 tablet, Rfl: 12  PHYSICAL EXAM: ECOG FS:1 - Symptomatic but completely ambulatory   There were no vitals filed for this visit.  Patient speaking in clear sentences, no respiratory distress    LABORATORY DATA: I have reviewed the data as listed    Latest Ref Rng & Units 09/11/2022   12:28 PM 08/23/2022    8:14 AM 08/20/2022    4:31 AM  CBC  WBC 4.0 - 10.5 K/uL 4.8  4.6    Hemoglobin 12.0 - 15.0 g/dL 09.8  11.9  9.4   Hematocrit 36.0 - 46.0 % 36.1  34.4    Platelets 150 - 400 K/uL 252  258          Latest Ref Rng & Units 09/11/2022   12:28 PM 08/23/2022    8:14 AM 08/18/2022    4:17 AM  CMP  Glucose 70 - 99 mg/dL 98  147  829   BUN 6 - 20 mg/dL 9  15  9    Creatinine 0.44 - 1.00 mg/dL 5.62  1.30  8.65   Sodium 135 - 145 mmol/L 141  140  133   Potassium 3.5 - 5.1 mmol/L 4.1  4.2  4.1   Chloride 98 - 111 mmol/L 108  104  102   CO2 22 - 32 mmol/L 29  31  23    Calcium 8.9 - 10.3 mg/dL 9.5  9.1  8.3   Total Protein 6.5 - 8.1 g/dL 6.9  6.7    Total Bilirubin 0.3 - 1.2 mg/dL 0.4  0.5    Alkaline Phos 38 - 126 U/L 71  86    AST 15 - 41 U/L 18  23    ALT 0 - 44  U/L 18  27         RADIOGRAPHIC STUDIES: I have personally reviewed the radiological images as listed and agreed with the findings in the report. No images are attached to the encounter. No results found.   ASSESSMENT & PLAN: Patient is a 57 y.o. female with oncologic history of malignant neoplasm of upper-outer quadrant of right breast, ER positive followed by Dr. Pamelia Hoit.  #Peripheral neuropathy -Per patient symptoms have improved after exercise and massage that was recommended at last visit. -As symptoms are improving there is no indication for pharmacologic intervention. -I informed oncologist about her symptoms after the Prolia  injection and he recommends that it be discontinued.  -Patient knows to return to clinic if symptoms worsen.  #Malignant neoplasm of upper-outer quadrant of right breast, ER positive -Patient will continue on anastrozole.  Next appointment with oncologist is 08/27/2023   Visit Diagnosis: 1. Malignant neoplasm of upper-outer quadrant of right breast in female, estrogen receptor positive (HCC)   2. Neuropathy      No orders of the defined types were placed in this encounter.   All questions were answered. The patient knows to call the clinic with any problems, questions or concerns. No barriers to learning was detected.  Time spent with patient on telephone encounter: 10 minutes   Thank you for allowing me to participate in the care of this patient.    Shanon Ace, PA-C Department of Hematology/Oncology [facility] Phone: [phone]  [phone]    09/18/2022 4:56 PM

## 2022-10-03 ENCOUNTER — Telehealth: Payer: Self-pay

## 2022-10-03 DIAGNOSIS — C50411 Malignant neoplasm of upper-outer quadrant of right female breast: Secondary | ICD-10-CM

## 2022-10-03 NOTE — Telephone Encounter (Signed)
G9562 - A Prospective Observational Cohort Study to Develop a Predictive Model of Taxane-Induced Peripheral Neuropathy in Cancer Patients    Called the patient to complete 156 assessments. Confirmed the patient identity . Patient completed PROs including EORTC QLQ-CIPN20, PRO-CTCAE, and FACT/GOG-NTX-4 over the phone.  The patient does still have some pain, numbness, and tingling in hands and feet. The patient stated that it was due a treatment that Dr. Pamelia Hoit was already aware of.    The patient denied questions at this time and was given this coordinator's contact information. They were informed that this wast their last follow up visit for the S1714 study.   Felecia Jan, [facility] 10/03/2022 4:36 PM

## 2022-10-10 DIAGNOSIS — M65311 Trigger thumb, right thumb: Secondary | ICD-10-CM | POA: Diagnosis not present

## 2022-10-18 DIAGNOSIS — Z01419 Encounter for gynecological examination (general) (routine) without abnormal findings: Secondary | ICD-10-CM | POA: Diagnosis not present

## 2022-11-05 DIAGNOSIS — M65331 Trigger finger, right middle finger: Secondary | ICD-10-CM | POA: Diagnosis not present

## 2022-11-05 DIAGNOSIS — M65311 Trigger thumb, right thumb: Secondary | ICD-10-CM | POA: Diagnosis not present

## 2022-11-06 ENCOUNTER — Ambulatory Visit (INDEPENDENT_AMBULATORY_CARE_PROVIDER_SITE_OTHER): Payer: BC Managed Care – PPO | Admitting: Family Medicine

## 2022-11-06 VITALS — Wt 122.0 lb

## 2022-11-06 DIAGNOSIS — L719 Rosacea, unspecified: Secondary | ICD-10-CM

## 2022-11-06 MED ORDER — SULFAMETHOXAZOLE-TRIMETHOPRIM 400-80 MG PO TABS
1.0000 | ORAL_TABLET | Freq: Two times a day (BID) | ORAL | 0 refills | Status: AC
Start: 2022-11-06 — End: 2022-11-16

## 2022-11-06 MED ORDER — CLINDAMYCIN PHOS-BENZOYL PEROX 1-5 % EX GEL
Freq: Every day | CUTANEOUS | 0 refills | Status: AC
Start: 2022-11-06 — End: ?

## 2022-11-06 MED ORDER — METRONIDAZOLE 1 % EX GEL: Freq: Every day | CUTANEOUS | 4 refills | Status: DC

## 2022-11-06 NOTE — Progress Notes (Signed)
Subjective: YQ:MVHQ breakout PCP: Raliegh Ip, DO ION:GEXB L Cosson is a 57 y.o. female presenting to clinic today for:  1. Skin breakout She notes that she has had recurrent issues with acneiform infections along her nose and cheeks.  She was previously put on Septra and a topical cream by her dermatologist, who she is going to see in a couple of weeks.  However, the breakout around her nose seems to be worse and she worried about this given her immunocompromise state in the setting of treatment for breast cancer.  She reports that sometimes the lesions can be very tender.   ROS: Per HPI  Allergies  Allergen Reactions   Hyoscyamine Anaphylaxis   Fosamax [Alendronate] Nausea Only and Other (See Comments)    SEVERE NAUSEA   Nexium [Esomeprazole] Swelling and Other (See Comments)    Tongue became swollen- breathing not affected   Doxycycline Palpitations and Other (See Comments)    Racing heart   Past Medical History:  Diagnosis Date   Anemia    Anxiety    Breast cancer (HCC)    Clotting disorder (HCC)    Displacement of breast implant 12/24/2017   Diverticulitis    Diverticulosis    DVT (deep venous thrombosis) (HCC)    left leg knee and ankle    GERD (gastroesophageal reflux disease)    History of kidney stones    Kidney stones    Pneumonia    Pre-diabetes     Current Outpatient Medications:    anastrozole (ARIMIDEX) 1 MG tablet, Take 1 tablet (1 mg total) by mouth at bedtime., Disp: 90 tablet, Rfl: 3   glucose blood test strip, UAD to check sugar. R73.03, Disp: 100 each, Rfl: 12   Lancets Thin MISC, UAD to check sugar. R73.03, Disp: 100 each, Rfl: 12   ondansetron (ZOFRAN) 4 MG tablet, Take 1 tablet (4 mg total) by mouth every 6 (six) hours as needed for nausea or vomiting., Disp: 8 tablet, Rfl: 2   Semaglutide-Weight Management (WEGOVY) 2.4 MG/0.75ML SOAJ, Inject 2.4 mg into the skin every 7 (seven) days. (Patient taking differently: Inject 2.4 mg into the  skin See admin instructions. Inject 2.4 mg into the skin every 7-28 days), Disp: 9 mL, Rfl: 3   TYLENOL 325 MG tablet, Take 325 mg by mouth every 6 (six) hours as needed for headache or mild pain., Disp: , Rfl:    XARELTO 20 MG TABS tablet, TAKE ONE TABLET ONCE DAILY WITH SUPPER (Patient taking differently: Take 20 mg by mouth in the morning.), Disp: 30 tablet, Rfl: 12 Social History   Socioeconomic History   Marital status: Married    Spouse name: Tim   Number of children: 2   Years of education: some college   Highest education level: Some college, no degree  Occupational History   Occupation: [employer] banker, corporate  Tobacco Use   Smoking status: Never   Smokeless tobacco: Never  Vaping Use   Vaping status: Never Used  Substance and Sexual Activity   Alcohol use: Yes    Comment: 1 PER MONTH   Drug use: No   Sexual activity: Yes    Birth control/protection: I.U.D.  Other Topics Concern   Not on file  Social History Narrative   Lives with spouse   Caffeine use: no caffeine    Left handed    Works for an Chartered certified accountant to read fiction   Social Determinants of Corporate investment banker Strain:  Low Risk  (11/06/2022)   Overall Financial Resource Strain (CARDIA)    Difficulty of Paying Living Expenses: Not hard at all  Food Insecurity: No Food Insecurity (11/06/2022)   Hunger Vital Sign    Worried About Running Out of Food in the Last Year: Never true    Ran Out of Food in the Last Year: Never true  Transportation Needs: No Transportation Needs (11/06/2022)   PRAPARE - Administrator, Civil Service (Medical): No    Lack of Transportation (Non-Medical): No  Physical Activity: Insufficiently Active (11/06/2022)   Exercise Vital Sign    Days of Exercise per Week: 3 days    Minutes of Exercise per Session: 30 min  Stress: No Stress Concern Present (11/06/2022)   [employer] of Occupational Health - Occupational Stress Questionnaire    Feeling of Stress : Only a  little  Social Connections: Socially Integrated (11/06/2022)   Social Connection and Isolation Panel [NHANES]    Frequency of Communication with Friends and Family: More than three times a week    Frequency of Social Gatherings with Friends and Family: Once a week    Attends Religious Services: More than 4 times per year    Active Member of [employer] or Organizations: Yes    Attends Engineer, structural: More than 4 times per year    Marital Status: Married  Catering manager Violence: Not At Risk (08/17/2022)   Humiliation, Afraid, Rape, and Kick questionnaire    Fear of Current or Ex-Partner: No    Emotionally Abused: No    Physically Abused: No    Sexually Abused: No   Family History  Problem Relation Age of Onset   Hyperlipidemia Mother    Polymyalgia rheumatica Mother    Hypertension Father    Hyperlipidemia Father    Diabetes type I Brother    Ovarian cancer Maternal Aunt    Diabetes type II Maternal Grandfather    Allergies Daughter    Colon cancer Neg Hx    Esophageal cancer Neg Hx    Rectal cancer Neg Hx    Stomach cancer Neg Hx     Objective: Office vital signs reviewed. Wt 122 lb (55.3 kg)   BMI 20.94 kg/m   Physical Examination:  General: Awake, alert, well nourished, No acute distress HEENT: sclera white, MMM Cardio: regular rate and rhythm, S1S2 heard, no murmurs appreciated Pulm: clear to auscultation bilaterally, no wheezes, rhonchi or rales; normal work of breathing on room air Skin: She has furuncle changes along the nasolabial folds and around the ala of the nose bilaterally  Assessment/ Plan: 57 y.o. female   Rosacea - Plan: metroNIDAZOLE (METROGEL) 1 % gel, sulfamethoxazole-trimethoprim (BACTRIM) 400-80 MG tablet, clindamycin-benzoyl peroxide (BENZACLIN) gel  MetroGel, Bactrim and clindamycin/benzyl peroxide ordered.  Keep follow-up with dermatology.  Follow-up with me in 6 to 12 months for annual physical exam with fasting labs, sooner if  concerns arise  No orders of the defined types were placed in this encounter.  No orders of the defined types were placed in this encounter.    Raliegh Ip, DO [facility] [phone]

## 2022-11-08 ENCOUNTER — Ambulatory Visit: Payer: BC Managed Care – PPO | Admitting: Family Medicine

## 2022-11-09 ENCOUNTER — Other Ambulatory Visit: Payer: Self-pay

## 2022-11-09 ENCOUNTER — Emergency Department (HOSPITAL_BASED_OUTPATIENT_CLINIC_OR_DEPARTMENT_OTHER)
Admission: EM | Admit: 2022-11-09 | Discharge: 2022-11-09 | Disposition: A | Discharge status: Home / Self Care | Payer: BC Managed Care – PPO | Source: Home / Self Care

## 2022-11-09 ENCOUNTER — Encounter (HOSPITAL_BASED_OUTPATIENT_CLINIC_OR_DEPARTMENT_OTHER): Payer: Self-pay | Admitting: Emergency Medicine

## 2022-11-09 ENCOUNTER — Emergency Department (HOSPITAL_BASED_OUTPATIENT_CLINIC_OR_DEPARTMENT_OTHER): Payer: BC Managed Care – PPO

## 2022-11-09 DIAGNOSIS — R110 Nausea: Secondary | ICD-10-CM | POA: Diagnosis not present

## 2022-11-09 DIAGNOSIS — R109 Unspecified abdominal pain: Secondary | ICD-10-CM | POA: Insufficient documentation

## 2022-11-09 DIAGNOSIS — Z7901 Long term (current) use of anticoagulants: Secondary | ICD-10-CM | POA: Insufficient documentation

## 2022-11-09 LAB — URINALYSIS, ROUTINE W REFLEX MICROSCOPIC
Bacteria, UA: NONE SEEN
Bilirubin Urine: NEGATIVE
Glucose, UA: NEGATIVE mg/dL
Ketones, ur: NEGATIVE mg/dL
Leukocytes,Ua: NEGATIVE
Nitrite: NEGATIVE
RBC / HPF: >50 RBC/hpf (ref 0–5)
Specific Gravity, Urine: 1.022 (ref 1.005–1.030)
pH: 6 (ref 5.0–8.0)

## 2022-11-09 LAB — COMPREHENSIVE METABOLIC PANEL
ALT: 13 U/L (ref 0–44)
AST: 14 U/L — ABNORMAL LOW (ref 15–41)
Albumin: 4.2 g/dL (ref 3.5–5.0)
Alkaline Phosphatase: 51 U/L (ref 38–126)
Anion gap: 7 (ref 5–15)
BUN: 13 mg/dL (ref 6–20)
CO2: 30 mmol/L (ref 22–32)
Calcium: 9.4 mg/dL (ref 8.9–10.3)
Chloride: 101 mmol/L (ref 98–111)
Creatinine, Ser: 0.88 mg/dL (ref 0.44–1.00)
GFR, Estimated: >60 mL/min (ref >60)
Glucose, Bld: 120 mg/dL — ABNORMAL HIGH (ref 70–99)
Potassium: 4 mmol/L (ref 3.5–5.1)
Sodium: 138 mmol/L (ref 135–145)
Total Bilirubin: 0.4 mg/dL (ref 0.3–1.2)
Total Protein: 6.5 g/dL (ref 6.5–8.1)

## 2022-11-09 LAB — CBC WITH DIFFERENTIAL/PLATELET
Abs Immature Granulocytes: 0.03 10*3/uL (ref 0.00–0.07)
Basophils Absolute: 0.1 10*3/uL (ref 0.0–0.1)
Basophils Relative: 1 %
Eosinophils Absolute: 0.2 10*3/uL (ref 0.0–0.5)
Eosinophils Relative: 3 %
HCT: 39.2 % (ref 36.0–46.0)
Hemoglobin: 13.2 g/dL (ref 12.0–15.0)
Immature Granulocytes: 1 %
Lymphocytes Relative: 28 %
Lymphs Abs: 1.4 10*3/uL (ref 0.7–4.0)
MCH: 29.6 pg (ref 26.0–34.0)
MCHC: 33.7 g/dL (ref 30.0–36.0)
MCV: 87.9 fL (ref 80.0–100.0)
Monocytes Absolute: 0.5 10*3/uL (ref 0.1–1.0)
Monocytes Relative: 11 %
Neutro Abs: 2.9 10*3/uL (ref 1.7–7.7)
Neutrophils Relative %: 56 %
Platelets: 253 10*3/uL (ref 150–400)
RBC: 4.46 MIL/uL (ref 3.87–5.11)
RDW: 13.2 % (ref 11.5–15.5)
WBC: 5.1 10*3/uL (ref 4.0–10.5)
nRBC: 0 % (ref 0.0–0.2)

## 2022-11-09 MED ORDER — ONDANSETRON 4 MG PO TBDP
4.0000 mg | ORAL_TABLET | Freq: Once | ORAL | Status: AC
  Administered 2022-11-09: 4 mg via ORAL
  Filled 2022-11-09: qty 1

## 2022-11-09 MED ORDER — SODIUM CHLORIDE 0.9 % IV BOLUS
1000.0000 mL | Freq: Once | INTRAVENOUS | Status: AC
  Administered 2022-11-09: 1000 mL via INTRAVENOUS

## 2022-11-09 MED ORDER — KETOROLAC TROMETHAMINE 30 MG/ML IJ SOLN
30.0000 mg | Freq: Once | INTRAMUSCULAR | Status: AC
  Administered 2022-11-09: 30 mg via INTRAVENOUS
  Filled 2022-11-09: qty 1

## 2022-11-09 NOTE — ED Triage Notes (Signed)
Patient c/o left flank pain that started 1 hour ago.  Patient endorses urinary urgency.  Patient has history of kidney stones.

## 2022-11-09 NOTE — Discharge Instructions (Signed)
You are seen in the emergency department today for flank pain.  Thankfully your labs were reassuring and your CT scan did not show any evidence of a kidney stone or any other abnormality to account for your symptoms at this time.  Your urine did show some signs of blood in there but no obvious signs of infection.  I do suspect they may be did have a kidney stone that this may have passed fairly easily on its own.  Given that you have Zofran at home, would recommend taking this for nausea he persists with nausea.  You can take over-the-counter medications for pain such as Tylenol, Aleve, Advil.  If you feel that your symptoms are worsening or begin to experience urinary discomfort such as pain with urination, increased urinary frequency, significant blood in the urine, please return to the emergency department or seek evaluation by a primary care provider or an urgent care.

## 2022-11-09 NOTE — ED Provider Notes (Signed)
[place] EMERGENCY DEPARTMENT AT [facility] Provider Note   CSN: [id number] Arrival date & time: 11/09/22  2006     History Chief Complaint  Patient presents with   Flank Pain    Stephanie Brewer is a 57 y.o. female.  Patient with a past history significant for kidney stone presents to the emergency department complaints of flank pain.  States that she is currently endorsing left-sided flank pain that began about 1 hour prior to arriving in the emergency department.  Did have some associated urinary difficulty as well as nausea and vomiting.  States that the pain is not improved somewhat and the nausea and vomiting of settled down.  Concerned that the pain is significant in the left flank which is typically how she is presented when she has a kidney stone in the past.  Denies any urinary symptoms himself such as dysuria, hematuria, increased urinary frequency or urgency.  No recent fevers, diarrhea, general fatigue or malaise.   Flank Pain       Home Medications Prior to Admission medications   Medication Sig Start Date End Date Taking? Authorizing Provider  anastrozole (ARIMIDEX) 1 MG tablet Take 1 tablet (1 mg total) by mouth at bedtime. 08/23/22   Serena Croissant, MD  clindamycin-benzoyl peroxide (BENZACLIN) gel Apply topically daily. 11/06/22   Delynn Flavin M, DO  glucose blood test strip UAD to check sugar. R73.03 02/14/21   Delynn Flavin M, DO  Lancets Thin MISC UAD to check sugar. R73.03 02/14/21   Delynn Flavin M, DO  metroNIDAZOLE (METROGEL) 1 % gel Apply topically daily. To face 11/06/22   Raliegh Ip, DO  ondansetron (ZOFRAN) 4 MG tablet Take 1 tablet (4 mg total) by mouth every 6 (six) hours as needed for nausea or vomiting. 08/20/22   Karie Soda, MD  Semaglutide-Weight Management (WEGOVY) 2.4 MG/0.75ML SOAJ Inject 2.4 mg into the skin every 7 (seven) days. Patient taking differently: Inject 2.4 mg into the skin See admin instructions. Inject 2.4  mg into the skin every 7-28 days 11/15/21   Raliegh Ip, DO  sulfamethoxazole-trimethoprim (BACTRIM) 400-80 MG tablet Take 1 tablet by mouth 2 (two) times daily for 10 days. 11/06/22 11/16/22  Delynn Flavin M, DO  TYLENOL 325 MG tablet Take 325 mg by mouth every 6 (six) hours as needed for headache or mild pain.    [provider]  XARELTO 20 MG TABS tablet TAKE ONE TABLET ONCE DAILY WITH SUPPER Patient taking differently: Take 20 mg by mouth in the morning. 03/15/22   Serena Croissant, MD      Allergies    Hyoscyamine, Fosamax [alendronate], Nexium [esomeprazole], and Doxycycline    Review of Systems   Review of Systems  Genitourinary:  Positive for flank pain.  All other systems reviewed and are negative.   Physical Exam Updated Vital Signs BP (!) 141/79 (BP Location: Left Arm)   Pulse 78   Temp 98.2 F (36.8 C) (Oral)   Resp 15   Wt 55.3 kg   SpO2 100%   BMI 20.94 kg/m  Physical Exam Vitals and nursing note reviewed.  Constitutional:      General: She is not in acute distress.    Appearance: She is well-developed.  HENT:     Head: Normocephalic and atraumatic.  Eyes:     Conjunctiva/sclera: Conjunctivae normal.  Cardiovascular:     Rate and Rhythm: Normal rate and regular rhythm.     Heart sounds: No murmur heard. Pulmonary:  Effort: Pulmonary effort is normal. No respiratory distress.     Breath sounds: Normal breath sounds.  Abdominal:     General: Abdomen is flat. Bowel sounds are normal.     Palpations: Abdomen is soft.     Tenderness: There is no abdominal tenderness. There is left CVA tenderness. There is no right CVA tenderness.  Musculoskeletal:        General: Tenderness present. No swelling.       Arms:     Cervical back: Neck supple.     Comments: Left flank/CVA tenderness to percussion  Skin:    General: Skin is warm and dry.     Capillary Refill: Capillary refill takes less than 2 seconds.  Neurological:     Mental Status: She  is alert.  Psychiatric:        Mood and Affect: Mood normal.     ED Results / Procedures / Treatments   Labs (all labs ordered are listed, but only abnormal results are displayed) Labs Reviewed  COMPREHENSIVE METABOLIC PANEL - Abnormal; Notable for the following components:      Result Value   Glucose, Bld 120 (*)    AST 14 (*)    All other components within normal limits  URINALYSIS, ROUTINE W REFLEX MICROSCOPIC - Abnormal; Notable for the following components:   APPearance HAZY (*)    Hgb urine dipstick LARGE (*)    Protein, ur TRACE (*)    All other components within normal limits  CBC WITH DIFFERENTIAL/PLATELET    EKG None  Radiology CT Renal Stone Study  Result Date: 11/09/2022 CLINICAL DATA:  Left flank pain, history of kidney stones EXAM: CT ABDOMEN AND PELVIS WITHOUT CONTRAST TECHNIQUE: Multidetector CT imaging of the abdomen and pelvis was performed following the standard protocol without IV contrast. RADIATION DOSE REDUCTION: This exam was performed according to the departmental dose-optimization program which includes automated exposure control, adjustment of the mA and/or kV according to patient size and/or use of iterative reconstruction technique. COMPARISON:  05/23/2022 FINDINGS: Lower chest: Lung bases are clear. Hepatobiliary: Calcified hepatic granuloma. Unenhanced liver is unremarkable. Gallbladder is unremarkable. No intrahepatic or extrahepatic ductal dilatation. Pancreas: Within normal limits. Spleen: Within normal limits but Adrenals/Urinary Tract: Adrenal glands are within normal limits. 4.2 cm posterior right lower pole simple cyst (series 2/image 27), benign (Bosniak I). No follow-up is recommended. No renal, ureteral, or bladder calculi.  No hydronephrosis. Bladder is within normal limits. Stomach/Bowel: Stomach is within normal limits. No evidence of bowel obstruction. Normal appendix (series 2/image 58). Prior left hemicolectomy with suture line in the left  lower pelvis (series 2/image 59). Vascular/Lymphatic: No evidence of abdominal aortic aneurysm. No suspicious abdominopelvic lymphadenopathy. Reproductive: Uterus is normal limits. Bilateral ovaries are within normal limits. Other: No abdominopelvic ascites. Musculoskeletal: Visualized osseous structures are within normal limits. IMPRESSION: No renal, ureteral, or bladder calculi.  No hydronephrosis. No CT findings to account for the patient's left flank pain. Prior left hemicolectomy. Electronically Signed   By: Charline Bills M.D.   On: 11/09/2022 21:41    Procedures Procedures   Medications Ordered in ED Medications  ondansetron (ZOFRAN-ODT) disintegrating tablet 4 mg (4 mg Oral Given 11/09/22 2017)  ketorolac (TORADOL) 30 MG/ML injection 30 mg (30 mg Intravenous Given 11/09/22 2142)  sodium chloride 0.9 % bolus 1,000 mL (1,000 mLs Intravenous New Bag/Given 11/09/22 2147)    ED Course/ Medical Decision Making/ A&P  Medical Decision Making Amount and/or Complexity of Data Reviewed Labs: ordered. Radiology: ordered.  Risk Prescription drug management.   This patient presents to the ED for concern of flank pain.  Differential diagnosis includes urolithiasis, pyelonephritis, UTI, bowel obstruction, appendicitis   Lab Tests:  I Ordered, and personally interpreted labs.  The pertinent results include: CBC unremarkable, CMP without any acute abnormalities, UA with large blood and trace protein but no other signs of infection   Imaging Studies ordered:  I ordered imaging studies including CT renal stone study I independently visualized and interpreted imaging which showed no evidence of any kidney stone or other acute abdominal pathology I agree with the radiologist interpretation   Medicines ordered and prescription drug management:  I ordered medication including Zofran, Toradol, fluids for nausea, pain Reevaluation of the patient after these medicines  showed that the patient improved I have reviewed the patients home medicines and have made adjustments as needed   Problem List / ED Course:  Patient presents to the emergency department complaints of left-sided flank pain.  Reports this began about an hour prior to arriving at the emergency department.  Prior history of kidney stone and reports that this currently feels similar to prior kidney stone.  Denies seeing any kidney stone pass in the urine.  Given presentation and physical exam findings positive for left CVA tenderness, will workup with basic labs and CT renal stone study for further evaluation.  Fluids, Toradol, Zofran ordered for symptomatic control. Labs largely reassuring without any acute injury to kidneys noted in UA shows large blood present but no obvious signs of infection at this time.  CT renal pending. CT renal stone study thankfully reassuring without any signs of kidney stone present or other acute abnormality to account for patient's symptoms.  Given collection of symptoms including left CVA tenderness, hematuria, nausea, vomiting, I believe it is very likely that patient did have a kidney stone but she may have passed it without realizing it.  Patient reports that pain is now largely subsided and nausea is under control.  CT imaging is negative for any acute findings such as a stone given that urine is largely clean without evidence of infection, do not believe that this is an infected stone.  Encourage patient to continue managing symptoms at home with over-the-counter medication such as Tylenol, ibuprofen, Aleve and Zofran for nausea which she has from prior surgery.  Encourage patient to return to the emergency department if symptoms are worsening or seek further evaluation if she begins to develop any urinary symptoms such as dysuria, increased urinary frequency or urgency, or urinary retention.  Patient is agreeable to treatment plan and verbalized understanding of return  precautions.  All questions answered prior to patient discharge.  Final Clinical Impression(s) / ED Diagnoses Final diagnoses:  Left flank pain  Nausea    Rx / DC Orders ED Discharge Orders     None         Smitty Knudsen, PA-C 11/09/22 2229    Coral Spikes, DO 11/10/22 0023

## 2022-11-14 ENCOUNTER — Ambulatory Visit: Payer: BC Managed Care – PPO | Admitting: Family Medicine

## 2022-11-14 DIAGNOSIS — H5213 Myopia, bilateral: Secondary | ICD-10-CM | POA: Diagnosis not present

## 2022-12-13 DIAGNOSIS — L820 Inflamed seborrheic keratosis: Secondary | ICD-10-CM | POA: Diagnosis not present

## 2022-12-13 DIAGNOSIS — L718 Other rosacea: Secondary | ICD-10-CM | POA: Diagnosis not present

## 2022-12-17 DIAGNOSIS — M65331 Trigger finger, right middle finger: Secondary | ICD-10-CM | POA: Diagnosis not present

## 2022-12-24 ENCOUNTER — Ambulatory Visit: Payer: BC Managed Care – PPO | Attending: Hematology and Oncology

## 2022-12-24 VITALS — Wt 127.0 lb

## 2022-12-24 DIAGNOSIS — Z483 Aftercare following surgery for neoplasm: Secondary | ICD-10-CM | POA: Insufficient documentation

## 2022-12-24 NOTE — Therapy (Signed)
OUTPATIENT PHYSICAL THERAPY SOZO SCREENING NOTE   Patient Name: Stephanie Brewer MRN: [id number] DOB:[birth year], 57 y.o., female Today's Date: 12/24/2022  PCP: Raliegh Ip, DO REFERRING PROVIDER: Serena Croissant, MD   PT End of Session - 12/24/22 [phone]     Visit Number 2   # unchanged due to screen only   PT Start Time 0832    PT Stop Time 0836    PT Time Calculation (min) 4 min    Activity Tolerance Patient tolerated treatment well    Behavior During Therapy [facility] for tasks assessed/performed             Past Medical History:  Diagnosis Date   Anemia    Anxiety    Breast cancer (HCC)    Clotting disorder (HCC)    Displacement of breast implant 12/24/2017   Diverticulitis    Diverticulosis    DVT (deep venous thrombosis) (HCC)    left leg knee and ankle    GERD (gastroesophageal reflux disease)    History of kidney stones    Kidney stones    Pneumonia    Pre-diabetes    Past Surgical History:  Procedure Laterality Date   BREAST BIOPSY Left 08/15/2021   BREAST ENHANCEMENT SURGERY     Augmentation 2003   BREAST IMPLANT REMOVAL Bilateral 01/14/2020   Procedure: REMOVAL BREAST IMPLANTS;  Surgeon: Allena Napoleon, MD;  Location: [facility];  Service: Plastics;  Laterality: Bilateral;   BREAST LUMPECTOMY Right 01/2020   BREAST LUMPECTOMY WITH RADIOACTIVE SEED AND SENTINEL LYMPH NODE BIOPSY Right 01/14/2020   Procedure: Right breast seed localized lumpectomy with sentinel lymph node biopsy;  Surgeon: Emelia Loron, MD;  Location: [facility];  Service: General;  Laterality: Right;   BUNIONECTOMY Bilateral 2005   Right foot in 2006   LIPOSUCTION WITH LIPOFILLING Bilateral 11/29/2020   Procedure: LIPOSUCTION WITH LIPOFILLING FROM ABDOMEN TO BILATERAL BREASTS;  Surgeon: Allena Napoleon, MD;  Location: [facility];  Service: Plastics;  Laterality: Bilateral;   MASTOPEXY Left 11/29/2020   Procedure: LEFT MASTOPEXY;   Surgeon: Allena Napoleon, MD;  Location: [facility];  Service: Plastics;  Laterality: Left;   PORTACATH PLACEMENT N/A 09/01/2019   Procedure: INSERTION PORT-A-CATH WITH ULTRASOUND GUIDANCE;  Surgeon: Emelia Loron, MD;  Location: [facility];  Service: General;  Laterality: N/A;   PROCTOSCOPY N/A 08/17/2022   Procedure: RIGID PROCTOSCOPY;  Surgeon: Karie Soda, MD;  Location: [facility];  Service: General;  Laterality: N/A;   TRIGGER FINGER RELEASE Left 05/09/2020   Procedure: RELEASE TRIGGER FINGER/A-1 PULLEY LEFT LONG;  Surgeon: Betha Loa, MD;  Location: [facility];  Service: Orthopedics;  Laterality: Left;   UMBILICAL HERNIA REPAIR  2002   Patient Active Problem List   Diagnosis Date Noted   Chronic nausea 08/20/2022   Sigmoid diverticulitis 08/17/2022   Chronic anticoagulation 07/16/2022   History of breast cancer 07/16/2022   Hypokalemia 05/27/2022   Normocytic anemia 05/26/2022   History of recurrent deep vein thrombosis (DVT) 05/26/2022   Pre-diabetes 05/23/2022   Pure hypercholesterolemia 05/23/2022   Osteoporosis of femur without pathological fracture 11/15/2021   Overweight (BMI 25.0-29.9) 08/15/2021   Port-A-Cath in place 09/09/2019   Malignant neoplasm of upper-outer quadrant of right breast in female, estrogen receptor positive (HCC) 08/20/2019   Breast cancer metastasized to axillary lymph node, right (HCC) 08/18/2019   Trigger middle finger of left hand 02/10/2019   Radicular leg pain 02/04/2018  H/O bilateral breast implants 12/24/2017   GERD (gastroesophageal reflux disease) 12/06/2015   Gluten intolerance 12/06/2015    REFERRING DIAG: right breast cancer at risk for lymphedema  THERAPY DIAG: Aftercare following surgery for neoplasm  PERTINENT HISTORY: R breast cancer, grade 2 invasive ductal carcinoma, ER +, PR-, HER2+, beginning chemo next week 09/02/19, 01/15/20- R lumpectomy and SLNB (4 nodes all negative), will  begin radiation 02/15/20,  hx of LLE DVT following foot surgery for bunion, acute DVT in LUE and in LLE diagnosed on 12/16/20   PRECAUTIONS: right UE Lymphedema risk, None  SUBJECTIVE: Pt returns for her 6 month L-Dex screen. "I had to have a colon resectomy and they removed some lymph nodes."  PAIN:  Are you having pain? No  SOZO SCREENING: Patient was assessed today using the SOZO machine to determine the lymphedema index score. This was compared to her baseline score. It was determined that she is within the recommended range when compared to her baseline and no further action is needed at this time. She will continue SOZO screenings. These are done every 3 months for 2 years post operatively followed by every 6 months for 2 years, and then annually.   L-DEX FLOWSHEETS - 12/24/22 0800       L-DEX LYMPHEDEMA SCREENING   Measurement Type Unilateral    L-DEX MEASUREMENT EXTREMITY Upper Extremity    POSITION  Standing    DOMINANT SIDE Left    At Risk Side Right    BASELINE SCORE (UNILATERAL) 1.4    L-DEX SCORE (UNILATERAL) 0.9    VALUE CHANGE (UNILAT) -0.5              Hermenia Bers, PTA 12/24/2022, 8:37 AM

## 2022-12-31 ENCOUNTER — Ambulatory Visit (INDEPENDENT_AMBULATORY_CARE_PROVIDER_SITE_OTHER): Payer: BC Managed Care – PPO | Admitting: Family Medicine

## 2022-12-31 ENCOUNTER — Encounter: Payer: Self-pay | Admitting: Family Medicine

## 2022-12-31 VITALS — BP 103/68 | HR 77 | Temp 97.9°F | Ht 64.0 in | Wt 126.1 lb

## 2022-12-31 DIAGNOSIS — J0140 Acute pansinusitis, unspecified: Secondary | ICD-10-CM

## 2022-12-31 MED ORDER — AMOXICILLIN-POT CLAVULANATE 875-125 MG PO TABS
1.0000 | ORAL_TABLET | Freq: Two times a day (BID) | ORAL | 0 refills | Status: AC
Start: 2022-12-31 — End: 2023-01-07

## 2022-12-31 NOTE — Progress Notes (Signed)
Acute Office Visit  Subjective:     Patient ID: Stephanie Brewer, female    DOB: 1965-08-10, 57 y.o.   MRN: [id number]  Chief Complaint  Patient presents with   Sinusitis    Sinusitis This is a new problem. The current episode started 1 to 4 weeks ago. Progression since onset: worsening of facial pace and sinus pressure. There has been no fever. Associated symptoms include congestion, coughing (improving), ear pain, headaches, sinus pressure and a sore throat (resolved). Pertinent negatives include no chills, neck pain or shortness of breath. Past treatments include nasal decongestants, acetaminophen and oral decongestants. The treatment provided mild relief.   Hx of sinus infections.   Review of Systems  Constitutional:  Negative for chills.  HENT:  Positive for congestion, ear pain, sinus pressure and sore throat (resolved).   Respiratory:  Positive for cough (improving). Negative for shortness of breath.   Musculoskeletal:  Negative for neck pain.  Neurological:  Positive for headaches.        Objective:    BP 103/68   Pulse 77   Temp 97.9 F (36.6 C) (Temporal)   Ht 5\' 4"  (1.626 m)   Wt 126 lb 2 oz (57.2 kg)   SpO2 99%   BMI 21.65 kg/m    Physical Exam Vitals and nursing note reviewed.  Constitutional:      General: She is not in acute distress.    Appearance: She is not ill-appearing, toxic-appearing or diaphoretic.  HENT:     Head: Normocephalic and atraumatic.     Right Ear: Ear canal and external ear normal. A middle ear effusion is present. Tympanic membrane is not perforated, erythematous, retracted or bulging.     Left Ear: Ear canal and external ear normal. A middle ear effusion is present. Tympanic membrane is not perforated, erythematous, retracted or bulging.     Nose: Congestion present.     Right Sinus: Maxillary sinus tenderness and frontal sinus tenderness present.     Left Sinus: Maxillary sinus tenderness and frontal sinus tenderness present.      Mouth/Throat:     Mouth: Mucous membranes are moist.     Pharynx: Oropharynx is clear. No oropharyngeal exudate or posterior oropharyngeal erythema.  Eyes:     General:        Right eye: No discharge.        Left eye: No discharge.     Conjunctiva/sclera: Conjunctivae normal.  Cardiovascular:     Rate and Rhythm: Normal rate and regular rhythm.     Heart sounds: Normal heart sounds. No murmur heard. Pulmonary:     Effort: Pulmonary effort is normal. No respiratory distress.     Breath sounds: Normal breath sounds. No wheezing or rhonchi.  Musculoskeletal:     Cervical back: Neck supple. No rigidity.     Right lower leg: No edema.     Left lower leg: No edema.  Lymphadenopathy:     Cervical: No cervical adenopathy.  Skin:    General: Skin is warm and dry.  Neurological:     General: No focal deficit present.     Mental Status: She is alert and oriented to person, place, and time.  Psychiatric:        Mood and Affect: Mood normal.        Behavior: Behavior normal.     No results found for any visits on 12/31/22.      Assessment & Plan:   Jaquise was seen today for  sinusitis.  Diagnoses and all orders for this visit:  Acute non-recurrent pansinusitis Discussed symptomatic care and return precautions. -     amoxicillin-clavulanate (AUGMENTIN) 875-125 MG tablet; Take 1 tablet by mouth 2 (two) times daily for 7 days.   Return to office for new or worsening symptoms, or if symptoms persist.   The patient indicates understanding of these issues and agrees with the plan.  Gabriel Earing, FNP

## 2023-01-10 DIAGNOSIS — Z1283 Encounter for screening for malignant neoplasm of skin: Secondary | ICD-10-CM | POA: Diagnosis not present

## 2023-01-10 DIAGNOSIS — L718 Other rosacea: Secondary | ICD-10-CM | POA: Diagnosis not present

## 2023-01-10 DIAGNOSIS — D225 Melanocytic nevi of trunk: Secondary | ICD-10-CM | POA: Diagnosis not present

## 2023-01-11 ENCOUNTER — Ambulatory Visit: Payer: BC Managed Care – PPO | Admitting: Family Medicine

## 2023-01-11 ENCOUNTER — Ambulatory Visit (INDEPENDENT_AMBULATORY_CARE_PROVIDER_SITE_OTHER): Payer: BC Managed Care – PPO

## 2023-01-11 VITALS — BP 115/75 | HR 72 | Temp 98.3°F | Ht 64.0 in | Wt 128.5 lb

## 2023-01-11 DIAGNOSIS — M5441 Lumbago with sciatica, right side: Secondary | ICD-10-CM

## 2023-01-11 DIAGNOSIS — M25551 Pain in right hip: Secondary | ICD-10-CM

## 2023-01-11 DIAGNOSIS — R29898 Other symptoms and signs involving the musculoskeletal system: Secondary | ICD-10-CM

## 2023-01-11 DIAGNOSIS — M4185 Other forms of scoliosis, thoracolumbar region: Secondary | ICD-10-CM | POA: Diagnosis not present

## 2023-01-11 DIAGNOSIS — R531 Weakness: Secondary | ICD-10-CM | POA: Diagnosis not present

## 2023-01-11 DIAGNOSIS — M5416 Radiculopathy, lumbar region: Secondary | ICD-10-CM

## 2023-01-11 DIAGNOSIS — I878 Other specified disorders of veins: Secondary | ICD-10-CM | POA: Diagnosis not present

## 2023-01-11 DIAGNOSIS — M5136 Other intervertebral disc degeneration, lumbar region with discogenic back pain only: Secondary | ICD-10-CM | POA: Diagnosis not present

## 2023-01-11 MED ORDER — OXYCODONE-ACETAMINOPHEN 5-325 MG PO TABS
1.0000 | ORAL_TABLET | Freq: Four times a day (QID) | ORAL | 0 refills | Status: DC | PRN
Start: 2023-01-11 — End: 2023-01-22

## 2023-01-11 MED ORDER — PREDNISONE 20 MG PO TABS
40.0000 mg | ORAL_TABLET | Freq: Every day | ORAL | 0 refills | Status: AC
Start: 2023-01-11 — End: 2023-01-16

## 2023-01-11 MED ORDER — KETOROLAC TROMETHAMINE 60 MG/2ML IM SOLN
60.0000 mg | Freq: Once | INTRAMUSCULAR | Status: AC
Start: 2023-01-11 — End: 2023-01-11
  Administered 2023-01-11: 60 mg via INTRAMUSCULAR

## 2023-01-11 MED ORDER — METHYLPREDNISOLONE ACETATE 80 MG/ML IJ SUSP
80.0000 mg | Freq: Once | INTRAMUSCULAR | Status: AC
Start: 2023-01-11 — End: 2023-01-11
  Administered 2023-01-11: 80 mg via INTRAMUSCULAR

## 2023-01-11 NOTE — Progress Notes (Signed)
Acute Office Visit  Subjective:     Patient ID: Stephanie Brewer, female    DOB: [birth year], 57 y.o.   MRN: [id number]  Chief Complaint  Patient presents with   Hip Pain    Back Pain This is a new problem. Episode onset: 5 days. The problem occurs constantly. The problem has been gradually worsening since onset. The pain is present in the lumbar spine. The quality of the pain is described as aching. The pain radiates to the right thigh (left hip, left groin). The pain is at a severity of 8/10. The symptoms are aggravated by sitting, standing, twisting, bending and lying down. Associated symptoms include leg pain and weakness (left leg). Pertinent negatives include no abdominal pain, bladder incontinence, bowel incontinence, dysuria, fever, numbness, paresis, paresthesias, pelvic pain, perianal numbness, tingling or weight loss. (Right hip pain) Risk factors include history of cancer and history of osteoporosis. She has tried NSAIDs, heat, bed rest, ice, analgesics, chiropractic manipulation and walking (tylenol, tramadol, stretches, biofreeze) for the symptoms. The treatment provided no relief.   Per MR spine mets screening report from 2022: "Mild disc space narrowing at L2-3 and L3-4. Disc bulging from L2-3 to L5-S1. Multilevel facet hypertrophy, moderate at L4-5. No evidence of significant spinal stenosis or compressive neural foraminal stenosis."  Hx of osteoporosis. She is able to bear weight with pain. Difficulty lifting her leg and rotating her hip outward. Using her hands to move her leg for this.   Review of Systems  Constitutional:  Negative for fever and weight loss.  Gastrointestinal:  Negative for abdominal pain and bowel incontinence.  Genitourinary:  Negative for bladder incontinence, dysuria and pelvic pain.  Musculoskeletal:  Positive for back pain.  Neurological:  Positive for weakness (left leg). Negative for tingling, numbness and paresthesias.        Objective:     BP 115/75   Pulse 72   Temp 98.3 F (36.8 C) (Temporal)   Ht 5\' 4"  (1.626 m)   Wt 128 lb 8 oz (58.3 kg)   SpO2 100%   BMI 22.06 kg/m    Physical Exam Vitals and nursing note reviewed.  Constitutional:      General: She is not in acute distress.    Appearance: She is not ill-appearing, toxic-appearing or diaphoretic.  Pulmonary:     Effort: Pulmonary effort is normal. No respiratory distress.  Musculoskeletal:        General: No swelling or deformity.     Lumbar back: Tenderness (right paraspinal) present. No swelling, deformity, spasms or bony tenderness. Normal range of motion. Positive right straight leg raise test.     Right hip: No tenderness, bony tenderness or crepitus. Decreased strength.     Right upper leg: No swelling, edema, deformity, lacerations, tenderness or bony tenderness.     Right lower leg: No edema.     Left lower leg: No edema.  Skin:    General: Skin is warm and dry.  Neurological:     Mental Status: She is alert and oriented to person, place, and time.     Cranial Nerves: No cranial nerve deficit.     Sensory: No sensory deficit.     Motor: Weakness (strength 4/5 of LLE) present.     Coordination: Coordination normal.     Gait: Gait abnormal (antalgic gait).  Psychiatric:        Mood and Affect: Mood normal.        Behavior: Behavior normal.  No results found for any visits on 01/11/23.      Assessment & Plan:   Stephanie Brewer was seen today for hip pain.  Diagnoses and all orders for this visit:  Lumbar radiculopathy, acute -     DG Lumbar Spine 2-3 Views; Future -     Ambulatory referral to Neurosurgery -     ketorolac (TORADOL) injection 60 mg -     methylPREDNISolone acetate (DEPO-MEDROL) injection 80 mg -     predniSONE (DELTASONE) 20 MG tablet; Take 2 tablets (40 mg total) by mouth daily with breakfast for 5 days. -     oxyCODONE-acetaminophen (PERCOCET/ROXICET) 5-325 MG tablet; Take 1 tablet by mouth every 6 (six) hours as needed for  up to 5 days for severe pain.  Right hip pain -     DG Lumbar Spine 2-3 Views; Future -     DG Hip Unilat W OR W/O Pelvis 2-3 Views Right; Future  Left leg weakness -     Ambulatory referral to Neurosurgery   No acute findings on xray of hip today. Bone spurring noted of lumbar spine. Radiology reports are pending. Discussed symptoms consistent with lumbar radiculopathy. She has known hx of lumbar disc bulging. Symptoms are worsening and she is experiencing some weakness of her left leg. No saddle anesthesia or changes in bowel or bladder control. Discussed case with patient's PCP who agreed with steroid IM, prednisone burst, toradol IM, urgent referral to neurosurgery. PCP also agreed with percocet for severe pain prn. PDMP reviewed and no red flags. Patient aware not to start prednisone burst until tomorrow and not take take other NSAIDs with toradol injection today. Can resume NSAIDs after 24-48 hours. She is aware of when to seek emergency care. Will notify patient of radiology reports when available.   Return if symptoms worsen or fail to improve.  The patient indicates understanding of these issues and agrees with the plan.  Gabriel Earing, FNP

## 2023-01-11 NOTE — Progress Notes (Signed)
Referring Physician:  Gabriel Earing, FNP [address] [place],  [place] [address]  Primary Physician:  Raliegh Ip, DO  History of Present Illness: 01/15/2023 [name] has a history of osteoporosis, GERD, breast CA, history of DVT.   History of chronic LBP x years with intermittent flare ups.   She has 10 day history of constant LBP with right groin pain that is worse with sitting, bending, and twisting. She has pain getting in/out of a car. Pain has gotten better since Friday. No injury noted. No left leg pain. No numbness, tingling, or weakness in her right leg.   PCP saw her on 01/11/23 and did toradol injection, local steroid injection, prednisone, and oxycodone.   She has one more day of prednisone. She is taking prn oxycodone.   She is on XARELTO.   Bowel/Bladder Dysfunction: none  Conservative measures: seen a chiropractor  Physical therapy: has not participated in  Multimodal medical therapy including regular antiinflammatories: toradol injection, prednisone, oxycodone  Injections: has not received epidural steroid injections  Past Surgery: no previous spinal surgeries  Stephanie Brewer has no symptoms of cervical myelopathy.  The symptoms are causing a significant impact on the patient's life.   Review of Systems:  A 10 point review of systems is negative, except for the pertinent positives and negatives detailed in the HPI.  Past Medical History: Past Medical History:  Diagnosis Date   Anemia    Anxiety    Breast cancer (HCC)    Clotting disorder (HCC)    Displacement of breast implant 12/24/2017   Diverticulitis    Diverticulosis    DVT (deep venous thrombosis) (HCC)    left leg knee and ankle    GERD (gastroesophageal reflux disease)    History of kidney stones    Kidney stones    Pneumonia    Pre-diabetes     Past Surgical History: Past Surgical History:  Procedure Laterality Date   BREAST BIOPSY Left 08/15/2021   BREAST  ENHANCEMENT SURGERY     Augmentation 2003   BREAST IMPLANT REMOVAL Bilateral 01/14/2020   Procedure: REMOVAL BREAST IMPLANTS;  Surgeon: Allena Napoleon, MD;  Location: [facility];  Service: Plastics;  Laterality: Bilateral;   BREAST LUMPECTOMY Right 01/2020   BREAST LUMPECTOMY WITH RADIOACTIVE SEED AND SENTINEL LYMPH NODE BIOPSY Right 01/14/2020   Procedure: Right breast seed localized lumpectomy with sentinel lymph node biopsy;  Surgeon: Emelia Loron, MD;  Location: [facility];  Service: General;  Laterality: Right;   BUNIONECTOMY Bilateral 2005   Right foot in 2006   LIPOSUCTION WITH LIPOFILLING Bilateral 11/29/2020   Procedure: LIPOSUCTION WITH LIPOFILLING FROM ABDOMEN TO BILATERAL BREASTS;  Surgeon: Allena Napoleon, MD;  Location: [facility];  Service: Plastics;  Laterality: Bilateral;   MASTOPEXY Left 11/29/2020   Procedure: LEFT MASTOPEXY;  Surgeon: Allena Napoleon, MD;  Location: [facility];  Service: Plastics;  Laterality: Left;   PORTACATH PLACEMENT N/A 09/01/2019   Procedure: INSERTION PORT-A-CATH WITH ULTRASOUND GUIDANCE;  Surgeon: Emelia Loron, MD;  Location: [facility];  Service: General;  Laterality: N/A;   PROCTOSCOPY N/A 08/17/2022   Procedure: RIGID PROCTOSCOPY;  Surgeon: Karie Soda, MD;  Location: [facility];  Service: General;  Laterality: N/A;   TRIGGER FINGER RELEASE Left 05/09/2020   Procedure: RELEASE TRIGGER FINGER/A-1 PULLEY LEFT LONG;  Surgeon: Betha Loa, MD;  Location: [facility];  Service: Orthopedics;  Laterality: Left;  UMBILICAL HERNIA REPAIR  2002    Allergies: Allergies as of 01/15/2023 - Review Complete 01/15/2023  Allergen Reaction Noted   Hyoscyamine Anaphylaxis 01/15/2017   Fosamax [alendronate] Nausea Only and Other (See Comments) 05/23/2022   Nexium [esomeprazole] Swelling and Other (See Comments) 12/06/2015   Doxycycline Palpitations and Other  (See Comments) 12/06/2015    Medications: Outpatient Encounter Medications as of 01/15/2023  Medication Sig   anastrozole (ARIMIDEX) 1 MG tablet Take 1 tablet (1 mg total) by mouth at bedtime.   glucose blood test strip UAD to check sugar. R73.03   Lancets Thin MISC UAD to check sugar. R73.03   metroNIDAZOLE (METROGEL) 1 % gel Apply topically daily. To face   minocycline (MINOCIN) 100 MG capsule Take 100 mg by mouth daily.   ondansetron (ZOFRAN) 4 MG tablet Take 1 tablet (4 mg total) by mouth every 6 (six) hours as needed for nausea or vomiting.   oxyCODONE-acetaminophen (PERCOCET/ROXICET) 5-325 MG tablet Take 1 tablet by mouth every 6 (six) hours as needed for up to 5 days for severe pain.   predniSONE (DELTASONE) 20 MG tablet Take 2 tablets (40 mg total) by mouth daily with breakfast for 5 days.   TYLENOL 325 MG tablet Take 325 mg by mouth every 6 (six) hours as needed for headache or mild pain.   XARELTO 20 MG TABS tablet TAKE ONE TABLET ONCE DAILY WITH SUPPER (Patient taking differently: Take 20 mg by mouth in the morning.)   No facility-administered encounter medications on file as of 01/15/2023.    Social History: Social History   Tobacco Use   Smoking status: Never   Smokeless tobacco: Never  Vaping Use   Vaping status: Never Used  Substance Use Topics   Alcohol use: Yes    Comment: 1 PER MONTH   Drug use: No    Family Medical History: Family History  Problem Relation Age of Onset   Hyperlipidemia Mother    Polymyalgia rheumatica Mother    Hypertension Father    Hyperlipidemia Father    Diabetes type I Brother    Ovarian cancer Maternal Aunt    Diabetes type II Maternal Grandfather    Allergies Daughter    Colon cancer Neg Hx    Esophageal cancer Neg Hx    Rectal cancer Neg Hx    Stomach cancer Neg Hx     Physical Examination: Vitals:   01/15/23 1100  BP: 128/80    General: Patient is well developed, well nourished, calm, collected, and in no apparent  distress. Attention to examination is appropriate.  Respiratory: Patient is breathing without any difficulty.   NEUROLOGICAL:     Awake, alert, oriented to person, place, and time.  Speech is clear and fluent. Fund of knowledge is appropriate.   Cranial Nerves: Pupils equal round and reactive to light.  Facial tone is symmetric.    Mild lower lumbar tenderness.   No abnormal lesions on exposed skin.   Strength: Side Biceps Triceps Deltoid Interossei Grip Wrist Ext. Wrist Flex.  R 5 5 5 5 5 5 5   L 5 5 5 5 5 5 5    Side Iliopsoas Quads Hamstring PF DF EHL  R 5 5 5 5 5 5   L 5 5 5 5 5 5    Reflexes are 2+ and symmetric at the biceps, brachioradialis, patella and achilles.   Hoffman's is absent.  Clonus is not present.   Bilateral upper and lower extremity sensation is intact to light touch.  She has mild LBP with IR/ER of left hip, no groin pain.  No pain with IR/ER of right hip.     Gait is normal.     Medical Decision Making  Imaging: Lumbar xrays dated 01/11/23:  FINDINGS: There are 5 non rib-bearing lumbar type vertebral bodies.   Mild scoliotic curvature of the thoracolumbar spine with caudal component convex to the right measuring approximately 10 degrees (as measured from the superior endplate of T12 to the inferior endplate of L4). No anterolisthesis or retrolisthesis.   Lumbar vertebral body heights appear preserved.   Mild multilevel lumbar spine DDD, most conspicuous about the central concavity of the scoliotic curvature.   Limited visualization of the bilateral SI joints and hips is normal.   Enteric suture line overlies the midline of the pelvis. Regional soft tissues appear otherwise normal.   IMPRESSION: 1. No acute findings. 2. Mild multilevel lumbar spine DDD, most conspicuous about the central concavity of the scoliotic curvature.     Electronically Signed   By: Simonne Come M.D.   On: 01/11/2023 13:05    I have personally reviewed the  images and agree with the above interpretation.  Assessment and Plan: Ms. Rizo is a pleasant 57 y.o. female who has a history of chronic LBP x years with intermittent flare ups.   Now with a 10 day history of constant LBP with right groin pain that is worse with sitting, bending, and twisting. No left leg pain. No numbness, tingling, or weakness in her right leg.   She has known lumbar spondylosis with minimal curve. Symptoms appear to be lumbar mediated. No groin pain with IR/ER of her right hip.   Treatment options discussed with patient and following plan made:   - MRI of lumbar spine ordered to evaluate right lumbar radiculopathy. She has h/o breast CA as above.  - Finish out prednisone from PCP. Continue prn oxycodone. Of note, she is on XARELTO.  - Depending on MRI results, may consider PT and/or injections.  - Recommend she stop chiropractic care for now. Given letter.  - Will schedule follow up visit to review MRI results once I get them back.   I spent a total of 35 minutes in face-to-face and non-face-to-face activities related to this patient's care today including review of outside records, review of imaging, review of symptoms, physical exam, discussion of differential diagnosis, discussion of treatment options, and documentation.   Thank you for involving me in the care of this patient.   Drake Leach PA-C Dept. of Neurosurgery

## 2023-01-14 ENCOUNTER — Ambulatory Visit: Payer: BC Managed Care – PPO

## 2023-01-15 ENCOUNTER — Encounter: Payer: Self-pay | Admitting: Orthopedic Surgery

## 2023-01-15 ENCOUNTER — Encounter: Payer: Self-pay | Admitting: Hematology and Oncology

## 2023-01-15 ENCOUNTER — Ambulatory Visit (INDEPENDENT_AMBULATORY_CARE_PROVIDER_SITE_OTHER): Payer: BC Managed Care – PPO | Admitting: Orthopedic Surgery

## 2023-01-15 VITALS — BP 128/80 | Ht 64.0 in | Wt 127.6 lb

## 2023-01-15 DIAGNOSIS — M5416 Radiculopathy, lumbar region: Secondary | ICD-10-CM

## 2023-01-15 DIAGNOSIS — M47816 Spondylosis without myelopathy or radiculopathy, lumbar region: Secondary | ICD-10-CM

## 2023-01-15 DIAGNOSIS — M4726 Other spondylosis with radiculopathy, lumbar region: Secondary | ICD-10-CM

## 2023-01-15 NOTE — Patient Instructions (Signed)
It was so nice to see you today. Thank you so much for coming in.    You have some wear and tear (arthritis) in your back. I think your back and right hip pain are coming from your back.   I want to get an MRI of your lower back to look into things further. We will get this approved through your insurance and [facility] will call you to schedule the appointment.   After you have the MRI, it takes 10-14 days for me to get the results back. Once I have them, we will call you to schedule a follow up visit with me to review them.   I gave you a letter for your chiropractor. Hold off on further treatments for now.   Please do not hesitate to call if you have any questions or concerns. You can also message me in MyChart.   If you have not heard back about the MRI in the next week, please call the office so we can help you get it scheduled.   Drake Leach PA-C [phone]

## 2023-01-15 NOTE — Telephone Encounter (Signed)
TC

## 2023-01-21 ENCOUNTER — Ambulatory Visit (HOSPITAL_COMMUNITY)
Admission: RE | Admit: 2023-01-21 | Discharge: 2023-01-21 | Disposition: A | Discharge status: Home / Self Care | Payer: BC Managed Care – PPO | Source: Ambulatory Visit | Attending: Orthopedic Surgery | Admitting: Orthopedic Surgery

## 2023-01-21 DIAGNOSIS — M5117 Intervertebral disc disorders with radiculopathy, lumbosacral region: Secondary | ICD-10-CM | POA: Diagnosis not present

## 2023-01-21 DIAGNOSIS — M5416 Radiculopathy, lumbar region: Secondary | ICD-10-CM | POA: Diagnosis not present

## 2023-01-21 DIAGNOSIS — M47816 Spondylosis without myelopathy or radiculopathy, lumbar region: Secondary | ICD-10-CM | POA: Diagnosis not present

## 2023-01-21 DIAGNOSIS — M4726 Other spondylosis with radiculopathy, lumbar region: Secondary | ICD-10-CM | POA: Diagnosis not present

## 2023-01-21 DIAGNOSIS — C50919 Malignant neoplasm of unspecified site of unspecified female breast: Secondary | ICD-10-CM | POA: Diagnosis not present

## 2023-01-21 DIAGNOSIS — M5116 Intervertebral disc disorders with radiculopathy, lumbar region: Secondary | ICD-10-CM | POA: Diagnosis not present

## 2023-01-22 ENCOUNTER — Telehealth: Payer: Self-pay

## 2023-01-22 ENCOUNTER — Encounter: Payer: Self-pay | Admitting: Family Medicine

## 2023-01-22 ENCOUNTER — Ambulatory Visit (INDEPENDENT_AMBULATORY_CARE_PROVIDER_SITE_OTHER): Payer: BC Managed Care – PPO | Admitting: Family Medicine

## 2023-01-22 ENCOUNTER — Other Ambulatory Visit: Payer: Self-pay | Admitting: Family Medicine

## 2023-01-22 DIAGNOSIS — M5416 Radiculopathy, lumbar region: Secondary | ICD-10-CM | POA: Diagnosis not present

## 2023-01-22 MED ORDER — OXYCODONE-ACETAMINOPHEN 5-325 MG PO TABS
1.0000 | ORAL_TABLET | Freq: Four times a day (QID) | ORAL | 0 refills | Status: DC | PRN
Start: 2023-01-22 — End: 2023-06-25

## 2023-01-22 NOTE — Telephone Encounter (Signed)
Called and got pt apt for today

## 2023-01-22 NOTE — Progress Notes (Signed)
Subjective: CC: Lumbar radiculopathy PCP: Raliegh Ip, DO JXB:JYNW L Ayoub is a 57 y.o. female presenting to clinic today for:  1.  Lumbar radiculopathy Patient has been under the care of chiropractic medicine for back pain.  A couple of weeks ago she developed severe back pain such that she could barely walk or sit.  She was seen here in office and given injections, steroid Dosepak and sent home with Percocet.  The injections were really of no use.  She has since seen neurosurgery who has obtained an MRI and that is still pending.  They discussed physical therapy versus injection therapy prior to pursuing any type of surgical intervention.  Overall things seem to be getting slightly better but still she experiences intermittent sharp pains that are greatly impactful.  On average pain has been somewhere between 6-7/10 and the Percocet reduces it to 4-5/10.  She has been using fiber and efforts to continue moving her bowels.  She reports no rectal bleeding or excessive sedation from the Percocet   ROS: Per HPI  Allergies  Allergen Reactions   Hyoscyamine Anaphylaxis   Fosamax [Alendronate] Nausea Only and Other (See Comments)    SEVERE NAUSEA   Nexium [Esomeprazole] Swelling and Other (See Comments)    Tongue became swollen- breathing not affected   Doxycycline Palpitations and Other (See Comments)    Racing heart   Past Medical History:  Diagnosis Date   Anemia    Anxiety    Breast cancer (HCC)    Clotting disorder (HCC)    Displacement of breast implant 12/24/2017   Diverticulitis    Diverticulosis    DVT (deep venous thrombosis) (HCC)    left leg knee and ankle    GERD (gastroesophageal reflux disease)    History of kidney stones    Kidney stones    Pneumonia    Pre-diabetes     Current Outpatient Medications:    anastrozole (ARIMIDEX) 1 MG tablet, Take 1 tablet (1 mg total) by mouth at bedtime., Disp: 90 tablet, Rfl: 3   glucose blood test strip, UAD to check  sugar. R73.03, Disp: 100 each, Rfl: 12   Lancets Thin MISC, UAD to check sugar. R73.03, Disp: 100 each, Rfl: 12   metroNIDAZOLE (METROGEL) 1 % gel, Apply topically daily. To face, Disp: 45 g, Rfl: 4   minocycline (MINOCIN) 100 MG capsule, Take 100 mg by mouth daily., Disp: , Rfl:    ondansetron (ZOFRAN) 4 MG tablet, Take 1 tablet (4 mg total) by mouth every 6 (six) hours as needed for nausea or vomiting., Disp: 8 tablet, Rfl: 2   TYLENOL 325 MG tablet, Take 325 mg by mouth every 6 (six) hours as needed for headache or mild pain., Disp: , Rfl:    XARELTO 20 MG TABS tablet, TAKE ONE TABLET ONCE DAILY WITH SUPPER (Patient taking differently: Take 20 mg by mouth in the morning.), Disp: 30 tablet, Rfl: 12   oxyCODONE-acetaminophen (PERCOCET/ROXICET) 5-325 MG tablet, Take 1-2 tablets by mouth every 6 (six) hours as needed for severe pain (pain score 7-10)., Disp: 40 tablet, Rfl: 0 Social History   Socioeconomic History   Marital status: Married    Spouse name: Tim   Number of children: 2   Years of education: some college   Highest education level: Some college, no degree  Occupational History   Occupation: [employer] banker, corporate  Tobacco Use   Smoking status: Never   Smokeless tobacco: Never  Building services engineer  status: Never Used  Substance and Sexual Activity   Alcohol use: Yes    Comment: 1 PER MONTH   Drug use: No   Sexual activity: Yes    Birth control/protection: I.U.D.  Other Topics Concern   Not on file  Social History Narrative   Lives with spouse   Caffeine use: no caffeine    Left handed    Works for an Chartered certified accountant to read fiction   Social Determinants of Corporate investment banker Strain: Low Risk  (01/11/2023)   Overall Financial Resource Strain (CARDIA)    Difficulty of Paying Living Expenses: Not hard at all  Food Insecurity: No Food Insecurity (01/11/2023)   Hunger Vital Sign    Worried About Running Out of Food in the Last Year: Never true    Ran Out of  Food in the Last Year: Never true  Transportation Needs: No Transportation Needs (01/11/2023)   PRAPARE - Administrator, Civil Service (Medical): No    Lack of Transportation (Non-Medical): No  Physical Activity: Insufficiently Active (01/11/2023)   Exercise Vital Sign    Days of Exercise per Week: 3 days    Minutes of Exercise per Session: 30 min  Stress: No Stress Concern Present (01/11/2023)   [employer] of Occupational Health - Occupational Stress Questionnaire    Feeling of Stress : Only a little  Social Connections: Socially Integrated (01/11/2023)   Social Connection and Isolation Panel [NHANES]    Frequency of Communication with Friends and Family: More than three times a week    Frequency of Social Gatherings with Friends and Family: Twice a week    Attends Religious Services: More than 4 times per year    Active Member of [employer] or Organizations: Yes    Attends Engineer, structural: More than 4 times per year    Marital Status: Married  Catering manager Violence: Not At Risk (08/17/2022)   Humiliation, Afraid, Rape, and Kick questionnaire    Fear of Current or Ex-Partner: No    Emotionally Abused: No    Physically Abused: No    Sexually Abused: No   Family History  Problem Relation Age of Onset   Hyperlipidemia Mother    Polymyalgia rheumatica Mother    Hypertension Father    Hyperlipidemia Father    Diabetes type I Brother    Ovarian cancer Maternal Aunt    Diabetes type II Maternal Grandfather    Allergies Daughter    Colon cancer Neg Hx    Esophageal cancer Neg Hx    Rectal cancer Neg Hx    Stomach cancer Neg Hx     Objective: Office vital signs reviewed. BP 112/76   Pulse 78   Temp 98.4 F (36.9 C)   Ht 5\' 4"  (1.626 m)   Wt 125 lb 9.6 oz (57 kg)   SpO2 99%   BMI 21.56 kg/m   Physical Examination:  General: Awake, alert, well nourished, No acute distress MSK: Minimally antalgic gait.  Ambulating independently.  Normal  tone.  Assessment/ Plan: 57 y.o. female   Lumbar radiculopathy, acute - Plan: oxyCODONE-acetaminophen (PERCOCET/ROXICET) 5-325 MG tablet  MRI results pending.  Continue to follow-up with neurosurgery as scheduled.  Anticipate physical therapy plus or minus spinal injections prior to any type of surgery, which patient really wants to avoid.  She is currently anticoagulated and seems to be doing well with this.  No concerns at this time except for pain control.  We discussed use of Colace with each Percocet.  Use cautiously.  Follow-up if needing more refills.  National narcotic database reviewed and there were no red flags.  Will perform UDS and CSC if needed going forward but I do not anticipate that she will be on this medication for any significant length of time   Raliegh Ip, DO [facility] [phone]

## 2023-02-08 ENCOUNTER — Telehealth: Payer: Self-pay | Admitting: *Deleted

## 2023-02-08 NOTE — Telephone Encounter (Signed)
Received call from requesting advice from MD if Prolia should be continued despite symptoms of neuropathy post last injection. Per MD pt needing to be switched to Q6 month Zometa.  Message sent to pharmacy team and scheduling.  Pt educated and verbalized understanding.

## 2023-02-13 DIAGNOSIS — Z170 Estrogen receptor positive status [ER+]: Secondary | ICD-10-CM | POA: Diagnosis not present

## 2023-02-13 DIAGNOSIS — C50411 Malignant neoplasm of upper-outer quadrant of right female breast: Secondary | ICD-10-CM | POA: Diagnosis not present

## 2023-02-14 NOTE — Telephone Encounter (Signed)
Yes, please call reading room to see if we can get read done.

## 2023-02-22 ENCOUNTER — Other Ambulatory Visit: Payer: Self-pay | Admitting: *Deleted

## 2023-02-22 DIAGNOSIS — Z170 Estrogen receptor positive status [ER+]: Secondary | ICD-10-CM

## 2023-02-22 NOTE — Progress Notes (Addendum)
Referring Physician:  Raliegh Ip, DO [address] [place],  [place] [address]  Primary Physician:  Raliegh Ip, DO  History of Present Illness: Stephanie Brewer has a history of osteoporosis, GERD, breast CA, history of DVT.   History of chronic LBP x years with intermittent flare ups.   Last seen by me on 01/15/23 for constant LBP with right groin pain. She has known lumbar spondylosis with minimal curve. Symptoms appear to be lumbar mediated. No groin pain with IR/ER of her right hip.   MRI was ordered and she is here to review it.   She had LBP and right groin pain for about 3-4 weeks and then it improved. She currently has no pain. No numbness, tingling, or weakness noted. She feels great.   She is on XARELTO.   Bowel/Bladder Dysfunction: none  Conservative measures: seen a chiropractor  Physical therapy: has not participated in  Multimodal medical therapy including regular antiinflammatories: toradol injection, prednisone, oxycodone  Injections: has not received epidural steroid injections  Past Surgery: no previous spinal surgeries  Stephanie Brewer has no symptoms of cervical myelopathy.  The symptoms are causing a significant impact on the patient's life.   Review of Systems:  A 10 point review of systems is negative, except for the pertinent positives and negatives detailed in the HPI.  Past Medical History: Past Medical History:  Diagnosis Date   Anemia    Anxiety    Breast cancer (HCC)    Clotting disorder (HCC)    Displacement of breast implant 12/24/2017   Diverticulitis    Diverticulosis    DVT (deep venous thrombosis) (HCC)    left leg knee and ankle    GERD (gastroesophageal reflux disease)    History of kidney stones    Kidney stones    Pneumonia    Pre-diabetes     Past Surgical History: Past Surgical History:  Procedure Laterality Date   BREAST BIOPSY Left 08/15/2021   BREAST ENHANCEMENT SURGERY     Augmentation 2003    BREAST IMPLANT REMOVAL Bilateral 01/14/2020   Procedure: REMOVAL BREAST IMPLANTS;  Surgeon: Allena Napoleon, MD;  Location: [facility];  Service: Plastics;  Laterality: Bilateral;   BREAST LUMPECTOMY Right 01/2020   BREAST LUMPECTOMY WITH RADIOACTIVE SEED AND SENTINEL LYMPH NODE BIOPSY Right 01/14/2020   Procedure: Right breast seed localized lumpectomy with sentinel lymph node biopsy;  Surgeon: Emelia Loron, MD;  Location: [facility];  Service: General;  Laterality: Right;   BUNIONECTOMY Bilateral 2005   Right foot in 2006   LIPOSUCTION WITH LIPOFILLING Bilateral 11/29/2020   Procedure: LIPOSUCTION WITH LIPOFILLING FROM ABDOMEN TO BILATERAL BREASTS;  Surgeon: Allena Napoleon, MD;  Location: [facility];  Service: Plastics;  Laterality: Bilateral;   MASTOPEXY Left 11/29/2020   Procedure: LEFT MASTOPEXY;  Surgeon: Allena Napoleon, MD;  Location: [facility];  Service: Plastics;  Laterality: Left;   PORTACATH PLACEMENT N/A 09/01/2019   Procedure: INSERTION PORT-A-CATH WITH ULTRASOUND GUIDANCE;  Surgeon: Emelia Loron, MD;  Location: [facility];  Service: General;  Laterality: N/A;   PROCTOSCOPY N/A 08/17/2022   Procedure: RIGID PROCTOSCOPY;  Surgeon: Karie Soda, MD;  Location: [facility];  Service: General;  Laterality: N/A;   TRIGGER FINGER RELEASE Left 05/09/2020   Procedure: RELEASE TRIGGER FINGER/A-1 PULLEY LEFT LONG;  Surgeon: Betha Loa, MD;  Location: [facility];  Service: Orthopedics;  Laterality: Left;   UMBILICAL  HERNIA REPAIR  2002    Allergies: Allergies as of 03/05/2023 - Review Complete 01/22/2023  Allergen Reaction Noted   Hyoscyamine Anaphylaxis 01/15/2017   Fosamax [alendronate] Nausea Only and Other (See Comments) 05/23/2022   Nexium [esomeprazole] Swelling and Other (See Comments) 12/06/2015   Doxycycline Palpitations and Other (See Comments) 12/06/2015     Medications: Outpatient Encounter Medications as of 03/05/2023  Medication Sig   anastrozole (ARIMIDEX) 1 MG tablet Take 1 tablet (1 mg total) by mouth at bedtime.   glucose blood test strip UAD to check sugar. R73.03   Lancets Thin MISC UAD to check sugar. R73.03   metroNIDAZOLE (METROGEL) 1 % gel Apply topically daily. To face   minocycline (MINOCIN) 100 MG capsule Take 100 mg by mouth daily.   ondansetron (ZOFRAN) 4 MG tablet Take 1 tablet (4 mg total) by mouth every 6 (six) hours as needed for nausea or vomiting.   oxyCODONE-acetaminophen (PERCOCET/ROXICET) 5-325 MG tablet Take 1-2 tablets by mouth every 6 (six) hours as needed for severe pain (pain score 7-10).   TYLENOL 325 MG tablet Take 325 mg by mouth every 6 (six) hours as needed for headache or mild pain.   XARELTO 20 MG TABS tablet TAKE ONE TABLET ONCE DAILY WITH SUPPER (Patient taking differently: Take 20 mg by mouth in the morning.)   No facility-administered encounter medications on file as of 03/05/2023.    Social History: Social History   Tobacco Use   Smoking status: Never   Smokeless tobacco: Never  Vaping Use   Vaping status: Never Used  Substance Use Topics   Alcohol use: Yes    Comment: 1 PER MONTH   Drug use: No    Family Medical History: Family History  Problem Relation Age of Onset   Hyperlipidemia Mother    Polymyalgia rheumatica Mother    Hypertension Father    Hyperlipidemia Father    Diabetes type I Brother    Ovarian cancer Maternal Aunt    Diabetes type II Maternal Grandfather    Allergies Daughter    Colon cancer Neg Hx    Esophageal cancer Neg Hx    Rectal cancer Neg Hx    Stomach cancer Neg Hx     Physical Examination: There were no vitals filed for this visit.    Awake, alert, oriented to person, place, and time.  Speech is clear and fluent. Fund of knowledge is appropriate.   Cranial Nerves: Pupils equal round and reactive to light.  Facial tone is symmetric.    Very mild  lower lumbar tenderness.   No abnormal lesions on exposed skin.   Strength:  Side Iliopsoas Quads Hamstring PF DF EHL  R 5 5 5 5 5 5   L 5 5 5 5 5 5    Reflexes are 2+ and symmetric at the patella and achilles.    Clonus is not present.   Bilateral lower extremity sensation is intact to light touch.   Gait is normal.     Medical Decision Making  Imaging: Lumbar MRI dated 01/21/23:  FINDINGS: Segmentation:  Standard.   Alignment:  No substantial sagittal subluxation.   Vertebrae: No fracture, evidence of discitis, or suspicious bone lesion. 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   Conus medullaris and cauda equina: Conus extends to the L1-L2 level. Conus appears normal.   Paraspinal and other soft tissues: Unremarkable.   Disc levels:   T12-L1: No significant disc protrusion, foraminal stenosis, or canal stenosis.   L1-L2: No  significant disc protrusion, foraminal stenosis, or canal stenosis.   L2-L3: No significant disc protrusion, foraminal stenosis, or canal stenosis.   L3-L4: Disc bulging.  Facet arthropathy.  No significant stenosis.   L4-L5: Mild disc bulging. Facet arthropathy. No significant stenosis.   L5-S1: Mild disc bulging. Facet arthropathy. No significant stenosis.   IMPRESSION: Mild multilevel degenerative change without significant stenosis.     Electronically Signed   By: Feliberto Harts M.D.   On: 02/14/2023 15:46    I have personally reviewed the images and agree with the above interpretation. She has large right renal cyst that was seen on previous CT of abdomen on 12/26/21.   Assessment and Plan: Stephanie Brewer is a pleasant 57 y.o. female who has a history of chronic LBP x years with intermittent flare ups.   She had 3-4 weeks of constant LBP with right groin pain that has resolved. She has no current pain. No numbness, tingling, or weakness.   She has known diffuse lumbar spondylosis and facet  hypertrophy. No foraminal or central stenosis appreciated on above MRI.   Treatment options discussed with patient and following plan made:   - PT ordered at [facility] in [place].  - Recommend she stop seeing her chiropractor. Given letter.  - Right renal cyst seen on lumbar MRI- was present on CT from 12/26/21. Message sent to PCP and oncology regarding this. Patient is aware.  - If pain returns, consider referral for possible facet injections.  - Follow up with me in 6-8 weeks for recheck.   ADDENDUM 03/08/23:  PCP and oncology agree that no further workup needed for renal cyst. Message sent to patient.   I spent a total of 20 minutes in face-to-face and non-face-to-face activities related to this patient's care today including review of outside records, review of imaging, review of symptoms, physical exam, discussion of differential diagnosis, discussion of treatment options, and documentation.   Drake Leach PA-C Dept. of Neurosurgery

## 2023-02-25 ENCOUNTER — Inpatient Hospital Stay: Payer: BC Managed Care – PPO | Attending: Hematology

## 2023-02-25 ENCOUNTER — Inpatient Hospital Stay: Payer: BC Managed Care – PPO

## 2023-02-25 VITALS — BP 105/68 | HR 87 | Temp 98.8°F | Resp 18 | Wt 124.2 lb

## 2023-02-25 DIAGNOSIS — C50411 Malignant neoplasm of upper-outer quadrant of right female breast: Secondary | ICD-10-CM | POA: Diagnosis not present

## 2023-02-25 DIAGNOSIS — M810 Age-related osteoporosis without current pathological fracture: Secondary | ICD-10-CM | POA: Insufficient documentation

## 2023-02-25 DIAGNOSIS — Z170 Estrogen receptor positive status [ER+]: Secondary | ICD-10-CM

## 2023-02-25 LAB — CBC WITH DIFFERENTIAL (CANCER CENTER ONLY)
Abs Immature Granulocytes: 0.02 10*3/uL (ref 0.00–0.07)
Basophils Absolute: 0 10*3/uL (ref 0.0–0.1)
Basophils Relative: 1 %
Eosinophils Absolute: 0 10*3/uL (ref 0.0–0.5)
Eosinophils Relative: 1 %
HCT: 39.5 % (ref 36.0–46.0)
Hemoglobin: 13.1 g/dL (ref 12.0–15.0)
Immature Granulocytes: 1 %
Lymphocytes Relative: 16 %
Lymphs Abs: 0.6 10*3/uL — ABNORMAL LOW (ref 0.7–4.0)
MCH: 29.8 pg (ref 26.0–34.0)
MCHC: 33.2 g/dL (ref 30.0–36.0)
MCV: 89.8 fL (ref 80.0–100.0)
Monocytes Absolute: 0.5 10*3/uL (ref 0.1–1.0)
Monocytes Relative: 14 %
Neutro Abs: 2.5 10*3/uL (ref 1.7–7.7)
Neutrophils Relative %: 67 %
Platelet Count: 146 10*3/uL — ABNORMAL LOW (ref 150–400)
RBC: 4.4 MIL/uL (ref 3.87–5.11)
RDW: 13 % (ref 11.5–15.5)
WBC Count: 3.7 10*3/uL — ABNORMAL LOW (ref 4.0–10.5)
nRBC: 0 % (ref 0.0–0.2)

## 2023-02-25 LAB — CMP (CANCER CENTER ONLY)
ALT: 10 U/L (ref 0–44)
AST: 19 U/L (ref 15–41)
Albumin: 4 g/dL (ref 3.5–5.0)
Alkaline Phosphatase: 52 U/L (ref 38–126)
Anion gap: 8 (ref 5–15)
BUN: 10 mg/dL (ref 6–20)
CO2: 28 mmol/L (ref 22–32)
Calcium: 8.6 mg/dL — ABNORMAL LOW (ref 8.9–10.3)
Chloride: 103 mmol/L (ref 98–111)
Creatinine: 0.65 mg/dL (ref 0.44–1.00)
GFR, Estimated: >60 mL/min (ref >60)
Glucose, Bld: 98 mg/dL (ref 70–99)
Potassium: 3.2 mmol/L — ABNORMAL LOW (ref 3.5–5.1)
Sodium: 139 mmol/L (ref 135–145)
Total Bilirubin: 0.4 mg/dL (ref <1.2)
Total Protein: 6.8 g/dL (ref 6.5–8.1)

## 2023-02-25 LAB — MAGNESIUM: Magnesium: 1.8 mg/dL (ref 1.7–2.4)

## 2023-02-25 MED ORDER — SODIUM CHLORIDE 0.9 % IV SOLN: INTRAVENOUS | Status: DC

## 2023-02-25 MED ORDER — ONDANSETRON HCL 4 MG/2ML IJ SOLN
8.0000 mg | Freq: Once | INTRAMUSCULAR | Status: AC
  Administered 2023-02-25: 8 mg via INTRAVENOUS
  Filled 2023-02-25: qty 4

## 2023-02-25 MED ORDER — ZOLEDRONIC ACID 4 MG/100ML IV SOLN
4.0000 mg | Freq: Once | INTRAVENOUS | Status: AC
  Administered 2023-02-25: 4 mg via INTRAVENOUS
  Filled 2023-02-25: qty 100

## 2023-02-25 NOTE — Patient Instructions (Addendum)
Zoledronic Acid Injection (Cancer) What is this medication? ZOLEDRONIC ACID (ZOE le dron ik AS id) treats high calcium levels in the blood caused by cancer. It may also be used with chemotherapy to treat weakened bones caused by cancer. It works by slowing down the release of calcium from bones. This lowers calcium levels in your blood. It also makes your bones stronger and less likely to break (fracture). It belongs to a group of medications called bisphosphonates. This medicine may be used for other purposes; ask your health care provider or pharmacist if you have questions. COMMON BRAND NAME(S): Zometa, Zometa Powder What should I tell my care team before I take this medication? They need to know if you have any of these conditions: Dehydration Dental disease Kidney disease Liver disease Low levels of calcium in the blood Lung or breathing disease, such as asthma Receiving steroids, such as dexamethasone or prednisone An unusual or allergic reaction to zoledronic acid, other medications, foods, dyes, or preservatives Pregnant or trying to get pregnant Breast-feeding How should I use this medication? This medication is injected into a vein. It is given by your care team in a hospital or clinic setting. Talk to your care team about the use of this medication in children. Special care may be needed. Overdosage: If you think you have taken too much of this medicine contact a poison control center or emergency room at once. NOTE: This medicine is only for you. Do not share this medicine with others. What if I miss a dose? Keep appointments for follow-up doses. It is important not to miss your dose. Call your care team if you are unable to keep an appointment. What may interact with this medication? Certain antibiotics given by injection Diuretics, such as bumetanide, furosemide NSAIDs, medications for pain and inflammation, such as ibuprofen or naproxen Teriparatide Thalidomide This list  may not describe all possible interactions. Give your health care provider a list of all the medicines, herbs, non-prescription drugs, or dietary supplements you use. Also tell them if you smoke, drink alcohol, or use illegal drugs. Some items may interact with your medicine. What should I watch for while using this medication? Visit your care team for regular checks on your progress. It may be some time before you see the benefit from this medication. Some people who take this medication have severe bone, joint, or muscle pain. This medication may also increase your risk for jaw problems or a broken thigh bone. Tell your care team right away if you have severe pain in your jaw, bones, joints, or muscles. Tell you care team if you have any pain that does not go away or that gets worse. Tell your dentist and dental surgeon that you are taking this medication. You should not have major dental surgery while on this medication. See your dentist to have a dental exam and fix any dental problems before starting this medication. Take good care of your teeth while on this medication. Make sure you see your dentist for regular follow-up appointments. You should make sure you get enough calcium and vitamin D while you are taking this medication. Discuss the foods you eat and the vitamins you take with your care team. Check with your care team if you have severe diarrhea, nausea, and vomiting, or if you sweat a lot. The loss of too much body fluid may make it dangerous for you to take this medication. You may need bloodwork while taking this medication. Talk to your care team if  you wish to become pregnant or think you might be pregnant. This medication can cause serious birth defects. What side effects may I notice from receiving this medication? Side effects that you should report to your care team as soon as possible: Allergic reactions--skin rash, itching, hives, swelling of the face, lips, tongue, or  throat Kidney injury--decrease in the amount of urine, swelling of the ankles, hands, or feet Low calcium level--muscle pain or cramps, confusion, tingling, or numbness in the hands or feet Osteonecrosis of the jaw--pain, swelling, or redness in the mouth, numbness of the jaw, poor healing after dental work, unusual discharge from the mouth, visible bones in the mouth Severe bone, joint, or muscle pain Side effects that usually do not require medical attention (report to your care team if they continue or are bothersome): Constipation Fatigue Fever Loss of appetite Nausea Stomach pain This list may not describe all possible side effects. Call your doctor for medical advice about side effects. You may report side effects to FDA at 1-800-FDA-1088. Where should I keep my medication? This medication is given in a hospital or clinic. It will not be stored at home. NOTE: This sheet is a summary. It may not cover all possible information. If you have questions about this medicine, talk to your doctor, pharmacist, or health care provider.  2024 Elsevier/Gold Standard (2021-05-12 00:00:00)  Hypokalemia Hypokalemia means that the amount of potassium in the blood is lower than normal. Potassium is a mineral (electrolyte) that helps regulate the amount of fluid in the body. It also stimulates muscle tightening (contraction) and helps nerves work properly. Normally, most of the body's potassium is inside cells, and only a very small amount is in the blood. Because the amount in the blood is so small, minor changes to potassium levels in the blood can be life-threatening. What are the causes? This condition may be caused by: Antibiotic medicine. Diarrhea or vomiting. Taking too much of a medicine that helps you have a bowel movement (laxative) can cause diarrhea and lead to hypokalemia. Chronic kidney disease (CKD). Medicines that help the body get rid of excess fluid (diuretics). Eating disorders,  such as anorexia or bulimia. Low magnesium levels in the body. Sweating a lot. What are the signs or symptoms? Symptoms of this condition include: Weakness. Constipation. Fatigue. Muscle cramps. Mental confusion. Skipped heartbeats or irregular heartbeat (palpitations). Tingling or numbness. How is this diagnosed? This condition is diagnosed with a blood test. How is this treated? This condition may be treated by: Taking potassium supplements. Adjusting the medicines that you take. Eating more foods that contain a lot of potassium. If your potassium level is very low, you may need to get potassium through an IV and be monitored in the hospital. Follow these instructions at home: Eating and drinking  Eat a healthy diet. A healthy diet includes fresh fruits and vegetables, whole grains, healthy fats, and lean proteins. If told, eat more foods that contain a lot of potassium. These include: Nuts, such as peanuts and pistachios. Seeds, such as sunflower seeds and pumpkin seeds. Peas, lentils, and lima beans. Whole grain and bran cereals and breads. Fresh fruits and vegetables, such as apricots, avocado, bananas, cantaloupe, kiwi, oranges, tomatoes, asparagus, and potatoes. Juices, such as orange, tomato, and prune. Lean meats, including fish. Milk and milk products, such as yogurt. General instructions Take over-the-counter and prescription medicines only as told by your health care provider. This includes vitamins, natural food products, and supplements. Keep all follow-up visits. This  is important. Contact a health care provider if: You have weakness that gets worse. You feel your heart pounding or racing. You vomit. You have diarrhea. You have diabetes and you have trouble keeping your blood sugar in your target range. Get help right away if: You have chest pain. You have shortness of breath. You have vomiting or diarrhea that lasts for more than 2 days. You faint. These  symptoms may be an emergency. Get help right away. Call 911. Do not wait to see if the symptoms will go away. Do not drive yourself to the hospital. Summary Hypokalemia means that the amount of potassium in the blood is lower than normal. This condition is diagnosed with a blood test. Hypokalemia may be treated by taking potassium supplements, adjusting the medicines that you take, or eating more foods that are high in potassium. If your potassium level is very low, you may need to get potassium through an IV and be monitored in the hospital. This information is not intended to replace advice given to you by your health care provider. Make sure you discuss any questions you have with your health care provider. Document Revised: 12/01/2020 Document Reviewed: 12/01/2020 Elsevier Patient Education  2024 [employer].

## 2023-02-25 NOTE — Progress Notes (Signed)
Per Dr. Pamelia Hoit, ok for treatment today with corrected calcium 8.6 mg/dL and no dental clearance. Pt. denies any dental issues, concerns, or recent procedures.

## 2023-03-05 ENCOUNTER — Ambulatory Visit (INDEPENDENT_AMBULATORY_CARE_PROVIDER_SITE_OTHER): Payer: BC Managed Care – PPO | Admitting: Orthopedic Surgery

## 2023-03-05 ENCOUNTER — Encounter: Payer: Self-pay | Admitting: Orthopedic Surgery

## 2023-03-05 VITALS — BP 118/78 | Ht 64.0 in | Wt 124.0 lb

## 2023-03-05 DIAGNOSIS — M47816 Spondylosis without myelopathy or radiculopathy, lumbar region: Secondary | ICD-10-CM | POA: Diagnosis not present

## 2023-03-05 NOTE — Patient Instructions (Signed)
It was so nice to see you today. Thank you so much for coming in.    You have some wear and tear in your back (arthritis) and this is likely what was causing your back pain.   You have a cyst on the right kidney- it was seen back on CT of abdomen done on 12/26/21. I will send a message to Dr. Nadine Counts and Dr. Pamelia Hoit about this to make sure nothing else needs to be done.   I sent physical therapy orders to [facility] in [place]. They should call you.   I will see you back in 6-8 weeks. Please do not hesitate to call if you have any questions or concerns. You can also message me in MyChart.   Drake Leach PA-C [phone]     The physicians and staff at [facility] at [facility] are committed to providing excellent care. You may receive a survey asking for feedback about your experience at our office. We value you your feedback and appreciate you taking the time to to fill it out. The [facility] leadership team is also available to discuss your experience in person, feel free to contact us [phone].

## 2023-03-14 ENCOUNTER — Other Ambulatory Visit: Payer: Self-pay

## 2023-03-14 ENCOUNTER — Ambulatory Visit: Payer: BC Managed Care – PPO | Attending: Hematology and Oncology

## 2023-03-14 DIAGNOSIS — M47816 Spondylosis without myelopathy or radiculopathy, lumbar region: Secondary | ICD-10-CM | POA: Insufficient documentation

## 2023-03-14 DIAGNOSIS — M5459 Other low back pain: Secondary | ICD-10-CM | POA: Insufficient documentation

## 2023-03-14 DIAGNOSIS — M6281 Muscle weakness (generalized): Secondary | ICD-10-CM | POA: Diagnosis not present

## 2023-03-14 NOTE — Therapy (Signed)
OUTPATIENT PHYSICAL THERAPY THORACOLUMBAR EVALUATION   Patient Name: Stephanie Brewer MRN: [id number] DOB:[birth year], 57 y.o., female Today's Date: 03/14/2023  END OF SESSION:  PT End of Session - 03/14/23 0853     Visit Number 1    Number of Visits 8    Date for PT Re-Evaluation 05/03/23    PT Start Time 0854    PT Stop Time 0935    PT Time Calculation (min) 41 min    Activity Tolerance Patient tolerated treatment well    Behavior During Therapy [facility] for tasks assessed/performed             Past Medical History:  Diagnosis Date   Anemia    Anxiety    Breast cancer (HCC)    Clotting disorder (HCC)    Displacement of breast implant 12/24/2017   Diverticulitis    Diverticulosis    DVT (deep venous thrombosis) (HCC)    left leg knee and ankle    GERD (gastroesophageal reflux disease)    History of kidney stones    Kidney stones    Pneumonia    Pre-diabetes    Past Surgical History:  Procedure Laterality Date   BREAST BIOPSY Left 08/15/2021   BREAST ENHANCEMENT SURGERY     Augmentation 2003   BREAST IMPLANT REMOVAL Bilateral 01/14/2020   Procedure: REMOVAL BREAST IMPLANTS;  Surgeon: Allena Napoleon, MD;  Location: [facility];  Service: Plastics;  Laterality: Bilateral;   BREAST LUMPECTOMY Right 01/2020   BREAST LUMPECTOMY WITH RADIOACTIVE SEED AND SENTINEL LYMPH NODE BIOPSY Right 01/14/2020   Procedure: Right breast seed localized lumpectomy with sentinel lymph node biopsy;  Surgeon: Emelia Loron, MD;  Location: [facility];  Service: General;  Laterality: Right;   BUNIONECTOMY Bilateral 2005   Right foot in 2006   LIPOSUCTION WITH LIPOFILLING Bilateral 11/29/2020   Procedure: LIPOSUCTION WITH LIPOFILLING FROM ABDOMEN TO BILATERAL BREASTS;  Surgeon: Allena Napoleon, MD;  Location: [facility];  Service: Plastics;  Laterality: Bilateral;   MASTOPEXY Left 11/29/2020   Procedure: LEFT MASTOPEXY;  Surgeon: Allena Napoleon, MD;  Location: [facility];  Service: Plastics;  Laterality: Left;   PORTACATH PLACEMENT N/A 09/01/2019   Procedure: INSERTION PORT-A-CATH WITH ULTRASOUND GUIDANCE;  Surgeon: Emelia Loron, MD;  Location: [facility];  Service: General;  Laterality: N/A;   PROCTOSCOPY N/A 08/17/2022   Procedure: RIGID PROCTOSCOPY;  Surgeon: Karie Soda, MD;  Location: [facility];  Service: General;  Laterality: N/A;   TRIGGER FINGER RELEASE Left 05/09/2020   Procedure: RELEASE TRIGGER FINGER/A-1 PULLEY LEFT LONG;  Surgeon: Betha Loa, MD;  Location: [facility];  Service: Orthopedics;  Laterality: Left;   UMBILICAL HERNIA REPAIR  2002   Patient Active Problem List   Diagnosis Date Noted   Chronic nausea 08/20/2022   Sigmoid diverticulitis 08/17/2022   Chronic anticoagulation 07/16/2022   History of breast cancer 07/16/2022   Hypokalemia 05/27/2022   Normocytic anemia 05/26/2022   History of recurrent deep vein thrombosis (DVT) 05/26/2022   Pre-diabetes 05/23/2022   Pure hypercholesterolemia 05/23/2022   Osteoporosis of femur without pathological fracture 11/15/2021   Overweight (BMI 25.0-29.9) 08/15/2021   Port-A-Cath in place 09/09/2019   Malignant neoplasm of upper-outer quadrant of right breast in female, estrogen receptor positive (HCC) 08/20/2019   Breast cancer metastasized to axillary lymph node, right (HCC) 08/18/2019   Trigger middle finger of left hand 02/10/2019   Radicular leg pain 02/04/2018  H/O bilateral breast implants 12/24/2017   GERD (gastroesophageal reflux disease) 12/06/2015   Gluten intolerance 12/06/2015    PCP: Raliegh Ip, DO  REFERRING PROVIDER: Drake Leach, PA-C   REFERRING DIAG: Lumbar spondylosis   Rationale for Evaluation and Treatment: Rehabilitation  THERAPY DIAG:  Muscle weakness (generalized) - Plan: PT plan of care cert/re-cert  Other low back pain - Plan: PT plan of care cert/re-cert  ONSET  DATE: "a couple months ago"  SUBJECTIVE:                                                                                                                                                                                           SUBJECTIVE STATEMENT: Patient reports that she is not hurting as bad today. However, when it first began she could hardly lift her right leg. She was initially going to a chiropractor when her pain first began. The worst pain lasted for about 3 weeks, but it has been getting better since then. She has always had slight back pain, but nothing like this. She had also been having sciatica on and off for years, but it has become more frequent.   PERTINENT HISTORY:  Osteoporosis, history of breast cancer, history of DVT, and anxiety  PAIN:  Are you having pain? Yes: NPRS scale: 1/10 Pain location: low back Pain description: intermittent aching and tired Aggravating factors: gets worse as the day goes on Relieving factors: "I just work through it"   PRECAUTIONS: Fall  RED FLAGS: None   WEIGHT BEARING RESTRICTIONS: No  FALLS:  Has patient fallen in last 6 months? Yes. Number of falls 1; approximately 1 week ago when she was walking in her back door  LIVING ENVIRONMENT: Lives with: lives with their spouse Lives in: House/apartment Stairs: Yes: Internal: 15 steps; on right going up and External: 2 steps; none; reciprocal pattern Has following equipment at home: None  OCCUPATION: sells insurance  PLOF: Independent  PATIENT GOALS: improved strength to reduce her risk of re injury  NEXT MD VISIT: 04/24/23  OBJECTIVE:  Note: Objective measures were completed at Evaluation unless otherwise noted.  DIAGNOSTIC FINDINGS: 01/21/23 lumbar MRI IMPRESSION: Mild multilevel degenerative change without significant stenosis.  COGNITION: Overall cognitive status: Within functional limits for tasks assessed     SENSATION: Patient reports no numbness or tingling since  her initial injury  POSTURE: forward head and decreased lumbar lordosis  PALPATION: TTP: right lumbar paraspinals and L3-5 spinous processes   LUMBAR JOINT MOBILITY:  L1-3: WFL and nonpainful   L3-5: hypomobile and nonpainful   LUMBAR ROM:   AROM eval  Flexion 64; "pulling"  Extension 20; " it feels good"   Right lateral flexion   Left lateral flexion   Right rotation 25% limited  Left rotation WFL    (Blank rows = not tested)  LOWER EXTREMITY ROM: WFL for activities assessed  LOWER EXTREMITY MMT:    MMT Right eval Left eval  Hip flexion 3+/5;  slight hip pain  3+/5  Hip extension    Hip abduction    Hip adduction    Hip internal rotation    Hip external rotation    Knee flexion 4/5 4/5  Knee extension 4+/5 5/5  Ankle dorsiflexion 4/5 4/5  Ankle plantarflexion    Ankle inversion    Ankle eversion     (Blank rows = not tested)  GAIT: Assistive device utilized: None Level of assistance: Complete Independence Comments: no significant gait deviations observed  TODAY'S TREATMENT:                                                                                                                              DATE:                                      EXERCISE LOG  Exercise Repetitions and Resistance Comments  SLR  15 reps each  Added to HEP   Bridge  10 reps   Bridge with march  10 reps  Added to HEP   Supine hip ADD isometric  15 reps w/ 5 second hold  Added to HEP       Blank cell = exercise not performed today   PATIENT EDUCATION:  Education details: POC, healing, prognosis, referred pain, HEP, and goals for physical therapy Person educated: Patient Education method: Explanation Education comprehension: verbalized understanding  HOME EXERCISE PROGRAM:   ASSESSMENT:  CLINICAL IMPRESSION: Patient is a 57 y.o. female who was seen today for physical therapy evaluation and treatment for low back pain. She presented with low pain severity and irritability  with palpation to her right lumbar paraspinals reproducing her familiar symptoms. She also exhibited reduced lower extremity strength bilaterally. Recommend that she continue with skilled physical therapy to address her impairments to return to her prior level of function.    OBJECTIVE IMPAIRMENTS: decreased strength, hypomobility, impaired tone, and pain.   ACTIVITY LIMITATIONS: standing  PARTICIPATION LIMITATIONS: community activity  PERSONAL FACTORS: Time since onset of injury/illness/exacerbation and 3+ comorbidities: Osteoporosis, history of breast cancer, history of DVT, and anxiety  are also affecting patient's functional outcome.   REHAB POTENTIAL: Good  CLINICAL DECISION MAKING: Stable/uncomplicated  EVALUATION COMPLEXITY: Low   GOALS: Goals reviewed with patient? Yes  LONG TERM GOALS: Target date: 04/11/23  Patient will be independent with HEP.  Baseline:  Goal status: INITIAL  2.  Patient will be able to demonstrate improved bilateral hip flexor strength to at least 4-/5 for improved functional mobility.  Baseline:  Goal status: INITIAL  3.  Patient will be able to complete her daily activities without being limited by her familiar low back symptoms.  Baseline:  Goal status: INITIAL  4.  Patient will be able to demonstrate proper lifting mechanics to reduce her risk of re injury.  Baseline:  Goal status: INITIAL  PLAN:  PT FREQUENCY: 1-2x/week  PT DURATION: 4 weeks  PLANNED INTERVENTIONS: 97164- PT Re-evaluation, 97110-Therapeutic exercises, 97530- Therapeutic activity, 97112- Neuromuscular re-education, 97535- Self Care, 32951- Manual therapy, Patient/Family education, Balance training, Dry Needling, Joint mobilization, Spinal mobilization, Cryotherapy, and Moist heat.  PLAN FOR NEXT SESSION: review HEP, lumbar and lower extremity strengthening, and manual therapy as needed   Granville Lewis, PT 03/14/2023, 12:56 PM

## 2023-03-15 ENCOUNTER — Other Ambulatory Visit: Payer: Self-pay | Admitting: Hematology and Oncology

## 2023-03-19 ENCOUNTER — Ambulatory Visit: Payer: BC Managed Care – PPO

## 2023-03-19 DIAGNOSIS — M6281 Muscle weakness (generalized): Secondary | ICD-10-CM

## 2023-03-19 DIAGNOSIS — M47816 Spondylosis without myelopathy or radiculopathy, lumbar region: Secondary | ICD-10-CM | POA: Diagnosis not present

## 2023-03-19 DIAGNOSIS — M5459 Other low back pain: Secondary | ICD-10-CM

## 2023-03-19 NOTE — Therapy (Signed)
OUTPATIENT PHYSICAL THERAPY THORACOLUMBAR TREATMENT   Patient Name: Stephanie Brewer MRN: [id number] DOB:[birth year], 57 y.o., female Today's Date: 03/19/2023  END OF SESSION:  PT End of Session - 03/19/23 0804     Visit Number 2    Number of Visits 8    Date for PT Re-Evaluation 05/03/23    PT Start Time 0800    PT Stop Time 0845    PT Time Calculation (min) 45 min    Activity Tolerance Patient tolerated treatment well    Behavior During Therapy [facility] for tasks assessed/performed              Past Medical History:  Diagnosis Date   Anemia    Anxiety    Breast cancer (HCC)    Clotting disorder (HCC)    Displacement of breast implant 12/24/2017   Diverticulitis    Diverticulosis    DVT (deep venous thrombosis) (HCC)    left leg knee and ankle    GERD (gastroesophageal reflux disease)    History of kidney stones    Kidney stones    Pneumonia    Pre-diabetes    Past Surgical History:  Procedure Laterality Date   BREAST BIOPSY Left 08/15/2021   BREAST ENHANCEMENT SURGERY     Augmentation 2003   BREAST IMPLANT REMOVAL Bilateral 01/14/2020   Procedure: REMOVAL BREAST IMPLANTS;  Surgeon: Allena Napoleon, MD;  Location: [facility];  Service: Plastics;  Laterality: Bilateral;   BREAST LUMPECTOMY Right 01/2020   BREAST LUMPECTOMY WITH RADIOACTIVE SEED AND SENTINEL LYMPH NODE BIOPSY Right 01/14/2020   Procedure: Right breast seed localized lumpectomy with sentinel lymph node biopsy;  Surgeon: Emelia Loron, MD;  Location: [facility];  Service: General;  Laterality: Right;   BUNIONECTOMY Bilateral 2005   Right foot in 2006   LIPOSUCTION WITH LIPOFILLING Bilateral 11/29/2020   Procedure: LIPOSUCTION WITH LIPOFILLING FROM ABDOMEN TO BILATERAL BREASTS;  Surgeon: Allena Napoleon, MD;  Location: [facility];  Service: Plastics;  Laterality: Bilateral;   MASTOPEXY Left 11/29/2020   Procedure: LEFT MASTOPEXY;  Surgeon: Allena Napoleon, MD;  Location: [facility];  Service: Plastics;  Laterality: Left;   PORTACATH PLACEMENT N/A 09/01/2019   Procedure: INSERTION PORT-A-CATH WITH ULTRASOUND GUIDANCE;  Surgeon: Emelia Loron, MD;  Location: [facility];  Service: General;  Laterality: N/A;   PROCTOSCOPY N/A 08/17/2022   Procedure: RIGID PROCTOSCOPY;  Surgeon: Karie Soda, MD;  Location: [facility];  Service: General;  Laterality: N/A;   TRIGGER FINGER RELEASE Left 05/09/2020   Procedure: RELEASE TRIGGER FINGER/A-1 PULLEY LEFT LONG;  Surgeon: Betha Loa, MD;  Location: [facility];  Service: Orthopedics;  Laterality: Left;   UMBILICAL HERNIA REPAIR  2002   Patient Active Problem List   Diagnosis Date Noted   Chronic nausea 08/20/2022   Sigmoid diverticulitis 08/17/2022   Chronic anticoagulation 07/16/2022   History of breast cancer 07/16/2022   Hypokalemia 05/27/2022   Normocytic anemia 05/26/2022   History of recurrent deep vein thrombosis (DVT) 05/26/2022   Pre-diabetes 05/23/2022   Pure hypercholesterolemia 05/23/2022   Osteoporosis of femur without pathological fracture 11/15/2021   Overweight (BMI 25.0-29.9) 08/15/2021   Port-A-Cath in place 09/09/2019   Malignant neoplasm of upper-outer quadrant of right breast in female, estrogen receptor positive (HCC) 08/20/2019   Breast cancer metastasized to axillary lymph node, right (HCC) 08/18/2019   Trigger middle finger of left hand 02/10/2019   Radicular leg pain 02/04/2018  H/O bilateral breast implants 12/24/2017   GERD (gastroesophageal reflux disease) 12/06/2015   Gluten intolerance 12/06/2015    PCP: Raliegh Ip, DO  REFERRING PROVIDER: Drake Leach, PA-C   REFERRING DIAG: Lumbar spondylosis   Rationale for Evaluation and Treatment: Rehabilitation  THERAPY DIAG:  Muscle weakness (generalized)  Other low back pain  ONSET DATE: "a couple months ago"  SUBJECTIVE:                                                                                                                                                                                            SUBJECTIVE STATEMENT: Patient reports that she has been having some sciatica as it woke her up last night.   PERTINENT HISTORY:  Osteoporosis, history of breast cancer, history of DVT, and anxiety  PAIN:  Are you having pain? Yes: NPRS scale: 2/10 Pain location: low back Pain description: intermittent aching and tired Aggravating factors: gets worse as the day goes on Relieving factors: "I just work through it"   PRECAUTIONS: Fall  RED FLAGS: None   WEIGHT BEARING RESTRICTIONS: No  FALLS:  Has patient fallen in last 6 months? Yes. Number of falls 1; approximately 1 week ago when she was walking in her back door  LIVING ENVIRONMENT: Lives with: lives with their spouse Lives in: House/apartment Stairs: Yes: Internal: 15 steps; on right going up and External: 2 steps; none; reciprocal pattern Has following equipment at home: None  OCCUPATION: sells insurance  PLOF: Independent  PATIENT GOALS: improved strength to reduce her risk of re injury  NEXT MD VISIT: 04/24/23  OBJECTIVE:  Note: Objective measures were completed at Evaluation unless otherwise noted.  DIAGNOSTIC FINDINGS: 01/21/23 lumbar MRI IMPRESSION: Mild multilevel degenerative change without significant stenosis.  COGNITION: Overall cognitive status: Within functional limits for tasks assessed     SENSATION: Patient reports no numbness or tingling since her initial injury  POSTURE: forward head and decreased lumbar lordosis  PALPATION: TTP: right lumbar paraspinals and L3-5 spinous processes   LUMBAR JOINT MOBILITY:  L1-3: WFL and nonpainful   L3-5: hypomobile and nonpainful   LUMBAR ROM:   AROM eval  Flexion 64; "pulling"   Extension 20; " it feels good"   Right lateral flexion   Left lateral flexion   Right rotation 25% limited  Left rotation  WFL    (Blank rows = not tested)  LOWER EXTREMITY ROM: WFL for activities assessed  LOWER EXTREMITY MMT:    MMT Right eval Left eval  Hip flexion 3+/5;  slight hip pain  3+/5  Hip extension    Hip abduction    Hip  adduction    Hip internal rotation    Hip external rotation    Knee flexion 4/5 4/5  Knee extension 4+/5 5/5  Ankle dorsiflexion 4/5 4/5  Ankle plantarflexion    Ankle inversion    Ankle eversion     (Blank rows = not tested)  GAIT: Assistive device utilized: None Level of assistance: Complete Independence Comments: no significant gait deviations observed  TODAY'S TREATMENT:                                                                                                                              DATE:                                     03/19/23 EXERCISE LOG  Exercise Repetitions and Resistance Comments  Supine piriformis stretch  3 x 30 seconds each   Supine clams  Green t-band x 25 reps    Bridge w/ t-band @ knees Green t-band x 25 reps    SL hip ABD 20 reps each    SLR w/ hip ABD 20 reps each    Seated HS stretch  3 x 30 seconds each    Rocker board  5 minutes   Tandem stance on stance 3 x 30 seconds each  Without UE support  Self STM with lacrosse ball     Blank cell = exercise not performed today                                    03/14/23 EXERCISE LOG  Exercise Repetitions and Resistance Comments  SLR  15 reps each  Added to HEP   Bridge  10 reps   Bridge with march  10 reps  Added to HEP   Supine hip ADD isometric  15 reps w/ 5 second hold  Added to HEP       Blank cell = exercise not performed today   PATIENT EDUCATION:  Education details: POC, healing, prognosis, referred pain, HEP, and goals for physical therapy Person educated: Patient Education method: Explanation Education comprehension: verbalized understanding  HOME EXERCISE PROGRAM:   ASSESSMENT:  CLINICAL IMPRESSION: Patient was introduced to multiple new interventions  for improved lumbar and lower extremity strength needed to complete her daily activities. She required minimal cueing with today's new interventions for proper exercise performance. She experienced no increase in her familiar symptoms with any of today's interventions. She reported feeling "much better" upon the conclusion of treatment. She continues to require skilled physical therapy to address her remaining impairments to return to her prior level of function.   OBJECTIVE IMPAIRMENTS: decreased strength, hypomobility, impaired tone, and pain.   ACTIVITY LIMITATIONS: standing  PARTICIPATION LIMITATIONS: community activity  PERSONAL FACTORS: Time since onset of injury/illness/exacerbation and 3+ comorbidities: Osteoporosis, history of breast cancer,  history of DVT, and anxiety  are also affecting patient's functional outcome.   REHAB POTENTIAL: Good  CLINICAL DECISION MAKING: Stable/uncomplicated  EVALUATION COMPLEXITY: Low   GOALS: Goals reviewed with patient? Yes  LONG TERM GOALS: Target date: 04/11/23  Patient will be independent with HEP.  Baseline:  Goal status: INITIAL  2.  Patient will be able to demonstrate improved bilateral hip flexor strength to at least 4-/5 for improved functional mobility.  Baseline:  Goal status: INITIAL  3.  Patient will be able to complete her daily activities without being limited by her familiar low back symptoms.  Baseline:  Goal status: INITIAL  4.  Patient will be able to demonstrate proper lifting mechanics to reduce her risk of re injury.  Baseline:  Goal status: INITIAL  PLAN:  PT FREQUENCY: 1-2x/week  PT DURATION: 4 weeks  PLANNED INTERVENTIONS: 97164- PT Re-evaluation, 97110-Therapeutic exercises, 97530- Therapeutic activity, 97112- Neuromuscular re-education, 97535- Self Care, 11914- Manual therapy, Patient/Family education, Balance training, Dry Needling, Joint mobilization, Spinal mobilization, Cryotherapy, and Moist  heat.  PLAN FOR NEXT SESSION: review HEP, lumbar and lower extremity strengthening, and manual therapy as needed   Granville Lewis, PT 03/19/2023, 10:40 AM

## 2023-03-21 ENCOUNTER — Ambulatory Visit: Payer: BC Managed Care – PPO

## 2023-03-21 DIAGNOSIS — M6281 Muscle weakness (generalized): Secondary | ICD-10-CM | POA: Diagnosis not present

## 2023-03-21 DIAGNOSIS — M47816 Spondylosis without myelopathy or radiculopathy, lumbar region: Secondary | ICD-10-CM | POA: Diagnosis not present

## 2023-03-21 DIAGNOSIS — M5459 Other low back pain: Secondary | ICD-10-CM | POA: Diagnosis not present

## 2023-03-21 NOTE — Therapy (Signed)
OUTPATIENT PHYSICAL THERAPY THORACOLUMBAR TREATMENT   Patient Name: Stephanie Brewer MRN: [id number] DOB:11/06/1965, 57 y.o., female Today's Date: 03/21/2023  END OF SESSION:  PT End of Session - 03/21/23 0803     Visit Number 3    Number of Visits 8    Date for PT Re-Evaluation 05/03/23    PT Start Time 0800    PT Stop Time 0843    PT Time Calculation (min) 43 min    Activity Tolerance Patient tolerated treatment well    Behavior During Therapy [facility] for tasks assessed/performed               Past Medical History:  Diagnosis Date   Anemia    Anxiety    Breast cancer (HCC)    Clotting disorder (HCC)    Displacement of breast implant 12/24/2017   Diverticulitis    Diverticulosis    DVT (deep venous thrombosis) (HCC)    left leg knee and ankle    GERD (gastroesophageal reflux disease)    History of kidney stones    Kidney stones    Pneumonia    Pre-diabetes    Past Surgical History:  Procedure Laterality Date   BREAST BIOPSY Left 08/15/2021   BREAST ENHANCEMENT SURGERY     Augmentation 2003   BREAST IMPLANT REMOVAL Bilateral 01/14/2020   Procedure: REMOVAL BREAST IMPLANTS;  Surgeon: Allena Napoleon, MD;  Location: [facility];  Service: Plastics;  Laterality: Bilateral;   BREAST LUMPECTOMY Right 01/2020   BREAST LUMPECTOMY WITH RADIOACTIVE SEED AND SENTINEL LYMPH NODE BIOPSY Right 01/14/2020   Procedure: Right breast seed localized lumpectomy with sentinel lymph node biopsy;  Surgeon: Emelia Loron, MD;  Location: [facility];  Service: General;  Laterality: Right;   BUNIONECTOMY Bilateral 2005   Right foot in 2006   LIPOSUCTION WITH LIPOFILLING Bilateral 11/29/2020   Procedure: LIPOSUCTION WITH LIPOFILLING FROM ABDOMEN TO BILATERAL BREASTS;  Surgeon: Allena Napoleon, MD;  Location: [facility];  Service: Plastics;  Laterality: Bilateral;   MASTOPEXY Left 11/29/2020   Procedure: LEFT MASTOPEXY;  Surgeon: Allena Napoleon, MD;  Location: [facility];  Service: Plastics;  Laterality: Left;   PORTACATH PLACEMENT N/A 09/01/2019   Procedure: INSERTION PORT-A-CATH WITH ULTRASOUND GUIDANCE;  Surgeon: Emelia Loron, MD;  Location: [facility];  Service: General;  Laterality: N/A;   PROCTOSCOPY N/A 08/17/2022   Procedure: RIGID PROCTOSCOPY;  Surgeon: Karie Soda, MD;  Location: [facility];  Service: General;  Laterality: N/A;   TRIGGER FINGER RELEASE Left 05/09/2020   Procedure: RELEASE TRIGGER FINGER/A-1 PULLEY LEFT LONG;  Surgeon: Betha Loa, MD;  Location: [facility];  Service: Orthopedics;  Laterality: Left;   UMBILICAL HERNIA REPAIR  2002   Patient Active Problem List   Diagnosis Date Noted   Chronic nausea 08/20/2022   Sigmoid diverticulitis 08/17/2022   Chronic anticoagulation 07/16/2022   History of breast cancer 07/16/2022   Hypokalemia 05/27/2022   Normocytic anemia 05/26/2022   History of recurrent deep vein thrombosis (DVT) 05/26/2022   Pre-diabetes 05/23/2022   Pure hypercholesterolemia 05/23/2022   Osteoporosis of femur without pathological fracture 11/15/2021   Overweight (BMI 25.0-29.9) 08/15/2021   Port-A-Cath in place 09/09/2019   Malignant neoplasm of upper-outer quadrant of right breast in female, estrogen receptor positive (HCC) 08/20/2019   Breast cancer metastasized to axillary lymph node, right (HCC) 08/18/2019   Trigger middle finger of left hand 02/10/2019   Radicular leg pain 02/04/2018  H/O bilateral breast implants 12/24/2017   GERD (gastroesophageal reflux disease) 12/06/2015   Gluten intolerance 12/06/2015    PCP: Raliegh Ip, DO  REFERRING PROVIDER: Drake Leach, PA-C   REFERRING DIAG: Lumbar spondylosis   Rationale for Evaluation and Treatment: Rehabilitation  THERAPY DIAG:  Muscle weakness (generalized)  Other low back pain  ONSET DATE: "a couple months ago"  SUBJECTIVE:                                                                                                                                                                                            SUBJECTIVE STATEMENT: Patient reports that she feels good today. She was a little sore after her last appointment, but she felt better.   PERTINENT HISTORY:  Osteoporosis, history of breast cancer, history of DVT, and anxiety  PAIN:  Are you having pain? Yes: NPRS scale: no pain score provided/10 Pain location: low back Pain description: intermittent aching and tired Aggravating factors: gets worse as the day goes on Relieving factors: "I just work through it"   PRECAUTIONS: Fall  RED FLAGS: None   WEIGHT BEARING RESTRICTIONS: No  FALLS:  Has patient fallen in last 6 months? Yes. Number of falls 1; approximately 1 week ago when she was walking in her back door  LIVING ENVIRONMENT: Lives with: lives with their spouse Lives in: House/apartment Stairs: Yes: Internal: 15 steps; on right going up and External: 2 steps; none; reciprocal pattern Has following equipment at home: None  OCCUPATION: sells insurance  PLOF: Independent  PATIENT GOALS: improved strength to reduce her risk of re injury  NEXT MD VISIT: 04/24/23  OBJECTIVE:  Note: Objective measures were completed at Evaluation unless otherwise noted.  DIAGNOSTIC FINDINGS: 01/21/23 lumbar MRI IMPRESSION: Mild multilevel degenerative change without significant stenosis.  COGNITION: Overall cognitive status: Within functional limits for tasks assessed     SENSATION: Patient reports no numbness or tingling since her initial injury  POSTURE: forward head and decreased lumbar lordosis  PALPATION: TTP: right lumbar paraspinals and L3-5 spinous processes   LUMBAR JOINT MOBILITY:  L1-3: WFL and nonpainful   L3-5: hypomobile and nonpainful   LUMBAR ROM:   AROM eval  Flexion 64; "pulling"   Extension 20; " it feels good"   Right lateral flexion   Left  lateral flexion   Right rotation 25% limited  Left rotation WFL    (Blank rows = not tested)  LOWER EXTREMITY ROM: WFL for activities assessed  LOWER EXTREMITY MMT:    MMT Right eval Left eval  Hip flexion 3+/5;  slight hip pain  3+/5  Hip extension  Hip abduction    Hip adduction    Hip internal rotation    Hip external rotation    Knee flexion 4/5 4/5  Knee extension 4+/5 5/5  Ankle dorsiflexion 4/5 4/5  Ankle plantarflexion    Ankle inversion    Ankle eversion     (Blank rows = not tested)  GAIT: Assistive device utilized: None Level of assistance: Complete Independence Comments: no significant gait deviations observed  TODAY'S TREATMENT:                                                                                                                              DATE:                                     03/21/23 EXERCISE LOG  Exercise Repetitions and Resistance Comments  Standing HS stretch  3 x 30 seconds each    Eccentric heel tap  6" step x 20 reps each    Rocker board  2 minutes   Marching on BOSU Ball up x 3 minutes Intermittent UE support  BOSU lateral controls  3 minutes  Without UE support  Resisted pull down  Blue XTS x 3 minutes   Multifidus rotation  Green t-band x 30 reps each    Tandem stance on foam  3 x 30 seconds each  Without UE support   Standing donkey kick  3# x 25 reps each     Blank cell = exercise not performed today                                    03/19/23 EXERCISE LOG  Exercise Repetitions and Resistance Comments  Supine piriformis stretch  3 x 30 seconds each   Supine clams  Green t-band x 25 reps    Bridge w/ t-band @ knees Green t-band x 25 reps    SL hip ABD 20 reps each    SLR w/ hip ABD 20 reps each    Seated HS stretch  3 x 30 seconds each    Rocker board  5 minutes   Tandem stance on stance 3 x 30 seconds each  Without UE support  Self STM with lacrosse ball     Blank cell = exercise not performed today                                     03/14/23 EXERCISE LOG  Exercise Repetitions and Resistance Comments  SLR  15 reps each  Added to HEP   Bridge  10 reps   Bridge with march  10 reps  Added to HEP   Supine hip ADD isometric  15 reps w/ 5 second hold  Added  to HEP       Blank cell = exercise not performed today   PATIENT EDUCATION:  Education details: POC, healing, prognosis, referred pain, HEP, and goals for physical therapy Person educated: Patient Education method: Explanation Education comprehension: verbalized understanding  HOME EXERCISE PROGRAM:   ASSESSMENT:  CLINICAL IMPRESSION: Patient was introduced to multiple new standing interventions for improved lumbar and lower extremity strength and stability. She required minimal cueing with standing donkey kicks to limit trunk flexion to isolate posterior chain engagement. She experienced no increase in pain or discomfort with any of today's interventions. She reported feeling good upon the conclusion of treatment. She continues to require skilled physical therapy to address her remaining impairments to return to her prior level of function.   OBJECTIVE IMPAIRMENTS: decreased strength, hypomobility, impaired tone, and pain.   ACTIVITY LIMITATIONS: standing  PARTICIPATION LIMITATIONS: community activity  PERSONAL FACTORS: Time since onset of injury/illness/exacerbation and 3+ comorbidities: Osteoporosis, history of breast cancer, history of DVT, and anxiety  are also affecting patient's functional outcome.   REHAB POTENTIAL: Good  CLINICAL DECISION MAKING: Stable/uncomplicated  EVALUATION COMPLEXITY: Low   GOALS: Goals reviewed with patient? Yes  LONG TERM GOALS: Target date: 04/11/23  Patient will be independent with HEP.  Baseline:  Goal status: INITIAL  2.  Patient will be able to demonstrate improved bilateral hip flexor strength to at least 4-/5 for improved functional mobility.  Baseline:  Goal status: INITIAL  3.  Patient  will be able to complete her daily activities without being limited by her familiar low back symptoms.  Baseline:  Goal status: INITIAL  4.  Patient will be able to demonstrate proper lifting mechanics to reduce her risk of re injury.  Baseline:  Goal status: INITIAL  PLAN:  PT FREQUENCY: 1-2x/week  PT DURATION: 4 weeks  PLANNED INTERVENTIONS: 97164- PT Re-evaluation, 97110-Therapeutic exercises, 97530- Therapeutic activity, 97112- Neuromuscular re-education, 97535- Self Care, 72536- Manual therapy, Patient/Family education, Balance training, Dry Needling, Joint mobilization, Spinal mobilization, Cryotherapy, and Moist heat.  PLAN FOR NEXT SESSION: review HEP, lumbar and lower extremity strengthening, and manual therapy as needed   Granville Lewis, PT 03/21/2023, 12:25 PM

## 2023-03-22 DIAGNOSIS — C50911 Malignant neoplasm of unspecified site of right female breast: Secondary | ICD-10-CM | POA: Diagnosis not present

## 2023-03-22 DIAGNOSIS — Z9012 Acquired absence of left breast and nipple: Secondary | ICD-10-CM | POA: Diagnosis not present

## 2023-03-25 ENCOUNTER — Encounter: Payer: BC Managed Care – PPO | Admitting: *Deleted

## 2023-03-29 ENCOUNTER — Ambulatory Visit: Payer: BC Managed Care – PPO

## 2023-03-29 DIAGNOSIS — M5459 Other low back pain: Secondary | ICD-10-CM | POA: Diagnosis not present

## 2023-03-29 DIAGNOSIS — M6281 Muscle weakness (generalized): Secondary | ICD-10-CM

## 2023-03-29 DIAGNOSIS — M47816 Spondylosis without myelopathy or radiculopathy, lumbar region: Secondary | ICD-10-CM | POA: Diagnosis not present

## 2023-03-29 NOTE — Therapy (Signed)
OUTPATIENT PHYSICAL THERAPY THORACOLUMBAR TREATMENT   Patient Name: Stephanie Brewer MRN: [id number] DOB:12-Jun-1965, 57 y.o., female Today's Date: 03/29/2023  END OF SESSION:  PT End of Session - 03/29/23 0803     Visit Number 4    Number of Visits 8    Date for PT Re-Evaluation 05/03/23    PT Start Time 0800    PT Stop Time 0843    PT Time Calculation (min) 43 min    Activity Tolerance Patient tolerated treatment well    Behavior During Therapy [facility] for tasks assessed/performed                Past Medical History:  Diagnosis Date   Anemia    Anxiety    Breast cancer (HCC)    Clotting disorder (HCC)    Displacement of breast implant 12/24/2017   Diverticulitis    Diverticulosis    DVT (deep venous thrombosis) (HCC)    left leg knee and ankle    GERD (gastroesophageal reflux disease)    History of kidney stones    Kidney stones    Pneumonia    Pre-diabetes    Past Surgical History:  Procedure Laterality Date   BREAST BIOPSY Left 08/15/2021   BREAST ENHANCEMENT SURGERY     Augmentation 2003   BREAST IMPLANT REMOVAL Bilateral 01/14/2020   Procedure: REMOVAL BREAST IMPLANTS;  Surgeon: Allena Napoleon, MD;  Location: [facility];  Service: Plastics;  Laterality: Bilateral;   BREAST LUMPECTOMY Right 01/2020   BREAST LUMPECTOMY WITH RADIOACTIVE SEED AND SENTINEL LYMPH NODE BIOPSY Right 01/14/2020   Procedure: Right breast seed localized lumpectomy with sentinel lymph node biopsy;  Surgeon: Emelia Loron, MD;  Location: [facility];  Service: General;  Laterality: Right;   BUNIONECTOMY Bilateral 2005   Right foot in 2006   LIPOSUCTION WITH LIPOFILLING Bilateral 11/29/2020   Procedure: LIPOSUCTION WITH LIPOFILLING FROM ABDOMEN TO BILATERAL BREASTS;  Surgeon: Allena Napoleon, MD;  Location: [facility];  Service: Plastics;  Laterality: Bilateral;   MASTOPEXY Left 11/29/2020   Procedure: LEFT MASTOPEXY;  Surgeon: Allena Napoleon, MD;  Location: [facility];  Service: Plastics;  Laterality: Left;   PORTACATH PLACEMENT N/A 09/01/2019   Procedure: INSERTION PORT-A-CATH WITH ULTRASOUND GUIDANCE;  Surgeon: Emelia Loron, MD;  Location: [facility];  Service: General;  Laterality: N/A;   PROCTOSCOPY N/A 08/17/2022   Procedure: RIGID PROCTOSCOPY;  Surgeon: Karie Soda, MD;  Location: [facility];  Service: General;  Laterality: N/A;   TRIGGER FINGER RELEASE Left 05/09/2020   Procedure: RELEASE TRIGGER FINGER/A-1 PULLEY LEFT LONG;  Surgeon: Betha Loa, MD;  Location: [facility];  Service: Orthopedics;  Laterality: Left;   UMBILICAL HERNIA REPAIR  2002   Patient Active Problem List   Diagnosis Date Noted   Chronic nausea 08/20/2022   Sigmoid diverticulitis 08/17/2022   Chronic anticoagulation 07/16/2022   History of breast cancer 07/16/2022   Hypokalemia 05/27/2022   Normocytic anemia 05/26/2022   History of recurrent deep vein thrombosis (DVT) 05/26/2022   Pre-diabetes 05/23/2022   Pure hypercholesterolemia 05/23/2022   Osteoporosis of femur without pathological fracture 11/15/2021   Overweight (BMI 25.0-29.9) 08/15/2021   Port-A-Cath in place 09/09/2019   Malignant neoplasm of upper-outer quadrant of right breast in female, estrogen receptor positive (HCC) 08/20/2019   Breast cancer metastasized to axillary lymph node, right (HCC) 08/18/2019   Trigger middle finger of left hand 02/10/2019   Radicular leg pain  02/04/2018   H/O bilateral breast implants 12/24/2017   GERD (gastroesophageal reflux disease) 12/06/2015   Gluten intolerance 12/06/2015    PCP: Raliegh Ip, DO  REFERRING PROVIDER: Drake Leach, PA-C   REFERRING DIAG: Lumbar spondylosis   Rationale for Evaluation and Treatment: Rehabilitation  THERAPY DIAG:  Muscle weakness (generalized)  Other low back pain  ONSET DATE: "a couple months ago"  SUBJECTIVE:                                                                                                                                                                                            SUBJECTIVE STATEMENT: Patient reports that her left hip has been hurting a little bit.   PERTINENT HISTORY:  Osteoporosis, history of breast cancer, history of DVT, and anxiety  PAIN:  Are you having pain? Yes: NPRS scale: 2/10 Pain location: low back Pain description: intermittent aching and tired Aggravating factors: gets worse as the day goes on Relieving factors: "I just work through it"   PRECAUTIONS: Fall  RED FLAGS: None   WEIGHT BEARING RESTRICTIONS: No  FALLS:  Has patient fallen in last 6 months? Yes. Number of falls 1; approximately 1 week ago when she was walking in her back door  LIVING ENVIRONMENT: Lives with: lives with their spouse Lives in: House/apartment Stairs: Yes: Internal: 15 steps; on right going up and External: 2 steps; none; reciprocal pattern Has following equipment at home: None  OCCUPATION: sells insurance  PLOF: Independent  PATIENT GOALS: improved strength to reduce her risk of re injury  NEXT MD VISIT: 04/24/23  OBJECTIVE:  Note: Objective measures were completed at Evaluation unless otherwise noted.  DIAGNOSTIC FINDINGS: 01/21/23 lumbar MRI IMPRESSION: Mild multilevel degenerative change without significant stenosis.  COGNITION: Overall cognitive status: Within functional limits for tasks assessed     SENSATION: Patient reports no numbness or tingling since her initial injury  POSTURE: forward head and decreased lumbar lordosis  PALPATION: TTP: right lumbar paraspinals and L3-5 spinous processes   LUMBAR JOINT MOBILITY:  L1-3: WFL and nonpainful   L3-5: hypomobile and nonpainful   LUMBAR ROM:   AROM eval  Flexion 64; "pulling"   Extension 20; " it feels good"   Right lateral flexion   Left lateral flexion   Right rotation 25% limited  Left rotation WFL     (Blank rows = not tested)  LOWER EXTREMITY ROM: WFL for activities assessed  LOWER EXTREMITY MMT:    MMT Right eval Left eval  Hip flexion 3+/5;  slight hip pain  3+/5  Hip extension    Hip abduction    Hip adduction  Hip internal rotation    Hip external rotation    Knee flexion 4/5 4/5  Knee extension 4+/5 5/5  Ankle dorsiflexion 4/5 4/5  Ankle plantarflexion    Ankle inversion    Ankle eversion     (Blank rows = not tested)  GAIT: Assistive device utilized: None Level of assistance: Complete Independence Comments: no significant gait deviations observed  TODAY'S TREATMENT:                                                                                                                              DATE:                                     03/29/23 EXERCISE LOG  Exercise Repetitions and Resistance Comments  Resisted pull down  Blue XTS x 20 reps   BOSU squats  3 minutes Without UE support   BOSU circles  2.5 minutes  Intermittent UE support; CW and CCW  Multifidus rotation  Green t-band x 30 reps each    Cybex knee flexion  40# x 30 reps    Cybex knee extension 20# x 30 reps    Rocker board  4 minutes   Standing HS stretch  3 x 30 seconds each    Piriformis stretch  3 x 30 seconds each    Figure 4 stretch  3 x 30 seconds each     Blank cell = exercise not performed today                                    03/21/23 EXERCISE LOG  Exercise Repetitions and Resistance Comments  Standing HS stretch  3 x 30 seconds each    Eccentric heel tap  6" step x 20 reps each    Rocker board  2 minutes   Marching on BOSU Ball up x 3 minutes Intermittent UE support  BOSU lateral controls  3 minutes  Without UE support  Resisted pull down  Blue XTS x 3 minutes   Multifidus rotation  Green t-band x 30 reps each    Tandem stance on foam  3 x 30 seconds each  Without UE support   Standing donkey kick  3# x 25 reps each     Blank cell = exercise not performed today                                     03/19/23 EXERCISE LOG  Exercise Repetitions and Resistance Comments  Supine piriformis stretch  3 x 30 seconds each   Supine clams  Green t-band x 25 reps    Bridge w/ t-band @ knees Green t-band x 25 reps    SL hip ABD  20 reps each    SLR w/ hip ABD 20 reps each    Seated HS stretch  3 x 30 seconds each    Rocker board  5 minutes   Tandem stance on stance 3 x 30 seconds each  Without UE support  Self STM with lacrosse ball     Blank cell = exercise not performed today   PATIENT EDUCATION:  Education details: POC, healing, prognosis, referred pain, HEP, and goals for physical therapy Person educated: Patient Education method: Explanation Education comprehension: verbalized understanding  HOME EXERCISE PROGRAM:   ASSESSMENT:  CLINICAL IMPRESSION: Patient was introduced to multiple new and familiar interventions for improved lumbar and lower extremity strength and stability with moderate difficulty. She required minimal cueing with today's new interventions for proper exercise performance. She experienced no increase in her familiar symptoms with any of today's interventions. She reported feeling good upon the conclusion of treatment. She continues to require skilled physical therapy to address her remaining impairments to return to her prior level of function.   OBJECTIVE IMPAIRMENTS: decreased strength, hypomobility, impaired tone, and pain.   ACTIVITY LIMITATIONS: standing  PARTICIPATION LIMITATIONS: community activity  PERSONAL FACTORS: Time since onset of injury/illness/exacerbation and 3+ comorbidities: Osteoporosis, history of breast cancer, history of DVT, and anxiety  are also affecting patient's functional outcome.   REHAB POTENTIAL: Good  CLINICAL DECISION MAKING: Stable/uncomplicated  EVALUATION COMPLEXITY: Low   GOALS: Goals reviewed with patient? Yes  LONG TERM GOALS: Target date: 04/11/23  Patient will be independent with HEP.   Baseline:  Goal status: INITIAL  2.  Patient will be able to demonstrate improved bilateral hip flexor strength to at least 4-/5 for improved functional mobility.  Baseline:  Goal status: INITIAL  3.  Patient will be able to complete her daily activities without being limited by her familiar low back symptoms.  Baseline:  Goal status: INITIAL  4.  Patient will be able to demonstrate proper lifting mechanics to reduce her risk of re injury.  Baseline:  Goal status: INITIAL  PLAN:  PT FREQUENCY: 1-2x/week  PT DURATION: 4 weeks  PLANNED INTERVENTIONS: 97164- PT Re-evaluation, 97110-Therapeutic exercises, 97530- Therapeutic activity, 97112- Neuromuscular re-education, 97535- Self Care, 16109- Manual therapy, Patient/Family education, Balance training, Dry Needling, Joint mobilization, Spinal mobilization, Cryotherapy, and Moist heat.  PLAN FOR NEXT SESSION: review HEP, lumbar and lower extremity strengthening, and manual therapy as needed   Granville Lewis, PT 03/29/2023, 9:08 AM

## 2023-04-01 ENCOUNTER — Ambulatory Visit: Payer: BC Managed Care – PPO

## 2023-04-01 DIAGNOSIS — M47816 Spondylosis without myelopathy or radiculopathy, lumbar region: Secondary | ICD-10-CM | POA: Diagnosis not present

## 2023-04-01 DIAGNOSIS — M6281 Muscle weakness (generalized): Secondary | ICD-10-CM

## 2023-04-01 DIAGNOSIS — M5459 Other low back pain: Secondary | ICD-10-CM | POA: Diagnosis not present

## 2023-04-01 NOTE — Therapy (Signed)
OUTPATIENT PHYSICAL THERAPY THORACOLUMBAR TREATMENT   Patient Name: Stephanie Brewer MRN: [id number] DOB:1966-02-01, 57 y.o., female Today's Date: 04/01/2023  END OF SESSION:  PT End of Session - 04/01/23 0810     Visit Number 5    Number of Visits 8    Date for PT Re-Evaluation 05/03/23    PT Start Time 0800    PT Stop Time 0842    PT Time Calculation (min) 42 min    Activity Tolerance Patient tolerated treatment well    Behavior During Therapy [facility] for tasks assessed/performed                 Past Medical History:  Diagnosis Date   Anemia    Anxiety    Breast cancer (HCC)    Clotting disorder (HCC)    Displacement of breast implant 12/24/2017   Diverticulitis    Diverticulosis    DVT (deep venous thrombosis) (HCC)    left leg knee and ankle    GERD (gastroesophageal reflux disease)    History of kidney stones    Kidney stones    Pneumonia    Pre-diabetes    Past Surgical History:  Procedure Laterality Date   BREAST BIOPSY Left 08/15/2021   BREAST ENHANCEMENT SURGERY     Augmentation 2003   BREAST IMPLANT REMOVAL Bilateral 01/14/2020   Procedure: REMOVAL BREAST IMPLANTS;  Surgeon: Allena Napoleon, MD;  Location: [facility];  Service: Plastics;  Laterality: Bilateral;   BREAST LUMPECTOMY Right 01/2020   BREAST LUMPECTOMY WITH RADIOACTIVE SEED AND SENTINEL LYMPH NODE BIOPSY Right 01/14/2020   Procedure: Right breast seed localized lumpectomy with sentinel lymph node biopsy;  Surgeon: Emelia Loron, MD;  Location: [facility];  Service: General;  Laterality: Right;   BUNIONECTOMY Bilateral 2005   Right foot in 2006   LIPOSUCTION WITH LIPOFILLING Bilateral 11/29/2020   Procedure: LIPOSUCTION WITH LIPOFILLING FROM ABDOMEN TO BILATERAL BREASTS;  Surgeon: Allena Napoleon, MD;  Location: [facility];  Service: Plastics;  Laterality: Bilateral;   MASTOPEXY Left 11/29/2020   Procedure: LEFT MASTOPEXY;  Surgeon: Allena Napoleon, MD;  Location: [facility];  Service: Plastics;  Laterality: Left;   PORTACATH PLACEMENT N/A 09/01/2019   Procedure: INSERTION PORT-A-CATH WITH ULTRASOUND GUIDANCE;  Surgeon: Emelia Loron, MD;  Location: [facility];  Service: General;  Laterality: N/A;   PROCTOSCOPY N/A 08/17/2022   Procedure: RIGID PROCTOSCOPY;  Surgeon: Karie Soda, MD;  Location: [facility];  Service: General;  Laterality: N/A;   TRIGGER FINGER RELEASE Left 05/09/2020   Procedure: RELEASE TRIGGER FINGER/A-1 PULLEY LEFT LONG;  Surgeon: Betha Loa, MD;  Location: [facility];  Service: Orthopedics;  Laterality: Left;   UMBILICAL HERNIA REPAIR  2002   Patient Active Problem List   Diagnosis Date Noted   Chronic nausea 08/20/2022   Sigmoid diverticulitis 08/17/2022   Chronic anticoagulation 07/16/2022   History of breast cancer 07/16/2022   Hypokalemia 05/27/2022   Normocytic anemia 05/26/2022   History of recurrent deep vein thrombosis (DVT) 05/26/2022   Pre-diabetes 05/23/2022   Pure hypercholesterolemia 05/23/2022   Osteoporosis of femur without pathological fracture 11/15/2021   Overweight (BMI 25.0-29.9) 08/15/2021   Port-A-Cath in place 09/09/2019   Malignant neoplasm of upper-outer quadrant of right breast in female, estrogen receptor positive (HCC) 08/20/2019   Breast cancer metastasized to axillary lymph node, right (HCC) 08/18/2019   Trigger middle finger of left hand 02/10/2019   Radicular leg  pain 02/04/2018   H/O bilateral breast implants 12/24/2017   GERD (gastroesophageal reflux disease) 12/06/2015   Gluten intolerance 12/06/2015    PCP: Raliegh Ip, DO  REFERRING PROVIDER: Drake Leach, PA-C   REFERRING DIAG: Lumbar spondylosis   Rationale for Evaluation and Treatment: Rehabilitation  THERAPY DIAG:  Muscle weakness (generalized)  Other low back pain  ONSET DATE: "a couple months ago"  SUBJECTIVE:                                                                                                                                                                                            SUBJECTIVE STATEMENT: Patient reports that she is not hurting today.    PERTINENT HISTORY:  Osteoporosis, history of breast cancer, history of DVT, and anxiety  PAIN:  Are you having pain? Yes: NPRS scale: 0/10 Pain location: low back Pain description: intermittent aching and tired Aggravating factors: gets worse as the day goes on Relieving factors: "I just work through it"   PRECAUTIONS: Fall  RED FLAGS: None   WEIGHT BEARING RESTRICTIONS: No  FALLS:  Has patient fallen in last 6 months? Yes. Number of falls 1; approximately 1 week ago when she was walking in her back door  LIVING ENVIRONMENT: Lives with: lives with their spouse Lives in: House/apartment Stairs: Yes: Internal: 15 steps; on right going up and External: 2 steps; none; reciprocal pattern Has following equipment at home: None  OCCUPATION: sells insurance  PLOF: Independent  PATIENT GOALS: improved strength to reduce her risk of re injury  NEXT MD VISIT: 04/24/23  OBJECTIVE:  Note: Objective measures were completed at Evaluation unless otherwise noted.  DIAGNOSTIC FINDINGS: 01/21/23 lumbar MRI IMPRESSION: Mild multilevel degenerative change without significant stenosis.  COGNITION: Overall cognitive status: Within functional limits for tasks assessed     SENSATION: Patient reports no numbness or tingling since her initial injury  POSTURE: forward head and decreased lumbar lordosis  PALPATION: TTP: right lumbar paraspinals and L3-5 spinous processes   LUMBAR JOINT MOBILITY:  L1-3: WFL and nonpainful   L3-5: hypomobile and nonpainful   LUMBAR ROM:   AROM eval  Flexion 64; "pulling"   Extension 20; " it feels good"   Right lateral flexion   Left lateral flexion   Right rotation 25% limited  Left rotation WFL    (Blank rows = not  tested)  LOWER EXTREMITY ROM: WFL for activities assessed  LOWER EXTREMITY MMT:    MMT Right eval Left eval  Hip flexion 3+/5;  slight hip pain  3+/5  Hip extension    Hip abduction    Hip adduction  Hip internal rotation    Hip external rotation    Knee flexion 4/5 4/5  Knee extension 4+/5 5/5  Ankle dorsiflexion 4/5 4/5  Ankle plantarflexion    Ankle inversion    Ankle eversion     (Blank rows = not tested)  GAIT: Assistive device utilized: None Level of assistance: Complete Independence Comments: no significant gait deviations observed  TODAY'S TREATMENT:                                                                                                                              DATE:                                     04/01/23 EXERCISE LOG  Exercise Repetitions and Resistance Comments  Recumbent bike  L5 x 10 minutes   Cybex knee extension  20# x 30 reps    Cybex knee flexion  40# x 30 reps    Wood chops  Blue XTS x 20 reps each    Chair taps  20 reps  Added to HEP  Static stance on [employer] up x 3 minutes  Without UE support   Resisted pull down with rotation Green t-band x 30 reps each  Added to HEP    Blank cell = exercise not performed today                                    03/29/23 EXERCISE LOG  Exercise Repetitions and Resistance Comments  Resisted pull down  Blue XTS x 20 reps   BOSU squats  3 minutes Without UE support   BOSU circles  2.5 minutes  Intermittent UE support; CW and CCW  Multifidus rotation  Green t-band x 30 reps each    Cybex knee flexion  40# x 30 reps    Cybex knee extension 20# x 30 reps    Rocker board  4 minutes   Standing HS stretch  3 x 30 seconds each    Piriformis stretch  3 x 30 seconds each    Figure 4 stretch  3 x 30 seconds each     Blank cell = exercise not performed today                                    03/21/23 EXERCISE LOG  Exercise Repetitions and Resistance Comments  Standing HS stretch  3 x 30 seconds  each    Eccentric heel tap  6" step x 20 reps each    Rocker board  2 minutes   Marching on BOSU Ball up x 3 minutes Intermittent UE support  BOSU lateral controls  3 minutes  Without UE support  Resisted pull down  Blue XTS x 3 minutes   Multifidus rotation  Green t-band x 30 reps each    Tandem stance on foam  3 x 30 seconds each  Without UE support   Standing donkey kick  3# x 25 reps each     Blank cell = exercise not performed today   PATIENT EDUCATION:  Education details: POC, healing, prognosis, referred pain, HEP, and goals for physical therapy Person educated: Patient Education method: Explanation Education comprehension: verbalized understanding  HOME EXERCISE PROGRAM:   ASSESSMENT:  CLINICAL IMPRESSION: Patient was progressed with multiple new interventions for improved lumbar and lower extremity strength and stability. She required minimal cueing with resisted wood chops to facilitate lumbar rotation to promote latissimus dorsi engagement. She experienced no increase in pain or discomfort with any of today's interventions. She reported feeling a little tired upon the conclusion of treatment. She continues to require skilled physical therapy to address her remaining impairments to return to her prior level of function.   OBJECTIVE IMPAIRMENTS: decreased strength, hypomobility, impaired tone, and pain.   ACTIVITY LIMITATIONS: standing  PARTICIPATION LIMITATIONS: community activity  PERSONAL FACTORS: Time since onset of injury/illness/exacerbation and 3+ comorbidities: Osteoporosis, history of breast cancer, history of DVT, and anxiety  are also affecting patient's functional outcome.   REHAB POTENTIAL: Good  CLINICAL DECISION MAKING: Stable/uncomplicated  EVALUATION COMPLEXITY: Low   GOALS: Goals reviewed with patient? Yes  LONG TERM GOALS: Target date: 04/11/23  Patient will be independent with HEP.  Baseline:  Goal status: INITIAL  2.  Patient will be able  to demonstrate improved bilateral hip flexor strength to at least 4-/5 for improved functional mobility.  Baseline:  Goal status: INITIAL  3.  Patient will be able to complete her daily activities without being limited by her familiar low back symptoms.  Baseline:  Goal status: MET  4.  Patient will be able to demonstrate proper lifting mechanics to reduce her risk of re injury.  Baseline:  Goal status: INITIAL  PLAN:  PT FREQUENCY: 1-2x/week  PT DURATION: 4 weeks  PLANNED INTERVENTIONS: 97164- PT Re-evaluation, 97110-Therapeutic exercises, 97530- Therapeutic activity, 97112- Neuromuscular re-education, 97535- Self Care, 40347- Manual therapy, Patient/Family education, Balance training, Dry Needling, Joint mobilization, Spinal mobilization, Cryotherapy, and Moist heat.  PLAN FOR NEXT SESSION: review HEP, lumbar and lower extremity strengthening, and manual therapy as needed   Granville Lewis, PT 04/01/2023, 8:48 AM

## 2023-04-04 ENCOUNTER — Ambulatory Visit: Payer: BC Managed Care – PPO | Attending: Hematology and Oncology

## 2023-04-04 DIAGNOSIS — M6281 Muscle weakness (generalized): Secondary | ICD-10-CM | POA: Diagnosis not present

## 2023-04-04 DIAGNOSIS — M5459 Other low back pain: Secondary | ICD-10-CM | POA: Diagnosis not present

## 2023-04-04 NOTE — Therapy (Signed)
 OUTPATIENT PHYSICAL THERAPY THORACOLUMBAR TREATMENT   Patient Name: Stephanie Brewer MRN: [id number] DOB:[birth year], 58 y.o., female Today's Date: 04/04/2023  END OF SESSION:  PT End of Session - 04/04/23 0807     Visit Number 6    Number of Visits 8    Date for PT Re-Evaluation 05/03/23    PT Start Time 0805    PT Stop Time 0844    PT Time Calculation (min) 39 min    Activity Tolerance Patient tolerated treatment well    Behavior During Therapy [facility] for tasks assessed/performed                 Past Medical History:  Diagnosis Date   Anemia    Anxiety    Breast cancer (HCC)    Clotting disorder (HCC)    Displacement of breast implant 12/24/2017   Diverticulitis    Diverticulosis    DVT (deep venous thrombosis) (HCC)    left leg knee and ankle    GERD (gastroesophageal reflux disease)    History of kidney stones    Kidney stones    Pneumonia    Pre-diabetes    Past Surgical History:  Procedure Laterality Date   BREAST BIOPSY Left 08/15/2021   BREAST ENHANCEMENT SURGERY     Augmentation 2003   BREAST IMPLANT REMOVAL Bilateral 01/14/2020   Procedure: REMOVAL BREAST IMPLANTS;  Surgeon: Elisabeth Craig RAMAN, MD;  Location: [facility];  Service: Plastics;  Laterality: Bilateral;   BREAST LUMPECTOMY Right 01/2020   BREAST LUMPECTOMY WITH RADIOACTIVE SEED AND SENTINEL LYMPH NODE BIOPSY Right 01/14/2020   Procedure: Right breast seed localized lumpectomy with sentinel lymph node biopsy;  Surgeon: Ebbie Cough, MD;  Location: [facility];  Service: General;  Laterality: Right;   BUNIONECTOMY Bilateral 2005   Right foot in 2006   LIPOSUCTION WITH LIPOFILLING Bilateral 11/29/2020   Procedure: LIPOSUCTION WITH LIPOFILLING FROM ABDOMEN TO BILATERAL BREASTS;  Surgeon: Elisabeth Craig RAMAN, MD;  Location: [facility];  Service: Plastics;  Laterality: Bilateral;   MASTOPEXY Left 11/29/2020   Procedure: LEFT MASTOPEXY;  Surgeon: Elisabeth Craig RAMAN, MD;  Location: [facility];  Service: Plastics;  Laterality: Left;   PORTACATH PLACEMENT N/A 09/01/2019   Procedure: INSERTION PORT-A-CATH WITH ULTRASOUND GUIDANCE;  Surgeon: Ebbie Cough, MD;  Location: [facility];  Service: General;  Laterality: N/A;   PROCTOSCOPY N/A 08/17/2022   Procedure: RIGID PROCTOSCOPY;  Surgeon: Sheldon Standing, MD;  Location: [facility];  Service: General;  Laterality: N/A;   TRIGGER FINGER RELEASE Left 05/09/2020   Procedure: RELEASE TRIGGER FINGER/A-1 PULLEY LEFT LONG;  Surgeon: Murrell Drivers, MD;  Location: [facility];  Service: Orthopedics;  Laterality: Left;   UMBILICAL HERNIA REPAIR  2002   Patient Active Problem List   Diagnosis Date Noted   Chronic nausea 08/20/2022   Sigmoid diverticulitis 08/17/2022   Chronic anticoagulation 07/16/2022   History of breast cancer 07/16/2022   Hypokalemia 05/27/2022   Normocytic anemia 05/26/2022   History of recurrent deep vein thrombosis (DVT) 05/26/2022   Pre-diabetes 05/23/2022   Pure hypercholesterolemia 05/23/2022   Osteoporosis of femur without pathological fracture 11/15/2021   Overweight (BMI 25.0-29.9) 08/15/2021   Port-A-Cath in place 09/09/2019   Malignant neoplasm of upper-outer quadrant of right breast in female, estrogen receptor positive (HCC) 08/20/2019   Breast cancer metastasized to axillary lymph node, right (HCC) 08/18/2019   Trigger middle finger of left hand 02/10/2019   Radicular leg  pain 02/04/2018   H/O bilateral breast implants 12/24/2017   GERD (gastroesophageal reflux disease) 12/06/2015   Gluten intolerance 12/06/2015    PCP: Jolinda Norene HERO, DO  REFERRING PROVIDER: Hilma Hastings, PA-C   REFERRING DIAG: Lumbar spondylosis   Rationale for Evaluation and Treatment: Rehabilitation  THERAPY DIAG:  Muscle weakness (generalized)  Other low back pain  ONSET DATE: a couple months ago  SUBJECTIVE:                                                                                                                                                                                            SUBJECTIVE STATEMENT: Patient denies any pain today.  PERTINENT HISTORY:  Osteoporosis, history of breast cancer, history of DVT, and anxiety  PAIN:  Are you having pain? Yes: NPRS scale: 0/10 Pain location: low back Pain description: intermittent aching and tired Aggravating factors: gets worse as the day goes on Relieving factors: I just work through it   PRECAUTIONS: Fall  RED FLAGS: None   WEIGHT BEARING RESTRICTIONS: No  FALLS:  Has patient fallen in last 6 months? Yes. Number of falls 1; approximately 1 week ago when she was walking in her back door  LIVING ENVIRONMENT: Lives with: lives with their spouse Lives in: House/apartment Stairs: Yes: Internal: 15 steps; on right going up and External: 2 steps; none; reciprocal pattern Has following equipment at home: None  OCCUPATION: sells insurance  PLOF: Independent  PATIENT GOALS: improved strength to reduce her risk of re injury  NEXT MD VISIT: 04/24/23  OBJECTIVE:  Note: Objective measures were completed at Evaluation unless otherwise noted.  DIAGNOSTIC FINDINGS: 01/21/23 lumbar MRI IMPRESSION: Mild multilevel degenerative change without significant stenosis.  COGNITION: Overall cognitive status: Within functional limits for tasks assessed     SENSATION: Patient reports no numbness or tingling since her initial injury  POSTURE: forward head and decreased lumbar lordosis  PALPATION: TTP: right lumbar paraspinals and L3-5 spinous processes   LUMBAR JOINT MOBILITY:  L1-3: WFL and nonpainful   L3-5: hypomobile and nonpainful   LUMBAR ROM:   AROM eval  Flexion 64; pulling   Extension 20;  it feels good   Right lateral flexion   Left lateral flexion   Right rotation 25% limited  Left rotation WFL    (Blank rows = not tested)  LOWER  EXTREMITY ROM: WFL for activities assessed  LOWER EXTREMITY MMT:    MMT Right eval Left eval  Hip flexion 3+/5;  slight hip pain  3+/5  Hip extension    Hip abduction    Hip adduction    Hip internal rotation  Hip external rotation    Knee flexion 4/5 4/5  Knee extension 4+/5 5/5  Ankle dorsiflexion 4/5 4/5  Ankle plantarflexion    Ankle inversion    Ankle eversion     (Blank rows = not tested)  GAIT: Assistive device utilized: None Level of assistance: Complete Independence Comments: no significant gait deviations observed  TODAY'S TREATMENT:                                                                                                                              DATE:                                     04/04/23 EXERCISE LOG  Exercise Repetitions and Resistance Comments  Recumbent bike  L5 x 15 minutes   Rockerboard 5 mins   Cybex knee extension  20# x 3 mins   Cybex knee flexion  40# x 3 mins   Wood chops  Blue XTS x 25 reps each    Chair taps  25 reps  Added to HEP  Static stance on [employer] up x 3.5 minutes  Without UE support   Resisted pull down with rotation Green t-band x 30 reps each  Added to HEP   Squats 14# x 10 reps    Blank cell = exercise not performed today                                    03/29/23 EXERCISE LOG  Exercise Repetitions and Resistance Comments  Resisted pull down  Blue XTS x 20 reps   BOSU squats  3 minutes Without UE support   BOSU circles  2.5 minutes  Intermittent UE support; CW and CCW  Multifidus rotation  Green t-band x 30 reps each    Cybex knee flexion  40# x 30 reps    Cybex knee extension 20# x 30 reps    Rocker board  4 minutes   Standing HS stretch  3 x 30 seconds each    Piriformis stretch  3 x 30 seconds each    Figure 4 stretch  3 x 30 seconds each     Blank cell = exercise not performed today                                     PATIENT EDUCATION:  Education details: POC, healing, prognosis, referred pain,  HEP, and goals for physical therapy Person educated: Patient Education method: Explanation Education comprehension: verbalized understanding  HOME EXERCISE PROGRAM:   ASSESSMENT:  CLINICAL IMPRESSION: Pt arrives for today's treatment session denying any pain.  Pt able to tolerate increased time on recumbent bike today with minimal fatigue and no discomfort.  Pt  also able to tolerate increased time on cybex machinery and BOSU ball today without issue.  Pt able to demonstrate proper body mechanics with squats and lifting objects, meeting a long term goal.  Pt also able to tolerate increased reps with various other previously performed exercises today.  Pt denied any pain at completion of today's treatment session.  OBJECTIVE IMPAIRMENTS: decreased strength, hypomobility, impaired tone, and pain.   ACTIVITY LIMITATIONS: standing  PARTICIPATION LIMITATIONS: community activity  PERSONAL FACTORS: Time since onset of injury/illness/exacerbation and 3+ comorbidities: Osteoporosis, history of breast cancer, history of DVT, and anxiety  are also affecting patient's functional outcome.   REHAB POTENTIAL: Good  CLINICAL DECISION MAKING: Stable/uncomplicated  EVALUATION COMPLEXITY: Low   GOALS: Goals reviewed with patient? Yes  LONG TERM GOALS: Target date: 04/11/23  Patient will be independent with HEP.  Baseline:  Goal status: MET  2.  Patient will be able to demonstrate improved bilateral hip flexor strength to at least 4-/5 for improved functional mobility.  Baseline:  Goal status: IN PROGRESS  3.  Patient will be able to complete her daily activities without being limited by her familiar low back symptoms.  Baseline:  Goal status: MET  4.  Patient will be able to demonstrate proper lifting mechanics to reduce her risk of re injury.  Baseline:  Goal status: MET  PLAN:  PT FREQUENCY: 1-2x/week  PT DURATION: 4 weeks  PLANNED INTERVENTIONS: 97164- PT Re-evaluation,  97110-Therapeutic exercises, 97530- Therapeutic activity, 97112- Neuromuscular re-education, 97535- Self Care, [id number]- Manual therapy, Patient/Family education, Balance training, Dry Needling, Joint mobilization, Spinal mobilization, Cryotherapy, and Moist heat.  PLAN FOR NEXT SESSION: review HEP, lumbar and lower extremity strengthening, and manual therapy as needed   Delon DELENA Gosling, PTA 04/04/2023, 9:02 AM

## 2023-04-11 ENCOUNTER — Ambulatory Visit: Payer: BC Managed Care – PPO

## 2023-04-11 DIAGNOSIS — M6281 Muscle weakness (generalized): Secondary | ICD-10-CM | POA: Diagnosis not present

## 2023-04-11 DIAGNOSIS — M5459 Other low back pain: Secondary | ICD-10-CM

## 2023-04-11 NOTE — Therapy (Addendum)
 OUTPATIENT PHYSICAL THERAPY THORACOLUMBAR TREATMENT   Patient Name: Stephanie Brewer MRN: [id number] DOB:08/16/1965, 58 y.o., female Today's Date: 04/11/2023  END OF SESSION:  PT End of Session - 04/11/23 0804     Visit Number 7    Number of Visits 8    Date for PT Re-Evaluation 05/03/23    PT Start Time 0800    PT Stop Time 0842    PT Time Calculation (min) 42 min    Activity Tolerance Patient tolerated treatment well    Behavior During Therapy [facility] for tasks assessed/performed                 Past Medical History:  Diagnosis Date   Anemia    Anxiety    Breast cancer (HCC)    Clotting disorder (HCC)    Displacement of breast implant 12/24/2017   Diverticulitis    Diverticulosis    DVT (deep venous thrombosis) (HCC)    left leg knee and ankle    GERD (gastroesophageal reflux disease)    History of kidney stones    Kidney stones    Pneumonia    Pre-diabetes    Past Surgical History:  Procedure Laterality Date   BREAST BIOPSY Left 08/15/2021   BREAST ENHANCEMENT SURGERY     Augmentation 2003   BREAST IMPLANT REMOVAL Bilateral 01/14/2020   Procedure: REMOVAL BREAST IMPLANTS;  Surgeon: Elisabeth Craig RAMAN, MD;  Location: [facility];  Service: Plastics;  Laterality: Bilateral;   BREAST LUMPECTOMY Right 01/2020   BREAST LUMPECTOMY WITH RADIOACTIVE SEED AND SENTINEL LYMPH NODE BIOPSY Right 01/14/2020   Procedure: Right breast seed localized lumpectomy with sentinel lymph node biopsy;  Surgeon: Ebbie Cough, MD;  Location: [facility];  Service: General;  Laterality: Right;   BUNIONECTOMY Bilateral 2005   Right foot in 2006   LIPOSUCTION WITH LIPOFILLING Bilateral 11/29/2020   Procedure: LIPOSUCTION WITH LIPOFILLING FROM ABDOMEN TO BILATERAL BREASTS;  Surgeon: Elisabeth Craig RAMAN, MD;  Location: [facility];  Service: Plastics;  Laterality: Bilateral;   MASTOPEXY Left 11/29/2020   Procedure: LEFT MASTOPEXY;  Surgeon: Elisabeth Craig RAMAN, MD;  Location: [facility];  Service: Plastics;  Laterality: Left;   PORTACATH PLACEMENT N/A 09/01/2019   Procedure: INSERTION PORT-A-CATH WITH ULTRASOUND GUIDANCE;  Surgeon: Ebbie Cough, MD;  Location: [facility];  Service: General;  Laterality: N/A;   PROCTOSCOPY N/A 08/17/2022   Procedure: RIGID PROCTOSCOPY;  Surgeon: Sheldon Standing, MD;  Location: [facility];  Service: General;  Laterality: N/A;   TRIGGER FINGER RELEASE Left 05/09/2020   Procedure: RELEASE TRIGGER FINGER/A-1 PULLEY LEFT LONG;  Surgeon: Murrell Drivers, MD;  Location: [facility];  Service: Orthopedics;  Laterality: Left;   UMBILICAL HERNIA REPAIR  2002   Patient Active Problem List   Diagnosis Date Noted   Chronic nausea 08/20/2022   Sigmoid diverticulitis 08/17/2022   Chronic anticoagulation 07/16/2022   History of breast cancer 07/16/2022   Hypokalemia 05/27/2022   Normocytic anemia 05/26/2022   History of recurrent deep vein thrombosis (DVT) 05/26/2022   Pre-diabetes 05/23/2022   Pure hypercholesterolemia 05/23/2022   Osteoporosis of femur without pathological fracture 11/15/2021   Overweight (BMI 25.0-29.9) 08/15/2021   Port-A-Cath in place 09/09/2019   Malignant neoplasm of upper-outer quadrant of right breast in female, estrogen receptor positive (HCC) 08/20/2019   Breast cancer metastasized to axillary lymph node, right (HCC) 08/18/2019   Trigger middle finger of left hand 02/10/2019   Radicular leg  pain 02/04/2018   H/O bilateral breast implants 12/24/2017   GERD (gastroesophageal reflux disease) 12/06/2015   Gluten intolerance 12/06/2015    PCP: Jolinda Norene HERO, DO  REFERRING PROVIDER: Hilma Hastings, PA-C   REFERRING DIAG: Lumbar spondylosis   Rationale for Evaluation and Treatment: Rehabilitation  THERAPY DIAG:  Muscle weakness (generalized)  Other low back pain  ONSET DATE: a couple months ago  SUBJECTIVE:                                                                                                                                                                                            SUBJECTIVE STATEMENT: Patient denies any pain today.  PERTINENT HISTORY:  Osteoporosis, history of breast cancer, history of DVT, and anxiety  PAIN:  Are you having pain? Yes: NPRS scale: 0/10 Pain location: low back Pain description: intermittent aching and tired Aggravating factors: gets worse as the day goes on Relieving factors: I just work through it   PRECAUTIONS: Fall  RED FLAGS: None   WEIGHT BEARING RESTRICTIONS: No  FALLS:  Has patient fallen in last 6 months? Yes. Number of falls 1; approximately 1 week ago when she was walking in her back door  LIVING ENVIRONMENT: Lives with: lives with their spouse Lives in: House/apartment Stairs: Yes: Internal: 15 steps; on right going up and External: 2 steps; none; reciprocal pattern Has following equipment at home: None  OCCUPATION: sells insurance  PLOF: Independent  PATIENT GOALS: improved strength to reduce her risk of re injury  NEXT MD VISIT: 04/24/23  OBJECTIVE:  Note: Objective measures were completed at Evaluation unless otherwise noted.  DIAGNOSTIC FINDINGS: 01/21/23 lumbar MRI IMPRESSION: Mild multilevel degenerative change without significant stenosis.  COGNITION: Overall cognitive status: Within functional limits for tasks assessed     SENSATION: Patient reports no numbness or tingling since her initial injury  POSTURE: forward head and decreased lumbar lordosis  PALPATION: TTP: right lumbar paraspinals and L3-5 spinous processes   LUMBAR JOINT MOBILITY:  L1-3: WFL and nonpainful   L3-5: hypomobile and nonpainful   LUMBAR ROM:   AROM eval  Flexion 64; pulling   Extension 20;  it feels good   Right lateral flexion   Left lateral flexion   Right rotation 25% limited  Left rotation WFL    (Blank rows = not tested)  LOWER  EXTREMITY ROM: WFL for activities assessed  LOWER EXTREMITY MMT:    MMT Right eval Left eval  Hip flexion 3+/5;  slight hip pain  3+/5  Hip extension    Hip abduction    Hip adduction    Hip internal rotation  Hip external rotation    Knee flexion 4/5 4/5  Knee extension 4+/5 5/5  Ankle dorsiflexion 4/5 4/5  Ankle plantarflexion    Ankle inversion    Ankle eversion     (Blank rows = not tested)  GAIT: Assistive device utilized: None Level of assistance: Complete Independence Comments: no significant gait deviations observed  TODAY'S TREATMENT:                                                                                                                              DATE:                                     04/11/23 EXERCISE LOG  Exercise Repetitions and Resistance Comments  Recumbent bike  L5 x 15 minutes   Rockerboard 5 mins   Cybex knee extension  20# x 3.5 mins   Cybex knee flexion  40# x 3.5 mins   Wood chops  Blue XTS x 25 reps each    Chair taps   Added to [employer] stance on BOSU  Without UE support   Resisted pull down with rotation  Added to HEP   Squats     Blank cell = exercise not performed today                                    03/29/23 EXERCISE LOG  Exercise Repetitions and Resistance Comments  Resisted pull down  Blue XTS x 20 reps   BOSU squats  3 minutes Without UE support   BOSU circles  2.5 minutes  Intermittent UE support; CW and CCW  Multifidus rotation  Green t-band x 30 reps each    Cybex knee flexion  40# x 30 reps    Cybex knee extension 20# x 30 reps    Rocker board  4 minutes   Standing HS stretch  3 x 30 seconds each    Piriformis stretch  3 x 30 seconds each    Figure 4 stretch  3 x 30 seconds each     Blank cell = exercise not performed today                                     PATIENT EDUCATION:  Education details: POC, healing, prognosis, referred pain, HEP, and goals for physical therapy Person educated:  Patient Education method: Explanation Education comprehension: verbalized understanding  HOME EXERCISE PROGRAM:   ASSESSMENT:  CLINICAL IMPRESSION: Pt arrives for today's treatment session denying any pain.  Pt able to demonstrate 4/5 bil hip flexor strength today, meeting her LTG.  Pt has met all of the goals set forth for her at this time.  Education provided on the importance  of continued exercise and activity.  Pt encouraged to call the facility with any questions or concerns.  Pt denied any pain at completion of today's treatment session.  Pt ready for discharge at this time.  PHYSICAL THERAPY DISCHARGE SUMMARY  Visits from Start of Care: 7  Current functional level related to goals / functional outcomes: Patient was able to meet all of her goals for skilled physical therapy.    Remaining deficits: None    Education / Equipment: HEP    Patient agrees to discharge. Patient goals were met. Patient is being discharged due to meeting the stated rehab goals.  Lacinda Fass, PT, DPT    OBJECTIVE IMPAIRMENTS: decreased strength, hypomobility, impaired tone, and pain.   ACTIVITY LIMITATIONS: standing  PARTICIPATION LIMITATIONS: community activity  PERSONAL FACTORS: Time since onset of injury/illness/exacerbation and 3+ comorbidities: Osteoporosis, history of breast cancer, history of DVT, and anxiety  are also affecting patient's functional outcome.   REHAB POTENTIAL: Good  CLINICAL DECISION MAKING: Stable/uncomplicated  EVALUATION COMPLEXITY: Low   GOALS: Goals reviewed with patient? Yes  LONG TERM GOALS: Target date: 04/11/23  Patient will be independent with HEP.  Baseline:  Goal status: MET  2.  Patient will be able to demonstrate improved bilateral hip flexor strength to at least 4-/5 for improved functional mobility.  Baseline: 1/9: 4/5 bil hip flexor Goal status: MET  3.  Patient will be able to complete her daily activities without being limited by her  familiar low back symptoms.  Baseline:  Goal status: MET  4.  Patient will be able to demonstrate proper lifting mechanics to reduce her risk of re injury.  Baseline:  Goal status: MET  PLAN:  PT FREQUENCY: 1-2x/week  PT DURATION: 4 weeks  PLANNED INTERVENTIONS: 97164- PT Re-evaluation, 97110-Therapeutic exercises, 97530- Therapeutic activity, 97112- Neuromuscular re-education, 97535- Self Care, [id number]- Manual therapy, Patient/Family education, Balance training, Dry Needling, Joint mobilization, Spinal mobilization, Cryotherapy, and Moist heat.  PLAN FOR NEXT SESSION: review HEP, lumbar and lower extremity strengthening, and manual therapy as needed   Delon DELENA Gosling, PTA 04/11/2023, 9:27 AM

## 2023-04-17 ENCOUNTER — Other Ambulatory Visit: Payer: Self-pay | Admitting: Family Medicine

## 2023-04-19 NOTE — Progress Notes (Deleted)
Referring Physician:  Raliegh Ip, DO [address] [place],  [place] [address]  Primary Physician:  Raliegh Ip, DO  History of Present Illness: [name] has a history of osteoporosis, GERD, breast CA, history of DVT.   History of chronic LBP x years with intermittent flare ups.   Last seen by me on 03/05/23 for chronic LBP x years with intermittent flare ups. Had no pain at last visit- her constant LBP and right groin pain had resolved.   She has known diffuse lumbar spondylosis and facet hypertrophy. No foraminal or central stenosis appreciated on MRI.   She was sent to PT and is here for follow up.   She did 7 visits of PT and was discharged on 04/11/23.        for constant LBP with right groin pain. She has known lumbar spondylosis with minimal curve. Symptoms appear to be lumbar mediated. No groin pain with IR/ER of her right hip.   MRI was ordered and she is here to review it.   She had LBP and right groin pain for about 3-4 weeks and then it improved. She currently has no pain. No numbness, tingling, or weakness noted. She feels great.   She is on XARELTO.   Bowel/Bladder Dysfunction: none  Conservative measures: seen a chiropractor  Physical therapy: initial eval on 03/14/23, did 6 visits, and was discharged on 04/11/23.  Multimodal medical therapy including regular antiinflammatories: toradol injection, prednisone, oxycodone  Injections: has not received epidural steroid injections  Past Surgery: no previous spinal surgeries  [name] has no symptoms of cervical myelopathy.  The symptoms are causing a significant impact on the patient's life.   Review of Systems:  A 10 point review of systems is negative, except for the pertinent positives and negatives detailed in the HPI.  Past Medical History: Past Medical History:  Diagnosis Date   Anemia    Anxiety    Breast cancer (HCC)    Clotting disorder (HCC)    Displacement of breast  implant 12/24/2017   Diverticulitis    Diverticulosis    DVT (deep venous thrombosis) (HCC)    left leg knee and ankle    GERD (gastroesophageal reflux disease)    History of kidney stones    Kidney stones    Pneumonia    Pre-diabetes     Past Surgical History: Past Surgical History:  Procedure Laterality Date   BREAST BIOPSY Left 08/15/2021   BREAST ENHANCEMENT SURGERY     Augmentation 2003   BREAST IMPLANT REMOVAL Bilateral 01/14/2020   Procedure: REMOVAL BREAST IMPLANTS;  Surgeon: Allena Napoleon, MD;  Location: [facility];  Service: Plastics;  Laterality: Bilateral;   BREAST LUMPECTOMY Right 01/2020   BREAST LUMPECTOMY WITH RADIOACTIVE SEED AND SENTINEL LYMPH NODE BIOPSY Right 01/14/2020   Procedure: Right breast seed localized lumpectomy with sentinel lymph node biopsy;  Surgeon: Emelia Loron, MD;  Location: [facility];  Service: General;  Laterality: Right;   BUNIONECTOMY Bilateral 2005   Right foot in 2006   LIPOSUCTION WITH LIPOFILLING Bilateral 11/29/2020   Procedure: LIPOSUCTION WITH LIPOFILLING FROM ABDOMEN TO BILATERAL BREASTS;  Surgeon: Allena Napoleon, MD;  Location: [facility];  Service: Plastics;  Laterality: Bilateral;   MASTOPEXY Left 11/29/2020   Procedure: LEFT MASTOPEXY;  Surgeon: Allena Napoleon, MD;  Location: [facility];  Service: Plastics;  Laterality: Left;   PORTACATH PLACEMENT N/A 09/01/2019  Procedure: INSERTION PORT-A-CATH WITH ULTRASOUND GUIDANCE;  Surgeon: Emelia Loron, MD;  Location: [facility];  Service: General;  Laterality: N/A;   PROCTOSCOPY N/A 08/17/2022   Procedure: RIGID PROCTOSCOPY;  Surgeon: Karie Soda, MD;  Location: [facility];  Service: General;  Laterality: N/A;   TRIGGER FINGER RELEASE Left 05/09/2020   Procedure: RELEASE TRIGGER FINGER/A-1 PULLEY LEFT LONG;  Surgeon: Betha Loa, MD;  Location: [facility];  Service: Orthopedics;   Laterality: Left;   UMBILICAL HERNIA REPAIR  2002    Allergies: Allergies as of 04/24/2023 - Review Complete 04/11/2023  Allergen Reaction Noted   Hyoscyamine Anaphylaxis 01/15/2017   Fosamax [alendronate] Nausea Only and Other (See Comments) 05/23/2022   Nexium [esomeprazole] Swelling and Other (See Comments) 12/06/2015   Doxycycline Palpitations and Other (See Comments) 12/06/2015    Medications: Outpatient Encounter Medications as of 04/24/2023  Medication Sig   anastrozole (ARIMIDEX) 1 MG tablet Take 1 tablet (1 mg total) by mouth at bedtime.   glucose blood test strip UAD to check sugar. R73.03   Lancets Thin MISC UAD to check sugar. R73.03   metroNIDAZOLE (METROGEL) 1 % gel Apply topically daily. To face (Patient not taking: Reported on 03/14/2023)   minocycline (MINOCIN) 100 MG capsule Take 100 mg by mouth daily.   ondansetron (ZOFRAN) 4 MG tablet TAKE ONE TABLET BY MOUTH EVERY 6 HOURS   oxyCODONE-acetaminophen (PERCOCET/ROXICET) 5-325 MG tablet Take 1-2 tablets by mouth every 6 (six) hours as needed for severe pain (pain score 7-10). (Patient not taking: Reported on 03/14/2023)   TYLENOL 325 MG tablet Take 325 mg by mouth every 6 (six) hours as needed for headache or mild pain.   XARELTO 20 MG TABS tablet TAKE ONE TABLET ONCE DAILY WITH SUPPER   No facility-administered encounter medications on file as of 04/24/2023.    Social History: Social History   Tobacco Use   Smoking status: Never   Smokeless tobacco: Never  Vaping Use   Vaping status: Never Used  Substance Use Topics   Alcohol use: Yes    Comment: 1 PER MONTH   Drug use: No    Family Medical History: Family History  Problem Relation Age of Onset   Hyperlipidemia Mother    Polymyalgia rheumatica Mother    Hypertension Father    Hyperlipidemia Father    Diabetes type I Brother    Ovarian cancer Maternal Aunt    Diabetes type II Maternal Grandfather    Allergies Daughter    Colon cancer Neg Hx     Esophageal cancer Neg Hx    Rectal cancer Neg Hx    Stomach cancer Neg Hx     Physical Examination: There were no vitals filed for this visit.    Awake, alert, oriented to person, place, and time.  Speech is clear and fluent. Fund of knowledge is appropriate.   Cranial Nerves: Pupils equal round and reactive to light.  Facial tone is symmetric.    Very mild lower lumbar tenderness.   No abnormal lesions on exposed skin.   Strength:  Side Iliopsoas Quads Hamstring PF DF EHL  R 5 5 5 5 5 5   L 5 5 5 5 5 5    Reflexes are 2+ and symmetric at the patella and achilles.    Clonus is not present.   Bilateral lower extremity sensation is intact to light touch.   Gait is normal.     Medical Decision Making  Imaging: none  Assessment and Plan: Ms.  Labarge is a pleasant 58 y.o. female who has a history of chronic LBP x years with intermittent flare ups.   She had 3-4 weeks of constant LBP with right groin pain that has resolved. She has no current pain. No numbness, tingling, or weakness.   She has known diffuse lumbar spondylosis and facet hypertrophy. No foraminal or central stenosis appreciated on above MRI.   Treatment options discussed with patient and following plan made:   - PT ordered at [facility] in [place].  - Recommend she stop seeing her chiropractor. Given letter.  - Right renal cyst seen on lumbar MRI- was present on CT from 12/26/21. Message sent to PCP and oncology regarding this. Patient is aware.  - If pain returns, consider referral for possible facet injections.  - Follow up with me in 6-8 weeks for recheck.   I spent a total of 20 minutes in face-to-face and non-face-to-face activities related to this patient's care today including review of outside records, review of imaging, review of symptoms, physical exam, discussion of differential diagnosis, discussion of treatment options, and documentation.   Drake Leach PA-C Dept. of Neurosurgery

## 2023-04-24 ENCOUNTER — Ambulatory Visit: Payer: BC Managed Care – PPO | Admitting: Nurse Practitioner

## 2023-04-24 ENCOUNTER — Ambulatory Visit: Payer: BC Managed Care – PPO | Admitting: Orthopedic Surgery

## 2023-04-24 ENCOUNTER — Encounter: Payer: Self-pay | Admitting: Nurse Practitioner

## 2023-04-24 VITALS — BP 124/80 | HR 63 | Temp 97.3°F | Ht 64.0 in | Wt 128.6 lb

## 2023-04-24 DIAGNOSIS — H9202 Otalgia, left ear: Secondary | ICD-10-CM

## 2023-04-24 DIAGNOSIS — R42 Dizziness and giddiness: Secondary | ICD-10-CM

## 2023-04-24 LAB — GLUCOSE HEMOCUE WAIVED: Glu Hemocue Waived: 78 mg/dL (ref 70–99)

## 2023-04-24 MED ORDER — OFLOXACIN 0.3 % OT SOLN: 5.0000 [drp] | Freq: Every day | OTIC | 0 refills | Status: DC

## 2023-04-24 MED ORDER — MECLIZINE HCL 12.5 MG PO TABS: 12.5000 mg | ORAL_TABLET | Freq: Three times a day (TID) | ORAL | 0 refills | Status: AC | PRN

## 2023-04-24 NOTE — Progress Notes (Signed)
Acute Office Visit  Subjective:     Patient ID: Stephanie Brewer, female    DOB: 01-29-1966, 58 y.o.   MRN: [id number]  Chief Complaint  Patient presents with   Dizziness    Monday morning started getting very dizzy, has been dizzy since and it is getting worse. Has had vertigo in the past    Dizziness Pertinent negatives include no chest pain, chills, coughing, fever, nausea, rash or vomiting.   Stephanie Brewer is a 58 yrs old female present 04/24/2023 for an acute visit concerns for episodes of dizziness and left ear pain. Dizziness  She reports new onset dizziness. She describes it as feeling light headed, feeling like room is spinning, feeling like patient is spinning, and feeling unbalanced, occurs intermittently, and typically lasts " last 10 mins, had at lest 8 episodes yesterday.  It typically occurs when she is standing still. It is usually relieved by lying down and holding head still. She has not started new medications around the time the dizziness started. Hs history of Vertigo, last one was 10 yrs ago  Associated symptoms: No hearing loss No tinnitus  No chest discomfort No heart palpitations  No heart racing No numbness or tingling of extremities  No nausea No vomiting  No speech difficulty No visual changes    Wt Readings from Last 3 Encounters:  04/24/23 128 lb 9.6 oz (58.3 kg)  03/05/23 124 lb (56.2 kg)  02/25/23 124 lb 4 oz (56.4 kg)    BP Readings from Last 3 Encounters:  04/24/23 124/80  03/05/23 118/78  02/25/23 105/68      Lab Results  Component Value Date   WBC 3.7 (L) 02/25/2023   HGB 13.1 02/25/2023   HCT 39.5 02/25/2023   MCV 89.8 02/25/2023   PLT 146 (L) 02/25/2023   Lab Results  Component Value Date   NA 139 02/25/2023   K 3.2 (L) 02/25/2023   CO2 28 02/25/2023   BUN 10 02/25/2023   CREATININE 0.65 02/25/2023   CALCIUM 8.6 (L) 02/25/2023   GLUCOSE 98 02/25/2023     Ear Pain: Patient presents with left ear pain.  Symptoms include left  ear drainage  and left ear pain. Symptoms began 1 week ago and are unchanged since that time. Patient denies chills, fever, productive cough, sneezing, and sore throat. Ear history: 1 previous ear infections.    Active Ambulatory Problems    Diagnosis Date Noted   GERD (gastroesophageal reflux disease) 12/06/2015   Gluten intolerance 12/06/2015   H/O bilateral breast implants 12/24/2017   Radicular leg pain 02/04/2018   Malignant neoplasm of upper-outer quadrant of right breast in female, estrogen receptor positive (HCC) 08/20/2019   Port-A-Cath in place 09/09/2019   Overweight (BMI 25.0-29.9) 08/15/2021   Trigger middle finger of left hand 02/10/2019   Breast cancer metastasized to axillary lymph node, right (HCC) 08/18/2019   Osteoporosis of femur without pathological fracture 11/15/2021   Pre-diabetes 05/23/2022   Pure hypercholesterolemia 05/23/2022   Normocytic anemia 05/26/2022   History of recurrent deep vein thrombosis (DVT) 05/26/2022   Hypokalemia 05/27/2022   Chronic anticoagulation 07/16/2022   History of breast cancer 07/16/2022   Sigmoid diverticulitis 08/17/2022   Chronic nausea 08/20/2022   Resolved Ambulatory Problems    Diagnosis Date Noted   Displacement of breast implant 12/24/2017   Numbness and tingling 02/04/2018   Foot pain, left 02/04/2018   Diverticulitis 05/23/2022   Past Medical History:  Diagnosis Date   Anemia  Anxiety    Breast cancer (HCC)    Clotting disorder (HCC)    Diverticulosis    DVT (deep venous thrombosis) (HCC)    History of kidney stones    Kidney stones    Pneumonia     Review of Systems  Constitutional:  Negative for chills and fever.  HENT:  Positive for ear pain.   Respiratory:  Negative for cough and shortness of breath.   Cardiovascular:  Negative for chest pain, palpitations and leg swelling.  Gastrointestinal:  Negative for constipation, diarrhea, nausea and vomiting.  Skin:  Negative for itching and rash.   Neurological:  Positive for dizziness.  Psychiatric/Behavioral:  Negative for suicidal ideas.    Negative unless indicated in HPI    Objective:    BP 124/80   Pulse 63   Temp (!) 97.3 F (36.3 C) (Temporal)   Ht 5\' 4"  (1.626 m)   Wt 128 lb 9.6 oz (58.3 kg)   SpO2 100%   BMI 22.07 kg/m  BP Readings from Last 3 Encounters:  04/24/23 124/80  03/05/23 118/78  02/25/23 105/68   Wt Readings from Last 3 Encounters:  04/24/23 128 lb 9.6 oz (58.3 kg)  03/05/23 124 lb (56.2 kg)  02/25/23 124 lb 4 oz (56.4 kg)      Physical Exam Vitals and nursing note reviewed.  Constitutional:      General: She is not in acute distress.    Appearance: Normal appearance.  HENT:     Head: Normocephalic and atraumatic.     Right Ear: Hearing, ear canal and external ear normal.     Left Ear: Drainage and tenderness present. Tympanic membrane is bulging.     Nose: Nose normal.     Mouth/Throat:     Mouth: Mucous membranes are moist.  Eyes:     Extraocular Movements: Extraocular movements intact.     Conjunctiva/sclera: Conjunctivae normal.     Pupils: Pupils are equal, round, and reactive to light.  Neck:     Vascular: No carotid bruit.  Cardiovascular:     Rate and Rhythm: Normal rate and regular rhythm.  Pulmonary:     Effort: Pulmonary effort is normal.     Breath sounds: Normal breath sounds.  Musculoskeletal:        General: Normal range of motion.     Cervical back: Normal range of motion and neck supple. No rigidity or tenderness.     Right lower leg: No edema.     Left lower leg: No edema.  Lymphadenopathy:     Cervical: No cervical adenopathy.  Skin:    General: Skin is warm and dry.     Findings: No rash.  Neurological:     Mental Status: She is alert and oriented to person, place, and time. Mental status is at baseline.     No results found for any visits on 04/24/23.      Assessment & Plan:  Dizziness -     Glucose Hemocue Waived -     Meclizine HCl; Take 1  tablet (12.5 mg total) by mouth 3 (three) times daily as needed for dizziness.  Dispense: 30 tablet; Refill: 0  Vertigo -     Meclizine HCl; Take 1 tablet (12.5 mg total) by mouth 3 (three) times daily as needed for dizziness.  Dispense: 30 tablet; Refill: 0  Left ear pain -     Ofloxacin; Place 5 drops into the left ear daily.  Dispense: 5 mL; Refill: 0  Stephanie Brewer is a 10 year old Caucasian female seen today for dizziness and left ear pain, no acute distress Orthostatic BP negative, client will be treated for vertigo with meclizine 12.5 mg as needed 3 times daily Left ear pain: Ofloxacin 5 drops to left ear daily All questions answered    The above assessment and management plan was discussed with the patient. The patient verbalized understanding of and has agreed to the management plan. Patient is aware to call the clinic if they develop any new symptoms or if symptoms persist or worsen. Patient is aware when to return to the clinic for a follow-up visit. Patient educated on when it is appropriate to go to the emergency department.  Return if symptoms worsen or fail to improve.    Note: This document was prepared by [employer] voice dictation technology and any errors that results from this process are unintentional.

## 2023-06-07 ENCOUNTER — Other Ambulatory Visit: Payer: Self-pay | Admitting: Hematology and Oncology

## 2023-06-07 DIAGNOSIS — Z1231 Encounter for screening mammogram for malignant neoplasm of breast: Secondary | ICD-10-CM

## 2023-06-10 ENCOUNTER — Ambulatory Visit: Payer: BC Managed Care – PPO | Attending: Hematology and Oncology

## 2023-06-10 VITALS — Wt 129.4 lb

## 2023-06-10 DIAGNOSIS — Z483 Aftercare following surgery for neoplasm: Secondary | ICD-10-CM | POA: Insufficient documentation

## 2023-06-10 NOTE — Therapy (Signed)
 OUTPATIENT PHYSICAL THERAPY SOZO SCREENING NOTE   Patient Name: Stephanie Brewer MRN: [id number] DOB:1965/06/04, 58 y.o., female Today's Date: 06/10/2023  PCP: Raliegh Ip, DO REFERRING PROVIDER: Serena Croissant, MD   PT End of Session - 06/10/23 0831     Visit Number 7   # unchanged due to screen only            Past Medical History:  Diagnosis Date   Anemia    Anxiety    Breast cancer (HCC)    Clotting disorder (HCC)    Displacement of breast implant 12/24/2017   Diverticulitis    Diverticulosis    DVT (deep venous thrombosis) (HCC)    left leg knee and ankle    GERD (gastroesophageal reflux disease)    History of kidney stones    Kidney stones    Pneumonia    Pre-diabetes    Past Surgical History:  Procedure Laterality Date   BREAST BIOPSY Left 08/15/2021   BREAST ENHANCEMENT SURGERY     Augmentation 2003   BREAST IMPLANT REMOVAL Bilateral 01/14/2020   Procedure: REMOVAL BREAST IMPLANTS;  Surgeon: Allena Napoleon, MD;  Location: [facility];  Service: Plastics;  Laterality: Bilateral;   BREAST LUMPECTOMY Right 01/2020   BREAST LUMPECTOMY WITH RADIOACTIVE SEED AND SENTINEL LYMPH NODE BIOPSY Right 01/14/2020   Procedure: Right breast seed localized lumpectomy with sentinel lymph node biopsy;  Surgeon: Emelia Loron, MD;  Location: [facility];  Service: General;  Laterality: Right;   BUNIONECTOMY Bilateral 2005   Right foot in 2006   LIPOSUCTION WITH LIPOFILLING Bilateral 11/29/2020   Procedure: LIPOSUCTION WITH LIPOFILLING FROM ABDOMEN TO BILATERAL BREASTS;  Surgeon: Allena Napoleon, MD;  Location: [facility];  Service: Plastics;  Laterality: Bilateral;   MASTOPEXY Left 11/29/2020   Procedure: LEFT MASTOPEXY;  Surgeon: Allena Napoleon, MD;  Location: [facility];  Service: Plastics;  Laterality: Left;   PORTACATH PLACEMENT N/A 09/01/2019   Procedure: INSERTION PORT-A-CATH WITH ULTRASOUND  GUIDANCE;  Surgeon: Emelia Loron, MD;  Location: [facility];  Service: General;  Laterality: N/A;   PROCTOSCOPY N/A 08/17/2022   Procedure: RIGID PROCTOSCOPY;  Surgeon: Karie Soda, MD;  Location: [facility];  Service: General;  Laterality: N/A;   TRIGGER FINGER RELEASE Left 05/09/2020   Procedure: RELEASE TRIGGER FINGER/A-1 PULLEY LEFT LONG;  Surgeon: Betha Loa, MD;  Location: [facility];  Service: Orthopedics;  Laterality: Left;   UMBILICAL HERNIA REPAIR  2002   Patient Active Problem List   Diagnosis Date Noted   Chronic nausea 08/20/2022   Sigmoid diverticulitis 08/17/2022   Chronic anticoagulation 07/16/2022   History of breast cancer 07/16/2022   Hypokalemia 05/27/2022   Normocytic anemia 05/26/2022   History of recurrent deep vein thrombosis (DVT) 05/26/2022   Pre-diabetes 05/23/2022   Pure hypercholesterolemia 05/23/2022   Osteoporosis of femur without pathological fracture 11/15/2021   Overweight (BMI 25.0-29.9) 08/15/2021   Port-A-Cath in place 09/09/2019   Malignant neoplasm of upper-outer quadrant of right breast in female, estrogen receptor positive (HCC) 08/20/2019   Breast cancer metastasized to axillary lymph node, right (HCC) 08/18/2019   Trigger middle finger of left hand 02/10/2019   Radicular leg pain 02/04/2018   H/O bilateral breast implants 12/24/2017   GERD (gastroesophageal reflux disease) 12/06/2015   Gluten intolerance 12/06/2015    REFERRING DIAG: right breast cancer at risk for lymphedema  THERAPY DIAG: Aftercare following surgery for neoplasm  PERTINENT HISTORY: R  breast cancer, grade 2 invasive ductal carcinoma, ER +, PR-, HER2+, beginning chemo next week 09/02/19, 01/15/20- R lumpectomy and SLNB (4 nodes all negative), will begin radiation 02/15/20,  hx of LLE DVT following foot surgery for bunion, acute DVT in LUE and in LLE diagnosed on 12/16/20   PRECAUTIONS: right UE Lymphedema risk, None  SUBJECTIVE: Pt  returns for her 6 month L-Dex screen.   PAIN:  Are you having pain? No  SOZO SCREENING: Patient was assessed today using the SOZO machine to determine the lymphedema index score. This was compared to her baseline score. It was determined that she is within the recommended range when compared to her baseline and no further action is needed at this time. She will continue SOZO screenings. These are done every 3 months for 2 years post operatively followed by every 6 months for 2 years, and then annually.   L-DEX FLOWSHEETS - 06/10/23 0800       L-DEX LYMPHEDEMA SCREENING   Measurement Type Unilateral    L-DEX MEASUREMENT EXTREMITY Upper Extremity    POSITION  Standing    DOMINANT SIDE Left    At Risk Side Right    BASELINE SCORE (UNILATERAL) 1.4    L-DEX SCORE (UNILATERAL) 1.8    VALUE CHANGE (UNILAT) 0.4              Hermenia Bers, PTA 06/10/2023, 8:32 AM

## 2023-06-19 ENCOUNTER — Other Ambulatory Visit: Payer: Self-pay | Admitting: Orthopedic Surgery

## 2023-06-19 DIAGNOSIS — M65311 Trigger thumb, right thumb: Secondary | ICD-10-CM | POA: Diagnosis not present

## 2023-06-19 DIAGNOSIS — M65331 Trigger finger, right middle finger: Secondary | ICD-10-CM | POA: Diagnosis not present

## 2023-06-25 ENCOUNTER — Encounter: Payer: Self-pay | Admitting: Family Medicine

## 2023-06-25 ENCOUNTER — Ambulatory Visit: Payer: BC Managed Care – PPO | Admitting: Family Medicine

## 2023-06-25 ENCOUNTER — Telehealth: Payer: Self-pay | Admitting: Family Medicine

## 2023-06-25 ENCOUNTER — Encounter: Payer: Self-pay | Admitting: *Deleted

## 2023-06-25 VITALS — BP 104/73 | HR 84 | Temp 98.4°F | Ht 64.0 in | Wt 130.8 lb

## 2023-06-25 DIAGNOSIS — R198 Other specified symptoms and signs involving the digestive system and abdomen: Secondary | ICD-10-CM

## 2023-06-25 DIAGNOSIS — C50911 Malignant neoplasm of unspecified site of right female breast: Secondary | ICD-10-CM

## 2023-06-25 DIAGNOSIS — Z7901 Long term (current) use of anticoagulants: Secondary | ICD-10-CM

## 2023-06-25 DIAGNOSIS — R7303 Prediabetes: Secondary | ICD-10-CM | POA: Diagnosis not present

## 2023-06-25 DIAGNOSIS — Z0000 Encounter for general adult medical examination without abnormal findings: Secondary | ICD-10-CM

## 2023-06-25 DIAGNOSIS — M810 Age-related osteoporosis without current pathological fracture: Secondary | ICD-10-CM

## 2023-06-25 DIAGNOSIS — E7800 Pure hypercholesterolemia, unspecified: Secondary | ICD-10-CM | POA: Diagnosis not present

## 2023-06-25 DIAGNOSIS — D649 Anemia, unspecified: Secondary | ICD-10-CM | POA: Diagnosis not present

## 2023-06-25 DIAGNOSIS — R519 Headache, unspecified: Secondary | ICD-10-CM | POA: Diagnosis not present

## 2023-06-25 DIAGNOSIS — C773 Secondary and unspecified malignant neoplasm of axilla and upper limb lymph nodes: Secondary | ICD-10-CM | POA: Diagnosis not present

## 2023-06-25 DIAGNOSIS — Z0001 Encounter for general adult medical examination with abnormal findings: Secondary | ICD-10-CM

## 2023-06-25 DIAGNOSIS — M65311 Trigger thumb, right thumb: Secondary | ICD-10-CM

## 2023-06-25 DIAGNOSIS — Z0279 Encounter for issue of other medical certificate: Secondary | ICD-10-CM

## 2023-06-25 DIAGNOSIS — Z86718 Personal history of other venous thrombosis and embolism: Secondary | ICD-10-CM

## 2023-06-25 LAB — BAYER DCA HB A1C WAIVED: HB A1C (BAYER DCA - WAIVED): 5.5 % (ref 4.8–5.6)

## 2023-06-25 LAB — LIPID PANEL

## 2023-06-25 NOTE — Progress Notes (Signed)
 Stephanie Brewer is a 58 y.o. female presents to office today for annual physical exam examination.    Concerns today include: 1.  Headaches She has had a headache either on the right frontal aspect of the head over the posterior head 8 out of 10 days.  She has had similar before and her oncologist got an MRI of her brain which was unrevealing.  She notes that she does suffer from some allergies but they are not severe.  She reports no neurologic changes including double vision, balance issues but did have a fall back in December where she injured her knee and ankle.  Sometimes those are still tender.  It does not impair her ability to ambulate however.  Headaches are relieved by Tylenol  2.  Constipation with diarrhea She reports that she has alternating constipation diarrhea.  No blood in stool.  She has been gaining some weight despite no changes in diet.  She worries about regaining her weight.  Was previously on GLP to help reduce weight and then had colonic surgery last year.  She reports no nausea, vomiting or abdominal pain  3.  Trigger finger She has 2 trigger fingers of the right hand that she will be having surgery on May 2 for.  She notes the pain Will be severe at times and have not been relieved by corticosteroid injections  Occupation: Works for Engineer, site, Marital status: Married, Substance use: None There are no preventive care reminders to display for this patient.  Refills needed today: None  Immunization History  Administered Date(s) Administered   Td 07/14/1997, 07/28/2008   Past Medical History:  Diagnosis Date   Anemia    Anxiety    Breast cancer (HCC)    Clotting disorder (HCC)    Displacement of breast implant 12/24/2017   Diverticulitis    Diverticulosis    DVT (deep venous thrombosis) (HCC)    left leg knee and ankle    GERD (gastroesophageal reflux disease)    History of kidney stones    Kidney stones    Pneumonia    Pre-diabetes    Sigmoid  diverticulitis 08/17/2022   Social History   Socioeconomic History   Marital status: Married    Spouse name: Tim   Number of children: 2   Years of education: some college   Highest education level: Some college, no degree  Occupational History   Occupation: [employer] banker, corporate  Tobacco Use   Smoking status: Never   Smokeless tobacco: Never  Vaping Use   Vaping status: Never Used  Substance and Sexual Activity   Alcohol use: Yes    Comment: 1 PER MONTH   Drug use: No   Sexual activity: Yes    Birth control/protection: I.U.D.  Other Topics Concern   Not on file  Social History Narrative   Lives with spouse   Caffeine use: no caffeine    Left handed    Works for an Chartered certified accountant to read fiction   Social Drivers of Corporate investment banker Strain: Low Risk  (06/24/2023)   Overall Financial Resource Strain (CARDIA)    Difficulty of Paying Living Expenses: Not hard at all  Food Insecurity: No Food Insecurity (06/24/2023)   Hunger Vital Sign    Worried About Running Out of Food in the Last Year: Never true    Ran Out of Food in the Last Year: Never true  Transportation Needs: No Transportation Needs (06/24/2023)   PRAPARE -  Administrator, Civil Service (Medical): No    Lack of Transportation (Non-Medical): No  Physical Activity: Insufficiently Active (06/24/2023)   Exercise Vital Sign    Days of Exercise per Week: 2 days    Minutes of Exercise per Session: 30 min  Stress: No Stress Concern Present (06/24/2023)   [employer] of Occupational Health - Occupational Stress Questionnaire    Feeling of Stress : Only a little  Social Connections: Socially Integrated (06/25/2023)   Social Connection and Isolation Panel [NHANES]    Frequency of Communication with Friends and Family: More than three times a week    Frequency of Social Gatherings with Friends and Family: Twice a week    Attends Religious Services: More than 4 times per year    Active Member  of [employer] or Organizations: Yes    Attends Banker Meetings: More than 4 times per year    Marital Status: Married  Catering manager Violence: Not At Risk (06/25/2023)   Humiliation, Afraid, Rape, and Kick questionnaire    Fear of Current or Ex-Partner: No    Emotionally Abused: No    Physically Abused: No    Sexually Abused: No   Past Surgical History:  Procedure Laterality Date   BREAST BIOPSY Left 08/15/2021   BREAST ENHANCEMENT SURGERY     Augmentation 2003   BREAST IMPLANT REMOVAL Bilateral 01/14/2020   Procedure: REMOVAL BREAST IMPLANTS;  Surgeon: Allena Napoleon, MD;  Location: [facility];  Service: Plastics;  Laterality: Bilateral;   BREAST LUMPECTOMY Right 01/2020   BREAST LUMPECTOMY WITH RADIOACTIVE SEED AND SENTINEL LYMPH NODE BIOPSY Right 01/14/2020   Procedure: Right breast seed localized lumpectomy with sentinel lymph node biopsy;  Surgeon: Emelia Loron, MD;  Location: [facility];  Service: General;  Laterality: Right;   BUNIONECTOMY Bilateral 2005   Right foot in 2006   LIPOSUCTION WITH LIPOFILLING Bilateral 11/29/2020   Procedure: LIPOSUCTION WITH LIPOFILLING FROM ABDOMEN TO BILATERAL BREASTS;  Surgeon: Allena Napoleon, MD;  Location: [facility];  Service: Plastics;  Laterality: Bilateral;   MASTOPEXY Left 11/29/2020   Procedure: LEFT MASTOPEXY;  Surgeon: Allena Napoleon, MD;  Location: [facility];  Service: Plastics;  Laterality: Left;   PORTACATH PLACEMENT N/A 09/01/2019   Procedure: INSERTION PORT-A-CATH WITH ULTRASOUND GUIDANCE;  Surgeon: Emelia Loron, MD;  Location: [facility];  Service: General;  Laterality: N/A;   PROCTOSCOPY N/A 08/17/2022   Procedure: RIGID PROCTOSCOPY;  Surgeon: Karie Soda, MD;  Location: [facility];  Service: General;  Laterality: N/A;   TRIGGER FINGER RELEASE Left 05/09/2020   Procedure: RELEASE TRIGGER FINGER/A-1 PULLEY LEFT LONG;  Surgeon:  Betha Loa, MD;  Location: [facility];  Service: Orthopedics;  Laterality: Left;   UMBILICAL HERNIA REPAIR  2002   Family History  Problem Relation Age of Onset   Hyperlipidemia Mother    Polymyalgia rheumatica Mother    Hypertension Father    Hyperlipidemia Father    Diabetes type I Brother    Ovarian cancer Maternal Aunt    Diabetes type II Maternal Grandfather    Allergies Daughter    Colon cancer Neg Hx    Esophageal cancer Neg Hx    Rectal cancer Neg Hx    Stomach cancer Neg Hx     Current Outpatient Medications:    anastrozole (ARIMIDEX) 1 MG tablet, Take 1 tablet (1 mg total) by mouth at bedtime., Disp: 90 tablet, Rfl: 3  Calcium Carb-Cholecalciferol (CALCIUM 500 +D PO), Take by mouth., Disp: , Rfl:    glucose blood test strip, UAD to check sugar. R73.03, Disp: 100 each, Rfl: 12   Lancets Thin MISC, UAD to check sugar. R73.03, Disp: 100 each, Rfl: 12   meclizine (ANTIVERT) 12.5 MG tablet, Take 1 tablet (12.5 mg total) by mouth 3 (three) times daily as needed for dizziness., Disp: 30 tablet, Rfl: 0   minocycline (MINOCIN) 100 MG capsule, Take 100 mg by mouth daily., Disp: , Rfl:    XARELTO 20 MG TABS tablet, TAKE ONE TABLET ONCE DAILY WITH SUPPER, Disp: 30 tablet, Rfl: 12   zoledronic acid (ZOMETA) 4 MG/5ML injection, Inject 4 mg into the vein once. W/ hematology/oncology, Disp: , Rfl:   Allergies  Allergen Reactions   Hyoscyamine Anaphylaxis   Fosamax [Alendronate] Nausea Only and Other (See Comments)    SEVERE NAUSEA   Nexium [Esomeprazole] Swelling and Other (See Comments)    Tongue became swollen- breathing not affected   Doxycycline Palpitations and Other (See Comments)    Racing heart     ROS: Review of Systems Pertinent items noted in HPI and remainder of comprehensive ROS otherwise negative.    Physical exam BP 104/73   Pulse 84   Temp 98.4 F (36.9 C)   Ht 5\' 4"  (1.626 m)   Wt 130 lb 12.8 oz (59.3 kg)   SpO2 99%   BMI 22.45 kg/m   General appearance: alert, cooperative, appears stated age, and no distress Head: Normocephalic, without obvious abnormality, atraumatic Eyes: negative findings: lids and lashes normal, conjunctivae and sclerae normal, corneas clear, and pupils equal, round, reactive to light and accomodation Ears: normal TM's and external ear canals both ears Nose: Nares normal. Septum midline. Mucosa normal. No drainage or sinus tenderness. Throat: lips, mucosa, and tongue normal; teeth and gums normal Neck: no adenopathy, supple, symmetrical, trachea midline, and thyroid not enlarged, symmetric, no tenderness/mass/nodules Back: symmetric, no curvature. ROM normal. No CVA tenderness. Lungs: clear to auscultation bilaterally Heart: regular rate and rhythm, S1, S2 normal, no murmur, click, rub or gallop Abdomen:  Soft, minimal tenderness palpation in the left lower and upper quadrant.  No rebound or guarding.  Flat Extremities: extremities normal, atraumatic, no cyanosis or edema Pulses: 2+ and symmetric Skin: Skin color, texture, turgor normal. No rashes or lesions Lymph nodes: Cervical, supraclavicular, and axillary nodes normal. Neurologic: Grossly normal      06/25/2023    7:56 AM 01/22/2023    3:26 PM 12/31/2022    9:57 AM  Depression screen PHQ 2/9  Decreased Interest 0 0 0  Down, Depressed, Hopeless 0 0 0  PHQ - 2 Score 0 0 0  Altered sleeping 0 1 1  Tired, decreased energy 0 1 1  Change in appetite 0 1 0  Feeling bad or failure about yourself  0 0 0  Trouble concentrating 0 0 0  Moving slowly or fidgety/restless 0 0 0  Suicidal thoughts 0 0 0  PHQ-9 Score 0 3 2  Difficult doing work/chores Not difficult at all  Not difficult at all      06/25/2023    7:55 AM 01/22/2023    3:25 PM 05/23/2022   11:21 AM 01/05/2022    1:56 PM  GAD 7 : Generalized Anxiety Score  Nervous, Anxious, on Edge 0 0 0 0  Control/stop worrying 0 0 0 0  Worry too much - different things 0 0 0 0  Trouble relaxing  0 0  0 0  Restless 0 0 0 0  Easily annoyed or irritable 0 0 0 0  Afraid - awful might happen 0 0 0 0  Total GAD 7 Score 0 0 0 0  Anxiety Difficulty Not difficult at all Not difficult at all Not difficult at all Not difficult at all     Assessment/ Plan: [name] here for annual physical exam.   Annual physical exam  Osteoporosis of femur without pathological fracture - Plan: CMP14+EGFR, VITAMIN D 25 Hydroxy (Vit-D Deficiency, Fractures)  Breast cancer metastasized to axillary lymph node, right (HCC) - Plan: CMP14+EGFR, CBC  Pure hypercholesterolemia - Plan: CMP14+EGFR, Lipid Panel  Pre-diabetes - Plan: Bayer DCA Hb A1c Waived  History of recurrent deep vein thrombosis (DVT) - Plan: CBC  Chronic anticoagulation - Plan: CBC  Acute nonintractable headache, unspecified headache type  Alternating constipation and diarrhea  Trigger finger of right thumb  Continue treatment as directed with hematology/oncology.  I will check her calcium, vitamin D and renal function and CC to her specialist as FYI as she is currently on zoledronic acid  She will continue to follow-up with her specialist.  Check CBC.  She continues to take Arimidex as prescribed  Fasting lipid, liver function test collected today.  A1c was normal range  Continue chronic anticoagulation as prescribed until 3 days prior to surgery.  Avoid concomitant use of NSAIDs given increased risk of bleeding  No barriers to surgery.  Okay to use ibuprofen for 3 days leading up that she will be off of her anticoagulant  Headache uncertain etiology.  Possibly sinus and seasonal related.  Given her alternating constipation with diarrhea, we could consider starting a tricyclic antidepressant to help with IBS type symptoms and as a preventative for ongoing headaches.  Will CC to her hematologist/oncologist as FYI  Counseled on healthy lifestyle choices, including diet (rich in fruits, vegetables and lean meats and low in salt  and simple carbohydrates) and exercise (at least 30 minutes of moderate physical activity daily).  Patient to follow up 1year for CPE  Fina Heizer M. Nadine Counts, DO

## 2023-06-25 NOTE — Patient Instructions (Signed)
 Visit Information  Thank you for taking time to visit with me today. Please don't hesitate to contact me if I can be of assistance to you.   Following are the goals we discussed today:   Goals Addressed               This Visit's Progress     Patient Stated     Improve and maintain her weight and blood pressure to assist with healing from chemo & diverticulosis (THN) (pt-stated)   On track     Interventions Today    Flowsheet Row Most Recent Value  General Interventions   General Interventions Discussed/Reviewed General Interventions Reviewed, Doctor Visits  [surgery, hypotension on 08/08/22, hypertension, nausea]  Doctor Visits Discussed/Reviewed Doctor Visits Reviewed, Specialist  PCP/Specialist Visits Compliance with follow-up visit  Education Interventions   Education Provided Provided Education  [hypotension/low BP precautions]  Provided Verbal Education On Sick Day Rules, Other, When to see the doctor, Medication  Mental Health Interventions   Mental Health Discussed/Reviewed Mental Health Reviewed, Coping Strategies  Nutrition Interventions   Nutrition Discussed/Reviewed Nutrition Reviewed, Fluid intake  Pharmacy Interventions   Pharmacy Dicussed/Reviewed Pharmacy Topics Reviewed, Medications and their functions  Safety Interventions   Safety Discussed/Reviewed Safety Discussed, Home Safety  Home Safety Assistive Devices              Our next appointment is by telephone on pending at pending  Please call the care guide team at [phone] if you need to cancel or reschedule your appointment.   If you are experiencing a Mental Health or Behavioral Health Crisis or need someone to talk to, please call the Suicide and Crisis Lifeline: 988 call the [place] National Suicide Prevention Lifeline: 708-313-2036 or TTY: 234-547-6052 TTY 8192984807) to talk to a trained counselor call 1-800-273-TALK (toll free, 24 hour hotline) call the [facility]:  [phone] call 911   Patient verbalizes understanding of instructions and care plan provided today and agrees to view in MyChart. Active MyChart status and patient understanding of how to access instructions and care plan via MyChart confirmed with patient.     The patient has been provided with contact information for the care management team and has been advised to call with any health related questions or concerns.   Isabell Bonafede L. Noelle Penner, RN, BSN, CCM [facility] RN Care Manager [phone]

## 2023-06-25 NOTE — Progress Notes (Signed)
 RN successfully faxed medical clearance with instructions to hold Xarelto 48 hrs prior to upcoming right thumb and long finger trigger release surgery and resume day after to [facility].

## 2023-06-25 NOTE — Telephone Encounter (Signed)
 Stephanie Brewer dropped off FMLA forms to be completed and signed.  Form Fee Paid? (Y/N)    yes        If NO, form is placed on front office manager desk to hold until payment received. If YES, then form will be placed in the RX/HH Nurse Coordinators box for completion.  Form will not be processed until payment is received   Call pt, when forms are ready!

## 2023-06-26 ENCOUNTER — Other Ambulatory Visit: Payer: Self-pay | Admitting: Family Medicine

## 2023-06-26 ENCOUNTER — Telehealth: Payer: Self-pay | Admitting: Gastroenterology

## 2023-06-26 DIAGNOSIS — E559 Vitamin D deficiency, unspecified: Secondary | ICD-10-CM

## 2023-06-26 DIAGNOSIS — M810 Age-related osteoporosis without current pathological fracture: Secondary | ICD-10-CM

## 2023-06-26 LAB — CMP14+EGFR
ALT: 9 IU/L (ref 0–32)
AST: 15 IU/L (ref 0–40)
Albumin: 4.1 g/dL (ref 3.8–4.9)
Alkaline Phosphatase: 62 IU/L (ref 44–121)
BUN/Creatinine Ratio: 16 (ref 9–23)
BUN: 11 mg/dL (ref 6–24)
Bilirubin Total: 0.5 mg/dL (ref 0.0–1.2)
CO2: 23 mmol/L (ref 20–29)
Calcium: 9.2 mg/dL (ref 8.7–10.2)
Chloride: 103 mmol/L (ref 96–106)
Creatinine, Ser: 0.69 mg/dL (ref 0.57–1.00)
Globulin, Total: 2.1 g/dL (ref 1.5–4.5)
Glucose: 78 mg/dL (ref 70–99)
Potassium: 3.7 mmol/L (ref 3.5–5.2)
Sodium: 141 mmol/L (ref 134–144)
Total Protein: 6.2 g/dL (ref 6.0–8.5)
eGFR: 101 mL/min/{1.73_m2} (ref >59)

## 2023-06-26 LAB — CBC
Hematocrit: 39.6 % (ref 34.0–46.6)
Hemoglobin: 13.2 g/dL (ref 11.1–15.9)
MCH: 30.4 pg (ref 26.6–33.0)
MCHC: 33.3 g/dL (ref 31.5–35.7)
MCV: 91 fL (ref 79–97)
Platelets: 220 10*3/uL (ref 150–450)
RBC: 4.34 x10E6/uL (ref 3.77–5.28)
RDW: 12.2 % (ref 11.7–15.4)
WBC: 3.4 10*3/uL (ref 3.4–10.8)

## 2023-06-26 LAB — LIPID PANEL
Cholesterol, Total: 184 mg/dL (ref 100–199)
HDL: 70 mg/dL (ref >39)
LDL CALC COMMENT:: 2.6 ratio (ref 0.0–4.4)
LDL Chol Calc (NIH): 102 mg/dL — ABNORMAL HIGH (ref 0–99)
Triglycerides: 63 mg/dL (ref 0–149)
VLDL Cholesterol Cal: 12 mg/dL (ref 5–40)

## 2023-06-26 LAB — VITAMIN D 25 HYDROXY (VIT D DEFICIENCY, FRACTURES): Vit D, 25-Hydroxy: 26.4 ng/mL — ABNORMAL LOW (ref 30.0–100.0)

## 2023-06-26 MED ORDER — VITAMIN D (ERGOCALCIFEROL) 1.25 MG (50000 UNIT) PO CAPS: 50000.0000 [IU] | ORAL_CAPSULE | ORAL | 3 refills | Status: AC

## 2023-06-26 NOTE — Telephone Encounter (Signed)
 PCP completed and signed FMLA forms. They have been faxed to [employer] pt to pick them up. Patient has been contacted and informed they are complete.

## 2023-06-26 NOTE — Telephone Encounter (Signed)
 Good morning Dr. Chales Abrahams, I received a call from this patient stating that she was ready to schedule her colonoscopy. Patient had a last colonoscopy in April of 2024. Would you please advise on scheduling. Thank you.

## 2023-07-02 NOTE — Telephone Encounter (Signed)
 S/P robotic sigmoid resection 09/2022 Poor prep during previous colonoscopy  Plan: Proceed with colonoscopy directly with me at [facility] Need to hold Xarelto 1 day prior.  Please get clearance from PCP regarding that RG

## 2023-07-03 ENCOUNTER — Encounter: Payer: Self-pay | Admitting: Family Medicine

## 2023-07-03 ENCOUNTER — Telehealth: Payer: Self-pay | Admitting: Family Medicine

## 2023-07-03 NOTE — Telephone Encounter (Signed)
 Sent to Dr Chales Abrahams

## 2023-07-03 NOTE — Telephone Encounter (Unsigned)
 Copied from CRM [id number]. Topic: General - Other >> Jul 03, 2023  8:23 AM Clayton Bibles wrote: Reason for CRM:  Dr. Chales Abrahams, Physician Gastroenterology needs a letter or statement to clear Stephanie Brewer for a colonoscopy. Please fax letter/statement to [phone]. They need this before they can schedule the colonoscopy. Thanks

## 2023-07-03 NOTE — Telephone Encounter (Signed)
**Note De-identified  [place] Obfuscation** Please advise 

## 2023-07-04 NOTE — Telephone Encounter (Signed)
 Pt aware letter was sent and she has already been contacted by his office.

## 2023-07-23 DIAGNOSIS — L03032 Cellulitis of left toe: Secondary | ICD-10-CM | POA: Diagnosis not present

## 2023-07-29 ENCOUNTER — Encounter (HOSPITAL_BASED_OUTPATIENT_CLINIC_OR_DEPARTMENT_OTHER): Payer: Self-pay | Admitting: Orthopedic Surgery

## 2023-07-29 ENCOUNTER — Other Ambulatory Visit: Payer: Self-pay

## 2023-08-02 ENCOUNTER — Ambulatory Visit (HOSPITAL_BASED_OUTPATIENT_CLINIC_OR_DEPARTMENT_OTHER)
Admission: RE | Admit: 2023-08-02 | Discharge: 2023-08-02 | Disposition: A | Discharge status: Home / Self Care | Attending: Orthopedic Surgery | Admitting: Orthopedic Surgery

## 2023-08-02 ENCOUNTER — Other Ambulatory Visit: Payer: Self-pay

## 2023-08-02 ENCOUNTER — Encounter (HOSPITAL_BASED_OUTPATIENT_CLINIC_OR_DEPARTMENT_OTHER): Payer: Self-pay | Admitting: Orthopedic Surgery

## 2023-08-02 ENCOUNTER — Ambulatory Visit (HOSPITAL_BASED_OUTPATIENT_CLINIC_OR_DEPARTMENT_OTHER): Payer: Self-pay | Admitting: Anesthesiology

## 2023-08-02 ENCOUNTER — Encounter (HOSPITAL_BASED_OUTPATIENT_CLINIC_OR_DEPARTMENT_OTHER)
Admission: RE | Disposition: A | Discharge status: Home / Self Care | Payer: Self-pay | Source: Home / Self Care | Attending: Orthopedic Surgery

## 2023-08-02 DIAGNOSIS — M65331 Trigger finger, right middle finger: Secondary | ICD-10-CM | POA: Diagnosis not present

## 2023-08-02 DIAGNOSIS — K219 Gastro-esophageal reflux disease without esophagitis: Secondary | ICD-10-CM | POA: Diagnosis not present

## 2023-08-02 DIAGNOSIS — M65311 Trigger thumb, right thumb: Secondary | ICD-10-CM | POA: Diagnosis not present

## 2023-08-02 DIAGNOSIS — Z86718 Personal history of other venous thrombosis and embolism: Secondary | ICD-10-CM | POA: Insufficient documentation

## 2023-08-02 DIAGNOSIS — Z7901 Long term (current) use of anticoagulants: Secondary | ICD-10-CM | POA: Insufficient documentation

## 2023-08-02 HISTORY — PX: TRIGGER FINGER RELEASE: SHX641

## 2023-08-02 LAB — GLUCOSE, CAPILLARY: Glucose-Capillary: 77 mg/dL (ref 70–99)

## 2023-08-02 SURGERY — RELEASE, A1 PULLEY, FOR TRIGGER FINGER
Anesthesia: General | Site: Finger | Laterality: Right

## 2023-08-02 MED ORDER — ONDANSETRON HCL 4 MG/2ML IJ SOLN
INTRAMUSCULAR | Status: DC | PRN
  Administered 2023-08-02: 4 mg via INTRAVENOUS

## 2023-08-02 MED ORDER — LACTATED RINGERS IV SOLN: INTRAVENOUS | Status: DC

## 2023-08-02 MED ORDER — MIDAZOLAM HCL 5 MG/5ML IJ SOLN
INTRAMUSCULAR | Status: DC | PRN
  Administered 2023-08-02: 2 mg via INTRAVENOUS

## 2023-08-02 MED ORDER — ACETAMINOPHEN 500 MG PO TABS
ORAL_TABLET | ORAL | Status: AC
  Filled 2023-08-02: qty 2

## 2023-08-02 MED ORDER — ACETAMINOPHEN 500 MG PO TABS
1000.0000 mg | ORAL_TABLET | Freq: Once | ORAL | Status: AC
  Administered 2023-08-02: 1000 mg via ORAL

## 2023-08-02 MED ORDER — CEFAZOLIN SODIUM-DEXTROSE 2-4 GM/100ML-% IV SOLN
2.0000 g | INTRAVENOUS | Status: AC
  Administered 2023-08-02: 2 g via INTRAVENOUS

## 2023-08-02 MED ORDER — DEXAMETHASONE SODIUM PHOSPHATE 10 MG/ML IJ SOLN
INTRAMUSCULAR | Status: AC
Start: 2023-08-02 — End: ?
  Filled 2023-08-02: qty 1

## 2023-08-02 MED ORDER — ONDANSETRON HCL 4 MG/2ML IJ SOLN
INTRAMUSCULAR | Status: AC
  Filled 2023-08-02: qty 2

## 2023-08-02 MED ORDER — LIDOCAINE 2% (20 MG/ML) 5 ML SYRINGE
INTRAMUSCULAR | Status: AC
  Filled 2023-08-02: qty 5

## 2023-08-02 MED ORDER — 0.9 % SODIUM CHLORIDE (POUR BTL) OPTIME
TOPICAL | Status: DC | PRN
  Administered 2023-08-02: 50 mL

## 2023-08-02 MED ORDER — MIDAZOLAM HCL 2 MG/2ML IJ SOLN
INTRAMUSCULAR | Status: AC
  Filled 2023-08-02: qty 2

## 2023-08-02 MED ORDER — FENTANYL CITRATE (PF) 100 MCG/2ML IJ SOLN: 25.0000 ug | INTRAMUSCULAR | Status: DC | PRN

## 2023-08-02 MED ORDER — ONDANSETRON HCL 4 MG/2ML IJ SOLN: 4.0000 mg | Freq: Once | INTRAMUSCULAR | Status: DC | PRN

## 2023-08-02 MED ORDER — LIDOCAINE HCL (CARDIAC) PF 100 MG/5ML IV SOSY
PREFILLED_SYRINGE | INTRAVENOUS | Status: DC | PRN
  Administered 2023-08-02: 60 mg via INTRAVENOUS

## 2023-08-02 MED ORDER — BUPIVACAINE HCL (PF) 0.25 % IJ SOLN
INTRAMUSCULAR | Status: AC
  Filled 2023-08-02: qty 30

## 2023-08-02 MED ORDER — AMISULPRIDE (ANTIEMETIC) 5 MG/2ML IV SOLN: 10.0000 mg | Freq: Once | INTRAVENOUS | Status: DC | PRN

## 2023-08-02 MED ORDER — FENTANYL CITRATE (PF) 100 MCG/2ML IJ SOLN
INTRAMUSCULAR | Status: AC
  Filled 2023-08-02: qty 2

## 2023-08-02 MED ORDER — CEFAZOLIN SODIUM-DEXTROSE 2-4 GM/100ML-% IV SOLN
INTRAVENOUS | Status: AC
  Filled 2023-08-02: qty 100

## 2023-08-02 MED ORDER — BUPIVACAINE HCL (PF) 0.25 % IJ SOLN
INTRAMUSCULAR | Status: DC | PRN
  Administered 2023-08-02: 9 mL

## 2023-08-02 MED ORDER — TRAMADOL HCL 50 MG PO TABS: 50.0000 mg | ORAL_TABLET | Freq: Four times a day (QID) | ORAL | 0 refills | Status: DC | PRN

## 2023-08-02 MED ORDER — DEXAMETHASONE SODIUM PHOSPHATE 10 MG/ML IJ SOLN
INTRAMUSCULAR | Status: DC | PRN
  Administered 2023-08-02: 4 mg via INTRAVENOUS

## 2023-08-02 MED ORDER — PROPOFOL 10 MG/ML IV BOLUS
INTRAVENOUS | Status: AC
  Filled 2023-08-02: qty 20

## 2023-08-02 MED ORDER — PROPOFOL 10 MG/ML IV BOLUS
INTRAVENOUS | Status: DC | PRN
  Administered 2023-08-02: 160 mg via INTRAVENOUS
  Administered 2023-08-02: 40 mg via INTRAVENOUS

## 2023-08-02 MED ORDER — FENTANYL CITRATE (PF) 100 MCG/2ML IJ SOLN
INTRAMUSCULAR | Status: DC | PRN
  Administered 2023-08-02 (×2): 50 ug via INTRAVENOUS

## 2023-08-02 SURGICAL SUPPLY — 31 items
BLADE SURG 15 STRL LF DISP TIS (BLADE) ×2 IMPLANT
BNDG COHESIVE 2X5 TAN ST LF (GAUZE/BANDAGES/DRESSINGS) ×1 IMPLANT
BNDG ESMARK 4X9 LF (GAUZE/BANDAGES/DRESSINGS) IMPLANT
CHLORAPREP W/TINT 26 (MISCELLANEOUS) ×1 IMPLANT
CORD BIPOLAR FORCEPS 12FT (ELECTRODE) ×1 IMPLANT
COVER BACK TABLE 60X90IN (DRAPES) ×2 IMPLANT
COVER MAYO STAND STRL (DRAPES) ×1 IMPLANT
CUFF TOURN SGL QUICK 18X4 (TOURNIQUET CUFF) ×1 IMPLANT
DRAPE EXTREMITY T 121X128X90 (DISPOSABLE) ×1 IMPLANT
DRAPE SURG 17X23 STRL (DRAPES) ×1 IMPLANT
GAUZE SPONGE 4X4 12PLY STRL (GAUZE/BANDAGES/DRESSINGS) ×1 IMPLANT
GAUZE XEROFORM 1X8 LF (GAUZE/BANDAGES/DRESSINGS) ×1 IMPLANT
GLOVE BIO SURGEON STRL SZ7.5 (GLOVE) ×1 IMPLANT
GLOVE BIOGEL PI IND STRL 7.0 (GLOVE) IMPLANT
GLOVE BIOGEL PI IND STRL 7.5 (GLOVE) IMPLANT
GLOVE BIOGEL PI IND STRL 8 (GLOVE) ×1 IMPLANT
GLOVE SURG SS PI 6.5 STRL IVOR (GLOVE) IMPLANT
GLOVE SURG SS PI 7.0 STRL IVOR (GLOVE) IMPLANT
GOWN STRL REUS W/ TWL LRG LVL3 (GOWN DISPOSABLE) ×1 IMPLANT
GOWN STRL REUS W/ TWL XL LVL3 (GOWN DISPOSABLE) IMPLANT
GOWN STRL REUS W/TWL XL LVL3 (GOWN DISPOSABLE) ×1 IMPLANT
NDL HYPO 25X1 1.5 SAFETY (NEEDLE) ×2 IMPLANT
NEEDLE HYPO 25X1 1.5 SAFETY (NEEDLE) ×1 IMPLANT
NS IRRIG 1000ML POUR BTL (IV SOLUTION) ×1 IMPLANT
PACK BASIN DAY SURGERY FS (CUSTOM PROCEDURE TRAY) ×2 IMPLANT
STOCKINETTE 4X48 STRL (DRAPES) ×1 IMPLANT
SUT ETHILON 4 0 PS 2 18 (SUTURE) ×1 IMPLANT
SYR BULB EAR ULCER 3OZ GRN STR (SYRINGE) ×1 IMPLANT
SYR CONTROL 10ML LL (SYRINGE) ×2 IMPLANT
TOWEL GREEN STERILE FF (TOWEL DISPOSABLE) ×4 IMPLANT
UNDERPAD 30X36 HEAVY ABSORB (UNDERPADS AND DIAPERS) ×2 IMPLANT

## 2023-08-02 NOTE — Anesthesia Preprocedure Evaluation (Addendum)
 Anesthesia Evaluation  Patient identified by MRN, date of birth, ID band Patient awake    Reviewed: Allergy & Precautions, NPO status , Patient's Chart, lab work & pertinent test results  Airway Mallampati: II  TM Distance: >3 FB Neck ROM: Full    Dental  (+) Teeth Intact, Dental Advisory Given   Pulmonary neg pulmonary ROS   Pulmonary exam normal breath sounds clear to auscultation       Cardiovascular + DVT  Normal cardiovascular exam Rhythm:Regular Rate:Normal     Neuro/Psych  PSYCHIATRIC DISORDERS Anxiety     negative neurological ROS     GI/Hepatic Neg liver ROS,GERD  ,,  Endo/Other  negative endocrine ROS    Renal/GU negative Renal ROS     Musculoskeletal negative musculoskeletal ROS (+)  Trigger thumb of right hand Trigger finger, right middle finger     Abdominal   Peds  Hematology  (+) Blood dyscrasia (Xarelto )   Anesthesia Other Findings Day of surgery medications reviewed with the patient.  H/o breast cancer   Reproductive/Obstetrics                             Anesthesia Physical Anesthesia Plan  ASA: 2  Anesthesia Plan: General   Post-op Pain Management: Tylenol  PO (pre-op)*   Induction: Intravenous  PONV Risk Score and Plan: 3 and Dexamethasone , Ondansetron  and Midazolam   Airway Management Planned: LMA  Additional Equipment:   Intra-op Plan:   Post-operative Plan: Extubation in OR  Informed Consent: I have reviewed the patients History and Physical, chart, labs and discussed the procedure including the risks, benefits and alternatives for the proposed anesthesia with the patient or authorized representative who has indicated his/her understanding and acceptance.     Dental advisory given  Plan Discussed with: CRNA  Anesthesia Plan Comments:        Anesthesia Quick Evaluation

## 2023-08-02 NOTE — Anesthesia Procedure Notes (Signed)
 Procedure Name: LMA Insertion Date/Time: 08/02/2023 1:35 PM  Performed by: Arvilla Birmingham, CRNAPre-anesthesia Checklist: Patient identified, Emergency Drugs available, Suction available and Patient being monitored Patient Re-evaluated:Patient Re-evaluated prior to induction Oxygen Delivery Method: Circle system utilized Preoxygenation: Pre-oxygenation with 100% oxygen Induction Type: IV induction Ventilation: Mask ventilation without difficulty LMA: LMA inserted LMA Size: 4.0 Number of attempts: 1 Airway Equipment and Method: Bite block Placement Confirmation: positive ETCO2 Tube secured with: Tape Dental Injury: Teeth and Oropharynx as per pre-operative assessment

## 2023-08-02 NOTE — H&P (Signed)
 Stephanie Brewer is an 58 y.o. female.   Chief Complaint: trigger digit HPI: 58 yo female with triggering right thumb and long fingers.  These have been injected without lasting resolution.  She wishes to proceed with surgical trigger release.  Allergies:  Allergies  Allergen Reactions   Hyoscyamine Anaphylaxis   Fosamax  [Alendronate ] Nausea Only and Other (See Comments)    SEVERE NAUSEA   Nexium [Esomeprazole] Swelling and Other (See Comments)    Tongue became swollen- breathing not affected   Doxycycline Palpitations and Other (See Comments)    Racing heart    Past Medical History:  Diagnosis Date   Anemia    Anxiety    Breast cancer (HCC)    Clotting disorder (HCC)    Displacement of breast implant 12/24/2017   Diverticulitis    Diverticulosis    DVT (deep venous thrombosis) (HCC)    left leg knee and ankle    GERD (gastroesophageal reflux disease)    History of kidney stones    Kidney stones    Pneumonia    Pre-diabetes    Sigmoid diverticulitis 08/17/2022    Past Surgical History:  Procedure Laterality Date   BREAST BIOPSY Left 08/15/2021   BREAST ENHANCEMENT SURGERY     Augmentation 2003   BREAST IMPLANT REMOVAL Bilateral 01/14/2020   Procedure: REMOVAL BREAST IMPLANTS;  Surgeon: Barb Bonito, MD;  Location: [facility];  Service: Plastics;  Laterality: Bilateral;   BREAST LUMPECTOMY Right 01/2020   BREAST LUMPECTOMY WITH RADIOACTIVE SEED AND SENTINEL LYMPH NODE BIOPSY Right 01/14/2020   Procedure: Right breast seed localized lumpectomy with sentinel lymph node biopsy;  Surgeon: Enid Harry, MD;  Location: [facility];  Service: General;  Laterality: Right;   BUNIONECTOMY Bilateral 2005   Right foot in 2006   COLON RESECTION  08/2022   LIPOSUCTION WITH LIPOFILLING Bilateral 11/29/2020   Procedure: LIPOSUCTION WITH LIPOFILLING FROM ABDOMEN TO BILATERAL BREASTS;  Surgeon: Barb Bonito, MD;  Location: [facility];  Service: Plastics;  Laterality: Bilateral;   MASTOPEXY Left 11/29/2020   Procedure: LEFT MASTOPEXY;  Surgeon: Barb Bonito, MD;  Location: [facility];  Service: Plastics;  Laterality: Left;   PORTACATH PLACEMENT N/A 09/01/2019   Procedure: INSERTION PORT-A-CATH WITH ULTRASOUND GUIDANCE;  Surgeon: Enid Harry, MD;  Location: [facility];  Service: General;  Laterality: N/A;   PROCTOSCOPY N/A 08/17/2022   Procedure: RIGID PROCTOSCOPY;  Surgeon: Candyce Champagne, MD;  Location: [facility];  Service: General;  Laterality: N/A;   TRIGGER FINGER RELEASE Left 05/09/2020   Procedure: RELEASE TRIGGER FINGER/A-1 PULLEY LEFT LONG;  Surgeon: Brunilda Capra, MD;  Location:  SURGERY CENTER;  Service: Orthopedics;  Laterality: Left;   UMBILICAL HERNIA REPAIR  2002    Family History: Family History  Problem Relation Age of Onset   Hyperlipidemia Mother    Polymyalgia rheumatica Mother    Hypertension Father    Hyperlipidemia Father    Diabetes type I Brother    Ovarian cancer Maternal Aunt    Diabetes type II Maternal Grandfather    Allergies Daughter    Colon cancer Neg Hx    Esophageal cancer Neg Hx    Rectal cancer Neg Hx    Stomach cancer Neg Hx     Social History:   reports that she has never smoked. She has never used smokeless tobacco. She reports current alcohol use. She reports that she does not use drugs.  Medications:  Medications Prior to Admission  Medication Sig Dispense Refill   anastrozole  (ARIMIDEX ) 1 MG tablet Take 1 tablet (1 mg total) by mouth at bedtime. 90 tablet 3   minocycline (MINOCIN) 100 MG capsule Take 100 mg by mouth daily.     Vitamin D , Ergocalciferol , (DRISDOL ) 1.25 MG (50000 UNIT) CAPS capsule Take 1 capsule (50,000 Units total) by mouth every 7 (seven) days. 12 capsule 3   XARELTO  20 MG TABS tablet TAKE ONE TABLET ONCE DAILY WITH SUPPER 30 tablet 12   Calcium Carb-Cholecalciferol (CALCIUM 500 +D PO) Take by  mouth.     glucose blood test strip UAD to check sugar. R73.03 100 each 12   Lancets Thin MISC UAD to check sugar. R73.03 100 each 12   meclizine  (ANTIVERT ) 12.5 MG tablet Take 1 tablet (12.5 mg total) by mouth 3 (three) times daily as needed for dizziness. 30 tablet 0   zoledronic  acid (ZOMETA ) 4 MG/5ML injection Inject 4 mg into the vein once. W/ hematology/oncology      Results for orders placed or performed during the hospital encounter of 08/02/23 (from the past 48 hours)  Glucose, capillary     Status: None   Collection Time: 08/02/23 11:44 AM  Result Value Ref Range   Glucose-Capillary 77 70 - 99 mg/dL    Comment: Glucose reference range applies only to samples taken after fasting for at least 8 hours.   Comment 1 Notify RN    Comment 2 Document in Chart     No results found.    Blood pressure 116/73, pulse 74, temperature (!) 97.1 F (36.2 C), temperature source Temporal, resp. rate 18, height 5\' 4"  (1.626 m), weight 61.6 kg, SpO2 100%.  General appearance: alert, cooperative, and appears stated age Head: Normocephalic, without obvious abnormality, atraumatic Neck: supple, symmetrical, trachea midline Extremities: Intact sensation and capillary refill all digits.  +epl/fpl/io.  No wounds.  Skin: Skin color, texture, turgor normal. No rashes or lesions Neurologic: Grossly normal Incision/Wound: none  Assessment/Plan Right thumb and long finger trigger digits.  Non operative and operative treatment options have been discussed with the patient and patient wishes to proceed with operative treatment. Risks, benefits, and alternatives of surgery have been discussed and the patient agrees with the plan of care.   Stephanie Brewer 08/02/2023, 1:15 PM

## 2023-08-02 NOTE — Discharge Instructions (Addendum)
 May take Tylenol  after 6 pm, if needed.    Post Anesthesia Home Care Instructions  Activity: Get plenty of rest for the remainder of the day. A responsible individual must stay with you for 24 hours following the procedure.  For the next 24 hours, DO NOT: -Drive a car -Advertising copywriter -Drink alcoholic beverages -Take any medication unless instructed by your physician -Make any legal decisions or sign important papers.  Meals: Start with liquid foods such as gelatin or soup. Progress to regular foods as tolerated. Avoid greasy, spicy, heavy foods. If nausea and/or vomiting occur, drink only clear liquids until the nausea and/or vomiting subsides. Call your physician if vomiting continues.  Special Instructions/Symptoms: Your throat may feel dry or sore from the anesthesia or the breathing tube placed in your throat during surgery. If this causes discomfort, gargle with warm salt water. The discomfort should disappear within 24 hours.  If you had a scopolamine  patch placed behind your ear for the management of post- operative nausea and/or vomiting:  1. The medication in the patch is effective for 72 hours, after which it should be removed.  Wrap patch in a tissue and discard in the trash. Wash hands thoroughly with soap and water. 2. You may remove the patch earlier than 72 hours if you experience unpleasant side effects which may include dry mouth, dizziness or visual disturbances. 3. Avoid touching the patch. Wash your hands with soap and water after contact with the patch.    Hand Center Instructions Hand Surgery  Wound Care: Keep your hand elevated above the level of your heart.  Do not allow it to dangle by your side.  Keep the dressing dry and do not remove it unless your doctor advises you to do so.  He will usually change it at the time of your post-op visit.  Moving your fingers is advised to stimulate circulation but will depend on the site of your surgery.  If you have a  splint applied, your doctor will advise you regarding movement.  Activity: Do not drive or operate machinery today.  Rest today and then you may return to your normal activity and work as indicated by your physician.  Diet:  Drink liquids today or eat a light diet.  You may resume a regular diet tomorrow.    General expectations: Pain for two to three days. Fingers may become slightly swollen.  Call your doctor if any of the following occur: Severe pain not relieved by pain medication. Elevated temperature. Dressing soaked with blood. Inability to move fingers. White or bluish color to fingers.

## 2023-08-02 NOTE — Op Note (Addendum)
 08/02/2023 [facility] OPERATIVE REPORT   PREOPERATIVE DIAGNOSIS: Right trigger thumb and long finger trigger digit  POSTOPERATIVE DIAGNOSIS:  Right trigger thumb and long finger trigger digit  PROCEDURE: 1. Right trigger thumb release 2. Right long finger trigger release  SURGEON:  Kelia Gibbon, MD  ASSISTANT:  None.  ANESTHESIA:  General.  IV FLUIDS:  Per anesthesia flow sheet.  ESTIMATED BLOOD LOSS:  Minimal.  COMPLICATIONS:  None.  SPECIMENS:  None.  TOURNIQUET TIME:  Total Tourniquet Time Documented: Forearm (Right) - 12 minutes Total: Forearm (Right) - 12 minutes   DISPOSITION:  Stable to PACU.  LOCATION: [facility]  INDICATIONS: Stephanie Brewer is a 58 y.o. female with triggering of the long finger, thumb.  This has been injected without lasting resolution.  She wishes to proceed with surgical trigger release.  Risks, benefits and alternatives of surgery were discussed including the risk of blood loss, infection, damage to nerves, vessels, tendons, ligaments, bone, failure of surgery, need for additional surgery, complications with wound healing, continued pain, continued triggering and need for repeat surgery.  She voiced understanding of these risks and elected to proceed.  OPERATIVE COURSE:  After being identified preoperatively by myself, the patient and I agreed upon the procedure and site of procedure.  The surgical site was marked. Surgical consent had been signed. She was given IV Ancef  as preoperative antibiotic prophylaxis. She was transported to the operating room and placed on the operating room table in supine position with the Right upper extremity on an arm board. General anesthesia was induced by the anesthesiologist.  The Right upper extremity was prepped and draped in normal sterile orthopedic fashion. A surgical pause was performed between surgeons, anesthesia, and operating room staff, and all were in agreement as to the  patient, procedure, and site of procedure.  Tourniquet at the proximal aspect of the forearm was inflated to 250 mmHg after exsanguination of the arm with an Esmarch bandage.  An incision was made transversely at the MP flexion crease of the thumb.  This was made through the skin only.  Spreading technique was used.  The radial and ulnar digital nerves were identified and were protected throughout the case. The flexor sheath was identified.  The A1 pulley was identified.  It was sharply incised.  It was released in its entirety.  Care was taken to avoid any release of the oblique pulley. The thumb was placed through a range of motion, there was noted to be no catching.  The wound was copiously irrigated with sterile saline. It was then closed with 4-0 nylon in a horizontal mattress fashion.  An incision was made at the volar aspect of the MP joint of the long finger.  This was carried into the subcutaneous tissues by spreading technique.  Bipolar electrocautery was used to obtain hemostasis.  The radial and ulnar digital nerves were protected throughout the case. The flexor sheath was identified.  The A1 pulley was identified and sharply incised.  It was released in its entirety.  The proximal 1-2 mm of the A2 pulley was vented to allow better excursion of the tendons.  The finger was placed through a range of motion and there was noted to be no catching.  The tendons were brought through the wound and any adherences released.  The wound was then copiously irrigated with sterile saline. It was closed with 4-0 nylon in a horizontal mattress fashion.  The wound was injected with  0.25% plain  Marcaine  to aid in postoperative analgesia.  It was then dressed with sterile Xeroform, 4x4s, and wrapped lightly with a Coban dressing.  Tourniquet was deflated at 12 minutes.  The fingertips were pink with brisk capillary refill after deflation of the tourniquet.  The operative drapes were broken down and the patient was awoken  from anesthesia safely.  She was transferred back to the stretcher and taken to the PACU in stable condition.   I will see her back in the office in 1 week for postoperative followup.  I will give her a prescription for Tramadol  50 mg 1 tab PO q6 hours prn pain, dispense # 20.    Zelena Bushong, MD Electronically signed, 08/02/23

## 2023-08-02 NOTE — Anesthesia Postprocedure Evaluation (Signed)
 Anesthesia Post Note  Patient: Stephanie Brewer  Procedure(s) Performed: RIGHT THUMB AND RIGHT LONG FINGER A1 PULLEY TRIGGER RELEASE (Right: Finger)     Patient location during evaluation: PACU Anesthesia Type: General Level of consciousness: awake and alert Pain management: pain level controlled Vital Signs Assessment: post-procedure vital signs reviewed and stable Respiratory status: spontaneous breathing, nonlabored ventilation and respiratory function stable Cardiovascular status: stable and blood pressure returned to baseline Anesthetic complications: no   No notable events documented.  Last Vitals:  Vitals:   08/02/23 1428 08/02/23 1450  BP: 118/72 120/70  Pulse: 76 70  Resp: 14 16  Temp:  36.5 C  SpO2: 96% 99%    Last Pain:  Vitals:   08/02/23 1450  TempSrc:   PainSc: 0-No pain                 Juventino Oppenheim

## 2023-08-02 NOTE — Transfer of Care (Signed)
 Immediate Anesthesia Transfer of Care Note  Patient: Stephanie Brewer  Procedure(s) Performed: RIGHT THUMB AND RIGHT LONG FINGER A1 PULLEY TRIGGER RELEASE (Right: Finger)  Patient Location: PACU  Anesthesia Type:General  Level of Consciousness: sedated  Airway & Oxygen Therapy: Patient Spontanous Breathing and Patient connected to face mask oxygen  Post-op Assessment: Report given to RN and Post -op Vital signs reviewed and stable  Post vital signs: Reviewed and stable  Last Vitals:  Vitals Value Taken Time  BP 101/62 08/02/23 1411  Temp    Pulse 67 08/02/23 1414  Resp 10 08/02/23 1414  SpO2 99 % 08/02/23 1414  Vitals shown include unfiled device data.  Last Pain:  Vitals:   08/02/23 1154  TempSrc:   PainSc: 0-No pain         Complications: No notable events documented.

## 2023-08-03 ENCOUNTER — Encounter (HOSPITAL_BASED_OUTPATIENT_CLINIC_OR_DEPARTMENT_OTHER): Payer: Self-pay | Admitting: Orthopedic Surgery

## 2023-08-09 DIAGNOSIS — M65311 Trigger thumb, right thumb: Secondary | ICD-10-CM | POA: Diagnosis not present

## 2023-08-09 DIAGNOSIS — M65331 Trigger finger, right middle finger: Secondary | ICD-10-CM | POA: Diagnosis not present

## 2023-08-12 ENCOUNTER — Encounter (HOSPITAL_COMMUNITY): Payer: Self-pay

## 2023-08-16 DIAGNOSIS — M65331 Trigger finger, right middle finger: Secondary | ICD-10-CM | POA: Diagnosis not present

## 2023-08-16 DIAGNOSIS — M65311 Trigger thumb, right thumb: Secondary | ICD-10-CM | POA: Diagnosis not present

## 2023-08-19 ENCOUNTER — Ambulatory Visit
Admission: RE | Admit: 2023-08-19 | Discharge: 2023-08-19 | Disposition: A | Discharge status: Home / Self Care | Source: Ambulatory Visit | Attending: Hematology and Oncology | Admitting: Hematology and Oncology

## 2023-08-19 DIAGNOSIS — Z1231 Encounter for screening mammogram for malignant neoplasm of breast: Secondary | ICD-10-CM

## 2023-08-27 ENCOUNTER — Inpatient Hospital Stay: Payer: BC Managed Care – PPO | Attending: Hematology | Admitting: Hematology and Oncology

## 2023-08-27 ENCOUNTER — Inpatient Hospital Stay: Payer: BC Managed Care – PPO

## 2023-08-27 VITALS — BP 132/70 | HR 64 | Temp 97.3°F | Resp 18 | Ht 64.0 in | Wt 133.1 lb

## 2023-08-27 VITALS — BP 118/71 | HR 66 | Temp 97.7°F | Resp 16

## 2023-08-27 DIAGNOSIS — Z780 Asymptomatic menopausal state: Secondary | ICD-10-CM

## 2023-08-27 DIAGNOSIS — R519 Headache, unspecified: Secondary | ICD-10-CM | POA: Diagnosis not present

## 2023-08-27 DIAGNOSIS — C50411 Malignant neoplasm of upper-outer quadrant of right female breast: Secondary | ICD-10-CM | POA: Diagnosis not present

## 2023-08-27 DIAGNOSIS — Z170 Estrogen receptor positive status [ER+]: Secondary | ICD-10-CM

## 2023-08-27 DIAGNOSIS — Z1732 Human epidermal growth factor receptor 2 negative status: Secondary | ICD-10-CM | POA: Insufficient documentation

## 2023-08-27 DIAGNOSIS — M810 Age-related osteoporosis without current pathological fracture: Secondary | ICD-10-CM | POA: Insufficient documentation

## 2023-08-27 DIAGNOSIS — Z1721 Progesterone receptor positive status: Secondary | ICD-10-CM | POA: Insufficient documentation

## 2023-08-27 LAB — BASIC METABOLIC PANEL - CANCER CENTER ONLY
Anion gap: 8 (ref 5–15)
BUN: 10 mg/dL (ref 6–20)
CO2: 26 mmol/L (ref 22–32)
Calcium: 9.1 mg/dL (ref 8.9–10.3)
Chloride: 107 mmol/L (ref 98–111)
Creatinine: 0.62 mg/dL (ref 0.44–1.00)
GFR, Estimated: >60 mL/min (ref >60)
Glucose, Bld: 95 mg/dL (ref 70–99)
Potassium: 4.2 mmol/L (ref 3.5–5.1)
Sodium: 141 mmol/L (ref 135–145)

## 2023-08-27 MED ORDER — ZOLEDRONIC ACID 4 MG/100ML IV SOLN
4.0000 mg | Freq: Once | INTRAVENOUS | Status: AC
  Administered 2023-08-27: 4 mg via INTRAVENOUS
  Filled 2023-08-27: qty 100

## 2023-08-27 MED ORDER — SODIUM CHLORIDE 0.9 % IV SOLN: INTRAVENOUS | Status: DC

## 2023-08-27 NOTE — Progress Notes (Signed)
 Patient Care Team: Eliodoro Guerin, DO as PCP - General (Family Medicine) Auther Bo, RN as Oncology Nurse Navigator Noble Bateman, Regino Caprio, RN as Oncology Nurse Navigator Adan Holms, Stan Eans, RN (Inactive) as Registered Nurse Anastasio Balsam, RN (Inactive) as Registered Nurse Cameron Cea, MD as Medical Oncologist (Hematology and Oncology) Althea Atkinson, RPH-CPP as Pharmacist (Hematology and Oncology) Candyce Champagne, MD as Consulting Physician (General Surgery) Lajuan Pila, MD as Consulting Physician (Gastroenterology)  DIAGNOSIS:  Encounter Diagnosis  Name Primary?   Malignant neoplasm of upper-outer quadrant of right breast in female, estrogen receptor positive (HCC) Yes    SUMMARY OF ONCOLOGIC HISTORY: Oncology History  Malignant neoplasm of upper-outer quadrant of right breast in female, estrogen receptor positive (HCC)  07/28/2019 Initial Diagnosis   Screening mammogram detected right breast mass 11:30 position subareolar 1.3 cm, indeterminate 5 mm mass: Biopsy fibroadenoma, right axillary lymph node present, biopsy of the lymph node and the mass revealed grade 2 IDC ER 10%, PR 0%, Ki-67 45%, HER-2 +3+ by IHC   08/20/2019 Cancer Staging   Staging form: Breast, AJCC 8th Edition - Clinical stage from 08/20/2019: Stage IIA (cT2, cN1, cM0, G2, ER+, PR-, HER2+) - Signed by Colie Dawes, MD on 01/26/2020   09/02/2019 - 12/18/2019 Chemotherapy   The patient had dexamethasone  (DECADRON ) 4 MG tablet, 4 mg (100 % of original dose 4 mg), Oral, Daily, 1 of 1 cycle, Start date: 08/20/2019, End date: 01/06/2020 Dose modification: 4 mg (original dose 4 mg, Cycle 0) palonosetron  (ALOXI ) injection 0.25 mg, 0.25 mg, Intravenous,  Once, 6 of 6 cycles Administration: 0.25 mg (09/02/2019), 0.25 mg (09/23/2019), 0.25 mg (11/25/2019), 0.25 mg (12/16/2019), 0.25 mg (10/14/2019), 0.25 mg (11/04/2019) pegfilgrastim -jmdb (FULPHILA ) injection 6 mg, 6 mg, Subcutaneous,  Once, 6 of 6 cycles Administration:  6 mg (09/04/2019), 6 mg (09/25/2019), 6 mg (11/27/2019), 6 mg (12/18/2019), 6 mg (10/16/2019), 6 mg (11/06/2019) CARBOplatin  (PARAPLATIN ) 700 mg in sodium chloride  0.9 % 250 mL chemo infusion, 700 mg (100 % of original dose 700 mg), Intravenous,  Once, 6 of 6 cycles Dose modification: 700 mg (original dose 700 mg, Cycle 1), 700 mg (original dose 700 mg, Cycle 5), 600 mg (original dose 700 mg, Cycle 5, Reason: Other (see comments), Comment: dose reduce to 600 mg for CINV per Dr. Zylen Wenig) Administration: 700 mg (09/02/2019), 700 mg (09/23/2019), 600 mg (11/25/2019), 600 mg (12/16/2019), 700 mg (10/14/2019), 600 mg (11/04/2019) DOCEtaxel  (TAXOTERE ) 150 mg in sodium chloride  0.9 % 250 mL chemo infusion, 75 mg/m2 = 150 mg, Intravenous,  Once, 6 of 6 cycles Dose modification: 50 mg/m2 (original dose 75 mg/m2, Cycle 5, Reason: Dose not tolerated), 50 mg/m2 (original dose 75 mg/m2, Cycle 4, Reason: Dose not tolerated) Administration: 150 mg (09/02/2019), 150 mg (09/23/2019), 100 mg (11/25/2019), 100 mg (12/16/2019), 150 mg (10/14/2019), 100 mg (11/04/2019) fosaprepitant  (EMEND) 150 mg in sodium chloride  0.9 % 145 mL IVPB, 150 mg, Intravenous,  Once, 6 of 6 cycles Administration: 150 mg (09/02/2019), 150 mg (09/23/2019), 150 mg (11/25/2019), 150 mg (12/16/2019), 150 mg (10/14/2019), 150 mg (11/04/2019) pertuzumab  (PERJETA ) 420 mg in sodium chloride  0.9 % 250 mL chemo infusion, 420 mg (100 % of original dose 420 mg), Intravenous, Once, 5 of 5 cycles Dose modification: 420 mg (original dose 420 mg, Cycle 1, Reason: Provider Judgment) Administration: 420 mg (09/02/2019), 420 mg (09/23/2019), 420 mg (11/25/2019), 420 mg (10/14/2019), 420 mg (11/04/2019) trastuzumab -dkst (OGIVRI ) 672 mg in sodium chloride  0.9 % 250 mL chemo infusion, 8 mg/kg = 672  mg, Intravenous,  Once, 6 of 6 cycles Administration: 672 mg (09/02/2019), 504 mg (09/23/2019), 504 mg (11/25/2019), 504 mg (12/16/2019), 504 mg (10/14/2019), 504 mg (11/04/2019)  for chemotherapy treatment.     01/06/2020 - 08/24/2020 Chemotherapy         01/14/2020 Surgery   Right lumpectomy Delane Fear): no evidence of residual carcinoma, 4 right axillary lymph nodes negative for carcinoma.   02/16/2020 - 03/30/2020 Radiation Therapy   Adjuvant radiation     CHIEF COMPLIANT: Surveillance of breast cancer  HISTORY OF PRESENT ILLNESS:   History of Present Illness Stephanie Brewer is a 58 year old female who presents with frequent headaches.  She experiences frequent headaches since June 15, 2023, with 47 episodes varying in intensity. Severe headaches are accompanied by nausea. She avoids medication for mild headaches to prevent excessive acetaminophen  use.  She takes aspirin and amlodipine. She is on a weekly vitamin D  supplement due to low levels identified in March 2025. She has an upcoming follow-up colonoscopy due to blood thinner use.     ALLERGIES:  is allergic to hyoscyamine, fosamax  [alendronate ], nexium [esomeprazole], and doxycycline.  MEDICATIONS:  Current Outpatient Medications  Medication Sig Dispense Refill   anastrozole  (ARIMIDEX ) 1 MG tablet Take 1 tablet (1 mg total) by mouth at bedtime. 90 tablet 3   Vitamin D , Ergocalciferol , (DRISDOL ) 1.25 MG (50000 UNIT) CAPS capsule Take 1 capsule (50,000 Units total) by mouth every 7 (seven) days. 12 capsule 3   XARELTO  20 MG TABS tablet TAKE ONE TABLET ONCE DAILY WITH SUPPER 30 tablet 12   zoledronic  acid (ZOMETA ) 4 MG/5ML injection Inject 4 mg into the vein once. W/ hematology/oncology     meclizine  (ANTIVERT ) 12.5 MG tablet Take 1 tablet (12.5 mg total) by mouth 3 (three) times daily as needed for dizziness. (Patient not taking: Reported on 08/27/2023) 30 tablet 0   minocycline (MINOCIN) 100 MG capsule Take 100 mg by mouth daily. (Patient not taking: Reported on 08/27/2023)     No current facility-administered medications for this visit.    PHYSICAL EXAMINATION: ECOG PERFORMANCE STATUS: 1 - Symptomatic but completely  ambulatory  Vitals:   08/27/23 0957  BP: 132/70  Pulse: 64  Resp: 18  Temp: (!) 97.3 F (36.3 C)  SpO2: 100%   Filed Weights   08/27/23 0957  Weight: 133 lb 1.6 oz (60.4 kg)      LABORATORY DATA:  I have reviewed the data as listed    Latest Ref Rng & Units 08/27/2023    9:29 AM 06/25/2023    8:02 AM 02/25/2023   12:40 PM  CMP  Glucose 70 - 99 mg/dL 95  78  98   BUN 6 - 20 mg/dL 10  11  10    Creatinine 0.44 - 1.00 mg/dL 1.61  0.96  0.45   Sodium 135 - 145 mmol/L 141  141  139   Potassium 3.5 - 5.1 mmol/L 4.2  3.7  3.2   Chloride 98 - 111 mmol/L 107  103  103   CO2 22 - 32 mmol/L 26  23  28    Calcium 8.9 - 10.3 mg/dL 9.1  9.2  8.6   Total Protein 6.0 - 8.5 g/dL  6.2  6.8   Total Bilirubin 0.0 - 1.2 mg/dL  0.5  0.4   Alkaline Phos 44 - 121 IU/L  62  52   AST 0 - 40 IU/L  15  19   ALT 0 - 32 IU/L  9  10     Lab Results  Component Value Date   WBC 3.4 06/25/2023   HGB 13.2 06/25/2023   HCT 39.6 06/25/2023   MCV 91 06/25/2023   PLT 220 06/25/2023   NEUTROABS 2.5 02/25/2023    ASSESSMENT & PLAN:  Malignant neoplasm of upper-outer quadrant of right breast in female, estrogen receptor positive (HCC) 07/28/2019:Screening mammogram detected right breast mass 11:30 position subareolar 1.3 cm, indeterminate 5 mm mass: Biopsy fibroadenoma, right axillary lymph node present, biopsy of the lymph node and the mass revealed grade 2 IDC ER 10%, PR 0%, Ki-67 45%, HER-2 +3+ by IHC  T1 cN1 M0 stage IIa   Treatment plan: 1. Neoadjuvant chemotherapy with TCH Perjeta  6 cycles completed 12/16/19 (Perjeta  D/Ced due to diarrhea) followed by Herceptin  maintenance for 1 year 2. 01/14/2020: Right lumpectomy: Benign, no evidence of residual cancer, 0/4 lymph nodes negative, additional margins also negative, complete pathologic response (pathologic complete response) completed Herceptin  08/24/2020 3. Followed by adjuvant radiation therapy completed 03/30/2020 4.  Antiestrogen therapy with  anastrozole  1 mg daily x5 years starting 07/13/2020 5.  Neratinib  September 2022-September 2023   Breast MRI: 3.8 cm tumor and 1 axillary lymph node.  ---------------------------------------------------------------------------------------------------------------------------------------------------- Treatment plan: Anastrozole  1 mg daily started 07/13/2020    Trigger finger surgery x 2: Successful    Anastrozole  toxicities: Denies any adverse effects to anastrozole  therapy. Breast cancer surveillance: Mammogram 08/22/2023: Benign breast density category B 2.  Breast exam 08/27/2023: Benign   Hospitalization: 08/17/2022-08/20/2022: Recurrent sigmoid diverticulitis: Robotic low anterior rectosigmoid resection Osteoporosis: Previously on Fosamax .  Switch her to Zometa  (could not tolerate Prolia ) Recurrent headaches: Will obtain a brain MRI stat and I will call her with results tomorrow. Will order guardant reveal for MRD monitoring  Recheck bone density test Return to clinic in 1 year for follow-up ------------------------------------- Assessment and Plan Assessment & Plan Malignant neoplasm of upper-outer quadrant of right breast Transitioned to screening mammograms due to well-managed findings. Breast density B, favorable for screening. Gardant blood test recommended for early detection of breast cancer DNA. - Initiate Gardant blood test biannually to detect breast cancer DNA. - Communicate Gardant test results to her. - No routine PET scans unless Gardant test is positive.  Headaches, possibly migraines 47 headache episodes since March 15, varying severity, some with nausea. Differential includes migraines, possibly hormone therapy-related. MRI warranted to rule out other causes, including HER2 positive metastasis. - Order stat MRI of the brain to expedite insurance approval. - Communicate MRI results to her the following day.  Recurrent sigmoid diverticulitis Experiences bloating and  infrequent bowel movements despite daily fiber intake. Upcoming evaluation with Dr. Venice Gillis for potential colonoscopy. - Attend appointment with Dr. Venice Gillis on June 11 for evaluation and potential colonoscopy.  Osteoporosis Due for bone density scan in July 2025. Previous scan in July 2023. Receiving Zemaira infusions for management. - Order bone density scan in July 2025 at [facility] location. - Administer Zometa  infusion today.      No orders of the defined types were placed in this encounter.  The patient has a good understanding of the overall plan. she agrees with it. she will call with any problems that may develop before the next visit here. Total time spent: 30 mins including face to face time and time spent for planning, charting and co-ordination of care   Viinay K Syble Picco, MD 08/27/23

## 2023-08-27 NOTE — Patient Instructions (Signed)

## 2023-08-27 NOTE — Assessment & Plan Note (Signed)
 07/28/2019:Screening mammogram detected right breast mass 11:30 position subareolar 1.3 cm, indeterminate 5 mm mass: Biopsy fibroadenoma, right axillary lymph node present, biopsy of the lymph node and the mass revealed grade 2 IDC ER 10%, PR 0%, Ki-67 45%, HER-2 +3+ by IHC  T1 cN1 M0 stage IIa   Treatment plan: 1. Neoadjuvant chemotherapy with TCH Perjeta  6 cycles completed 12/16/19 (Perjeta  D/Ced due to diarrhea) followed by Herceptin  maintenance for 1 year 2. 01/14/2020: Right lumpectomy: Benign, no evidence of residual cancer, 0/4 lymph nodes negative, additional margins also negative, complete pathologic response (pathologic complete response) completed Herceptin  08/24/2020 3. Followed by adjuvant radiation therapy completed 03/30/2020 4.  Antiestrogen therapy with anastrozole  1 mg daily x5 years starting 07/13/2020 5.  Neratinib  September 2022-September 2023   Breast MRI: 3.8 cm tumor and 1 axillary lymph node.  ---------------------------------------------------------------------------------------------------------------------------------------------------- Treatment plan: Anastrozole  1 mg daily started 07/13/2020, neratinib  started September 2022    Echocardiogram 05/31/20: EF 65-70 % Trigger finger surgery: Successful    Anastrozole  toxicities: Denies any adverse effects to anastrozole  therapy. Breast cancer surveillance: Mammogram 08/22/2023: Benign breast density category B 2.  Breast exam 08/27/2023: Benign   Hospitalization: 08/17/2022-08/20/2022: Recurrent sigmoid diverticulitis: Robotic low anterior rectosigmoid resection Osteoporosis: In RN to Fosamax .  I will set her up for Prolia  injections starting in 2 weeks.   Return to clinic in 1 year for follow-up

## 2023-08-28 ENCOUNTER — Inpatient Hospital Stay: Admitting: Hematology and Oncology

## 2023-08-28 ENCOUNTER — Ambulatory Visit (HOSPITAL_COMMUNITY)
Admission: RE | Admit: 2023-08-28 | Discharge: 2023-08-28 | Disposition: A | Discharge status: Home / Self Care | Source: Ambulatory Visit | Attending: Hematology and Oncology | Admitting: Hematology and Oncology

## 2023-08-28 DIAGNOSIS — C50919 Malignant neoplasm of unspecified site of unspecified female breast: Secondary | ICD-10-CM | POA: Diagnosis not present

## 2023-08-28 DIAGNOSIS — Z1721 Progesterone receptor positive status: Secondary | ICD-10-CM | POA: Diagnosis not present

## 2023-08-28 DIAGNOSIS — C50411 Malignant neoplasm of upper-outer quadrant of right female breast: Secondary | ICD-10-CM | POA: Insufficient documentation

## 2023-08-28 DIAGNOSIS — Z17411 Hormone receptor positive with human epidermal growth factor receptor 2 negative status: Secondary | ICD-10-CM | POA: Diagnosis not present

## 2023-08-28 DIAGNOSIS — Z170 Estrogen receptor positive status [ER+]: Secondary | ICD-10-CM

## 2023-08-28 DIAGNOSIS — R519 Headache, unspecified: Secondary | ICD-10-CM | POA: Diagnosis not present

## 2023-08-28 DIAGNOSIS — Z1732 Human epidermal growth factor receptor 2 negative status: Secondary | ICD-10-CM

## 2023-08-28 MED ORDER — GADOBUTROL 1 MMOL/ML IV SOLN
6.0000 mL | Freq: Once | INTRAVENOUS | Status: AC | PRN
  Administered 2023-08-28: 6 mL via INTRAVENOUS

## 2023-08-28 NOTE — Assessment & Plan Note (Signed)
 07/28/2019:Screening mammogram detected right breast mass 11:30 position subareolar 1.3 cm, indeterminate 5 mm mass: Biopsy fibroadenoma, right axillary lymph node present, biopsy of the lymph node and the mass revealed grade 2 IDC ER 10%, PR 0%, Ki-67 45%, HER-2 +3+ by IHC  T1 cN1 M0 stage IIa   Treatment plan: 1. Neoadjuvant chemotherapy with [facility] Perjeta  6 cycles completed 12/16/19 (Perjeta  D/Ced due to diarrhea) followed by Herceptin  maintenance for 1 year 2. 01/14/2020: Right lumpectomy: Benign, no evidence of residual cancer, 0/4 lymph nodes negative, additional margins also negative, complete pathologic response (pathologic complete response) completed Herceptin  08/24/2020 3. Followed by adjuvant radiation therapy completed 03/30/2020 4.  Antiestrogen therapy with anastrozole  1 mg daily x5 years starting 07/13/2020 5.  Neratinib  September 2022-September 2023   Breast MRI: 3.8 cm tumor and 1 axillary lymph node.  ---------------------------------------------------------------------------------------------------------------------------------------------------- Treatment plan: Anastrozole  1 mg daily started 07/13/2020    Trigger finger surgery x 2: Successful    Anastrozole  toxicities: Denies any adverse effects to anastrozole  therapy. Breast cancer surveillance: Mammogram 08/22/2023: Benign breast density category B 2.  Breast exam 08/27/2023: Benign   Hospitalization: 08/17/2022-08/20/2022: Recurrent sigmoid diverticulitis: Robotic low anterior rectosigmoid resection Osteoporosis: Previously on Fosamax .  Switch her to Zometa  (could not tolerate Prolia ) Recurrent headaches: Brain MRI 08/20/2023:  Will order guardant reveal for MRD monitoring   Recheck bone density test Return to clinic in 1 year for follow-up

## 2023-08-28 NOTE — Progress Notes (Signed)
 HEMATOLOGY-ONCOLOGY TELEPHONE VISIT PROGRESS NOTE  I connected with our patient on 08/28/23 at  3:15 PM EDT by telephone and verified that I am speaking with the correct person using two identifiers.  I discussed the limitations, risks, security and privacy concerns of performing an evaluation and management service by telephone and the availability of in person appointments.  I also discussed with the patient that there may be a patient responsible charge related to this service. The patient expressed understanding and agreed to proceed.   History of Present Illness: Telephone follow-up to discuss results of brain MRI  History of Present Illness She is a 58 year old female who presents with persistent migraines and for follow-up regarding recent imaging results.  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  Otherwise no evidence of metastatic disease.  She experiences persistent migraines without nausea, vomiting, or visual disturbances. She is concerned about the frequency and intensity of these headaches.    Oncology History  Malignant neoplasm of upper-outer quadrant of right breast in female, estrogen receptor positive (HCC)  07/28/2019 Initial Diagnosis   Screening mammogram detected right breast mass 11:30 position subareolar 1.3 cm, indeterminate 5 mm mass: Biopsy fibroadenoma, right axillary lymph node present, biopsy of the lymph node and the mass revealed grade 2 IDC ER 10%, PR 0%, Ki-67 45%, HER-2 +3+ by [facility]   08/20/2019 Cancer Staging   Staging form: Breast, AJCC 8th Edition - Clinical stage from 08/20/2019: Stage IIA (cT2, cN1, cM0, G2, ER+, PR-, HER2+) - Signed by Colie Dawes, MD on 01/26/2020   09/02/2019 - 12/18/2019 Chemotherapy   The patient had dexamethasone  (DECADRON ) 4 MG tablet, 4 mg (100 % of original dose 4 mg), Oral, Daily, 1 of 1 cycle, Start date: 08/20/2019, End date: 01/06/2020 Dose modification: 4 mg  (original dose 4 mg, Cycle 0) palonosetron  (ALOXI ) injection 0.25 mg, 0.25 mg, Intravenous,  Once, 6 of 6 cycles Administration: 0.25 mg (09/02/2019), 0.25 mg (09/23/2019), 0.25 mg (11/25/2019), 0.25 mg (12/16/2019), 0.25 mg (10/14/2019), 0.25 mg (11/04/2019) pegfilgrastim -jmdb (FULPHILA ) injection 6 mg, 6 mg, Subcutaneous,  Once, 6 of 6 cycles Administration: 6 mg (09/04/2019), 6 mg (09/25/2019), 6 mg (11/27/2019), 6 mg (12/18/2019), 6 mg (10/16/2019), 6 mg (11/06/2019) CARBOplatin  (PARAPLATIN ) 700 mg in sodium chloride  0.9 % 250 mL chemo infusion, 700 mg (100 % of original dose 700 mg), Intravenous,  Once, 6 of 6 cycles Dose modification: 700 mg (original dose 700 mg, Cycle 1), 700 mg (original dose 700 mg, Cycle 5), 600 mg (original dose 700 mg, Cycle 5, Reason: Other (see comments), Comment: dose reduce to 600 mg for CINV per Dr. Lee Public) Administration: 700 mg (09/02/2019), 700 mg (09/23/2019), 600 mg (11/25/2019), 600 mg (12/16/2019), 700 mg (10/14/2019), 600 mg (11/04/2019) DOCEtaxel  (TAXOTERE ) 150 mg in sodium chloride  0.9 % 250 mL chemo infusion, 75 mg/m2 = 150 mg, Intravenous,  Once, 6 of 6 cycles Dose modification: 50 mg/m2 (original dose 75 mg/m2, Cycle 5, Reason: Dose not tolerated), 50 mg/m2 (original dose 75 mg/m2, Cycle 4, Reason: Dose not tolerated) Administration: 150 mg (09/02/2019), 150 mg (09/23/2019), 100 mg (11/25/2019), 100 mg (12/16/2019), 150 mg (10/14/2019), 100 mg (11/04/2019) fosaprepitant  (EMEND) 150 mg in sodium chloride  0.9 % 145 mL IVPB, 150 mg, Intravenous,  Once, 6 of 6 cycles Administration: 150 mg (09/02/2019), 150 mg (09/23/2019), 150 mg (11/25/2019), 150 mg (12/16/2019), 150 mg (10/14/2019), 150 mg (11/04/2019) pertuzumab  (PERJETA ) 420 mg in sodium chloride  0.9 % 250 mL chemo infusion, 420  mg (100 % of original dose 420 mg), Intravenous, Once, 5 of 5 cycles Dose modification: 420 mg (original dose 420 mg, Cycle 1, Reason: Provider Judgment) Administration: 420 mg (09/02/2019), 420 mg (09/23/2019), 420 mg  (11/25/2019), 420 mg (10/14/2019), 420 mg (11/04/2019) trastuzumab -dkst (OGIVRI ) 672 mg in sodium chloride  0.9 % 250 mL chemo infusion, 8 mg/kg = 672 mg, Intravenous,  Once, 6 of 6 cycles Administration: 672 mg (09/02/2019), 504 mg (09/23/2019), 504 mg (11/25/2019), 504 mg (12/16/2019), 504 mg (10/14/2019), 504 mg (11/04/2019)  for chemotherapy treatment.    01/06/2020 - 08/24/2020 Chemotherapy         01/14/2020 Surgery   Right lumpectomy Delane Fear): no evidence of residual carcinoma, 4 right axillary lymph nodes negative for carcinoma.   02/16/2020 - 03/30/2020 Radiation Therapy   Adjuvant radiation     REVIEW OF SYSTEMS:   Constitutional: Denies fevers, chills or abnormal weight loss All other systems were reviewed with the patient and are negative. Observations/Objective:     Assessment Plan:  Malignant neoplasm of upper-outer quadrant of right breast in female, estrogen receptor positive (HCC) 07/28/2019:Screening mammogram detected right breast mass 11:30 position subareolar 1.3 cm, indeterminate 5 mm mass: Biopsy fibroadenoma, right axillary lymph node present, biopsy of the lymph node and the mass revealed grade 2 IDC ER 10%, PR 0%, Ki-67 45%, HER-2 +3+ by IHC  T1 cN1 M0 stage IIa   Treatment plan: 1. Neoadjuvant chemotherapy with TCH Perjeta  6 cycles completed 12/16/19 (Perjeta  D/Ced due to diarrhea) followed by Herceptin  maintenance for 1 year 2. 01/14/2020: Right lumpectomy: Benign, no evidence of residual cancer, 0/4 lymph nodes negative, additional margins also negative, complete pathologic response (pathologic complete response) completed Herceptin  08/24/2020 3. Followed by adjuvant radiation therapy completed 03/30/2020 4.  Antiestrogen therapy with anastrozole  1 mg daily x5 years starting 07/13/2020 5.  Neratinib  September 2022-September 2023   Breast MRI: 3.8 cm tumor and 1 axillary lymph node.   ---------------------------------------------------------------------------------------------------------------------------------------------------- Treatment plan: Anastrozole  1 mg daily started 07/13/2020    Trigger finger surgery x 2: Successful    Anastrozole  toxicities: Denies any adverse effects to anastrozole  therapy. Breast cancer surveillance: Mammogram 08/22/2023: Benign breast density category B 2.  Breast exam 08/27/2023: Benign   Hospitalization: 08/17/2022-08/20/2022: Recurrent sigmoid diverticulitis: Robotic low anterior rectosigmoid resection Osteoporosis: Previously on Fosamax .  Switch her to Zometa  (could not tolerate Prolia ) Recurrent headaches: Brain MRI 08/20/2023: No evidence of metastatic disease.  Possibly migraines.  She will discuss with her primary care physician.  Will order guardant reveal for MRD monitoring   Recheck bone density test Return to clinic in 1 year for follow-up  I discussed the assessment and treatment plan with the patient. The patient was provided an opportunity to ask questions and all were answered. The patient agreed with the plan and demonstrated an understanding of the instructions. The patient was advised to call back or seek an in-person evaluation if the symptoms worsen or if the condition fails to improve as anticipated.   I provided 10 minutes of non-face-to-face time during this encounter.  This includes time for charting and coordination of care   Stephanie Sheerer, MD

## 2023-08-30 ENCOUNTER — Telehealth: Payer: Self-pay

## 2023-08-30 NOTE — Telephone Encounter (Signed)
 Per md orders entered for Guardant Reveal and all supported documents faxed to [phone]. Faxed confirmation was received.

## 2023-09-04 ENCOUNTER — Ambulatory Visit: Admitting: Family Medicine

## 2023-09-04 ENCOUNTER — Encounter: Payer: Self-pay | Admitting: Family Medicine

## 2023-09-04 VITALS — BP 123/78 | HR 71 | Temp 97.7°F | Ht 64.0 in | Wt 132.2 lb

## 2023-09-04 DIAGNOSIS — Z7901 Long term (current) use of anticoagulants: Secondary | ICD-10-CM

## 2023-09-04 DIAGNOSIS — R198 Other specified symptoms and signs involving the digestive system and abdomen: Secondary | ICD-10-CM | POA: Diagnosis not present

## 2023-09-04 DIAGNOSIS — C50911 Malignant neoplasm of unspecified site of right female breast: Secondary | ICD-10-CM | POA: Diagnosis not present

## 2023-09-04 DIAGNOSIS — C773 Secondary and unspecified malignant neoplasm of axilla and upper limb lymph nodes: Secondary | ICD-10-CM

## 2023-09-04 DIAGNOSIS — G43709 Chronic migraine without aura, not intractable, without status migrainosus: Secondary | ICD-10-CM

## 2023-09-04 MED ORDER — SUMATRIPTAN SUCCINATE 50 MG PO TABS
50.0000 mg | ORAL_TABLET | ORAL | 11 refills | Status: DC | PRN
Start: 2023-09-04 — End: 2023-12-27

## 2023-09-04 MED ORDER — AMITRIPTYLINE HCL 10 MG PO TABS: 10.0000 mg | ORAL_TABLET | Freq: Every day | ORAL | 3 refills | Status: DC

## 2023-09-04 NOTE — Progress Notes (Signed)
 Acute Office Visit  Subjective:     Patient ID: Stephanie Brewer, female    DOB: 07-10-1965, 58 y.o.   MRN: [id number]  Chief Complaint  Patient presents with   Migraine    Migraine  This is a new problem. The current episode started more than 1 month ago. The problem occurs intermittently (a few times a week). The problem has been gradually worsening. Pain location: unilateral, behind eyes or occipital. The quality of the pain is described as stabbing, pulsating and boring. Associated symptoms include blurred vision, nausea, tingling (across scalp with HA- intermittent), a visual change and vomiting. Pertinent negatives include no abnormal behavior, hearing loss, loss of balance, numbness, phonophobia, photophobia or weakness. The symptoms are aggravated by bright light and activity. She has tried acetaminophen  and darkened room for the symptoms. The treatment provided mild relief. Her past medical history is significant for cancer. There is no history of migraines in the family.   Oncology ordered brain MRI that was negative. She dose have concurrent dx of likely IBS.     Review of Systems  HENT:  Negative for hearing loss.   Eyes:  Positive for blurred vision. Negative for photophobia.  Gastrointestinal:  Positive for nausea and vomiting.  Neurological:  Positive for tingling (across scalp with HA- intermittent). Negative for weakness, numbness and loss of balance.        Objective:    BP 123/78   Pulse 71   Temp 97.7 F (36.5 C) (Temporal)   Ht 5\' 4"  (1.626 m)   Wt 132 lb 3.2 oz (60 kg)   SpO2 100%   BMI 22.69 kg/m    Physical Exam Vitals and nursing note reviewed.  Constitutional:      General: She is not in acute distress.    Appearance: She is not ill-appearing, toxic-appearing or diaphoretic.  Cardiovascular:     Rate and Rhythm: Normal rate and regular rhythm.     Heart sounds: Normal heart sounds. No murmur heard. Pulmonary:     Effort: Pulmonary effort is  normal. No respiratory distress.     Breath sounds: Normal breath sounds. No wheezing, rhonchi or rales.  Musculoskeletal:     Right lower leg: No edema.     Left lower leg: No edema.  Skin:    General: Skin is warm and dry.  Neurological:     General: No focal deficit present.     Mental Status: She is alert and oriented to person, place, and time.     Cranial Nerves: No cranial nerve deficit.     Motor: No weakness.     Coordination: Coordination normal.     Gait: Gait normal.  Psychiatric:        Mood and Affect: Mood normal.        Behavior: Behavior normal.     No results found for any visits on 09/04/23.      Assessment & Plan:   Stephanie Brewer was seen today for migraine.  Diagnoses and all orders for this visit:  Chronic migraine w/o aura w/o status migrainosus, not intractable -     amitriptyline (ELAVIL) 10 MG tablet; Take 1 tablet (10 mg total) by mouth at bedtime. -     SUMAtriptan (IMITREX) 50 MG tablet; Take 1 tablet (50 mg total) by mouth every 2 (two) hours as needed for migraine. May repeat in 2 hours if headache persists or recurs.  Alternating constipation and diarrhea -     amitriptyline (ELAVIL) 10  MG tablet; Take 1 tablet (10 mg total) by mouth at bedtime.  Breast cancer metastasized to axillary lymph node, right (HCC)  Chronic anticoagulation   Reviewed MRI brain report- normal. Reviewed PCP OV note from 06/25/23 regarding HAs. Will try amitriptyline for migraine prevention given concurrent IBS like symptoms. Imitrex prn for breakthrough migraines. Avoid NSAIDs d/t chronic anticoagulation.   Return in about 6 weeks (around 10/16/2023) for with PCP for migraine follow up.  Albertha Huger, FNP

## 2023-09-11 ENCOUNTER — Encounter: Payer: Self-pay | Admitting: Hematology and Oncology

## 2023-09-11 ENCOUNTER — Encounter: Payer: Self-pay | Admitting: Gastroenterology

## 2023-09-11 ENCOUNTER — Ambulatory Visit: Admitting: Gastroenterology

## 2023-09-11 ENCOUNTER — Telehealth: Payer: Self-pay

## 2023-09-11 VITALS — BP 102/60 | HR 75 | Ht 64.0 in | Wt 130.0 lb

## 2023-09-11 DIAGNOSIS — M65311 Trigger thumb, right thumb: Secondary | ICD-10-CM | POA: Diagnosis not present

## 2023-09-11 DIAGNOSIS — M65331 Trigger finger, right middle finger: Secondary | ICD-10-CM | POA: Diagnosis not present

## 2023-09-11 DIAGNOSIS — Z09 Encounter for follow-up examination after completed treatment for conditions other than malignant neoplasm: Secondary | ICD-10-CM

## 2023-09-11 DIAGNOSIS — Z8719 Personal history of other diseases of the digestive system: Secondary | ICD-10-CM

## 2023-09-11 DIAGNOSIS — Z1211 Encounter for screening for malignant neoplasm of colon: Secondary | ICD-10-CM

## 2023-09-11 MED ORDER — NA SULFATE-K SULFATE-MG SULF 17.5-3.13-1.6 GM/177ML PO SOLN: 1.0000 | Freq: Once | ORAL | 0 refills | Status: AC

## 2023-09-11 NOTE — Patient Instructions (Signed)
 _______________________________________________________  If your blood pressure at your visit was 140/90 or greater, please contact your primary care physician to follow up on this.  _______________________________________________________  If you are age 59 or older, your body mass index should be between 23-30. Your Body mass index is 22.31 kg/m. If this is out of the aforementioned range listed, please consider follow up with your Primary Care Provider.  If you are age 57 or younger, your body mass index should be between 19-25. Your Body mass index is 22.31 kg/m. If this is out of the aformentioned range listed, please consider follow up with your Primary Care Provider.   ________________________________________________________  The [facility] providers would like to encourage you to use MYCHART to communicate with providers for non-urgent requests or questions.  Due to long hold times on the telephone, sending your provider a message by [facility] may be a faster and more efficient way to get a response.  Please allow 48 business hours for a response.  Please remember that this is for non-urgent requests.  _______________________________________________________  We have sent the following medications to your pharmacy for you to pick up at your convenience: Suprep  You will be contacted by our office prior to your procedure for directions on holding your Xarelto .  If you do not hear from our office 2 week prior to your scheduled procedure, please call [phone] to discuss.   You have been scheduled for a colonoscopy. Please follow written instructions given to you at your visit today.   If you use inhalers (even only as needed), please bring them with you on the day of your procedure.  DO NOT TAKE 7 DAYS PRIOR TO TEST- Trulicity (dulaglutide) Ozempic, Wegovy  (semaglutide ) Mounjaro (tirzepatide) Bydureon Bcise (exanatide extended release)  DO NOT TAKE 1 DAY PRIOR TO YOUR  TEST Rybelsus (semaglutide ) Adlyxin (lixisenatide) Victoza (liraglutide) Byetta (exanatide) ___________________________________________________________________________  Thank you,  Dr. Lajuan Pila

## 2023-09-11 NOTE — Telephone Encounter (Signed)
 Dr Lee Public  This patient is having a colonoscopy on 10-17-23 with Dr Venice Gillis and we are asking if you are willing to clear this patient to have her procedure and to hold her Xarelto  2 days prior to the procedure. Thank you for your time. Please advise.

## 2023-09-11 NOTE — Progress Notes (Signed)
 Chief Complaint: For colonoscopy.  Referring Provider:  Eliodoro Guerin, DO      ASSESSMENT AND PLAN;   #1. CRC screening  #2. H/O diverticulitis s/p robotic sigmoid resection(LAR) 08/16/2012   Plan: -Colon after hematology clearance to hold Xarelto  48 hours before    Discussed risks & benefits of colonoscopy. Risks including rare perforation req laparotomy, bleeding after bx/polypectomy req blood transfusion, rarely missing neoplasms, risks of anesthesia/sedation, rare risk of damage to internal organs. Benefits outweigh the risks. Patient agrees to proceed. All the questions were answered. Pt consents to proceed.' HPI:    Stephanie Brewer is a 58 y.o. female  With H/O DVT on Xeralto, breast CA, migraines  History of Present Illness Stephanie Brewer is a 58 year old female with recurrent diverticulitis s/p LAR Aug 17, 2022, due to a tight sigmoid diverticular stricture (with partial colonic obstruction) which would not allow passage of scope.  Overall has done well since surgery.  Here for screening colonoscopy.  No nausea, vomiting, heartburn, regurgitation, odynophagia or dysphagia.  No significant diarrhea or constipation.  No melena or hematochezia. No unintentional weight loss. No abdominal pain.  Mild abdominal bloating.  2 episodes of bright red blood per rectum which has resolved.  No family history of colon cancer.   Past GI procedures: Colonoscopy 07/27/2022 - Preparation of the colon was fair. - Severe sigmoid diverticulosis with sigmoid stricture- likely causing partial colonic obstruction - Mild pancolonic diverticulosis in the remaining colon. - The examined portion of the ileum was normal. - The examination was otherwise normal on direct and retroflexion views. - No specimens collected.  Colonoscopy 03/2015: Small ulcers in TI.  Pancolonic diverticulosis.  EGD 12/2015: Nl  Bx from LAR: Diverticulosis with pericolonic fibrosis and chronic inflammation,  foreign body type giant cell reaction consistent with prior abscess/perforation.  Benign lymph nodes.  No malignancy.   Past Medical History:  Diagnosis Date   Anemia    Anxiety    Breast cancer (HCC)    right breast   Clotting disorder (HCC)    Displacement of breast implant 12/24/2017   Diverticulitis    Diverticulosis    DVT (deep venous thrombosis) (HCC)    left leg knee and ankle    GERD (gastroesophageal reflux disease)    History of kidney stones    Kidney stones    Migraines    Osteoporosis    Pneumonia    Pre-diabetes    Sigmoid diverticulitis 08/17/2022    Past Surgical History:  Procedure Laterality Date   BREAST BIOPSY Left 08/15/2021   BREAST ENHANCEMENT SURGERY     Augmentation 2003   BREAST IMPLANT REMOVAL Bilateral 01/14/2020   Procedure: REMOVAL BREAST IMPLANTS;  Surgeon: Barb Bonito, MD;  Location: [facility];  Service: Plastics;  Laterality: Bilateral;   BREAST LUMPECTOMY Right 01/2020   BREAST LUMPECTOMY WITH RADIOACTIVE SEED AND SENTINEL LYMPH NODE BIOPSY Right 01/14/2020   Procedure: Right breast seed localized lumpectomy with sentinel lymph node biopsy;  Surgeon: Enid Harry, MD;  Location: [facility];  Service: General;  Laterality: Right;   BUNIONECTOMY Bilateral 2005   Right foot in 2006   COLON RESECTION  08/2022   LIPOSUCTION WITH LIPOFILLING Bilateral 11/29/2020   Procedure: LIPOSUCTION WITH LIPOFILLING FROM ABDOMEN TO BILATERAL BREASTS;  Surgeon: Barb Bonito, MD;  Location: [facility];  Service: Plastics;  Laterality: Bilateral;   MASTOPEXY Left 11/29/2020   Procedure: LEFT MASTOPEXY;  Surgeon:  Barb Bonito, MD;  Location: [facility];  Service: Plastics;  Laterality: Left;   PORTACATH PLACEMENT N/A 09/01/2019   Procedure: INSERTION PORT-A-CATH WITH ULTRASOUND GUIDANCE;  Surgeon: Enid Harry, MD;  Location: [facility];  Service: General;   Laterality: N/A;   PROCTOSCOPY N/A 08/17/2022   Procedure: RIGID PROCTOSCOPY;  Surgeon: Candyce Champagne, MD;  Location: [facility];  Service: General;  Laterality: N/A;   TRIGGER FINGER RELEASE Left 05/09/2020   Procedure: RELEASE TRIGGER FINGER/A-1 PULLEY LEFT LONG;  Surgeon: Brunilda Capra, MD;  Location: [facility];  Service: Orthopedics;  Laterality: Left;   TRIGGER FINGER RELEASE Right 08/02/2023   Procedure: RIGHT THUMB AND RIGHT LONG FINGER A1 PULLEY TRIGGER RELEASE;  Surgeon: Brunilda Capra, MD;  Location: [facility];  Service: Orthopedics;  Laterality: Right;  RIGHT THUMB AND RIGHT LONG FINGER TRIGGER RELEASES   UMBILICAL HERNIA REPAIR  2002    Family History  Problem Relation Age of Onset   Hyperlipidemia Mother    Polymyalgia rheumatica Mother    Hypertension Father    Hyperlipidemia Father    Diabetes type I Brother    Ovarian cancer Maternal Aunt    Diabetes type II Maternal Grandfather    Allergies Daughter    Colon cancer Neg Hx    Esophageal cancer Neg Hx    Rectal cancer Neg Hx    Stomach cancer Neg Hx     Social History   Tobacco Use   Smoking status: Never   Smokeless tobacco: Never  Vaping Use   Vaping status: Never Used  Substance Use Topics   Alcohol use: Yes    Comment: 1 PER MONTH   Drug use: No    Current Outpatient Medications  Medication Sig Dispense Refill   anastrozole  (ARIMIDEX ) 1 MG tablet Take 1 tablet (1 mg total) by mouth at bedtime. 90 tablet 3   meclizine  (ANTIVERT ) 12.5 MG tablet Take 1 tablet (12.5 mg total) by mouth 3 (three) times daily as needed for dizziness. 30 tablet 0   minocycline (MINOCIN) 100 MG capsule Take 100 mg by mouth daily as needed.     SUMAtriptan  (IMITREX ) 50 MG tablet Take 1 tablet (50 mg total) by mouth every 2 (two) hours as needed for migraine. May repeat in 2 hours if headache persists or recurs. 10 tablet 11   Vitamin D , Ergocalciferol , (DRISDOL ) 1.25 MG (50000 UNIT) CAPS capsule Take 1  capsule (50,000 Units total) by mouth every 7 (seven) days. 12 capsule 3   XARELTO  20 MG TABS tablet TAKE ONE TABLET ONCE DAILY WITH SUPPER 30 tablet 12   zoledronic  acid (ZOMETA ) 4 MG/5ML injection Inject 4 mg into the vein once. W/ hematology/oncology, every 6 months     amitriptyline  (ELAVIL ) 10 MG tablet Take 1 tablet (10 mg total) by mouth at bedtime. (Patient not taking: Reported on 09/11/2023) 30 tablet 3   No current facility-administered medications for this visit.    Allergies  Allergen Reactions   Hyoscyamine Anaphylaxis   Fosamax  [Alendronate ] Nausea Only and Other (See Comments)    SEVERE NAUSEA   Nexium [Esomeprazole] Swelling and Other (See Comments)    Tongue became swollen- breathing not affected   Doxycycline Palpitations and Other (See Comments)    Racing heart    Review of Systems:  Constitutional: Denies fever, chills, diaphoresis, appetite change and fatigue.  HEENT: Denies photophobia, eye pain, redness, hearing loss, ear pain, congestion, sore throat, rhinorrhea, sneezing, mouth sores, neck pain,  neck stiffness and tinnitus.   Respiratory: Denies SOB, DOE, cough, chest tightness,  and wheezing.   Cardiovascular: Denies chest pain, palpitations and leg swelling.  Genitourinary: Denies dysuria, urgency, frequency, hematuria, flank pain and difficulty urinating.  Musculoskeletal: Denies myalgias, back pain, joint swelling, arthralgias and gait problem.  Skin: No rash.  Neurological: Denies dizziness, seizures, syncope, weakness, light-headedness, numbness and headaches.  Hematological: Denies adenopathy. Easy bruising, personal or family bleeding history  Psychiatric/Behavioral: No anxiety or depression     Physical Exam:    BP 102/60   Pulse 75   Ht 5' 4 (1.626 m)   Wt 130 lb (59 kg)   BMI 22.31 kg/m  Wt Readings from Last 3 Encounters:  09/11/23 130 lb (59 kg)  09/04/23 132 lb 3.2 oz (60 kg)  08/27/23 133 lb 1.6 oz (60.4 kg)   Constitutional:   Well-developed, in no acute distress. Psychiatric: Normal mood and affect. Behavior is normal. HEENT: Pupils normal.  Conjunctivae are normal. No scleral icterus. Neck supple.  Cardiovascular: Normal rate, regular rhythm. No edema Pulmonary/chest: Effort normal and breath sounds normal. No wheezing, rales or rhonchi. Abdominal: Soft, nondistended. Nontender. Bowel sounds active throughout. There are no masses palpable. No hepatomegaly. Rectal: Deferred Neurological: Alert and oriented to person place and time. Skin: Skin is warm and dry. No rashes noted.  Data Reviewed: I have personally reviewed following labs and imaging studies  CBC:    Latest Ref Rng & Units 06/25/2023    8:02 AM 02/25/2023   12:40 PM 11/09/2022    8:17 PM  CBC  WBC 3.4 - 10.8 x10E3/uL 3.4  3.7  5.1   Hemoglobin 11.1 - 15.9 g/dL 52.8  41.3  24.4   Hematocrit 34.0 - 46.6 % 39.6  39.5  39.2   Platelets 150 - 450 x10E3/uL 220  146  253     CMP:    Latest Ref Rng & Units 08/27/2023    9:29 AM 06/25/2023    8:02 AM 02/25/2023   12:40 PM  CMP  Glucose 70 - 99 mg/dL 95  78  98   BUN 6 - 20 mg/dL 10  11  10    Creatinine 0.44 - 1.00 mg/dL 0.10  2.72  5.36   Sodium 135 - 145 mmol/L 141  141  139   Potassium 3.5 - 5.1 mmol/L 4.2  3.7  3.2   Chloride 98 - 111 mmol/L 107  103  103   CO2 22 - 32 mmol/L 26  23  28    Calcium 8.9 - 10.3 mg/dL 9.1  9.2  8.6   Total Protein 6.0 - 8.5 g/dL  6.2  6.8   Total Bilirubin 0.0 - 1.2 mg/dL  0.5  0.4   Alkaline Phos 44 - 121 IU/L  62  52   AST 0 - 40 IU/L  15  19   ALT 0 - 32 IU/L  9  10     GFR:    Radiology Studies: MR Brain W Wo Contrast Result Date: 08/28/2023 CLINICAL DATA:  58 year old female with breast cancer.  Headache. EXAM: MRI HEAD WITHOUT AND WITH CONTRAST TECHNIQUE: Multiplanar, multiecho pulse sequences of the brain and surrounding structures were obtained without and with intravenous contrast. CONTRAST:  6mL GADAVIST  GADOBUTROL  1 MMOL/ML IV SOLN COMPARISON:   Brain MRI 06/06/2021 and earlier. FINDINGS: Brain: No abnormal enhancement identified.  No dural thickening. No restricted diffusion to suggest acute infarction. No midline shift, mass effect, evidence of mass lesion, ventriculomegaly,  extra-axial collection or acute intracranial hemorrhage. Cervicomedullary junction and pituitary are within normal limits. Martina Sledge and white matter signal appears stable and normal for age. No encephalomalacia or chronic cerebral blood products identified. 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 Following contrast the major dural venous sinuses are enhancing and appear to be patent. Skull and upper cervical spine: Visualized bone marrow signal is within normal limits. Negative visible cervical spine aside from some degeneration. Sinuses/Orbits: Stable, negative. Other: Mastoids are well aerated. Visible internal auditory structures appear normal. Negative visible scalp and face. IMPRESSION: Stable and normal for age MRI appearance of the Brain. No metastatic disease or acute intracranial abnormality. Electronically Signed   By: Marlise Simpers M.D.   On: 08/28/2023 09:36   MM 3D SCREENING MAMMOGRAM BILATERAL BREAST Result Date: 08/22/2023 CLINICAL DATA:  Screening. EXAM: DIGITAL SCREENING BILATERAL MAMMOGRAM WITH TOMOSYNTHESIS AND CAD TECHNIQUE: Bilateral screening digital craniocaudal and mediolateral oblique mammograms were obtained. Bilateral screening digital breast tomosynthesis was performed. The images were evaluated with computer-aided detection. COMPARISON:  Previous exam(s). ACR Breast Density Category b: There are scattered areas of fibroglandular density. FINDINGS: There are no findings suspicious for malignancy. IMPRESSION: No mammographic evidence of malignancy. A result letter of this screening mammogram will be mailed directly to the patient. RECOMMENDATION:  Screening mammogram in one year. (Code:SM-B-01Y) BI-RADS CATEGORY  1: Negative. Electronically Signed   By: Amanda Jungling M.D.   On: 08/22/2023 14:18      Magnus Schuller, MD 09/11/2023, 4:28 PM  Cc: Eliodoro Guerin, DO

## 2023-09-16 NOTE — Telephone Encounter (Signed)
 LVM for patient to call back. ?

## 2023-09-17 NOTE — Telephone Encounter (Signed)
 Patient advised.

## 2023-09-17 NOTE — Telephone Encounter (Signed)
 Mychart message. Unable to leave voicemail

## 2023-09-17 NOTE — Telephone Encounter (Signed)
 Patient read message  Last read by Bartolo Bors at 4:33PM on 09/17/2023.

## 2023-09-17 NOTE — Telephone Encounter (Signed)
 Thank you :)

## 2023-09-19 DIAGNOSIS — C50411 Malignant neoplasm of upper-outer quadrant of right female breast: Secondary | ICD-10-CM | POA: Diagnosis not present

## 2023-09-20 ENCOUNTER — Telehealth: Payer: Self-pay | Admitting: *Deleted

## 2023-09-20 NOTE — Telephone Encounter (Signed)
 Per MD request RN placed call to pt with recent Guardant Reveal results being negative.  Pt educated and verbalized understanding.  Pt requesting to be scheduled for q6 month Zometa  infusions.  Message sent to scheduling team.

## 2023-09-23 ENCOUNTER — Encounter: Payer: Self-pay | Admitting: Hematology and Oncology

## 2023-10-01 ENCOUNTER — Other Ambulatory Visit: Payer: Self-pay | Admitting: Hematology and Oncology

## 2023-10-10 ENCOUNTER — Encounter: Payer: Self-pay | Admitting: Gastroenterology

## 2023-10-15 ENCOUNTER — Other Ambulatory Visit (HOSPITAL_BASED_OUTPATIENT_CLINIC_OR_DEPARTMENT_OTHER)

## 2023-10-16 ENCOUNTER — Ambulatory Visit: Admitting: Family Medicine

## 2023-10-17 ENCOUNTER — Encounter: Payer: Self-pay | Admitting: Gastroenterology

## 2023-10-17 ENCOUNTER — Ambulatory Visit: Admitting: Gastroenterology

## 2023-10-17 VITALS — BP 134/72 | HR 64 | Temp 98.0°F | Resp 14 | Ht 64.0 in | Wt 130.0 lb

## 2023-10-17 DIAGNOSIS — K5730 Diverticulosis of large intestine without perforation or abscess without bleeding: Secondary | ICD-10-CM | POA: Diagnosis not present

## 2023-10-17 DIAGNOSIS — K648 Other hemorrhoids: Secondary | ICD-10-CM

## 2023-10-17 DIAGNOSIS — K635 Polyp of colon: Secondary | ICD-10-CM

## 2023-10-17 DIAGNOSIS — K644 Residual hemorrhoidal skin tags: Secondary | ICD-10-CM

## 2023-10-17 DIAGNOSIS — Z1211 Encounter for screening for malignant neoplasm of colon: Secondary | ICD-10-CM | POA: Diagnosis not present

## 2023-10-17 DIAGNOSIS — D123 Benign neoplasm of transverse colon: Secondary | ICD-10-CM

## 2023-10-17 MED ORDER — SODIUM CHLORIDE 0.9 % IV SOLN: 500.0000 mL | Freq: Once | INTRAVENOUS | Status: DC

## 2023-10-17 MED ORDER — PROCTOFOAM HC 1-1 % EX FOAM: 1.0000 | Freq: Two times a day (BID) | CUTANEOUS | 2 refills | Status: DC

## 2023-10-17 NOTE — Progress Notes (Signed)
 Called to room to assist during endoscopic procedure.  Patient ID and intended procedure confirmed with present staff. Received instructions for my participation in the procedure from the performing physician.

## 2023-10-17 NOTE — Patient Instructions (Addendum)
 Resume Xarelto  on Saturday 7/19 Recommend metamucil 1 tablespoon daily in 8 ounces of fluid   YOU HAD AN ENDOSCOPIC PROCEDURE TODAY AT THE [facility]:   Refer to the procedure report that was given to you for any specific questions about what was found during the examination.  If the procedure report does not answer your questions, please call your gastroenterologist to clarify.  If you requested that your care partner not be given the details of your procedure findings, then the procedure report has been included in a sealed envelope for you to review at your convenience later.  YOU SHOULD EXPECT: Some feelings of bloating in the abdomen. Passage of more gas than usual.  Walking can help get rid of the air that was put into your GI tract during the procedure and reduce the bloating. If you had a lower endoscopy (such as a colonoscopy or flexible sigmoidoscopy) you may notice spotting of blood in your stool or on the toilet paper. If you underwent a bowel prep for your procedure, you may not have a normal bowel movement for a few days.  Please Note:  You might notice some irritation and congestion in your nose or some drainage.  This is from the oxygen used during your procedure.  There is no need for concern and it should clear up in a day or so.  SYMPTOMS TO REPORT IMMEDIATELY:  Following lower endoscopy (colonoscopy or flexible sigmoidoscopy):  Excessive amounts of blood in the stool  Significant tenderness or worsening of abdominal pains  Swelling of the abdomen that is new, acute  Fever of 100F or higher  For urgent or emergent issues, a gastroenterologist can be reached at any hour by calling [phone]. Do not use MyChart messaging for urgent concerns.    DIET:  We do recommend a small meal at first, but then you may proceed to your regular diet.  Drink plenty of fluids but you should avoid alcoholic beverages for 24 hours.  ACTIVITY:  You should plan to take it easy  for the rest of today and you should NOT DRIVE or use heavy machinery until tomorrow (because of the sedation medicines used during the test).    FOLLOW UP: Our staff will call the number listed on your records the next business day following your procedure.  We will call around 7:15- 8:00 am to check on you and address any questions or concerns that you may have regarding the information given to you following your procedure. If we do not reach you, we will leave a message.     If any biopsies were taken you will be contacted by phone or by letter within the next 1-3 weeks.  Please call us  at [phone] if you have not heard about the biopsies in 3 weeks.    SIGNATURES/CONFIDENTIALITY: You and/or your care partner have signed paperwork which will be entered into your electronic medical record.  These signatures attest to the fact that that the information above on your After Visit Summary has been reviewed and is understood.  Full responsibility of the confidentiality of this discharge information lies with you and/or your care-partner.

## 2023-10-17 NOTE — Op Note (Signed)
 [facility] Patient Name: Stephanie Brewer Procedure Date: 10/17/2023 2:42 PM MRN: [id number] Endoscopist: Lynnie Bring , MD, [id number] Age: 58 Referring MD:  Date of Birth: 22-Nov-1965 Gender: Female Account #: [id number] Procedure:                Colonoscopy Indications:              #1. CRC screening                           #2. H/O diverticulitis s/p robotic sigmoid                            resection(LAR) 07/2012 Medicines:                Monitored Anesthesia Care Procedure:                Pre-Anesthesia Assessment:                           - Prior to the procedure, a History and Physical                            was performed, and patient medications and                            allergies were reviewed. The patient's tolerance of                            previous anesthesia was also reviewed. The risks                            and benefits of the procedure and the sedation                            options and risks were discussed with the patient.                            All questions were answered, and informed consent                            was obtained. Prior Anticoagulants: The patient has                            taken Xarelto  (rivaroxaban ), last dose was 3 days                            prior to procedure. ASA Grade Assessment: II - A                            patient with mild systemic disease. After reviewing                            the risks and benefits, the patient was deemed in  satisfactory condition to undergo the procedure.                           After obtaining informed consent, the colonoscope                            was passed under direct vision. Throughout the                            procedure, the patient's blood pressure, pulse, and                            oxygen saturations were monitored continuously. The                            PCF-HQ190L Colonoscope [id number] was introduced                             through the anus and advanced to the the cecum,                            identified by appendiceal orifice and ileocecal                            valve. The colonoscopy was performed without                            difficulty. The patient tolerated the procedure                            well. The quality of the bowel preparation was                            good. The ileocecal valve, appendiceal orifice, and                            rectum were photographed. Scope In: 2:46:06 PM Scope Out: 2:58:02 PM Scope Withdrawal Time: 0 hours 9 minutes 28 seconds  Total Procedure Duration: 0 hours 11 minutes 56 seconds  Findings:                 A 6 mm polyp was found in the proximal transverse                            colon. The polyp was sessile. The polyp was removed                            with a cold snare. Resection and retrieval were                            complete.                           There was evidence of a prior end-to-end  colo-colonic anastomosis in the sigmoid colon, 12                            cm from the anal verge. This was patent and was                            characterized by healthy appearing mucosa. The                            anastomosis was traversed.                           A few medium-mouthed diverticula were found in the                            entire colon. Most prominently in neosigmoid. Few                            diverticula with stool impacted. No endoscopic                            evidence of diverticulitis.                           Non-bleeding external and internal hemorrhoids were                            found during retroflexion and during perianal exam.                            The hemorrhoids were moderate.                           Retroflexion in the right colon was performed.                           The exam was otherwise normal throughout the                             examined colon. Complications:            No immediate complications. Estimated Blood Loss:     Estimated blood loss: none. Impression:               - One 6 mm polyp in the proximal transverse colon,                            removed with a cold snare. Resected and retrieved.                           - Patent end-to-end colo-colonic anastomosis,                            characterized by healthy appearing mucosa.                           -  Diverticulosis in the entire examined colon.                           - Non-bleeding external and internal hemorrhoids. Recommendation:           - Patient has a contact number available for                            emergencies. The signs and symptoms of potential                            delayed complications were discussed with the                            patient. Return to normal activities tomorrow.                            Written discharge instructions were provided to the                            patient.                           - Resume previous diet.                           - Continue present medications.                           - Await pathology results.                           - Repeat colonoscopy for surveillance based on                            pathology results.                           - Resume Xarelto  (rivaroxaban ) at prior dose in 2                            days.                           - The findings and recommendations were discussed                            with the patient's family.                           - ProctoFoam -HC: Apply externally BID for 10 days.                            2RF                           - Use sugar-free Metamucil one tablespoon PO daily.  Avoid constipation. Lynnie Bring, MD 10/17/2023 3:07:00 PM This report has been signed electronically.

## 2023-10-17 NOTE — Progress Notes (Signed)
 Pt's states no medical or surgical changes since previsit or office visit.

## 2023-10-17 NOTE — Progress Notes (Signed)
 Chief Complaint: For colonoscopy.  Referring Provider:  Jolinda Norene HERO, DO      ASSESSMENT AND PLAN;   #1. CRC screening  #2. H/O diverticulitis s/p robotic sigmoid resection(LAR) 08/16/2012   Plan: -Colon after hematology clearance to hold Xarelto  48 hours before    Discussed risks & benefits of colonoscopy. Risks including rare perforation req laparotomy, bleeding after bx/polypectomy req blood transfusion, rarely missing neoplasms, risks of anesthesia/sedation, rare risk of damage to internal organs. Benefits outweigh the risks. Patient agrees to proceed. All the questions were answered. Pt consents to proceed.' HPI:    Stephanie Brewer is a 58 y.o. female  With H/O DVT on Xeralto, breast CA, migraines  History of Present Illness Stephanie Brewer is a 58 year old female with recurrent diverticulitis s/p LAR Aug 17, 2022, due to a tight sigmoid diverticular stricture (with partial colonic obstruction) which would not allow passage of scope.  Overall has done well since surgery.  Here for screening colonoscopy.  No nausea, vomiting, heartburn, regurgitation, odynophagia or dysphagia.  No significant diarrhea or constipation.  No melena or hematochezia. No unintentional weight loss. No abdominal pain.  Mild abdominal bloating.  2 episodes of bright red blood per rectum which has resolved.  No family history of colon cancer.   Past GI procedures: Colonoscopy 07/27/2022 - Preparation of the colon was fair. - Severe sigmoid diverticulosis with sigmoid stricture- likely causing partial colonic obstruction - Mild pancolonic diverticulosis in the remaining colon. - The examined portion of the ileum was normal. - The examination was otherwise normal on direct and retroflexion views. - No specimens collected.  Colonoscopy 03/2015: Small ulcers in TI.  Pancolonic diverticulosis.  EGD 12/2015: Nl  Bx from LAR: Diverticulosis with pericolonic fibrosis and chronic inflammation,  foreign body type giant cell reaction consistent with prior abscess/perforation.  Benign lymph nodes.  No malignancy.   Past Medical History:  Diagnosis Date   Anemia    Anxiety    Breast cancer (HCC)    right breast   Clotting disorder (HCC)    Displacement of breast implant 12/24/2017   Diverticulitis    Diverticulosis    DVT (deep venous thrombosis) (HCC)    left leg knee and ankle    GERD (gastroesophageal reflux disease)    History of kidney stones    Kidney stones    Migraines    Osteoporosis    Pneumonia    Pre-diabetes    Sigmoid diverticulitis 08/17/2022    Past Surgical History:  Procedure Laterality Date   BREAST BIOPSY Left 08/15/2021   BREAST ENHANCEMENT SURGERY     Augmentation 2003   BREAST IMPLANT REMOVAL Bilateral 01/14/2020   Procedure: REMOVAL BREAST IMPLANTS;  Surgeon: Elisabeth Craig RAMAN, MD;  Location: [facility];  Service: Plastics;  Laterality: Bilateral;   BREAST LUMPECTOMY Right 01/2020   BREAST LUMPECTOMY WITH RADIOACTIVE SEED AND SENTINEL LYMPH NODE BIOPSY Right 01/14/2020   Procedure: Right breast seed localized lumpectomy with sentinel lymph node biopsy;  Surgeon: Ebbie Cough, MD;  Location: [facility];  Service: General;  Laterality: Right;   BUNIONECTOMY Bilateral 2005   Right foot in 2006   COLON RESECTION  08/2022   LIPOSUCTION WITH LIPOFILLING Bilateral 11/29/2020   Procedure: LIPOSUCTION WITH LIPOFILLING FROM ABDOMEN TO BILATERAL BREASTS;  Surgeon: Elisabeth Craig RAMAN, MD;  Location: [facility];  Service: Plastics;  Laterality: Bilateral;   MASTOPEXY Left 11/29/2020   Procedure: LEFT MASTOPEXY;  Surgeon:  Elisabeth Craig RAMAN, MD;  Location: [facility];  Service: Plastics;  Laterality: Left;   PORTACATH PLACEMENT N/A 09/01/2019   Procedure: INSERTION PORT-A-CATH WITH ULTRASOUND GUIDANCE;  Surgeon: Ebbie Cough, MD;  Location: [facility];  Service: General;   Laterality: N/A;   PROCTOSCOPY N/A 08/17/2022   Procedure: RIGID PROCTOSCOPY;  Surgeon: Sheldon Standing, MD;  Location: [facility];  Service: General;  Laterality: N/A;   TRIGGER FINGER RELEASE Left 05/09/2020   Procedure: RELEASE TRIGGER FINGER/A-1 PULLEY LEFT LONG;  Surgeon: Murrell Drivers, MD;  Location: [facility];  Service: Orthopedics;  Laterality: Left;   TRIGGER FINGER RELEASE Right 08/02/2023   Procedure: RIGHT THUMB AND RIGHT LONG FINGER A1 PULLEY TRIGGER RELEASE;  Surgeon: Murrell Drivers, MD;  Location: [facility];  Service: Orthopedics;  Laterality: Right;  RIGHT THUMB AND RIGHT LONG FINGER TRIGGER RELEASES   UMBILICAL HERNIA REPAIR  2002    Family History  Problem Relation Age of Onset   Hyperlipidemia Mother    Polymyalgia rheumatica Mother    Colon polyps Father    Hypertension Father    Hyperlipidemia Father    Diabetes type I Brother    Ovarian cancer Maternal Aunt    Diabetes type II Maternal Grandfather    Allergies Daughter    Colon cancer Neg Hx    Esophageal cancer Neg Hx    Rectal cancer Neg Hx    Stomach cancer Neg Hx     Social History   Tobacco Use   Smoking status: Never   Smokeless tobacco: Never  Vaping Use   Vaping status: Never Used  Substance Use Topics   Alcohol use: Yes    Comment: 1 PER MONTH   Drug use: No    Current Outpatient Medications  Medication Sig Dispense Refill   anastrozole  (ARIMIDEX ) 1 MG tablet TAKE 1 TABLET AT BEDTIME 90 tablet 3   amitriptyline  (ELAVIL ) 10 MG tablet Take 1 tablet (10 mg total) by mouth at bedtime. (Patient not taking: Reported on 09/11/2023) 30 tablet 3   meclizine  (ANTIVERT ) 12.5 MG tablet Take 1 tablet (12.5 mg total) by mouth 3 (three) times daily as needed for dizziness. 30 tablet 0   minocycline (MINOCIN) 100 MG capsule Take 100 mg by mouth daily as needed.     SUMAtriptan  (IMITREX ) 50 MG tablet Take 1 tablet (50 mg total) by mouth every 2 (two) hours as needed for migraine. May  repeat in 2 hours if headache persists or recurs. 10 tablet 11   Vitamin D , Ergocalciferol , (DRISDOL ) 1.25 MG (50000 UNIT) CAPS capsule Take 1 capsule (50,000 Units total) by mouth every 7 (seven) days. 12 capsule 3   XARELTO  20 MG TABS tablet TAKE ONE TABLET ONCE DAILY WITH SUPPER 30 tablet 12   zoledronic  acid (ZOMETA ) 4 MG/5ML injection Inject 4 mg into the vein once. W/ hematology/oncology, every 6 months     Current Facility-Administered Medications  Medication Dose Route Frequency Provider Last Rate Last Admin   0.9 %  sodium chloride  infusion  500 mL Intravenous Once Charlanne Groom, MD        Allergies  Allergen Reactions   Hyoscyamine Anaphylaxis   Fosamax  [Alendronate ] Nausea Only and Other (See Comments)    SEVERE NAUSEA   Nexium [Esomeprazole] Swelling and Other (See Comments)    Tongue became swollen- breathing not affected   Doxycycline Palpitations and Other (See Comments)    Racing heart    Review of Systems:  Constitutional: Denies  fever, chills, diaphoresis, appetite change and fatigue.  HEENT: Denies photophobia, eye pain, redness, hearing loss, ear pain, congestion, sore throat, rhinorrhea, sneezing, mouth sores, neck pain, neck stiffness and tinnitus.   Respiratory: Denies SOB, DOE, cough, chest tightness,  and wheezing.   Cardiovascular: Denies chest pain, palpitations and leg swelling.  Genitourinary: Denies dysuria, urgency, frequency, hematuria, flank pain and difficulty urinating.  Musculoskeletal: Denies myalgias, back pain, joint swelling, arthralgias and gait problem.  Skin: No rash.  Neurological: Denies dizziness, seizures, syncope, weakness, light-headedness, numbness and headaches.  Hematological: Denies adenopathy. Easy bruising, personal or family bleeding history  Psychiatric/Behavioral: No anxiety or depression     Physical Exam:    BP 109/64   Pulse 73   Temp 98 F (36.7 C)   Ht 5' 4 (1.626 m)   Wt 130 lb (59 kg)   SpO2 100%   BMI 22.31  kg/m  Wt Readings from Last 3 Encounters:  10/17/23 130 lb (59 kg)  09/11/23 130 lb (59 kg)  09/04/23 132 lb 3.2 oz (60 kg)   Constitutional:  Well-developed, in no acute distress. Psychiatric: Normal mood and affect. Behavior is normal. HEENT: Pupils normal.  Conjunctivae are normal. No scleral icterus. Neck supple.  Cardiovascular: Normal rate, regular rhythm. No edema Pulmonary/chest: Effort normal and breath sounds normal. No wheezing, rales or rhonchi. Abdominal: Soft, nondistended. Nontender. Bowel sounds active throughout. There are no masses palpable. No hepatomegaly. Rectal: Deferred Neurological: Alert and oriented to person place and time. Skin: Skin is warm and dry. No rashes noted.  Data Reviewed: I have personally reviewed following labs and imaging studies  CBC:    Latest Ref Rng & Units 06/25/2023    8:02 AM 02/25/2023   12:40 PM 11/09/2022    8:17 PM  CBC  WBC 3.4 - 10.8 x10E3/uL 3.4  3.7  5.1   Hemoglobin 11.1 - 15.9 g/dL 86.7  86.8  86.7   Hematocrit 34.0 - 46.6 % 39.6  39.5  39.2   Platelets 150 - 450 x10E3/uL 220  146  253     CMP:    Latest Ref Rng & Units 08/27/2023    9:29 AM 06/25/2023    8:02 AM 02/25/2023   12:40 PM  CMP  Glucose 70 - 99 mg/dL 95  78  98   BUN 6 - 20 mg/dL 10  11  10    Creatinine 0.44 - 1.00 mg/dL 9.37  9.30  9.34   Sodium 135 - 145 mmol/L 141  141  139   Potassium 3.5 - 5.1 mmol/L 4.2  3.7  3.2   Chloride 98 - 111 mmol/L 107  103  103   CO2 22 - 32 mmol/L 26  23  28    Calcium 8.9 - 10.3 mg/dL 9.1  9.2  8.6   Total Protein 6.0 - 8.5 g/dL  6.2  6.8   Total Bilirubin 0.0 - 1.2 mg/dL  0.5  0.4   Alkaline Phos 44 - 121 IU/L  62  52   AST 0 - 40 IU/L  15  19   ALT 0 - 32 IU/L  9  10     GFR:    Radiology Studies: No results found.     Anselm Bring, MD 10/17/2023, 2:43 PM  Cc: Jolinda Norene HERO, DO

## 2023-10-17 NOTE — Progress Notes (Signed)
 Sedate, gd SR, tolerated procedure well, VSS, report to RN

## 2023-10-18 ENCOUNTER — Telehealth: Payer: Self-pay

## 2023-10-18 NOTE — Telephone Encounter (Signed)
  Follow up Call-     10/17/2023    1:54 PM 07/27/2022    9:12 AM  Call back number  Post procedure Call Back phone  # [phone]  Permission to leave phone message Yes Yes     Patient questions:  Do you have a fever, pain , or abdominal swelling? No. Pain Score  0 *  Have you tolerated food without any problems? Yes.  Have you been able to return to your normal activities? Yes.    Do you have any questions about your discharge instructions: Diet   No. Medications  No. Follow up visit  No.  Do you have questions or concerns about your Care? No.  Actions: * If pain score is 4 or above: No action needed, pain <4.

## 2023-10-22 ENCOUNTER — Ambulatory Visit: Payer: Self-pay | Admitting: Gastroenterology

## 2023-10-22 DIAGNOSIS — Z01419 Encounter for gynecological examination (general) (routine) without abnormal findings: Secondary | ICD-10-CM | POA: Diagnosis not present

## 2023-10-22 LAB — SURGICAL PATHOLOGY

## 2023-10-24 ENCOUNTER — Ambulatory Visit (HOSPITAL_BASED_OUTPATIENT_CLINIC_OR_DEPARTMENT_OTHER)
Admission: RE | Admit: 2023-10-24 | Discharge: 2023-10-24 | Disposition: A | Discharge status: Home / Self Care | Source: Ambulatory Visit | Attending: Hematology and Oncology | Admitting: Hematology and Oncology

## 2023-10-24 DIAGNOSIS — Z780 Asymptomatic menopausal state: Secondary | ICD-10-CM | POA: Insufficient documentation

## 2023-10-24 DIAGNOSIS — M810 Age-related osteoporosis without current pathological fracture: Secondary | ICD-10-CM | POA: Diagnosis not present

## 2023-11-06 ENCOUNTER — Ambulatory Visit: Admitting: Family Medicine

## 2023-11-15 DIAGNOSIS — H5213 Myopia, bilateral: Secondary | ICD-10-CM | POA: Diagnosis not present

## 2023-12-09 ENCOUNTER — Ambulatory Visit: Attending: General Surgery

## 2023-12-09 VITALS — Wt 133.5 lb

## 2023-12-09 DIAGNOSIS — Z483 Aftercare following surgery for neoplasm: Secondary | ICD-10-CM | POA: Insufficient documentation

## 2023-12-09 NOTE — Therapy (Signed)
 OUTPATIENT PHYSICAL THERAPY SOZO SCREENING NOTE   Patient Name: Stephanie Brewer MRN: [id number] DOB:06-18-1965, 58 y.o., female Today's Date: 12/09/2023  PCP: Jolinda Norene HERO, DO REFERRING PROVIDER: Ebbie Cough, MD   PT End of Session - 12/09/23 [phone]     Visit Number 7   # unchanged due to screen only   PT Start Time 0821    PT Stop Time 0825    PT Time Calculation (min) 4 min    Activity Tolerance Patient tolerated treatment well    Behavior During Therapy [facility] for tasks assessed/performed          Past Medical History:  Diagnosis Date   Anemia    Anxiety    Breast cancer (HCC)    right breast   Clotting disorder (HCC)    Displacement of breast implant 12/24/2017   Diverticulitis    Diverticulosis    DVT (deep venous thrombosis) (HCC)    left leg knee and ankle    GERD (gastroesophageal reflux disease)    History of kidney stones    Kidney stones    Migraines    Osteoporosis    Pneumonia    Pre-diabetes    Sigmoid diverticulitis 08/17/2022   Past Surgical History:  Procedure Laterality Date   BREAST BIOPSY Left 08/15/2021   BREAST ENHANCEMENT SURGERY     Augmentation 2003   BREAST IMPLANT REMOVAL Bilateral 01/14/2020   Procedure: REMOVAL BREAST IMPLANTS;  Surgeon: Elisabeth Craig RAMAN, MD;  Location: [facility];  Service: Plastics;  Laterality: Bilateral;   BREAST LUMPECTOMY Right 01/2020   BREAST LUMPECTOMY WITH RADIOACTIVE SEED AND SENTINEL LYMPH NODE BIOPSY Right 01/14/2020   Procedure: Right breast seed localized lumpectomy with sentinel lymph node biopsy;  Surgeon: Ebbie Cough, MD;  Location: [facility];  Service: General;  Laterality: Right;   BUNIONECTOMY Bilateral 2005   Right foot in 2006   COLON RESECTION  08/2022   LIPOSUCTION WITH LIPOFILLING Bilateral 11/29/2020   Procedure: LIPOSUCTION WITH LIPOFILLING FROM ABDOMEN TO BILATERAL BREASTS;  Surgeon: Elisabeth Craig RAMAN, MD;  Location: [facility];   Service: Plastics;  Laterality: Bilateral;   MASTOPEXY Left 11/29/2020   Procedure: LEFT MASTOPEXY;  Surgeon: Elisabeth Craig RAMAN, MD;  Location: [facility];  Service: Plastics;  Laterality: Left;   PORTACATH PLACEMENT N/A 09/01/2019   Procedure: INSERTION PORT-A-CATH WITH ULTRASOUND GUIDANCE;  Surgeon: Ebbie Cough, MD;  Location: [facility];  Service: General;  Laterality: N/A;   PROCTOSCOPY N/A 08/17/2022   Procedure: RIGID PROCTOSCOPY;  Surgeon: Sheldon Standing, MD;  Location: [facility];  Service: General;  Laterality: N/A;   TRIGGER FINGER RELEASE Left 05/09/2020   Procedure: RELEASE TRIGGER FINGER/A-1 PULLEY LEFT LONG;  Surgeon: Murrell Drivers, MD;  Location: [facility];  Service: Orthopedics;  Laterality: Left;   TRIGGER FINGER RELEASE Right 08/02/2023   Procedure: RIGHT THUMB AND RIGHT LONG FINGER A1 PULLEY TRIGGER RELEASE;  Surgeon: Murrell Drivers, MD;  [facility];  Service: Orthopedics;  Laterality: Right;  RIGHT THUMB AND RIGHT LONG FINGER TRIGGER RELEASES   UMBILICAL HERNIA REPAIR  2002   Patient Active Problem List   Diagnosis Date Noted   Chronic anticoagulation 07/16/2022   Normocytic anemia 05/26/2022   History of recurrent deep vein thrombosis (DVT) 05/26/2022   Pre-diabetes 05/23/2022   Pure hypercholesterolemia 05/23/2022   Osteoporosis of femur without pathological fracture 11/15/2021   Overweight (BMI 25.0-29.9) 08/15/2021   Port-A-Cath in place 09/09/2019  Malignant neoplasm of upper-outer quadrant of right breast in female, estrogen receptor positive (HCC) 08/20/2019   Breast cancer metastasized to axillary lymph node, right (HCC) 08/18/2019   Trigger middle finger of left hand 02/10/2019   Radicular leg pain 02/04/2018   H/O bilateral breast implants 12/24/2017   GERD (gastroesophageal reflux disease) 12/06/2015   Gluten intolerance 12/06/2015    REFERRING DIAG: right breast cancer at risk for  lymphedema  THERAPY DIAG: Aftercare following surgery for neoplasm  PERTINENT HISTORY: R breast cancer, grade 2 invasive ductal carcinoma, ER +, PR-, HER2+, beginning chemo next week 09/02/19, 01/15/20- R lumpectomy and SLNB (4 nodes all negative), will begin radiation 02/15/20,  hx of LLE DVT following foot surgery for bunion, acute DVT in LUE and in LLE diagnosed on 12/16/20   PRECAUTIONS: right UE Lymphedema risk, None  SUBJECTIVE: Pt returns for her 6 month L-Dex screen.   PAIN:  Are you having pain? No  SOZO SCREENING: Patient was assessed today using the SOZO machine to determine the lymphedema index score. This was compared to her baseline score. It was determined that she is within the recommended range when compared to her baseline and no further action is needed at this time. She will continue SOZO screenings. These are done every 3 months for 2 years post operatively followed by every 6 months for 2 years, and then annually.   L-DEX FLOWSHEETS - 12/09/23 0800       L-DEX LYMPHEDEMA SCREENING   Measurement Type Unilateral    L-DEX MEASUREMENT EXTREMITY Upper Extremity    POSITION  Standing    DOMINANT SIDE Left    At Risk Side Right    BASELINE SCORE (UNILATERAL) 1.4    L-DEX SCORE (UNILATERAL) 0.3    VALUE CHANGE (UNILAT) -1.1           Aden Berwyn Caldron, PTA 12/09/2023, 8:24 AM

## 2023-12-27 ENCOUNTER — Telehealth: Payer: Self-pay

## 2023-12-27 ENCOUNTER — Other Ambulatory Visit (HOSPITAL_COMMUNITY): Payer: Self-pay

## 2023-12-27 ENCOUNTER — Encounter: Payer: Self-pay | Admitting: Family Medicine

## 2023-12-27 ENCOUNTER — Ambulatory Visit (INDEPENDENT_AMBULATORY_CARE_PROVIDER_SITE_OTHER): Admitting: Family Medicine

## 2023-12-27 VITALS — BP 107/64 | HR 77 | Temp 97.7°F | Ht 64.0 in | Wt 135.4 lb

## 2023-12-27 DIAGNOSIS — Z789 Other specified health status: Secondary | ICD-10-CM

## 2023-12-27 DIAGNOSIS — Z86718 Personal history of other venous thrombosis and embolism: Secondary | ICD-10-CM

## 2023-12-27 DIAGNOSIS — G43709 Chronic migraine without aura, not intractable, without status migrainosus: Secondary | ICD-10-CM

## 2023-12-27 DIAGNOSIS — R600 Localized edema: Secondary | ICD-10-CM

## 2023-12-27 DIAGNOSIS — E663 Overweight: Secondary | ICD-10-CM

## 2023-12-27 DIAGNOSIS — R7303 Prediabetes: Secondary | ICD-10-CM | POA: Diagnosis not present

## 2023-12-27 LAB — BAYER DCA HB A1C WAIVED: HB A1C (BAYER DCA - WAIVED): 5.6 % (ref 4.8–5.6)

## 2023-12-27 MED ORDER — FUROSEMIDE 40 MG PO TABS: 40.0000 mg | ORAL_TABLET | Freq: Every day | ORAL | 3 refills | Status: AC | PRN

## 2023-12-27 MED ORDER — WEGOVY 0.5 MG/0.5ML ~~LOC~~ SOAJ: 0.5000 mg | SUBCUTANEOUS | 3 refills | Status: DC

## 2023-12-27 MED ORDER — WEGOVY 0.25 MG/0.5ML ~~LOC~~ SOAJ: 0.2500 mg | SUBCUTANEOUS | 0 refills | Status: DC

## 2023-12-27 MED ORDER — NURTEC 75 MG PO TBDP: 1.0000 | ORAL_TABLET | Freq: Every day | ORAL | Status: AC | PRN

## 2023-12-27 NOTE — Telephone Encounter (Signed)
 Pharmacy Patient Advocate Encounter   Received notification from CoverMyMeds that prior authorization for Wegovy  0.25MG /0.5ML auto-injectors is required/requested.   Insurance verification completed.   The patient is insured through [facility] .   Per test claim: PA required; PA submitted to above mentioned insurance via Latent Key/confirmation #/EOC [id number] Status is pending

## 2023-12-27 NOTE — Progress Notes (Signed)
 Subjective: CC:67m check up PCP: Jolinda Norene HERO, DO YEP:Stephanie Brewer is a 58 y.o. female presenting to clinic today for:  Patient reports that overall she has been doing okay.  She has been having some increasing swelling in her hands and felt like she was having tightness with making fist so she started on some old Lasix  20 mg that she had leftover.  She notes that this did improve the swelling.  She voices frustration because she has some weight gain over the last month where she has put on 5 pounds.  She was previously on Wegovy  prior to her having colon resection for recurrent diverticulitis.  She wants to go back on this because she is having insufficient success with proper diet and exercise regimen  Compliant with Xarelto .  No reports of any abnormal bleeding.  Does not need refills  Continues to have about 1 migraine per week.  Never started the amitriptyline  because she was reluctant due to its antidepressant state.  She uses the Imitrex  and sometimes it does relieve the migraine but sometimes she does have to take the second dose.   ROS: Per HPI  Allergies  Allergen Reactions   Hyoscyamine Anaphylaxis   Fosamax  [Alendronate ] Nausea Only and Other (See Comments)    SEVERE NAUSEA   Nexium [Esomeprazole] Swelling and Other (See Comments)    Tongue became swollen- breathing not affected   Doxycycline Palpitations and Other (See Comments)    Racing heart   Past Medical History:  Diagnosis Date   Anemia    Anxiety    Breast cancer (HCC)    right breast   Clotting disorder    Displacement of breast implant 12/24/2017   Diverticulitis    Diverticulosis    DVT (deep venous thrombosis) (HCC)    left leg knee and ankle    GERD (gastroesophageal reflux disease)    History of kidney stones    Kidney stones    Migraines    Osteoporosis    Pneumonia    Pre-diabetes    Sigmoid diverticulitis 08/17/2022    Current Outpatient Medications:    anastrozole  (ARIMIDEX ) 1  MG tablet, TAKE 1 TABLET AT BEDTIME, Disp: 90 tablet, Rfl: 3   meclizine  (ANTIVERT ) 12.5 MG tablet, Take 1 tablet (12.5 mg total) by mouth 3 (three) times daily as needed for dizziness., Disp: 30 tablet, Rfl: 0   minocycline (MINOCIN) 100 MG capsule, Take 100 mg by mouth daily as needed., Disp: , Rfl:    SUMAtriptan  (IMITREX ) 50 MG tablet, Take 1 tablet (50 mg total) by mouth every 2 (two) hours as needed for migraine. May repeat in 2 hours if headache persists or recurs., Disp: 10 tablet, Rfl: 11   Vitamin D , Ergocalciferol , (DRISDOL ) 1.25 MG (50000 UNIT) CAPS capsule, Take 1 capsule (50,000 Units total) by mouth every 7 (seven) days., Disp: 12 capsule, Rfl: 3   XARELTO  20 MG TABS tablet, TAKE ONE TABLET ONCE DAILY WITH SUPPER, Disp: 30 tablet, Rfl: 12   zoledronic  acid (ZOMETA ) 4 MG/5ML injection, Inject 4 mg into the vein once. W/ hematology/oncology, every 6 months, Disp: , Rfl:  Social History   Socioeconomic History   Marital status: Married    Spouse name: Tim   Number of children: 2   Years of education: some college   Highest education level: Some college, no degree  Occupational History   Occupation: [employer] banker, corporate  Tobacco Use   Smoking status: Never   Smokeless tobacco: Never  Vaping  Use   Vaping status: Never Used  Substance and Sexual Activity   Alcohol use: Yes    Comment: 1 PER MONTH   Drug use: No   Sexual activity: Yes  Other Topics Concern   Not on file  Social History Narrative   Lives with spouse   Caffeine use: no caffeine    Left handed    Works for an Chartered certified accountant to read fiction   Social Drivers of Corporate investment banker Strain: Low Risk  (12/23/2023)   Overall Financial Resource Strain (CARDIA)    Difficulty of Paying Living Expenses: Not hard at all  Food Insecurity: No Food Insecurity (12/23/2023)   Hunger Vital Sign    Worried About Running Out of Food in the Last Year: Never true    Ran Out of Food in the Last Year: Never true   Transportation Needs: No Transportation Needs (12/23/2023)   PRAPARE - Administrator, Civil Service (Medical): No    Lack of Transportation (Non-Medical): No  Physical Activity: Insufficiently Active (12/23/2023)   Exercise Vital Sign    Days of Exercise per Week: 2 days    Minutes of Exercise per Session: 30 min  Stress: No Stress Concern Present (12/23/2023)   [employer] of Occupational Health - Occupational Stress Questionnaire    Feeling of Stress: Only a little  Social Connections: Socially Integrated (12/23/2023)   Social Connection and Isolation Panel    Frequency of Communication with Friends and Family: More than three times a week    Frequency of Social Gatherings with Friends and Family: Once a week    Attends Religious Services: More than 4 times per year    Active Member of [employer] or Organizations: Yes    Attends Engineer, structural: More than 4 times per year    Marital Status: Married  Catering manager Violence: Not At Risk (06/25/2023)   Humiliation, Afraid, Rape, and Kick questionnaire    Fear of Current or Ex-Partner: No    Emotionally Abused: No    Physically Abused: No    Sexually Abused: No   Family History  Problem Relation Age of Onset   Hyperlipidemia Mother    Polymyalgia rheumatica Mother    Colon polyps Father    Hypertension Father    Hyperlipidemia Father    Diabetes type I Brother    Ovarian cancer Maternal Aunt    Diabetes type II Maternal Grandfather    Allergies Daughter    Colon cancer Neg Hx    Esophageal cancer Neg Hx    Rectal cancer Neg Hx    Stomach cancer Neg Hx     Objective: Office vital signs reviewed. BP 107/64   Pulse 77   Temp 97.7 F (36.5 C)   Ht 5' 4 (1.626 m)   Wt 135 lb 6 oz (61.4 kg)   SpO2 100%   BMI 23.24 kg/m   Physical Examination:  General: Awake, alert, nontoxic female, No acute distress HEENT: sclera white, MMM Cardio: regular rate and rhythm, S1S2 heard, no murmurs  appreciated Pulm: clear to auscultation bilaterally, no wheezes, rhonchi or rales; normal work of breathing on room air Extremities: No gross edema appreciated Neuro: No focal deficits identified  Assessment/ Plan: 58 y.o. female   Chronic migraine w/o aura w/o status migrainosus, not intractable - Plan: Rimegepant Sulfate (NURTEC) 75 MG TBDP  Pre-diabetes - Plan: Bayer DCA Hb A1c Waived, semaglutide -weight management (WEGOVY ) 0.25 MG/0.5ML SOAJ SQ  injection, semaglutide -weight management (WEGOVY ) 0.5 MG/0.5ML SOAJ SQ injection  Hepatitis B vaccination status unknown - Plan: Hepatitis B surface antibody,quantitative  Peripheral edema - Plan: furosemide  (LASIX ) 40 MG tablet  Overweight (BMI 25.0-29.9) - Plan: semaglutide -weight management (WEGOVY ) 0.25 MG/0.5ML SOAJ SQ injection, semaglutide -weight management (WEGOVY ) 0.5 MG/0.5ML SOAJ SQ injection  History of recurrent deep vein thrombosis (DVT) - Plan: semaglutide -weight management (WEGOVY ) 0.25 MG/0.5ML SOAJ SQ injection, semaglutide -weight management (WEGOVY ) 0.5 MG/0.5ML SOAJ SQ injection   Trial of Nurtec.  Discussed continue Imitrex  given cardiovascular risk and somewhat refractory symptoms to Imitrex   A1c within normal range today.  I have renewed Wegovy  given cardiovascular risk though BMI no longer in her initial overweight category.  We discussed potentially pulsed dosing this medication as I do not think that she needs any substantial weight loss but more so maintenance.  Peripheral edema not on exam but recently utilized Lasix  have given her this for as needed use.  Will do hepatitis B titer and potentially vaccinate pending those results   Norene CHRISTELLA Fielding, DO [facility] [phone]

## 2023-12-28 LAB — HEPATITIS B SURFACE ANTIBODY, QUANTITATIVE: Hepatitis B Surf Ab Quant: <3.5 m[IU]/mL — ABNORMAL LOW

## 2023-12-30 ENCOUNTER — Other Ambulatory Visit (HOSPITAL_COMMUNITY): Payer: Self-pay

## 2023-12-30 ENCOUNTER — Encounter: Payer: Self-pay | Admitting: Family Medicine

## 2023-12-30 ENCOUNTER — Ambulatory Visit: Payer: Self-pay | Admitting: Family Medicine

## 2023-12-30 NOTE — Telephone Encounter (Signed)
 Pharmacy Patient Advocate Encounter  Received notification from [facility] that Prior Authorization for Wegovy  0.25MG /0.5ML auto-injectors has been DENIED.  Full denial letter will be uploaded to the media tab. See denial reason below.   PA #/Case ID/Reference #: [id number]

## 2023-12-30 NOTE — Telephone Encounter (Signed)
 I called and spoke with patient and made her aware. She voiced understanding and will call the pharmacy to see how much it would cost OOP.

## 2023-12-30 NOTE — Telephone Encounter (Signed)
Please inform pt of denial

## 2024-01-02 ENCOUNTER — Ambulatory Visit

## 2024-01-03 ENCOUNTER — Other Ambulatory Visit (HOSPITAL_COMMUNITY): Payer: Self-pay

## 2024-01-07 ENCOUNTER — Ambulatory Visit: Admitting: *Deleted

## 2024-01-07 DIAGNOSIS — Z23 Encounter for immunization: Secondary | ICD-10-CM | POA: Diagnosis not present

## 2024-01-07 NOTE — Progress Notes (Signed)
 Patient is in office today for a nurse visit for Immunization. Patient Injection was given in the  Left deltoid. Patient tolerated injection well.

## 2024-01-10 ENCOUNTER — Ambulatory Visit: Admitting: Plastic Surgery

## 2024-01-10 ENCOUNTER — Encounter: Payer: Self-pay | Admitting: Plastic Surgery

## 2024-01-10 VITALS — BP 124/78 | HR 73 | Ht 64.0 in | Wt 133.0 lb

## 2024-01-10 DIAGNOSIS — D225 Melanocytic nevi of trunk: Secondary | ICD-10-CM | POA: Diagnosis not present

## 2024-01-10 DIAGNOSIS — C50911 Malignant neoplasm of unspecified site of right female breast: Secondary | ICD-10-CM

## 2024-01-10 DIAGNOSIS — C50411 Malignant neoplasm of upper-outer quadrant of right female breast: Secondary | ICD-10-CM

## 2024-01-10 DIAGNOSIS — N651 Disproportion of reconstructed breast: Secondary | ICD-10-CM | POA: Diagnosis not present

## 2024-01-10 DIAGNOSIS — Z9011 Acquired absence of right breast and nipple: Secondary | ICD-10-CM | POA: Diagnosis not present

## 2024-01-10 DIAGNOSIS — Z853 Personal history of malignant neoplasm of breast: Secondary | ICD-10-CM

## 2024-01-10 DIAGNOSIS — Z170 Estrogen receptor positive status [ER+]: Secondary | ICD-10-CM

## 2024-01-10 DIAGNOSIS — Z923 Personal history of irradiation: Secondary | ICD-10-CM

## 2024-01-10 DIAGNOSIS — D485 Neoplasm of uncertain behavior of skin: Secondary | ICD-10-CM | POA: Diagnosis not present

## 2024-01-10 DIAGNOSIS — Z1283 Encounter for screening for malignant neoplasm of skin: Secondary | ICD-10-CM | POA: Diagnosis not present

## 2024-01-10 NOTE — Progress Notes (Signed)
   Subjective:    Patient ID: Stephanie Brewer, female    DOB: 06-Sep-1965, 58 y.o.   MRN: [id number]  The patient is a 58 year old female here for discussion about her breast.  She was treated for breast cancer in 2021 that was involving the right breast.  She had partial right mastectomy with radiation.  She had previous implants placed and these were removed at the same time.  They were reported to be 450 cc.  She also had a left mastopexy in August 2021 for improved symmetry.  Past surgical history includes abdominoplasty and fat grafting.  She does want to move ahead with implants.  She does not want to be as because she was with the old implants and she is thinking between 200-250 cc and silicone.     Review of Systems  Constitutional: Negative.   Eyes: Negative.   Respiratory: Negative.    Cardiovascular: Negative.   Gastrointestinal: Negative.   Endocrine: Negative.   Genitourinary: Negative.   Musculoskeletal: Negative.   Skin: Negative.        Objective:   Physical Exam Vitals reviewed.  Constitutional:      Appearance: Normal appearance.  HENT:     Head: Atraumatic.  Cardiovascular:     Rate and Rhythm: Normal rate.     Pulses: Normal pulses.  Pulmonary:     Effort: Pulmonary effort is normal.  Abdominal:     Palpations: Abdomen is soft.  Skin:    General: Skin is warm.     Capillary Refill: Capillary refill takes less than 2 seconds.  Neurological:     Mental Status: She is alert and oriented to person, place, and time.  Psychiatric:        Mood and Affect: Mood normal.        Behavior: Behavior normal.        Thought Content: Thought content normal.        Judgment: Judgment normal.        Assessment & Plan:     ICD-10-CM   1. Malignant neoplasm of upper-outer quadrant of right breast in female, estrogen receptor positive (HCC)  C50.411    Z17.0     2. Breast cancer metastasized to axillary lymph node, right (HCC)  C50.911    C77.3        Pictures  were obtained of the patient and placed in the chart with the patient's or guardian's permission.  Patient would like to move ahead with breast implants for improved symmetry after breast cancer treatment.

## 2024-01-16 ENCOUNTER — Ambulatory Visit

## 2024-01-21 ENCOUNTER — Ambulatory Visit (INDEPENDENT_AMBULATORY_CARE_PROVIDER_SITE_OTHER): Admitting: *Deleted

## 2024-01-21 DIAGNOSIS — Z23 Encounter for immunization: Secondary | ICD-10-CM

## 2024-01-21 NOTE — Progress Notes (Signed)
 Patient is in office today for a nurse visit for Immunization. Patient Injection was given in the  Left deltoid. Patient tolerated injection well.

## 2024-01-24 DIAGNOSIS — D485 Neoplasm of uncertain behavior of skin: Secondary | ICD-10-CM | POA: Diagnosis not present

## 2024-02-06 ENCOUNTER — Other Ambulatory Visit: Payer: Self-pay | Admitting: *Deleted

## 2024-02-06 DIAGNOSIS — Z170 Estrogen receptor positive status [ER+]: Secondary | ICD-10-CM

## 2024-02-10 ENCOUNTER — Inpatient Hospital Stay: Attending: Hematology and Oncology

## 2024-02-10 ENCOUNTER — Inpatient Hospital Stay

## 2024-02-10 VITALS — BP 110/70 | HR 63 | Temp 97.7°F | Resp 15 | Wt 139.2 lb

## 2024-02-10 DIAGNOSIS — Z1721 Progesterone receptor positive status: Secondary | ICD-10-CM | POA: Insufficient documentation

## 2024-02-10 DIAGNOSIS — Z170 Estrogen receptor positive status [ER+]: Secondary | ICD-10-CM | POA: Diagnosis not present

## 2024-02-10 DIAGNOSIS — C773 Secondary and unspecified malignant neoplasm of axilla and upper limb lymph nodes: Secondary | ICD-10-CM | POA: Insufficient documentation

## 2024-02-10 DIAGNOSIS — M810 Age-related osteoporosis without current pathological fracture: Secondary | ICD-10-CM | POA: Diagnosis not present

## 2024-02-10 DIAGNOSIS — Z1732 Human epidermal growth factor receptor 2 negative status: Secondary | ICD-10-CM | POA: Insufficient documentation

## 2024-02-10 DIAGNOSIS — C50411 Malignant neoplasm of upper-outer quadrant of right female breast: Secondary | ICD-10-CM | POA: Insufficient documentation

## 2024-02-10 LAB — CMP (CANCER CENTER ONLY)
ALT: 12 U/L (ref 0–44)
AST: 17 U/L (ref 15–41)
Albumin: 4 g/dL (ref 3.5–5.0)
Alkaline Phosphatase: 67 U/L (ref 38–126)
Anion gap: 6 (ref 5–15)
BUN: 18 mg/dL (ref 6–20)
CO2: 30 mmol/L (ref 22–32)
Calcium: 9 mg/dL (ref 8.9–10.3)
Chloride: 106 mmol/L (ref 98–111)
Creatinine: 0.65 mg/dL (ref 0.44–1.00)
GFR, Estimated: >60 mL/min (ref >60)
Glucose, Bld: 95 mg/dL (ref 70–99)
Potassium: 3.5 mmol/L (ref 3.5–5.1)
Sodium: 142 mmol/L (ref 135–145)
Total Bilirubin: 0.5 mg/dL (ref 0.0–1.2)
Total Protein: 6.7 g/dL (ref 6.5–8.1)

## 2024-02-10 LAB — CBC WITH DIFFERENTIAL (CANCER CENTER ONLY)
Abs Immature Granulocytes: 0.01 K/uL (ref 0.00–0.07)
Basophils Absolute: 0.1 K/uL (ref 0.0–0.1)
Basophils Relative: 2 %
Eosinophils Absolute: 0.2 K/uL (ref 0.0–0.5)
Eosinophils Relative: 5 %
HCT: 36.9 % (ref 36.0–46.0)
Hemoglobin: 12.1 g/dL (ref 12.0–15.0)
Immature Granulocytes: 0 %
Lymphocytes Relative: 28 %
Lymphs Abs: 1.1 K/uL (ref 0.7–4.0)
MCH: 29.2 pg (ref 26.0–34.0)
MCHC: 32.8 g/dL (ref 30.0–36.0)
MCV: 88.9 fL (ref 80.0–100.0)
Monocytes Absolute: 0.5 K/uL (ref 0.1–1.0)
Monocytes Relative: 13 %
Neutro Abs: 2 K/uL (ref 1.7–7.7)
Neutrophils Relative %: 52 %
Platelet Count: 221 K/uL (ref 150–400)
RBC: 4.15 MIL/uL (ref 3.87–5.11)
RDW: 13.4 % (ref 11.5–15.5)
WBC Count: 3.8 K/uL — ABNORMAL LOW (ref 4.0–10.5)
nRBC: 0 % (ref 0.0–0.2)

## 2024-02-10 MED ORDER — ZOLEDRONIC ACID 4 MG/100ML IV SOLN
4.0000 mg | Freq: Once | INTRAVENOUS | Status: AC
  Administered 2024-02-10: 4 mg via INTRAVENOUS
  Filled 2024-02-10: qty 100

## 2024-02-10 MED ORDER — SODIUM CHLORIDE 0.9 % IV SOLN: INTRAVENOUS | Status: DC

## 2024-02-10 NOTE — Patient Instructions (Signed)

## 2024-02-17 DIAGNOSIS — C50411 Malignant neoplasm of upper-outer quadrant of right female breast: Secondary | ICD-10-CM | POA: Diagnosis not present

## 2024-02-17 DIAGNOSIS — Z170 Estrogen receptor positive status [ER+]: Secondary | ICD-10-CM | POA: Diagnosis not present

## 2024-02-18 ENCOUNTER — Ambulatory Visit (INDEPENDENT_AMBULATORY_CARE_PROVIDER_SITE_OTHER): Payer: Self-pay | Admitting: *Deleted

## 2024-02-18 DIAGNOSIS — Z23 Encounter for immunization: Secondary | ICD-10-CM | POA: Diagnosis not present

## 2024-02-18 NOTE — Progress Notes (Signed)
 Patient is in office today for a nurse visit for Immunization. Patient Injection was given in the  Right deltoid. Patient tolerated injection well.

## 2024-02-24 ENCOUNTER — Encounter: Admitting: Student

## 2024-03-04 NOTE — Progress Notes (Unsigned)
 Patient ID: Stephanie Brewer, female    DOB: 1965/12/16, 58 y.o.   MRN: [id number]  Chief Complaint  Patient presents with   Pre-op Exam      ICD-10-CM   1. Malignant neoplasm of upper-outer quadrant of right breast in female, estrogen receptor positive (HCC)  C50.411    Z17.0        History of Present Illness: Stephanie Brewer is a 58 y.o.  female  with a history of breast cancer.  She presents for preoperative evaluation for upcoming procedure, bilateral breast implant placement, scheduled for 03/19/2024 with Dr. Lowery.  The patient has not had problems with anesthesia.  Patient denies any history of cardiac disease.  She does state that she takes Xarelto .  She states that she had a DVT to her left lower extremity in 2021 after her foot surgery, and then she had another DVT in her upper extremity in 2022 after fat grafting surgery.  She states that Dr. Gudena prescribes this for her.  She reports she is not a smoker.  Patient denies taking any birth control or hormone replacement.  She denies any history of greater than 3 miscarriages.  She does have personal history of DVT, but denies any family history.  She states that her brother has been tested for clotting disorders, but she is unsure if he does not fact have a clotting disorder.  She states that she does not have any clotting disorders herself to her knowledge.  She denies any recent surgeries, traumas or infections in the past month.  Patient does report that she had several episodes of diverticulitis in 2024 as well as colon resection.  She denies any issues since then.  She denies any history of stroke or heart attack.  She denies any history of Crohn's disease or ulcerative colitis, COPD or asthma.  Patient denies any varicosities to her lower extremities.  She denies any recent fevers, chills or changes in her health.  Patient states that she is unsure of her current cup size, but would like to be a full B or a small C cup  after surgery.  I discussed with the patient that cup size cannot be guaranteed.  Patient expressed understanding.  Summary of Previous Visit: Patient was seen for consult  by Dr. Lowery on 01/10/2024.  At this visit, it was noted that patient was treated for breast cancer in 2021 that was involving the right breast. She had partial right mastectomy with radiation. She had previous implants placed and these were removed at the same time. They were reported to be 450 cc. She also had a left mastopexy in August 2021 for improved symmetry.  Patient reported that she wanted to move forward with breast implants and reported that she did not want to be as big as she was with her old implants and was thinking between 200-250 cc silicone implants.  Job: Works in systems developer, planning to take 2 weeks off  PMH Significant for: GERD, breast cancer, DVT   Past Medical History: Allergies: Allergies  Allergen Reactions   Hyoscyamine Anaphylaxis   Fosamax  [Alendronate ] Nausea Only and Other (See Comments)    SEVERE NAUSEA   Nexium [Esomeprazole] Swelling and Other (See Comments)    Tongue became swollen- breathing not affected   Doxycycline Palpitations and Other (See Comments)    Racing heart    Current Medications:  Current Outpatient Medications:    anastrozole  (ARIMIDEX ) 1 MG tablet, TAKE 1 TABLET AT  BEDTIME, Disp: 90 tablet, Rfl: 3   cephALEXin  (KEFLEX ) 500 MG capsule, Take 1 capsule (500 mg total) by mouth 4 (four) times daily for 3 days., Disp: 12 capsule, Rfl: 0   furosemide  (LASIX ) 40 MG tablet, Take 1 tablet (40 mg total) by mouth daily as needed for edema., Disp: 90 tablet, Rfl: 3   meclizine  (ANTIVERT ) 12.5 MG tablet, Take 1 tablet (12.5 mg total) by mouth 3 (three) times daily as needed for dizziness., Disp: 30 tablet, Rfl: 0   minocycline (MINOCIN) 100 MG capsule, Take 100 mg by mouth daily as needed., Disp: , Rfl:    ondansetron  (ZOFRAN ) 4 MG tablet, Take 1 tablet (4 mg total)  by mouth every 8 (eight) hours as needed for up to 15 doses for nausea or vomiting., Disp: 15 tablet, Rfl: 0   Rimegepant Sulfate (NURTEC) 75 MG TBDP, Take 1 tablet (75 mg total) by mouth daily as needed (migraine headache)., Disp: , Rfl:    traMADol  (ULTRAM ) 50 MG tablet, Take 1 tablet (50 mg total) by mouth every 8 (eight) hours as needed for up to 15 doses for moderate pain (pain score 4-6) or severe pain (pain score 7-10)., Disp: 15 tablet, Rfl: 0   Vitamin D , Ergocalciferol , (DRISDOL ) 1.25 MG (50000 UNIT) CAPS capsule, Take 1 capsule (50,000 Units total) by mouth every 7 (seven) days., Disp: 12 capsule, Rfl: 3   XARELTO  20 MG TABS tablet, TAKE ONE TABLET ONCE DAILY WITH SUPPER, Disp: 30 tablet, Rfl: 12   zoledronic  acid (ZOMETA ) 4 MG/5ML injection, Inject 4 mg into the vein once. W/ hematology/oncology, every 6 months, Disp: , Rfl:    semaglutide -weight management (WEGOVY ) 0.25 MG/0.5ML SOAJ SQ injection, Inject 0.25 mg into the skin every 7 (seven) days., Disp: 2 mL, Rfl: 0   semaglutide -weight management (WEGOVY ) 0.5 MG/0.5ML SOAJ SQ injection, Inject 0.5 mg into the skin every 7 (seven) days. (Patient not taking: Reported on 01/10/2024), Disp: 6 mL, Rfl: 3  Past Medical Problems: Past Medical History:  Diagnosis Date   Anemia    Anxiety    Breast cancer (HCC)    right breast   Clotting disorder    Displacement of breast implant 12/24/2017   Diverticulitis    Diverticulosis    DVT (deep venous thrombosis) (HCC)    left leg knee and ankle    GERD (gastroesophageal reflux disease)    History of kidney stones    Kidney stones    Migraines    Osteoporosis    Pneumonia    Pre-diabetes    Sigmoid diverticulitis 08/17/2022    Past Surgical History: Past Surgical History:  Procedure Laterality Date   BREAST BIOPSY Left 08/15/2021   BREAST ENHANCEMENT SURGERY     Augmentation 2003   BREAST IMPLANT REMOVAL Bilateral 01/14/2020   Procedure: REMOVAL BREAST IMPLANTS;  Surgeon: Elisabeth Craig RAMAN, MD;  Location: [facility];  Service: Plastics;  Laterality: Bilateral;   BREAST LUMPECTOMY Right 01/2020   BREAST LUMPECTOMY WITH RADIOACTIVE SEED AND SENTINEL LYMPH NODE BIOPSY Right 01/14/2020   Procedure: Right breast seed localized lumpectomy with sentinel lymph node biopsy;  Surgeon: Ebbie Cough, MD;  Location: [facility];  Service: General;  Laterality: Right;   BUNIONECTOMY Bilateral 2005   Right foot in 2006   COLON RESECTION  08/2022   LIPOSUCTION WITH LIPOFILLING Bilateral 11/29/2020   Procedure: LIPOSUCTION WITH LIPOFILLING FROM ABDOMEN TO BILATERAL BREASTS;  Surgeon: Elisabeth Craig RAMAN, MD;  Location:  SURGERY CENTER;  Service: Government Social Research Officer;  Laterality: Bilateral;   MASTOPEXY Left 11/29/2020   Procedure: LEFT MASTOPEXY;  Surgeon: Elisabeth Craig RAMAN, MD;  Location: [facility];  Service: Plastics;  Laterality: Left;   PORTACATH PLACEMENT N/A 09/01/2019   Procedure: INSERTION PORT-A-CATH WITH ULTRASOUND GUIDANCE;  Surgeon: Ebbie Cough, MD;  Location: [facility];  Service: General;  Laterality: N/A;   PROCTOSCOPY N/A 08/17/2022   Procedure: RIGID PROCTOSCOPY;  Surgeon: Sheldon Standing, MD;  Location: [facility];  Service: General;  Laterality: N/A;   TRIGGER FINGER RELEASE Left 05/09/2020   Procedure: RELEASE TRIGGER FINGER/A-1 PULLEY LEFT LONG;  Surgeon: Murrell Drivers, MD;  Location: [facility];  Service: Orthopedics;  Laterality: Left;   TRIGGER FINGER RELEASE Right 08/02/2023   Procedure: RIGHT THUMB AND RIGHT LONG FINGER A1 PULLEY TRIGGER RELEASE;  Surgeon: Murrell Drivers, MD;  Location:  SURGERY CENTER;  Service: Orthopedics;  Laterality: Right;  RIGHT THUMB AND RIGHT LONG FINGER TRIGGER RELEASES   UMBILICAL HERNIA REPAIR  2002    Social History: Social History   Socioeconomic History   Marital status: Married    Spouse name: Tim   Number of children: 2   Years of education:  some college   Highest education level: Some college, no degree  Occupational History   Occupation: [employer], Corporate  Tobacco Use   Smoking status: Never   Smokeless tobacco: Never  Vaping Use   Vaping status: Never Used  Substance and Sexual Activity   Alcohol use: Yes    Comment: 1 PER MONTH   Drug use: No   Sexual activity: Yes  Other Topics Concern   Not on file  Social History Narrative   Lives with spouse   Caffeine use: no caffeine    Left handed    Works for an Chartered Certified Accountant to read fiction   Social Drivers of Corporate Investment Banker Strain: Low Risk  (12/23/2023)   Overall Financial Resource Strain (CARDIA)    Difficulty of Paying Living Expenses: Not hard at all  Food Insecurity: No Food Insecurity (12/23/2023)   Hunger Vital Sign    Worried About Running Out of Food in the Last Year: Never true    Ran Out of Food in the Last Year: Never true  Transportation Needs: No Transportation Needs (12/23/2023)   PRAPARE - Administrator, Civil Service (Medical): No    Lack of Transportation (Non-Medical): No  Physical Activity: Insufficiently Active (12/23/2023)   Exercise Vital Sign    Days of Exercise per Week: 2 days    Minutes of Exercise per Session: 30 min  Stress: No Stress Concern Present (12/23/2023)   [employer] of Occupational Health - Occupational Stress Questionnaire    Feeling of Stress: Only a little  Social Connections: Socially Integrated (12/23/2023)   Social Connection and Isolation Panel    Frequency of Communication with Friends and Family: More than three times a week    Frequency of Social Gatherings with Friends and Family: Once a week    Attends Religious Services: More than 4 times per year    Active Member of [employer] or Organizations: Yes    Attends Engineer, Structural: More than 4 times per year    Marital Status: Married  Catering Manager Violence: Not At Risk (06/25/2023)   Humiliation, Afraid, Rape,  and Kick questionnaire    Fear of Current or Ex-Partner: No    Emotionally Abused: No  Physically Abused: No    Sexually Abused: No    Family History: Family History  Problem Relation Age of Onset   Hyperlipidemia Mother    Polymyalgia rheumatica Mother    Colon polyps Father    Hypertension Father    Hyperlipidemia Father    Diabetes type I Brother    Ovarian cancer Maternal Aunt    Diabetes type II Maternal Grandfather    Allergies Daughter    Colon cancer Neg Hx    Esophageal cancer Neg Hx    Rectal cancer Neg Hx    Stomach cancer Neg Hx     Review of Systems: Denies any recent fevers, chills or changes in her health  Physical Exam: Vital Signs BP 116/71 (BP Location: Left Arm, Patient Position: Sitting)   Pulse 72   Ht 5' 4 (1.626 m)   Wt 136 lb 6.4 oz (61.9 kg)   SpO2 100%   BMI 23.41 kg/m   Physical Exam  Constitutional:      General: Not in acute distress.    Appearance: Normal appearance. Not ill-appearing.  HENT:     Head: Normocephalic and atraumatic.  Eyes:     Pupils: Pupils are equal, round Neck:     Musculoskeletal: Normal range of motion.  Cardiovascular:     Rate and Rhythm: Normal rate Pulmonary:     Effort: Pulmonary effort is normal. No respiratory distress.  Musculoskeletal: Normal range of motion.  Skin:    General: Skin is warm and dry.     Findings: No erythema or rash.  Neurological:     Mental Status: Alert and oriented to person, place, and time. Mental status is at baseline.  Psychiatric:        Mood and Affect: Mood normal.        Behavior: Behavior normal.    Assessment/Plan: The patient is scheduled for bilateral breast implant placement with Dr. Lowery.  Risks, benefits, and alternatives of procedure discussed, questions answered and consent obtained.   Clearance to be sent to Dr. Gudena for patient to hold Xarelto  prior to surgery and to inquire about possible Lovenox  bridge.  Clearance form was given to clinical  staff to fax off.  Smoking Status: Non-smoker; Counseling Given?  N/A Last Mammogram: 08/19/2023; Results: BI-RADS Category 1 negative  Caprini Score: 8; Risk Factors include: Age, history of malignancy, history of DVT, and length of planned surgery. Recommendation for mechanical and possible pharmacological prophylaxis.  Most likely patient will restart her Xarelto  postoperatively, but I will discuss postoperative anticoagulation with Dr. Lowery.  Encourage early ambulation.   Pictures obtained: @consult   Post-op Rx sent to pharmacy: Keflex , Zofran , tramadol -patient states that she has taken tramadol  in the past and has done well with this.  She states that she does not do well with oxycodone   Instructed the patient to hold her multivitamins and supplements at least 1 week prior to surgery.  Discussed with her to hold her Lasix  and Nurtec the day of surgery.  Patient states that she is no longer taking Wegovy .  Patient was provided with the [facility] implant checklist and General Surgical Risk consent document and Pain Medication Agreement prior to their appointment.  They had adequate time to read through the risk consent documents and Pain Medication Agreement. We also discussed them in person together during this preop appointment. All of their questions were answered to their satisfaction.  Recommended calling if they have any further questions.  Risk consent form and Pain Medication Agreement to  be scanned into patient's chart.  The risk that can be encountered with breast implants / breast implant surgery were discussed and include the following but not limited to these:  Breast asymmetry, fluid accumulation, firmness of the breast, skin loss, decrease or no nipple sensation, fat necrosis of the breast tissue, bleeding, infection, healing delay.  Deep vein thrombosis, cardiac and pulmonary complications are risks to any procedure. The implant can have a faulty position or one different from  what you had desired.  The implant can have rippling, wrinkling, leakage or rupture. There are risks of anesthesia, changes to skin sensation and injury to nerves or blood vessels.  The muscle can be temporarily or permanently injured.  You may have an allergic reaction to tape, suture, glue, blood products which can result in skin discoloration, swelling, pain, skin lesions, poor healing.  Any of these can lead to the need for revisonal surgery or stage procedures. Nipple or breast piercing can increase risks of infection.    Changes in the breast will continue to occur over time.  Weight gain and weigh loss can  effect the long term appearance. Implants are not guaranteed to last a lifetime.  Future surgery may be required.  Regular examinations of the breast are required to evaluate the condition of your breasts and implants.   The consent was obtained with risks and complications reviewed which included bleeding, pain, scar, infection and the risk of anesthesia.  The patients questions were answered to the patients expressed satisfaction.    Electronically signed by: Estefana FORBES Peck, PA-C 03/06/2024 7:40 AM

## 2024-03-04 NOTE — H&P (View-Only) (Signed)
 Patient ID: Stephanie Brewer, female    DOB: 1965/12/16, 58 y.o.   MRN: [id number]  Chief Complaint  Patient presents with   Pre-op Exam      ICD-10-CM   1. Malignant neoplasm of upper-outer quadrant of right breast in female, estrogen receptor positive (HCC)  C50.411    Z17.0        History of Present Illness: Stephanie Brewer is a 58 y.o.  female  with a history of breast cancer.  She presents for preoperative evaluation for upcoming procedure, bilateral breast implant placement, scheduled for 03/19/2024 with Dr. Lowery.  The patient has not had problems with anesthesia.  Patient denies any history of cardiac disease.  She does state that she takes Xarelto .  She states that she had a DVT to her left lower extremity in 2021 after her foot surgery, and then she had another DVT in her upper extremity in 2022 after fat grafting surgery.  She states that Dr. Gudena prescribes this for her.  She reports she is not a smoker.  Patient denies taking any birth control or hormone replacement.  She denies any history of greater than 3 miscarriages.  She does have personal history of DVT, but denies any family history.  She states that her brother has been tested for clotting disorders, but she is unsure if he does not fact have a clotting disorder.  She states that she does not have any clotting disorders herself to her knowledge.  She denies any recent surgeries, traumas or infections in the past month.  Patient does report that she had several episodes of diverticulitis in 2024 as well as colon resection.  She denies any issues since then.  She denies any history of stroke or heart attack.  She denies any history of Crohn's disease or ulcerative colitis, COPD or asthma.  Patient denies any varicosities to her lower extremities.  She denies any recent fevers, chills or changes in her health.  Patient states that she is unsure of her current cup size, but would like to be a full B or a small C cup  after surgery.  I discussed with the patient that cup size cannot be guaranteed.  Patient expressed understanding.  Summary of Previous Visit: Patient was seen for consult  by Dr. Lowery on 01/10/2024.  At this visit, it was noted that patient was treated for breast cancer in 2021 that was involving the right breast. She had partial right mastectomy with radiation. She had previous implants placed and these were removed at the same time. They were reported to be 450 cc. She also had a left mastopexy in August 2021 for improved symmetry.  Patient reported that she wanted to move forward with breast implants and reported that she did not want to be as big as she was with her old implants and was thinking between 200-250 cc silicone implants.  Job: Works in systems developer, planning to take 2 weeks off  PMH Significant for: GERD, breast cancer, DVT   Past Medical History: Allergies: Allergies  Allergen Reactions   Hyoscyamine Anaphylaxis   Fosamax  [Alendronate ] Nausea Only and Other (See Comments)    SEVERE NAUSEA   Nexium [Esomeprazole] Swelling and Other (See Comments)    Tongue became swollen- breathing not affected   Doxycycline Palpitations and Other (See Comments)    Racing heart    Current Medications:  Current Outpatient Medications:    anastrozole  (ARIMIDEX ) 1 MG tablet, TAKE 1 TABLET AT  BEDTIME, Disp: 90 tablet, Rfl: 3   cephALEXin  (KEFLEX ) 500 MG capsule, Take 1 capsule (500 mg total) by mouth 4 (four) times daily for 3 days., Disp: 12 capsule, Rfl: 0   furosemide  (LASIX ) 40 MG tablet, Take 1 tablet (40 mg total) by mouth daily as needed for edema., Disp: 90 tablet, Rfl: 3   meclizine  (ANTIVERT ) 12.5 MG tablet, Take 1 tablet (12.5 mg total) by mouth 3 (three) times daily as needed for dizziness., Disp: 30 tablet, Rfl: 0   minocycline (MINOCIN) 100 MG capsule, Take 100 mg by mouth daily as needed., Disp: , Rfl:    ondansetron  (ZOFRAN ) 4 MG tablet, Take 1 tablet (4 mg total)  by mouth every 8 (eight) hours as needed for up to 15 doses for nausea or vomiting., Disp: 15 tablet, Rfl: 0   Rimegepant Sulfate (NURTEC) 75 MG TBDP, Take 1 tablet (75 mg total) by mouth daily as needed (migraine headache)., Disp: , Rfl:    traMADol  (ULTRAM ) 50 MG tablet, Take 1 tablet (50 mg total) by mouth every 8 (eight) hours as needed for up to 15 doses for moderate pain (pain score 4-6) or severe pain (pain score 7-10)., Disp: 15 tablet, Rfl: 0   Vitamin D , Ergocalciferol , (DRISDOL ) 1.25 MG (50000 UNIT) CAPS capsule, Take 1 capsule (50,000 Units total) by mouth every 7 (seven) days., Disp: 12 capsule, Rfl: 3   XARELTO  20 MG TABS tablet, TAKE ONE TABLET ONCE DAILY WITH SUPPER, Disp: 30 tablet, Rfl: 12   zoledronic  acid (ZOMETA ) 4 MG/5ML injection, Inject 4 mg into the vein once. W/ hematology/oncology, every 6 months, Disp: , Rfl:    semaglutide -weight management (WEGOVY ) 0.25 MG/0.5ML SOAJ SQ injection, Inject 0.25 mg into the skin every 7 (seven) days., Disp: 2 mL, Rfl: 0   semaglutide -weight management (WEGOVY ) 0.5 MG/0.5ML SOAJ SQ injection, Inject 0.5 mg into the skin every 7 (seven) days. (Patient not taking: Reported on 01/10/2024), Disp: 6 mL, Rfl: 3  Past Medical Problems: Past Medical History:  Diagnosis Date   Anemia    Anxiety    Breast cancer (HCC)    right breast   Clotting disorder    Displacement of breast implant 12/24/2017   Diverticulitis    Diverticulosis    DVT (deep venous thrombosis) (HCC)    left leg knee and ankle    GERD (gastroesophageal reflux disease)    History of kidney stones    Kidney stones    Migraines    Osteoporosis    Pneumonia    Pre-diabetes    Sigmoid diverticulitis 08/17/2022    Past Surgical History: Past Surgical History:  Procedure Laterality Date   BREAST BIOPSY Left 08/15/2021   BREAST ENHANCEMENT SURGERY     Augmentation 2003   BREAST IMPLANT REMOVAL Bilateral 01/14/2020   Procedure: REMOVAL BREAST IMPLANTS;  Surgeon: Elisabeth Craig RAMAN, MD;  Location: [facility];  Service: Plastics;  Laterality: Bilateral;   BREAST LUMPECTOMY Right 01/2020   BREAST LUMPECTOMY WITH RADIOACTIVE SEED AND SENTINEL LYMPH NODE BIOPSY Right 01/14/2020   Procedure: Right breast seed localized lumpectomy with sentinel lymph node biopsy;  Surgeon: Ebbie Cough, MD;  Location: [facility];  Service: General;  Laterality: Right;   BUNIONECTOMY Bilateral 2005   Right foot in 2006   COLON RESECTION  08/2022   LIPOSUCTION WITH LIPOFILLING Bilateral 11/29/2020   Procedure: LIPOSUCTION WITH LIPOFILLING FROM ABDOMEN TO BILATERAL BREASTS;  Surgeon: Elisabeth Craig RAMAN, MD;  Location:  SURGERY CENTER;  Service: Government Social Research Officer;  Laterality: Bilateral;   MASTOPEXY Left 11/29/2020   Procedure: LEFT MASTOPEXY;  Surgeon: Elisabeth Craig RAMAN, MD;  Location: [facility];  Service: Plastics;  Laterality: Left;   PORTACATH PLACEMENT N/A 09/01/2019   Procedure: INSERTION PORT-A-CATH WITH ULTRASOUND GUIDANCE;  Surgeon: Ebbie Cough, MD;  Location: [facility];  Service: General;  Laterality: N/A;   PROCTOSCOPY N/A 08/17/2022   Procedure: RIGID PROCTOSCOPY;  Surgeon: Sheldon Standing, MD;  Location: [facility];  Service: General;  Laterality: N/A;   TRIGGER FINGER RELEASE Left 05/09/2020   Procedure: RELEASE TRIGGER FINGER/A-1 PULLEY LEFT LONG;  Surgeon: Murrell Drivers, MD;  Location: [facility];  Service: Orthopedics;  Laterality: Left;   TRIGGER FINGER RELEASE Right 08/02/2023   Procedure: RIGHT THUMB AND RIGHT LONG FINGER A1 PULLEY TRIGGER RELEASE;  Surgeon: Murrell Drivers, MD;  Location:  SURGERY CENTER;  Service: Orthopedics;  Laterality: Right;  RIGHT THUMB AND RIGHT LONG FINGER TRIGGER RELEASES   UMBILICAL HERNIA REPAIR  2002    Social History: Social History   Socioeconomic History   Marital status: Married    Spouse name: Tim   Number of children: 2   Years of education:  some college   Highest education level: Some college, no degree  Occupational History   Occupation: [employer], Corporate  Tobacco Use   Smoking status: Never   Smokeless tobacco: Never  Vaping Use   Vaping status: Never Used  Substance and Sexual Activity   Alcohol use: Yes    Comment: 1 PER MONTH   Drug use: No   Sexual activity: Yes  Other Topics Concern   Not on file  Social History Narrative   Lives with spouse   Caffeine use: no caffeine    Left handed    Works for an Chartered Certified Accountant to read fiction   Social Drivers of Corporate Investment Banker Strain: Low Risk  (12/23/2023)   Overall Financial Resource Strain (CARDIA)    Difficulty of Paying Living Expenses: Not hard at all  Food Insecurity: No Food Insecurity (12/23/2023)   Hunger Vital Sign    Worried About Running Out of Food in the Last Year: Never true    Ran Out of Food in the Last Year: Never true  Transportation Needs: No Transportation Needs (12/23/2023)   PRAPARE - Administrator, Civil Service (Medical): No    Lack of Transportation (Non-Medical): No  Physical Activity: Insufficiently Active (12/23/2023)   Exercise Vital Sign    Days of Exercise per Week: 2 days    Minutes of Exercise per Session: 30 min  Stress: No Stress Concern Present (12/23/2023)   [employer] of Occupational Health - Occupational Stress Questionnaire    Feeling of Stress: Only a little  Social Connections: Socially Integrated (12/23/2023)   Social Connection and Isolation Panel    Frequency of Communication with Friends and Family: More than three times a week    Frequency of Social Gatherings with Friends and Family: Once a week    Attends Religious Services: More than 4 times per year    Active Member of [employer] or Organizations: Yes    Attends Engineer, Structural: More than 4 times per year    Marital Status: Married  Catering Manager Violence: Not At Risk (06/25/2023)   Humiliation, Afraid, Rape,  and Kick questionnaire    Fear of Current or Ex-Partner: No    Emotionally Abused: No  Physically Abused: No    Sexually Abused: No    Family History: Family History  Problem Relation Age of Onset   Hyperlipidemia Mother    Polymyalgia rheumatica Mother    Colon polyps Father    Hypertension Father    Hyperlipidemia Father    Diabetes type I Brother    Ovarian cancer Maternal Aunt    Diabetes type II Maternal Grandfather    Allergies Daughter    Colon cancer Neg Hx    Esophageal cancer Neg Hx    Rectal cancer Neg Hx    Stomach cancer Neg Hx     Review of Systems: Denies any recent fevers, chills or changes in her health  Physical Exam: Vital Signs BP 116/71 (BP Location: Left Arm, Patient Position: Sitting)   Pulse 72   Ht 5' 4 (1.626 m)   Wt 136 lb 6.4 oz (61.9 kg)   SpO2 100%   BMI 23.41 kg/m   Physical Exam  Constitutional:      General: Not in acute distress.    Appearance: Normal appearance. Not ill-appearing.  HENT:     Head: Normocephalic and atraumatic.  Eyes:     Pupils: Pupils are equal, round Neck:     Musculoskeletal: Normal range of motion.  Cardiovascular:     Rate and Rhythm: Normal rate Pulmonary:     Effort: Pulmonary effort is normal. No respiratory distress.  Musculoskeletal: Normal range of motion.  Skin:    General: Skin is warm and dry.     Findings: No erythema or rash.  Neurological:     Mental Status: Alert and oriented to person, place, and time. Mental status is at baseline.  Psychiatric:        Mood and Affect: Mood normal.        Behavior: Behavior normal.    Assessment/Plan: The patient is scheduled for bilateral breast implant placement with Dr. Lowery.  Risks, benefits, and alternatives of procedure discussed, questions answered and consent obtained.   Clearance to be sent to Dr. Gudena for patient to hold Xarelto  prior to surgery and to inquire about possible Lovenox  bridge.  Clearance form was given to clinical  staff to fax off.  Smoking Status: Non-smoker; Counseling Given?  N/A Last Mammogram: 08/19/2023; Results: BI-RADS Category 1 negative  Caprini Score: 8; Risk Factors include: Age, history of malignancy, history of DVT, and length of planned surgery. Recommendation for mechanical and possible pharmacological prophylaxis.  Most likely patient will restart her Xarelto  postoperatively, but I will discuss postoperative anticoagulation with Dr. Lowery.  Encourage early ambulation.   Pictures obtained: @consult   Post-op Rx sent to pharmacy: Keflex , Zofran , tramadol -patient states that she has taken tramadol  in the past and has done well with this.  She states that she does not do well with oxycodone   Instructed the patient to hold her multivitamins and supplements at least 1 week prior to surgery.  Discussed with her to hold her Lasix  and Nurtec the day of surgery.  Patient states that she is no longer taking Wegovy .  Patient was provided with the [facility] implant checklist and General Surgical Risk consent document and Pain Medication Agreement prior to their appointment.  They had adequate time to read through the risk consent documents and Pain Medication Agreement. We also discussed them in person together during this preop appointment. All of their questions were answered to their satisfaction.  Recommended calling if they have any further questions.  Risk consent form and Pain Medication Agreement to  be scanned into patient's chart.  The risk that can be encountered with breast implants / breast implant surgery were discussed and include the following but not limited to these:  Breast asymmetry, fluid accumulation, firmness of the breast, skin loss, decrease or no nipple sensation, fat necrosis of the breast tissue, bleeding, infection, healing delay.  Deep vein thrombosis, cardiac and pulmonary complications are risks to any procedure. The implant can have a faulty position or one different from  what you had desired.  The implant can have rippling, wrinkling, leakage or rupture. There are risks of anesthesia, changes to skin sensation and injury to nerves or blood vessels.  The muscle can be temporarily or permanently injured.  You may have an allergic reaction to tape, suture, glue, blood products which can result in skin discoloration, swelling, pain, skin lesions, poor healing.  Any of these can lead to the need for revisonal surgery or stage procedures. Nipple or breast piercing can increase risks of infection.    Changes in the breast will continue to occur over time.  Weight gain and weigh loss can  effect the long term appearance. Implants are not guaranteed to last a lifetime.  Future surgery may be required.  Regular examinations of the breast are required to evaluate the condition of your breasts and implants.   The consent was obtained with risks and complications reviewed which included bleeding, pain, scar, infection and the risk of anesthesia.  The patients questions were answered to the patients expressed satisfaction.    Electronically signed by: Estefana FORBES Peck, PA-C 03/06/2024 7:40 AM

## 2024-03-05 ENCOUNTER — Ambulatory Visit (INDEPENDENT_AMBULATORY_CARE_PROVIDER_SITE_OTHER): Admitting: Student

## 2024-03-05 VITALS — BP 116/71 | HR 72 | Ht 64.0 in | Wt 136.4 lb

## 2024-03-05 DIAGNOSIS — Z9011 Acquired absence of right breast and nipple: Secondary | ICD-10-CM | POA: Diagnosis not present

## 2024-03-05 DIAGNOSIS — Z170 Estrogen receptor positive status [ER+]: Secondary | ICD-10-CM | POA: Diagnosis not present

## 2024-03-05 DIAGNOSIS — Z853 Personal history of malignant neoplasm of breast: Secondary | ICD-10-CM | POA: Diagnosis not present

## 2024-03-05 DIAGNOSIS — Z719 Counseling, unspecified: Secondary | ICD-10-CM

## 2024-03-06 MED ORDER — ONDANSETRON HCL 4 MG PO TABS: 4.0000 mg | ORAL_TABLET | Freq: Three times a day (TID) | ORAL | 0 refills | Status: AC | PRN

## 2024-03-06 MED ORDER — TRAMADOL HCL 50 MG PO TABS: 50.0000 mg | ORAL_TABLET | Freq: Three times a day (TID) | ORAL | 0 refills | Status: AC | PRN

## 2024-03-06 MED ORDER — CEPHALEXIN 500 MG PO CAPS: 500.0000 mg | ORAL_CAPSULE | Freq: Four times a day (QID) | ORAL | 0 refills | Status: AC

## 2024-03-09 ENCOUNTER — Telehealth: Payer: Self-pay

## 2024-03-09 NOTE — Telephone Encounter (Signed)
 Received confirmation of successful fax transmission of surgical clearance.

## 2024-03-10 ENCOUNTER — Telehealth: Payer: Self-pay

## 2024-03-10 NOTE — Telephone Encounter (Signed)
 I called the patient to let her know we received clearance back and Dr. Odean stated it was okay for her to stop her Xarelto  2-3 days before her procedure. I also let her know that from our standpoint she is okay to resume her Xarelto  24 hours after. Patient conveyed understanding.

## 2024-03-12 ENCOUNTER — Other Ambulatory Visit: Payer: Self-pay

## 2024-03-12 ENCOUNTER — Encounter (HOSPITAL_BASED_OUTPATIENT_CLINIC_OR_DEPARTMENT_OTHER): Payer: Self-pay | Admitting: Plastic Surgery

## 2024-03-17 ENCOUNTER — Encounter (HOSPITAL_BASED_OUTPATIENT_CLINIC_OR_DEPARTMENT_OTHER)
Admission: RE | Admit: 2024-03-17 | Discharge: 2024-03-17 | Disposition: A | Source: Ambulatory Visit | Attending: Plastic Surgery

## 2024-03-17 DIAGNOSIS — N6489 Other specified disorders of breast: Secondary | ICD-10-CM | POA: Diagnosis not present

## 2024-03-17 DIAGNOSIS — Z853 Personal history of malignant neoplasm of breast: Secondary | ICD-10-CM | POA: Diagnosis not present

## 2024-03-17 DIAGNOSIS — Z9882 Breast implant status: Secondary | ICD-10-CM | POA: Diagnosis not present

## 2024-03-17 DIAGNOSIS — Z79899 Other long term (current) drug therapy: Secondary | ICD-10-CM | POA: Insufficient documentation

## 2024-03-17 DIAGNOSIS — Z01812 Encounter for preprocedural laboratory examination: Secondary | ICD-10-CM | POA: Insufficient documentation

## 2024-03-17 DIAGNOSIS — Z86718 Personal history of other venous thrombosis and embolism: Secondary | ICD-10-CM | POA: Diagnosis not present

## 2024-03-17 DIAGNOSIS — C50411 Malignant neoplasm of upper-outer quadrant of right female breast: Secondary | ICD-10-CM | POA: Diagnosis not present

## 2024-03-17 DIAGNOSIS — E785 Hyperlipidemia, unspecified: Secondary | ICD-10-CM | POA: Diagnosis not present

## 2024-03-17 DIAGNOSIS — Z170 Estrogen receptor positive status [ER+]: Secondary | ICD-10-CM | POA: Diagnosis not present

## 2024-03-17 DIAGNOSIS — F419 Anxiety disorder, unspecified: Secondary | ICD-10-CM | POA: Diagnosis not present

## 2024-03-17 DIAGNOSIS — Z7901 Long term (current) use of anticoagulants: Secondary | ICD-10-CM | POA: Diagnosis not present

## 2024-03-17 DIAGNOSIS — K219 Gastro-esophageal reflux disease without esophagitis: Secondary | ICD-10-CM | POA: Diagnosis not present

## 2024-03-17 LAB — BASIC METABOLIC PANEL WITH GFR
Anion gap: 7 (ref 5–15)
BUN: 15 mg/dL (ref 6–20)
CO2: 28 mmol/L (ref 22–32)
Calcium: 9.3 mg/dL (ref 8.9–10.3)
Chloride: 106 mmol/L (ref 98–111)
Creatinine, Ser: 0.57 mg/dL (ref 0.44–1.00)
GFR, Estimated: >60 mL/min (ref >60)
Glucose, Bld: 93 mg/dL (ref 70–99)
Potassium: 4.7 mmol/L (ref 3.5–5.1)
Sodium: 141 mmol/L (ref 135–145)

## 2024-03-19 ENCOUNTER — Encounter (HOSPITAL_BASED_OUTPATIENT_CLINIC_OR_DEPARTMENT_OTHER): Admission: RE | Disposition: A | Payer: Self-pay | Attending: Plastic Surgery

## 2024-03-19 ENCOUNTER — Other Ambulatory Visit: Payer: Self-pay

## 2024-03-19 ENCOUNTER — Ambulatory Visit (HOSPITAL_BASED_OUTPATIENT_CLINIC_OR_DEPARTMENT_OTHER): Admitting: Anesthesiology

## 2024-03-19 ENCOUNTER — Ambulatory Visit (HOSPITAL_BASED_OUTPATIENT_CLINIC_OR_DEPARTMENT_OTHER)
Admission: RE | Admit: 2024-03-19 | Discharge: 2024-03-19 | Disposition: A | Discharge status: Home / Self Care | Attending: Plastic Surgery | Admitting: Plastic Surgery

## 2024-03-19 ENCOUNTER — Encounter (HOSPITAL_BASED_OUTPATIENT_CLINIC_OR_DEPARTMENT_OTHER): Payer: Self-pay | Admitting: Plastic Surgery

## 2024-03-19 DIAGNOSIS — E785 Hyperlipidemia, unspecified: Secondary | ICD-10-CM | POA: Insufficient documentation

## 2024-03-19 DIAGNOSIS — Z853 Personal history of malignant neoplasm of breast: Secondary | ICD-10-CM | POA: Diagnosis not present

## 2024-03-19 DIAGNOSIS — Z9882 Breast implant status: Secondary | ICD-10-CM | POA: Insufficient documentation

## 2024-03-19 DIAGNOSIS — C50411 Malignant neoplasm of upper-outer quadrant of right female breast: Secondary | ICD-10-CM | POA: Diagnosis not present

## 2024-03-19 DIAGNOSIS — K219 Gastro-esophageal reflux disease without esophagitis: Secondary | ICD-10-CM | POA: Diagnosis not present

## 2024-03-19 DIAGNOSIS — F419 Anxiety disorder, unspecified: Secondary | ICD-10-CM | POA: Insufficient documentation

## 2024-03-19 DIAGNOSIS — N651 Disproportion of reconstructed breast: Secondary | ICD-10-CM | POA: Diagnosis not present

## 2024-03-19 DIAGNOSIS — Z170 Estrogen receptor positive status [ER+]: Secondary | ICD-10-CM | POA: Diagnosis not present

## 2024-03-19 DIAGNOSIS — Z86718 Personal history of other venous thrombosis and embolism: Secondary | ICD-10-CM | POA: Diagnosis not present

## 2024-03-19 DIAGNOSIS — Z7901 Long term (current) use of anticoagulants: Secondary | ICD-10-CM | POA: Insufficient documentation

## 2024-03-19 DIAGNOSIS — N6489 Other specified disorders of breast: Secondary | ICD-10-CM | POA: Diagnosis not present

## 2024-03-19 DIAGNOSIS — Z79899 Other long term (current) drug therapy: Secondary | ICD-10-CM

## 2024-03-19 HISTORY — PX: PLACEMENT OF BREAST IMPLANTS: SHX6334

## 2024-03-19 SURGERY — INSERTION, IMPLANT, BREAST
Anesthesia: General | Site: Breast | Laterality: Bilateral

## 2024-03-19 MED ORDER — ACETAMINOPHEN 500 MG PO TABS
ORAL_TABLET | ORAL | Status: AC
  Filled 2024-03-19: qty 2

## 2024-03-19 MED ORDER — AMISULPRIDE (ANTIEMETIC) 5 MG/2ML IV SOLN: 10.0000 mg | Freq: Once | INTRAVENOUS | Status: DC | PRN

## 2024-03-19 MED ORDER — FENTANYL CITRATE (PF) 100 MCG/2ML IJ SOLN: 25.0000 ug | INTRAMUSCULAR | Status: DC | PRN

## 2024-03-19 MED ORDER — LIDOCAINE-EPINEPHRINE 1 %-1:100000 IJ SOLN
INTRAMUSCULAR | Status: DC | PRN
  Administered 2024-03-19: 09:00:00 20 mL

## 2024-03-19 MED ORDER — CHLORHEXIDINE GLUCONATE CLOTH 2 % EX PADS: 6.0000 | MEDICATED_PAD | Freq: Once | CUTANEOUS | Status: DC

## 2024-03-19 MED ORDER — LIDOCAINE HCL (CARDIAC) PF 100 MG/5ML IV SOSY
PREFILLED_SYRINGE | INTRAVENOUS | Status: DC | PRN
  Administered 2024-03-19: 09:00:00 100 mg via INTRAVENOUS

## 2024-03-19 MED ORDER — MIDAZOLAM HCL 5 MG/5ML IJ SOLN
INTRAMUSCULAR | Status: DC | PRN
  Administered 2024-03-19: 09:00:00 2 mg via INTRAVENOUS

## 2024-03-19 MED ORDER — FENTANYL CITRATE (PF) 100 MCG/2ML IJ SOLN
INTRAMUSCULAR | Status: AC
  Filled 2024-03-19: qty 2

## 2024-03-19 MED ORDER — BUPIVACAINE LIPOSOME 1.3 % IJ SUSP
INTRAMUSCULAR | Status: AC
  Filled 2024-03-19: qty 10

## 2024-03-19 MED ORDER — BUPIVACAINE LIPOSOME 1.3 % IJ SUSP
INTRAMUSCULAR | Status: DC | PRN
  Administered 2024-03-19: 09:00:00 20 mL

## 2024-03-19 MED ORDER — CEFAZOLIN SODIUM-DEXTROSE 2-4 GM/100ML-% IV SOLN
INTRAVENOUS | Status: AC
  Filled 2024-03-19: qty 100

## 2024-03-19 MED ORDER — VASHE WOUND IRRIGATION OPTIME
TOPICAL | Status: DC | PRN
  Administered 2024-03-19: 08:00:00 34 [oz_av]

## 2024-03-19 MED ORDER — SODIUM CHLORIDE 0.9% FLUSH: 3.0000 mL | Freq: Two times a day (BID) | INTRAVENOUS | Status: DC

## 2024-03-19 MED ORDER — ATROPINE SULFATE (PF) 0.4 MG/ML IJ SOLN
INTRAMUSCULAR | Status: AC
  Filled 2024-03-19: qty 1

## 2024-03-19 MED ORDER — EPHEDRINE 5 MG/ML INJ
INTRAVENOUS | Status: AC
  Filled 2024-03-19: qty 5

## 2024-03-19 MED ORDER — ACETAMINOPHEN 325 MG RE SUPP: 650.0000 mg | RECTAL | Status: DC | PRN

## 2024-03-19 MED ORDER — CEFAZOLIN SODIUM-DEXTROSE 2-4 GM/100ML-% IV SOLN
2.0000 g | INTRAVENOUS | Status: AC
  Administered 2024-03-19: 08:00:00 2 g via INTRAVENOUS

## 2024-03-19 MED ORDER — FENTANYL CITRATE (PF) 100 MCG/2ML IJ SOLN
INTRAMUSCULAR | Status: DC | PRN
  Administered 2024-03-19: 09:00:00 100 ug via INTRAVENOUS

## 2024-03-19 MED ORDER — ONDANSETRON HCL 4 MG/2ML IJ SOLN
INTRAMUSCULAR | Status: AC
  Filled 2024-03-19: qty 2

## 2024-03-19 MED ORDER — SODIUM CHLORIDE 0.9% FLUSH: 3.0000 mL | INTRAVENOUS | Status: DC | PRN

## 2024-03-19 MED ORDER — PROPOFOL 10 MG/ML IV BOLUS
INTRAVENOUS | Status: DC | PRN
  Administered 2024-03-19: 09:00:00 50 mg via INTRAVENOUS
  Administered 2024-03-19: 09:00:00 150 mg via INTRAVENOUS

## 2024-03-19 MED ORDER — DEXAMETHASONE SODIUM PHOSPHATE 4 MG/ML IJ SOLN
INTRAMUSCULAR | Status: DC | PRN
  Administered 2024-03-19: 09:00:00 5 mg via INTRAVENOUS

## 2024-03-19 MED ORDER — OXYCODONE HCL 5 MG PO TABS: 5.0000 mg | ORAL_TABLET | ORAL | Status: DC | PRN

## 2024-03-19 MED ORDER — SODIUM CHLORIDE 0.9 % IV SOLN: 250.0000 mL | INTRAVENOUS | Status: DC | PRN

## 2024-03-19 MED ORDER — LACTATED RINGERS IV SOLN: INTRAVENOUS | Status: DC

## 2024-03-19 MED ORDER — PHENYLEPHRINE 80 MCG/ML (10ML) SYRINGE FOR IV PUSH (FOR BLOOD PRESSURE SUPPORT)
PREFILLED_SYRINGE | INTRAVENOUS | Status: AC
  Filled 2024-03-19: qty 10

## 2024-03-19 MED ORDER — ACETAMINOPHEN 325 MG PO TABS: 650.0000 mg | ORAL_TABLET | ORAL | Status: DC | PRN

## 2024-03-19 MED ORDER — MIDAZOLAM HCL 2 MG/2ML IJ SOLN
INTRAMUSCULAR | Status: AC
  Filled 2024-03-19: qty 2

## 2024-03-19 MED ORDER — EPHEDRINE SULFATE (PRESSORS) 25 MG/5ML IV SOSY
PREFILLED_SYRINGE | INTRAVENOUS | Status: DC | PRN
  Administered 2024-03-19 (×2): 10 mg via INTRAVENOUS

## 2024-03-19 MED ADMIN — Ondansetron HCl Inj 4 MG/2ML (2 MG/ML): 4 mg | INTRAVENOUS | @ 09:00:00 | NDC 60505613005

## 2024-03-19 MED ADMIN — Acetaminophen Tab 500 MG: 1000 mg | ORAL | @ 08:00:00 | NDC 50580045711

## 2024-03-19 MED FILL — Lidocaine HCl Local Soln Prefilled Syringe 100 MG/5ML (2%): INTRAMUSCULAR | Qty: 5 | Status: AC

## 2024-03-19 MED FILL — Succinylcholine Chloride Sol Pref Syr 200 MG/10ML (20 MG/ML): INTRAVENOUS | Qty: 10 | Status: AC

## 2024-03-19 SURGICAL SUPPLY — 58 items
BAG DECANTER FOR FLEXI CONT (MISCELLANEOUS) ×1 IMPLANT
BINDER BREAST LRG (GAUZE/BANDAGES/DRESSINGS) IMPLANT
BINDER BREAST MEDIUM (GAUZE/BANDAGES/DRESSINGS) IMPLANT
BINDER BREAST XLRG (GAUZE/BANDAGES/DRESSINGS) IMPLANT
BINDER BREAST XXLRG (GAUZE/BANDAGES/DRESSINGS) IMPLANT
BIOPATCH RED 1 DISK 7.0 (GAUZE/BANDAGES/DRESSINGS) IMPLANT
BLADE HEX COATED 2.75 (ELECTRODE) ×1 IMPLANT
BLADE SURG 15 STRL LF DISP TIS (BLADE) ×1 IMPLANT
BNDG GAUZE DERMACEA FLUFF 4 (GAUZE/BANDAGES/DRESSINGS) ×2 IMPLANT
CANISTER SUCT 1200ML W/VALVE (MISCELLANEOUS) ×1 IMPLANT
COVER BACK TABLE 60X90IN (DRAPES) ×1 IMPLANT
COVER MAYO STAND STRL (DRAPES) ×1 IMPLANT
DERMABOND ADVANCED .7 DNX12 (GAUZE/BANDAGES/DRESSINGS) IMPLANT
DRAPE LAPAROSCOPIC ABDOMINAL (DRAPES) ×1 IMPLANT
DRESSING MEPILEX FLEX 4X4 (GAUZE/BANDAGES/DRESSINGS) IMPLANT
ELECT BLADE 6.5 EXT (BLADE) IMPLANT
ELECTRODE BLDE 4.0 EZ CLN MEGD (MISCELLANEOUS) ×1 IMPLANT
ELECTRODE REM PT RTRN 9FT ADLT (ELECTROSURGICAL) ×1 IMPLANT
FUNNEL KELLER 2 DISP (MISCELLANEOUS) IMPLANT
GAUZE PAD ABD 8X10 STRL (GAUZE/BANDAGES/DRESSINGS) ×2 IMPLANT
GAUZE SPONGE 4X4 12PLY STRL LF (GAUZE/BANDAGES/DRESSINGS) IMPLANT
GLOVE BIO SURGEON STRL SZ 6.5 (GLOVE) ×2 IMPLANT
GLOVE BIOGEL PI IND STRL 7.0 (GLOVE) IMPLANT
GOWN STRL REUS W/ TWL LRG LVL3 (GOWN DISPOSABLE) ×3 IMPLANT
IMPL GEL HP 175CC (Breast) IMPLANT
IV NS 1000ML BAXH (IV SOLUTION) IMPLANT
IV NS 500ML BAXH (IV SOLUTION) ×1 IMPLANT
KIT FILL ASEPTIC TRANSFER (MISCELLANEOUS) IMPLANT
NDL HYPO 25X1 1.5 SAFETY (NEEDLE) IMPLANT
NDL SPNL 18GX3.5 QUINCKE PK (NEEDLE) IMPLANT
NEEDLE HYPO 25X1 1.5 SAFETY (NEEDLE) IMPLANT
NEEDLE SPNL 18GX3.5 QUINCKE PK (NEEDLE) IMPLANT
PACK BASIN DAY SURGERY FS (CUSTOM PROCEDURE TRAY) ×1 IMPLANT
PENCIL SMOKE EVACUATOR (MISCELLANEOUS) ×1 IMPLANT
SIZER BRST REUSE 150CC (SIZER) IMPLANT
SIZER BRST REUSE 200CC (SIZER) IMPLANT
SLEEVE SCD COMPRESS KNEE MED (STOCKING) ×1 IMPLANT
SOL PREP POV-IOD 4OZ 10% (MISCELLANEOUS) IMPLANT
SOLN 0.9% NACL POUR BTL 1000ML (IV SOLUTION) IMPLANT
SPIKE FLUID TRANSFER (MISCELLANEOUS) IMPLANT
SPONGE T-LAP 18X18 ~~LOC~~+RFID (SPONGE) ×2 IMPLANT
STRIP SUTURE WOUND CLOSURE 1/2 (MISCELLANEOUS) IMPLANT
SUT MNCRL AB 4-0 PS2 18 (SUTURE) IMPLANT
SUT MON AB 3-0 SH27 (SUTURE) IMPLANT
SUT MON AB 5-0 PS2 18 (SUTURE) ×1 IMPLANT
SUT PDS AB 2-0 CT2 27 (SUTURE) IMPLANT
SUT PDS II 3-0 CT2 27 ABS (SUTURE) IMPLANT
SUT SILK 3 0 PS 1 (SUTURE) IMPLANT
SUT VIC AB 3-0 SH 27X BRD (SUTURE) IMPLANT
SUT VIC AB 4-0 PS2 18 (SUTURE) IMPLANT
SYR 50ML LL SCALE MARK (SYRINGE) IMPLANT
SYR BULB IRRIG 60ML STRL (SYRINGE) ×1 IMPLANT
SYR CONTROL 10ML LL (SYRINGE) IMPLANT
TOWEL GREEN STERILE FF (TOWEL DISPOSABLE) ×1 IMPLANT
TRAY DSU PREP LF (CUSTOM PROCEDURE TRAY) ×1 IMPLANT
TUBE CONNECTING 20X1/4 (TUBING) ×1 IMPLANT
UNDERPAD 30X36 HEAVY ABSORB (UNDERPADS AND DIAPERS) ×2 IMPLANT
YANKAUER SUCT BULB TIP NO VENT (SUCTIONS) ×1 IMPLANT

## 2024-03-19 NOTE — Discharge Instructions (Addendum)
 INSTRUCTIONS FOR AFTER BREAST SURGERY   You will likely have some questions about what to expect following your operation.  The following information will help you and your family understand what to expect when you are discharged from the hospital.  It is important to follow these guidelines to help ensure a smooth recovery and reduce complication.  Postoperative instructions include information on: diet, wound care, medications and physical activity.  AFTER SURGERY Expect to go home after the procedure.  In some cases, you may need to spend one night in the hospital for observation.  DIET Breast surgery does not require a specific diet.  However, the healthier you eat the better your body will heal. It is important to increasing your protein intake.  This means limiting the foods with sugar and carbohydrates.  Focus on vegetables and some meat.  If you have liposuction during your procedure be sure to drink water.  If your urine is bright yellow, then it is concentrated, and you need to drink more water.  As a general rule after surgery, you should have 8 ounces of water every hour while awake.  If you find you are persistently nauseated or unable to take in liquids let us  know.  NO TOBACCO USE or EXPOSURE.  This will slow your healing process and lead to a wound.  WOUND CARE Leave the binder on for 3 days . Use fragrance free soap like Dial, Dove or Ivory.   After 3 days you can remove the binder to shower. Once dry apply binder or sports bra. If you have liposuction you will have a soft and spongy dressing (Lipofoam) that helps prevent creases in your skin.  Remove before you shower and then replace it.  It is also available on [employer]. If you have steri-strips / tape directly attached to your skin leave them in place. It is OK to get these wet.   No baths, pools or hot tubs for four weeks. We close your incision to leave the smallest and best-looking scar. No ointment or creams on your incisions  for four weeks.  No Neosporin (Too many skin reactions).  A few weeks after surgery you can use Mederma and start massaging the scar. We ask you to wear your binder or sports bra for the first 6 weeks around the clock, including while sleeping. This provides added comfort and helps reduce the fluid accumulation at the surgery site. NO Ice or heating pads to the operative site.  You have a very high risk of a BURN before you feel the temperature change.  ACTIVITY No heavy lifting until cleared by the doctor.  This usually means no more than a half-gallon of milk.  It is OK to walk and climb stairs. Moving your legs is very important to decrease your risk of a blood clot.  It will also help keep you from getting deconditioned.  Every 1 to 2 hours get up and walk for 5 minutes. This will help with a quicker recovery back to normal.  Let pain be your guide so you don't do too much.  This time is for you to recover.  You will be more comfortable if you sleep and rest with your head elevated either with a few pillows under you or in a recliner.  No stomach sleeping for a three months.  WORK Everyone returns to work at different times. As a rough guide, most people take at least 1 - 2 weeks off prior to returning to work. If  you need documentation for your job, give the forms to the front staff at the clinic.  DRIVING Arrange for someone to bring you home from the hospital after your surgery.  You may be able to drive a few days after surgery but not while taking any narcotics or valium .  BOWEL MOVEMENTS Constipation can occur after anesthesia and while taking pain medication.  It is important to stay ahead for your comfort.  We recommend taking Milk of Magnesia (2 tablespoons; twice a day) while taking the pain pills.  MEDICATIONS You may be prescribed should start after surgery At your preoperative visit for you history and physical you were given the following medications: Antibiotic: Start this  medication when you get home and take according to the instructions on the bottle. Zofran  4 mg:  This is to treat nausea and vomiting.  You can take this every 6 hours as needed and only if needed. Oxycodone  5 mg every 6 hours for 3 - 5 days.  This is to be used after you have taken the Motrin or the Tylenol . 4.   Gabapentin  300 mg every 12 hours for 7 days.  Over the counter Medication to take: Ibuprofen (Motrin) 400 - 600 mg every 6 hour for 7 days Tylenol  500 mg every 6 hours for 7 days.  Only take the Oxycodone  after you have tried these two. MiraLAX  or stool softener of choice: Take this according to the bottle if you take the Norco.  If muscle work done:  Flexeril 5 mg every 12 hours for 7 days.  WHEN TO CALL Call your surgeon's office if any of the following occur: Fever 101 degrees F or greater Excessive bleeding or fluid from the incision site. Pain that increases over time without aid from the medications Redness, warmth, or pus draining from incision sites Persistent nausea or inability to take in liquids Severe misshapen area that underwent the operation.  Post Anesthesia Home Care Instructions  Activity: Get plenty of rest for the remainder of the day. A responsible individual must stay with you for 24 hours following the procedure.  For the next 24 hours, DO NOT: -Drive a car -Advertising copywriter -Drink alcoholic beverages -Take any medication unless instructed by your physician -Make any legal decisions or sign important papers.  Meals: Start with liquid foods such as gelatin or soup. Progress to regular foods as tolerated. Avoid greasy, spicy, heavy foods. If nausea and/or vomiting occur, drink only clear liquids until the nausea and/or vomiting subsides. Call your physician if vomiting continues.  Special Instructions/Symptoms: Your throat may feel dry or sore from the anesthesia or the breathing tube placed in your throat during surgery. If this causes discomfort,  gargle with warm salt water. The discomfort should disappear within 24 hours.  If you had a scopolamine  patch placed behind your ear for the management of post- operative nausea and/or vomiting:  1. The medication in the patch is effective for 72 hours, after which it should be removed.  Wrap patch in a tissue and discard in the trash. Wash hands thoroughly with soap and water. 2. You may remove the patch earlier than 72 hours if you experience unpleasant side effects which may include dry mouth, dizziness or visual disturbances. 3. Avoid touching the patch. Wash your hands with soap and water after contact with the patch.     No Tylenol  until after 1:45pm today if needed

## 2024-03-19 NOTE — Anesthesia Postprocedure Evaluation (Signed)
 Anesthesia Post Note  Patient: Stephanie Brewer  Procedure(s) Performed: INSERTION, IMPLANT, BREAST (Bilateral: Breast)     Patient location during evaluation: PACU Anesthesia Type: General Level of consciousness: awake Pain management: pain level controlled Vital Signs Assessment: post-procedure vital signs reviewed and stable Respiratory status: spontaneous breathing, nonlabored ventilation and respiratory function stable Cardiovascular status: blood pressure returned to baseline and stable Postop Assessment: no apparent nausea or vomiting Anesthetic complications: no   No notable events documented.  Last Vitals:  Vitals:   03/19/24 1030 03/19/24 1118  BP: 128/76 135/68  Pulse: 70   Resp: 12   Temp:  (!) 36.2 C  SpO2: 98% 99%    Last Pain:  Vitals:   03/19/24 1118  PainSc: 0-No pain                 Delon Aisha Arch

## 2024-03-19 NOTE — Transfer of Care (Signed)
 Immediate Anesthesia Transfer of Care Note  Patient: Stephanie Brewer  Procedure(s) Performed: INSERTION, IMPLANT, BREAST (Bilateral: Breast)  Patient Location: PACU  Anesthesia Type:General  Level of Consciousness: sedated  Airway & Oxygen Therapy: Patient Spontanous Breathing and Patient connected to face mask oxygen  Post-op Assessment: Report given to RN and Post -op Vital signs reviewed and stable  Post vital signs: Reviewed and stable  Last Vitals:  Vitals Value Taken Time  BP    Temp    Pulse 101 03/19/24 09:47  Resp 16 03/19/24 09:47  SpO2 99 % 03/19/24 09:47  Vitals shown include unfiled device data.  Last Pain:  Vitals:   03/19/24 0742  PainSc: 0-No pain         Complications: No notable events documented.

## 2024-03-19 NOTE — Interval H&P Note (Signed)
 History and Physical Interval Note:  03/19/2024 8:06 AM  Stephanie Brewer  has presented today for surgery, with the diagnosis of hx of breast cancer.  The various methods of treatment have been discussed with the patient and family. After consideration of risks, benefits and other options for treatment, the patient has consented to  Procedures with comments: INSERTION, IMPLANT, BREAST (Bilateral) - Bilateral breast implant placement for reconstruction after cancer as a surgical intervention.  The patient's history has been reviewed, patient examined, no change in status, stable for surgery.  I have reviewed the patient's chart and labs.  Questions were answered to the patient's satisfaction.     Stephanie Brewer

## 2024-03-19 NOTE — Op Note (Signed)
 Op report  DATE OF OPERATION: 03/19/2024  LOCATION: [facility]  SURGICAL DIVISION: Plastic Surgery  PREOPERATIVE DIAGNOSIS:  1.History of breast cancer.  2. Breast asymmetry.   POSTOPERATIVE DIAGNOSIS:  Same as preoperative diagnosis.   PROCEDURE:  1. Bilateral capsule release 200 cm2 each side. 2. Placement of bilateral breast implants.  SURGEON: Estefana Fritter, DO  ANESTHESIA:  General.   COMPLICATIONS: None.   IMPLANTS: Left - Mentor Smooth Round High Profile Gel 175 cc.  Right - Mentor Smooth Round High Profile Gel 175 cc.  INDICATIONS FOR PROCEDURE:  The patient, Stephanie Brewer, is a 57 y.o. female born on Sep 03, 1965, is here for treatment after a partial mastectomy.She requires capsulotomies to better position the implants. She had asymmetry and volume loss due to the partial mastectomy MRN: [id number]  CONSENT:  Informed consent was obtained directly from the patient. Risks, benefits and alternatives were fully discussed. Specific risks including but not limited to bleeding, infection, hematoma, seroma, scarring, pain, implant infection, implant extrusion, capsular contracture, asymmetry, wound healing problems, and need for further surgery were all discussed. The patient did have an ample opportunity to have her questions answered to her satisfaction.   DESCRIPTION OF PROCEDURE:  The patient was taken to the operating room. SCDs were placed and IV antibiotics were given. The patient's chest was prepped and draped in a sterile fashion. A time out was performed and the implants to be used were identified.    On the right breast: Local with epinephrine  was used to infiltrate at the incision site. The previous scar was incised at the inframammary fold laterally. The bovie was used to dissect to the previous cavity of the implant.  This was released with capsulotomies.  The pocket was irrigated with vashe.  Experel was injected.  Measurements were  made and a sizer used to confirm adequate pocket size for the implant dimensions. The 150 cc was slightly small and the 200 cc too big.  Therefore the 175 cc implant was selected. Hemostasis was ensured with electrocautery. New gloves were placed. The implant was soaked in vashe solution and then placed in the pocket with the keller funnel and oriented appropriately. The capsule was closed with the 3-0 PDS.  The 3-0 Monocryl suture and 4-0 Monocryl was used to close the skin.    On the left breast: Local with epinephrine  was used to infiltrate at the incision site. The previous scar was incised at the inframammary fold laterally. The bovie was used to dissect to the previous cavity of the implant.  This was released with capsulotomies.  The pocket was irrigated with vashe.  Experel was injected.  Measurements were made and a sizer used to confirm adequate pocket size for the implant dimensions. The 150 cc was slightly small and the 200 cc too big.  Therefore the 175 cc implant was selected. Hemostasis was ensured with electrocautery. New gloves were placed. The implant was soaked in vashe solution and then placed in the pocket with the keller funnel and oriented appropriately. The capsule was closed with the 3-0 PDS.  The 3-0 Monocryl suture and 4-0 Monocryl was used to close the skin.  Dermabond was applied to the incision site. A breast binder and ABDs were placed.  The patient was allowed to wake from anesthesia and taken to the recovery room in satisfactory condition.

## 2024-03-19 NOTE — Progress Notes (Addendum)
 Second attempt made to Dr. Lowery but she was no longer in the building. Dr Lowery  stated it was ok for patient to be discharged home and that the implants will be higher at this time. Instructed RN that it is okay for the patient to push implants down. Surgeon states that she does not need to assess patient at this time. Pt DC home per surgeon instructions/orders.

## 2024-03-19 NOTE — Anesthesia Preprocedure Evaluation (Addendum)
 Anesthesia Evaluation  Patient identified by MRN, date of birth, ID band Patient awake    Reviewed: Allergy & Precautions, NPO status , Patient's Chart, lab work & pertinent test results  History of Anesthesia Complications Negative for: history of anesthetic complications  Airway Mallampati: II  TM Distance: >3 FB Neck ROM: Full   Comment: Previous grade I view with Miller 2, easy mask Dental  (+) Dental Advisory Given   Pulmonary neg pulmonary ROS   Pulmonary exam normal breath sounds clear to auscultation       Cardiovascular (-) hypertension(-) angina + DVT (LLE)  (-) Past MI, (-) Cardiac Stents and (-) CABG (-) dysrhythmias  Rhythm:Regular Rate:Normal  HLD  TTE 05/31/2020: IMPRESSIONS    1. Left ventricular ejection fraction, by estimation, is 65 to 70%. The  left ventricle has normal function. The left ventricle has no regional  wall motion abnormalities. Left ventricular diastolic parameters were  normal. The average left ventricular  global longitudinal strain is -25.8 %. The global longitudinal strain is  normal.   2. Right ventricular systolic function is normal. The right ventricular  size is normal. There is normal pulmonary artery systolic pressure. The  estimated right ventricular systolic pressure is 19.5 mmHg.   3. The mitral valve was not well visualized. Trivial mitral valve  regurgitation.   4. The aortic valve was not well visualized. Aortic valve regurgitation  is not visualized.   5. The inferior vena cava is normal in size with greater than 50%  respiratory variability, suggesting right atrial pressure of 3 mmHg     Neuro/Psych  Headaches, neg Seizures PSYCHIATRIC DISORDERS Anxiety        GI/Hepatic Neg liver ROS,GERD  ,,Diverticulosis    Endo/Other  Pre-diabetes  Renal/GU negative Renal ROS     Musculoskeletal Osteoporosis    Abdominal   Peds  Hematology negative hematology ROS (+)  Lab Results      Component                Value               Date                      WBC                      3.8 (L)             02/10/2024                HGB                      12.1                02/10/2024                HCT                      36.9                02/10/2024                MCV                      88.9                02/10/2024                PLT  221                 02/10/2024              Anesthesia Other Findings Last Xarelto : 03/15/2024  Reproductive/Obstetrics Right breast cancer                              Anesthesia Physical Anesthesia Plan  ASA: 2  Anesthesia Plan: General   Post-op Pain Management: Tylenol  PO (pre-op)*   Induction: Intravenous  PONV Risk Score and Plan: 3 and Ondansetron , Dexamethasone , Midazolam  and Treatment may vary due to age or medical condition  Airway Management Planned: LMA  Additional Equipment:   Intra-op Plan:   Post-operative Plan: Extubation in OR  Informed Consent: I have reviewed the patients History and Physical, chart, labs and discussed the procedure including the risks, benefits and alternatives for the proposed anesthesia with the patient or authorized representative who has indicated his/her understanding and acceptance.     Dental advisory given  Plan Discussed with: CRNA and Anesthesiologist  Anesthesia Plan Comments: (Risks of general anesthesia discussed including, but not limited to, sore throat, hoarse voice, chipped/damaged teeth, injury to vocal cords, nausea and vomiting, allergic reactions, lung infection, heart attack, stroke, and death. All questions answered. )         Anesthesia Quick Evaluation

## 2024-03-19 NOTE — Progress Notes (Signed)
 Called into the OR spoke to Gordon RN who made Dr. Lowery aware patient's breasts appeared misshapen upon phase 2 assessment with Kreg, CHARITY FUNDRAISER. Surgeon to come by and assess patient once out of the OR. Will continue to monitor.

## 2024-03-19 NOTE — Anesthesia Procedure Notes (Signed)
 Procedure Name: LMA Insertion Date/Time: 03/19/2024 8:40 AM  Performed by: Emilio Rock BIRCH, CRNAPre-anesthesia Checklist: Patient identified, Emergency Drugs available, Suction available and Patient being monitored Patient Re-evaluated:Patient Re-evaluated prior to induction Oxygen Delivery Method: Circle System Utilized Preoxygenation: Pre-oxygenation with 100% oxygen Induction Type: IV induction Ventilation: Mask ventilation without difficulty LMA: LMA inserted LMA Size: 4.0 Number of attempts: 1 Airway Equipment and Method: bite block Placement Confirmation: positive ETCO2 Tube secured with: Tape Dental Injury: Teeth and Oropharynx as per pre-operative assessment

## 2024-03-20 ENCOUNTER — Encounter (HOSPITAL_BASED_OUTPATIENT_CLINIC_OR_DEPARTMENT_OTHER): Payer: Self-pay | Admitting: Plastic Surgery

## 2024-03-23 ENCOUNTER — Encounter: Payer: Self-pay | Admitting: Hematology and Oncology

## 2024-03-25 ENCOUNTER — Ambulatory Visit: Admitting: Physician Assistant

## 2024-03-25 VITALS — BP 124/79 | HR 82 | Ht 64.0 in | Wt 137.8 lb

## 2024-03-25 DIAGNOSIS — C50411 Malignant neoplasm of upper-outer quadrant of right female breast: Secondary | ICD-10-CM

## 2024-03-25 DIAGNOSIS — Z170 Estrogen receptor positive status [ER+]: Secondary | ICD-10-CM

## 2024-03-25 NOTE — Progress Notes (Signed)
 Patient is a pleasant 58 year old female with history of breast cancer now s/p bilateral capsular release with placement of 175 cc smooth round high-profile gel implants performed 03/19/2024 by Dr. Lowery who presents to clinic for postoperative follow-up.  Today, patient is doing well overall.  She denies any chest pain, difficulty breathing, leg swelling, fevers, or other concerns.  She states that she is eating and drinking well, voiding urine and BM.  Ambulatory at home.  Her only concern is she feels as though her implant placement is particularly high and wanted to ensure that it was not unusual.  She has not had to take anything for discomfort aside from occasional Tylenol .  On exam, there is symmetric superior pole fullness bilaterally where her implants are placed.  The skin is not taut.  There is no palpable underlying fluid collection concerning for seroma or hematoma.  There is no redness, ecchymoses, or other overlying skin changes.  The bordered Mepilex dressings are without any saturation suggesting incisional wounds or drainage.  Patient understands that these implants will likely settle with time, but can benefit from gently massaging the implants inferiorly.  Called and discussed with Dr. Lowery who agrees with this plan.  She states that it is okay if they are pushed down a little bit and it will not hurt her reconstruction.  Follow-up scheduled. Picture(s) obtained of the patient and placed in the chart were with the patient's or guardian's permission.

## 2024-04-07 ENCOUNTER — Encounter: Payer: Self-pay | Admitting: Plastic Surgery

## 2024-04-07 ENCOUNTER — Ambulatory Visit (INDEPENDENT_AMBULATORY_CARE_PROVIDER_SITE_OTHER): Admitting: Plastic Surgery

## 2024-04-07 VITALS — BP 117/77 | HR 75 | Ht 64.0 in | Wt 141.0 lb

## 2024-04-07 DIAGNOSIS — Z170 Estrogen receptor positive status [ER+]: Secondary | ICD-10-CM

## 2024-04-07 DIAGNOSIS — C50411 Malignant neoplasm of upper-outer quadrant of right female breast: Secondary | ICD-10-CM

## 2024-04-07 NOTE — Progress Notes (Signed)
" ° °  Subjective:    Patient ID: Stephanie Brewer, female    DOB: 1965-04-24, 59 y.o.   MRN: [id number]  The patient is a 59 year old female here for follow-up after surgery on December 18.  She was originally treated for breast cancer in 2021 that was on the right breast.  She had a partial right mastectomy with radiation.  She had implants prior to the diagnosis.  They were reported as 450 cc.  She underwent a left mastopexy August 2021.  She is also had abdominoplasty and fat grafting.  She wanted improved symmetry.  So December 18 we did placement of Mentor smooth round high-profile gel 175 cc implants.  The implants are riding a little bit high especially on the right.  She has been doing some massage but it is tender.      Review of Systems  Constitutional: Negative.   Eyes: Negative.   Respiratory: Negative.    Endocrine: Negative.        Objective:   Physical Exam Constitutional:      Appearance: Normal appearance.  Cardiovascular:     Rate and Rhythm: Normal rate.     Pulses: Normal pulses.  Neurological:     Mental Status: She is alert and oriented to person, place, and time.  Psychiatric:        Mood and Affect: Mood normal.        Behavior: Behavior normal.        Thought Content: Thought content normal.        Judgment: Judgment normal.           Assessment & Plan:     ICD-10-CM   1. Malignant neoplasm of upper-outer quadrant of right breast in female, estrogen receptor positive (HCC)  C50.411    Z17.0        Continue with massage to encourage inferior positioning of the implants.  We talked about using binder that would help with this.  If this does not improve then we may have to make some adjustments either in the OR or here in the office. Follow up in 2 weeks.  "

## 2024-04-22 ENCOUNTER — Other Ambulatory Visit: Payer: Self-pay | Admitting: Hematology and Oncology

## 2024-04-24 ENCOUNTER — Ambulatory Visit: Admitting: Student

## 2024-04-24 VITALS — BP 117/78 | HR 75

## 2024-04-24 DIAGNOSIS — C773 Secondary and unspecified malignant neoplasm of axilla and upper limb lymph nodes: Secondary | ICD-10-CM

## 2024-04-24 DIAGNOSIS — Z170 Estrogen receptor positive status [ER+]: Secondary | ICD-10-CM

## 2024-04-24 DIAGNOSIS — C50911 Malignant neoplasm of unspecified site of right female breast: Secondary | ICD-10-CM

## 2024-04-24 DIAGNOSIS — C50411 Malignant neoplasm of upper-outer quadrant of right female breast: Secondary | ICD-10-CM

## 2024-04-24 DIAGNOSIS — Z9889 Other specified postprocedural states: Secondary | ICD-10-CM

## 2024-04-24 NOTE — Progress Notes (Signed)
 Patient is a 60 year old female who recently underwent bilateral capsular release and placement of bilateral implants with Dr. Lowery in 03/19/2024.  She is 5 weeks postop.  She presents to the clinic today for postoperative follow-up.  Patient was most recently seen in the clinic on 04/07/2024.  At this visit, the implants are little bit high, especially on the right.  Patient was doing some massage but it was mildly tender.  Plan was for patient to continue with the massage and continue with her binder.  Today, patient reports she is doing okay.  She states that the right side is still sitting fairly high and she has some tenderness to the area.  She states that her range of motion is affected on the right side as well.  She states that she has been massaging and that the left side has been dropping a bit.  She does not report any fevers or chills.  She denies any other issues or concerns at this time.  Chaperone present on exam.  On exam, patient is sitting upright in no acute distress.  Implant on the right side is sitting higher than the left.  There is no overlying erythema to either side, no obvious fluid collections.  There is mild tenderness to palpation to the lateral and superior aspects of the right breast.  There are no skin changes.  Incisions appear to be well-healed.  There are no signs of infection.  Discussed with the patient to continue to massage the implant to help facilitate it settle out.  I did recommend though that patient start physical therapy to help with some of the pain as well as her range of motion.  She was agreeable to this.  Discussed with the patient that she may begin applying scar creams if she would like to her incisions.  Discussed with the patient that she may transition out of her compression bra as of next week and she may start increasing her activities next week.  She expressed understanding.  Patient to follow back up in a few weeks with Dr.  Lowery.  Instructed her to call in the meantime with any questions or concerns.  Pictures were obtained of the patient and placed in the chart with the patient's or guardian's permission.

## 2024-04-30 ENCOUNTER — Ambulatory Visit: Attending: Student | Admitting: Physical Therapy

## 2024-04-30 ENCOUNTER — Other Ambulatory Visit: Payer: Self-pay

## 2024-04-30 ENCOUNTER — Encounter: Payer: Self-pay | Admitting: Physical Therapy

## 2024-04-30 DIAGNOSIS — R600 Localized edema: Secondary | ICD-10-CM | POA: Diagnosis present

## 2024-04-30 DIAGNOSIS — C773 Secondary and unspecified malignant neoplasm of axilla and upper limb lymph nodes: Secondary | ICD-10-CM | POA: Insufficient documentation

## 2024-04-30 DIAGNOSIS — M25611 Stiffness of right shoulder, not elsewhere classified: Secondary | ICD-10-CM | POA: Diagnosis present

## 2024-04-30 DIAGNOSIS — C50411 Malignant neoplasm of upper-outer quadrant of right female breast: Secondary | ICD-10-CM | POA: Diagnosis present

## 2024-04-30 DIAGNOSIS — Z483 Aftercare following surgery for neoplasm: Secondary | ICD-10-CM | POA: Diagnosis present

## 2024-04-30 DIAGNOSIS — C50911 Malignant neoplasm of unspecified site of right female breast: Secondary | ICD-10-CM | POA: Insufficient documentation

## 2024-04-30 DIAGNOSIS — Z170 Estrogen receptor positive status [ER+]: Secondary | ICD-10-CM | POA: Insufficient documentation

## 2024-04-30 NOTE — Therapy (Signed)
 " OUTPATIENT PHYSICAL THERAPY  UPPER EXTREMITY ONCOLOGY EVALUATION  Patient Name: Stephanie Brewer MRN: [id number] DOB:14-Jun-1965, 59 y.o., female Today's Date: 04/30/2024  END OF SESSION:  PT End of Session - 04/30/24 1150     Visit Number 1    Number of Visits 9    Date for Recertification  05/28/24    PT Start Time 1104    PT Stop Time 1149    PT Time Calculation (min) 45 min    Activity Tolerance Patient tolerated treatment well    Behavior During Therapy [facility] for tasks assessed/performed          Past Medical History:  Diagnosis Date   Anemia    Anxiety    Breast cancer (HCC)    right breast   Clotting disorder    Displacement of breast implant 12/24/2017   Diverticulitis    Diverticulosis    DVT (deep venous thrombosis) (HCC)    left leg knee and ankle    GERD (gastroesophageal reflux disease)    History of kidney stones    Kidney stones    Migraines    Osteoporosis    Pneumonia    Pre-diabetes    Sigmoid diverticulitis 08/17/2022   Past Surgical History:  Procedure Laterality Date   BREAST BIOPSY Left 08/15/2021   BREAST ENHANCEMENT SURGERY     Augmentation 2003   BREAST IMPLANT REMOVAL Bilateral 01/14/2020   Procedure: REMOVAL BREAST IMPLANTS;  Surgeon: Elisabeth Craig RAMAN, MD;  Location: [facility];  Service: Plastics;  Laterality: Bilateral;   BREAST LUMPECTOMY Right 01/2020   BREAST LUMPECTOMY WITH RADIOACTIVE SEED AND SENTINEL LYMPH NODE BIOPSY Right 01/14/2020   Procedure: Right breast seed localized lumpectomy with sentinel lymph node biopsy;  Surgeon: Ebbie Cough, MD;  Location: [facility];  Service: General;  Laterality: Right;   BUNIONECTOMY Bilateral 2005   Right foot in 2006   COLON RESECTION  08/2022   LIPOSUCTION WITH LIPOFILLING Bilateral 11/29/2020   Procedure: LIPOSUCTION WITH LIPOFILLING FROM ABDOMEN TO BILATERAL BREASTS;  Surgeon: Elisabeth Craig RAMAN, MD;  Location: [facility];  Service:  Plastics;  Laterality: Bilateral;   MASTOPEXY Left 11/29/2020   Procedure: LEFT MASTOPEXY;  Surgeon: Elisabeth Craig RAMAN, MD;  Location: [facility];  Service: Plastics;  Laterality: Left;   PLACEMENT OF BREAST IMPLANTS Bilateral 03/19/2024   Procedure: INSERTION, IMPLANT, BREAST;  Surgeon: Lowery Estefana RAMAN, DO;  Location: [facility];  Service: Plastics;  Laterality: Bilateral;  Bilateral breast implant placement for reconstruction after cancer   PORTACATH PLACEMENT N/A 09/01/2019   Procedure: INSERTION PORT-A-CATH WITH ULTRASOUND GUIDANCE;  Surgeon: Ebbie Cough, MD;  Location: [facility];  Service: General;  Laterality: N/A;   PROCTOSCOPY N/A 08/17/2022   Procedure: RIGID PROCTOSCOPY;  Surgeon: Sheldon Standing, MD;  Location: [facility];  Service: General;  Laterality: N/A;   TRIGGER FINGER RELEASE Left 05/09/2020   Procedure: RELEASE TRIGGER FINGER/A-1 PULLEY LEFT LONG;  Surgeon: Murrell Drivers, MD;  Location: [facility];  Service: Orthopedics;  Laterality: Left;   TRIGGER FINGER RELEASE Right 08/02/2023   Procedure: RIGHT THUMB AND RIGHT LONG FINGER A1 PULLEY TRIGGER RELEASE;  Surgeon: Murrell Drivers, MD;  [facility];  Service: Orthopedics;  Laterality: Right;  RIGHT THUMB AND RIGHT LONG FINGER TRIGGER RELEASES   UMBILICAL HERNIA REPAIR  2002   Patient Active Problem List   Diagnosis Date Noted   Chronic anticoagulation 07/16/2022   Normocytic  anemia 05/26/2022   History of recurrent deep vein thrombosis (DVT) 05/26/2022   Pre-diabetes 05/23/2022   Pure hypercholesterolemia 05/23/2022   Osteoporosis of femur without pathological fracture 11/15/2021   Overweight (BMI 25.0-29.9) 08/15/2021   Port-A-Cath in place 09/09/2019   Malignant neoplasm of upper-outer quadrant of right breast in female, estrogen receptor positive (HCC) 08/20/2019   Breast cancer metastasized to axillary lymph node, right (HCC) 08/18/2019    Trigger middle finger of left hand 02/10/2019   Radicular leg pain 02/04/2018   H/O bilateral breast implants 12/24/2017   GERD (gastroesophageal reflux disease) 12/06/2015   Gluten intolerance 12/06/2015    PCP: Norene Fielding, DO  REFERRING PROVIDER:  Andris Estefana BRAVO, PA-C  REFERRING DIAG: C50.911,C77.3 (ICD-10-CM) - Breast cancer metastasized to axillary lymph node, right (HCC) C50.411,Z17.0 (ICD-10-CM) - Malignant neoplasm of upper-outer quadrant of right breast in female, estrogen receptor positive (HCC)  THERAPY DIAG:  Stiffness of right shoulder, not elsewhere classified  Localized edema  Aftercare following surgery for neoplasm  Malignant neoplasm of upper-outer quadrant of right breast in female, estrogen receptor positive (HCC)  ONSET DATE: 03/19/24  Rationale for Evaluation and Treatment: Rehabilitation  SUBJECTIVE:                                                                                                                                                                                           SUBJECTIVE STATEMENT: My R implant is up too high and goes up towards my armpit. It causes pain when I move my R arm.   PERTINENT HISTORY: History of R breast cancer. 1.3 cm mass and right axillary node. Biopsy showed grade II IDC that was ER+ , PR-, HER2+. Node positive. She underwent primary chemotherapy followed by right lumpectomy and SLNB 0/4 on 01/15/2020. She completed radiation 02/15/20 and then HER2 therapy. She completed neratinib . She is on an aromatase inhibitor. Hx of LLE DVT following foot surgery for bunion, acute DVT in LUE and in LLE diagnosed on 12/16/20 She had implants placed on 03/19/24.   PAIN:  Are you having pain? Yes NPRS scale: 3/10 Pain location: R axilla Pain orientation: Right  PAIN TYPE: aching and dull Pain description: constant  Aggravating factors: raising arm Relieving factors: nothing  PRECAUTIONS: Other: R UE lymphedema risk,  previous LLE DVT and DVT in LUE  RED FLAGS: None   WEIGHT BEARING RESTRICTIONS: No  FALLS:  Has patient fallen in last 6 months? No  LIVING ENVIRONMENT: Lives with: lives with their spouse Lives in: House/apartment Stairs: Yes; Internal: 14 steps; on right going up Has following equipment at home: None  OCCUPATION: full time, sells insurance  LEISURE: some exercise - prior to surgery did floor exercises and walked 2-3x/wk but in the summer would do more, this week has started dance aerobics  HAND DOMINANCE: left   PRIOR LEVEL OF FUNCTION: Independent  PATIENT GOALS: to try and decrease pain when raising arm   OBJECTIVE: Note: Objective measures were completed at Evaluation unless otherwise noted.  COGNITION: Overall cognitive status: Within functional limits for tasks assessed   PALPATION: Cording visible in R axilla  OBSERVATIONS / OTHER ASSESSMENTS: R implant is sitting much higher than L, is positioned up on chest, cording visible in R axilla, possible skin indentation along lateral breast due to deep cording  POSTURE: forward head, rounded shoulders  UPPER EXTREMITY AROM/PROM:  A/PROM RIGHT   eval   Shoulder extension 51  Shoulder flexion 143- with pulling  Shoulder abduction 138 with pulling  Shoulder internal rotation 66  Shoulder external rotation 90    (Blank rows = not tested)  A/PROM LEFT   eval  Shoulder extension 60  Shoulder flexion 167  Shoulder abduction 176  Shoulder internal rotation 78  Shoulder external rotation 94    (Blank rows = not tested)  UPPER EXTREMITY STRENGTH: 5/5 but with discomfort on R  LYMPHEDEMA ASSESSMENTS:   SURGERY TYPE/DATE: R lumpectomy and SLNB 01/15/20  NUMBER OF LYMPH NODES REMOVED: 0/4  CHEMOTHERAPY: completed neoadjuvant and adjuvant  RADIATION:completed on 02/15/2020  HORMONE TREATMENT: anastrozole  currently  INFECTIONS: none   LYMPHEDEMA ASSESSMENTS:   LANDMARK RIGHT  eval  At axilla  31.6   15 cm proximal to the proximal aspect of the olecranon process 31  10 cm proximal to the proximal aspect of the olecranon process 30.3  Olecranon process 25.2  15 cm proximal to the proximal aspect of the ulnar styloid process 22.5  10 cm proximal to the proximal aspect of the ulnar styloid process 20  Just distal to the ulnar styloid process 15.4  Across hand at thumb web space 17.5  At base of 2nd digit 6  (Blank rows = not tested)  LANDMARK LEFT  eval  At axilla  29.5  15 cm proximal to the proximal aspect of the olecranon process 30  10 cm proximal to the proximal aspect of the olecranon process 30  Olecranon process 25.1  15 cm proximal to the proximal aspect of the ulnar styloid process 23.2  10 cm proximal to  the proximal aspect of the ulnar styloid process 21  Just distal to the ulnar styloid process 15  Across hand at thumb web space 18.5  At base of 2nd digit 6  (Blank rows = not tested)  Chest circumference just inferior to the axillae:  Chest circumference at the largest point:                                                                                                                               TREATMENT DATE:  04/30/24: PROM to R shoulder in  to flexion and abduction while performing MFR to cording in R axilla and briefly to possible cording along R lateral breast    PATIENT EDUCATION:  Education details: axillary cording and how it is managed Person educated: Patient Education method: Explanation Education comprehension: verbalized understanding  HOME EXERCISE PROGRAM: End range stretching for cording  ASSESSMENT:  CLINICAL IMPRESSION: Patient is a 59 y.o. female who was seen today for physical therapy evaluation and treatment for R axillary pain and decreased R shoulder ROM following implant placement on 03/19/24. Pt had a previous lumpectomy and SLNB in 2021. She completed chemo and radiation and is currently taking anastrozole . Her implant on the  R has sat higher since her surgery. She has discomfort with raising her R arm in to flexion and abduction. There is some increased fluid at R upper arm on measurement (L is dominant and should be larger.)Therapist was able to to palpate cording in R axilla today and possible deep cording that extends down lateral trunk and at lateral breast. Began MFR to cording today. She would benefit from skilled PT services to improve shoulder ROM and decrease pain in R axilla secondary to cording.     OBJECTIVE IMPAIRMENTS: decreased knowledge of condition, decreased ROM, decreased strength, increased fascial restrictions, impaired UE functional use, postural dysfunction, and pain.   ACTIVITY LIMITATIONS: carrying and lifting  PARTICIPATION LIMITATIONS: cleaning and laundry  PERSONAL FACTORS: none are also affecting patient's functional outcome.   REHAB POTENTIAL: Good  CLINICAL DECISION MAKING: Stable/uncomplicated  EVALUATION COMPLEXITY: Low  GOALS: Goals reviewed with patient? Yes  SHORT TERM GOALS=LONG TERM GOALS Target date: 05/28/24  Pt will demonstrate 165 degrees of R shoulder flexion to allow her to raise arm overhead. Baseline: Goal status: INITIAL  2.  Pt will demonstrate 165 degrees of R shoulder abduction to allow her to reach out to the side. Baseline:  Goal status: INITIAL  3.  Pt will report a 75% improvement in pain in R axilla to allow improved comfort and function. Baseline:  Goal status: INITIAL  4.  Pt will be independent in a home exercise program for continued stretching and stretching.  Baseline:  Goal status: INITIAL   PLAN:  PT FREQUENCY: 2x/week  PT DURATION: 4 weeks  PLANNED INTERVENTIONS: 02835- PT Re-evaluation, 97750- Physical Performance Testing, 97110-Therapeutic exercises, 97530- Therapeutic activity, 97112- Neuromuscular re-education, 97535- Self Care, 02859- Manual therapy, [phone]- Orthotic Initial, [phone]- Orthotic/Prosthetic subsequent,  Patient/Family education, Balance training, Joint mobilization, Therapeutic exercises, Therapeutic activity, Neuromuscular re-education, Gait training, and Self Care  PLAN FOR NEXT SESSION: pulleys, ball, supine scap, MFR to cording in R axilla and to possible deep cording along lateral trunk and lateral breast, STM to implant  [employer], PT 04/30/2024, 12:00 PM  "

## 2024-05-06 ENCOUNTER — Encounter: Payer: Self-pay | Admitting: Rehabilitation

## 2024-05-06 ENCOUNTER — Ambulatory Visit: Admitting: Rehabilitation

## 2024-05-06 DIAGNOSIS — M25611 Stiffness of right shoulder, not elsewhere classified: Secondary | ICD-10-CM

## 2024-05-06 DIAGNOSIS — C50411 Malignant neoplasm of upper-outer quadrant of right female breast: Secondary | ICD-10-CM

## 2024-05-06 DIAGNOSIS — M6281 Muscle weakness (generalized): Secondary | ICD-10-CM

## 2024-05-06 DIAGNOSIS — Z483 Aftercare following surgery for neoplasm: Secondary | ICD-10-CM

## 2024-05-06 DIAGNOSIS — R600 Localized edema: Secondary | ICD-10-CM

## 2024-05-06 NOTE — Therapy (Signed)
 " OUTPATIENT PHYSICAL THERAPY  UPPER EXTREMITY ONCOLOGY EVALUATION  Patient Name: Stephanie Brewer MRN: [id number] DOB:[birth year], 59 y.o., female Today's Date: 05/06/2024  END OF SESSION:  PT End of Session - 05/06/24 0801     Visit Number 2    Number of Visits 9    Date for Recertification  05/28/24    PT Start Time 0800    PT Stop Time 0849    PT Time Calculation (min) 49 min    Activity Tolerance Patient tolerated treatment well    Behavior During Therapy [facility] for tasks assessed/performed          Past Medical History:  Diagnosis Date   Anemia    Anxiety    Breast cancer (HCC)    right breast   Clotting disorder    Displacement of breast implant 12/24/2017   Diverticulitis    Diverticulosis    DVT (deep venous thrombosis) (HCC)    left leg knee and ankle    GERD (gastroesophageal reflux disease)    History of kidney stones    Kidney stones    Migraines    Osteoporosis    Pneumonia    Pre-diabetes    Sigmoid diverticulitis 08/17/2022   Past Surgical History:  Procedure Laterality Date   BREAST BIOPSY Left 08/15/2021   BREAST ENHANCEMENT SURGERY     Augmentation 2003   BREAST IMPLANT REMOVAL Bilateral 01/14/2020   Procedure: REMOVAL BREAST IMPLANTS;  Surgeon: Elisabeth Craig RAMAN, MD;  Location: [facility];  Service: Plastics;  Laterality: Bilateral;   BREAST LUMPECTOMY Right 01/2020   BREAST LUMPECTOMY WITH RADIOACTIVE SEED AND SENTINEL LYMPH NODE BIOPSY Right 01/14/2020   Procedure: Right breast seed localized lumpectomy with sentinel lymph node biopsy;  Surgeon: Ebbie Cough, MD;  Location: [facility];  Service: General;  Laterality: Right;   BUNIONECTOMY Bilateral 2005   Right foot in 2006   COLON RESECTION  08/2022   LIPOSUCTION WITH LIPOFILLING Bilateral 11/29/2020   Procedure: LIPOSUCTION WITH LIPOFILLING FROM ABDOMEN TO BILATERAL BREASTS;  Surgeon: Elisabeth Craig RAMAN, MD;  Location: [facility];  Service:  Plastics;  Laterality: Bilateral;   MASTOPEXY Left 11/29/2020   Procedure: LEFT MASTOPEXY;  Surgeon: Elisabeth Craig RAMAN, MD;  Location: [facility];  Service: Plastics;  Laterality: Left;   PLACEMENT OF BREAST IMPLANTS Bilateral 03/19/2024   Procedure: INSERTION, IMPLANT, BREAST;  Surgeon: Lowery Estefana RAMAN, DO;  Location: [facility];  Service: Plastics;  Laterality: Bilateral;  Bilateral breast implant placement for reconstruction after cancer   PORTACATH PLACEMENT N/A 09/01/2019   Procedure: INSERTION PORT-A-CATH WITH ULTRASOUND GUIDANCE;  Surgeon: Ebbie Cough, MD;  Location: [facility];  Service: General;  Laterality: N/A;   PROCTOSCOPY N/A 08/17/2022   Procedure: RIGID PROCTOSCOPY;  Surgeon: Sheldon Standing, MD;  Location: [facility];  Service: General;  Laterality: N/A;   TRIGGER FINGER RELEASE Left 05/09/2020   Procedure: RELEASE TRIGGER FINGER/A-1 PULLEY LEFT LONG;  Surgeon: Murrell Drivers, MD;  Location: [facility];  Service: Orthopedics;  Laterality: Left;   TRIGGER FINGER RELEASE Right 08/02/2023   Procedure: RIGHT THUMB AND RIGHT LONG FINGER A1 PULLEY TRIGGER RELEASE;  Surgeon: Murrell Drivers, MD;  Location: [facility];  Service: Orthopedics;  Laterality: Right;  RIGHT THUMB AND RIGHT LONG FINGER TRIGGER RELEASES   UMBILICAL HERNIA REPAIR  2002   Patient Active Problem List   Diagnosis Date Noted   Chronic anticoagulation 07/16/2022   Normocytic  anemia 05/26/2022   History of recurrent deep vein thrombosis (DVT) 05/26/2022   Pre-diabetes 05/23/2022   Pure hypercholesterolemia 05/23/2022   Osteoporosis of femur without pathological fracture 11/15/2021   Overweight (BMI 25.0-29.9) 08/15/2021   Port-A-Cath in place 09/09/2019   Malignant neoplasm of upper-outer quadrant of right breast in female, estrogen receptor positive (HCC) 08/20/2019   Breast cancer metastasized to axillary lymph node, right (HCC) 08/18/2019    Trigger middle finger of left hand 02/10/2019   Radicular leg pain 02/04/2018   H/O bilateral breast implants 12/24/2017   GERD (gastroesophageal reflux disease) 12/06/2015   Gluten intolerance 12/06/2015    PCP: Norene Fielding, DO  REFERRING PROVIDER:  Andris Estefana BRAVO, PA-C  REFERRING DIAG: C50.911,C77.3 (ICD-10-CM) - Breast cancer metastasized to axillary lymph node, right (HCC) C50.411,Z17.0 (ICD-10-CM) - Malignant neoplasm of upper-outer quadrant of right breast in female, estrogen receptor positive (HCC)  THERAPY DIAG:  Stiffness of right shoulder, not elsewhere classified  Localized edema  Aftercare following surgery for neoplasm  Malignant neoplasm of upper-outer quadrant of right breast in female, estrogen receptor positive (HCC)  Muscle weakness (generalized)  ONSET DATE: 03/19/24  Rationale for Evaluation and Treatment: Rehabilitation  SUBJECTIVE:                                                                                                                                                                                           SUBJECTIVE STATEMENT:  The pain is always just there.  If it doesn't get better I may have another surgery.  I find that out next week.    PERTINENT HISTORY: History of R breast cancer. 1.3 cm mass and right axillary node. Biopsy showed grade II IDC that was ER+ , PR-, HER2+. Node positive. She underwent primary chemotherapy followed by right lumpectomy and SLNB 0/4 on 01/15/2020. She completed radiation 02/15/20 and then HER2 therapy. She completed neratinib . She is on an aromatase inhibitor. Hx of LLE DVT following foot surgery for bunion, acute DVT in LUE and in LLE diagnosed on 12/16/20 She had implants placed on 03/19/24.   PAIN:  Are you having pain? Yes NPRS scale: 3/10 Pain location: R axilla Pain orientation: Right  PAIN TYPE: aching and dull Pain description: constant  Aggravating factors: raising arm Relieving factors:  nothing  PRECAUTIONS: Other: R UE lymphedema risk, previous LLE DVT and DVT in LUE  RED FLAGS: None   WEIGHT BEARING RESTRICTIONS: No  FALLS:  Has patient fallen in last 6 months? No  LIVING ENVIRONMENT: Lives with: lives with their spouse Lives in: House/apartment Stairs: Yes; Internal: 14 steps; on right going up Has following equipment at  home: None  OCCUPATION: full time, sells insurance  LEISURE: some exercise - prior to surgery did floor exercises and walked 2-3x/wk but in the summer would do more, this week has started dance aerobics  HAND DOMINANCE: left   PRIOR LEVEL OF FUNCTION: Independent  PATIENT GOALS: to try and decrease pain when raising arm   OBJECTIVE: Note: Objective measures were completed at Evaluation unless otherwise noted.  COGNITION: Overall cognitive status: Within functional limits for tasks assessed   PALPATION: Cording visible in R axilla  OBSERVATIONS / OTHER ASSESSMENTS: R implant is sitting much higher than L, is positioned up on chest, cording visible in R axilla, possible skin indentation along lateral breast due to deep cording  POSTURE: forward head, rounded shoulders  UPPER EXTREMITY AROM/PROM:  A/PROM RIGHT   eval   Shoulder extension 51  Shoulder flexion 143- with pulling  Shoulder abduction 138 with pulling  Shoulder internal rotation 66  Shoulder external rotation 90    (Blank rows = not tested)  A/PROM LEFT   eval  Shoulder extension 60  Shoulder flexion 167  Shoulder abduction 176  Shoulder internal rotation 78  Shoulder external rotation 94    (Blank rows = not tested)  UPPER EXTREMITY STRENGTH: 5/5 but with discomfort on R  LYMPHEDEMA ASSESSMENTS:  SURGERY TYPE/DATE: R lumpectomy and SLNB 01/15/20 NUMBER OF LYMPH NODES REMOVED: 0/4 CHEMOTHERAPY: completed neoadjuvant and adjuvant RADIATION:completed on 02/15/2020 HORMONE TREATMENT: anastrozole  currently INFECTIONS: none  LYMPHEDEMA ASSESSMENTS:    LANDMARK RIGHT  eval  At axilla  31.6  15 cm proximal to the proximal aspect of the olecranon process 31  10 cm proximal to the proximal aspect of the olecranon process 30.3  Olecranon process 25.2  15 cm proximal to the proximal aspect of the ulnar styloid process 22.5  10 cm proximal to the proximal aspect of the ulnar styloid process 20  Just distal to the ulnar styloid process 15.4  Across hand at thumb web space 17.5  At base of 2nd digit 6  (Blank rows = not tested)  LANDMARK LEFT  eval  At axilla  29.5  15 cm proximal to the proximal aspect of the olecranon process 30  10 cm proximal to the proximal aspect of the olecranon process 30  Olecranon process 25.1  15 cm proximal to the proximal aspect of the ulnar styloid process 23.2  10 cm proximal to  the proximal aspect of the ulnar styloid process 21  Just distal to the ulnar styloid process 15  Across hand at thumb web space 18.5  At base of 2nd digit 6  (Blank rows = not tested)  Chest circumference just inferior to the axillae:  Chest circumference at the largest point:                                                                                                                               TREATMENT DATE:  05/06/24: Pulleys into flexion  and abduction (easy) x each with initial instruction Ball roll into flexion 5 x 10, abduction 5 x 6  PROM to R shoulder in to flexion and abduction while performing MFR to cording in R axilla MFR with cocoa butter to axilla and upper arm.   04/30/24: PROM to R shoulder in to flexion and abduction while performing MFR to cording in R axilla and briefly to possible cording along R lateral breast    PATIENT EDUCATION:  Education details: axillary cording and how it is managed Person educated: Patient Education method: Explanation Education comprehension: verbalized understanding  HOME EXERCISE PROGRAM: End range stretching for cording  ASSESSMENT:  CLINICAL  IMPRESSION: Continued work on cording and limited ROM, mainly into flexion.  Cording not apparent today.   OBJECTIVE IMPAIRMENTS: decreased knowledge of condition, decreased ROM, decreased strength, increased fascial restrictions, impaired UE functional use, postural dysfunction, and pain.   ACTIVITY LIMITATIONS: carrying and lifting  PARTICIPATION LIMITATIONS: cleaning and laundry  PERSONAL FACTORS: none are also affecting patient's functional outcome.   REHAB POTENTIAL: Good  CLINICAL DECISION MAKING: Stable/uncomplicated  EVALUATION COMPLEXITY: Low  GOALS: Goals reviewed with patient? Yes  SHORT TERM GOALS=LONG TERM GOALS Target date: 05/28/24  Pt will demonstrate 165 degrees of R shoulder flexion to allow her to raise arm overhead. Baseline: Goal status: INITIAL  2.  Pt will demonstrate 165 degrees of R shoulder abduction to allow her to reach out to the side. Baseline:  Goal status: INITIAL  3.  Pt will report a 75% improvement in pain in R axilla to allow improved comfort and function. Baseline:  Goal status: INITIAL  4.  Pt will be independent in a home exercise program for continued stretching and stretching.  Baseline:  Goal status: INITIAL   PLAN:  PT FREQUENCY: 2x/week  PT DURATION: 4 weeks  PLANNED INTERVENTIONS: 02835- PT Re-evaluation, 97750- Physical Performance Testing, 97110-Therapeutic exercises, 97530- Therapeutic activity, 97112- Neuromuscular re-education, 97535- Self Care, 02859- Manual therapy, [phone]- Orthotic Initial, [phone]- Orthotic/Prosthetic subsequent, Patient/Family education, Balance training, Joint mobilization, Therapeutic exercises, Therapeutic activity, Neuromuscular re-education, Gait training, and Self Care  PLAN FOR NEXT SESSION: pulleys, ball, supine scap, MFR to cording in R axilla and to possible deep cording along lateral trunk and lateral breast, STM to implant  Larue Saddie SAUNDERS, PT 05/06/2024, 8:54 AM  "

## 2024-05-12 ENCOUNTER — Encounter: Admitting: Plastic Surgery

## 2024-05-12 ENCOUNTER — Ambulatory Visit: Admitting: Physical Therapy

## 2024-05-14 ENCOUNTER — Ambulatory Visit: Admitting: Physical Therapy

## 2024-05-19 ENCOUNTER — Ambulatory Visit: Admitting: Rehabilitation

## 2024-05-21 ENCOUNTER — Ambulatory Visit: Admitting: Rehabilitation

## 2024-05-26 ENCOUNTER — Ambulatory Visit: Admitting: Physical Therapy

## 2024-05-28 ENCOUNTER — Ambulatory Visit: Admitting: Physical Therapy

## 2024-06-08 ENCOUNTER — Ambulatory Visit

## 2024-06-26 ENCOUNTER — Ambulatory Visit: Payer: Self-pay | Admitting: Family Medicine

## 2024-08-17 ENCOUNTER — Ambulatory Visit: Admitting: Hematology and Oncology

## 2024-08-17 ENCOUNTER — Other Ambulatory Visit

## 2024-08-17 ENCOUNTER — Ambulatory Visit
